# Patient Record
Sex: Female | Born: 1960 | Race: White | Hispanic: No | Marital: Married
Health system: Southern US, Community
[De-identification: ages and names within clinical notes are randomized; demographics above are authoritative.]

## PROBLEM LIST (undated history)

## (undated) ENCOUNTER — Emergency Department (HOSPITAL_COMMUNITY): Payer: Medicaid - Out of State

## (undated) DIAGNOSIS — F410 Panic disorder [episodic paroxysmal anxiety] without agoraphobia: Secondary | ICD-10-CM

## (undated) DIAGNOSIS — G8929 Other chronic pain: Secondary | ICD-10-CM

## (undated) DIAGNOSIS — I10 Essential (primary) hypertension: Secondary | ICD-10-CM

## (undated) DIAGNOSIS — E785 Hyperlipidemia, unspecified: Secondary | ICD-10-CM

## (undated) DIAGNOSIS — J45909 Unspecified asthma, uncomplicated: Secondary | ICD-10-CM

## (undated) DIAGNOSIS — E119 Type 2 diabetes mellitus without complications: Secondary | ICD-10-CM

## (undated) DIAGNOSIS — M549 Dorsalgia, unspecified: Secondary | ICD-10-CM

## (undated) DIAGNOSIS — F419 Anxiety disorder, unspecified: Secondary | ICD-10-CM

## (undated) DIAGNOSIS — R079 Chest pain, unspecified: Secondary | ICD-10-CM

## (undated) DIAGNOSIS — K589 Irritable bowel syndrome without diarrhea: Secondary | ICD-10-CM

## (undated) DIAGNOSIS — F319 Bipolar disorder, unspecified: Secondary | ICD-10-CM

## (undated) DIAGNOSIS — IMO0001 Reserved for inherently not codable concepts without codable children: Secondary | ICD-10-CM

## (undated) DIAGNOSIS — K219 Gastro-esophageal reflux disease without esophagitis: Secondary | ICD-10-CM

## (undated) DIAGNOSIS — K3184 Gastroparesis: Secondary | ICD-10-CM

## (undated) DIAGNOSIS — I4891 Unspecified atrial fibrillation: Secondary | ICD-10-CM

## (undated) HISTORY — PX: MOUTH SURGERY: SHX715

---

## 1997-09-16 HISTORY — PX: RIGHT OOPHORECTOMY: SHX2359

## 1999-09-17 HISTORY — PX: ABDOMINAL HYSTERECTOMY: SHX81

## 2004-09-07 ENCOUNTER — Encounter (INDEPENDENT_AMBULATORY_CARE_PROVIDER_SITE_OTHER): Payer: Self-pay | Admitting: *Deleted

## 2005-05-01 ENCOUNTER — Ambulatory Visit: Payer: Self-pay | Admitting: *Deleted

## 2005-05-01 ENCOUNTER — Encounter (INDEPENDENT_AMBULATORY_CARE_PROVIDER_SITE_OTHER): Payer: Self-pay | Admitting: *Deleted

## 2005-05-09 ENCOUNTER — Ambulatory Visit: Payer: Self-pay | Admitting: *Deleted

## 2005-05-09 ENCOUNTER — Encounter (HOSPITAL_COMMUNITY): Admission: RE | Admit: 2005-05-09 | Discharge: 2005-05-10 | Payer: Self-pay | Admitting: *Deleted

## 2005-05-16 ENCOUNTER — Ambulatory Visit: Payer: Self-pay | Admitting: *Deleted

## 2005-09-16 HISTORY — PX: COLONOSCOPY: SHX174

## 2005-10-17 HISTORY — PX: APPENDECTOMY: SHX54

## 2005-10-17 HISTORY — PX: CHOLECYSTECTOMY: SHX55

## 2005-10-24 ENCOUNTER — Emergency Department (HOSPITAL_COMMUNITY): Admission: EM | Admit: 2005-10-24 | Discharge: 2005-10-24 | Payer: Self-pay | Admitting: Emergency Medicine

## 2005-10-26 ENCOUNTER — Inpatient Hospital Stay (HOSPITAL_COMMUNITY): Admission: EM | Admit: 2005-10-26 | Discharge: 2005-10-30 | Payer: Self-pay | Admitting: Emergency Medicine

## 2005-10-26 ENCOUNTER — Ambulatory Visit: Payer: Self-pay | Admitting: *Deleted

## 2005-10-29 ENCOUNTER — Encounter (INDEPENDENT_AMBULATORY_CARE_PROVIDER_SITE_OTHER): Payer: Self-pay | Admitting: General Surgery

## 2006-01-05 ENCOUNTER — Observation Stay (HOSPITAL_COMMUNITY): Admission: EM | Admit: 2006-01-05 | Discharge: 2006-01-07 | Payer: Self-pay | Admitting: Emergency Medicine

## 2006-02-04 ENCOUNTER — Emergency Department (HOSPITAL_COMMUNITY): Admission: EM | Admit: 2006-02-04 | Discharge: 2006-02-04 | Payer: Self-pay | Admitting: Psychology

## 2006-02-27 ENCOUNTER — Observation Stay (HOSPITAL_COMMUNITY): Admission: EM | Admit: 2006-02-27 | Discharge: 2006-03-01 | Payer: Self-pay | Admitting: Emergency Medicine

## 2006-02-28 ENCOUNTER — Ambulatory Visit: Payer: Self-pay | Admitting: Internal Medicine

## 2006-02-28 HISTORY — PX: ESOPHAGOGASTRODUODENOSCOPY: SHX1529

## 2006-03-20 ENCOUNTER — Ambulatory Visit: Payer: Self-pay | Admitting: Family Medicine

## 2006-03-21 ENCOUNTER — Emergency Department (HOSPITAL_COMMUNITY): Admission: EM | Admit: 2006-03-21 | Discharge: 2006-03-21 | Payer: Self-pay | Admitting: Emergency Medicine

## 2006-03-31 ENCOUNTER — Ambulatory Visit: Payer: Self-pay | Admitting: Family Medicine

## 2006-04-21 ENCOUNTER — Emergency Department (HOSPITAL_COMMUNITY): Admission: EM | Admit: 2006-04-21 | Discharge: 2006-04-21 | Payer: Self-pay | Admitting: Emergency Medicine

## 2006-05-29 ENCOUNTER — Emergency Department (HOSPITAL_COMMUNITY): Admission: EM | Admit: 2006-05-29 | Discharge: 2006-05-29 | Payer: Self-pay | Admitting: Emergency Medicine

## 2006-08-02 ENCOUNTER — Emergency Department (HOSPITAL_COMMUNITY): Admission: EM | Admit: 2006-08-02 | Discharge: 2006-08-02 | Payer: Self-pay | Admitting: Emergency Medicine

## 2006-09-12 ENCOUNTER — Emergency Department (HOSPITAL_COMMUNITY): Admission: EM | Admit: 2006-09-12 | Discharge: 2006-09-12 | Payer: Self-pay | Admitting: Emergency Medicine

## 2006-09-14 ENCOUNTER — Emergency Department (HOSPITAL_COMMUNITY): Admission: EM | Admit: 2006-09-14 | Discharge: 2006-09-14 | Payer: Self-pay | Admitting: Emergency Medicine

## 2006-10-21 ENCOUNTER — Encounter: Payer: Self-pay | Admitting: Family Medicine

## 2006-10-21 DIAGNOSIS — E782 Mixed hyperlipidemia: Secondary | ICD-10-CM | POA: Insufficient documentation

## 2006-10-21 DIAGNOSIS — F329 Major depressive disorder, single episode, unspecified: Secondary | ICD-10-CM

## 2006-10-21 DIAGNOSIS — M545 Low back pain, unspecified: Secondary | ICD-10-CM | POA: Insufficient documentation

## 2006-10-21 DIAGNOSIS — G43909 Migraine, unspecified, not intractable, without status migrainosus: Secondary | ICD-10-CM | POA: Insufficient documentation

## 2006-10-21 DIAGNOSIS — F319 Bipolar disorder, unspecified: Secondary | ICD-10-CM | POA: Insufficient documentation

## 2006-10-21 DIAGNOSIS — F3289 Other specified depressive episodes: Secondary | ICD-10-CM | POA: Insufficient documentation

## 2006-10-21 DIAGNOSIS — I10 Essential (primary) hypertension: Secondary | ICD-10-CM | POA: Insufficient documentation

## 2006-10-21 DIAGNOSIS — I2510 Atherosclerotic heart disease of native coronary artery without angina pectoris: Secondary | ICD-10-CM | POA: Insufficient documentation

## 2006-10-21 DIAGNOSIS — K219 Gastro-esophageal reflux disease without esophagitis: Secondary | ICD-10-CM | POA: Insufficient documentation

## 2006-10-21 DIAGNOSIS — E785 Hyperlipidemia, unspecified: Secondary | ICD-10-CM | POA: Insufficient documentation

## 2006-10-21 DIAGNOSIS — D131 Benign neoplasm of stomach: Secondary | ICD-10-CM | POA: Insufficient documentation

## 2006-12-11 ENCOUNTER — Emergency Department (HOSPITAL_COMMUNITY): Admission: EM | Admit: 2006-12-11 | Discharge: 2006-12-11 | Payer: Self-pay | Admitting: Emergency Medicine

## 2007-01-09 ENCOUNTER — Emergency Department (HOSPITAL_COMMUNITY): Admission: EM | Admit: 2007-01-09 | Discharge: 2007-01-10 | Payer: Self-pay | Admitting: Emergency Medicine

## 2007-01-19 ENCOUNTER — Emergency Department (HOSPITAL_COMMUNITY): Admission: EM | Admit: 2007-01-19 | Discharge: 2007-01-20 | Payer: Self-pay | Admitting: Emergency Medicine

## 2007-03-17 ENCOUNTER — Emergency Department (HOSPITAL_COMMUNITY): Admission: EM | Admit: 2007-03-17 | Discharge: 2007-03-17 | Payer: Self-pay | Admitting: Emergency Medicine

## 2007-05-04 ENCOUNTER — Emergency Department (HOSPITAL_COMMUNITY): Admission: EM | Admit: 2007-05-04 | Discharge: 2007-05-04 | Payer: Self-pay | Admitting: Emergency Medicine

## 2007-12-04 ENCOUNTER — Emergency Department (HOSPITAL_COMMUNITY): Admission: EM | Admit: 2007-12-04 | Discharge: 2007-12-04 | Payer: Self-pay | Admitting: Emergency Medicine

## 2008-01-11 ENCOUNTER — Emergency Department (HOSPITAL_COMMUNITY): Admission: EM | Admit: 2008-01-11 | Discharge: 2008-01-11 | Payer: Self-pay | Admitting: Emergency Medicine

## 2008-01-11 IMAGING — CR DG ELBOW COMPLETE 3+V*L*
4 series · 4 of 4 positions shown · non-contrast
Comparison: none

CLINICAL DATA: Fall with elbow pain.
 LEFT ELBOW ? 4 VIEW:

[view not recorded (1 of 4)]
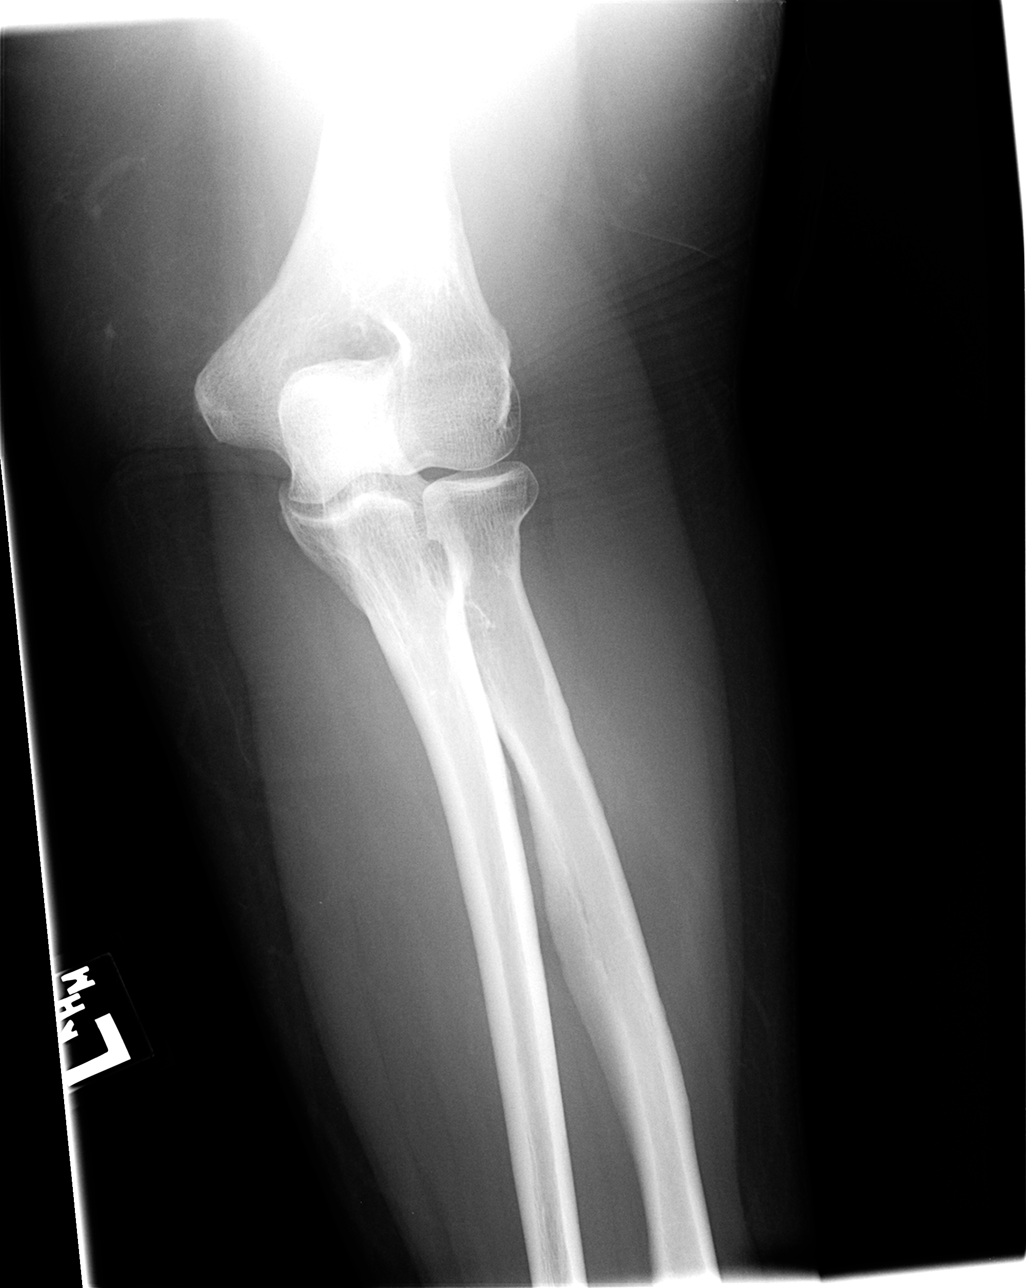

[view not recorded (2 of 4)]
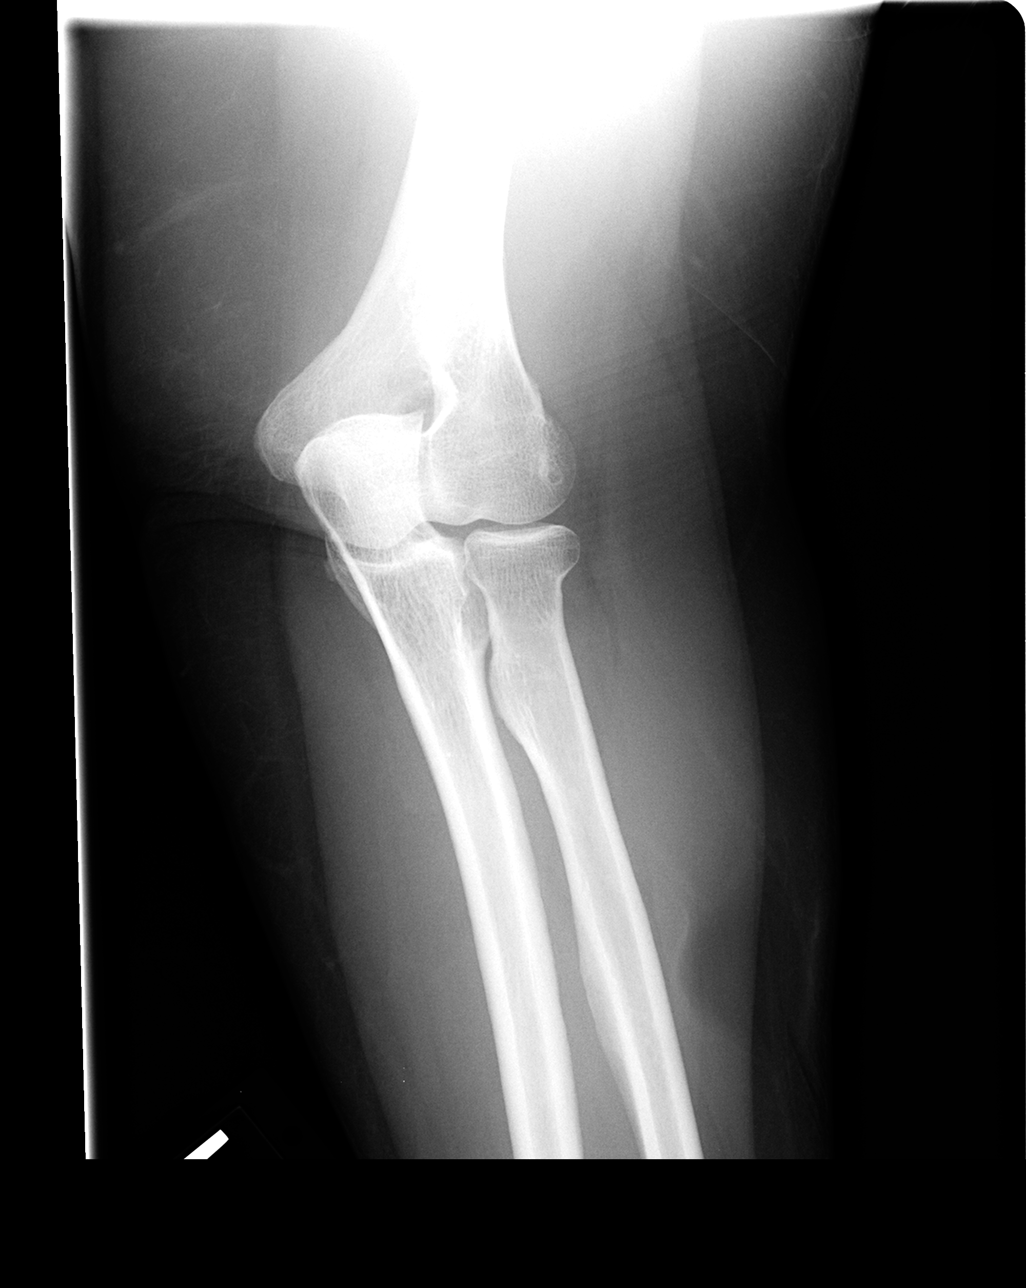

[view not recorded (3 of 4)]
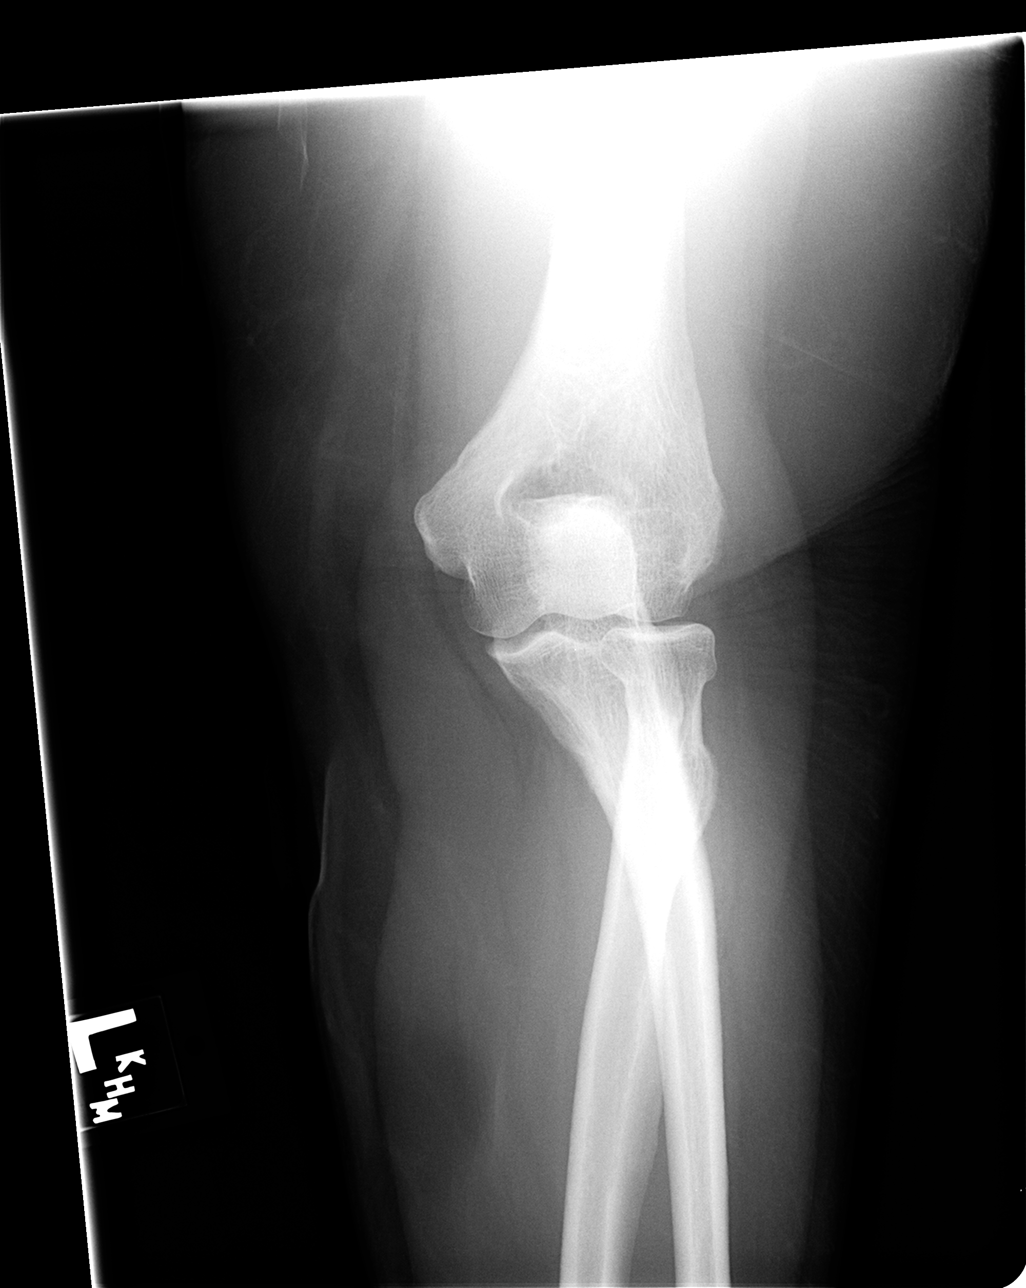

[view not recorded (4 of 4)]
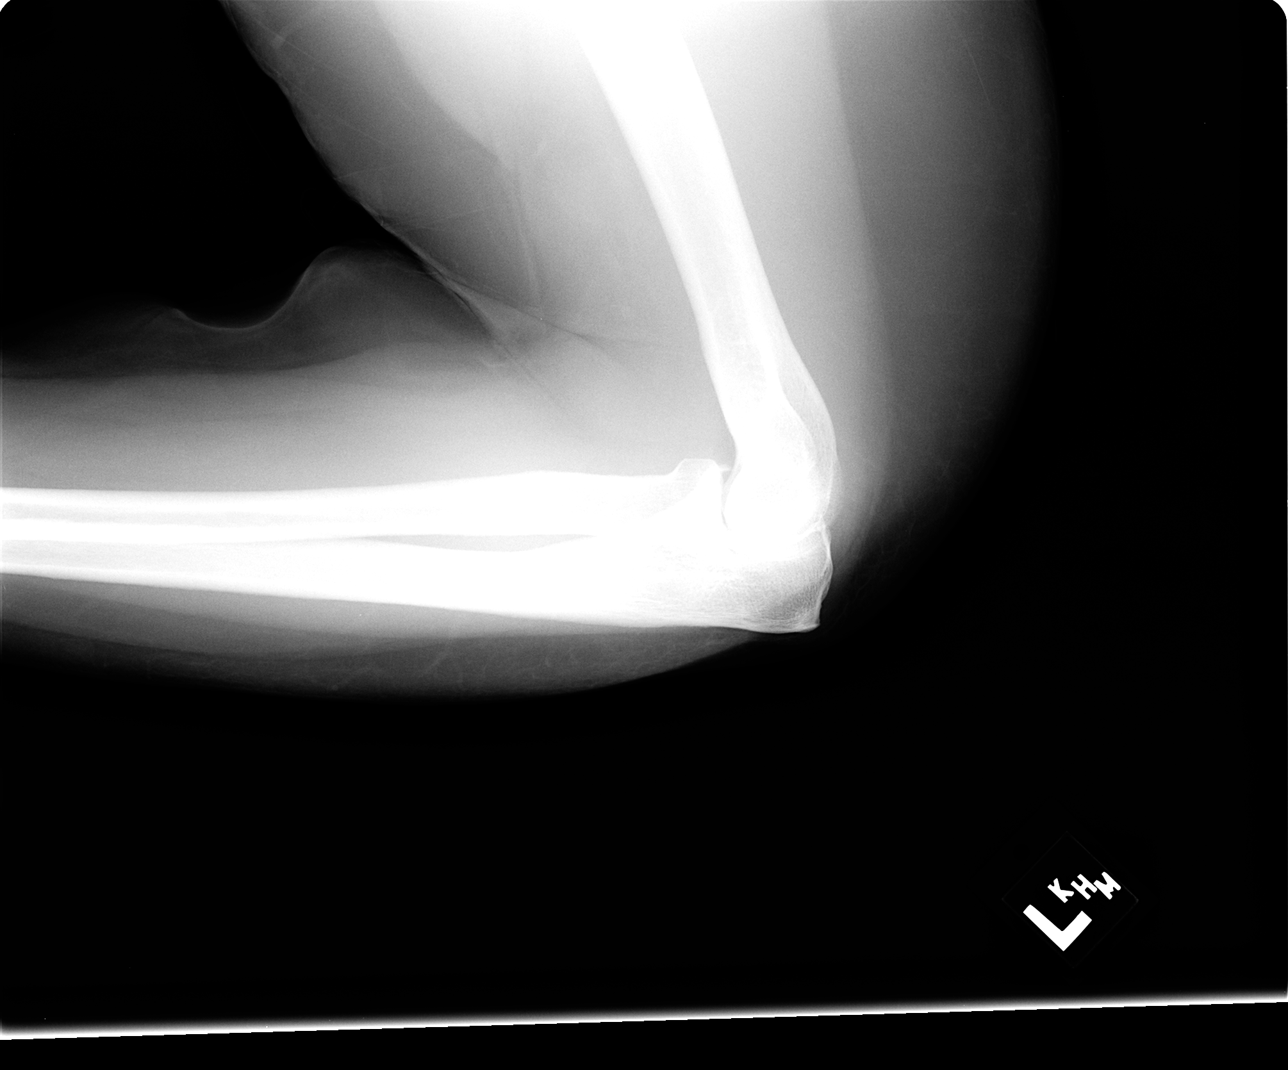

[4 of 4 positions shown; findings below may reference images not displayed]

FINDINGS: There is no evidence of fracture, dislocation, or joint effusion.  There is no evidence of arthropathy or other focal bone abnormality.  Soft tissues are unremarkable.
IMPRESSION: Negative.

## 2008-02-27 ENCOUNTER — Emergency Department (HOSPITAL_COMMUNITY): Admission: EM | Admit: 2008-02-27 | Discharge: 2008-02-27 | Payer: Self-pay | Admitting: Emergency Medicine

## 2010-06-10 ENCOUNTER — Emergency Department (HOSPITAL_COMMUNITY): Admission: EM | Admit: 2010-06-10 | Discharge: 2010-06-10 | Payer: Self-pay | Admitting: Emergency Medicine

## 2010-10-07 ENCOUNTER — Encounter: Payer: Self-pay | Admitting: Family Medicine

## 2010-11-29 LAB — COMPREHENSIVE METABOLIC PANEL
AST: 11 U/L (ref 0–37)
Albumin: 3.4 g/dL — ABNORMAL LOW (ref 3.5–5.2)
Calcium: 8.6 mg/dL (ref 8.4–10.5)
Chloride: 99 mEq/L (ref 96–112)
Creatinine, Ser: 0.92 mg/dL (ref 0.4–1.2)
GFR calc Af Amer: >60 mL/min (ref >60)

## 2010-11-29 LAB — CK TOTAL AND CKMB (NOT AT ARMC)
CK, MB: 0.5 ng/mL (ref 0.3–4.0)
Relative Index: INVALID (ref 0.0–2.5)

## 2010-11-29 LAB — CBC
Hemoglobin: 10.6 g/dL — ABNORMAL LOW (ref 12.0–15.0)
MCV: 82.8 fL (ref 78.0–100.0)
Platelets: 228 10*3/uL (ref 150–400)
RBC: 3.86 MIL/uL — ABNORMAL LOW (ref 3.87–5.11)
WBC: 5.2 10*3/uL (ref 4.0–10.5)

## 2011-02-01 NOTE — Discharge Summary (Signed)
NAMESHAMBRIA, [name]                   ACCOUNT NO.:  [id number]   MEDICAL RECORD NO.:  [id number]          PATIENT TYPE:  OBV   LOCATION:  A226                          FACILITY:  [facility]   PHYSICIAN:  Osvaldo Shipper, MD     DATE OF BIRTH:  1961-06-19   DATE OF ADMISSION:  01/05/2006  DATE OF DISCHARGE:  04/24/2007LH                                 DISCHARGE SUMMARY   The patient goes to [facility].   DISCHARGE DIAGNOSES:  1.  Chest pain likely secondary to acid reflux disease versus      musculoskeletal.  2.  Syncopal episode likely related to medication use.  3.  Bipolar disorder, stable.  4.  Headaches, stable.   HISTORY OF PRESENT ILLNESS:  Please see the H&P dictated at the time of  admission for details regarding the patient's presenting illness.   BRIEF HOSPITAL COURSE:  1.  Briefly, this is a 50 year old Caucasian female who has history of      bipolar disorder and a strong family history of premature coronary      artery disease who has been having chest pain on and off for at least      the past 9 months.  She had a stress test back in August 2006 which was      unremarkable.  The patient subsequently presented to [facility] about 2 weeks ago with chest pain and because of her      concerning presentation and her family history she actually underwent      cardiac catheterization which did not reveal any coronary artery      disease.  The patient's echocardiogram was also unremarkable.  The      patient had continued to have chest pain since then.  About 1 day prior      to admission here she had two episodes of syncopal episodes.  Her EKG      actually showed Q waves in the inferior leads.  A D-dimer was checked      which was negative.  However, because of her recent negative cardiac      catheterization it was thought that the patient would benefit from      getting a CT of chest with contrast to rule out any other  abnormalities      such as a PE or any major vascular abnormalities.  CT chest was done      which was again unremarkable.  In a sense, the patient has had an      extensive work up for her chest pain which has not revealed any      conclusive etiology.  She does have history of GERD.  She also says that      she may have fallen down on her chest when she had the syncopal      episodes.  Hence, these etiologies need to be pursued further.  I am      going to refer the patient to Dr. Karilyn Cota to consider endoscopy on  this      patient.  She may also benefit from esophageal manometrics.  2.  Syncopal episodes.  The patient was admitted to telemetry.  For the past      2 days there have been no arrhythmic evidence noted on the telemetry.      She has ruled out for acute coronary syndrome.  Her CT head was also      quite unremarkable for any acute event.  Carotid Dopplers did not show      any significant stenosis.  It was thought this could be related to      medication use versus seizures.  Arrhythmic event is still a      possibility.  The patient was seen by Dr. Gerilyn Pilgrim who recommends      outpatient EEG.  We will also discontinue the patient's metoprolol at      this time.  I am going to refer her to [facility] to get an      event monitor.  3.  Headache, possibly related to her fall during which she sustained      laceration to her left forehead.  This was sutured in the [facility] ED.      CT has not revealed any acute intracranial or bony abnormalities.  Pain      is being controlled with Vicodin.  4.  Bipolar disorder has been stable.  She has an appointment with her      psychiatrist later today.  I have asked her to discuss with the      psychiatrist if the dosage of any of her medications can be reduced in      view of her syncopal episodes.   On the day of discharge the patient still has a mild headache but no chest  pain.  Her vital signs are all stable.  As mentioned  above telemetry did not  show any arrhythmic events.  Her blood work shows stability in her anemia  and no other abnormalities.  Based on all the above the patient is  considered stable for discharge.   DISCHARGE MEDICATIONS:  I will discontinue metoprolol.  I am starting her on  Nu-Iron 150 b.i.d.  Otherwise she may resume her other medications as  before.   FOLLOW UP:  1.  Followup with Dr. Gerilyn Pilgrim in 2 weeks for an EEG.  2.  With Dr. Karilyn Cota to consider endoscopy.  3.  [facility] for event monitor.   DISCHARGE DIET:  Heart healthy diet.   DISCHARGE ACTIVITIES:  Physical activity, no restrictions.   The patient is asked to return to [facility] ED per their instructions for  suture removal.   Apart from the imaging studies mentioned above the patient also had a lower  extremity venous Dopplers which did not reveal a DVT.  She also had x-rays  of her elbow which was negative for any fracture or dislocation.   CONSULTANTS:  Obtained from Dr. Gerilyn Pilgrim.      Osvaldo Shipper, MD  Electronically Signed     GK/MEDQ  D:  01/07/2006  T:  01/07/2006  Job:  [id number]   cc:   Darleen Crocker A. Gerilyn Pilgrim, M.D.  Fax: [phone]   Lionel December, M.D.  [address]  [place]  [place] [address]   Country Walk Bing, M.D. [facility]  [address]  [address]  [place]  [place] [address]

## 2011-02-01 NOTE — Op Note (Signed)
[name], [name]                   ACCOUNT NO.:  [id number]   MEDICAL RECORD NO.:  [id number]          PATIENT TYPE:  INP   LOCATION:  A217                          FACILITY:  [facility]   PHYSICIAN:  Dirk Dress. Katrinka Blazing, M.D.   DATE OF BIRTH:  08/12/1961   DATE OF PROCEDURE:  10/29/2005  DATE OF DISCHARGE:  10/30/2005                                 OPERATIVE REPORT   PREOPERATIVE DIAGNOSIS:  Acalculous cholecystitis, chronic right lower  quadrant pain.   POSTOPERATIVE DIAGNOSIS:  Acalculous cholecystitis, chronic right lower  quadrant pain, pelvic adhesions.   PROCEDURE:  Laparoscopic cholecystectomy, pelvic adhesiolysis, laparoscopic  appendectomy.   SURGEON:  Dr. Katrinka Blazing.   DESCRIPTION:  Under general anesthesia the patient's abdomen was prepped and  draped in sterile field.  Supraumbilical incision was made and Veress needle  was inserted uneventfully.  Abdomen was insufflated with 2.5 liters of CO2.  Using an Visiport guide, a 10 mm port was placed.  Laparoscope was placed  and the gallbladder was visualized.  Under videoscopic guidance a 10-mm port  and two 5-mm ports were placed in the right subcostal region.  The patient  was placed in deep Trendelenburg position.  Evaluation of the pelvis  revealed extensive pelvic adhesions and revealed the appendix that was  slightly enlarged with increased vascularity.  It was elected to do a  cholecystectomy and appendectomy.  The patient was placed back in reverse  Trendelenburg position.  Gallbladder was grasped and positioned.  Cystic  duct was dissected, clipped with five clips and divided.  Cystic artery was  dissected, clipped with three clips on each branch and divided.  Gallbladder  was separated from the infrahepatic space without difficulty.  It was placed  in an EndoCatch device and retrieved.  Irrigation was carried out.  There  was no bleeding or bile leak.  The patient was then placed in deep  Trendelenburg position.  Suprapubic  incision was made and 5 mm port was  placed.  Using the camera in the right upper paramedian incision, dissection  was carried out through the umbilicus and the suprapubic port.  Adhesiolysis  was carried out using Endoshears.  Once this was done the pelvis was freed  and no other abnormality was noted.  The appendix was isolated.  It was  grasped with a clamp.  12 mm port was placed in the left lower quadrant.  The base of the appendix was dissected and transected using an Endo-GIA.  The mesoappendix was dissected and clipped with multiple clips.  The  appendix was placed in an EndoCatch device and retrieved.  Irrigation was  carried out.  The fluid returned clear.  The subhepatic incision was  inspected.  All areas appeared to be free of any drainage.  CO2 was allowed  to escape from the abdomen and ports were removed.  The port at the  umbilicus and the port in left lower quadrant had the fascia closed with 0  Vicryl.  All skin incisions were closed with staples.  The patient tolerated  the procedure well.  She was  awakened from anesthesia.  Dressings were  placed.  She was awakened from anesthesia uneventfully, transferred to a bed  and taken to the postanesthetic care unit for monitoring.      Dirk Dress. Katrinka Blazing, M.D.  Electronically Signed     LCS/MEDQ  D:  02/01/2006  T:  02/02/2006  Job:  [id number]

## 2011-02-01 NOTE — Procedures (Signed)
[name], [name]                   ACCOUNT NO.:  [id number]   MEDICAL RECORD NO.:  [id number]          PATIENT TYPE:  INP   LOCATION:  A217                          FACILITY:  [facility]   PHYSICIAN:  Vida Roller, M.D.   DATE OF BIRTH:  02-24-1961   DATE OF PROCEDURE:  10/28/2005  DATE OF DISCHARGE:                                  ECHOCARDIOGRAM   TAPE NUMBER:  [id number]   TAPE COUNT:  [id number]   HISTORY OF PRESENT ILLNESS:  This is a 50 year old woman with chest  discomfort.  Technical quality of this study is adequate.   M-MODE TRACINGS:  The aorta is 26 mm.   Left atrium is 44 mm.   Septum is 10 mm.   Posterior wall is 10 mm.   Left ventricular diastolic dimension 46 mm.   Left ventricular systolic dimension 33 mm.   A 2D AND DOPPLER IMAGING:  Left ventricle is normal size.  There is  preserved LV systolic function.  Estimated ejection fraction is 60-65%.  No  wall motion abnormality is seen.   Right ventricle is top normal size with preserved RV systolic function.   Both atria are dilated.   Aortic valve is morphologically unremarkable with no stenosis or  regurgitation.   The mitral valve has trace regurgitation.   The tricuspid valve has mild regurgitation.   There is no pericardial effusion.      Vida Roller, M.D.  Electronically Signed     JH/MEDQ  D:  10/28/2005  T:  10/29/2005  Job:  [id number]

## 2011-02-01 NOTE — Procedures (Signed)
[name], [name]                   ACCOUNT NO.:  [id number]   MEDICAL RECORD NO.:  [id number]          PATIENT TYPE:  REC   LOCATION:                                FACILITY:  [facility]   PHYSICIAN:  Vida Roller, M.D.   DATE OF BIRTH:  September 20, 1960   DATE OF PROCEDURE:  DATE OF DISCHARGE:                                    STRESS TEST   This is a 50 year old female with no known coronary disease with atypical  chest discomfort. Cardiac risk factors include strong family history,  hypertension, and hyperlipidemia.   BASELINE DATA:  Electrocardiogram reveals a sinus rhythm at 94 beats per  minute with nonspecific ST abnormalities. Some nondiagnostic Q waves in the  inferolateral leads, and blood pressure is 110/62.   The patient exercised for a total of 3 minutes and 32 seconds and Bruce  protocol stage 1 to 4.6 METS. Maximum heart rate was 173 beats per minute  which is 97% of predicted maximum. Maximum blood pressure is 188/60. The  patient has complained of some shortness of breath. She had some mild  dyspnea at the end of exercise after the treadmill was stopped. This  resolved in recovery. Her EKG revealed few PACs. No ischemic changes were  noted. Exercise was stopped secondary to fatigue.   Final images and results are pending M.D. review.      Jae Dire, P.A. [facility]      Vida Roller, M.D.  Electronically Signed    AB/MEDQ  D:  05/09/2005  T:  05/09/2005  Job:  [id number]

## 2011-02-01 NOTE — Discharge Summary (Signed)
NAMECHARLA, [name]                   ACCOUNT NO.:  [id number]   MEDICAL RECORD NO.:  [id number]          PATIENT TYPE:  INP   LOCATION:  A217                          FACILITY:  [facility]   PHYSICIAN:  Dirk Dress. Katrinka Blazing, M.D.   DATE OF BIRTH:  14-Oct-1960   DATE OF ADMISSION:  10/26/2005  DATE OF DISCHARGE:  02/14/2007LH                                 DISCHARGE SUMMARY   HISTORY OF PRESENT ILLNESS:  A 50 year old female admitted for evaluation of  recurrent abdominal pain, nausea, and vomiting.  She gives a history of  acute onset of nausea with diarrhea on February 3.  The pain was in the  right upper quadrant.  She was seen in the emergency room at [facility] on February 5, treated with IV fluids and analgesics.  She  never improved.  She was seen by her family physician at [facility] who treated her symptomatically.  She had a CT scan and ultrasound at  [facility] but was told the results of these were negative.  She continued to be symptomatic so she came to our emergency room.  When  seen in the emergency room she was noted to be in severe pain radiating  through to her back.  She was treated symptomatically and released.  She  continued to have pain with nausea, vomiting and was seen on the day of  admission where a CT was done and this was negative.  Repeat ultrasound was  negative.  She was admitted because she appeared to be in severe distress  and she appeared to have nausea with vomiting.  She gives a history that her  pain is always worse with meals.  History is positive for hypertension,  hyperlipidemia, bipolar disorder and history of atypical chest pain with  negative stress test.  On examination she had a very flat affect.  Vital  signs were stable.  Temperature was 97.7.  She was not jaundiced.  Chest was  clear.  Heart was regular.  Abdomen was mildly distended with exquisite  tenderness in the right subcostal region with tenderness  in the right lower  quadrant at the level of the iliac crest.  It was felt that the patient had  symptoms suggestive of severe biliary kinesia or acalculous cholecystitis  even though her studies were negative.  It was felt that a Hiatus scan  should be done.  The hiatus scan was positive with ejection fraction of  24.2%.  The patient was scheduled for laparoscopic evaluation.  She  underwent diagnostic laparoscopy with pelvic adhesiolysis, appendectomy and  cholecystectomy.  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  The patient  did exceptionally well in the postoperative period.  All of her symptoms  resolved.  She had no abdominal pain and no nausea or vomiting.  And she  tolerated a regular diet.  She was discharged on the morning of the first  postoperative day in satisfactory condition.      Dirk Dress. Katrinka Blazing, M.D.  Electronically  Signed     LCS/MEDQ  D:  12/15/2005  T:  12/16/2005  Job:  [id number]   cc:   Bethena Midget, M.D., [facility]

## 2011-02-01 NOTE — Op Note (Signed)
[name], [name]                   ACCOUNT NO.:  [id number]   MEDICAL RECORD NO.:  [id number]          PATIENT TYPE:  INP   LOCATION:  A214                          FACILITY:  [facility]   PHYSICIAN:  Lionel December, M.D.    DATE OF BIRTH:  1961/02/07   DATE OF PROCEDURE:  02/28/2006  DATE OF DISCHARGE:                                 OPERATIVE REPORT   PROCEDURE:  Esophagogastroduodenoscopy with placement of Bravo device for pH  study.   INDICATIONS:  [name] is a 50 year old Caucasian female with recurrent chest  pain. Noninvasive cardiac studies have been negative.  She has GERD and is  on Nexium which has not help with this pain.  She is undergoing diagnostic  EGD.  If her esophageal mucosa is normal, a Bravo device will be placed for  pH study.  The procedure risks were reviewed with the patient and informed  consent was obtained.   MEDS FOR CONSCIOUS SEDATION:  Benzocaine spray pharyngeal topical  anesthesia, Demerol 50 mg IV, Versed 7 mg IV.   FINDINGS:  The procedure was performed in the endoscopy suite.  The  patient's vital signs and O2 sat were monitored during the procedure and  remained stable.  The patient was placed in the left lateral position.  The  Olympus videoscope was passed via oropharynx without any difficulty into  esophagus.  Mucosa of the pharynx was very dry.   Esophagus:  The mucosa of the esophagus was normal.  The GE junction was at  39 cm from the incisors and was unremarkable.   Stomach:  The stomach had a scant amount of liquid in it.  It distended very  well with insufflation.  There was a 10-15 mL polyp at the gastric body  along the posterior wall with erosion.  There was a cluster of polyps at the  antrum.  Endoscopically, these polyps appeared to be hyperplastic and were  not removed and/or biopsied.  The pyloric channel was patent.  Angularis,  fundus and cardia were examined by retroflexing the scope and were normal.   Duodenum:  The bulbar mucosa  was normal.  The scope was passed second part  of duodenum where mucosa and folds were normal.   The endoscope was withdrawn.  The Bravo device was already loaded onto a  delivery catheter.  It was calibrated and passed blindly via oropharynx and  esophagus.  It was connected to a suction device for 30 seconds.  The  plunger was pushed to secure the device in the esophageal mucosa.  The  delivery catheter was removed.  We determined that the device was placed  more distal than it should have been.  The endoscope was passed again and  the device was at the distal esophagus and the proximal margin was just  proximal to the GE junction.  Therefore, it was decided to place another  device.  I tried to shape the device for the scope tip and the a snare but  it would not fall off, therefore, it was left alone.  The endoscope was  withdrawn.  A second Bravo device placed at 33 cm from the incisors.  This  was deployed in a similar fashion.  As the delivery catheter was withdrawn,  the endoscope was passed again and this device was in good position above  the other device.  The endoscope was withdrawn.  The patient tolerated the  procedure well.   FINAL DIAGNOSIS:  1.  No endoscopic evidence of esophagitis.  2.  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  3.  Normal examination of the bulb and postbulbar duodenum.  4.  The first Bravo device was inadvertently placed in the distal esophagus.      Therefore, a second device was placed 6 cm proximal to the GE junction.   RECOMMENDATIONS:  1.  She will resume her usual meds including PPI.  2.  We will advance the diet to a 4 grams sodium diet.  3.  The patient will keep symptom diary and will bring the devices back to      this facility on March 03, 2006.      Lionel December, M.D.  Electronically Signed     NR/MEDQ  D:  02/28/2006  T:  02/28/2006  Job:   [id number]   cc:   Hollice Espy, M.D.

## 2011-02-01 NOTE — H&P (Signed)
NAMEVAEDA, [name]                   ACCOUNT NO.:  [id number]   MEDICAL RECORD NO.:  [id number]          PATIENT TYPE:  INP   LOCATION:  A214                          FACILITY:  [facility]   PHYSICIAN:  Lonia Blood, M.D.      DATE OF BIRTH:  April 06, 1961   DATE OF ADMISSION:  02/27/2006  DATE OF DISCHARGE:  LH                                HISTORY & PHYSICAL   PRIMARY CARE PHYSICIAN:  Theone Stanley, M.D. at [facility].   CHIEF COMPLAINT:  Retrosternal chest pain.   HISTORY OF PRESENT ILLNESS:  The patient is a 50 year old white female with  recurrent admissions and hospital visits almost five times this year alone,  complaining mainly of chest pain all the time.  She has had significant  family history for heart disease and also has risk factors for heart  disease, however, she has had workup before multiple times including a  cardiac catheterization that was essentially negative for coronary artery  disease.  She was last in the hospital on January 05, 2006, to January 07, 2006.  At that time she had a syncopal episode.  She had MI ruled out and also PE  ruled out.  The patient was asked to follow up with Lionel December, M.D. for  possible EGD since she has acid reflux disease.  The patient, however, has  not followed up with him.  Since then she has been to the ED twice with  chest pain.  This episode started today.  There was sharp, but more  pressure, she said in the anterior part of the chest, radiating to her back.  She has had some nausea associated with it, but no diaphoresis.  She has not  vomited.  The patient has been less active, although, she takes care of her  ailing husband.   PAST MEDICAL HISTORY:  Significant history of chest pain which is recurrent  of unknown etiology.  History of hypertension, dyslipidemia, obesity,  bipolar disorder, gastroesophageal reflux disease, history of syncopal  episode in April of this year.   ALLERGIES:  CODEINE, IBUPROFEN  causes GI intolerance and itching.   PAST SURGICAL HISTORY:  Hysterectomy, right oophorectomy for tubal ligation.  Recent exploratory laparotomy in February of 2007 at which point she had  laparoscopic cholecystectomy by Dirk Dress. Katrinka Blazing, M.D.   SOCIAL HISTORY:  The patient lives in [place].  She is married.  She is  disabled and is primary caregiver for her husband.  No history of tobacco or  alcohol use.  No IV drug use.   FAMILY HISTORY:  Significant for her father dying from coronary artery  disease.  He started having problems at the age of 46 and died at the age of  66 from a massive heart attack.  One of her brothers has coronary artery  disease and is status post CABG.  Also history of diabetes and CVA in the  family.  Mother has significant reflux disease with multiple EGD's done.  One of her sisters had to have esophageal dilatation performed.  Other  family members have extensive coronary artery disease history.   REVIEW OF SYSTEMS:  A 12-point review of systems is performed and is mainly  per HPI.   PHYSICAL EXAMINATION:  VITAL SIGNS:  The patient was afebrile.  Temperature  98, blood pressure initially 152/80, pulse 71, respiratory rate 18,  saturations 100% on room air.  GENERAL:  The patient is obese, awake, alert, oriented, in no acute  distress.  HEENT:  PERRL, EOMI.  NECK:  Supple, no JVD and no lymphadenopathy.  LUNGS:  She has good air entry bilaterally.  No wheezes or rales.  HEART:  Regular rate and rhythm.  ABDOMEN:  Obese, soft, and nontender with positive bowel sounds.  EXTREMITIES:  No cyanosis, clubbing, or edema.   LABORATORY DATA:  White count 5.1, hemoglobin 11.3, MCV 84.8.  Platelet  count 254 with normal differential.  Initial cardiac enzymes were all  negative.  Sodium 139, potassium 3.5, chloride 103, CO2 27, glucose 132, BUN  9, creatinine 1.0, calcium 8.9, total protein 6.7, albumin 3.4, AST 34, ALT  30, alkaline phosphatase 55, total bilirubin  0.4.  Her BMP is less than 30.  EKG is essentially unchanged from previous EKG.   Chest x-ray is also negative for any acute disease.   ASSESSMENT:  This is a 50 year old female well-known to our service  presenting with recurrent chest pain.  Previous workup for her chest pain  has ruled out any cardiac disease.  The patient most likely has GI related  chest pain, but she has extensive risk factors for cardiac disease.   PLAN:  1.  Chest pain.  We will try to rule out myocardial infarction again even      though she has had recent catheterization.  We will do serial cardiac      enzymes.  We will give her some baby aspirin and some nitroglycerin,      although, it has not helped much so far.  We will also keep her on beta      blocker.  More importantly we will try to get the patient some GI workup      this time around, especially with her symptoms radiating to the back and      the fact that she has been unable to follow up with Dr. Karilyn Cota as an      outpatient.  I will consult him to see if she can get an EGD and further      workup for this retrosternal chest pain.  2.  Gastroesophageal reflux disease.  I will keep her on PPI twice a day      until she is seen by gastroenterology.  3.  Dyslipidemia.  I will continue with her Vytorin.  She is also supposedly      on TriCor.  4.  Hypertension.  Her blood pressure has responded well on her home      medications, so we will make no changes at this point.  5.  Normocytic anemia.  This is mild in a relatively young individual.  It      may be related to her menstruation.  We will follow her hemoglobin      closely in the hospital.  If it drops further, we will do a workup for      anemia.  6.  Bipolar disorder.  I will continue with her home medications also      without much change.      Lonia Blood, M.D.  Electronically Signed    LG/MEDQ  D:  02/27/2006  T:  02/27/2006  Job:  [id number]

## 2011-02-01 NOTE — Consult Note (Signed)
NAMEMYLO, DRISKILL                   ACCOUNT NO.:  [id number]   MEDICAL RECORD NO.:  [id number]          PATIENT TYPE:  OBV   LOCATION:  A214                          FACILITY:  [facility]   PHYSICIAN:  Lionel December, M.D.    DATE OF BIRTH:  1961-07-23   DATE OF CONSULTATION:  02/28/2006  DATE OF DISCHARGE:                                   CONSULTATION   REASON FOR CONSULTATION:  Recurrent chest pain.   HISTORY OF PRESENT ILLNESS:  [name] is a 50 year old Caucasian female who was  admitted to Dr. Chancy Milroy service last evening with prolonged episode of  chest pain.  Apparently she had chest pain for several months.  Her workup  in the past had been negative.  She had exercise tolerance test last year.  She also had negative CT.  She also has had an echocardiography and was  recently hospitalized in April with chest pain and ruled out for an MI and  PE.  Appointment was made for her to be followed in our office, but she did  not show up.  She has chest pain a couple of times a week.  It is  retrosternal radiating posteriorly.  She describes it as pressure and a dull  sensation associated with nausea.  She has not had vomiting or postural  symptoms.  She states she passed out prior to her last visit.  She states  Nexium controls her heart burn and regurgitation, but does not seem to help  with the chest pain.  She also complains of intermittent dysphagia, and  points to her throat as the site of bolus obstruction.  She has tried  nitroglycerin on a few occasions which seemed to ease this pain.  This pain  does not appear to be triggered with meals or walking, etcetera.  She does  not have a good appetite, although she has maintained her weight over the  last 1 year.  She denies abdominal pain, melena or rectal bleeding.  This  admission she had 4 troponin levels all of which been well within normal  range.   MEDICATIONS:  1.  She is presently on atenolol 25 mg daily.  2.  Atenolol 50 mg  daily.  3.  ASA 325 mg daily.  4.  Depakote 500 mg t.i.d.  5.  Cymbalta 120 mg daily.  6.  Zetia 10 mg daily.  7.  Protonix 40 mg b.i.d.  8.  Seroquel 400 mg daily.  9.  Zocor 40 mg daily.  10. Desyrel 100 mg q.h.s.  11. Dilaudid 0.5 mg IV q.4 h. p.r.n. pain.   PAST MEDICAL HISTORY:  Medical problems include:  1.  Obesity.  2.  Hypertension of several years duration.  3.  Hyperlipidemia.  4.  Chronic GERD.  5.  Bipolar disorder was diagnosed 10 years ago, and she feels this is well-      controlled.  6.  She had tubal ligation 1986.  7.  Hysterectomy in 2001.  8.  She had right oophorectomy in 1999.  9.  Hysterectomy in 2001.  10.  She had cholecystectomy and appendectomy in the February 2007, at that      time she was having right-sided pain.   ALLERGIES:  To CODEINE which causes nausea, vomiting and IBUPROFEN causes  stomach burning.   FAMILY HISTORY:  Father had CAD in his 57s was also hypertensive, and had  cirrhosis, as well as hypertension and died at 23.  Mother was diabetic had  CAD and cirrhosis and died at 54.  She believes he was cirrhotic as well.  She has 2 brothers and 2 sisters and they are not doing well.  One sister  had surgery, yesterday, she has a left carotid stenosis an intracranial  aneurysm; she is 18.  The other sister, age 60, has DM, cirrhosis and has  had one of her legs amputated.  One brother, age 82, has cirrhosis; another  brother, age 82, has had CABG, CVA, and he is diabetic as well as bipolar.   SOCIAL HISTORY:  She has been married for 26 years.  She has 4 children in  good health.  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  She does not smoke cigarettes or drink alcohol.   PHYSICAL EXAM:  GENERAL:  A pleasant, morbidly obese, Caucasian female who  was in no acute distress.  VITAL SIGNS: She weighs 292.2 pounds.  She is 63 inches tall.  Pulse 71 per  minute, blood pressure 133/70, temperature 97.7 and respiratory  rate is 20.  HEENT:  Conjunctivae are pink.  Sclerae are nonicteric.  Oral pharyngeal  mucosa is normal.  Dentition is very poor condition, some of the teeth were  carious.  NECK:  No masses or thyromegaly noted.  CARDIAC EXAM:  With regular rhythm.  Normal S1-S2, no murmur or gallop  noted.  LUNGS:  Clear to auscultation.  No chest wall tenderness noted.  Abdomen:  Obese.  Bowel sounds are normal. On palpation it is soft with mild  tenderness in the mid epigastrium.  Liver edge is 3-4 cm below RCM.  RECTAL EXAMINATION:  Deferred.  EXTREMITIES:  She has trace edema around the ankles.   LABS FROM ADMISSION:  WBC 5.1, H&H is 11.3 and 33.5, platelet count 254,000.  MCV is 84.8 sodium 139, potassium 3.5, chloride 103, CO2 27, glucose 132,  BUN 9, creatinine 1, bilirubin 0.4, AP 55, AST 34, ALT 30, total protein 6.3  with albumin of 3.4, calcium 8.9.  Troponin levels x4 have been normal.   ASSESSMENT:  [name] is a 50 year old Caucasian female with chronic  gastroesophageal reflux disease, who presents with recurrent retrosternal  pain.  She is on A PPI therapy which was helped her heartburn and  regurgitation.  Noninvasive cardiac evaluation is negative; and she has,  once again, been ruled out for myocardial infarct.  She also has bipolar  disorder and fairly well controlled with therapy.   [name] has atypical chest pain.  It remains to be seen whether this is due to  gastroesophageal reflux disease or esophageal spasm.  The fact that she had  some relief with nitroglycerin may point to later diagnosis.   RECOMMENDATIONS:  Diagnostic esophagogastroduodenoscopy.  If EGD is normal.  Will proceed with Bravo device for a 48-hour pH study.  I have reviewed the  procedure and risks with the patient; and she is agreeable.   We would like to thank Dr. Rito Ehrlich for the opportunity to participate in  the care of this nice lady.  Lionel December, M.D.  Electronically Signed     NR/MEDQ  D:   02/28/2006  T:  02/28/2006  Job:  [id number]

## 2011-02-01 NOTE — H&P (Signed)
[name], [name]                   ACCOUNT NO.:  [id number]   MEDICAL RECORD NO.:  [id number]          PATIENT TYPE:  EMS   LOCATION:  ED                            FACILITY:  [facility]   PHYSICIAN:  Osvaldo Shipper, MD     DATE OF BIRTH:  10/20/1960   DATE OF ADMISSION:  10/26/2005  DATE OF DISCHARGE:  LH                                HISTORY & PHYSICAL   PRIMARY DOCTOR:  Dr. Bethena Midget at [facility].   ADMITTING DIAGNOSES:  1.  Right upper quadrant and right lower quadrant abdominal pain, etiology      unclear.  2.  History of chest pain.  3.  Hypertension.  4.  Dyslipidemia.  5.  Morbid obesity.  6.  Acid reflux disease.  7.  Bipolar disorder.   CHIEF COMPLAINT:  Abdominal pain for one week.   HISTORY OF PRESENT ILLNESS:  The patient is a 50 year old Caucasian female  with medical problems as outlined above who presented to the ED initially on  February 8th with complaints of abdominal pain.  The pain started about one  week ago according to the patient.  She was fine prior to the onset of pain.  The pain is located in the right upper quadrant as well as in the right  lower quadrant.  It seems to radiate down to the right lower quadrant.  The  pain was 10/10 in intensity initially.  Currently, after analgesic agents,  the pain is better controlled.  This pain especially increased after food  intake.  The patient also gives a history of significant nausea and vomiting  ever since her complaint started.  She has not been able to keep anything  down in the past seven days as well.  She also gives a history of on-and-off  diarrhea, which is described as brown and very watery, also in the last few  days.  No history of any suggestion of hematemesis, melena or hematochezia.  The patient also gives history suggestive of bloating and fullness of the  stomach.  She says she might have lost about 2 pounds in the last seven  days.  Otherwise, her weight has been stable.  Prior to  one week ago, she  had never had such problems in the past.  The patient also has acid reflux  disease; however, she says this pain does not feel like that.  She does not  give history of any fever or chills at home.   The patient then mentioned that she has been having some chest pain as well.  This is located in the left side of the chest wall.  She has had this pain  on and off for the past four days.  The last onset was this morning when she  was sitting down in her kitchen.  It lasted about 20 minutes and resolved by  itself without any medication.  The pain is described as a tightness and,  again, present on and off.  She also gives a history of some shortness of  breath along with those  chest symptoms.   MEDICATIONS AT HOME:  1.  Depakote 500 mg t.i.d.  2.  Seroquel 100 mg q.h.s.  3.  Clonazepam 0.4 mg q.i.d.  4.  Cymbalta 60 mg q.a.m.  5.  Lisinopril/hydrochlorothiazide 20/25 mg once daily.  6.  Vytorin 10/40 mg once daily.  7.  Nexium 40 mg once daily.   ALLERGIES:  Allergic to Tylenol w/Codeine #3, which causes itching.  She has  intolerance to aspirin and Motrin as they upset her stomach.   PAST MEDICAL HISTORY:  1.  Hypertension.  2.  Dyslipidemia.  3.  She apparently has a cyst on her left ovary.  4.  She has bipolar disorder.  5.  She has morbid obesity.  6.  She also has some anxiety disorder.   PAST SURGICAL HISTORY:  1.  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  2.  History of hysterectomy for bleeding complications back in 2001.  3.  Tubal ligation in 1986.   She has her gallbladder and her appendix.   SOCIAL HISTORY:  The patient lives in [place] with her husband.  She is  disabled because of her medical problems.  No history of smoking, alcohol or  illicit drug use.  She is independent with her activities of daily living.  She has never had a colonoscopy or an EGD.   FAMILY HISTORY:  Father died of heart  attack at age of 46.  His first heart  attack was at the age of 31.  He also had high blood pressure and local skin  cancer.   Mother had cirrhosis of the liver, diabetes, strokes and heart condition.  She died at the age of 49.  She also had colon polyps, unclear if they  cancerous.  She has brothers and sisters who also have diabetes, cirrhosis  of the liver and varicose veins.  One of her brothers had open-heart surgery  at the age of 39 for an MI.   REVIEW OF SYSTEMS:  A 10-point review of systems was unremarkable except as  mentioned in the HPI.   PHYSICAL EXAMINATION:  VITAL SIGNS:  Temperature 97.1, blood pressure  135/66, heart rate 77, respiratory rate 16.  Sats are 98% on room air.  GENERAL:  Exam showed a morbidly obese white female, very anxious in  appearance but in no distress.  LUNGS:  Clear to auscultation bilaterally.  CARDIOVASCULAR:  S1, S2 normal.  Regular.  No murmurs appreciated.  No S3,  S4.  No rubs.  ABDOMEN:  Reveals tenderness in the right upper quadrant.  Murphy sign might  be positive.  There is also tenderness in the right lower quadrant.  Actually, the whole entire right side of the abdomen has tenderness.  There  is no rebound, rigidity or guarding.  Bowel sounds are present and normal.  No mass or organomegaly is appreciated.  EXTREMITIES:  Reveal good peripheral pulses.  No edema.  NEUROLOGIC:  The patient is alert and oriented x3.  No focal neurological  deficits appreciated.   LABORATORY DATA:  Her CBC reveals a white count of 5.7, hemoglobin 11.3, MCV  85, platelet count 286.  Sodium 138, potassium 3.1, chloride 99, bicarb 33,  glucose 137, BUN 5, creatinine 0.9.  LFTs normal.  Albumin is 2.9, slightly  low.  Lipase 22.  UA showed trace ketones, otherwise unremarkable.   IMAGING STUDIES:  CT of the abdomen and pelvis was  done today as well as on the 8th, both with contrast, and showed mild fatty changes of the liver,  splenomegaly, some small  bowel mesenteric lymphadenopathy, which is mild.  CT of the pelvis did not show any acute problems.  There is no description  of a cyst in the left ovary.   The patient also had an ultrasound of the abdomen on February 8th which  showed no gallstones.  It showed fatty infiltration of the liver,  splenomegaly and slightly elongated kidneys without any lesions.   Her EKG shows sinus rhythm with a normal axis.  There is some T inversion in  lead III.  It is difficult to say if this is a Q wave in lead III or not.  Otherwise, I do not appreciate any other significant ST or T wave changes at  this time.   IMPRESSION:  This is a 50 year old white female with medical problems as  outlined before who presents with right upper quadrant and right lower  quadrant abdominal pain and also gives an incidental history of chest pain.  Differential diagnoses for abdominal pain include acalculus cholecystitis.  This could also be gastritis, although the pain is more in the right upper  quadrant than the epigastrium.  CT has failed to reveal any acute findings  otherwise.   Chest pain.  Again, the patient does have significant family history of  premature heart disease.  This pain could be angina.  It could be related to  her anxiety.  It could be related to gastric acid reflux disease.  This  could also be musculoskeletal pain.  Currently, she is chest-pain-free.   PLAN:  1.  Abdominal pain.  I am going to order an HIDA scan on this patient.  I      will have her evaluated by general surgery, Dr. Katrinka Blazing.  Control pain      initially with Toradol to avoid misinterpretation of the HIDA scan.   1.  Chest pain.  Again, atypical for coronary artery disease.  Actually,      review of her chart suggests she had a stress test back in August 2006      which was a very low-risk scan.  This included Myoview as well.  Her EF      was about 65% on this stress test.  Considering this recent stress test,      any  acute coronary syndrome is also less likely in this patient.      However, we will admit her to telemetry and will do serial cardiac      enzymes.  We will do an EKG.  We will also get an echocardiogram.  Once      again, considering a low-risk scan just about six months ago, it is less      likely that the patient has significant coronary artery disease, though      she is definitely at risk. If she rules out and if her ECHO is      unremarkable she may just require outpatient follow-up with Dr. Dorethea Clan.   1.  Hypertension.  I will continue her antihypertensives.  2.  Dyslipidemia.  I will continue her anticholesterol agents.  We will also      check a lipid profile in the morning.  3.  Bipolar disorder, with anxiety.  Continue all of her psychotropic      agents.  She appears to be stable behaviorally.  4.  DVT and GI prophylaxis  will be provided.  Further management decisions will be made based on the results of initial  testing and the patient's response to treatment.      Osvaldo Shipper, MD  Electronically Signed     GK/MEDQ  D:  10/26/2005  T:  10/26/2005  Job:  [id number]   cc:   Billee Cashing, MD  [phone] [address]  [place], [place] [address]   Dirk Dress. Katrinka Blazing, M.D.  Fax: [phone]   Vida Roller, M.D.  Fax: [phone]

## 2011-02-01 NOTE — H&P (Signed)
[name], [name]                   ACCOUNT NO.:  [id number]   MEDICAL RECORD NO.:  [id number]          PATIENT TYPE:  INP   LOCATION:  A217                          FACILITY:  [facility]   PHYSICIAN:  Dirk Dress. Katrinka Blazing, M.D.   DATE OF BIRTH:  1960/09/20   DATE OF ADMISSION:  DATE OF DISCHARGE:  LH                                HISTORY & PHYSICAL   HISTORY OF PRESENT ILLNESS:  This is a 50 year old female admitted for  evaluation of recurrent abdominal pain, nausea and vomiting. The patient  gives a history of acute onset of nausea with diarrhea on the evening of  October 19, 2005. The pain was in her right upper quadrant and right lateral  abdomen. It was followed by recurrent emesis. She states that the pain was  much more severe in the right subcostal region from initial onset. It has  always radiated through to her back. She became worse over the weekend and  was finally seen in the emergency room at [facility] on  Monday, October 21, 2005. She was treated with IV fluids and apparent  analgesics and antiemetics. She states that her pain never improved but she  was sent home. She was seen by her family physician at [facility] who treated her symptomatically. She finally had a CT and an  ultrasound at [facility] but the results of these tests were  negative. The patient continued to be symptomatic so her physician advised  her to come to the emergency room at [facility]. When seen in the emergency  room she noted that her pain was quite severe and was radiating through to  her back. She was again treated symptomatically and released. She continued  to have pain with nausea and vomiting and was seen again on the day of  admission where she had continued symptoms. Work up was still negative. CT  of the abdomen was negative. Repeat ultrasound was negative. She has been  admitted because she appears to be in severe distress and she continues to  have  nausea and vomiting. She states that the pain is always worse with  meals. She has not had fever, chills. She has not had dark urine and there  has not been any evidence of jaundice. She continues to have a very tender  abdomen though she does not have fever or leukocytosis.   PAST MEDICAL HISTORY:  1.  She has a history of hypertension.  2.  History of hyperlipidemia.  3.  Bipolar disorder.  4.  History of chest pain but had a negative stress-test in August of 2006.      There were no perfusion defects at that time. She has not had chest pain      recently. She states she was told that her chest pain was due to reflux      disease and she was placed on Nexium and her symptoms improved.   PAST SURGICAL HISTORY:  Hysterectomy, ovarian cystectomy, bilateral tubal  ligation.   MEDICATIONS:  1.  Lisinopril & hydrochlorothiazide 20/25  daily.  2.  Cymbalta 60 mg daily.  3.  Vytorin 10/40 daily.  4.  Depakote 500 mg t.i.d.  5.  Seroquel 100 mg at h.s.  6.  Nexium 40 mg daily.   ALLERGIES:  No known drug allergies.   SOCIAL HISTORY:  She is married. She is disabled. She has a history of heavy  alcohol and drug use in the past but not recently.   PHYSICAL EXAMINATION:  GENERAL:  She is an obese female in no acute  distress. She has a very flat affect.  VITAL SIGNS:  Blood pressure 122/66, pulse 75, respirations 20, temperature  97.7.  HEENT:  Unremarkable. There is no jaundice.  NECK:  Supple, no JVD, bruit, adenopathy or thyromegaly.  CHEST:  Clear to auscultation, no rales, rubs, rhonchi or wheezes.  HEART:  Regular rate and rhythm without murmur, gallop or rub.  ABDOMEN:  Obese, mildly distended. There is exquisite tenderness in the  right subcostal region laterally. There is mild tenderness in the right  lower quadrant at about the level of the iliac crest. She has good active  bowel sounds.  EXTREMITIES:  No clubbing, cyanosis or edema.  NEUROLOGIC EXAM:  Nonfocal, no motor,  sensory or cerebellar deficit.   IMPRESSION:  PROBLEM #1. Recurrent severe abdominal pain unresponsive to  symptomatic treatment, primarily in the right side with radiation into her  back with associated nausea, vomiting and diarrhea. In spite of two negative  CAT scans and two negative ultrasounds, history is strongly suggestive of  severe biliary colic and the patient probably has biliary dyskinesia or  acalculous cholecystitis. Amylase and lipase have been negative x2 at this  hospital.  PROBLEM #2. Hypertension.  PROBLEM #3. Bipolar disorder.  PROBLEM #4. Gastroesophageal reflux disease.  PROBLEM #5. Hyperlipidemia.   PLAN:  I agree with the need to proceed with HIDA scan. The Dilaudid and  Morphine should be withheld until after the HIDA scan so I have taken the  liberty of ordering her some Nubain which will be less likely to cause  papillary spasm or stenosis. If the patient has a positive HIDA scan we will  do laparoscopy and cholecystectomy. If the HIDA scan is negative I still  think that she has enough clinical findings that she would warrant having  Diagnostic laparoscopy. This has been discussed with the patient and she  agrees so we will go ahead and put her on the schedule for Tuesday and the  approach laparoscopically will be based on the findings of her HIDA scan.      Dirk Dress. Katrinka Blazing, M.D.  Electronically Signed     LCS/MEDQ  D:  10/27/2005  T:  10/27/2005  Job:  [id number]

## 2011-02-01 NOTE — H&P (Signed)
NAMEBRAIDEN, [name]                   ACCOUNT NO.:  [id number]   MEDICAL RECORD NO.:  [id number]          PATIENT TYPE:  OBV   LOCATION:  A226                          FACILITY:  [facility]   PHYSICIAN:  Osvaldo Shipper, MD     DATE OF BIRTH:  March 09, 1961   DATE OF ADMISSION:  01/05/2006  DATE OF DISCHARGE:  LH                                HISTORY & PHYSICAL   PRIMARY CARE PHYSICIAN:  Dr. Laural Benes at [facility].   ADMISSION DIAGNOSES:  1.  Syncope, unclear etiology.  2.  Chest pain, rule out pulmonary embolus.  3.  History of hypertension.  4.  History of dyslipidemia.  5.  Obesity.  6.  Bipolar disorder.  7.  Acid reflux disease.   CHIEF COMPLAINT:  Chest pain and syncopal episode since yesterday.   HISTORY OF PRESENT ILLNESS:  The patient is a 50 year old Caucasian female  who has medical problems as outlined above, who presented to our ED today  after experiencing chest pain, which started at about 12:00 this afternoon.  The pain is located in the retrosternal region, more so on the left side.  It is describe as a 7/10 in intensity.  Described as a tightness, which  seemed to radiate to the left shoulder.  The pain onset was mostly at rest.  Does not seem to be exacerbated by movement.  The patient does have  shortness of breath associated with the pain.  No history of any fevers or  chills.  No history of any cough at home.  No history of palpitations at  this time.  The patient has also continued on headache in the frontal area  as well as in the back area.   The patient was at [facility] yesterday after she had  two episodes of syncope.  The first one was at about 2:30 in the afternoon  when she was walking back from checking her mail, and she suddenly lost  consciousness.  No prodromal symptoms are present.  No lightheaded or  dizziness.  The patient actually does not quite remember what exactly  happened.  Apparently, she also did not  have any seizure activity.  No  urinary or bowel incontinence was noted.  Subsequently, later that night, at  about 9:00 p.m., the patient was again walking back into her home when she  again suddenly lost consciousness.  This episode lasted about 2-3 minutes.  Again, no seizure activity was noted.  The patient did not have any chest  pain afterwards.  She had some generalized weakness after she had a syncopal  episode with no focal weakness.   The second time she fell, there was blood noted from her forehead, and she  was taken to [facility], where she was sutured.  She  apparently had a CT of her head, but I do not see any report that is  available from records which have been sent over from there.   Review of records from [place] as well as from our own records suggest the  patient has been having chest pain for at least the past few months.  Actually, this could have even been longer because I see a stress test done  in August, 2006, which was not quite remarkable.  The patient was actually  recently admitted back on April 8th for left-sided chest pain.  At that  time, she underwent quite an extensive cardiac workup, which included  cardiology consultation and subsequently an echocardiogram was done, which  was not quite remarkable.  It showed normal left ventricular systolic  function, EF 60% with concentric left ventricular hypertrophy and mild  aortic incompetence was noted.  She also actually had a left heart  catheterization which essentially showed normal epicardial coronary arteries  with normal left ventricular systolic function.  No comment on the aorta was  noted.   HOME MEDICATIONS:  1.  Vytorin 10/40 1 tablet daily.  2.  Cymbalta 60 mg once daily.  3.  Depakote 500 mg in the a.m. and 1000 mg in the p.m.  4.  Seroquel 400 mg q.h.s.  5.  Klonopin 0.5 mg 4 times a day.  6.  Metoprolol 25 mg once daily.  7.  Nexium 40 mg once daily.  8.  Lortab  10/500 t.i.d., as needed.   ALLERGIES:  Include TYLENOL and CODEINE #3, which causes itching, and  IBUPROFEN causes GI intolerance.   PAST MEDICAL HISTORY:  1.  Bipolar disorder.  2.  Dyslipidemia.  3.  Hypertension.  4.  Acid reflux disease.  5.  Bulging disk.  6.  Chronic chest pain with cardiac evaluation, as mentioned above.   PAST SURGICAL HISTORY:  1.  Hysterectomy in the past with bleeding.  2.  Right oophorectomy for a tumor.  3.  Tubal ligation.  4.  Recent surgery back in February, 2007, which was done for abdominal      pain, for which she went exploratory laparotomy which showed chronic      appendicitis and chronic active cholecystitis.  This was done by Dr.      Katrinka Blazing here at [facility].   SOCIAL HISTORY:  Patient lives in [place] with her husband.  She is disabled.  No smoking, alcohol, or illicit drug use.   FAMILY HISTORY:  Significant for coronary artery disease, premature in her  father with the first heart event at the age of 44.  He died at age 5 of a  massive heart attack.  Her brother also has coronary artery disease and has  had CABG surgery.  There is also a history of CVA and diabetes in the  family.  Other family members have had coronary stents placed as well.   REVIEW OF SYSTEMS:  A 10-point review of systems was done, which was  unremarkable except as mentioned in the HPI.   PHYSICAL EXAMINATION:  VITAL SIGNS:  Temperature 97.7, blood pressure 99/47  on presentation, currently 113/59, heart rate 80s-90s, regular, respiratory  rate 18, saturation 98% on room air.  Currently, she is on 2 liters by nasal  cannula.  GENERAL:  An obese white female, anxious, but in no distress.  HEENT:  There is a sutured laceration seen on the left forehead.  No other  lesions are noted at this time.  No pallor or icterus appreciated.  NECK:  Soft and supple.  No thyromegaly is appreciated.  LUNGS:  Clear to auscultation bilaterally. CARDIOVASCULAR:  S1 and S2 is  normal.  Regular.  No murmurs appreciated.  No  S3 or S4.  No rubs.  No bruits are heard.  ABDOMEN:  Obese, nontender, nondistended.  Bowel sounds present.  No mass or  organomegaly present.  EXTREMITIES:  Pulses which are positive.  No edema is present.  There is  some weak calf tenderness in the right side.  NEUROLOGIC:  Patient's pupils are equal and reactive to light.  No other  focal deficits are appreciated.   LAB DATA:  CBC shows a white count of 4.9 with a normal differential.  Hemoglobin is 11.1.  MCV is 86, RDW 16.3.  The hemoglobin is close to her  baseline.  Platelet count is 267.  BNP shows a potassium of 3.1, glucose  158.  Renal functions are normal.  No LFTs are available.  Two sets of  cardiac markers are negative.  D-dimers are 0.22.   EKG shows sinus rhythm with a normal axis, Q waves appreciated which are  significant in lead III and significant in II and aVF.  There is some T wave  inversion noted in lead III as well.  Otherwise, no other Q waves are  appreciated.  There is no other ST or T wave changes of concern that are  noted.   Chest x-ray was unremarkable.   Elbow x-ray was also unremarkable on the left side.   IMPRESSION:  This is a 50 year old Caucasian female who has multiple medical  problems, as outlined earlier, who presents after having two episodes of  syncope at [facility] at home, for which she was evaluated at [facility], who now presents with left-sided chest pain.  Differential diagnosis for her chest pain at this time includes PE/aortic  dissection.  This could also be fibromyalgia secondary to acid reflux  disease, musculoskeletal in etiology.  Syncope is worrisome in this patient.  These attacks, from what they have mentioned, have been sudden in onset.  These could be secondary to cardiac arrhythmias.  Seizure problems also need  to be considered.  The patient continues to have a headache at this time.   PLAN:  1.   Chest pain:  Coronary artery disease is very unlikely in this patient,      who had a cardiac cath test two weeks ago.  Although her D-dimer is      within normal range, I am going to go ahead and get a CAT scan of the      chest done to rule out any problems with her major vessels in the chest.      If the CT is found to be negative other differentials, such as acid      reflux disease, fibromyalgia, may need to be considered.  She also has a      right calf tenderness, which will be evaluated with her lower extremity      Doppler to rule out DVT.  2.  Syncope:  Admit to telemetry.  Check carotid Dopplers.  We will get a      CAT scan of the head done, as I had not seen any report in the records      sent from [place].  She has continued with headache.  We want to rule      out any subdural hematoma, considering lucid intervals that are noted      with these.  As mentioned above, we will also get a CT of the chest to      rule out any vascular problems. 3.  We will continue other medications as  before.  4.  Her blood sugars were mildly elevated at this time.  We will check a      fasting level tomorrow, and if that is elevated, we will probably need      to check an HBA1C, as the patient may have diabetes.  Of note, her A1C      was 5.4 back in February, 2007.  5.  Appears to be at her baseline at this time.  She probably has evidence      for mild iron deficiency, based on her iron profile study done in      February, 2007.  She might benefit from iron sulfate on discharge.  6.  DVT and GI prophylaxis will be provided when CT head results are      available.      Osvaldo Shipper, MD  Electronically Signed     GK/MEDQ  D:  01/05/2006  T:  01/05/2006  Job:  [id number]

## 2011-02-01 NOTE — Consult Note (Signed)
NAME:  [name], [name]                   ACCOUNT NO.:  [id number]   MEDICAL RECORD NO.:  [id number]          PATIENT TYPE:  OBV   LOCATION:  A226                          FACILITY:  [facility]   PHYSICIAN:  Kofi A. Gerilyn Pilgrim, M.D. DATE OF BIRTH:  06/09/1961   DATE OF CONSULTATION:  01/07/2006  DATE OF DISCHARGE:                                   CONSULTATION   REFERRING PHYSICIAN:  Osvaldo Shipper, M.D.   REASON FOR CONSULTATION:  The patient is a 50 year old, white female who has  multiple medical problems at baseline.  She is seen by Dr. Laural Benes at  [facility] where she is being treated for chronic  problems including hypertension and dyslipidemia.  She reports that on  Thursday of last week, she was placed on a new type of antihypertensive,  although she could not state the name of the medication.  Two days later,  she had a passing out spell where she was walking around and simply blacked  out.  The blacking out spell was brief and really was not associated with  any other symptoms.  She denies any warning symptoms, chest pain, shortness  of breath, lightheadedness or dizziness.  She simply recalls being on the  floor with blood on her head after sustaining a laceration.  She was seen  and treated at the [facility] where she apparently had  CT scan and other tests which were unrevealing.  She was stapled.  The  patient was brought to the emergency room and later had another event when  again she was ambulating around and simply found herself on the ground  seconds later, again with no warning symptoms.  Both events are essentially  similar.  She does not report any urinary bladder incontinence, no tongue or  lip biting.   PAST MEDICAL HISTORY:  1.  Hypertension.  2.  Dyslipidemia.  3.  Obesity.  4.  Bipolar disorder.  5.  Gastroesophageal reflux disease.  6.  Bulging disc.  7.  Chronic chest pain treated in the past with cardiac evaluation  including      an echocardiogram which showed ejection fraction of 6% and concentric      left ventricular hypertrophy.   MEDICATIONS:  1.  Vytorin 10/40 daily.  2.  Cymbalta 6 mg daily.  3.  Depakote 500 mg in the morning and 1 g at nighttime.  4.  Seroquel 400 mg nightly.  5.  Klonopin 0.5 mg four times a day.  6.  Metoprolol 25 mg daily.  7.  Nexium 40 mg daily.  8.  Lortab 10/500 t.i.d. p.r.n.   ALLERGIES:  TYLENOL NO. 3 WITH CODEINE, IBUPROFEN causes GI intolerance.   SOCIAL HISTORY:  She lives with her husband.  She is disabled.  No alcohol,  tobacco or illicit drug use.   REVIEW OF SYSTEMS:  Negative, as stated in history of present illness.   FAMILY HISTORY:  Half sister with seizures.  Significant history of coronary  artery disease and stroke.   PHYSICAL EXAMINATION:  GENERAL:  Morbidly obese  lady in no acute distress.  VITAL SIGNS:  Temperature 97.8, pulse 75, blood pressure 103/73,  respirations 18.  HEENT:  Neck is supple.  Head is normocephalic.  There is a 2-inch  laceration involving the forehead on the left side that has been stapled.  NEUROLOGIC:  The patient is awake and alert.  She converses well.  Speech,  language and cognition are intact.  Pupils are 4 mm and reactive to light.  Extraocular movements intact.  Visual fields are full.  Facial muscle  strength is symmetric.  Tongue is midline.  Uvula is midline.  Motor  examination shows normal tone, bulk and strength.  There is no pronator  drift.  Does not show any significant dysmetria.  There are no tremors  noted.  Reflexes are symmetric and normal.  Plantar reflexes are both  downgoing.   IMPRESSION:  Syncope, unclear etiology.  Given the chronology with the  initiation of new blood pressure medication, I suspect this is most likely  etiology.  The associated amnesia is somewhat concerning and probably she  should have an electroencephalogram for the issue of possible seizures.   RECOMMENDATIONS:   EEG.      Kofi A. Gerilyn Pilgrim, M.D.  Electronically Signed     KAD/MEDQ  D:  01/07/2006  T:  01/07/2006  Job:  [id number]

## 2011-02-01 NOTE — Op Note (Signed)
[name], [name]                   ACCOUNT NO.:  [id number]   MEDICAL RECORD NO.:  [id number]          PATIENT TYPE:  OBV   LOCATION:  A214                          FACILITY:  [facility]   PHYSICIAN:  Lionel December, M.D.    DATE OF BIRTH:  1961/06/27   DATE OF PROCEDURE:  03/02/2006  DATE OF DISCHARGE:  03/01/2006                                 OPERATIVE REPORT   PROCEDURE:  Esophageal pH monitoring with Bravo device.   INDICATIONS:  [name] is a 50 year old Caucasian female with recurrent chest  pain felt to be noncardiac who has chronic GERD and is on PPI with control  of her heartburn but not chest pain.  She had a Bravo device placed on February 28, 2006.  This study is done on single dose PPI.   FINDINGS:  DAY ONE ANALYSIS:  1.  Duration 23 hours and 36 minutes.  2.  Number of reflux episodes 29 all of which occurred in upright position.  3.  Number of reflux episodes greater than 5 minutes was one.  4.  Duration of longest reflux episode 7 minutes.  5.  Time pH below four was 23 minute, fraction time pH below four is 1.7%.   DAY TWO ANALYSIS:  1.  Duration of study 23 hours and 18 minutes.  2.  Number of reflux episodes 37.  3.  Number of reflux episodes longer than 5 minutes is two.  4.  Duration of longest reflux episode 5 minutes.  5.  Time pH below 4 is 21 minutes, fraction time pH below 4 is 1.5%.   COMBINED 2-DAY ANALYSIS:  1.  Duration of study is 46 hours and 54 minutes.  2.  Number of reflux episodes 66, all of which occurred in upright position.  3.  Number of reflux episodes greater than 5 minutes is 3.  4.  Duration of longest reflux episode is 7 minutes.  5.  Time PH below for his 44 minutes, fraction time pH below 4 is 1.6%.   SYMPTOM DIARY:  The patient reported 9 episodes of chest pain and only 3 of  those were associated with pH of below 4.   The patient reported 43 episodes of regurgitation during the study and acid  was documented in the esophagus with 10 of  these.   IMPRESSION:  This is a normal study on single dose proton pump inhibitor.  The patient is having acid reflux within a physiologic range.  All of her  episodes occur in upright position or when she is awake.   3/10 episodes of chest pain were associated with acid exposure to esophagus  indicating that some of her chest pain may be related to gastroesophageal  reflux disease.   RECOMMENDATIONS:  The patient advised to increase her Nexium to 40 mg p.o.  b.i.d. She will pick up 1 month supply of samples to supplement the  prescription.  She will return in a month to evaluate response to therapy.      Lionel December, M.D.  Electronically Signed     NR/MEDQ  D:  03/06/2006  T:  03/06/2006  Job:  [id number]   cc:   Osvaldo Shipper, MD

## 2011-02-04 ENCOUNTER — Emergency Department (HOSPITAL_COMMUNITY)
Admission: EM | Admit: 2011-02-04 | Discharge: 2011-02-04 | Disposition: A | Discharge status: Home / Self Care | Payer: Medicaid - Out of State | Attending: Emergency Medicine | Admitting: Emergency Medicine

## 2011-02-04 ENCOUNTER — Emergency Department (HOSPITAL_COMMUNITY): Payer: Medicaid - Out of State

## 2011-02-04 DIAGNOSIS — R51 Headache: Secondary | ICD-10-CM | POA: Insufficient documentation

## 2011-02-04 DIAGNOSIS — Z79899 Other long term (current) drug therapy: Secondary | ICD-10-CM | POA: Insufficient documentation

## 2011-02-04 DIAGNOSIS — E7800 Pure hypercholesterolemia, unspecified: Secondary | ICD-10-CM | POA: Insufficient documentation

## 2011-02-04 DIAGNOSIS — R079 Chest pain, unspecified: Secondary | ICD-10-CM | POA: Insufficient documentation

## 2011-02-04 DIAGNOSIS — R110 Nausea: Secondary | ICD-10-CM | POA: Insufficient documentation

## 2011-02-04 DIAGNOSIS — F319 Bipolar disorder, unspecified: Secondary | ICD-10-CM | POA: Insufficient documentation

## 2011-02-04 DIAGNOSIS — F411 Generalized anxiety disorder: Secondary | ICD-10-CM | POA: Insufficient documentation

## 2011-02-04 DIAGNOSIS — I10 Essential (primary) hypertension: Secondary | ICD-10-CM | POA: Insufficient documentation

## 2011-02-04 LAB — DIFFERENTIAL
Basophils Absolute: 0 10*3/uL (ref 0.0–0.1)
Basophils Relative: 0 % (ref 0–1)
Neutro Abs: 3.5 10*3/uL (ref 1.7–7.7)
Neutrophils Relative %: 59 % (ref 43–77)

## 2011-02-04 LAB — CBC
Hemoglobin: 10.3 g/dL — ABNORMAL LOW (ref 12.0–15.0)
RBC: 3.82 MIL/uL — ABNORMAL LOW (ref 3.87–5.11)

## 2011-02-04 LAB — BASIC METABOLIC PANEL
CO2: 29 mEq/L (ref 19–32)
Calcium: 9.1 mg/dL (ref 8.4–10.5)
Chloride: 98 mEq/L (ref 96–112)
GFR calc Af Amer: >60 mL/min (ref >60)
Sodium: 136 mEq/L (ref 135–145)

## 2011-02-04 LAB — POCT CARDIAC MARKERS
CKMB, poc: <1 ng/mL — ABNORMAL LOW (ref 1.0–8.0)
Myoglobin, poc: 41.1 ng/mL (ref 12–200)

## 2011-02-17 ENCOUNTER — Emergency Department (HOSPITAL_COMMUNITY): Payer: Medicaid - Out of State

## 2011-02-17 ENCOUNTER — Emergency Department (HOSPITAL_COMMUNITY)
Admission: EM | Admit: 2011-02-17 | Discharge: 2011-02-17 | Disposition: A | Discharge status: Home / Self Care | Payer: Medicaid - Out of State | Attending: Emergency Medicine | Admitting: Emergency Medicine

## 2011-02-17 DIAGNOSIS — R079 Chest pain, unspecified: Secondary | ICD-10-CM | POA: Insufficient documentation

## 2011-02-17 DIAGNOSIS — R0602 Shortness of breath: Secondary | ICD-10-CM | POA: Insufficient documentation

## 2011-02-17 LAB — DIFFERENTIAL
Basophils Absolute: 0 10*3/uL (ref 0.0–0.1)
Lymphocytes Relative: 36 % (ref 12–46)
Monocytes Absolute: 0.3 10*3/uL (ref 0.1–1.0)
Neutro Abs: 3.1 10*3/uL (ref 1.7–7.7)

## 2011-02-17 LAB — CK TOTAL AND CKMB (NOT AT ARMC): Relative Index: INVALID (ref 0.0–2.5)

## 2011-02-17 LAB — COMPREHENSIVE METABOLIC PANEL
ALT: 6 U/L (ref 0–35)
Alkaline Phosphatase: 63 U/L (ref 39–117)
CO2: 31 mEq/L (ref 19–32)
Calcium: 9.5 mg/dL (ref 8.4–10.5)
Chloride: 98 mEq/L (ref 96–112)
GFR calc non Af Amer: >60 mL/min (ref >60)
Glucose, Bld: 106 mg/dL — ABNORMAL HIGH (ref 70–99)
Potassium: 3.6 mEq/L (ref 3.5–5.1)
Sodium: 137 mEq/L (ref 135–145)
Total Bilirubin: 0.1 mg/dL — ABNORMAL LOW (ref 0.3–1.2)

## 2011-02-17 LAB — CBC
HCT: 34.8 % — ABNORMAL LOW (ref 36.0–46.0)
Hemoglobin: 11 g/dL — ABNORMAL LOW (ref 12.0–15.0)
MCHC: 31.6 g/dL (ref 30.0–36.0)
RBC: 4.12 MIL/uL (ref 3.87–5.11)

## 2011-06-10 LAB — BASIC METABOLIC PANEL
Calcium: 8.9
Creatinine, Ser: 0.84
GFR calc Af Amer: >60
GFR calc non Af Amer: >60

## 2011-06-10 LAB — POCT I-STAT, CHEM 8
Chloride: 101
Creatinine, Ser: 0.8
Glucose, Bld: 138 — ABNORMAL HIGH
HCT: 39
Potassium: 3.2 — ABNORMAL LOW

## 2011-06-10 LAB — CBC
RBC: 4.59
WBC: 8.1

## 2011-06-10 LAB — DIFFERENTIAL
Lymphs Abs: 2.9
Monocytes Relative: 5
Neutro Abs: 4.7
Neutrophils Relative %: 58

## 2011-06-10 LAB — POCT CARDIAC MARKERS
Myoglobin, poc: 36.6
Operator id: 237661
Troponin i, poc: <0.05

## 2011-06-13 LAB — CBC
HCT: 35.5 — ABNORMAL LOW
Hemoglobin: 11.9 — ABNORMAL LOW
MCV: 76.5 — ABNORMAL LOW
Platelets: 234
WBC: 5.4

## 2011-06-13 LAB — BASIC METABOLIC PANEL
BUN: 5 — ABNORMAL LOW
Chloride: 104
Glucose, Bld: 94
Potassium: 4
Sodium: 137

## 2011-06-13 LAB — POCT CARDIAC MARKERS: Troponin i, poc: <0.05

## 2011-07-02 LAB — BASIC METABOLIC PANEL
BUN: 3 — ABNORMAL LOW
Chloride: 103
GFR calc Af Amer: >60
GFR calc non Af Amer: >60
Potassium: 3.7
Sodium: 140

## 2011-07-02 LAB — DIFFERENTIAL
Eosinophils Absolute: 0
Eosinophils Relative: 1
Lymphocytes Relative: 31
Lymphs Abs: 1.6
Monocytes Absolute: 0.4
Monocytes Relative: 7

## 2011-07-02 LAB — CBC
HCT: 34 — ABNORMAL LOW
Hemoglobin: 11.3 — ABNORMAL LOW
MCV: 79.9
RBC: 4.25
WBC: 5.2

## 2011-07-02 LAB — POCT CARDIAC MARKERS
CKMB, poc: <1 — ABNORMAL LOW
Troponin i, poc: <0.05

## 2011-11-26 ENCOUNTER — Other Ambulatory Visit: Payer: Self-pay

## 2011-11-26 ENCOUNTER — Encounter (HOSPITAL_COMMUNITY): Payer: Self-pay | Admitting: Emergency Medicine

## 2011-11-26 ENCOUNTER — Emergency Department (HOSPITAL_COMMUNITY)
Admission: EM | Admit: 2011-11-26 | Discharge: 2011-11-27 | Disposition: A | Discharge status: Home / Self Care | Payer: Medicaid - Out of State | Attending: Emergency Medicine | Admitting: Emergency Medicine

## 2011-11-26 ENCOUNTER — Emergency Department (HOSPITAL_COMMUNITY): Payer: Medicaid - Out of State

## 2011-11-26 DIAGNOSIS — R5381 Other malaise: Secondary | ICD-10-CM | POA: Insufficient documentation

## 2011-11-26 DIAGNOSIS — I10 Essential (primary) hypertension: Secondary | ICD-10-CM | POA: Insufficient documentation

## 2011-11-26 DIAGNOSIS — R3589 Other polyuria: Secondary | ICD-10-CM | POA: Insufficient documentation

## 2011-11-26 DIAGNOSIS — R531 Weakness: Secondary | ICD-10-CM

## 2011-11-26 DIAGNOSIS — K219 Gastro-esophageal reflux disease without esophagitis: Secondary | ICD-10-CM | POA: Insufficient documentation

## 2011-11-26 DIAGNOSIS — R0602 Shortness of breath: Secondary | ICD-10-CM | POA: Insufficient documentation

## 2011-11-26 DIAGNOSIS — R51 Headache: Secondary | ICD-10-CM | POA: Insufficient documentation

## 2011-11-26 DIAGNOSIS — E785 Hyperlipidemia, unspecified: Secondary | ICD-10-CM | POA: Insufficient documentation

## 2011-11-26 DIAGNOSIS — R630 Anorexia: Secondary | ICD-10-CM | POA: Insufficient documentation

## 2011-11-26 DIAGNOSIS — R631 Polydipsia: Secondary | ICD-10-CM | POA: Insufficient documentation

## 2011-11-26 DIAGNOSIS — R109 Unspecified abdominal pain: Secondary | ICD-10-CM | POA: Insufficient documentation

## 2011-11-26 DIAGNOSIS — F319 Bipolar disorder, unspecified: Secondary | ICD-10-CM | POA: Insufficient documentation

## 2011-11-26 DIAGNOSIS — R110 Nausea: Secondary | ICD-10-CM | POA: Insufficient documentation

## 2011-11-26 DIAGNOSIS — R358 Other polyuria: Secondary | ICD-10-CM | POA: Insufficient documentation

## 2011-11-26 HISTORY — DX: Essential (primary) hypertension: I10

## 2011-11-26 HISTORY — DX: Bipolar disorder, unspecified: F31.9

## 2011-11-26 HISTORY — DX: Hyperlipidemia, unspecified: E78.5

## 2011-11-26 HISTORY — DX: Gastro-esophageal reflux disease without esophagitis: K21.9

## 2011-11-26 LAB — GLUCOSE, CAPILLARY: Glucose-Capillary: 93 mg/dL (ref 70–99)

## 2011-11-26 MED ORDER — SODIUM CHLORIDE 0.9 % IV SOLN: Freq: Once | INTRAVENOUS | Status: DC

## 2011-11-26 MED ORDER — SODIUM CHLORIDE 0.9 % IV BOLUS (SEPSIS)
1000.0000 mL | Freq: Once | INTRAVENOUS | Status: AC
  Administered 2011-11-26: 1000 mL via INTRAVENOUS

## 2011-11-26 NOTE — ED Provider Notes (Signed)
History  Scribed for Holly Baker, MD, the patient was seen in room APA04/APA04. This chart was scribed by [name]. The patient's care started at 11:20 PM    CSN: [id number]  Arrival date & time 11/26/11  2150   First MD Initiated Contact with Patient 11/26/11 2317      Chief Complaint  Patient presents with  . Headache  . Weakness  . Nausea     The history is provided by the patient.   [name] is a 51 y.o. female who presents to the Emergency Department complaining of constant nausea for the last two days.  She is also experiencing decreased appetite, increased urination, increased thirst, SOB, and abdominal pain.  She denies diarrhea, weight changes, or cough.  Nothing seems to make the sx better or worse.  She reports that she has experienced similar sx in the past and had a heart catheterization with no blockages found.  There have been no changes to her medications recently.  She denies chest pain or exertional angina   Past Medical History  Diagnosis Date  . Hypertension   . Hyperlipemia   . GERD (gastroesophageal reflux disease)   . Bipolar 1 disorder     Past Surgical History  Procedure Date  . Cholecystectomy   . Appendectomy   . Abdominal hysterectomy     No family history on file.  History  Substance Use Topics  . Smoking status: Never Smoker   . Smokeless tobacco: Not on file  . Alcohol Use: No    OB History    Grav Para Term Preterm Abortions TAB SAB Ect Mult Living                  Review of Systems  Constitutional: Positive for appetite change. Negative for unexpected weight change.  Respiratory: Positive for shortness of breath. Negative for cough.   Gastrointestinal: Positive for nausea and abdominal pain. Negative for diarrhea.  Genitourinary: Positive for frequency.  All other systems reviewed and are negative.    Allergies  Ibuprofen and Macrobid  Home Medications  No current outpatient prescriptions on file.  BP  182/101  Pulse 90  Temp(Src) 97.8 F (36.6 C) (Oral)  Resp 18  Ht 5\' 3"  (1.6 m)  Wt 234 lb (106.142 kg)  BMI 41.45 kg/m2  SpO2 99%  Physical Exam  Nursing note and vitals reviewed. Constitutional: She is oriented to person, place, and time. She appears well-developed and well-nourished. No distress.  HENT:  Head: Normocephalic and atraumatic.  Eyes: EOM are normal. Right eye exhibits no discharge. Left eye exhibits no discharge.  Neck: Normal range of motion. Neck supple.  Cardiovascular: Normal rate and regular rhythm.   Pulmonary/Chest: Effort normal. She has no wheezes. She has no rales.  Abdominal: Soft.  Musculoskeletal: Normal range of motion. She exhibits no tenderness.  Neurological: She is alert and oriented to person, place, and time.  Skin: Skin is warm and dry. She is not diaphoretic.  Psychiatric: She has a normal mood and affect. Her behavior is normal.    ED Course  Procedures   DIAGNOSTIC STUDIES: Oxygen Saturation is 99% on room air, normal by my interpretation.    COORDINATION OF CARE: 11:24PM Ordered: CBC ; Differential ; Comprehensive metabolic panel ; i-Stat troponin I ; Urinalysis, Routine w reflex microscopic ; Urine culture ; DG Chest 2 View ; sodium chloride 0.9 % bolus 1,000 mL ; 0.9 % sodium chloride infusion ; EKG 12-Lead ; Orthostatic  vital signs      Labs Reviewed  GLUCOSE, CAPILLARY  CBC  DIFFERENTIAL  COMPREHENSIVE METABOLIC PANEL  URINALYSIS, ROUTINE W REFLEX MICROSCOPIC  URINE CULTURE   No results found.   No diagnosis found.    MDM   Date: 11/26/2011  Rate: 71  Rhythm: normal sinus rhythm  QRS Axis: normal  Intervals: normal  ST/T Wave abnormalities: normal  Conduction Disutrbances:none  Narrative Interpretation:   Old EKG Reviewed: unchanged   I personally performed the services described in this documentation, which was scribed in my presence. The recorded information has been reviewed and considered.  Labs and  xrays reviewed, pt given meds and feels better        Holly Baker, MD 11/29/11 [phone]

## 2011-11-26 NOTE — ED Notes (Signed)
Pt with HA nausea and weakness x 2 days

## 2011-11-27 LAB — COMPREHENSIVE METABOLIC PANEL
AST: 7 U/L (ref 0–37)
Albumin: 3.5 g/dL (ref 3.5–5.2)
Alkaline Phosphatase: 56 U/L (ref 39–117)
BUN: 10 mg/dL (ref 6–23)
CO2: 30 mEq/L (ref 19–32)
Chloride: 98 mEq/L (ref 96–112)
Creatinine, Ser: 0.81 mg/dL (ref 0.50–1.10)
GFR calc non Af Amer: 84 mL/min — ABNORMAL LOW (ref >90)
Potassium: 3.5 mEq/L (ref 3.5–5.1)
Total Bilirubin: 0.2 mg/dL — ABNORMAL LOW (ref 0.3–1.2)

## 2011-11-27 LAB — DIFFERENTIAL
Basophils Absolute: 0 10*3/uL (ref 0.0–0.1)
Basophils Relative: 0 % (ref 0–1)
Lymphocytes Relative: 31 % (ref 12–46)
Monocytes Absolute: 0.4 10*3/uL (ref 0.1–1.0)
Monocytes Relative: 6 % (ref 3–12)
Neutro Abs: 4.3 10*3/uL (ref 1.7–7.7)
Neutrophils Relative %: 61 % (ref 43–77)

## 2011-11-27 LAB — URINALYSIS, ROUTINE W REFLEX MICROSCOPIC
Glucose, UA: NEGATIVE mg/dL
Hgb urine dipstick: NEGATIVE
Ketones, ur: NEGATIVE mg/dL
Protein, ur: NEGATIVE mg/dL
Urobilinogen, UA: 0.2 mg/dL (ref 0.0–1.0)

## 2011-11-27 LAB — CBC
HCT: 34.9 % — ABNORMAL LOW (ref 36.0–46.0)
Hemoglobin: 11.2 g/dL — ABNORMAL LOW (ref 12.0–15.0)
MCHC: 32.1 g/dL (ref 30.0–36.0)
WBC: 7 10*3/uL (ref 4.0–10.5)

## 2011-11-27 LAB — URINE CULTURE: Colony Count: 60000

## 2011-11-27 LAB — POCT I-STAT TROPONIN I: Troponin i, poc: 0 ng/mL (ref 0.00–0.08)

## 2011-11-27 LAB — URINE MICROSCOPIC-ADD ON

## 2011-12-17 ENCOUNTER — Encounter (HOSPITAL_COMMUNITY): Payer: Self-pay | Admitting: Emergency Medicine

## 2011-12-17 ENCOUNTER — Emergency Department (HOSPITAL_COMMUNITY)
Admission: EM | Admit: 2011-12-17 | Discharge: 2011-12-18 | Disposition: A | Discharge status: Home / Self Care | Payer: Medicaid - Out of State | Attending: Emergency Medicine | Admitting: Emergency Medicine

## 2011-12-17 ENCOUNTER — Emergency Department (HOSPITAL_COMMUNITY): Payer: Medicaid - Out of State

## 2011-12-17 DIAGNOSIS — F319 Bipolar disorder, unspecified: Secondary | ICD-10-CM | POA: Insufficient documentation

## 2011-12-17 DIAGNOSIS — E785 Hyperlipidemia, unspecified: Secondary | ICD-10-CM | POA: Insufficient documentation

## 2011-12-17 DIAGNOSIS — R1032 Left lower quadrant pain: Secondary | ICD-10-CM | POA: Insufficient documentation

## 2011-12-17 DIAGNOSIS — K219 Gastro-esophageal reflux disease without esophagitis: Secondary | ICD-10-CM | POA: Insufficient documentation

## 2011-12-17 DIAGNOSIS — K5900 Constipation, unspecified: Secondary | ICD-10-CM | POA: Insufficient documentation

## 2011-12-17 DIAGNOSIS — K921 Melena: Secondary | ICD-10-CM | POA: Insufficient documentation

## 2011-12-17 DIAGNOSIS — R5381 Other malaise: Secondary | ICD-10-CM | POA: Insufficient documentation

## 2011-12-17 DIAGNOSIS — R110 Nausea: Secondary | ICD-10-CM | POA: Insufficient documentation

## 2011-12-17 DIAGNOSIS — I10 Essential (primary) hypertension: Secondary | ICD-10-CM | POA: Insufficient documentation

## 2011-12-17 LAB — URINALYSIS, ROUTINE W REFLEX MICROSCOPIC
Bilirubin Urine: NEGATIVE
Ketones, ur: NEGATIVE mg/dL
Leukocytes, UA: NEGATIVE
Nitrite: NEGATIVE
Protein, ur: NEGATIVE mg/dL
Urobilinogen, UA: 0.2 mg/dL (ref 0.0–1.0)

## 2011-12-17 LAB — DIFFERENTIAL
Basophils Relative: 0 % (ref 0–1)
Eosinophils Absolute: 0.1 10*3/uL (ref 0.0–0.7)
Lymphs Abs: 1.9 10*3/uL (ref 0.7–4.0)
Monocytes Absolute: 0.4 10*3/uL (ref 0.1–1.0)
Monocytes Relative: 8 % (ref 3–12)
Neutro Abs: 3.3 10*3/uL (ref 1.7–7.7)
Neutrophils Relative %: 57 % (ref 43–77)

## 2011-12-17 LAB — COMPREHENSIVE METABOLIC PANEL
Alkaline Phosphatase: 54 U/L (ref 39–117)
BUN: 9 mg/dL (ref 6–23)
Chloride: 101 mEq/L (ref 96–112)
Creatinine, Ser: 0.75 mg/dL (ref 0.50–1.10)
GFR calc Af Amer: >90 mL/min (ref >90)
GFR calc non Af Amer: >90 mL/min (ref >90)
Glucose, Bld: 92 mg/dL (ref 70–99)
Potassium: 4 mEq/L (ref 3.5–5.1)
Total Bilirubin: 0.1 mg/dL — ABNORMAL LOW (ref 0.3–1.2)

## 2011-12-17 LAB — URINE MICROSCOPIC-ADD ON

## 2011-12-17 LAB — CBC
HCT: 32.5 % — ABNORMAL LOW (ref 36.0–46.0)
Hemoglobin: 10.2 g/dL — ABNORMAL LOW (ref 12.0–15.0)
MCH: 26.4 pg (ref 26.0–34.0)
MCHC: 31.4 g/dL (ref 30.0–36.0)
RBC: 3.87 MIL/uL (ref 3.87–5.11)

## 2011-12-17 LAB — LIPASE, BLOOD: Lipase: 21 U/L (ref 11–59)

## 2011-12-17 MED ORDER — ONDANSETRON HCL 4 MG/2ML IJ SOLN
4.0000 mg | Freq: Once | INTRAMUSCULAR | Status: AC
  Administered 2011-12-17: 4 mg via INTRAVENOUS
  Filled 2011-12-17: qty 2

## 2011-12-17 MED ORDER — SODIUM CHLORIDE 0.9 % IV SOLN: INTRAVENOUS | Status: DC

## 2011-12-17 MED ORDER — HYDROMORPHONE HCL PF 1 MG/ML IJ SOLN
1.0000 mg | Freq: Once | INTRAMUSCULAR | Status: AC
  Administered 2011-12-17: 1 mg via INTRAVENOUS
  Filled 2011-12-17: qty 1

## 2011-12-17 MED ORDER — FAMOTIDINE IN NACL 20-0.9 MG/50ML-% IV SOLN
20.0000 mg | Freq: Once | INTRAVENOUS | Status: AC
  Administered 2011-12-17: 20 mg via INTRAVENOUS
  Filled 2011-12-17: qty 50

## 2011-12-17 MED ORDER — SODIUM CHLORIDE 0.9 % IV BOLUS (SEPSIS)
1000.0000 mL | Freq: Once | INTRAVENOUS | Status: AC
  Administered 2011-12-17: 1000 mL via INTRAVENOUS

## 2011-12-17 NOTE — ED Notes (Signed)
Resting in bed sitting up. Pain 6\10 at this time. Denies nausea. In no distress. Watching tv. Call bell within reach. No episodes of vomiting. Family with patient.

## 2011-12-17 NOTE — ED Notes (Signed)
Medicated as ordered for 7\10 abdominal pain. Tolerated well. Denies any needs. No distress. Given warm blanket. Family at bedside. Call bell within reach. Bed in low position and locked with side rails up.

## 2011-12-17 NOTE — ED Provider Notes (Signed)
History  This chart was scribed for Felisa Bonier, MD by Bennett Scrape. This patient was seen in room APA03/APA03 and the patient's care was started at 10:10PM.  CSN: [id number]  Arrival date & time 12/17/11  2105   First MD Initiated Contact with Patient 12/17/11 2151      Chief Complaint  Patient presents with  . Abdominal Pain  . Weakness    The history is provided by the patient. No language interpreter was used.    [name] is a 51 y.o. female who presents to the Emergency Department complaining of less than 24 hours of gradual onset, gradually worsening, constant LLQ abdominal pain. The pain is aching and sharp in quality and non-radiating. Pt lists constipation, passing a hard stool this afternoon with blood mixed in with stool, generalized weakness and nausea as associated symptoms. She denies any modifying factors and has not taken any medication at home to improve symptoms. She reports that her last BM was today and she did not get relief from the symptoms with it. She states that it was harder than normal and that there was some blood present in the toilet. Pt reports that it is normal for her to go 2 weeks without a BM but she has never experienced abdominal pain with it before. She denies fevers, diarrhea, dysuria, chest pain, sore throat, congestion and HA as associated symptoms. She denies having a h/o diverticulitis. She does have a h/o HTN and bipolar disorder. She denies smoking and alcohol use.   Past Medical History  Diagnosis Date  . Hypertension   . Hyperlipemia   . GERD (gastroesophageal reflux disease)   . Bipolar 1 disorder     Past Surgical History  Procedure Date  . Cholecystectomy   . Appendectomy   . Abdominal hysterectomy     No family history on file.  History  Substance Use Topics  . Smoking status: Never Smoker   . Smokeless tobacco: Not on file  . Alcohol Use: No     Review of Systems  Constitutional: Negative for fever and  chills.  HENT: Negative for congestion, sore throat and neck pain.   Eyes: Negative for pain.  Respiratory: Negative for cough and shortness of breath.   Cardiovascular: Negative for chest pain.  Gastrointestinal: Positive for nausea, abdominal pain, constipation and blood in stool. Negative for vomiting and diarrhea.  Genitourinary: Negative for dysuria, urgency and hematuria.  Musculoskeletal: Negative for back pain.  Skin: Negative for rash.  Neurological: Positive for weakness. Negative for seizures and headaches.  Psychiatric/Behavioral: Negative for confusion.    Allergies  Ibuprofen and Macrobid  Home Medications  No current outpatient prescriptions on file.  Triage Vitals: BP 150/101  Pulse 93  Temp(Src) 97.5 F (36.4 C) (Oral)  Resp 20  Ht 5\' 3"  (1.6 m)  Wt 235 lb (106.595 kg)  BMI 41.63 kg/m2  SpO2 100%  Physical Exam  Nursing note and vitals reviewed. Constitutional: She is oriented to person, place, and time. She appears well-developed and well-nourished.  HENT:  Head: Normocephalic and atraumatic.  Mouth/Throat: Oropharynx is clear and moist.       Slightly dry mucous membranes  Eyes: Conjunctivae and EOM are normal. Pupils are equal, round, and reactive to light. No scleral icterus.  Neck: Normal range of motion. Neck supple.  Cardiovascular: Normal rate, regular rhythm and normal heart sounds.  Exam reveals no gallop and no friction rub.   No murmur heard. Pulmonary/Chest: Effort normal and breath  sounds normal. No respiratory distress. She has no wheezes. She has no rales.       No rhonchi noted  Abdominal: Soft. Bowel sounds are normal. There is tenderness (tenderness to palpation in LLQ). There is no rebound and no guarding.  Musculoskeletal: Normal range of motion. She exhibits no edema.  Neurological: She is alert and oriented to person, place, and time.  Skin: Skin is warm and dry.  Psychiatric: She has a normal mood and affect. Her behavior is  normal.    ED Course  Procedures (including critical care time)  DIAGNOSTIC STUDIES: Oxygen Saturation is 100% on room air, normal by my interpretation.    COORDINATION OF CARE: 10:13PM-Discussed pain medications, CT scan of abdomen and blood work with pt and pt agreed to plan.   Labs Reviewed - No data to display No results found.   No diagnosis found.    MDM  Possible diverticulitis vs. Constipation vs. Urinary tract infection and renal colic.  I will obtain CT abdomen/pelvis to evaluate abd. Pain/blood in stool further.      I personally performed the services described in this documentation, which was scribed in my presence. The recorded information has been reviewed and considered.   Felisa Bonier, MD 12/17/11 2325

## 2011-12-17 NOTE — ED Notes (Signed)
Patient ambulatory with steady gait to bathroom to obtain urine specimen via clean catch.

## 2011-12-17 NOTE — ED Notes (Signed)
Pt states she feels weak and has Abd pain

## 2011-12-17 NOTE — ED Notes (Signed)
Into room to attempt iv access and draw blood. Resting comfortably in bed on back. Husband at bedside. Call bell within reach. Denies any needs. Awaiting MD eval.

## 2011-12-17 NOTE — ED Notes (Signed)
Resting sitting up in bed. No distress. Equal chest rise and fall. Pain 8\10. Call bell within reach. Will continue to monitor.

## 2011-12-17 NOTE — ED Notes (Signed)
Patient transported to CT via stretcher. IV saline locked for CT.

## 2011-12-17 NOTE — ED Notes (Signed)
Patient states she has been feeling weak since this morning. States she has had lower middle and lower left abdominal pain. Described as a constant cramp. Nothing makes it better or worse. Last food\ fluid intake was 1930 this evening and tolerated well. Last BM was today and hard. States she has trouble having BMs anyway. Denies vomiting or diarrhea but is nauseated. Active bowel sounds in all fields. Tenderness in lower middle and lower left quadrant.

## 2011-12-18 MED ORDER — HYDROMORPHONE HCL PF 1 MG/ML IJ SOLN
1.0000 mg | Freq: Once | INTRAMUSCULAR | Status: AC
Start: 2011-12-18 — End: 2011-12-18
  Administered 2011-12-18: 1 mg via INTRAVENOUS
  Filled 2011-12-18: qty 1

## 2011-12-18 MED ORDER — IOHEXOL 300 MG/ML  SOLN
100.0000 mL | Freq: Once | INTRAMUSCULAR | Status: AC | PRN
  Administered 2011-12-18: 100 mL via INTRAVENOUS

## 2011-12-18 MED ORDER — HYDROCODONE-ACETAMINOPHEN 5-325 MG PO TABS: 1.0000 | ORAL_TABLET | Freq: Four times a day (QID) | ORAL | Status: AC | PRN

## 2011-12-18 NOTE — ED Notes (Signed)
Patient remains in CT 

## 2011-12-18 NOTE — ED Notes (Signed)
Patient back to room from radiology. Normal saline bolus restarted. No signs of infiltration. Denies needs. Pain 6\10. Denies nausea. Call bell within reach. Family with patient.

## 2011-12-18 NOTE — Discharge Instructions (Signed)
Abdominal Pain Abdominal pain can be caused by many things. Your caregiver decides the seriousness of your pain by an examination and possibly blood tests and X-rays. Many cases can be observed and treated at home. Most abdominal pain is not caused by a disease and will probably improve without treatment. However, in many cases, more time must pass before a clear cause of the pain can be found. Before that point, it may not be known if you need more testing, or if hospitalization or surgery is needed. HOME CARE INSTRUCTIONS   Do not take laxatives unless directed by your caregiver.   Take pain medicine only as directed by your caregiver.   Only take over-the-counter or prescription medicines for pain, discomfort, or fever as directed by your caregiver.   Try a clear liquid diet (broth, tea, or water) for as long as directed by your caregiver. Slowly move to a bland diet as tolerated.  SEEK IMMEDIATE MEDICAL CARE IF:   The pain does not go away.   You have a fever.   You keep throwing up (vomiting).   The pain is felt only in portions of the abdomen. Pain in the right side could possibly be appendicitis. In an adult, pain in the left lower portion of the abdomen could be colitis or diverticulitis.   You pass bloody or black tarry stools.  MAKE SURE YOU:   Understand these instructions.   Will watch your condition.   Will get help right away if you are not doing well or get worse.  Document Released: 06/12/2005 Document Revised: 08/22/2011 Document Reviewed: 04/20/2008 Exit[employer] Patient Information 2012 [employer]  RESOURCE GUIDE  Dental Problems  Patients with Medicaid: [facility]                     [facility] [address]                                           [address] Phone:  [phone]                                                  Phone:  [phone]  If unable to pay or uninsured, contact:  [facility] or [facility] to become qualified for the adult dental clinic.  Chronic Pain Problems Contact  [facility]  [phone] Patients need to be referred by their primary care doctor.  Insufficient Money for Medicine Contact [employer]:  call "211" or [facility] 271-5999.  No Primary Care Doctor Call [facility]  [phone] Other agencies that provide inexpensive medical care    [facility]  [phone]    [facility]  [phone]    [facility]  [phone]    Women[facility] Clinic  [phone]    [facility]  [phone]    [facility]  [phone]  Psychological Services [facility]  [phone] [facility]  [phone] [facility]   [phone] (emergency services [phone])  Substance Abuse Resources [facility]  336-882-2125 [facility] 336-784-9470 [facility] 336-285-9073 [facility] 336-845-3988 Residential & Outpatient Substance Abuse Program  800-659-3381    Abuse/Neglect [facility] Child Abuse Hotline 701-599-3552 [facility] Child Abuse Hotline 250-085-1191 (After Hours)  Emergency Shelter [facility] 432-645-1089  Maternity Homes [facility] [place] [facility] ([phone] [facility] 850-275-8551  MRSA Hotline #:   858 276 1287    [facility] Resources  [facility]     [employer]                          [facility] [address] [place]                       [address]      [address]  Blondell Reveal Phone:  [phone]                                   Phone:  [phone]                 Phone:  [phone]  [facility] Phone:  ([phone]  [facility] Child Abuse Hotline (979) 365-1690 985-853-1265 (After  Hours)  Return for new or worse symptoms CT scan without significant abnormalities today followup with your doctor or use resource guide to follow up to find another doctor. Take pain medicine as directed.

## 2011-12-18 NOTE — ED Provider Notes (Addendum)
Results for orders placed during the hospital encounter of 12/17/11  CBC      Component Value Range   WBC 5.7  4.0 - 10.5 (K/uL)   RBC 3.87  3.87 - 5.11 (MIL/uL)   Hemoglobin 10.2 (*) 12.0 - 15.0 (g/dL)   HCT 16.1 (*) 09.6 - 46.0 (%)   MCV 84.0  78.0 - 100.0 (fL)   MCH 26.4  26.0 - 34.0 (pg)   MCHC 31.4  30.0 - 36.0 (g/dL)   RDW 04.5  40.9 - 81.1 (%)   Platelets 262  150 - 400 (K/uL)  DIFFERENTIAL      Component Value Range   Neutrophils Relative 57  43 - 77 (%)   Neutro Abs 3.3  1.7 - 7.7 (K/uL)   Lymphocytes Relative 33  12 - 46 (%)   Lymphs Abs 1.9  0.7 - 4.0 (K/uL)   Monocytes Relative 8  3 - 12 (%)   Monocytes Absolute 0.4  0.1 - 1.0 (K/uL)   Eosinophils Relative 2  0 - 5 (%)   Eosinophils Absolute 0.1  0.0 - 0.7 (K/uL)   Basophils Relative 0  0 - 1 (%)   Basophils Absolute 0.0  0.0 - 0.1 (K/uL)  COMPREHENSIVE METABOLIC PANEL      Component Value Range   Sodium 139  135 - 145 (mEq/L)   Potassium 4.0  3.5 - 5.1 (mEq/L)   Chloride 101  96 - 112 (mEq/L)   CO2 30  19 - 32 (mEq/L)   Glucose, Bld 92  70 - 99 (mg/dL)   BUN 9  6 - 23 (mg/dL)   Creatinine, Ser 9.14  0.50 - 1.10 (mg/dL)   Calcium 9.4  8.4 - 78.2 (mg/dL)   Total Protein 6.6  6.0 - 8.3 (g/dL)   Albumin 2.9 (*) 3.5 - 5.2 (g/dL)   AST 7  0 - 37 (U/L)   ALT 6  0 - 35 (U/L)   Alkaline Phosphatase 54  39 - 117 (U/L)   Total Bilirubin 0.1 (*) 0.3 - 1.2 (mg/dL)   GFR calc non Af Amer >90  >90 (mL/min)   GFR calc Af Amer >90  >90 (mL/min)  LIPASE, BLOOD      Component Value Range   Lipase 21  11 - 59 (U/L)  URINALYSIS, ROUTINE W REFLEX MICROSCOPIC      Component Value Range   Color, Urine YELLOW  YELLOW    APPearance CLEAR  CLEAR    Specific Gravity, Urine 1.015  1.005 - 1.030    pH 7.0  5.0 - 8.0    Glucose, UA NEGATIVE  NEGATIVE (mg/dL)   Hgb urine dipstick TRACE (*) NEGATIVE    Bilirubin Urine NEGATIVE  NEGATIVE    Ketones, ur NEGATIVE  NEGATIVE (mg/dL)   Protein, ur NEGATIVE  NEGATIVE (mg/dL)   Urobilinogen, UA 0.2  0.0 - 1.0 (mg/dL)   Nitrite NEGATIVE  NEGATIVE    Leukocytes, UA NEGATIVE  NEGATIVE   URINE MICROSCOPIC-ADD ON      Component Value Range   Squamous Epithelial / LPF FEW (*) RARE    WBC, UA 3-6  <3 (WBC/hpf)   RBC / HPF 0-2  <3 (RBC/hpf)   Bacteria, UA FEW (*) RARE    Results for orders placed during the hospital encounter of 12/17/11  CBC      Component Value Range   WBC 5.7  4.0 - 10.5 (K/uL)   RBC 3.87  3.87 - 5.11 (MIL/uL)   Hemoglobin  10.2 (*) 12.0 - 15.0 (g/dL)   HCT 16.1 (*) 09.6 - 46.0 (%)   MCV 84.0  78.0 - 100.0 (fL)   MCH 26.4  26.0 - 34.0 (pg)   MCHC 31.4  30.0 - 36.0 (g/dL)   RDW 04.5  40.9 - 81.1 (%)   Platelets 262  150 - 400 (K/uL)  DIFFERENTIAL      Component Value Range   Neutrophils Relative 57  43 - 77 (%)   Neutro Abs 3.3  1.7 - 7.7 (K/uL)   Lymphocytes Relative 33  12 - 46 (%)   Lymphs Abs 1.9  0.7 - 4.0 (K/uL)   Monocytes Relative 8  3 - 12 (%)   Monocytes Absolute 0.4  0.1 - 1.0 (K/uL)   Eosinophils Relative 2  0 - 5 (%)   Eosinophils Absolute 0.1  0.0 - 0.7 (K/uL)   Basophils Relative 0  0 - 1 (%)   Basophils Absolute 0.0  0.0 - 0.1 (K/uL)  COMPREHENSIVE METABOLIC PANEL      Component Value Range   Sodium 139  135 - 145 (mEq/L)   Potassium 4.0  3.5 - 5.1 (mEq/L)   Chloride 101  96 - 112 (mEq/L)   CO2 30  19 - 32 (mEq/L)   Glucose, Bld 92  70 - 99 (mg/dL)   BUN 9  6 - 23 (mg/dL)   Creatinine, Ser 9.14  0.50 - 1.10 (mg/dL)   Calcium 9.4  8.4 - 78.2 (mg/dL)   Total Protein 6.6  6.0 - 8.3 (g/dL)   Albumin 2.9 (*) 3.5 - 5.2 (g/dL)   AST 7  0 - 37 (U/L)   ALT 6  0 - 35 (U/L)   Alkaline Phosphatase 54  39 - 117 (U/L)   Total Bilirubin 0.1 (*) 0.3 - 1.2 (mg/dL)   GFR calc non Af Amer >90  >90 (mL/min)   GFR calc Af Amer >90  >90 (mL/min)  LIPASE, BLOOD      Component Value Range   Lipase 21  11 - 59 (U/L)  URINALYSIS, ROUTINE W REFLEX MICROSCOPIC      Component Value Range   Color, Urine YELLOW  YELLOW    APPearance  CLEAR  CLEAR    Specific Gravity, Urine 1.015  1.005 - 1.030    pH 7.0  5.0 - 8.0    Glucose, UA NEGATIVE  NEGATIVE (mg/dL)   Hgb urine dipstick TRACE (*) NEGATIVE    Bilirubin Urine NEGATIVE  NEGATIVE    Ketones, ur NEGATIVE  NEGATIVE (mg/dL)   Protein, ur NEGATIVE  NEGATIVE (mg/dL)   Urobilinogen, UA 0.2  0.0 - 1.0 (mg/dL)   Nitrite NEGATIVE  NEGATIVE    Leukocytes, UA NEGATIVE  NEGATIVE   URINE MICROSCOPIC-ADD ON      Component Value Range   Squamous Epithelial / LPF FEW (*) RARE    WBC, UA 3-6  <3 (WBC/hpf)   RBC / HPF 0-2  <3 (RBC/hpf)   Bacteria, UA FEW (*) RARE    Dg Chest 2 View  11/27/2011  *RADIOLOGY REPORT*  Clinical Data: Nausea  CHEST - 2 VIEW  Comparison: 02/17/2011  Findings: Normal heart size.  Clear lungs.  No pneumothorax or pleural effusion.  IMPRESSION: No active cardiopulmonary disease.  Original Report Authenticated By: Donavan Burnet, M.D.   Ct Abdomen Pelvis W Contrast  12/18/2011  *RADIOLOGY REPORT*  Clinical Data: Abdominal pain, weakness and blood in stool.  CT ABDOMEN AND PELVIS WITH CONTRAST  Technique:  Multidetector CT imaging of the abdomen and pelvis was performed following the standard protocol during bolus administration of intravenous contrast.  Contrast:  100 ml Omnipaque-300  Comparison: CT scan 10/26/2005.  Findings: The lung bases are clear.  No pleural effusion.  The liver demonstrates minimal biliary dilatation likely due to previous cholecystectomy.  Common bile duct is within normal limits in caliber.  There is a stable low attenuation lesion in the right hepatic lobe.  The spleen is normal in size.  No focal lesions. The adrenal glands and kidneys are unremarkable.  The stomach, duodenum, small bowel and colon are unremarkable.  No inflammatory changes or mass lesions.  Minimal scattered sigmoid diverticulosis without findings for acute diverticulitis.  No mesenteric or retroperitoneal masses or adenopathy.  The aorta is normal in caliber.  Mild  atherosclerotic changes.  The major branch vessels are patent.  The uterus is surgically absent.  The left ovary is still present and appears normal.  No pelvic mass, adenopathy or free pelvic fluid collections.  The bladder is normal.  No inguinal mass or hernia.  The bony pelvis is intact.  The pubic symphysis and SI joints are intact.  IMPRESSION: Unremarkable CT abdomen/pelvis.  No acute abdominal/pelvic findings. Mild post cholecystectomy intrahepatic biliary dilatation. Stable low attenuation right hepatic lobe lesion.  Original Report Authenticated By: P. Loralie Champagne, M.D.    CT scan results without any significant findings etiology of patient's abdominal pain is not clear. Will discharge home with pain medication also no evidence of constipation on the CT scan.     Shelda Jakes, MD 12/18/11 0028  This is an follow up note, for Dr Fredricka Bonine, not seen primarily by me, no HPI or PE required.  Shelda Jakes, MD 05/30/12 [phone]

## 2011-12-18 NOTE — ED Notes (Signed)
MD at bedside to evaluate.

## 2011-12-24 ENCOUNTER — Emergency Department (HOSPITAL_COMMUNITY): Payer: Medicaid - Out of State

## 2011-12-24 ENCOUNTER — Encounter (HOSPITAL_COMMUNITY): Payer: Self-pay | Admitting: *Deleted

## 2011-12-24 ENCOUNTER — Emergency Department (HOSPITAL_COMMUNITY)
Admission: EM | Admit: 2011-12-24 | Discharge: 2011-12-24 | Disposition: A | Discharge status: Home / Self Care | Payer: Medicaid - Out of State | Attending: Emergency Medicine | Admitting: Emergency Medicine

## 2011-12-24 DIAGNOSIS — I10 Essential (primary) hypertension: Secondary | ICD-10-CM | POA: Insufficient documentation

## 2011-12-24 DIAGNOSIS — R9431 Abnormal electrocardiogram [ECG] [EKG]: Secondary | ICD-10-CM | POA: Insufficient documentation

## 2011-12-24 DIAGNOSIS — R079 Chest pain, unspecified: Secondary | ICD-10-CM | POA: Insufficient documentation

## 2011-12-24 DIAGNOSIS — E785 Hyperlipidemia, unspecified: Secondary | ICD-10-CM | POA: Insufficient documentation

## 2011-12-24 DIAGNOSIS — K219 Gastro-esophageal reflux disease without esophagitis: Secondary | ICD-10-CM | POA: Insufficient documentation

## 2011-12-24 DIAGNOSIS — F319 Bipolar disorder, unspecified: Secondary | ICD-10-CM | POA: Insufficient documentation

## 2011-12-24 DIAGNOSIS — R110 Nausea: Secondary | ICD-10-CM

## 2011-12-24 DIAGNOSIS — Z9889 Other specified postprocedural states: Secondary | ICD-10-CM | POA: Insufficient documentation

## 2011-12-24 LAB — DIFFERENTIAL
Eosinophils Absolute: 0.2 10*3/uL (ref 0.0–0.7)
Lymphs Abs: 2 10*3/uL (ref 0.7–4.0)
Monocytes Absolute: 0.4 10*3/uL (ref 0.1–1.0)
Monocytes Relative: 7 % (ref 3–12)
Neutrophils Relative %: 59 % (ref 43–77)

## 2011-12-24 LAB — BASIC METABOLIC PANEL
BUN: 15 mg/dL (ref 6–23)
Creatinine, Ser: 0.9 mg/dL (ref 0.50–1.10)
GFR calc non Af Amer: 74 mL/min — ABNORMAL LOW (ref >90)
Glucose, Bld: 93 mg/dL (ref 70–99)
Potassium: 3.8 mEq/L (ref 3.5–5.1)

## 2011-12-24 LAB — CK TOTAL AND CKMB (NOT AT ARMC)
CK, MB: 1.6 ng/mL (ref 0.3–4.0)
Relative Index: 1.4 (ref 0.0–2.5)

## 2011-12-24 LAB — CBC
HCT: 34 % — ABNORMAL LOW (ref 36.0–46.0)
Hemoglobin: 10.8 g/dL — ABNORMAL LOW (ref 12.0–15.0)
MCH: 26.2 pg (ref 26.0–34.0)
MCHC: 31.8 g/dL (ref 30.0–36.0)
MCV: 82.3 fL (ref 78.0–100.0)
RBC: 4.13 MIL/uL (ref 3.87–5.11)

## 2011-12-24 MED ORDER — HYDROCODONE-ACETAMINOPHEN 5-325 MG PO TABS: 1.0000 | ORAL_TABLET | ORAL | Status: AC | PRN

## 2011-12-24 NOTE — ED Notes (Signed)
Pt reports intermittent episodes of substernal cp radiating to left arm starting last night

## 2011-12-24 NOTE — ED Provider Notes (Signed)
History     CSN: [id number]  Arrival date & time 12/24/11  0039   First MD Initiated Contact with Patient 12/24/11 [phone]      Chief Complaint  Patient presents with  . Chest Pain    (Consider location/radiation/quality/duration/timing/severity/associated sxs/prior treatment) HPI Comments: [name] is a 51 y.o. Female who presents with 36 hours of persistent chest pain that waxes and wanes. She also has nausea without vomiting. She denies shortness of breath, weakness, dizziness, back pain, or presyncope. The chest pain, occasionally radiates to the left shoulder. She has chronic reflux symptoms and has been taking her usual medicines without relief. She has not tried any antacids The history is provided by the patient.    Past Medical History  Diagnosis Date  . Hypertension   . Hyperlipemia   . GERD (gastroesophageal reflux disease)   . Bipolar 1 disorder     Past Surgical History  Procedure Date  . Cholecystectomy   . Appendectomy   . Abdominal hysterectomy     No family history on file.  History  Substance Use Topics  . Smoking status: Never Smoker   . Smokeless tobacco: Not on file  . Alcohol Use: No    OB History    Grav Para Term Preterm Abortions TAB SAB Ect Mult Living                  Review of Systems  All other systems reviewed and are negative.    Allergies  Ibuprofen and Macrobid  Home Medications   Current Outpatient Rx  Name Route Sig Dispense Refill  . AMLODIPINE BESYLATE 10 MG PO TABS Oral Take 10 mg by mouth daily.    Marland Kitchen CLONAZEPAM 0.5 MG PO TABS Oral Take 0.5 mg by mouth 2 (two) times daily as needed.    . CYCLOBENZAPRINE HCL 10 MG PO TABS Oral Take 10 mg by mouth 3 (three) times daily as needed. For muscle spasms in back    . DIVALPROEX SODIUM ER 500 MG PO TB24 Oral Take 1,000 mg by mouth at bedtime.    Marland Kitchen HYDROCODONE-ACETAMINOPHEN 5-325 MG PO TABS Oral Take 1-2 tablets by mouth every 6 (six) hours as needed for pain. 10 tablet 0  .  HYDROCODONE-ACETAMINOPHEN 5-325 MG PO TABS Oral Take 1 tablet by mouth every 4 (four) hours as needed for pain. 20 tablet 0  . LISINOPRIL 20 MG PO TABS Oral Take 20 mg by mouth daily.    Marland Kitchen PANTOPRAZOLE SODIUM 40 MG PO TBEC Oral Take 40 mg by mouth daily.    Marland Kitchen PRAVASTATIN SODIUM 40 MG PO TABS Oral Take 40 mg by mouth daily.    Marland Kitchen PROPRANOLOL HCL 80 MG PO TABS Oral Take 80 mg by mouth daily.    . TRAMADOL HCL 50 MG PO TABS Oral Take 50 mg by mouth every 6 (six) hours as needed. For back spasms    . TRAZODONE HCL 150 MG PO TABS Oral Take 300 mg by mouth at bedtime.      BP 133/82  Pulse 77  Resp 20  Ht 5\' 3"  (1.6 m)  Wt 235 lb (106.595 kg)  BMI 41.63 kg/m2  SpO2 99%  Physical Exam  Nursing note and vitals reviewed. Constitutional: She is oriented to person, place, and time. She appears well-developed and well-nourished.  HENT:  Head: Normocephalic and atraumatic.  Eyes: Conjunctivae and EOM are normal. Pupils are equal, round, and reactive to light.  Neck: Normal range of motion  and phonation normal. Neck supple.  Cardiovascular: Normal rate, regular rhythm and intact distal pulses.   Pulmonary/Chest: Effort normal and breath sounds normal. She exhibits no tenderness.       Mild anterior chest wall tenderness  Abdominal: Soft. She exhibits no distension. There is no tenderness. There is no guarding.  Musculoskeletal: Normal range of motion. She exhibits no edema and no tenderness.  Neurological: She is alert and oriented to person, place, and time. She has normal strength. She exhibits normal muscle tone.  Skin: Skin is warm and dry.  Psychiatric: She has a normal mood and affect. Her behavior is normal. Judgment and thought content normal.    ED Course  Procedures (including critical care time)  Labs Reviewed  CBC - Abnormal; Notable for the following:    Hemoglobin 10.8 (*)    HCT 34.0 (*)    All other components within normal limits  BASIC METABOLIC PANEL - Abnormal; Notable  for the following:    GFR calc non Af Amer 74 (*)    GFR calc Af Amer 86 (*)    All other components within normal limits  DIFFERENTIAL  TROPONIN I  CK TOTAL AND CKMB   Dg Chest 2 View  12/24/2011  *RADIOLOGY REPORT*  Clinical Data: Chest pain.  CHEST - 2 VIEW  Comparison: Chest radiograph performed 11/26/2011  Findings: The lungs are well-aerated and clear.  There is no evidence of focal opacification, pleural effusion or pneumothorax.  The heart is normal in size; the mediastinal contour is within normal limits.  No acute osseous abnormalities are seen.  Clips are noted within the right upper quadrant, reflecting prior cholecystectomy.  IMPRESSION: No acute cardiopulmonary process seen.  Original Report Authenticated By: Tonia Ghent, M.D.    Date: 12/24/2011  Rate: 87  Rhythm: normal sinus rhythm  QRS Axis: normal  Intervals: prolonged QT  ST/T Wave abnormalities: normal  Conduction Disutrbances:none  Narrative Interpretation:   Old EKG Reviewed: unchanged    1. Chest pain   2. Nausea       MDM  Nonspecific chest pain, and nausea with negative EP evaluation. She is PERC negative. Doubt ACS, PE, pneumonia, metabolic instability or occult infection.symptoms are consistent with exacerbation of her gastroesophageal reflux disease.    Plan: Home Medications- Norco, Maalox; Home Treatments- rest; Recommended follow up- find PCP to see asap    Flint Melter, MD 12/24/11 [phone]

## 2011-12-24 NOTE — Discharge Instructions (Signed)
Take Maalox ( 2 Tablespoons) before meals and at bedtime. Use a resource guide to find a Dr. to see for further treatment.   Chest Pain (Nonspecific) Chest pain has many causes. Your pain could be caused by something serious, such as a heart attack or a blood clot in the lungs. It could also be caused by something less serious, such as a chest bruise or a virus. Follow up with your doctor. More lab tests or other studies may be needed to find the cause of your pain. Most of the time, nonspecific chest pain will improve within 2 to 3 days of rest and mild pain medicine. HOME CARE  For chest bruises, you may put ice on the sore area for 15 to 20 minutes, 3 to 4 times a day. Do this only if it makes you or your child feel better.   Put ice in a plastic bag.   Place a towel between the skin and the bag.   Rest for the next 2 to 3 days.   Go back to work if the pain improves.   See your doctor if the pain lasts longer than 1 to 2 weeks.   Only take medicine as told by your doctor.   Quit smoking if you smoke.  GET HELP RIGHT AWAY IF:   There is more pain or pain that spreads to the arm, neck, jaw, back, or belly (abdomen).   You or your child has shortness of breath.   You or your child coughs more than usual or coughs up blood.   You or your child has very bad back or belly pain, feels sick to his or her stomach (nauseous), or throws up (vomits).   You or your child has very bad weakness.   You or your child passes out (faints).   You or your child has a temperature by mouth above 102 F (38.9 C), not controlled by medicine.  Any of these problems may be serious and may be an emergency. Do not wait to see if the problems will go away. Get medical help right away. Call your local emergency services 911 in [place] Do not drive yourself to the hospital. MAKE SURE YOU:   Understand these instructions.   Will watch this condition.   Will get help right away if you or your child is  not doing well or gets worse.  Document Released: 02/19/2008 Document Revised: 08/22/2011 Document Reviewed: 02/19/2008 [facility] Patient Information 2012 [place], [place].Nausea and Vomiting Nausea is a sick feeling that often comes before throwing up (vomiting). Vomiting is a reflex where stomach contents come out of your mouth. Vomiting can cause severe loss of body fluids (dehydration). Children and elderly adults can become dehydrated quickly, especially if they also have diarrhea. Nausea and vomiting are symptoms of a condition or disease. It is important to find the cause of your symptoms. CAUSES   Direct irritation of the stomach lining. This irritation can result from increased acid production (gastroesophageal reflux disease), infection, food poisoning, taking certain medicines (such as nonsteroidal anti-inflammatory drugs), alcohol use, or tobacco use.   Signals from the brain.These signals could be caused by a headache, heat exposure, an inner ear disturbance, increased pressure in the brain from injury, infection, a tumor, or a concussion, pain, emotional stimulus, or metabolic problems.   An obstruction in the gastrointestinal tract (bowel obstruction).   Illnesses such as diabetes, hepatitis, gallbladder problems, appendicitis, kidney problems, cancer, sepsis, atypical symptoms of a heart attack, or  eating disorders.   Medical treatments such as chemotherapy and radiation.   Receiving medicine that makes you sleep (general anesthetic) during surgery.  DIAGNOSIS Your caregiver may ask for tests to be done if the problems do not improve after a few days. Tests may also be done if symptoms are severe or if the reason for the nausea and vomiting is not clear. Tests may include:  Urine tests.   Blood tests.   Stool tests.   Cultures (to look for evidence of infection).   X-rays or other imaging studies.  Test results can help your caregiver make decisions about treatment or the  need for additional tests. TREATMENT You need to stay well hydrated. Drink frequently but in small amounts.You may wish to drink water, sports drinks, clear broth, or eat frozen ice pops or gelatin dessert to help stay hydrated.When you eat, eating slowly may help prevent nausea.There are also some antinausea medicines that may help prevent nausea. HOME CARE INSTRUCTIONS   Take all medicine as directed by your caregiver.   If you do not have an appetite, do not force yourself to eat. However, you must continue to drink fluids.   If you have an appetite, eat a normal diet unless your caregiver tells you differently.   Eat a variety of complex carbohydrates (rice, wheat, potatoes, bread), lean meats, yogurt, fruits, and vegetables.   Avoid high-fat foods because they are more difficult to digest.   Drink enough water and fluids to keep your urine clear or pale yellow.   If you are dehydrated, ask your caregiver for specific rehydration instructions. Signs of dehydration may include:   Severe thirst.   Dry lips and mouth.   Dizziness.   Dark urine.   Decreasing urine frequency and amount.   Confusion.   Rapid breathing or pulse.  SEEK IMMEDIATE MEDICAL CARE IF:   You have blood or brown flecks (like coffee grounds) in your vomit.   You have black or bloody stools.   You have a severe headache or stiff neck.   You are confused.   You have severe abdominal pain.   You have chest pain or trouble breathing.   You do not urinate at least once every 8 hours.   You develop cold or clammy skin.   You continue to vomit for longer than 24 to 48 hours.   You have a fever.  MAKE SURE YOU:   Understand these instructions.   Will watch your condition.   Will get help right away if you are not doing well or get worse.  Document Released: 09/02/2005 Document Revised: 08/22/2011 Document Reviewed: 01/30/2011 [facility] Patient Information 2012 [place], [place].  RESOURCE  GUIDE  Dental Problems  Patients with Medicaid: [facility] [phone] [address]                                           [phone] [address] Phone:  [phone]                                                  Phone:  [phone]  If unable to pay or uninsured, contact:  [facility] or [facility]. to become qualified for the adult dental clinic.  Chronic Pain Problems Contact [facility]  [phone] Patients need to be referred by their primary care doctor.  Insufficient Money for Medicine Contact [employer]:  call "211" or [facility] 774-551-8640.  No Primary Care Doctor Call [facility]  2190275216 Other agencies that provide inexpensive medical care    [facility]  [phone]    [facility]  [phone]    [facility]  [phone]    [facility]  [phone]    [facility]  [phone]    [facility]  [phone]  Psychological Services [facility] Behavioral Health  [phone] [facility] Services  [phone] [facility] Mental Health   [phone] (emergency services [phone])  Substance Abuse Resources [facility]  5804628925 [facility] [phone] [facility] [place] [phone] Floydene Flock [phone] Residential & Outpatient Substance Abuse Program  [phone]  Abuse/Neglect [facility] Child Abuse Hotline 620-645-3658 [facility] Child Abuse Hotline 4312242482 (After Hours)  Emergency Shelter [facility] ([phone]  Maternity Homes Room at the [place] [facility] [place] 530-284-4372 [facility] [phone]  MRSA Hotline #:   843-363-8667    [facility] Resources  [facility]     [employer]                          [facility]. [address] [place]                       [address]       [address]  Blondell Reveal Phone:  [phone]                                   Phone:  [phone]                 Phone:  [phone]  [facility] Mental Health Phone:  [phone]  [facility] Child Abuse Hotline 332-344-2394 [phone] (After Hours)

## 2012-01-30 ENCOUNTER — Emergency Department (HOSPITAL_COMMUNITY)
Admission: EM | Admit: 2012-01-30 | Discharge: 2012-01-31 | Disposition: A | Discharge status: Home / Self Care | Payer: Medicaid - Out of State | Attending: Emergency Medicine | Admitting: Emergency Medicine

## 2012-01-30 ENCOUNTER — Encounter (HOSPITAL_COMMUNITY): Payer: Self-pay | Admitting: *Deleted

## 2012-01-30 DIAGNOSIS — Z79899 Other long term (current) drug therapy: Secondary | ICD-10-CM | POA: Insufficient documentation

## 2012-01-30 DIAGNOSIS — R109 Unspecified abdominal pain: Secondary | ICD-10-CM | POA: Insufficient documentation

## 2012-01-30 DIAGNOSIS — R51 Headache: Secondary | ICD-10-CM

## 2012-01-30 DIAGNOSIS — F319 Bipolar disorder, unspecified: Secondary | ICD-10-CM | POA: Insufficient documentation

## 2012-01-30 DIAGNOSIS — I10 Essential (primary) hypertension: Secondary | ICD-10-CM | POA: Insufficient documentation

## 2012-01-30 DIAGNOSIS — K219 Gastro-esophageal reflux disease without esophagitis: Secondary | ICD-10-CM | POA: Insufficient documentation

## 2012-01-30 DIAGNOSIS — E785 Hyperlipidemia, unspecified: Secondary | ICD-10-CM | POA: Insufficient documentation

## 2012-01-30 NOTE — ED Notes (Signed)
Pt reports headache starting this am and also severe stomach cramping

## 2012-01-30 NOTE — ED Provider Notes (Signed)
History   This chart was scribed for [employer]. Colon Branch, MD by Shari Heritage. The patient was seen in room APA14/APA14. Patient's care was started at 2209.     CSN: [id number]  Arrival date & time 01/30/12  2209   First MD Initiated Contact with Patient 01/30/12 2350      Chief Complaint  Patient presents with  . Headache  . GI Problem    (Consider location/radiation/quality/duration/timing/severity/associated sxs/prior treatment) The history is provided by the patient. No language interpreter was used.   [name] is a 51 y.o. female who presents to the Emergency Department complaining of severe, constant, throbbing headache onset this morning and mild abdominal cramping onset several hours ago with associated nausea, dizziness and visual disturbance. Patient says she is seeing spots and dots. Patient has not taken any medications for the headache or abdominal pain. Patient denies vomiting or diarrhea. Patient says she has had one migraine before, but it was not as severe. Family has a history of migraines. Patient with h/o of HTN, hyperlipidemia, GERD, bipolar disorder, cholecystectomy, appendectomy and abdominal hysterectomy.  PCP - [facility])  Past Medical History  Diagnosis Date  . Hypertension   . Hyperlipemia   . GERD (gastroesophageal reflux disease)   . Bipolar 1 disorder     Past Surgical History  Procedure Date  . Cholecystectomy   . Appendectomy   . Abdominal hysterectomy     No family history on file.  History  Substance Use Topics  . Smoking status: Never Smoker   . Smokeless tobacco: Not on file  . Alcohol Use: No    OB History    Grav Para Term Preterm Abortions TAB SAB Ect Mult Living                  Review of Systems  Constitutional: Negative for fever.       10 Systems reviewed and are negative for acute change except as noted in the HPI.  HENT: Negative for congestion.   Eyes: Negative for discharge and redness.    Respiratory: Negative for cough and shortness of breath.   Cardiovascular: Negative for chest pain.  Gastrointestinal: Positive for abdominal pain. Negative for vomiting.  Musculoskeletal: Negative for back pain.  Skin: Negative for rash.  Neurological: Positive for headaches. Negative for syncope and numbness.  Psychiatric/Behavioral:       No behavior change.     Allergies  Ibuprofen and Nitrofurantoin monohyd macro  Home Medications   Current Outpatient Rx  Name Route Sig Dispense Refill  . AMLODIPINE BESYLATE 10 MG PO TABS Oral Take 10 mg by mouth at bedtime.     Marland Kitchen DIVALPROEX SODIUM ER 500 MG PO TB24 Oral Take 1,000 mg by mouth at bedtime.    Marland Kitchen LISINOPRIL 20 MG PO TABS Oral Take 20 mg by mouth daily.    Marland Kitchen PANTOPRAZOLE SODIUM 40 MG PO TBEC Oral Take 40 mg by mouth daily.    Marland Kitchen PRAVASTATIN SODIUM 40 MG PO TABS Oral Take 40 mg by mouth daily.    Marland Kitchen PROPRANOLOL HCL 80 MG PO TABS Oral Take 80 mg by mouth daily.    . TRAZODONE HCL 150 MG PO TABS Oral Take 300 mg by mouth at bedtime.      BP 185/82  Pulse 90  Temp(Src) 98 F (36.7 C) (Oral)  Resp 18  Ht 5\' 3"  (1.6 m)  Wt 241 lb (109.317 kg)  BMI 42.69 kg/m2  SpO2 100%  Physical Exam  Nursing note and vitals reviewed. Constitutional: She is oriented to person, place, and time. She appears well-developed and well-nourished.  HENT:  Head: Normocephalic and atraumatic.  Eyes: Conjunctivae and EOM are normal. Pupils are equal, round, and reactive to light.  Neck: Normal range of motion. Neck supple.  Cardiovascular: Normal rate and regular rhythm.   Pulmonary/Chest: Effort normal and breath sounds normal.  Abdominal: Soft. Bowel sounds are normal. There is no tenderness.  Musculoskeletal: Normal range of motion.  Neurological: She is alert and oriented to person, place, and time.       Talking normally. Face is symmetrical. Tongue is midline.   Skin: Skin is warm and dry.  Psychiatric: She has a normal mood and affect.     ED Course  Procedures (including critical care time) DIAGNOSTIC STUDIES: Oxygen Saturation is 100% on room air, normal by my interpretation.    COORDINATION OF CARE: 12:02AM- Patient informed of current plan for treatment and evaluation and agrees with plan at this time.   0150 Patient states headache , nausea, and abdominal pain have resolved. She is ready for discharge.       MDM  Patient presents with headache and abdominal cramping that began earlier today. She taken no medicines. She was given IV fluids, analgesic, Benadryl, Zofran, and Reglan relief of her symptoms. She has taken by mouth fluids.  Pt feels improved after observation and/or treatment in ED.Pt stable in ED with no significant deterioration in condition.The patient appears reasonably screened and/or stabilized for discharge and I doubt any other medical condition or other [facility] requiring further screening, evaluation, or treatment in the ED at this time prior to discharge.  I personally performed the services described in this documentation, which was scribed in my presence. The recorded information has been reviewed and considered.   MDM Reviewed: nursing note and vitals           Nicoletta Dress. Colon Branch, MD 01/31/12 6213

## 2012-01-31 MED ORDER — HYDROMORPHONE HCL PF 1 MG/ML IJ SOLN
1.0000 mg | Freq: Once | INTRAMUSCULAR | Status: AC
  Administered 2012-01-31: 1 mg via INTRAVENOUS
  Filled 2012-01-31: qty 1

## 2012-01-31 MED ORDER — PROMETHAZINE HCL 25 MG PO TABS: 12.5000 mg | ORAL_TABLET | Freq: Four times a day (QID) | ORAL | Status: DC | PRN

## 2012-01-31 MED ORDER — METOCLOPRAMIDE HCL 5 MG/ML IJ SOLN
10.0000 mg | Freq: Once | INTRAMUSCULAR | Status: AC
  Administered 2012-01-31: 10 mg via INTRAVENOUS
  Filled 2012-01-31: qty 2

## 2012-01-31 MED ORDER — SODIUM CHLORIDE 0.9 % IV BOLUS (SEPSIS)
1000.0000 mL | Freq: Once | INTRAVENOUS | Status: AC
  Administered 2012-01-31: 1000 mL via INTRAVENOUS

## 2012-01-31 MED ORDER — DIPHENHYDRAMINE HCL 50 MG/ML IJ SOLN
25.0000 mg | Freq: Once | INTRAMUSCULAR | Status: AC
  Administered 2012-01-31: 25 mg via INTRAVENOUS
  Filled 2012-01-31: qty 1

## 2012-01-31 MED ORDER — ONDANSETRON HCL 4 MG/2ML IJ SOLN
4.0000 mg | Freq: Once | INTRAMUSCULAR | Status: AC
  Administered 2012-01-31: 4 mg via INTRAVENOUS
  Filled 2012-01-31: qty 2

## 2012-01-31 NOTE — Discharge Instructions (Signed)
You may use Tylenol for headaches. Use the nausea medicine as needed. Continue your home medications. Followup with your doctor.   Abdominal Pain (Nonspecific) Your exam might not show the exact reason you have abdominal pain. Since there are many different causes of abdominal pain, another checkup and more tests may be needed. It is very important to follow up for lasting (persistent) or worsening symptoms. A possible cause of abdominal pain in any person who still has his or her appendix is acute appendicitis. Appendicitis is often hard to diagnose. Normal blood tests, urine tests, ultrasound, and CT scans do not completely rule out early appendicitis or other causes of abdominal pain. Sometimes, only the changes that happen over time will allow appendicitis and other causes of abdominal pain to be determined. Other potential problems that may require surgery may also take time to become more apparent. Because of this, it is important that you follow all of the instructions below. HOME CARE INSTRUCTIONS   Rest as much as possible.   Do not eat solid food until your pain is gone.   While adults or children have pain: A diet of water, weak decaffeinated tea, broth or bouillon, gelatin, oral rehydration solutions (ORS), frozen ice pops, or ice chips may be helpful.   When pain is gone in adults or children: Start a light diet (dry toast, crackers, applesauce, or white rice). Increase the diet slowly as long as it does not bother you. Eat no dairy products (including cheese and eggs) and no spicy, fatty, fried, or high-fiber foods.   Use no alcohol, caffeine, or cigarettes.   Take your regular medicines unless your caregiver told you not to.   Take any prescribed medicine as directed.   Only take over-the-counter or prescription medicines for pain, discomfort, or fever as directed by your caregiver. Do not give aspirin to children.  If your caregiver has given you a follow-up appointment, it is  very important to keep that appointment. Not keeping the appointment could result in a permanent injury and/or lasting (chronic) pain and/or disability. If there is any problem keeping the appointment, you must call to reschedule.  SEEK IMMEDIATE MEDICAL CARE IF:   Your pain is not gone in 24 hours.   Your pain becomes worse, changes location, or feels different.   You or your child has an oral temperature above 102 F (38.9 C), not controlled by medicine.   Your baby is older than 3 months with a rectal temperature of 102 F (38.9 C) or higher.   Your baby is 105 months old or younger with a rectal temperature of 100.4 F (38 C) or higher.   You have shaking chills.   You keep throwing up (vomiting) or cannot drink liquids.   There is blood in your vomit or you see blood in your bowel movements.   Your bowel movements become dark or black.   You have frequent bowel movements.   Your bowel movements stop (become blocked) or you cannot pass gas.   You have bloody, frequent, or painful urination.   You have yellow discoloration in the skin or whites of the eyes.   Your stomach becomes bloated or bigger.   You have dizziness or fainting.   You have chest or back pain.  MAKE SURE YOU:   Understand these instructions.   Will watch your condition.   Will get help right away if you are not doing well or get worse.  Document Released: 09/02/2005 Document Revised: 08/22/2011  Document Reviewed: 07/31/2009 [facility] Patient Information 2012 [place], [place].Abdominal Pain (Nonspecific) Your exam might not show the exact reason you have abdominal pain. Since there are many different causes of abdominal pain, another checkup and more tests may be needed. It is very important to follow up for lasting (persistent) or worsening symptoms. A possible cause of abdominal pain in any person who still has his or her appendix is acute appendicitis. Appendicitis is often hard to diagnose. Normal  blood tests, urine tests, ultrasound, and CT scans do not completely rule out early appendicitis or other causes of abdominal pain. Sometimes, only the changes that happen over time will allow appendicitis and other causes of abdominal pain to be determined. Other potential problems that may require surgery may also take time to become more apparent. Because of this, it is important that you follow all of the instructions below. HOME CARE INSTRUCTIONS   Rest as much as possible.   Do not eat solid food until your pain is gone.   While adults or children have pain: A diet of water, weak decaffeinated tea, broth or bouillon, gelatin, oral rehydration solutions (ORS), frozen ice pops, or ice chips may be helpful.   When pain is gone in adults or children: Start a light diet (dry toast, crackers, applesauce, or white rice). Increase the diet slowly as long as it does not bother you. Eat no dairy products (including cheese and eggs) and no spicy, fatty, fried, or high-fiber foods.   Use no alcohol, caffeine, or cigarettes.   Take your regular medicines unless your caregiver told you not to.   Take any prescribed medicine as directed.   Only take over-the-counter or prescription medicines for pain, discomfort, or fever as directed by your caregiver. Do not give aspirin to children.  If your caregiver has given you a follow-up appointment, it is very important to keep that appointment. Not keeping the appointment could result in a permanent injury and/or lasting (chronic) pain and/or disability. If there is any problem keeping the appointment, you must call to reschedule.  SEEK IMMEDIATE MEDICAL CARE IF:   Your pain is not gone in 24 hours.   Your pain becomes worse, changes location, or feels different.   You or your child has an oral temperature above 102 F (38.9 C), not controlled by medicine.   Your baby is older than 3 months with a rectal temperature of 102 F (38.9 C) or higher.    Your baby is 21 months old or younger with a rectal temperature of 100.4 F (38 C) or higher.   You have shaking chills.   You keep throwing up (vomiting) or cannot drink liquids.   There is blood in your vomit or you see blood in your bowel movements.   Your bowel movements become dark or black.   You have frequent bowel movements.   Your bowel movements stop (become blocked) or you cannot pass gas.   You have bloody, frequent, or painful urination.   You have yellow discoloration in the skin or whites of the eyes.   Your stomach becomes bloated or bigger.   You have dizziness or fainting.   You have chest or back pain.  MAKE SURE YOU:   Understand these instructions.   Will watch your condition.   Will get help right away if you are not doing well or get worse.  Document Released: 09/02/2005 Document Revised: 08/22/2011 Document Reviewed: 07/31/2009 [facility] Patient Information 2012 [place], [place].

## 2012-04-07 ENCOUNTER — Inpatient Hospital Stay (HOSPITAL_COMMUNITY)
Admission: EM | Admit: 2012-04-07 | Discharge: 2012-04-08 | DRG: 379 | Discharge status: Against Medical Advice | Payer: Medicaid - Out of State | Attending: Internal Medicine | Admitting: Internal Medicine

## 2012-04-07 ENCOUNTER — Encounter (HOSPITAL_COMMUNITY): Payer: Self-pay | Admitting: *Deleted

## 2012-04-07 ENCOUNTER — Encounter (HOSPITAL_COMMUNITY): Payer: Self-pay | Admitting: Pharmacy Technician

## 2012-04-07 DIAGNOSIS — I2510 Atherosclerotic heart disease of native coronary artery without angina pectoris: Secondary | ICD-10-CM | POA: Diagnosis present

## 2012-04-07 DIAGNOSIS — K219 Gastro-esophageal reflux disease without esophagitis: Secondary | ICD-10-CM

## 2012-04-07 DIAGNOSIS — D649 Anemia, unspecified: Secondary | ICD-10-CM | POA: Diagnosis present

## 2012-04-07 DIAGNOSIS — R109 Unspecified abdominal pain: Secondary | ICD-10-CM | POA: Diagnosis present

## 2012-04-07 DIAGNOSIS — K922 Gastrointestinal hemorrhage, unspecified: Secondary | ICD-10-CM

## 2012-04-07 DIAGNOSIS — K921 Melena: Secondary | ICD-10-CM

## 2012-04-07 DIAGNOSIS — F3289 Other specified depressive episodes: Secondary | ICD-10-CM

## 2012-04-07 DIAGNOSIS — Z8719 Personal history of other diseases of the digestive system: Secondary | ICD-10-CM

## 2012-04-07 DIAGNOSIS — Z9079 Acquired absence of other genital organ(s): Secondary | ICD-10-CM

## 2012-04-07 DIAGNOSIS — I10 Essential (primary) hypertension: Secondary | ICD-10-CM

## 2012-04-07 DIAGNOSIS — F319 Bipolar disorder, unspecified: Secondary | ICD-10-CM

## 2012-04-07 DIAGNOSIS — Z888 Allergy status to other drugs, medicaments and biological substances status: Secondary | ICD-10-CM

## 2012-04-07 DIAGNOSIS — K625 Hemorrhage of anus and rectum: Principal | ICD-10-CM

## 2012-04-07 DIAGNOSIS — Z9089 Acquired absence of other organs: Secondary | ICD-10-CM

## 2012-04-07 DIAGNOSIS — E785 Hyperlipidemia, unspecified: Secondary | ICD-10-CM

## 2012-04-07 DIAGNOSIS — Z90710 Acquired absence of both cervix and uterus: Secondary | ICD-10-CM

## 2012-04-07 DIAGNOSIS — F329 Major depressive disorder, single episode, unspecified: Secondary | ICD-10-CM

## 2012-04-07 LAB — POCT I-STAT, CHEM 8
Calcium, Ion: 1.23 mmol/L (ref 1.12–1.23)
Chloride: 101 mEq/L (ref 96–112)
Glucose, Bld: 112 mg/dL — ABNORMAL HIGH (ref 70–99)
HCT: 33 % — ABNORMAL LOW (ref 36.0–46.0)
TCO2: 27 mmol/L (ref 0–100)

## 2012-04-07 LAB — COMPREHENSIVE METABOLIC PANEL
ALT: 9 U/L (ref 0–35)
Alkaline Phosphatase: 55 U/L (ref 39–117)
BUN: 7 mg/dL (ref 6–23)
CO2: 31 mEq/L (ref 19–32)
Chloride: 98 mEq/L (ref 96–112)
GFR calc Af Amer: >90 mL/min (ref >90)
GFR calc non Af Amer: 78 mL/min — ABNORMAL LOW (ref >90)
Glucose, Bld: 112 mg/dL — ABNORMAL HIGH (ref 70–99)
Potassium: 3.6 mEq/L (ref 3.5–5.1)
Sodium: 138 mEq/L (ref 135–145)
Total Bilirubin: 0.2 mg/dL — ABNORMAL LOW (ref 0.3–1.2)
Total Protein: 6.9 g/dL (ref 6.0–8.3)

## 2012-04-07 LAB — HEMOGLOBIN AND HEMATOCRIT, BLOOD
HCT: 32.1 % — ABNORMAL LOW (ref 36.0–46.0)
Hemoglobin: 10.5 g/dL — ABNORMAL LOW (ref 12.0–15.0)

## 2012-04-07 LAB — CBC
HCT: 33.3 % — ABNORMAL LOW (ref 36.0–46.0)
Hemoglobin: 10.8 g/dL — ABNORMAL LOW (ref 12.0–15.0)
RBC: 4.06 MIL/uL (ref 3.87–5.11)
WBC: 5.3 10*3/uL (ref 4.0–10.5)

## 2012-04-07 MED ORDER — PROPRANOLOL HCL ER 80 MG PO CP24
80.0000 mg | ORAL_CAPSULE | Freq: Every day | ORAL | Status: DC
  Administered 2012-04-07: 80 mg via ORAL
  Filled 2012-04-07 (×5): qty 1

## 2012-04-07 MED ORDER — MORPHINE SULFATE 2 MG/ML IJ SOLN
2.0000 mg | INTRAMUSCULAR | Status: DC | PRN
  Administered 2012-04-07 – 2012-04-08 (×3): 2 mg via INTRAVENOUS
  Filled 2012-04-07 (×3): qty 1

## 2012-04-07 MED ORDER — SODIUM CHLORIDE 0.9 % IV SOLN
INTRAVENOUS | Status: DC
  Administered 2012-04-07: 04:00:00 via INTRAVENOUS

## 2012-04-07 MED ORDER — SODIUM CHLORIDE 0.9 % IV SOLN
INTRAVENOUS | Status: AC
  Administered 2012-04-07: 06:00:00 via INTRAVENOUS

## 2012-04-07 MED ORDER — PANTOPRAZOLE SODIUM 40 MG PO TBEC
40.0000 mg | DELAYED_RELEASE_TABLET | Freq: Two times a day (BID) | ORAL | Status: DC
  Administered 2012-04-07 – 2012-04-08 (×2): 40 mg via ORAL
  Filled 2012-04-07 (×2): qty 1

## 2012-04-07 MED ORDER — PEG 3350-KCL-NABCB-NACL-NASULF 236 G PO SOLR
4000.0000 mL | Freq: Once | ORAL | Status: AC
  Administered 2012-04-07: 4000 mL via ORAL
  Filled 2012-04-07: qty 4000

## 2012-04-07 MED ORDER — MORPHINE SULFATE 4 MG/ML IJ SOLN
INTRAMUSCULAR | Status: AC
  Administered 2012-04-07: 2 mg
  Filled 2012-04-07: qty 1

## 2012-04-07 MED ORDER — TRAZODONE HCL 50 MG PO TABS
300.0000 mg | ORAL_TABLET | Freq: Every day | ORAL | Status: DC
  Administered 2012-04-07: 300 mg via ORAL
  Filled 2012-04-07: qty 6
  Filled 2012-04-07: qty 2

## 2012-04-07 MED ORDER — LISINOPRIL 10 MG PO TABS
20.0000 mg | ORAL_TABLET | Freq: Every day | ORAL | Status: DC
Start: 2012-04-07 — End: 2012-04-08
  Administered 2012-04-07: 20 mg via ORAL
  Filled 2012-04-07: qty 2

## 2012-04-07 MED ORDER — SIMVASTATIN 10 MG PO TABS
5.0000 mg | ORAL_TABLET | Freq: Every day | ORAL | Status: DC
  Administered 2012-04-07: 5 mg via ORAL
  Filled 2012-04-07: qty 1

## 2012-04-07 MED ORDER — MORPHINE SULFATE 4 MG/ML IJ SOLN
4.0000 mg | Freq: Once | INTRAMUSCULAR | Status: AC
  Administered 2012-04-07: 4 mg via INTRAVENOUS
  Filled 2012-04-07: qty 1

## 2012-04-07 MED ORDER — PANTOPRAZOLE SODIUM 40 MG PO TBEC: 40.0000 mg | DELAYED_RELEASE_TABLET | Freq: Every day | ORAL | Status: DC

## 2012-04-07 MED ORDER — ONDANSETRON HCL 4 MG/2ML IJ SOLN
4.0000 mg | Freq: Once | INTRAMUSCULAR | Status: AC
  Administered 2012-04-07: 4 mg via INTRAVENOUS
  Filled 2012-04-07: qty 2

## 2012-04-07 MED ORDER — DIVALPROEX SODIUM ER 500 MG PO TB24
1000.0000 mg | ORAL_TABLET | Freq: Every day | ORAL | Status: DC
  Administered 2012-04-07: 1000 mg via ORAL
  Filled 2012-04-07: qty 2

## 2012-04-07 MED ORDER — AMLODIPINE BESYLATE 5 MG PO TABS
10.0000 mg | ORAL_TABLET | Freq: Every day | ORAL | Status: DC
  Administered 2012-04-07: 10 mg via ORAL
  Filled 2012-04-07 (×2): qty 2

## 2012-04-07 NOTE — Progress Notes (Signed)
Report given to L. Basilia Jumbo, Charity fundraiser. Patient alert, and oriented and in stable condition at the time of transport.

## 2012-04-07 NOTE — Plan of Care (Signed)
Problem: Consults Goal: GI Bleeding Patient Education See Patient Education Module for education specifics. Outcome: Progressing Pt admitted with lower GI bleed. HGB=11.2 HCT=33.    Problem: Phase I Progression Outcomes Goal: Pain controlled with appropriate interventions Outcome: Progressing Pt given morphine 4 mg @ 0400 in ED for c/o left sided abdominal pain which reduced her pain from 8 to 6 on pain scale.

## 2012-04-07 NOTE — Progress Notes (Signed)
Patient admitted earlier today by Dr. Onalee Hua for GI bleeding  Patient seen and examined, database reviewed  Patient has not had any further bleeding since she has been in the hospital She is having mostly left sided abdominal pain GI has been consulted and is planning on EGD/colonoscopy in the AM Will continue to follow hemoglobin and provide supportive care Other medical issues are stable

## 2012-04-07 NOTE — H&P (Signed)
Chief Complaint:  brbpr  HPI: 51 yo female with several episodes of brbpr earlier tonight with some general abd crampiness in the lower abd, no diarrhea.  Usually constipated.  Has h/o internal hemorrhoids and this happeneing years ago had colonoscopy there and found hemorrhoids.  No fevers.  No n/v.    Review of Systems:  O/w neg  Past Medical History: Past Medical History  Diagnosis Date  . Hypertension   . Hyperlipemia   . GERD (gastroesophageal reflux disease)   . Bipolar 1 disorder    Past Surgical History  Procedure Date  . Cholecystectomy   . Appendectomy   . Abdominal hysterectomy     Medications: Prior to Admission medications   Medication Sig Start Date End Date Taking? Authorizing Provider  amLODipine (NORVASC) 10 MG tablet Take 10 mg by mouth at bedtime.     Historical Provider, MD  divalproex (DEPAKOTE ER) 500 MG 24 hr tablet Take 1,000 mg by mouth at bedtime.    Historical Provider, MD  lisinopril (PRINIVIL,ZESTRIL) 20 MG tablet Take 20 mg by mouth daily.    Historical Provider, MD  pantoprazole (PROTONIX) 40 MG tablet Take 40 mg by mouth daily.    Historical Provider, MD  pravastatin (PRAVACHOL) 40 MG tablet Take 40 mg by mouth daily.    Historical Provider, MD  promethazine (PHENERGAN) 25 MG tablet Take 0.5 tablets (12.5 mg total) by mouth every 6 (six) hours as needed for nausea. 01/31/12 02/07/12  Nicoletta Dress. Colon Branch, MD  propranolol (INDERAL) 80 MG tablet Take 80 mg by mouth daily.    Historical Provider, MD  traZODone (DESYREL) 150 MG tablet Take 300 mg by mouth at bedtime.    Historical Provider, MD    Allergies:   Allergies  Allergen Reactions  . Ibuprofen Other (See Comments)    GI upset  . Nitrofurantoin Monohyd Macro Itching    Social History:  reports that she has never smoked. She does not have any smokeless tobacco history on file. She reports that she does not drink alcohol or use illicit drugs.   Physical Exam: Filed Vitals:   04/07/12  0058 04/07/12 0059 04/07/12 0434  BP:  149/96 131/68  Pulse:  103 84  Temp:  97.7 F (36.5 C) 97.6 F (36.4 C)  TempSrc:  Oral Oral  Resp:  14 12  Height: 5\' 3"  (1.6 m)    Weight: 112.492 kg (248 lb)    SpO2:  100% 97%   General appearance: alert, cooperative and no distress Lungs: clear to auscultation bilaterally Heart: regular rate and rhythm, S1, S2 normal, no murmur, click, rub or gallop Abdomen: soft, non-tender; bowel sounds normal; no masses,  no organomegaly Extremities: extremities normal, atraumatic, no cyanosis or edema Pulses: 2+ and symmetric Skin: Skin color, texture, turgor normal. No rashes or lesions Neurologic: Grossly normal    Labs on Admission:   [facility] 04/07/12 0335 04/07/12 0250  NA 138 140  K 3.6 3.7  CL 98 101  CO2 31 --  GLUCOSE 112* 112*  BUN 7 6  CREATININE 0.86 1.00  CALCIUM 10.0 --  MG -- --  PHOS -- --    Basename 04/07/12 0335  AST 9  ALT 9  ALKPHOS 55  BILITOT 0.2*  PROT 6.9  ALBUMIN 3.4*    Basename 04/07/12 0335  LIPASE 23  AMYLASE --    Basename 04/07/12 0250 04/07/12 0156  WBC -- 5.3  NEUTROABS -- --  HGB 11.2* 10.8*  HCT 33.0* 33.3*  MCV -- 82.0  PLT -- 201   Radiological Exams on Admission: No results found.  Assessment/Plan Present on Admission:  51 yo female with hematechezia .BRBPR (bright red blood per rectum) .DISORDER, BIPOLAR NOS .HYPERTENSION .CORONARY ARTERY DISEASE  Qh/h 8 hrs.  Ivf.  hgb over 10.  Gi consult.   Dahl Higinbotham A [phone] 04/07/2012, 4:40 AM

## 2012-04-07 NOTE — Progress Notes (Signed)
UR Chart Review Completed  

## 2012-04-07 NOTE — ED Provider Notes (Signed)
History     CSN: [id number]  Arrival date & time 04/07/12  0043   First MD Initiated Contact with Patient 04/07/12 [phone]      Chief Complaint  Patient presents with  . Rectal Bleeding    (Consider location/radiation/quality/duration/timing/severity/associated sxs/prior treatment) HPI History provided by patient. Went to use the restroom tonight, urinated and noticed bright red blood in the toilet and rectal bleeding with wiping. No rectal pain. Some mild abdominal discomfort. some nausea no vomiting or diarrhea. No bowel movement. Has remote history of GI bleed and evaluated in [place] - cold she has internal hemorrhoids. Not had any history of GI bleeding since that time. No anticoagulants. No history of peptic ulcer disease. No NSAIDs. No syncope. Has felt dizzy and lightheaded since rectal bleeding. No shortness of breath.moderate in severity.no vaginal bleeding Past Medical History  Diagnosis Date  . Hypertension   . Hyperlipemia   . GERD (gastroesophageal reflux disease)   . Bipolar 1 disorder     Past Surgical History  Procedure Date  . Cholecystectomy   . Appendectomy   . Abdominal hysterectomy     No family history on file.  History  Substance Use Topics  . Smoking status: Never Smoker   . Smokeless tobacco: Not on file  . Alcohol Use: No    OB History    Grav Para Term Preterm Abortions TAB SAB Ect Mult Living                  Review of Systems  Constitutional: Negative for fever and chills.  HENT: Negative for neck pain and neck stiffness.   Eyes: Negative for pain.  Respiratory: Negative for shortness of breath.   Cardiovascular: Negative for chest pain.  Gastrointestinal: Positive for anal bleeding. Negative for abdominal pain.  Genitourinary: Negative for dysuria.  Musculoskeletal: Negative for back pain.  Skin: Negative for rash.  Neurological: Negative for headaches.  All other systems reviewed and are negative.    Allergies  Ibuprofen and  Nitrofurantoin monohyd macro  Home Medications  No current outpatient prescriptions on file.  BP 131/68  Pulse 84  Temp 97.6 F (36.4 C) (Oral)  Resp 12  Ht 5\' 3"  (1.6 m)  Wt 248 lb (112.492 kg)  BMI 43.93 kg/m2  SpO2 97%  Physical Exam  Constitutional: She is oriented to person, place, and time. She appears well-developed and well-nourished.  HENT:  Head: Normocephalic and atraumatic.  Eyes: Conjunctivae and EOM are normal. Pupils are equal, round, and reactive to light.  Neck: Trachea normal. Neck supple. No thyromegaly present.  Cardiovascular: Normal rate, regular rhythm, S1 normal, S2 normal and normal pulses.     No systolic murmur is present   No diastolic murmur is present  Pulses:      Radial pulses are 2+ on the right side, and 2+ on the left side.  Pulmonary/Chest: Effort normal and breath sounds normal. She has no wheezes. She has no rhonchi. She has no rales. She exhibits no tenderness.  Abdominal: Soft. Normal appearance and bowel sounds are normal. There is no tenderness. There is no rebound, no guarding, no CVA tenderness and negative Murphy's sign.  Genitourinary:       Rectal exam:Fingertip bright red blood. nontender without any external hemorrhoids or visualized source of bleeding  Musculoskeletal:       BLE:s Calves nontender, no cords or erythema, negative Homans sign  Neurological: She is alert and oriented to person, place, and time. She has normal strength.  No cranial nerve deficit or sensory deficit. GCS eye subscore is 4. GCS verbal subscore is 5. GCS motor subscore is 6.  Skin: Skin is warm and dry. No rash noted. She is not diaphoretic.  Psychiatric: Her speech is normal.       Cooperative and appropriate    ED Course  Procedures (including critical care time)  Results for orders placed during the hospital encounter of 04/07/12  CBC      Component Value Range   WBC 5.3  4.0 - 10.5 K/uL   RBC 4.06  3.87 - 5.11 MIL/uL   Hemoglobin 10.8 (*)  12.0 - 15.0 g/dL   HCT 45.4 (*) 09.8 - 11.9 %   MCV 82.0  78.0 - 100.0 fL   MCH 26.6  26.0 - 34.0 pg   MCHC 32.4  30.0 - 36.0 g/dL   RDW 14.7  82.9 - 56.2 %   Platelets 201  150 - 400 K/uL  COMPREHENSIVE METABOLIC PANEL      Component Value Range   Sodium 138  135 - 145 mEq/L   Potassium 3.6  3.5 - 5.1 mEq/L   Chloride 98  96 - 112 mEq/L   CO2 31  19 - 32 mEq/L   Glucose, Bld 112 (*) 70 - 99 mg/dL   BUN 7  6 - 23 mg/dL   Creatinine, Ser 1.30  0.50 - 1.10 mg/dL   Calcium 86.5  8.4 - 78.4 mg/dL   Total Protein 6.9  6.0 - 8.3 g/dL   Albumin 3.4 (*) 3.5 - 5.2 g/dL   AST 9  0 - 37 U/L   ALT 9  0 - 35 U/L   Alkaline Phosphatase 55  39 - 117 U/L   Total Bilirubin 0.2 (*) 0.3 - 1.2 mg/dL   GFR calc non Af Amer 78 (*) >90 mL/min   GFR calc Af Amer >90  >90 mL/min  LIPASE, BLOOD      Component Value Range   Lipase 23  11 - 59 U/L  POCT I-STAT, CHEM 8      Component Value Range   Sodium 140  135 - 145 mEq/L   Potassium 3.7  3.5 - 5.1 mEq/L   Chloride 101  96 - 112 mEq/L   BUN 6  6 - 23 mg/dL   Creatinine, Ser 6.96  0.50 - 1.10 mg/dL   Glucose, Bld 295 (*) 70 - 99 mg/dL   Calcium, Ion 2.84  1.32 - 1.23 mmol/L   TCO2 27  0 - 100 mmol/L   Hemoglobin 11.2 (*) 12.0 - 15.0 g/dL   HCT 44.0 (*) 10.2 - 72.5 %    IV fluids. Rectal bleeding by exam as above. Labs reviewed. Medicine consult for admission - tachycardia and dizziness in the setting of rectal bleeding.   1. GI bleed   2. Bipolar disorder, unspecified   3. BRBPR (bright red blood per rectum)   4. Other and unspecified hyperlipidemia   5. Unspecified essential hypertension    Case discussed as above with Dr. Onalee Hua who agrees to admission.  MDM   Nursing notes reviewed. Vital signs reviewed. Labs reviewed. Plan medical admission        Sunnie Nielsen, MD 04/07/12 [phone]

## 2012-04-07 NOTE — Consult Note (Signed)
PT REPORTS FAILING CONSCIOUS SEDATION WITH HER LAST TCS. ALSO C/O DYSPHAGIA.  NEEDS TCS/EGD W/ ? DILATION IN OR ON 7/24.

## 2012-04-07 NOTE — Consult Note (Signed)
Referring Provider: Dr Kerry Hough ([facility]) Primary Care Physician:  N/A Primary Gastroenterologist:  Dr. Jonette Eva   Chief Complaint  Patient presents with  . Rectal Bleeding    HPI:  [name] is a 51 y.o. female admitted with rectal bleeding.  C/o twisting abdominal pain & weakness yesterday.  11:30pm last night she went to [facility] to urinate & with wiping toilet paper & commode was full of blood bright red blood w/ clots in large amounts.  No previous recent bleeding.  BM once weekly.  Tried exlax prn & seems to help some.  No NSAIDS.  On stool softeners for constipation.  C/o heartburn for many years & takes protonix 40mg  daily.  She was having chest pains 2 mo ago.  She was advised to increase to BID, but she didn't have Rx for BID so she hasn't started.  Continues to have daily heartburn.  Denies dysphagia or odynophagia.  Appetite ok.  Denies palpitations or SOB.  C/o fatigue.  Denies dizziness.  Hx anemia with transfusion 2002 for menorrhagia s/p hysterectomy.  Hx chronic anemia & was on iron in past but stopped due to constipation.  Past Medical History  Diagnosis Date  . Hypertension   . Hyperlipemia   . GERD (gastroesophageal reflux disease)   . Bipolar 1 disorder     Past Surgical History  Procedure Date  . Cholecystectomy 10/2005  . Appendectomy 10/2005  . Abdominal hysterectomy 2001  . Right oophorectomy 1999  . Esophagogastroduodenoscopy 02/28/2006    [name], normal on daily PPI, myultiple hyperplastic polyps  . Colonoscopy 2007    Patel ([name])-pt reports hemorrhoids    Prior to Admission medications   Medication Sig Start Date End Date Taking? Authorizing Provider  amLODipine (NORVASC) 10 MG tablet Take 10 mg by mouth at bedtime.     Historical Provider, MD  divalproex (DEPAKOTE ER) 500 MG 24 hr tablet Take 1,000 mg by mouth at bedtime.    Historical Provider, MD  lisinopril (PRINIVIL,ZESTRIL) 20 MG tablet Take 20 mg by mouth daily.    Historical  Provider, MD  pantoprazole (PROTONIX) 40 MG tablet Take 40 mg by mouth daily.    Historical Provider, MD  pravastatin (PRAVACHOL) 40 MG tablet Take 40 mg by mouth daily.    Historical Provider, MD  promethazine (PHENERGAN) 25 MG tablet Take 0.5 tablets (12.5 mg total) by mouth every 6 (six) hours as needed for nausea. 01/31/12 02/07/12  Nicoletta Dress. Colon Branch, MD  propranolol (INDERAL) 80 MG tablet Take 80 mg by mouth daily.    Historical Provider, MD  traZODone (DESYREL) 150 MG tablet Take 300 mg by mouth at bedtime.    Historical Provider, MD    Current Facility-Administered Medications  Medication Dose Route Frequency Provider Last Rate Last Dose  . 0.9 %  sodium chloride infusion   Intravenous Continuous [place], MD 75 mL/hr at 04/07/12 0600    . amLODipine (NORVASC) tablet 10 mg  10 mg Oral QHS Tarry Kos, MD      . divalproex (DEPAKOTE ER) 24 hr tablet 1,000 mg  1,000 mg Oral QHS Tarry Kos, MD      . lisinopril (PRINIVIL,ZESTRIL) tablet 20 mg  20 mg Oral Daily Tarry Kos, MD      . morphine 4 MG/ML injection 4 mg  4 mg Intravenous Once Sunnie Nielsen, MD   4 mg at 04/07/12 0419  . ondansetron (ZOFRAN) injection 4 mg  4 mg Intravenous Once Sunnie Nielsen, MD  4 mg at 04/07/12 0419  . pantoprazole (PROTONIX) EC tablet 40 mg  40 mg Oral Daily Tarry Kos, MD      . propranolol ER (INDERAL LA) 24 hr capsule 80 mg  80 mg Oral Daily Tarry Kos, MD      . simvastatin (ZOCOR) tablet 5 mg  5 mg Oral q1800 Tarry Kos, MD      . traZODone (DESYREL) tablet 300 mg  300 mg Oral QHS Tarry Kos, MD      . DISCONTD: 0.9 %  sodium chloride infusion   Intravenous Continuous Sunnie Nielsen, MD 125 mL/hr at 04/07/12 0419      Allergies as of 04/07/2012 - Review Complete 04/07/2012  Allergen Reaction Noted  . Ibuprofen Other (See Comments) 10/21/2006  . Nitrofurantoin monohyd macro Itching 11/26/2011    Family History  Problem Relation Age of Onset  . Cirrhosis Mother 56    ?etiology  .  Cirrhosis Sister 30    ?etiology  . Diverticulitis Sister   . Cirrhosis Father     ?etiology    History   Social History  . Marital Status: Married    Spouse Name: N/A    Number of Children: 4  . Years of Education: N/A   Occupational History  . disabled    Social History Main Topics  . Smoking status: Never Smoker   . Smokeless tobacco: Not on file  . Alcohol Use: No  . Drug Use: No  . Sexually Active: Not on file   Other Topics Concern  . Not on file   Social History Narrative   Lives w/ husband, daughter, son-in-law & grandkids  Review of Systems: Gen: See HPI CV: Denies chest pain, angina, palpitations, syncope, orthopnea, PND, peripheral edema, and claudication. Resp: Denies dyspnea at rest, dyspnea with exercise, cough, sputum, wheezing, coughing up blood, and pleurisy. GI: Denies vomiting blood, jaundice, and fecal incontinence.   Denies dysphagia or odynophagia. GU : Denies urinary burning, blood in urine, urinary frequency, urinary hesitancy, nocturnal urination, and urinary incontinence. MS: Denies joint pain, limitation of movement, and swelling, stiffness, low back pain, extremity pain. Denies muscle weakness, cramps, atrophy.  Derm: Denies rash, itching, dry skin, hives, moles, warts, or unhealing ulcers.  Psych: Denies depression, anxiety, memory loss, suicidal ideation, hallucinations, paranoia, and confusion. Heme: Denies bruising, bleeding, and enlarged lymph nodes. Neuro:  Denies any headaches, dizziness, paresthesias. Endo:  Denies any problems with DM, thyroid, adrenal function.  Physical Exam: Vital signs in last 24 hours: Temp:  [97.6 F (36.4 C)-97.8 F (36.6 C)] 97.8 F (36.6 C) (07/23 0523) Pulse Rate:  [84-103] 84  (07/23 0523) Resp:  [12-18] 18  (07/23 0523) BP: (129-149)/(68-96) 129/87 mmHg (07/23 0523) SpO2:  [97 %-100 %] 97 % (07/23 0523) Weight:  [248 lb (112.492 kg)-253 lb 12 oz (115.1 kg)] 253 lb 12 oz (115.1 kg) (07/23 0523) Last  BM Date: 04/05/12 General:   Alert,  Well-developed, well-nourished, pleasant and cooperative in NAD.  Husband at bedside. Head:  Normocephalic and atraumatic. Eyes:  Sclera clear, no icterus.   Conjunctiva pink. Ears:  Normal auditory acuity. Nose:  No deformity, discharge, or lesions. Mouth:  Poor dentition.  Oropharynx pink & moist. Neck:  Supple; no masses or thyromegaly. Lungs:  Clear throughout to auscultation.   No wheezes, crackles, or rhonchi. No acute distress. Heart:  Regular rate and rhythm; no murmurs, clicks, rubs,  or gallops. Abdomen:  Normal bowel sounds.  No bruits.  Soft,moderate tenderness to entire  abdomen, non-distended without masses, hepatosplenomegaly or hernias noted.  No guarding or rebound tenderness.  Exam limited given body habitus.   Rectal:  Deferred. Msk:  Symmetrical without gross deformities Pulses:  Normal pulses noted. Extremities:  No clubbing or edema. Neurologic:  Alert and oriented x4;  grossly normal neurologically. Skin:  Intact without significant lesions or rashes. Lymph Nodes:  No significant cervical adenopathy. Psych:  Alert and cooperative. Normal mood and affect.  Intake/Output from previous day: 07/22 0701 - 07/23 0700 In: 15 [I.V.:15] Out: -  Intake/Output this shift:    Lab Results:  Basename 04/07/12 0551 04/07/12 0250 04/07/12 0156  WBC -- -- 5.3  HGB 10.5* 11.2* 10.8*  HCT 32.1* 33.0* 33.3*  PLT -- -- 201   BMET  Basename 04/07/12 0335 04/07/12 0250  NA 138 140  K 3.6 3.7  CL 98 101  CO2 31 --  GLUCOSE 112* 112*  BUN 7 6  CREATININE 0.86 1.00  CALCIUM 10.0 --   LFT  Basename 04/07/12 0335  PROT 6.9  ALBUMIN 3.4*  AST 9  ALT 9  ALKPHOS 55  BILITOT 0.2*  BILIDIR --  IBILI --  LIPASE 23  AMYLASE --   Impression: [name] is a pleasant 51 y.o. female with acute abdominal pain & hematochezia.  Differentials include ischemic colitis, inflammatory bowel disease, benign anorectal source, diverticular  bleeding or colorectal carcinoma.  I have discussed risks & benefits which include, but are not limited to, bleeding, infection, perforation & drug reaction.  The patient agrees with this plan & written consent will be obtained.    Hx GERD refractory to once daily PPI.  Pt also gives hx of chronic anemia.  Hgb is stable.  EGD at the same time as colonoscopy to look for PUD, gastritis, or less likely upper GI source of GI bleeding.  Plan: 1. Colonoscopy & EGD tomorrow w/ Dr Jena Gauss 2. Clear Liquids, NPO p MN 3. PPI Protonix 4. Follow H/H    LOS: 0 days   Lorenza Burton  04/07/2012, 8:58 AM [facility]

## 2012-04-07 NOTE — ED Notes (Signed)
Reports bleeding from rectum-states there was bright red blood in commode and on toilet paper after wiping; also c/o left sided abd pain; c/o nausea

## 2012-04-07 NOTE — ED Notes (Signed)
POCT occult blood stool positive.

## 2012-04-08 ENCOUNTER — Encounter (HOSPITAL_COMMUNITY)
Admission: EM | Discharge status: Against Medical Advice | Payer: Self-pay | Source: Home / Self Care | Attending: Internal Medicine

## 2012-04-08 ENCOUNTER — Encounter: Payer: Self-pay | Admitting: Internal Medicine

## 2012-04-08 DIAGNOSIS — F3289 Other specified depressive episodes: Secondary | ICD-10-CM

## 2012-04-08 DIAGNOSIS — K922 Gastrointestinal hemorrhage, unspecified: Secondary | ICD-10-CM

## 2012-04-08 DIAGNOSIS — F329 Major depressive disorder, single episode, unspecified: Secondary | ICD-10-CM

## 2012-04-08 DIAGNOSIS — I2510 Atherosclerotic heart disease of native coronary artery without angina pectoris: Secondary | ICD-10-CM

## 2012-04-08 LAB — HEMOGLOBIN AND HEMATOCRIT, BLOOD
HCT: 30.5 % — ABNORMAL LOW (ref 36.0–46.0)
HCT: 30.6 % — ABNORMAL LOW (ref 36.0–46.0)
Hemoglobin: 9.8 g/dL — ABNORMAL LOW (ref 12.0–15.0)
Hemoglobin: 9.8 g/dL — ABNORMAL LOW (ref 12.0–15.0)

## 2012-04-08 SURGERY — COLONOSCOPY WITH ESOPHAGOGASTRODUODENOSCOPY (EGD)
Anesthesia: Moderate Sedation

## 2012-04-08 SURGERY — COLONOSCOPY, ESOPHAGOGASTRODUODENOSCOPY (EGD) AND ESOPHAGEAL DILATION (ED)
Anesthesia: Moderate Sedation

## 2012-04-08 MED ORDER — PROMETHAZINE HCL 25 MG/ML IJ SOLN
25.0000 mg | INTRAMUSCULAR | Status: AC
  Administered 2012-04-08: 25 mg via INTRAVENOUS
  Filled 2012-04-08: qty 1

## 2012-04-08 MED ORDER — SODIUM CHLORIDE 0.9 % IJ SOLN
INTRAMUSCULAR | Status: AC
  Administered 2012-04-08: 10 mL
  Filled 2012-04-08: qty 6

## 2012-04-08 NOTE — Care Management Note (Unsigned)
    Page 1 of 1   04/08/2012     1:13:01 PM   CARE MANAGEMENT NOTE 04/08/2012  Patient:  [name], [name]   Account Number:  [id number]  Date Initiated:  04/08/2012  Documentation initiated by:  Rosemary Holms  Subjective/Objective Assessment:   Pt admitted from home where she lives with her spouse. Admitted with GI bleed. To have EGD and scope today.     Action/Plan:   Anticipate DC today or tomorrow. Spoke to pt and spouse, no HH needs identified at this time.   Anticipated DC Date:  04/09/2012   Anticipated DC Plan:  HOME/SELF CARE      DC Planning Services  CM consult      Choice offered to / List presented to:             Status of service:  In process, will continue to follow Medicare Important Message given?   (If response is "NO", the following Medicare IM given date fields will be blank) Date Medicare IM given:   Date Additional Medicare IM given:    Discharge Disposition:    Per UR Regulation:    If discussed at Long Length of Stay Meetings, dates discussed:    Comments:  04/08/12 1145 [employer] Cahoon RN BSN CM

## 2012-04-08 NOTE — Progress Notes (Signed)
Patient ID: [name], female   DOB: [birth year], 51 y.o.   MRN: [id number] Patient left the hospital today AGAINST MEDICAL ADVICE without getting her endoscopic evaluation.

## 2012-04-08 NOTE — Progress Notes (Signed)
[facility] PROGRESS NOTE  [name] ZOX:[id number] DOB: Feb 12, 1961 DOA: 04/07/2012 PCP: No primary provider on file.  Assessment/Plan: Principal Problem:  *BRBPR (bright red blood per rectum) Active Problems:  DISORDER, BIPOLAR NOS  HYPERTENSION  CORONARY ARTERY DISEASE  1. GI bleeding.  Patient to undergo EGD/colonoscopy today.  Will await further recommendations after procedure.  She is on protonix.  Her hemoglobin has remained stable.  2. HTN. Stable 3. Chronic anemia, can be followed in the outpatient setting. 4. Bipolar disorder, stable  Code Status: full code Family Communication: discussed with patient at bedside Disposition Plan: Possible discharge home later today or tomorrow pending results of GI studies today.   Brief narrative: This lady was admitted to the hospital with bright red blood per rectum.  She has associated lower abdominal pain.  Has history of internal hemorrhoids.  Consultants:  Gastroenterology   Procedures:  EGD/colonoscopy planned for today  Antibiotics:  none  HPI/Subjective: Abdominal pain is improving with pain medications.  She reports noticing some blood in her stool last night while moving her bowels.  No nausea or vomiting  Objective: Filed Vitals:   04/07/12 1213 04/07/12 1500 04/07/12 2125 04/08/12 0552  BP: 119/81 120/81 111/77 95/60  Pulse: 81 65 70 65  Temp:  97.9 F (36.6 C) 97.5 F (36.4 C) 98.4 F (36.9 C)  TempSrc:   Oral Oral  Resp: 20 20 20 18   Height:      Weight:      SpO2: 97% 97% 97% 90%    Intake/Output Summary (Last 24 hours) at 04/08/12 1015 Last data filed at 04/08/12 0800  Gross per 24 hour  Intake    360 ml  Output      1 ml  Net    359 ml    Exam:   General:  NAD  Cardiovascular: s1, s2, rrr  Respiratory: cta b  Abdomen: soft, nt, bs+  Data Reviewed: Basic Metabolic Panel:  Lab 04/07/12 8119 04/07/12 0250  NA 138 140  K 3.6 3.7  CL 98 101  CO2 31 --  GLUCOSE 112* 112*    BUN 7 6  CREATININE 0.86 1.00  CALCIUM 10.0 --  MG -- --  PHOS -- --   Liver Function Tests:  Lab 04/07/12 0335  AST 9  ALT 9  ALKPHOS 55  BILITOT 0.2*  PROT 6.9  ALBUMIN 3.4*    Lab 04/07/12 0335  LIPASE 23  AMYLASE --   No results found for this basename: AMMONIA:5 in the last 168 hours CBC:  Lab 04/08/12 0610 04/07/12 2209 04/07/12 1329 04/07/12 0551 04/07/12 0250 04/07/12 0156  WBC -- -- -- -- -- 5.3  NEUTROABS -- -- -- -- -- --  HGB 9.8* 10.3* 9.9* 10.5* 11.2* --  HCT 30.5* 32.1* 30.6* 32.1* 33.0* --  MCV -- -- -- -- -- 82.0  PLT -- -- -- -- -- 201   Cardiac Enzymes: No results found for this basename: CKTOTAL:5,CKMB:5,CKMBINDEX:5,TROPONINI:5 in the last 168 hours BNP (last 3 results) No results found for this basename: PROBNP:3 in the last 8760 hours CBG: No results found for this basename: GLUCAP:5 in the last 168 hours  No results found for this or any previous visit (from the past 240 hour(s)).   Studies: No results found.  Scheduled Meds:   . amLODipine  10 mg Oral QHS  . divalproex  1,000 mg Oral QHS  . lisinopril  20 mg Oral Daily  . morphine      .  pantoprazole  40 mg Oral BID AC  . polyethylene glycol  4,000 mL Oral Once  . promethazine  25 mg Intravenous On Call  . propranolol ER  80 mg Oral Daily  . simvastatin  5 mg Oral q1800  . sodium chloride      . traZODone  300 mg Oral QHS   Continuous Infusions:   . sodium chloride 50 mL/hr at 04/07/12 4696    Principal Problem:  *BRBPR (bright red blood per rectum) Active Problems:  DISORDER, BIPOLAR NOS  HYPERTENSION  CORONARY ARTERY DISEASE    Time spent:    MEMON,JEHANZEB  [facility] Pager [phone]. If 7PM-7AM, please contact night-coverage at [email or web address], password [facility] 04/08/2012, 10:15 AM  LOS: 1 day

## 2012-04-08 NOTE — Discharge Summary (Signed)
Physician Discharge Summary  [name] ZOX:[id number] DOB: Jan 26, 1961 DOA: 04/07/2012  PCP: No primary provider on file.  Admit date: 04/07/2012 Discharge date: 04/08/2012  Patient left the hospital AGAINST MEDICAL ADVICE, without having an endoscopic evaluation   Discharge Diagnoses:  Principal Problem:  *BRBPR (bright red blood per rectum) Active Problems:  DISORDER, BIPOLAR NOS  HYPERTENSION  CORONARY ARTERY DISEASE   History of present illness:  51 yo female with several episodes of brbpr earlier tonight with some general abd crampiness in the lower abd, no diarrhea. Usually constipated. Has h/o internal hemorrhoids and this happeneing years ago had colonoscopy there and found hemorrhoids. No fevers. No n/v.   Hospital Course:  This lady was admitted to the hospital with abdominal cramping and blood per rectum. She was monitored in the hospital have serial hemoglobin drawn. Her hemoglobin remained relatively stable and she did not require blood transfusions. She was evaluated by the gastroenterology service and felt that she would require EGD/colonoscopy. Patient underwent preparation for the colonoscopy. On the day of the scheduled procedure, patient did not want to wait any longer to have her procedure done. She left AGAINST MEDICAL ADVICE. The undersigned physician was made aware of this after she had already left. Her medications were not reconciled.  Procedures:  None  Consultations:  Gastroenterology  Discharge Exam: Filed Vitals:   04/08/12 1343  BP: 112/74  Pulse: 62  Temp: 98.3 F (36.8 C)  Resp: 20   Filed Vitals:   04/07/12 1500 04/07/12 2125 04/08/12 0552 04/08/12 1343  BP: 120/81 111/77 95/60 112/74  Pulse: 65 70 65 62  Temp: 97.9 F (36.6 C) 97.5 F (36.4 C) 98.4 F (36.9 C) 98.3 F (36.8 C)  TempSrc:  Oral Oral   Resp: 20 20 18 20   Height:      Weight:      SpO2: 97% 97% 90%      The results of significant diagnostics from this  hospitalization (including imaging, microbiology, ancillary and laboratory) are listed below for reference.    Significant Diagnostic Studies: No results found.  Microbiology: No results found for this or any previous visit (from the past 240 hour(s)).   Labs: Basic Metabolic Panel:  Lab 04/07/12 8119 04/07/12 0250  NA 138 140  K 3.6 3.7  CL 98 101  CO2 31 --  GLUCOSE 112* 112*  BUN 7 6  CREATININE 0.86 1.00  CALCIUM 10.0 --  MG -- --  PHOS -- --   Liver Function Tests:  Lab 04/07/12 0335  AST 9  ALT 9  ALKPHOS 55  BILITOT 0.2*  PROT 6.9  ALBUMIN 3.4*    Lab 04/07/12 0335  LIPASE 23  AMYLASE --   No results found for this basename: AMMONIA:5 in the last 168 hours CBC:  Lab 04/08/12 1357 04/08/12 0610 04/07/12 2209 04/07/12 1329 04/07/12 0551 04/07/12 0156  WBC -- -- -- -- -- 5.3  NEUTROABS -- -- -- -- -- --  HGB 9.8* 9.8* 10.3* 9.9* 10.5* --  HCT 30.6* 30.5* 32.1* 30.6* 32.1* --  MCV -- -- -- -- -- 82.0  PLT -- -- -- -- -- 201   Cardiac Enzymes: No results found for this basename: CKTOTAL:5,CKMB:5,CKMBINDEX:5,TROPONINI:5 in the last 168 hours BNP: BNP (last 3 results) No results found for this basename: PROBNP:3 in the last 8760 hours CBG: No results found for this basename: GLUCAP:5 in the last 168 hours  Time coordinating discharge:  Signed:  Jeany Seville  [facility] 04/08/2012,  8:36 PM

## 2012-04-08 NOTE — Progress Notes (Signed)
Patient stated she was not willing to wait for her scheduled procedures and wanted to just "sign out" and leave.  Patient was informed that she would have to sign out AMA and would have to release the hospital and staff from all responsibility if leaving AMA.  Patient also informed that she would have to repeat all prep work to have her procedure done on an outpatient basis.  She acknowledged that she understood all information given.  IV removed intact and bandaged with no active bleeding.  AMA paper signed, and patient ambulated off the floor.

## 2012-05-06 NOTE — Progress Notes (Signed)
Pt left AMA. SEND LETTER TO DISCHARGE FROM THE PRACTICE.

## 2012-05-07 ENCOUNTER — Encounter: Payer: Self-pay | Admitting: General Practice

## 2012-05-07 NOTE — Progress Notes (Signed)
D/c letter mailed.

## 2012-06-02 NOTE — Progress Notes (Signed)
pts discharge letter was returned to the post office- unclaimed. Made copies of envelope and sent to be scanned, original letter was re-mailed to pt via regular mail.

## 2012-06-30 NOTE — Progress Notes (Signed)
2nd attempt to mail discharge letter was returned- unable to forward- envelope sent to be scanned into epic.

## 2013-12-01 IMAGING — CR DG CHEST 2V
2 series · 2 of 2 positions shown · non-contrast
Comparison: 02/17/2011

CLINICAL DATA: Nausea

CHEST - 2 VIEW

[view not recorded (1 of 2)]
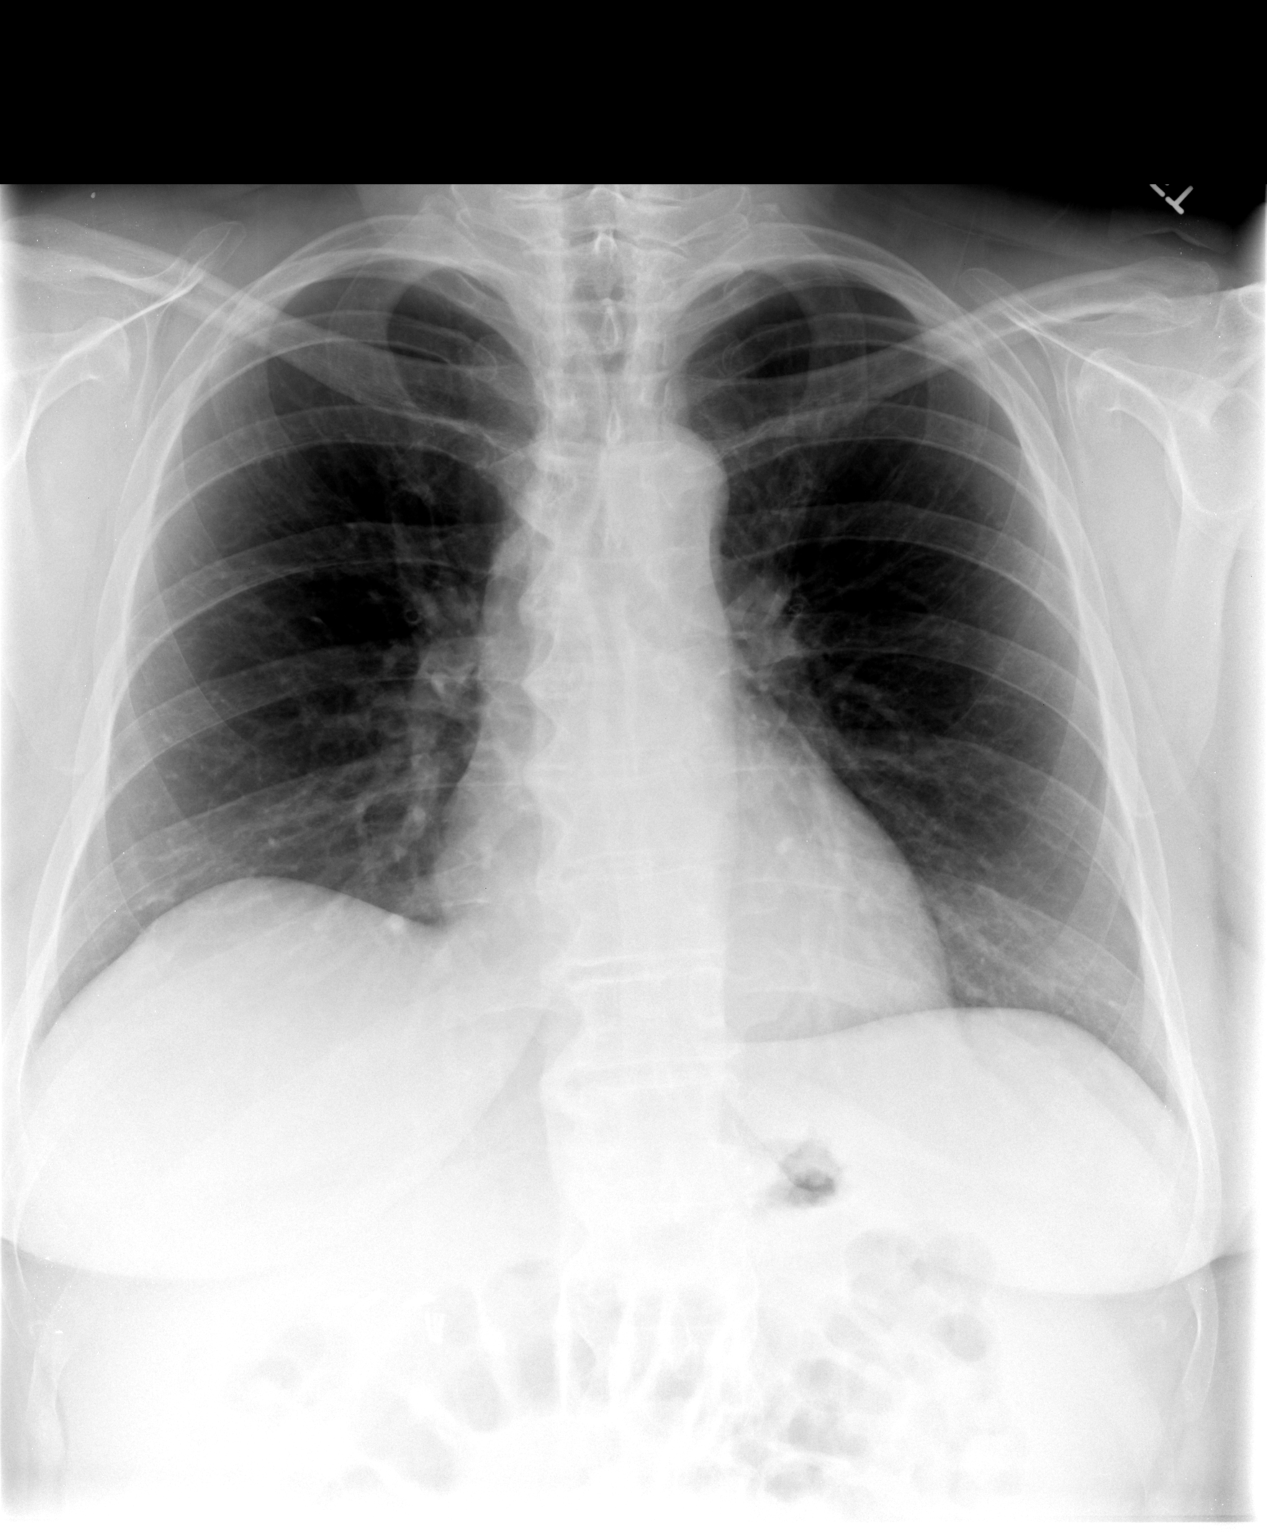

[view not recorded (2 of 2)]
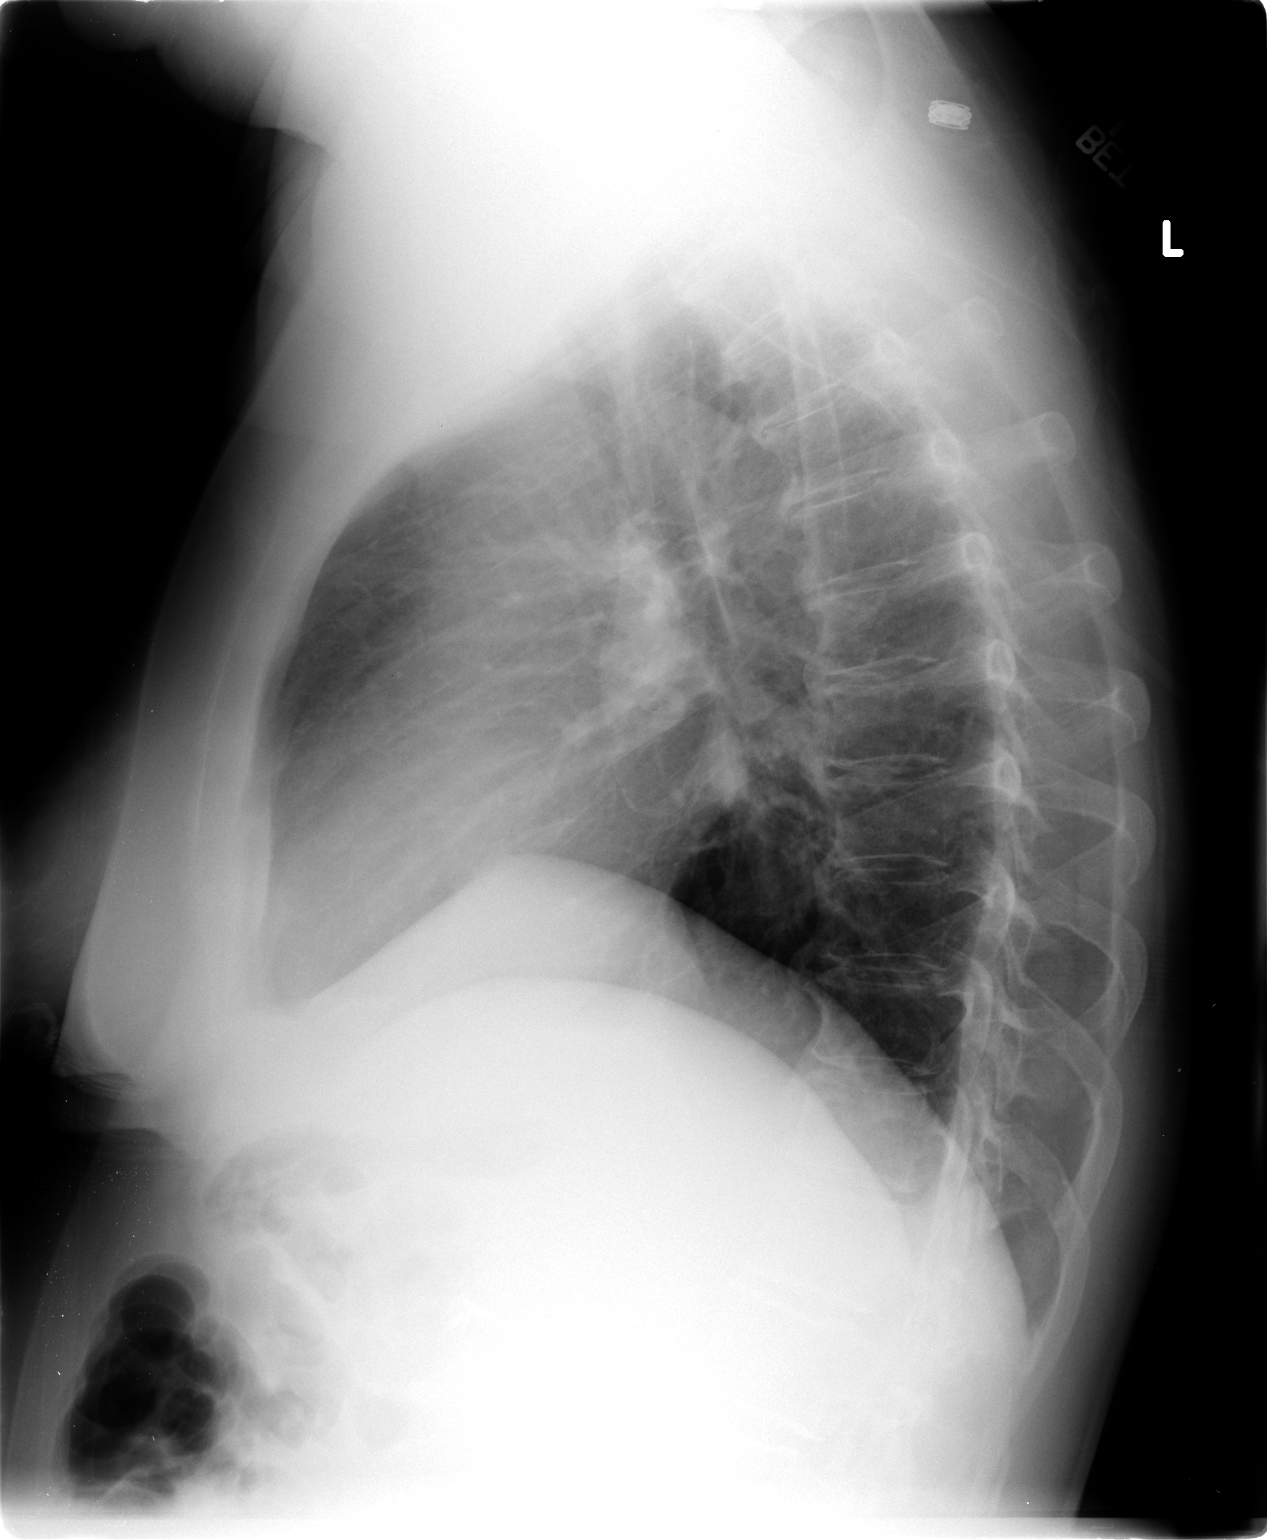

[2 of 2 positions shown; findings below may reference images not displayed]

FINDINGS: Normal heart size.  Clear lungs.  No pneumothorax or
pleural effusion.
IMPRESSION: No active cardiopulmonary disease.

## 2014-02-14 DIAGNOSIS — IMO0001 Reserved for inherently not codable concepts without codable children: Secondary | ICD-10-CM

## 2014-02-14 HISTORY — DX: Reserved for inherently not codable concepts without codable children: IMO0001

## 2015-05-20 ENCOUNTER — Emergency Department (HOSPITAL_COMMUNITY): Payer: Medicaid - Out of State

## 2015-05-20 ENCOUNTER — Encounter (HOSPITAL_COMMUNITY): Payer: Self-pay

## 2015-05-20 ENCOUNTER — Emergency Department (HOSPITAL_COMMUNITY)
Admission: EM | Admit: 2015-05-20 | Discharge: 2015-05-21 | Disposition: A | Discharge status: Home / Self Care | Payer: Medicaid - Out of State | Attending: Emergency Medicine | Admitting: Emergency Medicine

## 2015-05-20 DIAGNOSIS — G8929 Other chronic pain: Secondary | ICD-10-CM | POA: Insufficient documentation

## 2015-05-20 DIAGNOSIS — E119 Type 2 diabetes mellitus without complications: Secondary | ICD-10-CM | POA: Diagnosis not present

## 2015-05-20 DIAGNOSIS — E785 Hyperlipidemia, unspecified: Secondary | ICD-10-CM | POA: Diagnosis not present

## 2015-05-20 DIAGNOSIS — I10 Essential (primary) hypertension: Secondary | ICD-10-CM | POA: Insufficient documentation

## 2015-05-20 DIAGNOSIS — K219 Gastro-esophageal reflux disease without esophagitis: Secondary | ICD-10-CM | POA: Diagnosis not present

## 2015-05-20 DIAGNOSIS — R0602 Shortness of breath: Secondary | ICD-10-CM | POA: Diagnosis not present

## 2015-05-20 DIAGNOSIS — F319 Bipolar disorder, unspecified: Secondary | ICD-10-CM | POA: Insufficient documentation

## 2015-05-20 DIAGNOSIS — I4891 Unspecified atrial fibrillation: Secondary | ICD-10-CM | POA: Insufficient documentation

## 2015-05-20 DIAGNOSIS — Z7901 Long term (current) use of anticoagulants: Secondary | ICD-10-CM | POA: Diagnosis not present

## 2015-05-20 DIAGNOSIS — R079 Chest pain, unspecified: Secondary | ICD-10-CM | POA: Insufficient documentation

## 2015-05-20 DIAGNOSIS — Z794 Long term (current) use of insulin: Secondary | ICD-10-CM | POA: Insufficient documentation

## 2015-05-20 DIAGNOSIS — Z79899 Other long term (current) drug therapy: Secondary | ICD-10-CM | POA: Diagnosis not present

## 2015-05-20 DIAGNOSIS — Z7951 Long term (current) use of inhaled steroids: Secondary | ICD-10-CM | POA: Diagnosis not present

## 2015-05-20 HISTORY — DX: Chest pain, unspecified: R07.9

## 2015-05-20 HISTORY — DX: Unspecified asthma, uncomplicated: J45.909

## 2015-05-20 HISTORY — DX: Irritable bowel syndrome, unspecified: K58.9

## 2015-05-20 HISTORY — DX: Type 2 diabetes mellitus without complications: E11.9

## 2015-05-20 HISTORY — DX: Unspecified atrial fibrillation: I48.91

## 2015-05-20 HISTORY — DX: Reserved for inherently not codable concepts without codable children: IMO0001

## 2015-05-20 HISTORY — DX: Gastroparesis: K31.84

## 2015-05-20 HISTORY — DX: Other chronic pain: G89.29

## 2015-05-20 HISTORY — DX: Dorsalgia, unspecified: M54.9

## 2015-05-20 LAB — BASIC METABOLIC PANEL
ANION GAP: 9 (ref 5–15)
BUN: 20 mg/dL (ref 6–20)
CHLORIDE: 100 mmol/L — AB (ref 101–111)
CO2: 26 mmol/L (ref 22–32)
Calcium: 8.9 mg/dL (ref 8.9–10.3)
Creatinine, Ser: 1.44 mg/dL — ABNORMAL HIGH (ref 0.44–1.00)
GFR calc Af Amer: 47 mL/min — ABNORMAL LOW (ref >60)
GFR calc non Af Amer: 41 mL/min — ABNORMAL LOW (ref >60)
GLUCOSE: 189 mg/dL — AB (ref 65–99)
POTASSIUM: 3.5 mmol/L (ref 3.5–5.1)
Sodium: 135 mmol/L (ref 135–145)

## 2015-05-20 LAB — CBC WITH DIFFERENTIAL/PLATELET
Basophils Absolute: 0 10*3/uL (ref 0.0–0.1)
Basophils Relative: 0 % (ref 0–1)
Eosinophils Absolute: 0.2 10*3/uL (ref 0.0–0.7)
Eosinophils Relative: 2 % (ref 0–5)
HCT: 37.1 % (ref 36.0–46.0)
Hemoglobin: 12.3 g/dL (ref 12.0–15.0)
Lymphocytes Relative: 31 % (ref 12–46)
Lymphs Abs: 2.6 10*3/uL (ref 0.7–4.0)
MCH: 28.3 pg (ref 26.0–34.0)
MCHC: 33.2 g/dL (ref 30.0–36.0)
MCV: 85.3 fL (ref 78.0–100.0)
Monocytes Absolute: 0.5 10*3/uL (ref 0.1–1.0)
Monocytes Relative: 6 % (ref 3–12)
Neutro Abs: 5 10*3/uL (ref 1.7–7.7)
Neutrophils Relative %: 61 % (ref 43–77)
Platelets: 226 10*3/uL (ref 150–400)
RBC: 4.35 MIL/uL (ref 3.87–5.11)
RDW: 15.1 % (ref 11.5–15.5)
WBC: 8.3 10*3/uL (ref 4.0–10.5)

## 2015-05-20 LAB — TROPONIN I: Troponin I: <0.03 ng/mL (ref <0.031)

## 2015-05-20 LAB — D-DIMER, QUANTITATIVE: D-Dimer, Quant: 1.96 ug/mL-FEU — ABNORMAL HIGH (ref 0.00–0.48)

## 2015-05-20 MED ORDER — TECHNETIUM TO 99M ALBUMIN AGGREGATED
6.0000 | Freq: Once | INTRAVENOUS | Status: AC | PRN
  Administered 2015-05-20: 6.5 via INTRAVENOUS

## 2015-05-20 MED ORDER — TECHNETIUM TC 99M DIETHYLENETRIAME-PENTAACETIC ACID
40.0000 | Freq: Once | INTRAVENOUS | Status: DC | PRN
  Administered 2015-05-20: 42 via INTRAVENOUS
  Filled 2015-05-20: qty 40

## 2015-05-20 MED ORDER — ONDANSETRON 4 MG PO TBDP
4.0000 mg | ORAL_TABLET | Freq: Once | ORAL | Status: AC
  Administered 2015-05-20: 4 mg via ORAL
  Filled 2015-05-20: qty 1

## 2015-05-20 NOTE — ED Notes (Signed)
Chest pain started 1hour PTA. Patient states she has been driving from [place] for the past two days.

## 2015-05-20 NOTE — ED Notes (Signed)
Pt is talking on the cell phone with daughter in room.

## 2015-05-20 NOTE — ED Provider Notes (Signed)
CSN: [id number]     Arrival date & time 05/20/15  1905 History   First MD Initiated Contact with Patient 05/20/15 1924     Chief Complaint  Patient presents with  . Chest Pain     (Consider location/radiation/quality/duration/timing/severity/associated sxs/prior Treatment) Patient is a 54 y.o. female presenting with chest pain. The history is provided by the patient.  Chest Pain Associated symptoms: shortness of breath   Associated symptoms: no abdominal pain, no back pain, no diaphoresis, no fever, no headache, no nausea, no numbness, not vomiting and no weakness    patient presents with dull pain in her mid chest. States that began while she was driving back from [facility]. States she does have some chronic chest pain. States she has had a blood clot in her left arm in the past. States she's had a negative heart catheter last couple years. States she does feel little shortness of breath. No swelling or legs. No fevers. No cough. Reported history of atrial fibrillation. Patient states she was taken off her blood thinners when she had to have all of her teeth taken out because of mouth infections spread.  Past Medical History  Diagnosis Date  . Hypertension   . Hyperlipemia   . GERD (gastroesophageal reflux disease)   . Bipolar 1 disorder   . Diabetes mellitus without complication   . Atrial fibrillation   . Chronic back pain   . Gastroparesis   . IBS (irritable bowel syndrome)   . Chronic chest pain    Past Surgical History  Procedure Laterality Date  . Cholecystectomy  10/2005  . Appendectomy  10/2005  . Abdominal hysterectomy  2001  . Right oophorectomy  1999  . Esophagogastroduodenoscopy  02/28/2006    [name], normal on daily PPI, myultiple hyperplastic polyps  . Colonoscopy  2007    Patel ([name])-pt reports hemorrhoids  . Mouth surgery     Family History  Problem Relation Age of Onset  . Cirrhosis Mother 70    ?etiology  . Cirrhosis Sister 73    ?etiology  .  Diverticulitis Sister   . Cirrhosis Father     ?etiology   Social History  Substance Use Topics  . Smoking status: Passive Smoke Exposure - Never Smoker  . Smokeless tobacco: None  . Alcohol Use: No   OB History    No data available     Review of Systems  Constitutional: Negative for fever, diaphoresis, activity change and appetite change.  Eyes: Negative for pain.  Respiratory: Positive for shortness of breath. Negative for chest tightness.   Cardiovascular: Positive for chest pain. Negative for leg swelling.  Gastrointestinal: Negative for nausea, vomiting, abdominal pain and diarrhea.  Genitourinary: Negative for flank pain.  Musculoskeletal: Negative for back pain and neck stiffness.  Skin: Negative for rash.  Neurological: Negative for weakness, numbness and headaches.  Psychiatric/Behavioral: Negative for behavioral problems.      Allergies  Contrast media; Ibuprofen; Metformin and related; Nitroglycerin; Sulfa antibiotics; Bentyl; and Nitrofurantoin monohyd macro  Home Medications   Prior to Admission medications   Medication Sig Start Date End Date Taking? Authorizing Provider  albuterol (PROAIR HFA) 108 (90 BASE) MCG/ACT inhaler Inhale 1-2 puffs into the lungs every 6 (six) hours as needed for wheezing or shortness of breath.   Yes Historical Provider, MD  albuterol (PROVENTIL) (2.5 MG/3ML) 0.083% nebulizer solution USE ONE VIAL IN NEBULIZER EVERY 6 HOURS AS NEEDED FOR SHORTNESS OF BREATH AND/OR WHEEZING   Yes Historical Provider, MD  amLODipine (NORVASC) 10 MG tablet Take 10 mg by mouth at bedtime.    Yes Historical Provider, MD  carvedilol (COREG) 25 MG tablet Take 25 mg by mouth 2 (two) times daily.   Yes Historical Provider, MD  clonazePAM (KLONOPIN) 2 MG tablet Take 2 mg by mouth 3 (three) times daily as needed for anxiety.   Yes Historical Provider, MD  cyclobenzaprine (FLEXERIL) 10 MG tablet Take 10 mg by mouth 3 (three) times daily as needed for muscle  spasms.   Yes Historical Provider, MD  divalproex (DEPAKOTE ER) 500 MG 24 hr tablet Take 1,000 mg by mouth at bedtime.   Yes Historical Provider, MD  docusate sodium (COLACE) 100 MG capsule Take 100 mg by mouth 2 (two) times daily.   Yes Historical Provider, MD  ezetimibe (ZETIA) 10 MG tablet Take 10 mg by mouth daily.   Yes Historical Provider, MD  hydrochlorothiazide (HYDRODIURIL) 25 MG tablet Take 25 mg by mouth daily.   Yes Historical Provider, MD  HYDROcodone-acetaminophen (NORCO) 10-325 MG per tablet Take 1 tablet by mouth 3 (three) times daily.  05/08/15  Yes Historical Provider, MD  insulin detemir (LEVEMIR) 100 UNIT/ML injection Inject 34 Units into the skin at bedtime.    Yes Historical Provider, MD  lisinopril (PRINIVIL,ZESTRIL) 20 MG tablet Take 40 mg by mouth daily.    Yes Historical Provider, MD  NEXIUM 40 MG capsule Take 40 mg by mouth daily. 05/08/15  Yes Historical Provider, MD  NOVOLOG FLEXPEN 100 UNIT/ML FlexPen INJECT 5 UNITS THREE TIMES DAILY BASED ON SLIDING SCALE INSTRUCTIONS 05/13/15  Yes Historical Provider, MD  pravastatin (PRAVACHOL) 40 MG tablet Take 40 mg by mouth daily.   Yes Historical Provider, MD  QVAR 40 MCG/ACT inhaler Inhale 1 puff into the lungs 2 (two) times daily. 05/13/15  Yes Historical Provider, MD  traZODone (DESYREL) 150 MG tablet Take 300 mg by mouth at bedtime.   Yes Historical Provider, MD  apixaban (ELIQUIS) 5 MG TABS tablet Take 5 mg by mouth 2 (two) times daily.    Historical Provider, MD   BP 146/103 mmHg  Pulse 82  Temp(Src) 97.9 F (36.6 C) (Oral)  Resp 20  Ht 5\' 3"  (1.6 m)  Wt 260 lb (117.935 kg)  BMI 46.07 kg/m2  SpO2 100% Physical Exam  Constitutional: She appears well-developed.  HENT:  Head: Atraumatic.  Cardiovascular: Normal rate and regular rhythm.   Pulmonary/Chest: Effort normal.  Abdominal: Soft. There is no tenderness.  Musculoskeletal: Normal range of motion. She exhibits no edema.  Neurological: She is alert.  Skin: Skin  is warm.    ED Course  Procedures (including critical care time) Labs Review Labs Reviewed  D-DIMER, QUANTITATIVE (NOT AT [facility]) - Abnormal; Notable for the following:    D-Dimer, Quant 1.96 (*)    All other components within normal limits  BASIC METABOLIC PANEL - Abnormal; Notable for the following:    Chloride 100 (*)    Glucose, Bld 189 (*)    Creatinine, Ser 1.44 (*)    GFR calc non Af Amer 41 (*)    GFR calc Af Amer 47 (*)    All other components within normal limits  TROPONIN I  CBC WITH DIFFERENTIAL/PLATELET    Imaging Review Dg Chest 2 View  05/20/2015   CLINICAL DATA:  Chest pain.  Nausea.  Recent long car ride  EXAM: CHEST  2 VIEW  COMPARISON:  12/24/2011  FINDINGS: The heart size and mediastinal contours are within normal limits.  Both lungs are clear. The visualized skeletal structures are unremarkable.  IMPRESSION: No active cardiopulmonary disease.   Electronically Signed   By: Andreas Newport M.D.   On: 05/20/2015 21:08   I have personally reviewed and evaluated these images and lab results as part of my medical decision-making.   EKG Interpretation   Date/Time:  Saturday May 20 2015 19:14:38 EDT Ventricular Rate:  86 PR Interval:  139 QRS Duration: 100 QT Interval:  389 QTC Calculation: 465 R Axis:   53 Text Interpretation:  Sinus rhythm Borderline repolarization abnormality  Confirmed by Alvino Chapel  MD, Ovid Curd [phone]) on 05/20/2015 8:25:59 PM      MDM   Final diagnoses:  SOB (shortness of breath)  Chronic chest pain    Patient presents with chest pain and shortness of breath. Patient rode from [place] in the car over the last 2 days. Records review this and has frequent visits for chest pain to the ER there. Sounds like she has had a recent negative heart cath. Found to have an elevated d-dimer here. Will get VQ scan since she has a reported contrast allergy. She's had 4 previous CAT scans here with out incident, but reportedly the last 2 CAT scans  she had had one with infiltration and then one had amount of throat tightening. Patient came also with 2 other family members to the ER and with outside records reviewed had been to various other ERs with them in [place] 3 times in the last month.      Davonna Belling, MD 05/20/15 2123

## 2015-05-21 NOTE — ED Notes (Signed)
Pt up to restroom with no assistance. 

## 2015-05-21 NOTE — ED Provider Notes (Signed)
Pt well appearing She is walking around in in the ED and no distress Imaging negative Stable for d/c home   Ripley Fraise, MD 05/21/15 [phone]

## 2015-05-21 NOTE — Discharge Instructions (Signed)

## 2015-08-01 ENCOUNTER — Emergency Department (HOSPITAL_COMMUNITY): Payer: Medicaid - Out of State

## 2015-08-01 ENCOUNTER — Emergency Department (HOSPITAL_COMMUNITY)
Admission: EM | Admit: 2015-08-01 | Discharge: 2015-08-01 | Discharge status: Against Medical Advice | Payer: Medicaid - Out of State | Attending: Emergency Medicine | Admitting: Emergency Medicine

## 2015-08-01 ENCOUNTER — Encounter (HOSPITAL_COMMUNITY): Payer: Self-pay | Admitting: Emergency Medicine

## 2015-08-01 DIAGNOSIS — Z79899 Other long term (current) drug therapy: Secondary | ICD-10-CM | POA: Diagnosis not present

## 2015-08-01 DIAGNOSIS — I10 Essential (primary) hypertension: Secondary | ICD-10-CM | POA: Insufficient documentation

## 2015-08-01 DIAGNOSIS — F319 Bipolar disorder, unspecified: Secondary | ICD-10-CM | POA: Insufficient documentation

## 2015-08-01 DIAGNOSIS — Z9049 Acquired absence of other specified parts of digestive tract: Secondary | ICD-10-CM | POA: Insufficient documentation

## 2015-08-01 DIAGNOSIS — R079 Chest pain, unspecified: Secondary | ICD-10-CM | POA: Insufficient documentation

## 2015-08-01 DIAGNOSIS — I4891 Unspecified atrial fibrillation: Secondary | ICD-10-CM | POA: Diagnosis not present

## 2015-08-01 DIAGNOSIS — G8929 Other chronic pain: Secondary | ICD-10-CM | POA: Insufficient documentation

## 2015-08-01 DIAGNOSIS — E785 Hyperlipidemia, unspecified: Secondary | ICD-10-CM | POA: Diagnosis not present

## 2015-08-01 DIAGNOSIS — R300 Dysuria: Secondary | ICD-10-CM | POA: Insufficient documentation

## 2015-08-01 DIAGNOSIS — Z90710 Acquired absence of both cervix and uterus: Secondary | ICD-10-CM | POA: Diagnosis not present

## 2015-08-01 DIAGNOSIS — Z8719 Personal history of other diseases of the digestive system: Secondary | ICD-10-CM | POA: Insufficient documentation

## 2015-08-01 DIAGNOSIS — M545 Low back pain: Secondary | ICD-10-CM | POA: Diagnosis not present

## 2015-08-01 DIAGNOSIS — Z794 Long term (current) use of insulin: Secondary | ICD-10-CM | POA: Insufficient documentation

## 2015-08-01 DIAGNOSIS — R109 Unspecified abdominal pain: Secondary | ICD-10-CM | POA: Insufficient documentation

## 2015-08-01 DIAGNOSIS — E119 Type 2 diabetes mellitus without complications: Secondary | ICD-10-CM | POA: Diagnosis not present

## 2015-08-01 DIAGNOSIS — J45909 Unspecified asthma, uncomplicated: Secondary | ICD-10-CM | POA: Diagnosis not present

## 2015-08-01 DIAGNOSIS — Z7901 Long term (current) use of anticoagulants: Secondary | ICD-10-CM | POA: Diagnosis not present

## 2015-08-01 LAB — URINALYSIS, ROUTINE W REFLEX MICROSCOPIC
Bilirubin Urine: NEGATIVE
Glucose, UA: 250 mg/dL — AB
Hgb urine dipstick: NEGATIVE
KETONES UR: NEGATIVE mg/dL
LEUKOCYTES UA: NEGATIVE
NITRITE: NEGATIVE
PH: 5 (ref 5.0–8.0)
PROTEIN: NEGATIVE mg/dL
Specific Gravity, Urine: 1.025 (ref 1.005–1.030)

## 2015-08-01 LAB — BASIC METABOLIC PANEL
ANION GAP: 8 (ref 5–15)
BUN: 11 mg/dL (ref 6–20)
CO2: 27 mmol/L (ref 22–32)
Calcium: 8.8 mg/dL — ABNORMAL LOW (ref 8.9–10.3)
Chloride: 103 mmol/L (ref 101–111)
Creatinine, Ser: 0.95 mg/dL (ref 0.44–1.00)
GFR calc Af Amer: >60 mL/min (ref >60)
GFR calc non Af Amer: >60 mL/min (ref >60)
GLUCOSE: 275 mg/dL — AB (ref 65–99)
POTASSIUM: 3.5 mmol/L (ref 3.5–5.1)
Sodium: 138 mmol/L (ref 135–145)

## 2015-08-01 LAB — CBC
HEMATOCRIT: 34.6 % — AB (ref 36.0–46.0)
Hemoglobin: 11.2 g/dL — ABNORMAL LOW (ref 12.0–15.0)
MCH: 28 pg (ref 26.0–34.0)
MCHC: 32.4 g/dL (ref 30.0–36.0)
MCV: 86.5 fL (ref 78.0–100.0)
Platelets: 184 10*3/uL (ref 150–400)
RBC: 4 MIL/uL (ref 3.87–5.11)
RDW: 15 % (ref 11.5–15.5)
WBC: 5.4 10*3/uL (ref 4.0–10.5)

## 2015-08-01 LAB — HEPATIC FUNCTION PANEL
ALBUMIN: 3.6 g/dL (ref 3.5–5.0)
ALT: 40 U/L (ref 14–54)
AST: 44 U/L — ABNORMAL HIGH (ref 15–41)
Alkaline Phosphatase: 75 U/L (ref 38–126)
Bilirubin, Direct: 0.1 mg/dL (ref 0.1–0.5)
Indirect Bilirubin: 0.2 mg/dL — ABNORMAL LOW (ref 0.3–0.9)
TOTAL PROTEIN: 7.4 g/dL (ref 6.5–8.1)
Total Bilirubin: 0.3 mg/dL (ref 0.3–1.2)

## 2015-08-01 LAB — TROPONIN I: Troponin I: <0.03 ng/mL (ref <0.031)

## 2015-08-01 LAB — LIPASE, BLOOD: Lipase: 35 U/L (ref 11–51)

## 2015-08-01 MED ORDER — ONDANSETRON 4 MG PO TBDP
4.0000 mg | ORAL_TABLET | Freq: Once | ORAL | Status: AC
  Administered 2015-08-01: 4 mg via ORAL
  Filled 2015-08-01: qty 1

## 2015-08-01 MED ORDER — ONDANSETRON 8 MG PO TBDP: 8.0000 mg | ORAL_TABLET | Freq: Once | ORAL | Status: DC

## 2015-08-01 NOTE — ED Notes (Signed)
Pt upset because no pain medication ordered, pt says is leaving.  ENcourage pt to stay to wait for results but pt says she is leaving.  IV removed, cath intact.

## 2015-08-01 NOTE — ED Notes (Signed)
Informed pt of risk of leaving [facility].  Dr. Rogene Houston aware pt leaving [facility].

## 2015-08-01 NOTE — ED Notes (Signed)
Pt states that she started having left sided chest pain with nausea 2 days ago becoming worse today.  States that pain is radiating down left shoulder and arm.  Pt is from [place] and has been off of her Eliquis for 6 months due to no pcp.  Pt has not been to see a doctor since September.

## 2015-08-01 NOTE — ED Notes (Signed)
Bilateral pedal pulses present.  Left calf tender.

## 2015-08-01 NOTE — ED Notes (Signed)
Pt reports started having pain in left shoulder, left flank, and left abd since yesterday.  Today started having pain in chest as well and nausea.  Reports "Little" sob.  Pt also says started having pain in left calf while in waiting room.

## 2015-08-01 NOTE — ED Notes (Signed)
Assisted pt to restroom  

## 2015-08-01 NOTE — ED Notes (Signed)
Pt requesting nausea and pain medication.  NOtified EDP.

## 2015-08-01 NOTE — ED Provider Notes (Signed)
CSN: [id number]     Arrival date & time 08/01/15  1429 History   First MD Initiated Contact with Patient 08/01/15 1636     Chief Complaint  Patient presents with  . Chest Pain     (Consider location/radiation/quality/duration/timing/severity/associated sxs/prior Treatment) Patient is a 54 y.o. female presenting with chest pain. The history is provided by the patient and the spouse.  Chest Pain Associated symptoms: abdominal pain, back pain and nausea   Associated symptoms: no fever, no headache, no shortness of breath and not vomiting    patient presents with 2 complaints. One is the onset of chest pain at 12 noon today radiating to the left shoulder and left arm. That is been constant since it started has not gone away. The other complaint started yesterday that is left flank back and left lower quadrant abdominal pain. Patient has an allergy to dye and cannot have a CT scan with IV contrast. Patient recently moved back from [place]. Patient was evaluated here for similar complaints in September with negative workup. Patient also states she started develop pain in her left calf today. Patient is supposed to be on blood thinners for atrial fibrillation however EKG here today does not show a history of atrial fibrillation. Records show that while in [place] patient had multiple visits for chest pain and other chronic pain complaints. Also dating back to 2013 when she used to live here before there were frequent visits for pain related problems.  Past Medical History  Diagnosis Date  . Hypertension   . Hyperlipemia   . GERD (gastroesophageal reflux disease)   . Bipolar 1 disorder ([place])   . Diabetes mellitus without complication ([place])   . Atrial fibrillation ([place])   . Chronic back pain   . Gastroparesis   . IBS (irritable bowel syndrome)   . Chronic chest pain   . Asthma   . Normal cardiac stress test 02/2014    [facility]   Past Surgical History  Procedure Laterality Date  .  Cholecystectomy  10/2005  . Appendectomy  10/2005  . Abdominal hysterectomy  2001  . Right oophorectomy  1999  . Esophagogastroduodenoscopy  02/28/2006    [name], normal on daily PPI, myultiple hyperplastic polyps  . Colonoscopy  2007    Patel ([name])-pt reports hemorrhoids  . Mouth surgery     Family History  Problem Relation Age of Onset  . Cirrhosis Mother 39    ?etiology  . Cirrhosis Sister 12    ?etiology  . Diverticulitis Sister   . Cirrhosis Father     ?etiology   Social History  Substance Use Topics  . Smoking status: Passive Smoke Exposure - Never Smoker  . Smokeless tobacco: None  . Alcohol Use: No   OB History    No data available     Review of Systems  Constitutional: Negative for fever.  HENT: Negative for congestion.   Eyes: Negative for redness.  Respiratory: Negative for shortness of breath.   Cardiovascular: Positive for chest pain.  Gastrointestinal: Positive for nausea and abdominal pain. Negative for vomiting.  Genitourinary: Positive for dysuria.  Musculoskeletal: Positive for back pain.  Skin: Negative for rash.  Neurological: Negative for headaches.  Hematological: Does not bruise/bleed easily.  Psychiatric/Behavioral: Negative for confusion.      Allergies  Contrast media; Ibuprofen; Metformin and related; Nitroglycerin; Sulfa antibiotics; Bentyl; and Nitrofurantoin monohyd macro  Home Medications   Prior to Admission medications   Medication Sig Start Date End Date Taking? Authorizing  Provider  albuterol (PROAIR HFA) 108 (90 BASE) MCG/ACT inhaler Inhale 1-2 puffs into the lungs every 6 (six) hours as needed for wheezing or shortness of breath.   Yes Historical Provider, MD  albuterol (PROVENTIL) (2.5 MG/3ML) 0.083% nebulizer solution USE ONE VIAL IN NEBULIZER EVERY 6 HOURS AS NEEDED FOR SHORTNESS OF BREATH AND/OR WHEEZING   Yes Historical Provider, MD  amLODipine (NORVASC) 10 MG tablet Take 10 mg by mouth at bedtime.    Yes  Historical Provider, MD  carvedilol (COREG) 25 MG tablet Take 25 mg by mouth 2 (two) times daily.   Yes Historical Provider, MD  clonazePAM (KLONOPIN) 2 MG tablet Take 2 mg by mouth 3 (three) times daily as needed for anxiety.   Yes Historical Provider, MD  cyclobenzaprine (FLEXERIL) 10 MG tablet Take 10 mg by mouth 3 (three) times daily as needed for muscle spasms.   Yes Historical Provider, MD  divalproex (DEPAKOTE ER) 500 MG 24 hr tablet Take 1,000 mg by mouth at bedtime.   Yes Historical Provider, MD  docusate sodium (COLACE) 100 MG capsule Take 100 mg by mouth 2 (two) times daily.   Yes Historical Provider, MD  hydrochlorothiazide (HYDRODIURIL) 25 MG tablet Take 25 mg by mouth daily.   Yes Historical Provider, MD  HYDROcodone-acetaminophen (NORCO) 10-325 MG per tablet Take 1 tablet by mouth 3 (three) times daily.  05/08/15  Yes Historical Provider, MD  insulin detemir (LEVEMIR) 100 UNIT/ML injection Inject 36 Units into the skin at bedtime.    Yes Historical Provider, MD  lisinopril (PRINIVIL,ZESTRIL) 20 MG tablet Take 40 mg by mouth daily.    Yes Historical Provider, MD  NOVOLOG FLEXPEN 100 UNIT/ML FlexPen INJECT 5 UNITS THREE TIMES DAILY BASED ON SLIDING SCALE INSTRUCTIONS 05/13/15  Yes Historical Provider, MD  pravastatin (PRAVACHOL) 40 MG tablet Take 40 mg by mouth daily.   Yes Historical Provider, MD  QVAR 40 MCG/ACT inhaler Inhale 1 puff into the lungs 2 (two) times daily. 05/13/15  Yes Historical Provider, MD  traZODone (DESYREL) 150 MG tablet Take 300 mg by mouth at bedtime.   Yes Historical Provider, MD  apixaban (ELIQUIS) 5 MG TABS tablet Take 5 mg by mouth 2 (two) times daily.    Historical Provider, MD   BP 158/99 mmHg  Pulse 98  Temp(Src) 98.4 F (36.9 C) (Oral)  Resp 18  Ht 5\' 3"  (1.6 m)  Wt 257 lb (116.574 kg)  BMI 45.54 kg/m2  SpO2 99% Physical Exam  Constitutional: She is oriented to person, place, and time. She appears well-developed and well-nourished. No distress.   HENT:  Head: Normocephalic and atraumatic.  Mouth/Throat: Oropharynx is clear and moist.  Eyes: Conjunctivae and EOM are normal. Pupils are equal, round, and reactive to light.  Neck: Normal range of motion. Neck supple.  Cardiovascular: Normal rate, regular rhythm and normal heart sounds.   No murmur heard. Pulmonary/Chest: Effort normal and breath sounds normal. No respiratory distress.  Abdominal: Soft. Bowel sounds are normal. There is no tenderness. There is no guarding.  Musculoskeletal: Normal range of motion.  Neurological: She is alert and oriented to person, place, and time. No cranial nerve deficit. She exhibits normal muscle tone. Coordination normal.  Skin: Skin is warm. No rash noted.  Nursing note and vitals reviewed.   ED Course  Procedures (including critical care time) Labs Review Labs Reviewed  CBC - Abnormal; Notable for the following:    Hemoglobin 11.2 (*)    HCT 34.6 (*)  All other components within normal limits  BASIC METABOLIC PANEL - Abnormal; Notable for the following:    Glucose, Bld 275 (*)    Calcium 8.8 (*)    All other components within normal limits  HEPATIC FUNCTION PANEL - Abnormal; Notable for the following:    AST 44 (*)    Indirect Bilirubin 0.2 (*)    All other components within normal limits  TROPONIN I  LIPASE, BLOOD  URINALYSIS, ROUTINE W REFLEX MICROSCOPIC (NOT AT [facility])    Imaging Review Dg Chest 2 View  08/01/2015  CLINICAL DATA:  LEFT anterior chest pain into LEFT shoulder and down LEFT arm today, posterior LEFT chest pain into anterior chest since yesterday, personal history of asthma, bronchitis, atrial fibrillation, hypertension, diabetes mellitus, hyperlipidemia, GERD EXAM: CHEST  2 VIEW COMPARISON:  05/20/2015 FINDINGS: Upper normal heart size. Mediastinal contours and pulmonary vascularity normal. Lungs clear. No pleural effusion or pneumothorax. Osseous demineralization. IMPRESSION: No acute abnormalities. Electronically  Signed   By: Lavonia Dana M.D.   On: 08/01/2015 15:48   I have personally reviewed and evaluated these images and lab results as part of my medical decision-making.   EKG Interpretation   Date/Time:  Tuesday August 01 2015 17:12:13 EST Ventricular Rate:  79 PR Interval:  142 QRS Duration: 93 QT Interval:  411 QTC Calculation: 471 R Axis:   69 Text Interpretation:  Sinus rhythm Minimal ST depression, inferior leads  Baseline wander in lead(s) V4 No significant change since last tracing  Confirmed by Tranesha Lessner  MD, Julyssa Kyer [phone]) on 08/01/2015 5:15:11 PM      MDM   Final diagnoses:  Chest pain, unspecified chest pain type  Abdominal pain, unspecified abdominal location    The patient left AMA prior to the second troponin which was due to be ordered at 6:20 PM. In addition patient's urinalysis was pending. First troponin was negative EKG without acute changes however the second EKG did show some minimal ST segment depression. Patient stated the chest pain started at 1:00. Yesterday she had left flank pain and left abdominal pain on. Chest x-ray was negative basic labs without significant abnormalities. Patient recently moved back here from [place]. Reports from [place] show frequent evaluations for chest pain and other chronic pain complaints. Patient recently seen in September with negative workup here. Recommended the patient stay to we repeated the labs and make sure her urine was fine patient stated if she wasn't going to get pain medicine she was leaving. Patient appears to have a history of a chronic pain problem. Patient's of nausea was treated.    Fredia Sorrow, MD 08/01/15 [phone]

## 2015-09-17 DIAGNOSIS — I82409 Acute embolism and thrombosis of unspecified deep veins of unspecified lower extremity: Secondary | ICD-10-CM

## 2015-09-17 HISTORY — DX: Acute embolism and thrombosis of unspecified deep veins of unspecified lower extremity: I82.409

## 2017-07-14 ENCOUNTER — Emergency Department (HOSPITAL_COMMUNITY)
Admission: EM | Admit: 2017-07-14 | Discharge: 2017-07-15 | Disposition: A | Discharge status: Home / Self Care | Payer: Medicaid - Out of State | Attending: Emergency Medicine | Admitting: Emergency Medicine

## 2017-07-14 ENCOUNTER — Emergency Department (HOSPITAL_COMMUNITY): Payer: Medicaid - Out of State

## 2017-07-14 ENCOUNTER — Encounter (HOSPITAL_COMMUNITY): Payer: Self-pay | Admitting: *Deleted

## 2017-07-14 DIAGNOSIS — Z7722 Contact with and (suspected) exposure to environmental tobacco smoke (acute) (chronic): Secondary | ICD-10-CM | POA: Insufficient documentation

## 2017-07-14 DIAGNOSIS — E1140 Type 2 diabetes mellitus with diabetic neuropathy, unspecified: Secondary | ICD-10-CM | POA: Insufficient documentation

## 2017-07-14 DIAGNOSIS — Z91013 Allergy to seafood: Secondary | ICD-10-CM | POA: Insufficient documentation

## 2017-07-14 DIAGNOSIS — Z7901 Long term (current) use of anticoagulants: Secondary | ICD-10-CM | POA: Diagnosis not present

## 2017-07-14 DIAGNOSIS — I10 Essential (primary) hypertension: Secondary | ICD-10-CM | POA: Insufficient documentation

## 2017-07-14 DIAGNOSIS — J45909 Unspecified asthma, uncomplicated: Secondary | ICD-10-CM | POA: Insufficient documentation

## 2017-07-14 DIAGNOSIS — M79604 Pain in right leg: Secondary | ICD-10-CM | POA: Insufficient documentation

## 2017-07-14 DIAGNOSIS — Z79899 Other long term (current) drug therapy: Secondary | ICD-10-CM | POA: Insufficient documentation

## 2017-07-14 DIAGNOSIS — Z794 Long term (current) use of insulin: Secondary | ICD-10-CM | POA: Diagnosis not present

## 2017-07-14 DIAGNOSIS — R0789 Other chest pain: Secondary | ICD-10-CM | POA: Diagnosis not present

## 2017-07-14 DIAGNOSIS — I2510 Atherosclerotic heart disease of native coronary artery without angina pectoris: Secondary | ICD-10-CM | POA: Insufficient documentation

## 2017-07-14 LAB — BASIC METABOLIC PANEL
Anion gap: 12 (ref 5–15)
BUN: 14 mg/dL (ref 6–20)
CO2: 27 mmol/L (ref 22–32)
Calcium: 8.6 mg/dL — ABNORMAL LOW (ref 8.9–10.3)
Chloride: 94 mmol/L — ABNORMAL LOW (ref 101–111)
Creatinine, Ser: 0.98 mg/dL (ref 0.44–1.00)
GFR calc Af Amer: >60 mL/min (ref >60)
GFR calc non Af Amer: >60 mL/min (ref >60)
Glucose, Bld: 294 mg/dL — ABNORMAL HIGH (ref 65–99)
Potassium: 3.1 mmol/L — ABNORMAL LOW (ref 3.5–5.1)
Sodium: 133 mmol/L — ABNORMAL LOW (ref 135–145)

## 2017-07-14 LAB — CBC
HCT: 34.6 % — ABNORMAL LOW (ref 36.0–46.0)
Hemoglobin: 10.7 g/dL — ABNORMAL LOW (ref 12.0–15.0)
MCH: 25 pg — ABNORMAL LOW (ref 26.0–34.0)
MCHC: 30.9 g/dL (ref 30.0–36.0)
MCV: 80.8 fL (ref 78.0–100.0)
Platelets: 227 10*3/uL (ref 150–400)
RBC: 4.28 MIL/uL (ref 3.87–5.11)
RDW: 16.1 % — ABNORMAL HIGH (ref 11.5–15.5)
WBC: 8.3 10*3/uL (ref 4.0–10.5)

## 2017-07-14 LAB — I-STAT TROPONIN, ED
Troponin i, poc: 0 ng/mL (ref 0.00–0.08)
Troponin i, poc: 0.01 ng/mL (ref 0.00–0.08)

## 2017-07-14 MED ORDER — ONDANSETRON HCL 4 MG/2ML IJ SOLN
4.0000 mg | Freq: Once | INTRAMUSCULAR | Status: AC
  Administered 2017-07-14: 4 mg via INTRAVENOUS
  Filled 2017-07-14: qty 2

## 2017-07-14 MED ORDER — IPRATROPIUM-ALBUTEROL 0.5-2.5 (3) MG/3ML IN SOLN
3.0000 mL | Freq: Once | RESPIRATORY_TRACT | Status: AC
  Administered 2017-07-14: 3 mL via RESPIRATORY_TRACT
  Filled 2017-07-14: qty 3

## 2017-07-14 MED ORDER — MORPHINE SULFATE (PF) 4 MG/ML IV SOLN
4.0000 mg | Freq: Once | INTRAVENOUS | Status: AC
  Administered 2017-07-14: 4 mg via INTRAVENOUS
  Filled 2017-07-14: qty 1

## 2017-07-14 MED ORDER — MORPHINE SULFATE (PF) 2 MG/ML IV SOLN
2.0000 mg | Freq: Once | INTRAVENOUS | Status: AC
  Administered 2017-07-14: 2 mg via INTRAVENOUS
  Filled 2017-07-14: qty 1

## 2017-07-14 NOTE — ED Notes (Signed)
Patient transported to X-ray 

## 2017-07-14 NOTE — ED Triage Notes (Signed)
Chest pain with nausea for the past 2 hours,

## 2017-07-14 NOTE — ED Provider Notes (Signed)
[facility] EMERGENCY DEPARTMENT Provider Note   CSN: [id number] Arrival date & time: 07/14/17  1939     History   Chief Complaint Chief Complaint  Patient presents with  . Chest Pain    HPI [name] is a 56 y.o. female  with history of A. Fib, HTN, HLD, COPD, GERD, IBS, chronic chest pain, chronic back pain, DM with diabetic neuropathy and gastroparesis who presents today with chief complaint acute onset, constant left sided chest pain for 2 hours as well as intermittent right calf and right foot cramping and swelling. Patient states that she recently drove from her state of residence [place] to [place] 2 weeks ago over the span of 2 days. She endorses crampy right calf pain with radiation to the right foot for the past 2 days or so. Also endorses swelling of the right lower extremity. Pain worsens with ambulation especially in the foot. She denies recent trauma or falls. She states this feels somewhat similar to the last time she had a DVT. She is on Eliquis due to prior DVT and A. fib and states she has been compliant with this medication.  She also endorses the development of sharp left-sided chest pain which does not radiate but is constant 2 hours ago. Pain worsens with exertion. She endorses shortness of breath, DOE, improves with rest. She states she has had a cough productive of green sputum for the past 2 days. Denies fevers or chills, no palpitations. No hemoptysis. Endorses nausea with her chest pain but denies diaphoresis or lightheadedness. No vomiting or abdominal pain. Has not tried anything for her symptoms thus far. Her primary care physician and cardiologist are in [place]. Last stress test was a few months ago and was normal.  The history is provided by the patient.    Past Medical History:  Diagnosis Date  . Asthma   . Atrial fibrillation ([place])   . Bipolar 1 disorder ([place])   . Chronic back pain   . Chronic chest pain   . Diabetes mellitus without  complication ([place])   . Gastroparesis   . GERD (gastroesophageal reflux disease)   . Hyperlipemia   . Hypertension   . IBS (irritable bowel syndrome)   . Normal cardiac stress test 02/2014   [facility]    Patient Active Problem List   Diagnosis Date Noted  . BRBPR (bright red blood per rectum) 04/07/2012  . GASTRIC POLYP 10/21/2006  . HYPERLIPIDEMIA 10/21/2006  . DISORDER, BIPOLAR NOS 10/21/2006  . DEPRESSION 10/21/2006  . MIGRAINE HEADACHE 10/21/2006  . HYPERTENSION 10/21/2006  . CORONARY ARTERY DISEASE 10/21/2006  . GERD 10/21/2006  . LOW BACK PAIN 10/21/2006    Past Surgical History:  Procedure Laterality Date  . ABDOMINAL HYSTERECTOMY  2001  . APPENDECTOMY  10/2005  . CHOLECYSTECTOMY  10/2005  . COLONOSCOPY  2007   Patel ([name])-pt reports hemorrhoids  . ESOPHAGOGASTRODUODENOSCOPY  02/28/2006   [name], normal on daily PPI, myultiple hyperplastic polyps  . MOUTH SURGERY    . RIGHT OOPHORECTOMY  1999    OB History    No data available       Home Medications    Prior to Admission medications   Medication Sig Start Date End Date Taking? Authorizing Provider  albuterol (PROAIR HFA) 108 (90 BASE) MCG/ACT inhaler Inhale 1-2 puffs into the lungs every 6 (six) hours as needed for wheezing or shortness of breath.   Yes [provider]  albuterol (PROVENTIL) (2.5 MG/3ML) 0.083% nebulizer solution  USE ONE VIAL IN NEBULIZER EVERY 6 HOURS AS NEEDED FOR SHORTNESS OF BREATH AND/OR WHEEZING   Yes [provider]  amLODipine (NORVASC) 10 MG tablet Take 10 mg by mouth at bedtime.    Yes [provider]  apixaban (ELIQUIS) 5 MG TABS tablet Take 5 mg by mouth 2 (two) times daily.   Yes [provider]  atorvastatin (LIPITOR) 40 MG tablet Take 40 mg by mouth every morning.   Yes [provider]  carvedilol (COREG) 25 MG tablet Take 25 mg by mouth daily.    Yes [provider]  clonazePAM (KLONOPIN) 2 MG tablet Take 2 mg  by mouth 3 (three) times daily as needed for anxiety.   Yes [provider]  divalproex (DEPAKOTE ER) 500 MG 24 hr tablet Take 1,000 mg by mouth at bedtime.   Yes [provider]  esomeprazole (NEXIUM) 40 MG capsule Take 40 mg by mouth daily at 12 noon.   Yes [provider]  hydrochlorothiazide (HYDRODIURIL) 25 MG tablet Take 25 mg by mouth daily.   Yes [provider]  insulin detemir (LEVEMIR) 100 UNIT/ML injection Inject 56-60 Units into the skin 2 (two) times daily.    Yes [provider]  NOVOLOG FLEXPEN 100 UNIT/ML FlexPen INJECT 16-20 UNITS THREE TIMES DAILY BASED ON SLIDING SCALE INSTRUCTIONS 05/13/15  Yes [provider]  pregabalin (LYRICA) 150 MG capsule Take 150 mg by mouth 2 (two) times daily.   Yes [provider]  QUEtiapine (SEROQUEL) 300 MG tablet Take 300 mg by mouth at bedtime.   Yes [provider]  traMADol (ULTRAM) 50 MG tablet Take 50 mg by mouth 3 (three) times daily.   Yes [provider]  traZODone (DESYREL) 150 MG tablet Take 300 mg by mouth at bedtime.   Yes [provider]    Family History Family History  Problem Relation Age of Onset  . Cirrhosis Mother 27       ?etiology  . Cirrhosis Father        ?etiology  . Cirrhosis Sister 44       ?etiology  . Diverticulitis Sister     Social History Social History  Substance Use Topics  . Smoking status: Passive Smoke Exposure - Never Smoker  . Smokeless tobacco: Never Used  . Alcohol use No     Allergies   Solu-medrol [methylprednisolone acetate]; Contrast media [iodinated diagnostic agents]; Doxycycline; Ibuprofen; Keflex [cephalexin]; Metformin and related; Nitroglycerin; Nsaids; Shellfish allergy; Sulfa antibiotics; Toradol [ketorolac tromethamine]; Bentyl [dicyclomine hcl]; Blackberry flavor; Clinical cytogeneticist; Nitrofurantoin monohyd macro; and Strawberry flavor   Review of Systems Review of Systems    Constitutional: Negative for chills and fever.  Respiratory: Positive for cough and shortness of breath.   Cardiovascular: Positive for chest pain and leg swelling. Negative for palpitations.  Gastrointestinal: Positive for nausea. Negative for abdominal pain and vomiting.  Musculoskeletal: Positive for arthralgias and back pain (chronic, unchanged).  Neurological: Negative for syncope, weakness and numbness.  All other systems reviewed and are negative.    Physical Exam Updated Vital Signs BP 130/74   Pulse 74 Comment: Simultaneous filing. User may not have seen previous data.  Temp 98.5 F (36.9 C) (Oral)   Resp 18 Comment: Simultaneous filing. User may not have seen previous data.  Ht 5\' 3"  (1.6 m)   Wt 113.4 kg (250 lb)   SpO2 97% Comment: Simultaneous filing. User may not have seen previous data.  BMI 44.29 kg/m  Physical Exam  Constitutional: She appears well-developed and well-nourished. No distress.  HENT:  Head: Normocephalic and atraumatic.  Eyes: Conjunctivae are normal. Right eye exhibits no discharge. Left eye exhibits no discharge.  Neck: Normal range of motion. Neck supple. No JVD present. No tracheal deviation present.  Cardiovascular: Normal rate, regular rhythm, normal heart sounds and intact distal pulses.  Exam reveals no gallop and no friction rub.   No murmur heard. 2+ radial and DP/PT pulses bl, Homan's present on the right. Right calf measures 47 cm at its widest part as compared to 44 cm on the left. No erythema, warmth, or palpable cords   Pulmonary/Chest: Effort normal. She has wheezes. She exhibits no tenderness.  Mild wheezing in the apices. Equal rise and fall of chest, no increased work of breathing  Abdominal: Soft. Bowel sounds are normal. She exhibits no distension. There is no tenderness.  Musculoskeletal: Normal range of motion. She exhibits tenderness. She exhibits no edema.  Tenderness to palpation overlying the dorsum of the right foot  with no swelling, erythema, deformity, or crepitus. Normal range of motion of the bilateral lower extremities with normal strength. Bilateral straight leg raise present.  Neurological: She is alert. No sensory deficit.  Fluent speech, no facial droop, sensation intact to soft touch of bilateral lower extremities  Skin: Skin is warm and dry. No erythema.  Psychiatric: She has a normal mood and affect. Her behavior is normal.  Nursing note and vitals reviewed.    ED Treatments / Results  Labs (all labs ordered are listed, but only abnormal results are displayed) Labs Reviewed  BASIC METABOLIC PANEL - Abnormal; Notable for the following:       Result Value   Sodium 133 (*)    Potassium 3.1 (*)    Chloride 94 (*)    Glucose, Bld 294 (*)    Calcium 8.6 (*)    All other components within normal limits  CBC - Abnormal; Notable for the following:    Hemoglobin 10.7 (*)    HCT 34.6 (*)    MCH 25.0 (*)    RDW 16.1 (*)    All other components within normal limits  I-STAT TROPONIN, ED  I-STAT TROPONIN, ED    EKG  EKG Interpretation  Date/Time:  Monday July 14 2017 20:08:00 EDT Ventricular Rate:  81 PR Interval:    QRS Duration: 98 QT Interval:  405 QTC Calculation: 471 R Axis:   58 Text Interpretation:  Sinus rhythm Confirmed by Julianne Rice [id number]) on 07/14/2017 8:34:54 PM       Radiology Dg Chest 2 View  Result Date: 07/14/2017 CLINICAL DATA:  56 year old female with chest pain and shortness of breath. EXAM: CHEST  2 VIEW COMPARISON:  Chest radiograph dated 11/15 6 FINDINGS: The lungs are clear. There is no pleural effusion or pneumothorax. The cardiac silhouette is within normal limits. No acute osseous pathology. IMPRESSION: No active cardiopulmonary disease. Electronically Signed   By: Anner Crete M.D.   On: 07/14/2017 20:50    Procedures Procedures (including critical care time)  Medications Ordered in ED Medications  ipratropium-albuterol (DUONEB)  0.5-2.5 (3) MG/3ML nebulizer solution 3 mL (3 mLs Nebulization Given 07/14/17 2032)  morphine 2 MG/ML injection 2 mg (2 mg Intravenous Given 07/14/17 2048)  ondansetron (ZOFRAN) injection 4 mg (4 mg Intravenous Given 07/14/17 2125)  morphine 4 MG/ML injection 4 mg (4 mg Intravenous Given 07/14/17 2327)     Initial Impression / Assessment and Plan / ED Course  I have reviewed the triage vital signs and the nursing notes.  Pertinent labs & imaging results that were available during my care of the patient were reviewed by me and considered in my medical decision making (see chart for details).    Patient presents with right calf and foot pain for 3 days as well as acute onset of left-sided chest pain. Afebrile, slightly hypertensive initially with improvement while in the ED. I believe this to likely be secondary to a pain response as her symptoms improved after administration of morphine. Examination slightly suggestive of DVT, especially in the context of her travel to and from [place] recently; however patient is compliant with her eliquis and no further workup or management is required. Low suspicion of occult fracture. There may be some component of her chronic sciatica involved additionally. No red flags signs concerning for cauda equina or spinal cord compression.  EKG shows normal sinus rhythm, no evidence of arrhythmia or ST segment abnormality. Serial troponins are negative. Low suspicion of ACS or MI.Chest x-ray shows no active cardiopulmonary disease, no evidence of pneumonia, pleural effusion, or pulmonary edema. No evidence of myositis, pericarditis or bacterial bronchitis. She is PERC negative, doubt PE. She is hyperglycemic but states she ate food prior to her arrival. No evidence of DKA. No further emergent workup required at this time. Pain has been managed and on reevaluation, patient states she feels much better. She has follow-up scheduled with her primary care physician and  cardiologist in the near future in [place]. Discussed indications for return to the ED. Also advised patient to be mindful of frequent movement and ambulation during her drive back to [place]. Patient and patient's husband verbalized understanding of and agreement with plan and patient is stable for discharge home at this time.  Final Clinical Impressions(s) / ED Diagnoses   Final diagnoses:  Atypical chest pain  Right leg pain    New Prescriptions Discharge Medication List as of 07/15/2017 12:07 AM       Renita Papa, PA-C 07/15/17 1650    Julianne Rice, MD 07/18/17 1335

## 2017-07-15 NOTE — Discharge Instructions (Signed)
Continue taking your home medications as prescribed. Drink plenty of fluids and get plenty of rest. Follow-up with your cardiologist or primary care physician in 2 weeks as scheduled. Remember to take plenty of brakes on your drive back to [place]. Return to the ED if any concerning signs or symptoms develop.

## 2017-11-17 ENCOUNTER — Emergency Department (HOSPITAL_COMMUNITY): Payer: Medicaid - Out of State

## 2017-11-17 ENCOUNTER — Encounter (HOSPITAL_COMMUNITY): Payer: Self-pay | Admitting: Emergency Medicine

## 2017-11-17 ENCOUNTER — Other Ambulatory Visit: Payer: Self-pay

## 2017-11-17 ENCOUNTER — Emergency Department (HOSPITAL_COMMUNITY)
Admission: EM | Admit: 2017-11-17 | Discharge: 2017-11-17 | Disposition: A | Discharge status: Home / Self Care | Payer: Medicaid - Out of State | Attending: Emergency Medicine | Admitting: Emergency Medicine

## 2017-11-17 DIAGNOSIS — R05 Cough: Secondary | ICD-10-CM | POA: Diagnosis not present

## 2017-11-17 DIAGNOSIS — R0981 Nasal congestion: Secondary | ICD-10-CM | POA: Insufficient documentation

## 2017-11-17 DIAGNOSIS — Z7722 Contact with and (suspected) exposure to environmental tobacco smoke (acute) (chronic): Secondary | ICD-10-CM | POA: Insufficient documentation

## 2017-11-17 DIAGNOSIS — J40 Bronchitis, not specified as acute or chronic: Secondary | ICD-10-CM | POA: Diagnosis not present

## 2017-11-17 DIAGNOSIS — J0100 Acute maxillary sinusitis, unspecified: Secondary | ICD-10-CM | POA: Insufficient documentation

## 2017-11-17 DIAGNOSIS — R079 Chest pain, unspecified: Secondary | ICD-10-CM | POA: Diagnosis present

## 2017-11-17 LAB — CBC
HEMATOCRIT: 37.5 % (ref 36.0–46.0)
HEMOGLOBIN: 11.3 g/dL — AB (ref 12.0–15.0)
MCH: 24.2 pg — ABNORMAL LOW (ref 26.0–34.0)
MCHC: 30.1 g/dL (ref 30.0–36.0)
MCV: 80.5 fL (ref 78.0–100.0)
Platelets: 225 10*3/uL (ref 150–400)
RBC: 4.66 MIL/uL (ref 3.87–5.11)
RDW: 16.6 % — ABNORMAL HIGH (ref 11.5–15.5)
WBC: 6.6 10*3/uL (ref 4.0–10.5)

## 2017-11-17 LAB — BASIC METABOLIC PANEL
ANION GAP: 13 (ref 5–15)
BUN: 12 mg/dL (ref 6–20)
CO2: 24 mmol/L (ref 22–32)
Calcium: 8.9 mg/dL (ref 8.9–10.3)
Chloride: 98 mmol/L — ABNORMAL LOW (ref 101–111)
Creatinine, Ser: 0.94 mg/dL (ref 0.44–1.00)
GFR calc Af Amer: >60 mL/min (ref >60)
Glucose, Bld: 298 mg/dL — ABNORMAL HIGH (ref 65–99)
POTASSIUM: 3.7 mmol/L (ref 3.5–5.1)
SODIUM: 135 mmol/L (ref 135–145)

## 2017-11-17 LAB — D-DIMER, QUANTITATIVE: D-Dimer, Quant: 1.36 ug/mL-FEU — ABNORMAL HIGH (ref 0.00–0.50)

## 2017-11-17 MED ORDER — ACETAMINOPHEN 325 MG PO TABS
650.0000 mg | ORAL_TABLET | Freq: Once | ORAL | Status: AC
Start: 2017-11-17 — End: 2017-11-17
  Administered 2017-11-17: 650 mg via ORAL
  Filled 2017-11-17: qty 2

## 2017-11-17 MED ORDER — AZITHROMYCIN 250 MG PO TABS
500.0000 mg | ORAL_TABLET | Freq: Once | ORAL | Status: AC
  Administered 2017-11-17: 500 mg via ORAL
  Filled 2017-11-17: qty 2

## 2017-11-17 MED ORDER — APIXABAN 5 MG PO TABS: 5.0000 mg | ORAL_TABLET | Freq: Two times a day (BID) | ORAL | 0 refills | Status: DC

## 2017-11-17 MED ORDER — AZITHROMYCIN 250 MG PO TABS: 250.0000 mg | ORAL_TABLET | Freq: Every day | ORAL | 0 refills | Status: DC

## 2017-11-17 MED ORDER — LEVOFLOXACIN 500 MG PO TABS: 500.0000 mg | ORAL_TABLET | Freq: Once | ORAL | Status: DC

## 2017-11-17 MED ORDER — SODIUM CHLORIDE 0.9 % IV BOLUS (SEPSIS)
1000.0000 mL | Freq: Once | INTRAVENOUS | Status: AC
  Administered 2017-11-17: 1000 mL via INTRAVENOUS

## 2017-11-17 MED ORDER — APIXABAN 5 MG PO TABS
5.0000 mg | ORAL_TABLET | Freq: Once | ORAL | Status: AC
  Administered 2017-11-17: 5 mg via ORAL
  Filled 2017-11-17 (×2): qty 1

## 2017-11-17 NOTE — ED Triage Notes (Signed)
PT c/o stabbing left sided chest pain and bilateral lower leg pain x2 days. PT states she ran out of her blood thinning medication for the past week and drove over 1700 miles over the past week. PT c/o SOB on exertion as well. PT states hx of DVT.

## 2017-11-17 NOTE — ED Provider Notes (Signed)
[facility] Provider Note   CSN: [id number] Arrival date & time: 11/17/17  1527     History   Chief Complaint Chief Complaint  Patient presents with  . Chest Pain    HPI [name] is a 57 y.o. female.  Patient complains of cough congestion sinus congestion.  Also she is been out of her Eliquis recently.  Patient also complains of aches all over   The history is provided by the patient. No language interpreter was used.  Cough  This is a new problem. The current episode started more than 2 days ago. The problem occurs constantly. The problem has not changed since onset.Cough characteristics: Green sputum. There has been no fever. Associated symptoms include chest pain. Pertinent negatives include no headaches. She has tried nothing for the symptoms. The treatment provided no relief.    Past Medical History:  Diagnosis Date  . Asthma   . Atrial fibrillation ([place])   . Bipolar 1 disorder ([name])   . Chronic back pain   . Chronic chest pain   . Diabetes mellitus without complication ([place])   . Gastroparesis   . GERD (gastroesophageal reflux disease)   . Hyperlipemia   . Hypertension   . IBS (irritable bowel syndrome)   . Normal cardiac stress test 02/2014   [facility]    Patient Active Problem List   Diagnosis Date Noted  . BRBPR (bright red blood per rectum) 04/07/2012  . GASTRIC POLYP 10/21/2006  . HYPERLIPIDEMIA 10/21/2006  . DISORDER, BIPOLAR NOS 10/21/2006  . DEPRESSION 10/21/2006  . MIGRAINE HEADACHE 10/21/2006  . HYPERTENSION 10/21/2006  . CORONARY ARTERY DISEASE 10/21/2006  . GERD 10/21/2006  . LOW BACK PAIN 10/21/2006    Past Surgical History:  Procedure Laterality Date  . ABDOMINAL HYSTERECTOMY  2001  . APPENDECTOMY  10/2005  . CHOLECYSTECTOMY  10/2005  . COLONOSCOPY  2007   Patel ([name])-pt reports hemorrhoids  . ESOPHAGOGASTRODUODENOSCOPY  02/28/2006   [name], normal on daily PPI, myultiple hyperplastic polyps  .  MOUTH SURGERY    . RIGHT OOPHORECTOMY  1999    OB History    Gravida Para Term Preterm AB Living             4   SAB TAB Ectopic Multiple Live Births                   Home Medications    Prior to Admission medications   Medication Sig Start Date End Date Taking? Authorizing Provider  albuterol (PROAIR HFA) 108 (90 BASE) MCG/ACT inhaler Inhale 1-2 puffs into the lungs every 6 (six) hours as needed for wheezing or shortness of breath.   Yes [provider]  amLODipine (NORVASC) 10 MG tablet Take 10 mg by mouth at bedtime.    Yes [provider]  atorvastatin (LIPITOR) 40 MG tablet Take 40 mg by mouth every morning.   Yes [provider]  carvedilol (COREG) 25 MG tablet Take 25 mg by mouth daily.    Yes [provider]  clonazePAM (KLONOPIN) 1 MG tablet Take 1 mg by mouth 3 (three) times daily.   Yes [provider]  cyclobenzaprine (FLEXERIL) 10 MG tablet Take 10 mg by mouth 3 (three) times daily.   Yes [provider]  divalproex (DEPAKOTE ER) 500 MG 24 hr tablet Take 1,000 mg by mouth at bedtime.   Yes [provider]  esomeprazole (NEXIUM) 40 MG capsule Take 40 mg by mouth daily at  12 noon.   Yes [provider]  hydrochlorothiazide (HYDRODIURIL) 25 MG tablet Take 25 mg by mouth daily.   Yes [provider]  insulin detemir (LEVEMIR) 100 UNIT/ML injection Inject 70 Units into the skin 2 (two) times daily.    Yes [provider]  ipratropium-albuterol (DUONEB) 0.5-2.5 (3) MG/3ML SOLN Inhale 3 mLs into the lungs 4 (four) times daily as needed for wheezing.   Yes [provider]  NOVOLOG FLEXPEN 100 UNIT/ML FlexPen INJECT 16-20 UNITS THREE TIMES DAILY BASED ON SLIDING SCALE INSTRUCTIONS 05/13/15  Yes [provider]  pregabalin (LYRICA) 150 MG capsule Take 150 mg by mouth 2 (two) times daily.   Yes [provider]  QUEtiapine (SEROQUEL) 300 MG tablet Take 300 mg by mouth at  bedtime.   Yes [provider]  SYMBICORT 80-4.5 MCG/ACT inhaler Inhale 2 puffs into the lungs 2 (two) times daily. 09/21/17  Yes [provider]  traMADol (ULTRAM) 50 MG tablet Take 50 mg by mouth 3 (three) times daily.   Yes [provider]  traZODone (DESYREL) 150 MG tablet Take 300 mg by mouth at bedtime.   Yes [provider]  apixaban (ELIQUIS) 5 MG TABS tablet Take 1 tablet (5 mg total) by mouth 2 (two) times daily. 11/17/17   [name], MD  azithromycin (ZITHROMAX) 250 MG tablet Take 1 tablet (250 mg total) by mouth daily. 11/17/17   [name], MD    Family History Family History  Problem Relation Age of Onset  . Cirrhosis Mother 52       ?etiology  . Cirrhosis Father        ?etiology  . Cirrhosis Sister 98       ?etiology  . Diverticulitis Sister     Social History Social History   Tobacco Use  . Smoking status: Passive Smoke Exposure - Never Smoker  . Smokeless tobacco: Never Used  Substance Use Topics  . Alcohol use: No  . Drug use: No     Allergies   Solu-medrol [methylprednisolone acetate]; Contrast media [iodinated diagnostic agents]; Doxycycline; Ibuprofen; Keflex [cephalexin]; Metformin and related; Nitroglycerin; Nsaids; Shellfish allergy; Sulfa antibiotics; Toradol [ketorolac tromethamine]; Bentyl [dicyclomine hcl]; Blueberry flavor; Nitrofurantoin monohyd macro; and Strawberry flavor   Review of Systems Review of Systems  Constitutional: Negative for appetite change and fatigue.  HENT: Negative for congestion, ear discharge and sinus pressure.   Eyes: Negative for discharge.  Respiratory: Positive for cough.   Cardiovascular: Positive for chest pain.  Gastrointestinal: Negative for abdominal pain and diarrhea.  Genitourinary: Negative for frequency and hematuria.  Musculoskeletal: Negative for back pain.  Skin: Negative for rash.  Neurological: Negative for seizures and headaches.  Psychiatric/Behavioral:  Negative for hallucinations.     Physical Exam Updated Vital Signs BP (!) 146/87   Pulse 88   Temp 97.8 F (36.6 C) (Oral)   Resp 16   Ht 5\' 3"  (1.6 m)   Wt 113.4 kg (250 lb)   SpO2 100%   BMI 44.29 kg/m   Physical Exam  Constitutional: She is oriented to person, place, and time. She appears well-developed.  HENT:  Head: Normocephalic.  Eyes: Conjunctivae and EOM are normal. No scleral icterus.  Neck: Neck supple. No thyromegaly present.  Cardiovascular: Normal rate and regular rhythm. Exam reveals no gallop and no friction rub.  No murmur heard. Pulmonary/Chest: No stridor. She has no wheezes. She has no rales. She exhibits no tenderness.  Abdominal: She exhibits no distension. There is  no tenderness. There is no rebound.  Musculoskeletal: Normal range of motion. She exhibits no edema.  Lymphadenopathy:    She has no cervical adenopathy.  Neurological: She is oriented to person, place, and time. She exhibits normal muscle tone. Coordination normal.  Skin: No rash noted. No erythema.  Psychiatric: She has a normal mood and affect. Her behavior is normal.     ED Treatments / Results  Labs (all labs ordered are listed, but only abnormal results are displayed) Labs Reviewed  BASIC METABOLIC PANEL - Abnormal; Notable for the following components:      Result Value   Chloride 98 (*)    Glucose, Bld 298 (*)    All other components within normal limits  CBC - Abnormal; Notable for the following components:   Hemoglobin 11.3 (*)    MCH 24.2 (*)    RDW 16.6 (*)    All other components within normal limits  D-DIMER, QUANTITATIVE (NOT AT [facility]) - Abnormal; Notable for the following components:   D-Dimer, Quant 1.36 (*)    All other components within normal limits    EKG  EKG Interpretation  Date/Time:  Monday November 17 2017 15:35:28 EST Ventricular Rate:  102 PR Interval:  138 QRS Duration: 88 QT Interval:  358 QTC Calculation: 466 R Axis:   21 Text  Interpretation:  Sinus tachycardia Otherwise normal ECG Confirmed by [name] [phone]) on 11/17/2017 6:05:54 PM       Radiology Dg Chest 2 View  Result Date: 11/17/2017 CLINICAL DATA:  Left-sided chest pain. EXAM: CHEST  2 VIEW COMPARISON:  07/14/2017 FINDINGS: Mild right hemidiaphragm elevation. Midline trachea. Normal heart size and mediastinal contours. No pleural effusion or pneumothorax. Clear lungs. Cholecystectomy. IMPRESSION: No acute cardiopulmonary disease. Electronically Signed   By: Abigail Miyamoto M.D.   On: 11/17/2017 16:07    Procedures Procedures (including critical care time)  Medications Ordered in ED Medications  apixaban (ELIQUIS) tablet 5 mg (not administered)  azithromycin (ZITHROMAX) tablet 500 mg (not administered)  acetaminophen (TYLENOL) tablet 650 mg (650 mg Oral Given 11/17/17 1831)  sodium chloride 0.9 % bolus 1,000 mL (1,000 mLs Intravenous New Bag/Given 11/17/17 1831)     Initial Impression / Assessment and Plan / ED Course  I have reviewed the triage vital signs and the nursing notes.  Pertinent labs & imaging results that were available during my care of the patient were reviewed by me and considered in my medical decision making (see chart for details).     Labs reviewed and patient's glucose is a little elevated.  Patient is sinusitis and bronchitis and will be placed on Zithromax along with starting her back on her Eliquis and she will follow-up with primary care doctor  Final Clinical Impressions(s) / ED Diagnoses   Final diagnoses:  Acute maxillary sinusitis, recurrence not specified  Bronchitis    ED Discharge Orders        Ordered    apixaban (ELIQUIS) 5 MG TABS tablet  2 times daily     11/17/17 2008    azithromycin (ZITHROMAX) 250 MG tablet  Daily     11/17/17 2008       [name], MD 11/17/17 2012

## 2017-11-17 NOTE — Discharge Instructions (Signed)
Follow up with a family md or the health department in 1-2 weeks

## 2018-05-28 ENCOUNTER — Other Ambulatory Visit: Payer: Self-pay

## 2018-05-28 ENCOUNTER — Emergency Department (HOSPITAL_COMMUNITY): Payer: Medicaid - Out of State

## 2018-05-28 ENCOUNTER — Emergency Department (HOSPITAL_COMMUNITY)
Admission: EM | Admit: 2018-05-28 | Discharge: 2018-05-29 | Disposition: A | Discharge status: Home / Self Care | Payer: Medicaid - Out of State | Attending: Emergency Medicine | Admitting: Emergency Medicine

## 2018-05-28 ENCOUNTER — Encounter (HOSPITAL_COMMUNITY): Payer: Self-pay | Admitting: Emergency Medicine

## 2018-05-28 DIAGNOSIS — R079 Chest pain, unspecified: Secondary | ICD-10-CM | POA: Diagnosis not present

## 2018-05-28 DIAGNOSIS — Z794 Long term (current) use of insulin: Secondary | ICD-10-CM | POA: Insufficient documentation

## 2018-05-28 DIAGNOSIS — Z7901 Long term (current) use of anticoagulants: Secondary | ICD-10-CM | POA: Insufficient documentation

## 2018-05-28 DIAGNOSIS — I2510 Atherosclerotic heart disease of native coronary artery without angina pectoris: Secondary | ICD-10-CM | POA: Diagnosis not present

## 2018-05-28 DIAGNOSIS — Z7722 Contact with and (suspected) exposure to environmental tobacco smoke (acute) (chronic): Secondary | ICD-10-CM | POA: Insufficient documentation

## 2018-05-28 DIAGNOSIS — R739 Hyperglycemia, unspecified: Secondary | ICD-10-CM | POA: Diagnosis not present

## 2018-05-28 DIAGNOSIS — Z79899 Other long term (current) drug therapy: Secondary | ICD-10-CM | POA: Diagnosis not present

## 2018-05-28 DIAGNOSIS — E119 Type 2 diabetes mellitus without complications: Secondary | ICD-10-CM | POA: Insufficient documentation

## 2018-05-28 DIAGNOSIS — I10 Essential (primary) hypertension: Secondary | ICD-10-CM | POA: Insufficient documentation

## 2018-05-28 DIAGNOSIS — J45909 Unspecified asthma, uncomplicated: Secondary | ICD-10-CM | POA: Insufficient documentation

## 2018-05-28 LAB — URINALYSIS, ROUTINE W REFLEX MICROSCOPIC
BILIRUBIN URINE: NEGATIVE
Glucose, UA: 150 mg/dL — AB
Hgb urine dipstick: NEGATIVE
KETONES UR: NEGATIVE mg/dL
Leukocytes, UA: NEGATIVE
NITRITE: NEGATIVE
PROTEIN: NEGATIVE mg/dL
Specific Gravity, Urine: 1.018 (ref 1.005–1.030)
pH: 5 (ref 5.0–8.0)

## 2018-05-28 LAB — CBC
HEMATOCRIT: 35.7 % — AB (ref 36.0–46.0)
Hemoglobin: 11.3 g/dL — ABNORMAL LOW (ref 12.0–15.0)
MCH: 25.8 pg — ABNORMAL LOW (ref 26.0–34.0)
MCHC: 31.7 g/dL (ref 30.0–36.0)
MCV: 81.5 fL (ref 78.0–100.0)
PLATELETS: 219 10*3/uL (ref 150–400)
RBC: 4.38 MIL/uL (ref 3.87–5.11)
RDW: 15.9 % — AB (ref 11.5–15.5)
WBC: 7.7 10*3/uL (ref 4.0–10.5)

## 2018-05-28 LAB — BASIC METABOLIC PANEL
Anion gap: 11 (ref 5–15)
BUN: 16 mg/dL (ref 6–20)
CHLORIDE: 99 mmol/L (ref 98–111)
CO2: 27 mmol/L (ref 22–32)
CREATININE: 0.96 mg/dL (ref 0.44–1.00)
Calcium: 8.7 mg/dL — ABNORMAL LOW (ref 8.9–10.3)
GFR calc Af Amer: >60 mL/min (ref >60)
GLUCOSE: 327 mg/dL — AB (ref 70–99)
Potassium: 3.5 mmol/L (ref 3.5–5.1)
SODIUM: 137 mmol/L (ref 135–145)

## 2018-05-28 LAB — TROPONIN I: Troponin I: <0.03 ng/mL (ref <0.03)

## 2018-05-28 MED ORDER — ONDANSETRON HCL 4 MG/2ML IJ SOLN
4.0000 mg | Freq: Once | INTRAMUSCULAR | Status: AC
  Administered 2018-05-28: 4 mg via INTRAVENOUS
  Filled 2018-05-28: qty 2

## 2018-05-28 MED ORDER — FENTANYL CITRATE (PF) 100 MCG/2ML IJ SOLN
50.0000 ug | Freq: Once | INTRAMUSCULAR | Status: AC
  Administered 2018-05-28: 50 ug via INTRAVENOUS
  Filled 2018-05-28: qty 2

## 2018-05-28 NOTE — ED Provider Notes (Signed)
[facility] EMERGENCY DEPARTMENT Provider Note   CSN: [id number] Arrival date & time: 05/28/18  1948     History   Chief Complaint Chief Complaint  Patient presents with  . Chest Pain    HPI [name] is a 57 y.o. female.  Stabbing chest pain since 8 PM tonight without dyspnea, diaphoresis, nausea.  Patient has had a recent bout of bronchitis and has been coughing excessively.  She was prescribed antibiotic, inhaler, nebulizer.  Past medical history includes diabetes, hypertension, hypercholesterolemia, obesity, A. fib.  No cigarette smoking.  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  Review of systems positive for burning with urination.  Severity of symptoms is minimum.  Coughing makes symptoms worse     Past Medical History:  Diagnosis Date  . Asthma   . Atrial fibrillation ([place])   . Bipolar 1 disorder ([place])   . Chronic back pain   . Chronic chest pain   . Diabetes mellitus without complication ([place])   . Gastroparesis   . GERD (gastroesophageal reflux disease)   . Hyperlipemia   . Hypertension   . IBS (irritable bowel syndrome)   . Normal cardiac stress test 02/2014   [facility]    Patient Active Problem List   Diagnosis Date Noted  . BRBPR (bright red blood per rectum) 04/07/2012  . GASTRIC POLYP 10/21/2006  . HYPERLIPIDEMIA 10/21/2006  . DISORDER, BIPOLAR NOS 10/21/2006  . DEPRESSION 10/21/2006  . MIGRAINE HEADACHE 10/21/2006  . HYPERTENSION 10/21/2006  . CORONARY ARTERY DISEASE 10/21/2006  . GERD 10/21/2006  . LOW BACK PAIN 10/21/2006    Past Surgical History:  Procedure Laterality Date  . ABDOMINAL HYSTERECTOMY  2001  . APPENDECTOMY  10/2005  . CHOLECYSTECTOMY  10/2005  . COLONOSCOPY  2007   Patel ([name])-pt reports hemorrhoids  . ESOPHAGOGASTRODUODENOSCOPY  02/28/2006   [name], normal on daily PPI, myultiple hyperplastic polyps  . MOUTH SURGERY    . RIGHT OOPHORECTOMY  1999     OB History    Gravida      Para      Term     Preterm      AB      Living  4     SAB      TAB      Ectopic      Multiple      Live Births               Home Medications    Prior to Admission medications   Medication Sig Start Date End Date Taking? Authorizing Provider  albuterol (PROAIR HFA) 108 (90 BASE) MCG/ACT inhaler Inhale 1-2 puffs into the lungs every 6 (six) hours as needed for wheezing or shortness of breath.   Yes [provider]  amLODipine (NORVASC) 10 MG tablet Take 10 mg by mouth at bedtime.    Yes [provider]  apixaban (ELIQUIS) 5 MG TABS tablet Take 1 tablet (5 mg total) by mouth 2 (two) times daily. 11/17/17  Yes [name], MD  atorvastatin (LIPITOR) 40 MG tablet Take 40 mg by mouth every morning.   Yes [provider]  carvedilol (COREG) 25 MG tablet Take 25 mg by mouth daily.    Yes [provider]  clonazePAM (KLONOPIN) 2 MG tablet Take 2 mg by mouth 3 (three) times daily.    Yes [provider]  divalproex (DEPAKOTE ER) 500 MG 24 hr tablet Take 1,000 mg by mouth at bedtime.  Yes [provider]  hydrochlorothiazide (HYDRODIURIL) 50 MG tablet Take 50 mg by mouth daily.    Yes [provider]  insulin detemir (LEVEMIR) 100 UNIT/ML injection Inject 72 Units into the skin 2 (two) times daily.    Yes [provider]  ipratropium-albuterol (DUONEB) 0.5-2.5 (3) MG/3ML SOLN Inhale 3 mLs into the lungs 4 (four) times daily as needed for wheezing.   Yes [provider]  NOVOLOG FLEXPEN 100 UNIT/ML FlexPen Inject 20 Units into the skin 4 (four) times daily.  05/13/15  Yes [provider]  ondansetron (ZOFRAN) 8 MG tablet Take 8 mg by mouth every 8 (eight) hours as needed for nausea or vomiting.  05/22/18  Yes [provider]  pregabalin (LYRICA) 150 MG capsule Take 150 mg by mouth 3 (three) times daily.    Yes [provider]  QUEtiapine (SEROQUEL) 300 MG tablet Take 300 mg by mouth at bedtime.    Yes [provider]  SYMBICORT 80-4.5 MCG/ACT inhaler Inhale 2 puffs into the lungs daily as needed (for shortness of breath).  09/21/17  Yes [provider]  traZODone (DESYREL) 150 MG tablet Take 300 mg by mouth at bedtime.   Yes [provider]    Family History Family History  Problem Relation Age of Onset  . Cirrhosis Mother 13       ?etiology  . Cirrhosis Father        ?etiology  . Cirrhosis Sister 13       ?etiology  . Diverticulitis Sister     Social History Social History   Tobacco Use  . Smoking status: Passive Smoke Exposure - Never Smoker  . Smokeless tobacco: Never Used  Substance Use Topics  . Alcohol use: No  . Drug use: No     Allergies   Solu-medrol [methylprednisolone acetate]; Contrast media [iodinated diagnostic agents]; Doxycycline; Ibuprofen; Keflex [cephalexin]; Metformin and related; Nitroglycerin; Nsaids; Shellfish allergy; Sulfa antibiotics; Toradol [ketorolac tromethamine]; Bentyl [dicyclomine hcl]; Blueberry flavor; Nitrofurantoin monohyd macro; and Strawberry flavor   Review of Systems Review of Systems  All other systems reviewed and are negative.    Physical Exam Updated Vital Signs BP (!) 153/73   Pulse 80   Temp 98.2 F (36.8 C) (Oral)   Resp 15   Ht 5\' 3"  (1.6 m)   Wt 111.6 kg   SpO2 97%   BMI 43.58 kg/m   Physical Exam  Constitutional: She is oriented to person, place, and time. She appears well-developed and well-nourished.  Overweight, no acute distress.  HENT:  Head: Normocephalic and atraumatic.  Eyes: Conjunctivae are normal.  Neck: Neck supple.  Cardiovascular: Normal rate and regular rhythm.  Pulmonary/Chest: Effort normal and breath sounds normal.  Abdominal: Soft. Bowel sounds are normal.  Musculoskeletal: Normal range of motion.  Neurological: She is alert and oriented to person, place, and time.  Skin: Skin is warm and dry.  Psychiatric: She has a normal mood and affect. Her  behavior is normal.  Nursing note and vitals reviewed.    ED Treatments / Results  Labs (all labs ordered are listed, but only abnormal results are displayed) Labs Reviewed  BASIC METABOLIC PANEL - Abnormal; Notable for the following components:      Result Value   Glucose, Bld 327 (*)    Calcium 8.7 (*)    All other components within normal limits  CBC - Abnormal; Notable for the following components:   Hemoglobin 11.3 (*)    HCT 35.7 (*)  MCH 25.8 (*)    RDW 15.9 (*)    All other components within normal limits  URINALYSIS, ROUTINE W REFLEX MICROSCOPIC - Abnormal; Notable for the following components:   APPearance HAZY (*)    Glucose, UA 150 (*)    All other components within normal limits  TROPONIN I  TROPONIN I    EKG EKG Interpretation  Date/Time:  Thursday May 28 2018 20:13:36 EDT Ventricular Rate:  81 PR Interval:  142 QRS Duration: 94 QT Interval:  398 QTC Calculation: 462 R Axis:   25 Text Interpretation:  Normal sinus rhythm Cannot rule out Anterior infarct , age undetermined Abnormal ECG Confirmed by Nat Christen [phone]) on 05/28/2018 10:44:39 PM   Radiology Dg Chest 2 View  Result Date: 05/28/2018 CLINICAL DATA:  Chest pain EXAM: CHEST - 2 VIEW COMPARISON:  11/17/2017 FINDINGS: The heart size and mediastinal contours are within normal limits. Both lungs are clear. Degenerative osteophytes of the spine. IMPRESSION: No active cardiopulmonary disease. Electronically Signed   By: Donavan Foil M.D.   On: 05/28/2018 21:00    Procedures Procedures (including critical care time)  Medications Ordered in ED Medications  ondansetron (ZOFRAN) injection 4 mg (4 mg Intravenous Given 05/28/18 2321)  fentaNYL (SUBLIMAZE) injection 50 mcg (50 mcg Intravenous Given 05/28/18 2321)     Initial Impression / Assessment and Plan / ED Course  I have reviewed the triage vital signs and the nursing notes.  Pertinent labs & imaging results that were available during  my care of the patient were reviewed by me and considered in my medical decision making (see chart for details).     History and physical more consistent with chest wall pain secondary to excessive coughing.  However, patient has multiple cardiac risk factors.  EKG, chest x-ray, and first troponin negative.  Second troponin pending.  Discussed with Dr. Stark Jock  Final Clinical Impressions(s) / ED Diagnoses   Final diagnoses:  Chest pain, unspecified type  Hyperglycemia    ED Discharge Orders    None       Nat Christen, MD 05/28/18 2354
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
## 2018-05-28 NOTE — Discharge Instructions (Addendum)
Your glucose was elevated.  Tests related to your heart were acceptable.  Follow-up with your primary care doctor.  You need to see an endocrinologist for your diabetes.  Phone number given.

## 2018-05-29 LAB — TROPONIN I

## 2022-10-06 ENCOUNTER — Encounter (HOSPITAL_COMMUNITY): Payer: Self-pay

## 2022-10-06 ENCOUNTER — Other Ambulatory Visit: Payer: Self-pay

## 2022-10-06 ENCOUNTER — Emergency Department (HOSPITAL_COMMUNITY): Payer: Medicaid - Out of State

## 2022-10-06 ENCOUNTER — Emergency Department (HOSPITAL_COMMUNITY)
Admission: EM | Admit: 2022-10-06 | Discharge: 2022-10-07 | Disposition: A | Discharge status: Home / Self Care | Payer: Medicaid - Out of State | Attending: Emergency Medicine | Admitting: Emergency Medicine

## 2022-10-06 DIAGNOSIS — Z7951 Long term (current) use of inhaled steroids: Secondary | ICD-10-CM | POA: Diagnosis not present

## 2022-10-06 DIAGNOSIS — R7989 Other specified abnormal findings of blood chemistry: Secondary | ICD-10-CM | POA: Diagnosis not present

## 2022-10-06 DIAGNOSIS — I160 Hypertensive urgency: Secondary | ICD-10-CM | POA: Diagnosis not present

## 2022-10-06 DIAGNOSIS — Z20822 Contact with and (suspected) exposure to covid-19: Secondary | ICD-10-CM | POA: Diagnosis not present

## 2022-10-06 DIAGNOSIS — Z7901 Long term (current) use of anticoagulants: Secondary | ICD-10-CM | POA: Diagnosis not present

## 2022-10-06 DIAGNOSIS — R5383 Other fatigue: Secondary | ICD-10-CM | POA: Insufficient documentation

## 2022-10-06 DIAGNOSIS — Z794 Long term (current) use of insulin: Secondary | ICD-10-CM | POA: Diagnosis not present

## 2022-10-06 DIAGNOSIS — E1165 Type 2 diabetes mellitus with hyperglycemia: Secondary | ICD-10-CM | POA: Insufficient documentation

## 2022-10-06 DIAGNOSIS — R079 Chest pain, unspecified: Secondary | ICD-10-CM

## 2022-10-06 DIAGNOSIS — I10 Essential (primary) hypertension: Secondary | ICD-10-CM | POA: Diagnosis not present

## 2022-10-06 DIAGNOSIS — J45909 Unspecified asthma, uncomplicated: Secondary | ICD-10-CM | POA: Diagnosis not present

## 2022-10-06 DIAGNOSIS — Z79899 Other long term (current) drug therapy: Secondary | ICD-10-CM | POA: Diagnosis not present

## 2022-10-06 LAB — HEPATIC FUNCTION PANEL
ALT: 19 U/L (ref 0–44)
AST: 19 U/L (ref 15–41)
Albumin: 3.2 g/dL — ABNORMAL LOW (ref 3.5–5.0)
Alkaline Phosphatase: 61 U/L (ref 38–126)
Bilirubin, Direct: <0.1 mg/dL (ref 0.0–0.2)
Total Bilirubin: 0.5 mg/dL (ref 0.3–1.2)
Total Protein: 8.1 g/dL (ref 6.5–8.1)

## 2022-10-06 LAB — BASIC METABOLIC PANEL
Anion gap: 9 (ref 5–15)
BUN: 14 mg/dL (ref 6–20)
CO2: 21 mmol/L — ABNORMAL LOW (ref 22–32)
Calcium: 8.7 mg/dL — ABNORMAL LOW (ref 8.9–10.3)
Chloride: 104 mmol/L (ref 98–111)
Creatinine, Ser: 1.16 mg/dL — ABNORMAL HIGH (ref 0.44–1.00)
GFR, Estimated: 54 mL/min — ABNORMAL LOW (ref >60)
Glucose, Bld: 149 mg/dL — ABNORMAL HIGH (ref 70–99)
Potassium: 3.9 mmol/L (ref 3.5–5.1)
Sodium: 134 mmol/L — ABNORMAL LOW (ref 135–145)

## 2022-10-06 LAB — CBC
HCT: 35.6 % — ABNORMAL LOW (ref 36.0–46.0)
Hemoglobin: 11.4 g/dL — ABNORMAL LOW (ref 12.0–15.0)
MCH: 26.5 pg (ref 26.0–34.0)
MCHC: 32 g/dL (ref 30.0–36.0)
MCV: 82.8 fL (ref 80.0–100.0)
Platelets: 212 10*3/uL (ref 150–400)
RBC: 4.3 MIL/uL (ref 3.87–5.11)
RDW: 15.9 % — ABNORMAL HIGH (ref 11.5–15.5)
WBC: 5.3 10*3/uL (ref 4.0–10.5)
nRBC: 0 % (ref 0.0–0.2)

## 2022-10-06 LAB — LIPASE, BLOOD: Lipase: 36 U/L (ref 11–51)

## 2022-10-06 LAB — TROPONIN I (HIGH SENSITIVITY)
Troponin I (High Sensitivity): 3 ng/L (ref <18)
Troponin I (High Sensitivity): 3 ng/L (ref <18)

## 2022-10-06 LAB — MAGNESIUM: Magnesium: 1.8 mg/dL (ref 1.7–2.4)

## 2022-10-06 LAB — CBG MONITORING, ED: Glucose-Capillary: 147 mg/dL — ABNORMAL HIGH (ref 70–99)

## 2022-10-06 MED ORDER — HYDROMORPHONE HCL 1 MG/ML IJ SOLN
1.0000 mg | Freq: Once | INTRAMUSCULAR | Status: AC
  Administered 2022-10-06: 1 mg via INTRAVENOUS
  Filled 2022-10-06: qty 1

## 2022-10-06 MED ORDER — FENTANYL CITRATE PF 50 MCG/ML IJ SOSY: 50.0000 ug | PREFILLED_SYRINGE | Freq: Once | INTRAMUSCULAR | Status: DC

## 2022-10-06 MED ORDER — METHYLPREDNISOLONE SODIUM SUCC 40 MG IJ SOLR
40.0000 mg | Freq: Once | INTRAMUSCULAR | Status: AC
  Administered 2022-10-06: 40 mg via INTRAVENOUS
  Filled 2022-10-06: qty 1

## 2022-10-06 MED ORDER — LABETALOL HCL 5 MG/ML IV SOLN: 20.0000 mg | Freq: Once | INTRAVENOUS | Status: DC

## 2022-10-06 MED ORDER — HYDROMORPHONE HCL 1 MG/ML IJ SOLN
0.5000 mg | Freq: Once | INTRAMUSCULAR | Status: AC
  Administered 2022-10-06: 0.5 mg via INTRAVENOUS
  Filled 2022-10-06: qty 0.5

## 2022-10-06 MED ORDER — DIPHENHYDRAMINE HCL 25 MG PO CAPS: 50.0000 mg | ORAL_CAPSULE | Freq: Once | ORAL | Status: AC

## 2022-10-06 MED ORDER — ONDANSETRON HCL 4 MG/2ML IJ SOLN
4.0000 mg | Freq: Once | INTRAMUSCULAR | Status: AC
  Administered 2022-10-06: 4 mg via INTRAVENOUS
  Filled 2022-10-06: qty 2

## 2022-10-06 MED ORDER — DIPHENHYDRAMINE HCL 50 MG/ML IJ SOLN
50.0000 mg | Freq: Once | INTRAMUSCULAR | Status: AC
  Administered 2022-10-06: 50 mg via INTRAVENOUS
  Filled 2022-10-06: qty 1

## 2022-10-06 MED ORDER — LACTATED RINGERS IV BOLUS
1000.0000 mL | Freq: Once | INTRAVENOUS | Status: AC
  Administered 2022-10-06: 1000 mL via INTRAVENOUS

## 2022-10-06 NOTE — ED Provider Notes (Signed)
Accepted handoff at shift change from [employer], PA-C. Please see prior provider note for more detail.   Briefly: Patient is 62 y.o. who presented to the emergency department complaining of chest pain, fatigue, nausea and diaphoresis for the past 2 days.  Chest pain is right-sided and radiates to her back.  Pain is worse with exertion and there is also associated shortness of breath.  Patient was found to be very hypertensive with initial blood pressure of 224/115.  DDX: concern for ACS, aortic dissection, PE  Plan: Continue to monitor patient's blood pressure after IV fluids and medication.  Troponin has been negative and EKG without ischemic findings.  There is still concern for aortic dissection given patient's symptoms and hypertension.  Awaiting dissection study, patient requires premedication due to allergy to contrast.  Results for orders placed or performed during the hospital encounter of 09/81/19  Basic metabolic panel  Result Value Ref Range   Sodium 134 (L) 135 - 145 mmol/L   Potassium 3.9 3.5 - 5.1 mmol/L   Chloride 104 98 - 111 mmol/L   CO2 21 (L) 22 - 32 mmol/L   Glucose, Bld 149 (H) 70 - 99 mg/dL   BUN 14 6 - 20 mg/dL   Creatinine, Ser 1.16 (H) 0.44 - 1.00 mg/dL   Calcium 8.7 (L) 8.9 - 10.3 mg/dL   GFR, Estimated 54 (L) >60 mL/min   Anion gap 9 5 - 15  CBC  Result Value Ref Range   WBC 5.3 4.0 - 10.5 K/uL   RBC 4.30 3.87 - 5.11 MIL/uL   Hemoglobin 11.4 (L) 12.0 - 15.0 g/dL   HCT 35.6 (L) 36.0 - 46.0 %   MCV 82.8 80.0 - 100.0 fL   MCH 26.5 26.0 - 34.0 pg   MCHC 32.0 30.0 - 36.0 g/dL   RDW 15.9 (H) 11.5 - 15.5 %   Platelets 212 150 - 400 K/uL   nRBC 0.0 0.0 - 0.2 %  Lipase, blood  Result Value Ref Range   Lipase 36 11 - 51 U/L  Hepatic function panel  Result Value Ref Range   Total Protein 8.1 6.5 - 8.1 g/dL   Albumin 3.2 (L) 3.5 - 5.0 g/dL   AST 19 15 - 41 U/L   ALT 19 0 - 44 U/L   Alkaline Phosphatase 61 38 - 126 U/L   Total Bilirubin 0.5 0.3 - 1.2  mg/dL   Bilirubin, Direct <0.1 0.0 - 0.2 mg/dL   Indirect Bilirubin NOT CALCULATED 0.3 - 0.9 mg/dL  Magnesium  Result Value Ref Range   Magnesium 1.8 1.7 - 2.4 mg/dL  POC CBG, ED  Result Value Ref Range   Glucose-Capillary 147 (H) 70 - 99 mg/dL  Troponin I (High Sensitivity)  Result Value Ref Range   Troponin I (High Sensitivity) 3 <18 ng/L  Troponin I (High Sensitivity)  Result Value Ref Range   Troponin I (High Sensitivity) 3 <18 ng/L   DG Chest Port 1 View  Result Date: 10/06/2022 CLINICAL DATA:  Nauseous. EXAM: PORTABLE CHEST 1 VIEW COMPARISON:  Chest radiograph 05/28/2018 FINDINGS: The heart size and mediastinal contours are within normal limits. Both lungs are clear. The visualized skeletal structures are unremarkable. IMPRESSION: No active disease. Electronically Signed   By: Lovey Newcomer M.D.   On: 10/06/2022 17:43      Physical Exam  BP (!) 153/77   Pulse 70   Temp 97.8 F (36.6 C)   Resp (!) 21   Ht '5\' 3"'$  (  1.6 m)   Wt 108.4 kg   SpO2 98%   BMI 42.34 kg/m   Physical Exam Vitals and nursing note reviewed.  Constitutional:      General: She is not in acute distress.    Appearance: Normal appearance. She is not ill-appearing or diaphoretic.  Pulmonary:     Effort: Pulmonary effort is normal.  Neurological:     Mental Status: She is alert. Mental status is at baseline.  Psychiatric:        Mood and Affect: Mood normal.        Behavior: Behavior normal.     Procedures  Procedures  ED Course / MDM    Medical Decision Making Amount and/or Complexity of Data Reviewed Labs: ordered. Radiology: ordered.  Risk Prescription drug management.   Patient presented to ED complaining of chest pain and was found to be hypertensive with an initial BP of 224/115.  Patient BP has been improving, but is still hypertensive in ED.  She was treated with dilaudid for pain and given a fluid bolus.  CT dissection study was ordered and allergy prophylaxis was initiated due to  contrast allergy.  Patient received Solu-Medrol without incident and is now resting comfortably.    Laboratory work up is reassuring.  Troponins have been negative.  Lipase is within normal range, no concern for pancreatitis.  LFTs are also within normal range.  Patient was able to ambulate to the bathroom, but reports that she has an increase in her pain and nausea.  She describes her pain on the right side of her chest extending into her back.  She has also been taking her diabetic medications but has not eaten today.  We will check a blood glucose.  Will also add on a respiratory panel.  Results are still pending.   Patient received benadryl dose 1 hour too early and will need to be re-dosed before patient's scan.  Order was put in for another 50 mg dose to be given.   11:00 PM Care transferred to Dr. Sedonia Small at the end of my shift as the patient will require reassessment once labs/imaging have resulted. Patient presentation, ED course, and plan of care discussed with review of all pertinent labs and imaging. Please see his/her note for further details regarding further ED course and disposition. Plan at time of handoff is await results of CT dissection study and monitor patient's BP while treating her pain and nausea. This may be altered or completely changed at the discretion of the oncoming team pending results of further workup.         Pat Kocher, [place] 10/06/22 2312    Godfrey Pick, MD 10/09/22 [phone]
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
## 2022-10-06 NOTE — ED Provider Notes (Signed)
[facility] Provider Note   CSN: [id number] Arrival date & time: 10/06/22  1659     History  Chief Complaint  Patient presents with   Chest Pain    [name] is a 62 y.o. female with history of asthma, A-fib, bipolar disorder, diabetes, hypertension, hyperlipidemia, DVT, NSTEMI who presents the emergency department complaining of chest pain.  Patient states that she has been feeling very fatigued, nauseous, and sweaty for the past 2 days.  She is having some right-sided chest discomfort that radiates to her back.  This is worse when standing up or exerting herself.  Some associated shortness of breath.  No vomiting.  States that she has been taking all her medications as prescribed.  No leg pain or swelling.  Overall she states that her symptoms feel similar to when she had a heart attack several years ago.   Chest Pain Associated symptoms: diaphoresis, fatigue and nausea        Home Medications Prior to Admission medications   Medication Sig Start Date End Date Taking? Authorizing Provider  albuterol (PROAIR HFA) 108 (90 BASE) MCG/ACT inhaler Inhale 1-2 puffs into the lungs every 6 (six) hours as needed for wheezing or shortness of breath.    [provider]  amLODipine (NORVASC) 10 MG tablet Take 10 mg by mouth at bedtime.     [provider]  apixaban (ELIQUIS) 5 MG TABS tablet Take 1 tablet (5 mg total) by mouth 2 (two) times daily. 11/17/17   [name], MD  atorvastatin (LIPITOR) 40 MG tablet Take 40 mg by mouth every morning.    [provider]  carvedilol (COREG) 25 MG tablet Take 25 mg by mouth daily.     [provider]  clonazePAM (KLONOPIN) 2 MG tablet Take 2 mg by mouth 3 (three) times daily.     [provider]  divalproex (DEPAKOTE ER) 500 MG 24 hr tablet Take 1,000 mg by mouth at bedtime.    [provider]  hydrochlorothiazide (HYDRODIURIL) 50 MG tablet Take 50 mg  by mouth daily.     [provider]  insulin detemir (LEVEMIR) 100 UNIT/ML injection Inject 72 Units into the skin 2 (two) times daily.     [provider]  ipratropium-albuterol (DUONEB) 0.5-2.5 (3) MG/3ML SOLN Inhale 3 mLs into the lungs 4 (four) times daily as needed for wheezing.    [provider]  NOVOLOG FLEXPEN 100 UNIT/ML FlexPen Inject 20 Units into the skin 4 (four) times daily.  05/13/15   [provider]  ondansetron (ZOFRAN) 8 MG tablet Take 8 mg by mouth every 8 (eight) hours as needed for nausea or vomiting.  05/22/18   [provider]  pregabalin (LYRICA) 150 MG capsule Take 150 mg by mouth 3 (three) times daily.     [provider]  QUEtiapine (SEROQUEL) 300 MG tablet Take 300 mg by mouth at bedtime.    [provider]  SYMBICORT 80-4.5 MCG/ACT inhaler Inhale 2 puffs into the lungs daily as needed (for shortness of breath).  09/21/17   [provider]  traZODone (DESYREL) 150 MG tablet Take 300 mg by mouth at bedtime.    [provider]      Allergies    Solu-medrol [methylprednisolone sodium succ], Contrast media [iodinated contrast media], Doxycycline, Ibuprofen, Keflex [cephalexin], Metformin and related, Nitroglycerin, Nsaids, Shellfish allergy, Sulfa antibiotics, Toradol [ketorolac tromethamine], Bentyl [dicyclomine hcl], Blueberry flavor, Nitrofurantoin monohyd macro, and  Strawberry flavor    Review of Systems   Review of Systems  Constitutional:  Positive for diaphoresis and fatigue.  Cardiovascular:  Positive for chest pain.  Gastrointestinal:  Positive for nausea.  All other systems reviewed and are negative.   Physical Exam Updated Vital Signs BP (!) 224/115 (BP Location: Right Arm)   Pulse 85   Temp 97.8 F (36.6 C)   Resp 17   Ht '5\' 3"'$  (1.6 m)   Wt 108.4 kg   SpO2 100%   BMI 42.34 kg/m  Physical Exam Vitals and nursing note reviewed.  Constitutional:      Appearance: Normal  appearance.  HENT:     Head: Normocephalic and atraumatic.  Eyes:     Conjunctiva/sclera: Conjunctivae normal.  Cardiovascular:     Rate and Rhythm: Normal rate and regular rhythm.  Pulmonary:     Effort: Pulmonary effort is normal. No respiratory distress.     Breath sounds: Normal breath sounds.  Abdominal:     General: There is no distension.     Palpations: Abdomen is soft.     Tenderness: There is no abdominal tenderness.  Skin:    General: Skin is warm and dry.     Coloration: Skin is pale.  Neurological:     General: No focal deficit present.     Mental Status: She is alert.     Comments: Moving all extremities without difficulty. Sensation intact.     ED Results / Procedures / Treatments   Labs (all labs ordered are listed, but only abnormal results are displayed) Labs Reviewed  BASIC METABOLIC PANEL - Abnormal; Notable for the following components:      Result Value   Sodium 134 (*)    CO2 21 (*)    Glucose, Bld 149 (*)    Creatinine, Ser 1.16 (*)    Calcium 8.7 (*)    GFR, Estimated 54 (*)    All other components within normal limits  CBC - Abnormal; Notable for the following components:   Hemoglobin 11.4 (*)    HCT 35.6 (*)    RDW 15.9 (*)    All other components within normal limits  TROPONIN I (HIGH SENSITIVITY)  TROPONIN I (HIGH SENSITIVITY)    EKG EKG Interpretation  Date/Time:  Sunday October 06 2022 17:11:14 EST Ventricular Rate:  80 PR Interval:  140 QRS Duration: 88 QT Interval:  392 QTC Calculation: 452 R Axis:   47 Text Interpretation: Normal sinus rhythm Confirmed by Godfrey Pick [phone]) on 10/06/2022 6:40:34 PM  Radiology DG Chest Port 1 View  Result Date: 10/06/2022 CLINICAL DATA:  Nauseous. EXAM: PORTABLE CHEST 1 VIEW COMPARISON:  Chest radiograph 05/28/2018 FINDINGS: The heart size and mediastinal contours are within normal limits. Both lungs are clear. The visualized skeletal structures are unremarkable. IMPRESSION: No active disease.  Electronically Signed   By: Lovey Newcomer M.D.   On: 10/06/2022 17:43    Procedures Procedures    Medications Ordered in ED Medications  labetalol (NORMODYNE) injection 20 mg (has no administration in time range)    ED Course/ Medical Decision Making/ A&P                             Medical Decision Making Amount and/or Complexity of Data Reviewed Labs: ordered. Radiology: ordered.   This patient is a 62 y.o. female  who presents to the ED for concern of chest pain x 2 days.  Differential diagnoses prior to evaluation: The emergent differential diagnosis includes, but is not limited to,  ACS, pericarditis, myocarditis, aortic dissection, PE, pneumothorax, esophageal spasm or rupture, chronic angina, pneumonia, bronchitis, GERD, reflux/PUD, biliary disease, pancreatitis, costochondritis, anxiety. This is not an exhaustive differential.   Past Medical History / Co-morbidities: asthma, A-fib, bipolar disorder, diabetes, hypertension, hyperlipidemia, DVT, NSTEMI  Additional history: Chart reviewed. Pertinent results include: most of previous medical records from [place]. Cannot access records from the time around patient's prior NSTEMI.   Physical Exam: Physical exam performed. The pertinent findings include: Severely hypertensive to 224/115. Heart regular rhythm.  Normal respiratory effort.  No leg swelling.  Lab Tests/Imaging studies: I personally interpreted labs/imaging and the pertinent results include: CBC with stable hemoglobin.  BMP with mildly elevated creatinine, otherwise unremarkable.  Initial troponin of 3, delta troponin pending.  Chest x-ray without acute abnormalities. I agree with the radiologist interpretation.  CT dissection study ordered and pending.   Cardiac monitoring: EKG obtained and interpreted by my attending physician which shows: normal sinus rhythm   Medications: I ordered medication including labetalol.  I have reviewed the patients home medicines  and have made adjustments as needed.   Disposition: Patient discussed and care transferred to [facility] at shift change. Please see his/her note for further details regarding further ED course and disposition. Plan at time of handoff is follow up on patient's blood pressure after given IV medications. Suspect as initial troponin is negative and EKG without ischemic findings that patient's chest pain and fatigue is likely due to elevated blood pressure. No findings of acute end organ function. Awaiting dissection study.  Final Clinical Impression(s) / ED Diagnoses Final diagnoses:  Chest pain, unspecified type  Hypertensive urgency    Rx / DC Orders ED Discharge Orders     None      Portions of this report may have been transcribed using voice recognition software. Every effort was made to ensure accuracy; however, inadvertent computerized transcription errors may be present.    Estill Cotta 10/06/22 1903    Godfrey Pick, MD 10/09/22 [phone]

## 2022-10-06 NOTE — ED Triage Notes (Signed)
Pt reports feeling very tired, nauseous and intermittent episodes of sweating x 2 days.  Reports this feels similar to when she had a heart attack.

## 2022-10-07 ENCOUNTER — Emergency Department (HOSPITAL_COMMUNITY): Payer: Medicaid - Out of State

## 2022-10-07 LAB — RESP PANEL BY RT-PCR (RSV, FLU A&B, COVID)  RVPGX2
Influenza A by PCR: NEGATIVE
Influenza B by PCR: NEGATIVE
Resp Syncytial Virus by PCR: NEGATIVE
SARS Coronavirus 2 by RT PCR: NEGATIVE

## 2022-10-07 MED ORDER — HYDROXYZINE HCL 25 MG PO TABS: 25.0000 mg | ORAL_TABLET | Freq: Four times a day (QID) | ORAL | 0 refills | Status: DC | PRN

## 2022-10-07 MED ORDER — IOHEXOL 350 MG/ML SOLN
100.0000 mL | Freq: Once | INTRAVENOUS | Status: AC | PRN
  Administered 2022-10-07: 100 mL via INTRAVENOUS

## 2022-10-07 NOTE — Discharge Instructions (Signed)
You were evaluated in the Emergency Department and after careful evaluation, we did not find any emergent condition requiring admission or further testing in the hospital.  Your exam/testing today is overall reassuring.  Recommend removing stresses out of your life as we discussed and following up with your primary care doctor and/or cardiologist.  Please return to the Emergency Department if you experience any worsening of your condition.   Thank you for allowing [place] to be a part of your care.

## 2022-10-30 ENCOUNTER — Encounter (HOSPITAL_COMMUNITY): Payer: Self-pay

## 2022-10-30 ENCOUNTER — Other Ambulatory Visit: Payer: Self-pay

## 2022-10-30 ENCOUNTER — Emergency Department (HOSPITAL_COMMUNITY)
Admission: EM | Admit: 2022-10-30 | Discharge: 2022-10-30 | Disposition: A | Discharge status: Home / Self Care | Payer: Medicaid - Out of State | Attending: Emergency Medicine | Admitting: Emergency Medicine

## 2022-10-30 ENCOUNTER — Emergency Department (HOSPITAL_COMMUNITY): Payer: Medicaid - Out of State

## 2022-10-30 DIAGNOSIS — K3184 Gastroparesis: Secondary | ICD-10-CM | POA: Insufficient documentation

## 2022-10-30 DIAGNOSIS — M542 Cervicalgia: Secondary | ICD-10-CM | POA: Diagnosis not present

## 2022-10-30 DIAGNOSIS — Z7901 Long term (current) use of anticoagulants: Secondary | ICD-10-CM | POA: Diagnosis not present

## 2022-10-30 DIAGNOSIS — Z79899 Other long term (current) drug therapy: Secondary | ICD-10-CM | POA: Diagnosis not present

## 2022-10-30 DIAGNOSIS — R42 Dizziness and giddiness: Secondary | ICD-10-CM | POA: Diagnosis present

## 2022-10-30 DIAGNOSIS — M25511 Pain in right shoulder: Secondary | ICD-10-CM | POA: Diagnosis not present

## 2022-10-30 DIAGNOSIS — R55 Syncope and collapse: Secondary | ICD-10-CM | POA: Diagnosis not present

## 2022-10-30 DIAGNOSIS — I10 Essential (primary) hypertension: Secondary | ICD-10-CM | POA: Diagnosis not present

## 2022-10-30 DIAGNOSIS — Z794 Long term (current) use of insulin: Secondary | ICD-10-CM | POA: Insufficient documentation

## 2022-10-30 LAB — COMPREHENSIVE METABOLIC PANEL
ALT: 18 U/L (ref 0–44)
AST: 18 U/L (ref 15–41)
Albumin: 3.2 g/dL — ABNORMAL LOW (ref 3.5–5.0)
Alkaline Phosphatase: 65 U/L (ref 38–126)
Anion gap: 9 (ref 5–15)
BUN: 18 mg/dL (ref 6–20)
CO2: 24 mmol/L (ref 22–32)
Calcium: 8.7 mg/dL — ABNORMAL LOW (ref 8.9–10.3)
Chloride: 102 mmol/L (ref 98–111)
Creatinine, Ser: 1.13 mg/dL — ABNORMAL HIGH (ref 0.44–1.00)
GFR, Estimated: 56 mL/min — ABNORMAL LOW (ref >60)
Glucose, Bld: 134 mg/dL — ABNORMAL HIGH (ref 70–99)
Potassium: 3.4 mmol/L — ABNORMAL LOW (ref 3.5–5.1)
Sodium: 135 mmol/L (ref 135–145)
Total Bilirubin: 0.3 mg/dL (ref 0.3–1.2)
Total Protein: 8 g/dL (ref 6.5–8.1)

## 2022-10-30 LAB — CBC WITH DIFFERENTIAL/PLATELET
Abs Immature Granulocytes: 0.02 10*3/uL (ref 0.00–0.07)
Basophils Absolute: 0 10*3/uL (ref 0.0–0.1)
Basophils Relative: 1 %
Eosinophils Absolute: 0.1 10*3/uL (ref 0.0–0.5)
Eosinophils Relative: 2 %
HCT: 32.8 % — ABNORMAL LOW (ref 36.0–46.0)
Hemoglobin: 10.5 g/dL — ABNORMAL LOW (ref 12.0–15.0)
Immature Granulocytes: 0 %
Lymphocytes Relative: 27 %
Lymphs Abs: 1.6 10*3/uL (ref 0.7–4.0)
MCH: 26.9 pg (ref 26.0–34.0)
MCHC: 32 g/dL (ref 30.0–36.0)
MCV: 83.9 fL (ref 80.0–100.0)
Monocytes Absolute: 0.4 10*3/uL (ref 0.1–1.0)
Monocytes Relative: 6 %
Neutro Abs: 3.8 10*3/uL (ref 1.7–7.7)
Neutrophils Relative %: 64 %
Platelets: 183 10*3/uL (ref 150–400)
RBC: 3.91 MIL/uL (ref 3.87–5.11)
RDW: 16.4 % — ABNORMAL HIGH (ref 11.5–15.5)
WBC: 6 10*3/uL (ref 4.0–10.5)
nRBC: 0 % (ref 0.0–0.2)

## 2022-10-30 LAB — TROPONIN I (HIGH SENSITIVITY): Troponin I (High Sensitivity): 5 ng/L (ref <18)

## 2022-10-30 MED ORDER — METOCLOPRAMIDE HCL 10 MG PO TABS: 10.0000 mg | ORAL_TABLET | Freq: Four times a day (QID) | ORAL | 0 refills | Status: DC

## 2022-10-30 MED ORDER — ONDANSETRON HCL 4 MG/2ML IJ SOLN
4.0000 mg | Freq: Once | INTRAMUSCULAR | Status: AC
  Administered 2022-10-30: 4 mg via INTRAVENOUS
  Filled 2022-10-30: qty 2

## 2022-10-30 MED ORDER — LACTATED RINGERS IV BOLUS
1000.0000 mL | Freq: Once | INTRAVENOUS | Status: AC
  Administered 2022-10-30: 1000 mL via INTRAVENOUS

## 2022-10-30 NOTE — ED Provider Notes (Signed)
[facility]  Provider Note  CSN: [id number] Arrival date & time: 10/30/22 [id number]  History Chief Complaint  Patient presents with   Dizziness    [name] is a 62 y.o. female with history of multiple medical problems recently relocated from [place] to [place], reports several days of poor appetite, persistent nausea as well as postural dizziness without LOC. She has also had several days of MSK R neck/shoulder pain, similar to previous. Denies fever, vomiting, CP, SOB. She got up during the night to use the rest room and thought she was going to pass out so she had a family friend bring her to the ED. No bowel or bladder symptoms.    Home Medications Prior to Admission medications   Medication Sig Start Date End Date Taking? Authorizing Provider  metoCLOPramide (REGLAN) 10 MG tablet Take 1 tablet (10 mg total) by mouth every 6 (six) hours. 10/30/22  Yes Truddie Hidden, MD  albuterol (PROAIR HFA) 108 (90 BASE) MCG/ACT inhaler Inhale 1-2 puffs into the lungs every 6 (six) hours as needed for wheezing or shortness of breath.    [provider]  amLODipine (NORVASC) 10 MG tablet Take 10 mg by mouth at bedtime.     [provider]  apixaban (ELIQUIS) 5 MG TABS tablet Take 1 tablet (5 mg total) by mouth 2 (two) times daily. 11/17/17   [name], MD  atorvastatin (LIPITOR) 40 MG tablet Take 40 mg by mouth every morning.    [provider]  carvedilol (COREG) 25 MG tablet Take 25 mg by mouth daily.     [provider]  clonazePAM (KLONOPIN) 2 MG tablet Take 2 mg by mouth 3 (three) times daily.     [provider]  divalproex (DEPAKOTE ER) 500 MG 24 hr tablet Take 1,000 mg by mouth at bedtime.    [provider]  hydrochlorothiazide (HYDRODIURIL) 50 MG tablet Take 50 mg by mouth daily.     [provider]  hydrOXYzine (ATARAX) 25 MG tablet Take 1 tablet (25 mg total) by mouth every 6  (six) hours as needed for anxiety. 10/07/22   Maudie Flakes, MD  insulin detemir (LEVEMIR) 100 UNIT/ML injection Inject 72 Units into the skin 2 (two) times daily.     [provider]  ipratropium-albuterol (DUONEB) 0.5-2.5 (3) MG/3ML SOLN Inhale 3 mLs into the lungs 4 (four) times daily as needed for wheezing.    [provider]  NOVOLOG FLEXPEN 100 UNIT/ML FlexPen Inject 20 Units into the skin 4 (four) times daily.  05/13/15   [provider]  ondansetron (ZOFRAN) 8 MG tablet Take 8 mg by mouth every 8 (eight) hours as needed for nausea or vomiting.  05/22/18   [provider]  pregabalin (LYRICA) 150 MG capsule Take 150 mg by mouth 3 (three) times daily.     [provider]  QUEtiapine (SEROQUEL) 300 MG tablet Take 300 mg by mouth at bedtime.    [provider]  SYMBICORT 80-4.5 MCG/ACT inhaler Inhale 2 puffs into the lungs daily as needed (for shortness of breath).  09/21/17   [provider]  traZODone (DESYREL) 150 MG tablet Take 300 mg by mouth at bedtime.    [provider]     Allergies    Solu-medrol [methylprednisolone sodium succ], Contrast media [iodinated contrast media], Doxycycline, Ibuprofen, Keflex [cephalexin], Metformin and related, Nitroglycerin, Nsaids, Shellfish allergy, Sulfa antibiotics, Toradol [ketorolac tromethamine], Bentyl [dicyclomine  hcl], Blueberry flavor, Nitrofurantoin monohyd macro, and Strawberry flavor   Review of Systems   Review of Systems Please see HPI for pertinent positives and negatives  Physical Exam BP (!) 134/92   Pulse 77   Temp 98.6 F (37 C)   Resp 19   Wt 108 kg   SpO2 100%   BMI 42.16 kg/m   Physical Exam Vitals and nursing note reviewed.  Constitutional:      Appearance: Normal appearance.  HENT:     Head: Normocephalic and atraumatic.     Nose: Nose normal.     Mouth/Throat:     Mouth: Mucous membranes are moist.  Eyes:     Extraocular Movements:  Extraocular movements intact.     Conjunctiva/sclera: Conjunctivae normal.  Cardiovascular:     Rate and Rhythm: Normal rate.  Pulmonary:     Effort: Pulmonary effort is normal.     Breath sounds: Normal breath sounds.  Abdominal:     General: Abdomen is flat.     Palpations: Abdomen is soft.     Tenderness: There is no abdominal tenderness. There is no guarding.  Musculoskeletal:        General: Tenderness (R trapezius area) present. No swelling. Normal range of motion.     Cervical back: Neck supple.  Skin:    General: Skin is warm and dry.  Neurological:     General: No focal deficit present.     Mental Status: She is alert.  Psychiatric:        Mood and Affect: Mood normal.     ED Results / Procedures / Treatments   EKG EKG Interpretation  Date/Time:  Wednesday October 30 2022 03:05:01 EST Ventricular Rate:  69 PR Interval:  148 QRS Duration: 103 QT Interval:  423 QTC Calculation: [id number] Axis:   50 Text Interpretation: Sinus rhythm Probable left ventricular hypertrophy No significant change since last tracing Confirmed by Calvert Cantor [phone]) on 10/30/2022 3:27:44 AM  Procedures Procedures  Medications Ordered in the ED Medications  ondansetron [facility]) injection 4 mg (4 mg Intravenous Given 10/30/22 0347)  lactated ringers bolus 1,000 mL (1,000 mLs Intravenous New Bag/Given 10/30/22 0346)    Initial Impression and Plan  Patient well appearing here for nausea, poor PO intake and postural dizziness as well as MSK R shoulder pain. She has zofran at home but reports it hasn't helped much. She has history of poorly controlled HTN and is hypertensive at rest here, BP does drop significantly with standing. Will check labs, EKG, CXR and give IVF for possible dehydration.   ED Course   Clinical Course as of 10/30/22 0441  Wed Oct 30, 2022  0408 CBC with anemia about at baseline.  [CS]  0422 I personally viewed the images from radiology studies and agree with  radiologist interpretation: CXR is clear [CS]  [id number] CMP and Trop are unremarkable. Given duration of her symptoms, single trop is sufficient to rule out ACS. She reports she is feeling better after IVF and Zofran. She has had Reglan in the past which she thinks helped some too. She is comfortable going home. No indication for admission at this time. PCP follow up, RTED for any other concerns.   [CS]    Clinical Course User Index [CS] Truddie Hidden, MD     MDM Rules/Calculators/A&P Medical Decision Making Given presenting complaint, I considered that admission might be necessary. After review of results from ED lab and/or imaging studies, admission to the hospital is  not indicated at this time.    Problems Addressed: Gastroparesis: chronic illness or injury with exacerbation, progression, or side effects of treatment Neck pain on right side: acute illness or injury Postural dizziness with near syncope: acute illness or injury  Amount and/or Complexity of Data Reviewed Labs: ordered. Decision-making details documented in ED Course. Radiology: ordered and independent interpretation performed. Decision-making details documented in ED Course. ECG/medicine tests: ordered and independent interpretation performed. Decision-making details documented in ED Course.  Risk Prescription drug management. Decision regarding hospitalization.     Final Clinical Impression(s) / ED Diagnoses Final diagnoses:  Postural dizziness with near syncope  Neck pain on right side  Gastroparesis    Rx / DC Orders ED Discharge Orders          Ordered    metoCLOPramide (REGLAN) 10 MG tablet  Every 6 hours        10/30/22 0441             Truddie Hidden, MD 10/30/22 [phone]

## 2022-10-30 NOTE — ED Triage Notes (Addendum)
Pt pov c/o dizziness and weakness. Feels like she is going to "pass out". Complaining of neck and head pain on right side. Endorses nausea.

## 2022-11-09 DIAGNOSIS — I10 Essential (primary) hypertension: Secondary | ICD-10-CM | POA: Diagnosis not present

## 2022-11-09 DIAGNOSIS — M542 Cervicalgia: Secondary | ICD-10-CM | POA: Diagnosis present

## 2022-11-09 DIAGNOSIS — Z7901 Long term (current) use of anticoagulants: Secondary | ICD-10-CM | POA: Diagnosis not present

## 2022-11-09 DIAGNOSIS — I4891 Unspecified atrial fibrillation: Secondary | ICD-10-CM | POA: Insufficient documentation

## 2022-11-09 DIAGNOSIS — Z7722 Contact with and (suspected) exposure to environmental tobacco smoke (acute) (chronic): Secondary | ICD-10-CM | POA: Insufficient documentation

## 2022-11-09 DIAGNOSIS — Z794 Long term (current) use of insulin: Secondary | ICD-10-CM | POA: Diagnosis not present

## 2022-11-09 DIAGNOSIS — R079 Chest pain, unspecified: Secondary | ICD-10-CM | POA: Insufficient documentation

## 2022-11-09 DIAGNOSIS — I2510 Atherosclerotic heart disease of native coronary artery without angina pectoris: Secondary | ICD-10-CM | POA: Diagnosis not present

## 2022-11-09 DIAGNOSIS — J45909 Unspecified asthma, uncomplicated: Secondary | ICD-10-CM | POA: Insufficient documentation

## 2022-11-09 DIAGNOSIS — E119 Type 2 diabetes mellitus without complications: Secondary | ICD-10-CM | POA: Insufficient documentation

## 2022-11-09 DIAGNOSIS — E871 Hypo-osmolality and hyponatremia: Secondary | ICD-10-CM | POA: Diagnosis not present

## 2022-11-09 DIAGNOSIS — Z79899 Other long term (current) drug therapy: Secondary | ICD-10-CM | POA: Insufficient documentation

## 2022-11-10 ENCOUNTER — Emergency Department (HOSPITAL_COMMUNITY)
Admission: EM | Admit: 2022-11-10 | Discharge: 2022-11-10 | Disposition: A | Discharge status: Home / Self Care | Payer: Medicaid - Out of State | Attending: Student | Admitting: Student

## 2022-11-10 ENCOUNTER — Emergency Department (HOSPITAL_COMMUNITY): Payer: Medicaid - Out of State

## 2022-11-10 ENCOUNTER — Other Ambulatory Visit: Payer: Self-pay

## 2022-11-10 DIAGNOSIS — M542 Cervicalgia: Secondary | ICD-10-CM

## 2022-11-10 LAB — COMPREHENSIVE METABOLIC PANEL
ALT: 21 U/L (ref 0–44)
AST: 25 U/L (ref 15–41)
Albumin: 3.2 g/dL — ABNORMAL LOW (ref 3.5–5.0)
Alkaline Phosphatase: 67 U/L (ref 38–126)
Anion gap: 10 (ref 5–15)
BUN: 21 mg/dL — ABNORMAL HIGH (ref 6–20)
CO2: 23 mmol/L (ref 22–32)
Calcium: 8.4 mg/dL — ABNORMAL LOW (ref 8.9–10.3)
Chloride: 100 mmol/L (ref 98–111)
Creatinine, Ser: 1.15 mg/dL — ABNORMAL HIGH (ref 0.44–1.00)
GFR, Estimated: 55 mL/min — ABNORMAL LOW (ref >60)
Glucose, Bld: 225 mg/dL — ABNORMAL HIGH (ref 70–99)
Potassium: 3.6 mmol/L (ref 3.5–5.1)
Sodium: 133 mmol/L — ABNORMAL LOW (ref 135–145)
Total Bilirubin: 0.5 mg/dL (ref 0.3–1.2)
Total Protein: 8 g/dL (ref 6.5–8.1)

## 2022-11-10 LAB — CBC WITH DIFFERENTIAL/PLATELET
Abs Immature Granulocytes: 0.03 10*3/uL (ref 0.00–0.07)
Basophils Absolute: 0 10*3/uL (ref 0.0–0.1)
Basophils Relative: 1 %
Eosinophils Absolute: 0.1 10*3/uL (ref 0.0–0.5)
Eosinophils Relative: 2 %
HCT: 31.7 % — ABNORMAL LOW (ref 36.0–46.0)
Hemoglobin: 10.2 g/dL — ABNORMAL LOW (ref 12.0–15.0)
Immature Granulocytes: 1 %
Lymphocytes Relative: 25 %
Lymphs Abs: 1.6 10*3/uL (ref 0.7–4.0)
MCH: 27.1 pg (ref 26.0–34.0)
MCHC: 32.2 g/dL (ref 30.0–36.0)
MCV: 84.3 fL (ref 80.0–100.0)
Monocytes Absolute: 0.3 10*3/uL (ref 0.1–1.0)
Monocytes Relative: 5 %
Neutro Abs: 4.4 10*3/uL (ref 1.7–7.7)
Neutrophils Relative %: 66 %
Platelets: 189 10*3/uL (ref 150–400)
RBC: 3.76 MIL/uL — ABNORMAL LOW (ref 3.87–5.11)
RDW: 16.4 % — ABNORMAL HIGH (ref 11.5–15.5)
WBC: 6.5 10*3/uL (ref 4.0–10.5)
nRBC: 0 % (ref 0.0–0.2)

## 2022-11-10 LAB — TROPONIN I (HIGH SENSITIVITY): Troponin I (High Sensitivity): 4 ng/L (ref <18)

## 2022-11-10 MED ORDER — LIDOCAINE 5 % EX PTCH: 1.0000 | MEDICATED_PATCH | CUTANEOUS | 0 refills | Status: DC

## 2022-11-10 MED ORDER — HYDROCODONE-ACETAMINOPHEN 5-325 MG PO TABS
1.0000 | ORAL_TABLET | Freq: Once | ORAL | Status: AC
  Administered 2022-11-10: 1 via ORAL
  Filled 2022-11-10: qty 1

## 2022-11-10 MED ORDER — NAPROXEN 250 MG PO TABS: 500.0000 mg | ORAL_TABLET | Freq: Once | ORAL | Status: DC

## 2022-11-10 MED ORDER — LIDOCAINE 5 % EX PTCH
1.0000 | MEDICATED_PATCH | CUTANEOUS | Status: DC
  Administered 2022-11-10: 1 via TRANSDERMAL
  Filled 2022-11-10: qty 1

## 2022-11-10 NOTE — ED Provider Notes (Signed)
[facility] Provider Note  CSN: [id number] Arrival date & time: 11/09/22 2358  Chief Complaint(s) Chest Pain and Neck Pain  HPI [name] is a 62 y.o. female with PMH A-fib on Eliquis, bipolar 1, chronic back pain, chronic neck pain, diabetic gastroparesis, diabetes, HTN, HLD who presents emergency department for evaluation of neck and chest pain.  Patient states that she has had pain in the neck for a while but over the last 3 days it has significantly worsened.  Pain primarily along the distribution of the SCM on the right that extends to the sternal notch on the right.  No significant trauma to the neck.  Patient taking Tylenol at home without improvement.  Denies numbness, tingling, weakness or other neurologic complaints.  Denies shortness of breath, abdominal pain, nausea, diaphoresis or other systemic complaints.   Past Medical History Past Medical History:  Diagnosis Date   Asthma    Atrial fibrillation ([place])    Bipolar 1 disorder ([place])    Chronic back pain    Chronic chest pain    Diabetes mellitus without complication (HCC)    Gastroparesis    GERD (gastroesophageal reflux disease)    Hyperlipemia    Hypertension    IBS (irritable bowel syndrome)    Normal cardiac stress test 02/2014   [facility]   Patient Active Problem List   Diagnosis Date Noted   BRBPR (bright red blood per rectum) 04/07/2012   GASTRIC POLYP 10/21/2006   HYPERLIPIDEMIA 10/21/2006   DISORDER, BIPOLAR NOS 10/21/2006   DEPRESSION 10/21/2006   MIGRAINE HEADACHE 10/21/2006   HYPERTENSION 10/21/2006   CORONARY ARTERY DISEASE 10/21/2006   GERD 10/21/2006   LOW BACK PAIN 10/21/2006   Home Medication(s) Prior to Admission medications   Medication Sig Start Date End Date Taking? Authorizing Provider  albuterol (PROAIR HFA) 108 (90 BASE) MCG/ACT inhaler Inhale 1-2 puffs into the lungs every 6 (six) hours as needed for wheezing or shortness of breath.     [provider]  amLODipine (NORVASC) 10 MG tablet Take 10 mg by mouth at bedtime.     [provider]  apixaban (ELIQUIS) 5 MG TABS tablet Take 1 tablet (5 mg total) by mouth 2 (two) times daily. 11/17/17   [name], MD  atorvastatin (LIPITOR) 40 MG tablet Take 40 mg by mouth every morning.    [provider]  carvedilol (COREG) 25 MG tablet Take 25 mg by mouth daily.     [provider]  clonazePAM (KLONOPIN) 2 MG tablet Take 2 mg by mouth 3 (three) times daily.     [provider]  divalproex (DEPAKOTE ER) 500 MG 24 hr tablet Take 1,000 mg by mouth at bedtime.    [provider]  hydrochlorothiazide (HYDRODIURIL) 50 MG tablet Take 50 mg by mouth daily.     [provider]  hydrOXYzine (ATARAX) 25 MG tablet Take 1 tablet (25 mg total) by mouth every 6 (six) hours as needed for anxiety. 10/07/22   Maudie Flakes, MD  insulin detemir (LEVEMIR) 100 UNIT/ML injection Inject 72 Units into the skin 2 (two) times daily.     [provider]  ipratropium-albuterol (DUONEB) 0.5-2.5 (3) MG/3ML SOLN Inhale 3 mLs into the lungs 4 (four) times daily as needed for wheezing.    [provider]  metoCLOPramide (REGLAN) 10 MG tablet Take 1 tablet (10 mg total) by mouth every 6 (six) hours. 10/30/22   Truddie Hidden,  MD  NOVOLOG FLEXPEN 100 UNIT/ML FlexPen Inject 20 Units into the skin 4 (four) times daily.  05/13/15   [provider]  ondansetron (ZOFRAN) 8 MG tablet Take 8 mg by mouth every 8 (eight) hours as needed for nausea or vomiting.  05/22/18   [provider]  pregabalin (LYRICA) 150 MG capsule Take 150 mg by mouth 3 (three) times daily.     [provider]  QUEtiapine (SEROQUEL) 300 MG tablet Take 300 mg by mouth at bedtime.    [provider]  SYMBICORT 80-4.5 MCG/ACT inhaler Inhale 2 puffs into the lungs daily as needed (for shortness of breath).  09/21/17   [provider]   traZODone (DESYREL) 150 MG tablet Take 300 mg by mouth at bedtime.    [provider]                                                                                                                                    Past Surgical History Past Surgical History:  Procedure Laterality Date   ABDOMINAL HYSTERECTOMY  2001   APPENDECTOMY  10/2005   CHOLECYSTECTOMY  10/2005   COLONOSCOPY  2007   [facility] ([name])-pt reports hemorrhoids   ESOPHAGOGASTRODUODENOSCOPY  02/28/2006   [name], normal on daily PPI, myultiple hyperplastic polyps   MOUTH SURGERY     RIGHT OOPHORECTOMY  1999   Family History Family History  Problem Relation Age of Onset   Cirrhosis Mother 66       ?etiology   Cirrhosis Father        ?etiology   Cirrhosis Sister 9       ?etiology   Diverticulitis Sister     Social History Social History   Tobacco Use   Smoking status: Passive Smoke Exposure - Never Smoker   Smokeless tobacco: Never  Vaping Use   Vaping Use: Never used  Substance Use Topics   Alcohol use: No   Drug use: No   Allergies Solu-medrol [methylprednisolone sodium succ], Contrast media [iodinated contrast media], Doxycycline, Ibuprofen, Keflex [cephalexin], Metformin and related, Nitroglycerin, Nsaids, Shellfish allergy, Sulfa antibiotics, Toradol [ketorolac tromethamine], Bentyl [dicyclomine hcl], Blueberry flavor, Nitrofurantoin monohyd macro, and Strawberry flavor  Review of Systems Review of Systems  Cardiovascular:  Positive for chest pain.  Musculoskeletal:  Positive for neck pain.    Physical Exam Vital Signs  I have reviewed the triage vital signs BP (!) 160/86   Pulse 83   Temp 98 F (36.7 C)   Resp 18   Ht '5\' 3"'$  (1.6 m)   Wt 111.1 kg   SpO2 97%   BMI 43.40 kg/m   Physical Exam Vitals and nursing note reviewed.  Constitutional:      General: She is not in acute distress.    Appearance: She is well-developed.  HENT:     Head: Normocephalic and atraumatic.   Eyes:     Conjunctiva/sclera: Conjunctivae normal.  Cardiovascular:     Rate and Rhythm: Normal rate and regular rhythm.     Heart sounds: No murmur heard. Pulmonary:     Effort: Pulmonary effort is normal. No respiratory distress.     Breath sounds: Normal breath sounds.  Abdominal:     Palpations: Abdomen is soft.     Tenderness: There is no abdominal tenderness.  Musculoskeletal:        General: No swelling.     Cervical back: Neck supple. Tenderness present.  Skin:    General: Skin is warm and dry.     Capillary Refill: Capillary refill takes less than 2 seconds.  Neurological:     Mental Status: She is alert.  Psychiatric:        Mood and Affect: Mood normal.     ED Results and Treatments Labs (all labs ordered are listed, but only abnormal results are displayed) Labs Reviewed  COMPREHENSIVE METABOLIC PANEL  CBC WITH DIFFERENTIAL/PLATELET  TROPONIN I (HIGH SENSITIVITY)                                                                                                                          Radiology No results found.  Pertinent labs & imaging results that were available during my care of the patient were reviewed by me and considered in my medical decision making (see MDM for details).  Medications Ordered in ED Medications  lidocaine (LIDODERM) 5 % 1 patch (has no administration in time range)  naproxen (NAPROSYN) tablet 500 mg (has no administration in time range)                                                                                                                                     Procedures Procedures  (including critical care time)  Medical Decision Making / ED Course   This patient presents to the ED for concern of neck pain, chest pain, this involves an extensive number of treatment options, and is a complaint that carries with it a high risk of complications and morbidity.  The differential diagnosis includes cervical strain, fracture,  pneumonia, ACS  MDM: Patient seen emergency room for evaluation of neck pain and chest pain.  Physical exam with tenderness along the distribution of the SCM on the right.  Cardiopulmonary exam unremarkable.  Laboratory evaluation with a mild hyponatremia 133, BUN 21, creatinine 1.15, albumin 3.2, hemoglobin 10.2 but  is otherwise unremarkable.  High-sensitivity troponin is normal at 4.  ECG nonischemic with no evidence of ST depressions or elevations.  Chest x-ray unremarkable.  Patient given single dose Norco and a Lidoderm patch and on reevaluation symptoms have significant improved.  She has improved range of motion of her neck.  I have very low suspicion for ACS at the time and patient is low risk by Wells criteria, thus low suspicion for pulmonary embolism.  Patient presentation consistent with cervical strain and patient discharged with Lidoderm patches and outpatient follow-up.  She was given strict return precautions which she voiced understanding and she was discharged.   Additional history obtained: -Additional history obtained from husband -External records from outside source obtained and reviewed including: Chart review including previous notes, labs, imaging, consultation notes   Lab Tests: -I ordered, reviewed, and interpreted labs.   The pertinent results include:   Labs Reviewed  COMPREHENSIVE METABOLIC PANEL  CBC WITH DIFFERENTIAL/PLATELET  TROPONIN I (HIGH SENSITIVITY)      EKG   EKG Interpretation  Date/Time:  Sunday November 10 2022 00:12:28 EST Ventricular Rate:  77 PR Interval:  145 QRS Duration: 102 QT Interval:  410 QTC Calculation: 464 R Axis:   50 Text Interpretation: Sinus rhythm Confirmed by [place] ([id number]) on 11/10/2022 12:18:29 AM         Imaging Studies ordered: I ordered imaging studies including chest x-ray I independently visualized and interpreted imaging. I agree with the radiologist interpretation   Medicines ordered and  prescription drug management: Meds ordered this encounter  Medications   lidocaine (LIDODERM) 5 % 1 patch   naproxen (NAPROSYN) tablet 500 mg    -I have reviewed the patients home medicines and have made adjustments as needed  Critical interventions none    Cardiac Monitoring: The patient was maintained on a cardiac monitor.  I personally viewed and interpreted the cardiac monitored which showed an underlying rhythm of: NSR  Social Determinants of Health:  Factors impacting patients care include: none   Reevaluation: After the interventions noted above, I reevaluated the patient and found that they have :improved  Co morbidities that complicate the patient evaluation  Past Medical History:  Diagnosis Date   Asthma    Atrial fibrillation ([place])    Bipolar 1 disorder ([facility])    Chronic back pain    Chronic chest pain    Diabetes mellitus without complication ([place])    Gastroparesis    GERD (gastroesophageal reflux disease)    Hyperlipemia    Hypertension    IBS (irritable bowel syndrome)    Normal cardiac stress test 02/2014   [facility]      Dispostion: I considered admission for this patient, but at this time she does not meet inpatient criteria for admission she is safe for discharge with outpatient follow-up     Final Clinical Impression(s) / ED Diagnoses Final diagnoses:  None     '@PCDICTATION'$ @    Teressa Lower, MD 11/10/22 [phone]

## 2022-11-10 NOTE — ED Triage Notes (Signed)
Pt states she started having pain to right side of next about a week ago. Pain started radiating to chest approx. 3 days ago.

## 2023-01-18 ENCOUNTER — Other Ambulatory Visit: Payer: Self-pay

## 2023-01-18 ENCOUNTER — Emergency Department (HOSPITAL_COMMUNITY): Payer: Medicaid - Out of State

## 2023-01-18 ENCOUNTER — Encounter (HOSPITAL_COMMUNITY): Payer: Self-pay

## 2023-01-18 ENCOUNTER — Emergency Department (HOSPITAL_COMMUNITY)
Admission: EM | Admit: 2023-01-18 | Discharge: 2023-01-18 | Discharge status: Against Medical Advice | Payer: Medicaid - Out of State | Attending: Emergency Medicine | Admitting: Emergency Medicine

## 2023-01-18 DIAGNOSIS — E119 Type 2 diabetes mellitus without complications: Secondary | ICD-10-CM | POA: Insufficient documentation

## 2023-01-18 DIAGNOSIS — Z79899 Other long term (current) drug therapy: Secondary | ICD-10-CM | POA: Insufficient documentation

## 2023-01-18 DIAGNOSIS — I2510 Atherosclerotic heart disease of native coronary artery without angina pectoris: Secondary | ICD-10-CM | POA: Diagnosis not present

## 2023-01-18 DIAGNOSIS — R079 Chest pain, unspecified: Secondary | ICD-10-CM | POA: Diagnosis present

## 2023-01-18 DIAGNOSIS — Z955 Presence of coronary angioplasty implant and graft: Secondary | ICD-10-CM | POA: Insufficient documentation

## 2023-01-18 DIAGNOSIS — Z7902 Long term (current) use of antithrombotics/antiplatelets: Secondary | ICD-10-CM | POA: Insufficient documentation

## 2023-01-18 DIAGNOSIS — Z7722 Contact with and (suspected) exposure to environmental tobacco smoke (acute) (chronic): Secondary | ICD-10-CM | POA: Insufficient documentation

## 2023-01-18 DIAGNOSIS — Z5329 Procedure and treatment not carried out because of patient's decision for other reasons: Secondary | ICD-10-CM | POA: Diagnosis not present

## 2023-01-18 DIAGNOSIS — Z7901 Long term (current) use of anticoagulants: Secondary | ICD-10-CM | POA: Diagnosis not present

## 2023-01-18 DIAGNOSIS — J45909 Unspecified asthma, uncomplicated: Secondary | ICD-10-CM | POA: Diagnosis not present

## 2023-01-18 DIAGNOSIS — Z794 Long term (current) use of insulin: Secondary | ICD-10-CM | POA: Insufficient documentation

## 2023-01-18 DIAGNOSIS — I10 Essential (primary) hypertension: Secondary | ICD-10-CM | POA: Insufficient documentation

## 2023-01-18 LAB — BASIC METABOLIC PANEL
Anion gap: 8 (ref 5–15)
BUN: 20 mg/dL (ref 6–20)
CO2: 26 mmol/L (ref 22–32)
Calcium: 8.8 mg/dL — ABNORMAL LOW (ref 8.9–10.3)
Chloride: 98 mmol/L (ref 98–111)
Creatinine, Ser: 1.24 mg/dL — ABNORMAL HIGH (ref 0.44–1.00)
GFR, Estimated: 50 mL/min — ABNORMAL LOW (ref >60)
Glucose, Bld: 229 mg/dL — ABNORMAL HIGH (ref 70–99)
Potassium: 3.9 mmol/L (ref 3.5–5.1)
Sodium: 132 mmol/L — ABNORMAL LOW (ref 135–145)

## 2023-01-18 LAB — CBC
HCT: 33.2 % — ABNORMAL LOW (ref 36.0–46.0)
Hemoglobin: 10.5 g/dL — ABNORMAL LOW (ref 12.0–15.0)
MCH: 26.9 pg (ref 26.0–34.0)
MCHC: 31.6 g/dL (ref 30.0–36.0)
MCV: 85.1 fL (ref 80.0–100.0)
Platelets: 194 10*3/uL (ref 150–400)
RBC: 3.9 MIL/uL (ref 3.87–5.11)
RDW: 16.1 % — ABNORMAL HIGH (ref 11.5–15.5)
WBC: 6.5 10*3/uL (ref 4.0–10.5)
nRBC: 0 % (ref 0.0–0.2)

## 2023-01-18 LAB — TROPONIN I (HIGH SENSITIVITY)
Troponin I (High Sensitivity): 4 ng/L (ref <18)
Troponin I (High Sensitivity): 3 ng/L (ref <18)

## 2023-01-18 LAB — D-DIMER, QUANTITATIVE: D-Dimer, Quant: 0.28 ug/mL-FEU (ref 0.00–0.50)

## 2023-01-18 MED ORDER — ONDANSETRON HCL 4 MG/2ML IJ SOLN
4.0000 mg | Freq: Once | INTRAMUSCULAR | Status: AC
  Administered 2023-01-18: 4 mg via INTRAVENOUS
  Filled 2023-01-18: qty 2

## 2023-01-18 MED ORDER — OXYCODONE-ACETAMINOPHEN 5-325 MG PO TABS
1.0000 | ORAL_TABLET | Freq: Once | ORAL | Status: AC
  Administered 2023-01-18: 1 via ORAL
  Filled 2023-01-18: qty 1

## 2023-01-18 MED ORDER — MORPHINE SULFATE (PF) 4 MG/ML IV SOLN
4.0000 mg | Freq: Once | INTRAVENOUS | Status: AC
  Administered 2023-01-18: 4 mg via INTRAVENOUS
  Filled 2023-01-18: qty 1

## 2023-01-18 MED ORDER — ONDANSETRON 4 MG PO TBDP
4.0000 mg | ORAL_TABLET | Freq: Once | ORAL | Status: AC
  Administered 2023-01-18: 4 mg via ORAL
  Filled 2023-01-18: qty 1

## 2023-01-18 NOTE — Discharge Instructions (Addendum)
We evaluated you for your chest pain.  Your cardiac enzymes were negative and we did not see signs of a blood clot.  We discussed your case with the cardiologist Dr. Orson Aloe who recommends that you are admitted for observation.  You preferred to go home against medical advice.  We have placed a referral for cardiology so you can see them soon as possible.  Please keep a close eye on your symptoms at home.  If any of your symptoms worsen or you develop new symptoms such as fainting or lightheadedness, severe pain, difficulty breathing, vomiting, or any other concerning symptoms, please return to the emergency department immediately.  You can return to the emergency department at any time and we will reassess your symptoms.

## 2023-01-18 NOTE — ED Triage Notes (Signed)
Pt reports sudden onset of L sided CP that is described as a pressure with radiation into the jaw and L arm. Pt reports associated ShOB, nausea, and diaphoresis. Pt with hx of AMI and CAD.

## 2023-01-18 NOTE — ED Notes (Signed)
Pt reports reporting her pain 8/10 prior to medication, reports the pain decreased to 4/10 however has increased to 7/10 again.  Pt also reports feeling nauseous again.  EDP made aware.

## 2023-01-18 NOTE — ED Provider Notes (Signed)
EMERGENCY DEPARTMENT AT [facility] Provider Note  CSN: [id number] Arrival date & time: 01/18/23 1559  Chief Complaint(s) No chief complaint on file.  HPI [name] is a 62 y.o. female history of bipolar disorder, A-fib on chronic anticoagulation, diabetes, hyperlipidemia, hypertension, prior stent with coronary artery disease presenting to the emergency department with left-sided chest pain.  She reports the chest pain began around 3 PM.  Reports it radiates to her jaw and her left arm.  Does not radiate to the back.  She reports some nausea, no vomiting.  No fevers or chills.  No cough.  She did have some diaphoresis with the pain.  Reports the pain is improved.  No abdominal pain.  No lightheadedness or dizziness or fainting.  She reports she has had a prior heart attack but she did not have chest pain with that so she reports this does not feel the same.  She does report some left leg cramping yesterday and reports history of DVT.  She reports compliance with her anticoagulation.   Past Medical History Past Medical History:  Diagnosis Date   Asthma    Atrial fibrillation (HCC)    Bipolar 1 disorder (HCC)    Chronic back pain    Chronic chest pain    Diabetes mellitus without complication (HCC)    Gastroparesis    GERD (gastroesophageal reflux disease)    Hyperlipemia    Hypertension    IBS (irritable bowel syndrome)    Normal cardiac stress test 02/2014   [facility]   Patient Active Problem List   Diagnosis Date Noted   BRBPR (bright red blood per rectum) 04/07/2012   GASTRIC POLYP 10/21/2006   HYPERLIPIDEMIA 10/21/2006   DISORDER, BIPOLAR NOS 10/21/2006   DEPRESSION 10/21/2006   MIGRAINE HEADACHE 10/21/2006   HYPERTENSION 10/21/2006   CORONARY ARTERY DISEASE 10/21/2006   GERD 10/21/2006   LOW BACK PAIN 10/21/2006   Home Medication(s) Prior to Admission medications   Medication Sig Start Date End Date Taking? Authorizing Provider   acetaminophen (TYLENOL) 325 MG tablet Take by mouth. 05/01/21  Yes [provider]  albuterol (PROAIR HFA) 108 (90 BASE) MCG/ACT inhaler Inhale 1-2 puffs into the lungs every 6 (six) hours as needed for wheezing or shortness of breath.   Yes [provider]  apixaban (ELIQUIS) 5 MG TABS tablet Take 1 tablet (5 mg total) by mouth 2 (two) times daily. 11/17/17  Yes Bethann Berkshire, MD  atorvastatin (LIPITOR) 40 MG tablet Take 40 mg by mouth every morning.   Yes [provider]  carvedilol (COREG) 25 MG tablet Take 25 mg by mouth daily.    Yes [provider]  clopidogrel (PLAVIX) 75 MG tablet Take 75 mg by mouth daily.   Yes [provider]  diphenhydrAMINE (BANOPHEN) 25 mg capsule Take 25 mg by mouth at bedtime as needed for sleep. 04/25/21  Yes [provider]  gabapentin (NEURONTIN) 300 MG capsule Take 300 mg by mouth 3 (three) times daily.   Yes [provider]  hydrOXYzine (ATARAX) 25 MG tablet Take 1 tablet (25 mg total) by mouth every 6 (six) hours as needed for anxiety. 10/07/22  Yes Sabas Sous, MD  insulin detemir (LEVEMIR) 100 UNIT/ML injection Inject 50 Units into the skin 2 (two) times daily.   Yes [provider]  lamoTRIgine (LAMICTAL) 100 MG tablet Take 100 mg by mouth 2 (two) times daily. 12/19/22  Yes [provider]  NOVOLOG FLEXPEN 100  UNIT/ML FlexPen Inject 20 Units into the skin 4 (four) times daily.  05/13/15  Yes [provider]  ondansetron (ZOFRAN) 8 MG tablet Take 8 mg by mouth every 8 (eight) hours as needed for nausea or vomiting.  05/22/18  Yes [provider]  ondansetron (ZOFRAN-ODT) 4 MG disintegrating tablet Take 8 mg by mouth 2 (two) times daily as needed for vomiting or nausea. 11/09/21  Yes [provider]  sertraline (ZOLOFT) 100 MG tablet Take 100 mg by mouth daily.   Yes [provider]                                                                                                                                     Past Surgical History Past Surgical History:  Procedure Laterality Date   ABDOMINAL HYSTERECTOMY  2001   APPENDECTOMY  10/2005   CHOLECYSTECTOMY  10/2005   COLONOSCOPY  2007   Patel ([name])-pt reports hemorrhoids   ESOPHAGOGASTRODUODENOSCOPY  02/28/2006   [name], normal on daily PPI, myultiple hyperplastic polyps   MOUTH SURGERY     RIGHT OOPHORECTOMY  1999   Family History Family History  Problem Relation Age of Onset   Cirrhosis Mother 98       ?etiology   Cirrhosis Father        ?etiology   Cirrhosis Sister 30       ?etiology   Diverticulitis Sister     Social History Social History   Tobacco Use   Smoking status: Passive Smoke Exposure - Never Smoker   Smokeless tobacco: Never  Vaping Use   Vaping Use: Never used  Substance Use Topics   Alcohol use: No   Drug use: No   Allergies Solu-medrol [methylprednisolone sodium succ], Contrast media [iodinated contrast media], Doxycycline, Ibuprofen, Keflex [cephalexin], Metformin and related, Nitroglycerin, Nsaids, Shellfish allergy, Sulfa antibiotics, Toradol [ketorolac tromethamine], Bentyl [dicyclomine hcl], Blueberry flavor, Nitrofurantoin monohyd macro, and Strawberry flavor  Review of Systems Review of Systems  All other systems reviewed and are negative.   Physical Exam Vital Signs  I have reviewed the triage vital signs BP (!) 166/59   Pulse 70   Temp 98.2 F (36.8 C) (Oral)   Resp 16   Ht 5\' 3"  (1.6 m)   Wt 111.1 kg   SpO2 98%   BMI 43.40 kg/m  Physical Exam Vitals and nursing note reviewed.  Constitutional:      General: She is not in acute distress.    Appearance: She is well-developed.  HENT:     Head: Normocephalic and atraumatic.     Mouth/Throat:     Mouth: Mucous membranes are moist.  Eyes:     Pupils: Pupils are equal, round, and reactive to light.  Cardiovascular:     Rate and Rhythm: Normal rate and regular rhythm.      Heart sounds: No murmur heard. Pulmonary:     Effort: Pulmonary effort is  normal. No respiratory distress.     Breath sounds: Normal breath sounds.  Abdominal:     General: Abdomen is flat.     Palpations: Abdomen is soft.     Tenderness: There is no abdominal tenderness.  Musculoskeletal:        General: No tenderness.     Right lower leg: No edema.     Left lower leg: No edema.  Skin:    General: Skin is warm and dry.  Neurological:     General: No focal deficit present.     Mental Status: She is alert. Mental status is at baseline.  Psychiatric:        Mood and Affect: Mood normal.        Behavior: Behavior normal.     ED Results and Treatments Labs (all labs ordered are listed, but only abnormal results are displayed) Labs Reviewed  BASIC METABOLIC PANEL - Abnormal; Notable for the following components:      Result Value   Sodium 132 (*)    Glucose, Bld 229 (*)    Creatinine, Ser 1.24 (*)    Calcium 8.8 (*)    GFR, Estimated 50 (*)    All other components within normal limits  CBC - Abnormal; Notable for the following components:   Hemoglobin 10.5 (*)    HCT 33.2 (*)    RDW 16.1 (*)    All other components within normal limits  D-DIMER, QUANTITATIVE  TROPONIN I (HIGH SENSITIVITY)  TROPONIN I (HIGH SENSITIVITY)                                                                                                                          Radiology DG Chest Port 1 View  Result Date: 01/18/2023 CLINICAL DATA:  [id number] Chest pain [id number] EXAM: PORTABLE CHEST 1 VIEW COMPARISON:  November 10, 2022 FINDINGS: The cardiomediastinal silhouette is unchanged in contour. No pleural effusion. No pneumothorax. No acute pleuroparenchymal abnormality. IMPRESSION: No acute cardiopulmonary abnormality. Electronically Signed   By: Meda Klinefelter M.D.   On: 01/18/2023 16:40    Pertinent labs & imaging results that were available during my care of the patient were reviewed by me and  considered in my medical decision making (see MDM for details).  Medications Ordered in ED Medications  ondansetron (ZOFRAN) injection 4 mg (4 mg Intravenous Given 01/18/23 1710)  morphine (PF) 4 MG/ML injection 4 mg (4 mg Intravenous Given 01/18/23 1710)  oxyCODONE-acetaminophen (PERCOCET/ROXICET) 5-325 MG per tablet 1 tablet (1 tablet Oral Given 01/18/23 1947)  ondansetron (ZOFRAN-ODT) disintegrating tablet 4 mg (4 mg Oral Given 01/18/23 1947)  Procedures Procedures  (including critical care time)  Medical Decision Making / ED Course   MDM:  62 year old female presenting to the emergency department with chest pain.   Patient is overall well-appearing, vitals notable for some hypertension.  No hypotension, tachycardia.  No hypoxia.  EKG appears similar to previous.  Differential includes ACS, less likely pulmonary embolism given her compliance with her medical therapy, will check D-dimer.  Doubt dissection, pain does not radiate to the back, will evaluate cardiomediastinal silhouette on x-ray.  Doubt pneumothorax, pneumonia but will check chest x-ray.  Doubt esophageal pathology without vomiting.  Will reassess.    Clinical Course as of 01/18/23 2057  Sat Jan 18, 2023  2054 Workup reassuring including negative troponin x 2, negative D-dimer.  Patient was still complaining of some pain.  Given her medical history I discussed this with the on-call cardiologist Dr. Orson Aloe.  He actually recommends that she stay in the hospital for observation although does not think she needs any transfer for catheterization does not think she needs heparin.  I discussed with the patient and she prefers to leave AGAINST MEDICAL ADVICE.  She has capacity to make this decision and understands that she may have complications such as worsening of condition, need for return to the  emergency department, permanent disability or even death.  I have placed a cardiology referral and discussed strict return precautions should any of her symptoms worsen.  Patient reports that she will come back if necessary.  She understands that she can return at any time. [WS]    Clinical Course User Index [WS] Lonell Grandchild, MD     Additional history obtained: -Additional history obtained from spouse -External records from outside source obtained and reviewed including: Chart review including previous notes, labs, imaging, consultation notes including outside records from [place]   Lab Tests: -I ordered, reviewed, and interpreted labs.   The pertinent results include:   Labs Reviewed  BASIC METABOLIC PANEL - Abnormal; Notable for the following components:      Result Value   Sodium 132 (*)    Glucose, Bld 229 (*)    Creatinine, Ser 1.24 (*)    Calcium 8.8 (*)    GFR, Estimated 50 (*)    All other components within normal limits  CBC - Abnormal; Notable for the following components:   Hemoglobin 10.5 (*)    HCT 33.2 (*)    RDW 16.1 (*)    All other components within normal limits  D-DIMER, QUANTITATIVE  TROPONIN I (HIGH SENSITIVITY)  TROPONIN I (HIGH SENSITIVITY)    Notable for negative troponin x2, normal d-dimer  EKG   EKG Interpretation  Date/Time:  Saturday Jan 18 2023 16:10:26 EDT Ventricular Rate:  81 PR Interval:  142 QRS Duration: 98 QT Interval:  412 QTC Calculation: 478 R Axis:   47 Text Interpretation: Normal sinus rhythm Nonspecific ST abnormality Abnormal ECG When compared with ECG of 10-Nov-2022 00:12, No significant change since last tracing Confirmed by Alvino Blood ([id number]) on 01/18/2023 4:12:10 PM         Imaging Studies ordered: I ordered imaging studies including CXR On my interpretation imaging demonstrates stable cardiomediastinal silhouette  I independently visualized and interpreted imaging. I agree with the radiologist  interpretation   Medicines ordered and prescription drug management: Meds ordered this encounter  Medications   ondansetron (ZOFRAN) injection 4 mg   morphine (PF) 4 MG/ML injection 4 mg   oxyCODONE-acetaminophen (PERCOCET/ROXICET) 5-325 MG per tablet 1 tablet  ondansetron (ZOFRAN-ODT) disintegrating tablet 4 mg    -I have reviewed the patients home medicines and have made adjustments as needed   Consultations Obtained: I requested consultation with the cardiologist,  and discussed lab and imaging findings as well as pertinent plan - they recommend: admit for obs   Cardiac Monitoring: The patient was maintained on a cardiac monitor.  I personally viewed and interpreted the cardiac monitored which showed an underlying rhythm of: NSR  Social Determinants of Health:  Diagnosis or treatment significantly limited by social determinants of health: obesity   Reevaluation: After the interventions noted above, I reevaluated the patient and found that their symptoms have improved  Co morbidities that complicate the patient evaluation  Past Medical History:  Diagnosis Date   Asthma    Atrial fibrillation (HCC)    Bipolar 1 disorder (HCC)    Chronic back pain    Chronic chest pain    Diabetes mellitus without complication (HCC)    Gastroparesis    GERD (gastroesophageal reflux disease)    Hyperlipemia    Hypertension    IBS (irritable bowel syndrome)    Normal cardiac stress test 02/2014   [facility]      Dispostion: Disposition decision including need for hospitalization was considered, and patient discharged from emergency department.    Final Clinical Impression(s) / ED Diagnoses Final diagnoses:  Chest pain, unspecified type     This chart was dictated using voice recognition software.  Despite best efforts to proofread,  errors can occur which can change the documentation meaning.    Lonell Grandchild, MD 01/18/23 [phone]

## 2023-01-21 ENCOUNTER — Encounter (HOSPITAL_COMMUNITY): Payer: Self-pay | Admitting: Emergency Medicine

## 2023-01-21 ENCOUNTER — Emergency Department (HOSPITAL_COMMUNITY)
Admission: EM | Admit: 2023-01-21 | Discharge: 2023-01-21 | Disposition: A | Discharge status: Home / Self Care | Payer: Medicaid - Out of State | Attending: Emergency Medicine | Admitting: Emergency Medicine

## 2023-01-21 ENCOUNTER — Other Ambulatory Visit: Payer: Self-pay

## 2023-01-21 ENCOUNTER — Emergency Department (HOSPITAL_COMMUNITY): Payer: Medicaid - Out of State

## 2023-01-21 DIAGNOSIS — Z79899 Other long term (current) drug therapy: Secondary | ICD-10-CM | POA: Insufficient documentation

## 2023-01-21 DIAGNOSIS — I2510 Atherosclerotic heart disease of native coronary artery without angina pectoris: Secondary | ICD-10-CM | POA: Insufficient documentation

## 2023-01-21 DIAGNOSIS — Z7902 Long term (current) use of antithrombotics/antiplatelets: Secondary | ICD-10-CM | POA: Insufficient documentation

## 2023-01-21 DIAGNOSIS — Z7901 Long term (current) use of anticoagulants: Secondary | ICD-10-CM | POA: Diagnosis not present

## 2023-01-21 DIAGNOSIS — I10 Essential (primary) hypertension: Secondary | ICD-10-CM | POA: Insufficient documentation

## 2023-01-21 DIAGNOSIS — I160 Hypertensive urgency: Secondary | ICD-10-CM | POA: Diagnosis not present

## 2023-01-21 DIAGNOSIS — R0789 Other chest pain: Secondary | ICD-10-CM

## 2023-01-21 DIAGNOSIS — R079 Chest pain, unspecified: Secondary | ICD-10-CM | POA: Diagnosis present

## 2023-01-21 LAB — CBC
HCT: 31.3 % — ABNORMAL LOW (ref 36.0–46.0)
Hemoglobin: 9.9 g/dL — ABNORMAL LOW (ref 12.0–15.0)
MCH: 27 pg (ref 26.0–34.0)
MCHC: 31.6 g/dL (ref 30.0–36.0)
MCV: 85.3 fL (ref 80.0–100.0)
Platelets: 179 10*3/uL (ref 150–400)
RBC: 3.67 MIL/uL — ABNORMAL LOW (ref 3.87–5.11)
RDW: 16 % — ABNORMAL HIGH (ref 11.5–15.5)
WBC: 5.4 10*3/uL (ref 4.0–10.5)
nRBC: 0 % (ref 0.0–0.2)

## 2023-01-21 LAB — TROPONIN I (HIGH SENSITIVITY)
Troponin I (High Sensitivity): 3 ng/L (ref <18)
Troponin I (High Sensitivity): 4 ng/L (ref <18)

## 2023-01-21 LAB — BASIC METABOLIC PANEL
Anion gap: 10 (ref 5–15)
BUN: 15 mg/dL (ref 6–20)
CO2: 23 mmol/L (ref 22–32)
Calcium: 8.6 mg/dL — ABNORMAL LOW (ref 8.9–10.3)
Chloride: 97 mmol/L — ABNORMAL LOW (ref 98–111)
Creatinine, Ser: 1.27 mg/dL — ABNORMAL HIGH (ref 0.44–1.00)
GFR, Estimated: 48 mL/min — ABNORMAL LOW (ref >60)
Glucose, Bld: 379 mg/dL — ABNORMAL HIGH (ref 70–99)
Potassium: 4.5 mmol/L (ref 3.5–5.1)
Sodium: 130 mmol/L — ABNORMAL LOW (ref 135–145)

## 2023-01-21 LAB — PROTIME-INR
INR: 1.1 (ref 0.8–1.2)
Prothrombin Time: 14.9 seconds (ref 11.4–15.2)

## 2023-01-21 LAB — BRAIN NATRIURETIC PEPTIDE: B Natriuretic Peptide: 54 pg/mL (ref 0.0–100.0)

## 2023-01-21 MED ORDER — CARVEDILOL 12.5 MG PO TABS
25.0000 mg | ORAL_TABLET | Freq: Once | ORAL | Status: DC
  Filled 2023-01-21: qty 2

## 2023-01-21 MED ORDER — ONDANSETRON HCL 4 MG/2ML IJ SOLN
4.0000 mg | Freq: Once | INTRAMUSCULAR | Status: AC
  Administered 2023-01-21: 4 mg via INTRAVENOUS
  Filled 2023-01-21: qty 2

## 2023-01-21 MED ORDER — METOPROLOL TARTRATE 5 MG/5ML IV SOLN
10.0000 mg | Freq: Once | INTRAVENOUS | Status: AC
  Administered 2023-01-21: 10 mg via INTRAVENOUS
  Filled 2023-01-21: qty 10

## 2023-01-21 MED ORDER — CARVEDILOL 25 MG PO TABS: 50.0000 mg | ORAL_TABLET | Freq: Two times a day (BID) | ORAL | 0 refills | Status: DC

## 2023-01-21 MED ORDER — FENTANYL CITRATE PF 50 MCG/ML IJ SOSY
50.0000 ug | PREFILLED_SYRINGE | Freq: Once | INTRAMUSCULAR | Status: AC
  Administered 2023-01-21: 50 ug via INTRAVENOUS
  Filled 2023-01-21: qty 1

## 2023-01-21 MED ORDER — CARVEDILOL 25 MG PO TABS: 25.0000 mg | ORAL_TABLET | Freq: Two times a day (BID) | ORAL | 0 refills | Status: DC

## 2023-01-21 NOTE — ED Notes (Signed)
Patient transported to x-ray. ?

## 2023-01-21 NOTE — Discharge Instructions (Addendum)
Today's evaluation has been generally reassuring.  With your ongoing episodic chest pain, leg pain, and blood pressure issues it is importantly follow-up with our cardiology colleagues.  A referral has been placed on your behalf and the office should contact you in the coming days.    Your blood pressure medication regimen has been adjusted, please take the Coreg, 25 mg, twice daily, with meal.  Discuss this with your cardiologist.  Return here for concerning changes in your condition.

## 2023-01-21 NOTE — ED Triage Notes (Signed)
Pt via POV c/o chest pain radiating to neck and shoulders since Saturday ; she was evaluated at that time but left AMA. She is now having difficulty walking and feels lightheaded. HTN in triage 183/108

## 2023-01-21 NOTE — ED Provider Notes (Signed)
[place] EMERGENCY DEPARTMENT AT [facility] Provider Note   CSN: [id number] Arrival date & time: 01/21/23  1157     History  Chief Complaint  Patient presents with   Chest Pain    [name] is a 62 y.o. female.  HPI Presents 2 days after being seen and evaluated here now with concern for ongoing episodic chest pain, lower extremity pain.  Patient notes a history of CAD, hypertension, moved here from [place] a few months ago.  She was seen, evaluated here 2 days ago.  Recommendation was for admission though her initial labs were reassuring. She left AGAINST MEDICAL ADVICE, returns today with pain that is now right-sided rather than left-sided in the upper chest, no new weakness in any extremity, no loss of sensation in any extremity.  She continues to have pain in her lower extremities.     Home Medications Prior to Admission medications   Medication Sig Start Date End Date Taking? Authorizing Provider  acetaminophen (TYLENOL) 325 MG tablet Take by mouth. 05/01/21   [provider]  albuterol (PROAIR HFA) 108 (90 BASE) MCG/ACT inhaler Inhale 1-2 puffs into the lungs every 6 (six) hours as needed for wheezing or shortness of breath.    [provider]  apixaban (ELIQUIS) 5 MG TABS tablet Take 1 tablet (5 mg total) by mouth 2 (two) times daily. 11/17/17   Bethann Berkshire, MD  atorvastatin (LIPITOR) 40 MG tablet Take 40 mg by mouth every morning.    [provider]  carvedilol (COREG) 25 MG tablet Take 1 tablet (25 mg total) by mouth 2 (two) times daily with a meal. 01/21/23   Gerhard Munch, MD  clopidogrel (PLAVIX) 75 MG tablet Take 75 mg by mouth daily.    [provider]  diphenhydrAMINE (BANOPHEN) 25 mg capsule Take 25 mg by mouth at bedtime as needed for sleep. 04/25/21   [provider]  gabapentin (NEURONTIN) 300 MG capsule Take 300 mg by mouth 3 (three) times daily.    [provider]  hydrOXYzine (ATARAX) 25 MG  tablet Take 1 tablet (25 mg total) by mouth every 6 (six) hours as needed for anxiety. 10/07/22   Sabas Sous, MD  insulin detemir (LEVEMIR) 100 UNIT/ML injection Inject 50 Units into the skin 2 (two) times daily.    [provider]  lamoTRIgine (LAMICTAL) 100 MG tablet Take 100 mg by mouth 2 (two) times daily. 12/19/22   [provider]  NOVOLOG FLEXPEN 100 UNIT/ML FlexPen Inject 20 Units into the skin 4 (four) times daily.  05/13/15   [provider]  ondansetron (ZOFRAN) 8 MG tablet Take 8 mg by mouth every 8 (eight) hours as needed for nausea or vomiting.  05/22/18   [provider]  ondansetron (ZOFRAN-ODT) 4 MG disintegrating tablet Take 8 mg by mouth 2 (two) times daily as needed for vomiting or nausea. 11/09/21   [provider]  sertraline (ZOLOFT) 100 MG tablet Take 100 mg by mouth daily.    [provider]      Allergies    Solu-medrol [methylprednisolone sodium succ], Contrast media [iodinated contrast media], Doxycycline, Ibuprofen, Keflex [cephalexin], Metformin and related, Nitroglycerin, Nsaids, Shellfish allergy, Sulfa antibiotics, Toradol [ketorolac tromethamine], Bentyl [dicyclomine hcl], Blueberry flavor, Nitrofurantoin monohyd macro, and Strawberry flavor    Review of Systems   Review of Systems  All other systems reviewed and are negative.   Physical Exam Updated Vital Signs BP (!) 162/60 (BP Location: Right Arm)  Pulse 65   Temp 98.4 F (36.9 C) (Oral)   Resp 17   Ht 5\' 3"  (1.6 m)   Wt 111.1 kg   SpO2 99%   BMI 43.40 kg/m  Physical Exam Vitals and nursing note reviewed.  Constitutional:      General: She is not in acute distress.    Appearance: She is well-developed. She is obese. She is not ill-appearing, toxic-appearing or diaphoretic.  HENT:     Head: Normocephalic and atraumatic.  Eyes:     Conjunctiva/sclera: Conjunctivae normal.  Cardiovascular:     Rate and Rhythm: Normal rate and regular rhythm.   Pulmonary:     Effort: Pulmonary effort is normal. No respiratory distress.     Breath sounds: Normal breath sounds. No stridor.  Abdominal:     General: There is no distension.  Skin:    General: Skin is warm and dry.  Neurological:     Mental Status: She is alert and oriented to person, place, and time.     Cranial Nerves: No cranial nerve deficit.  Psychiatric:        Mood and Affect: Mood normal.     ED Results / Procedures / Treatments   Labs (all labs ordered are listed, but only abnormal results are displayed) Labs Reviewed  BASIC METABOLIC PANEL - Abnormal; Notable for the following components:      Result Value   Sodium 130 (*)    Chloride 97 (*)    Glucose, Bld 379 (*)    Creatinine, Ser 1.27 (*)    Calcium 8.6 (*)    GFR, Estimated 48 (*)    All other components within normal limits  CBC - Abnormal; Notable for the following components:   RBC 3.67 (*)    Hemoglobin 9.9 (*)    HCT 31.3 (*)    RDW 16.0 (*)    All other components within normal limits  PROTIME-INR  BRAIN NATRIURETIC PEPTIDE  TROPONIN I (HIGH SENSITIVITY)  TROPONIN I (HIGH SENSITIVITY)    EKG EKG Interpretation  Date/Time:  Tuesday Jan 21 2023 12:20:27 EDT Ventricular Rate:  80 PR Interval:  142 QRS Duration: 94 QT Interval:  396 QTC Calculation: 456 R Axis:   59 Text Interpretation: Normal sinus rhythm Nonspecific ST abnormality Abnormal ECG Confirmed by Gerhard Munch [phone]) on 01/21/2023 1:02:54 PM  Radiology DG Chest 2 View  Result Date: 01/21/2023 CLINICAL DATA:  Chest pain EXAM: CHEST - 2 VIEW COMPARISON:  X-ray 01/18/2023 FINDINGS: No consolidation, pneumothorax or effusion. Normal cardiopericardial silhouette without edema. Degenerative changes of the spine. IMPRESSION: No acute cardiopulmonary disease Electronically Signed   By: Karen Kays M.D.   On: 01/21/2023 13:04    Procedures Procedures    Medications Ordered in ED Medications  metoprolol tartrate (LOPRESSOR)  injection 10 mg (10 mg Intravenous Given 01/21/23 1359)  fentaNYL (SUBLIMAZE) injection 50 mcg (50 mcg Intravenous Given 01/21/23 1437)  ondansetron (ZOFRAN) injection 4 mg (4 mg Intravenous Given 01/21/23 1437)    ED Course/ Medical Decision Making/ A&P                             Medical Decision Making Adult female with history of multiple medical issues including MI, hypertension, obesity, bipolar disorder presents with ongoing thoracic pain, lower extremity pain.  Given her substantial risk profile, differential includes ACS, pneumonia, bacteremia, sepsis, heart failure, COPD.  Reviewed the patient's chart, including documentation from 2 days ago,  and imaging studies from earlier this year negative for dissection. After initial evaluation the patient received continuous cardiac monitoring, pulse oximetry.  Cardiac 75 sinus normal Pulse ox 100% room air normal Patient did have elevated blood pressure, received Lopressor as well as her home medication.  Amount and/or Complexity of Data Reviewed Independent Historian:     Details: Daughter at bedside External Data Reviewed: notes.    Details: Notes from 2 days ago, and 4 prior ED visits over the past 6 months reviewed Labs: ordered. Decision-making details documented in ED Course. Radiology: ordered and independent interpretation performed. Decision-making details documented in ED Course. ECG/medicine tests: ordered and independent interpretation performed. Decision-making details documented in ED Course.  Risk Prescription drug management. Decision regarding hospitalization.   3:08 PM Patient ambulatory.  She is accompanied by her daughter, who assists with history now.  This adult female with multiple medical issues, relatively recent transplant from [place] presents with chest pain second time in 3 days.  Those results as well as today's reassuring, little evidence for ACS with for normal troponin, nonischemic EKG though she did have T  wave inversions 2 days ago. Patient's presentation was concerning due to hypertension as well, but absent posterior pain, neurocomplaints, asymmetric pulses and with reassuring dissection study earlier this year there is low suspicion for that currently. Patient has no hypoxia, increased work of breathing, little evidence for PE and she is on Eliquis given her history of A-fib. Given today's reassuring findings, blood pressure medications were increased, patient had referral to cardiology and she was discharged in stable condition.         Final Clinical Impression(s) / ED Diagnoses Final diagnoses:  Atypical chest pain  Hypertensive urgency    Rx / DC Orders ED Discharge Orders          Ordered    Ambulatory referral to Cardiology       Comments: If you have not heard from the Cardiology office within the next 72 hours please call [phone].   01/21/23 1507           carvedilol (COREG) 25 MG tablet  2 times daily with meals        01/21/23 1507              Gerhard Munch, MD 01/21/23 [phone]

## 2023-02-08 ENCOUNTER — Emergency Department (HOSPITAL_COMMUNITY): Payer: Medicaid - Out of State

## 2023-02-08 ENCOUNTER — Observation Stay (HOSPITAL_COMMUNITY)
Admission: EM | Admit: 2023-02-08 | Discharge: 2023-02-08 | Disposition: A | Discharge status: Home / Self Care | Payer: Medicaid - Out of State | Attending: Internal Medicine | Admitting: Internal Medicine

## 2023-02-08 ENCOUNTER — Encounter (HOSPITAL_COMMUNITY): Payer: Self-pay | Admitting: *Deleted

## 2023-02-08 ENCOUNTER — Other Ambulatory Visit: Payer: Self-pay

## 2023-02-08 DIAGNOSIS — J45909 Unspecified asthma, uncomplicated: Secondary | ICD-10-CM | POA: Insufficient documentation

## 2023-02-08 DIAGNOSIS — I4891 Unspecified atrial fibrillation: Secondary | ICD-10-CM | POA: Insufficient documentation

## 2023-02-08 DIAGNOSIS — Z79899 Other long term (current) drug therapy: Secondary | ICD-10-CM | POA: Diagnosis not present

## 2023-02-08 DIAGNOSIS — E119 Type 2 diabetes mellitus without complications: Secondary | ICD-10-CM | POA: Insufficient documentation

## 2023-02-08 DIAGNOSIS — R079 Chest pain, unspecified: Secondary | ICD-10-CM | POA: Diagnosis not present

## 2023-02-08 DIAGNOSIS — Z794 Long term (current) use of insulin: Secondary | ICD-10-CM | POA: Insufficient documentation

## 2023-02-08 DIAGNOSIS — Z955 Presence of coronary angioplasty implant and graft: Secondary | ICD-10-CM | POA: Diagnosis not present

## 2023-02-08 DIAGNOSIS — I10 Essential (primary) hypertension: Secondary | ICD-10-CM | POA: Insufficient documentation

## 2023-02-08 DIAGNOSIS — R531 Weakness: Secondary | ICD-10-CM | POA: Diagnosis not present

## 2023-02-08 DIAGNOSIS — Z7902 Long term (current) use of antithrombotics/antiplatelets: Secondary | ICD-10-CM | POA: Diagnosis not present

## 2023-02-08 DIAGNOSIS — Z7901 Long term (current) use of anticoagulants: Secondary | ICD-10-CM | POA: Insufficient documentation

## 2023-02-08 DIAGNOSIS — I2510 Atherosclerotic heart disease of native coronary artery without angina pectoris: Secondary | ICD-10-CM | POA: Insufficient documentation

## 2023-02-08 LAB — TROPONIN I (HIGH SENSITIVITY)
Troponin I (High Sensitivity): 5 ng/L (ref <18)
Troponin I (High Sensitivity): 6 ng/L (ref <18)

## 2023-02-08 LAB — CBC
HCT: 32.3 % — ABNORMAL LOW (ref 36.0–46.0)
Hemoglobin: 10.2 g/dL — ABNORMAL LOW (ref 12.0–15.0)
MCH: 26.8 pg (ref 26.0–34.0)
MCHC: 31.6 g/dL (ref 30.0–36.0)
MCV: 85 fL (ref 80.0–100.0)
Platelets: 185 10*3/uL (ref 150–400)
RBC: 3.8 MIL/uL — ABNORMAL LOW (ref 3.87–5.11)
RDW: 16.4 % — ABNORMAL HIGH (ref 11.5–15.5)
WBC: 5.6 10*3/uL (ref 4.0–10.5)
nRBC: 0 % (ref 0.0–0.2)

## 2023-02-08 LAB — BASIC METABOLIC PANEL
Anion gap: 12 (ref 5–15)
BUN: 22 mg/dL — ABNORMAL HIGH (ref 6–20)
CO2: 21 mmol/L — ABNORMAL LOW (ref 22–32)
Calcium: 8.8 mg/dL — ABNORMAL LOW (ref 8.9–10.3)
Chloride: 101 mmol/L (ref 98–111)
Creatinine, Ser: 1.41 mg/dL — ABNORMAL HIGH (ref 0.44–1.00)
GFR, Estimated: 43 mL/min — ABNORMAL LOW (ref >60)
Glucose, Bld: 233 mg/dL — ABNORMAL HIGH (ref 70–99)
Potassium: 4.2 mmol/L (ref 3.5–5.1)
Sodium: 134 mmol/L — ABNORMAL LOW (ref 135–145)

## 2023-02-08 MED ORDER — PROCHLORPERAZINE EDISYLATE 10 MG/2ML IJ SOLN
5.0000 mg | Freq: Once | INTRAMUSCULAR | Status: AC
  Administered 2023-02-08: 5 mg via INTRAVENOUS
  Filled 2023-02-08: qty 2

## 2023-02-08 NOTE — Discharge Instructions (Signed)
Follow-up with your doctors in [place].  Your workup was reassuring overall but you do have the potential weakness and continued chest pain.

## 2023-02-08 NOTE — ED Notes (Signed)
Pt family to nurses station- asking for ice and pain meds for pt- pt given ice- Dr Rubin Payor made aware of request for pain meds

## 2023-02-08 NOTE — Consult Note (Signed)
Initial Consultation Note   Patient: [name] WUJ:[id number] DOB: 01/11/1961 PCP: [facility] DOA: 02/08/2023 DOS: the patient was seen and examined on 02/08/2023 Primary service: Onnie Boer, MD  Referring physician: Dr. Rubin Payor  Reason for consult: Left sided weakness  Assessment/Plan:  Chest pains- chronic, intermittent, atypical.  History of coronary artery disease, with stent placement 2022.  She reports compliance with Eliquis, Plavix, statins.  She is also on carvedilol.  Has not been following up with her cardiologist.  Chest pain today resolved in the ED after comparison.  EKG unchanged, troponin 5 > 6.  Chest pain may be GI related, versus musculoskeletal, but she needs to follow-up with cardiology. -I have emphasized to patient that she needs to follow-up with a cardiologist, she tells me she will call her previous cardiologist Dr. Earna Coder at [facility] and schedule appointment. -Continue home medications  Left-sided weakness- subjective.  No focal neurologic deficits appreciated on exam.  5/5 strength in bilateral upper and lower extremities, with intact sensation globally.  Hx of atrial fibrillation, EKG currently shows sinus rhythm.  She is on optimal medical therapy anyway with Eliquis, Plavix, Lipitor, and she reports compliance.  Head CT negative for acute abnormality.  At baseline she sometimes ambulates with a cane. -MRI not available at [facility], patient declined admission for MRI or admission to [facility]. She request to go home.  -She tells me she will call her outpatient provider to schedule an outpatient MRI. -Continue home medications   HPI: [name] is a 62 y.o. female with past medical history of coronary artery disease, diabetes mellitus, atrial fibrillation, asthma, depression, hypertension. Patient presented to the ED with complaints of right sided chest pain, and 1 episode of vomiting that occurred this morning.  Patient  has a history of chronic chest pains, she has been to the ED multiple times, both here and at [facility]- per care everywhere over the past years.  She comes in today with the same chest pains, sometimes right-sided and sometimes left-sided.  Chest pain has since resolved Compazine given in the ED.  Patient does not follow-up with a cardiologist as outpatient.  Patient reports 2 years ago when she had cardiac stent placed, she had weakness and numbness to her left lower extremity that resolved after the stent was placed.   She reports onset of weakness to her left upper and lower extremity that started this morning with chest pain.  No facial asymmetry, no voice changes.  Review of Systems: As mentioned in the history of present illness. All other systems reviewed and are negative.  Past Medical History:  Diagnosis Date   Asthma    Atrial fibrillation (HCC)    Bipolar 1 disorder (HCC)    Chronic back pain    Chronic chest pain    Diabetes mellitus without complication (HCC)    Gastroparesis    GERD (gastroesophageal reflux disease)    Hyperlipemia    Hypertension    IBS (irritable bowel syndrome)    Normal cardiac stress test 02/2014   [facility]   Past Surgical History:  Procedure Laterality Date   ABDOMINAL HYSTERECTOMY  2001   APPENDECTOMY  10/2005   CHOLECYSTECTOMY  10/2005   COLONOSCOPY  2007   Patel ([name])-pt reports hemorrhoids   ESOPHAGOGASTRODUODENOSCOPY  02/28/2006   [name], normal on daily PPI, myultiple hyperplastic polyps   MOUTH SURGERY     RIGHT OOPHORECTOMY  1999   Social History:  reports that she  is a non-smoker but has been exposed to tobacco smoke. She has never used smokeless tobacco. She reports that she does not drink alcohol and does not use drugs.  Allergies  Allergen Reactions   Solu-Medrol [Methylprednisolone Sodium Succ] Shortness Of Breath   Contrast Media [Iodinated Contrast Media] Itching and Swelling   Doxycycline Hives   Ibuprofen  Other (See Comments)    GI upset   Keflex [Cephalexin] Nausea And Vomiting   Metformin And Related Nausea Only   Nitroglycerin Nausea And Vomiting   Nsaids Other (See Comments)    Stomach pain/bleeding   Shellfish Allergy Itching and Swelling   Sulfa Antibiotics Nausea And Vomiting   Toradol [Ketorolac Tromethamine] Hives   Bentyl [Dicyclomine Hcl] Anxiety   Blueberry Flavor Nausea And Vomiting and Rash   Nitrofurantoin Monohyd Macro Itching   Strawberry Flavor Nausea And Vomiting and Rash    Family History  Problem Relation Age of Onset   Cirrhosis Mother 61       ?etiology   Cirrhosis Father        ?etiology   Cirrhosis Sister 30       ?etiology   Diverticulitis Sister     Prior to Admission medications   Medication Sig Start Date End Date Taking? Authorizing Provider  acetaminophen (TYLENOL) 325 MG tablet Take by mouth. 05/01/21   [provider]  albuterol (PROAIR HFA) 108 (90 BASE) MCG/ACT inhaler Inhale 1-2 puffs into the lungs every 6 (six) hours as needed for wheezing or shortness of breath.    [provider]  apixaban (ELIQUIS) 5 MG TABS tablet Take 1 tablet (5 mg total) by mouth 2 (two) times daily. 11/17/17   Bethann Berkshire, MD  atorvastatin (LIPITOR) 40 MG tablet Take 40 mg by mouth every morning.    [provider]  carvedilol (COREG) 25 MG tablet Take 1 tablet (25 mg total) by mouth 2 (two) times daily with a meal. 01/21/23   Gerhard Munch, MD  clopidogrel (PLAVIX) 75 MG tablet Take 75 mg by mouth daily.    [provider]  diphenhydrAMINE (BANOPHEN) 25 mg capsule Take 25 mg by mouth at bedtime as needed for sleep. 04/25/21   [provider]  gabapentin (NEURONTIN) 300 MG capsule Take 300 mg by mouth 3 (three) times daily.    [provider]  hydrOXYzine (ATARAX) 25 MG tablet Take 1 tablet (25 mg total) by mouth every 6 (six) hours as needed for anxiety. 10/07/22   Sabas Sous, MD  insulin detemir (LEVEMIR) 100  UNIT/ML injection Inject 50 Units into the skin 2 (two) times daily.    [provider]  lamoTRIgine (LAMICTAL) 100 MG tablet Take 100 mg by mouth 2 (two) times daily. 12/19/22   [provider]  NOVOLOG FLEXPEN 100 UNIT/ML FlexPen Inject 20 Units into the skin 4 (four) times daily.  05/13/15   [provider]  ondansetron (ZOFRAN) 8 MG tablet Take 8 mg by mouth every 8 (eight) hours as needed for nausea or vomiting.  05/22/18   [provider]  ondansetron (ZOFRAN-ODT) 4 MG disintegrating tablet Take 8 mg by mouth 2 (two) times daily as needed for vomiting or nausea. 11/09/21   [provider]  sertraline (ZOLOFT) 100 MG tablet Take 100 mg by mouth daily.    [provider]    Physical Exam: Vitals:   02/08/23 1517 02/08/23 1530 02/08/23 1600 02/08/23 1630  BP: (!) 178/81 (!) 171/75 (!) 149/87 (!) 168/71  Pulse:  88 87 85 76  Resp: 20 14 18 18   Temp: 98.2 F (36.8 C)     TempSrc: Oral     SpO2: 100% 99% 100% 97%  Weight:      Height:        Constitutional: NAD, calm, comfortable Eyes: PERRL, lids and conjunctivae normal ENMT: Mucous membranes are moist.  Neck: normal, supple, no masses, no thyromegaly Respiratory: clear to auscultation bilaterally, no wheezing, no crackles. Normal respiratory effort. No accessory muscle use.  Cardiovascular: Regular rate and rhythm, no murmurs / rubs / gallops. No extremity edema.  Extremities warm Abdomen: obese, no tenderness, no masses palpated. No hepatosplenomegaly. Bowel sounds positive.  Musculoskeletal: no clubbing / cyanosis. No joint deformity upper and lower extremities.  Skin: no rashes, lesions, ulcers. No induration Neurologic:  Neurological:     Mental Status: She is alert.     GCS: GCS eye subscore is 4. GCS verbal subscore is 5. GCS motor subscore is 6.     Comments: Mental Status:  Alert, oriented, thought content appropriate, able to give a coherent history. Speech fluent without  evidence of aphasia. Able to follow 2 step commands without difficulty.  Cranial Nerves:  II:  Peripheral visual fields grossly normal, pupils equal, round, reactive to light III,IV, VI: ptosis not present, extra-ocular motions intact bilaterally  V,VII: smile symmetric, eyebrows raise symmetric, facial light touch sensation equal VIII: hearing grossly normal to voice  XII: midline tongue extension without fassiculations Motor:  Motor - Normal tone.  5/5 strength in upper and lower extremities bilaterally Sensory: Sensation intact to light touch in all extremities.  Cerebellar: No pronator drift.  CV: distal pulses palpable throughout    Psychiatric: Normal judgment and insight. Alert and oriented x 3. Normal mood.   Data Reviewed:  Troponin 5 > 6.  Creatinine 1.4, close to baseline 1.1-1.2.  Family Communication: Spouse at bedside  Primary team communication: EDP  Thank you very much for involving [place] in the care of your patient.  Author: Onnie Boer, MD 02/08/2023 6:32 PM  For on call review [email or web address].

## 2023-02-08 NOTE — ED Triage Notes (Signed)
Pt with CP started an hour ago.  Pt with left sided weakness this morning at 1000 and with emesis x 1.  + nausea.  Pt is currently on Plavix and Eliquis.  Recent stent to heart and hx of Afib.

## 2023-02-08 NOTE — ED Notes (Signed)
Pt to follow up with cardiology. Pt declined wheelchair and ambulated with steady gait out of ED.

## 2023-02-08 NOTE — ED Provider Notes (Signed)
[place] EMERGENCY DEPARTMENT AT [facility] Provider Note   CSN: [id number] Arrival date & time: 02/08/23  1507     History  Chief Complaint  Patient presents with   Chest Pain   Weakness    [name] is a 62 y.o. female.   Chest Pain Associated symptoms: weakness   Weakness Associated symptoms: chest pain   Patient presents with chest pain and left side numbness and weakness.  Does have a history of chronic chest pain.  Reviewing notes.  2 years ago had a stent in [place].  Since then has continued to get chest pain.  Was also having frequent chest pain before the stent.  States pain that happened today was in the right chest went to the right shoulder.  Still having the pain.  No fevers no cough.  States she has been trying to take it easy.  However also states she has left-sided numbness and weakness.  States developed that this morning and noticed it when she went to get a coffee.  Does have a posterior headache also.  States that she had symptoms like this on the left side when she had her stent 2 years ago.  Told me initially that she has had numbness on the left side since.  States she had vision changes.  States she never told the doctors that she was weak on the left side.  She also later tells me that now she only had the numbness and weakness at that time and has been fine with no symptoms up until today.  States will feel both tingly and weak.  States she previously had to walk with a cane.    Past Medical History:  Diagnosis Date   Asthma    Atrial fibrillation (HCC)    Bipolar 1 disorder (HCC)    Chronic back pain    Chronic chest pain    Diabetes mellitus without complication (HCC)    Gastroparesis    GERD (gastroesophageal reflux disease)    Hyperlipemia    Hypertension    IBS (irritable bowel syndrome)    Normal cardiac stress test 02/2014   [facility] Medications Prior to Admission medications   Medication Sig Start Date End  Date Taking? Authorizing Provider  acetaminophen (TYLENOL) 325 MG tablet Take by mouth. 05/01/21   [provider]  albuterol (PROAIR HFA) 108 (90 BASE) MCG/ACT inhaler Inhale 1-2 puffs into the lungs every 6 (six) hours as needed for wheezing or shortness of breath.    [provider]  apixaban (ELIQUIS) 5 MG TABS tablet Take 1 tablet (5 mg total) by mouth 2 (two) times daily. 11/17/17   Bethann Berkshire, MD  atorvastatin (LIPITOR) 40 MG tablet Take 40 mg by mouth every morning.    [provider]  carvedilol (COREG) 25 MG tablet Take 1 tablet (25 mg total) by mouth 2 (two) times daily with a meal. 01/21/23   Gerhard Munch, MD  clopidogrel (PLAVIX) 75 MG tablet Take 75 mg by mouth daily.    [provider]  diphenhydrAMINE (BANOPHEN) 25 mg capsule Take 25 mg by mouth at bedtime as needed for sleep. 04/25/21   [provider]  gabapentin (NEURONTIN) 300 MG capsule Take 300 mg by mouth 3 (three) times daily.    [provider]  hydrOXYzine (ATARAX) 25 MG tablet Take 1 tablet (25 mg total) by mouth every 6 (six) hours as needed for anxiety. 10/07/22   Kennis Carina  M, MD  insulin detemir (LEVEMIR) 100 UNIT/ML injection Inject 50 Units into the skin 2 (two) times daily.    [provider]  lamoTRIgine (LAMICTAL) 100 MG tablet Take 100 mg by mouth 2 (two) times daily. 12/19/22   [provider]  NOVOLOG FLEXPEN 100 UNIT/ML FlexPen Inject 20 Units into the skin 4 (four) times daily.  05/13/15   [provider]  ondansetron (ZOFRAN) 8 MG tablet Take 8 mg by mouth every 8 (eight) hours as needed for nausea or vomiting.  05/22/18   [provider]  ondansetron (ZOFRAN-ODT) 4 MG disintegrating tablet Take 8 mg by mouth 2 (two) times daily as needed for vomiting or nausea. 11/09/21   [provider]  sertraline (ZOLOFT) 100 MG tablet Take 100 mg by mouth daily.    [provider]      Allergies    Solu-medrol  [methylprednisolone sodium succ], Contrast media [iodinated contrast media], Doxycycline, Ibuprofen, Keflex [cephalexin], Metformin and related, Nitroglycerin, Nsaids, Shellfish allergy, Sulfa antibiotics, Toradol [ketorolac tromethamine], Bentyl [dicyclomine hcl], Blueberry flavor, Nitrofurantoin monohyd macro, and Strawberry flavor    Review of Systems   Review of Systems  Cardiovascular:  Positive for chest pain.  Neurological:  Positive for weakness.    Physical Exam Updated Vital Signs BP (!) 168/71   Pulse 76   Temp 98.2 F (36.8 C) (Oral)   Resp 18   Ht 5\' 3"  (1.6 m)   Wt 111.1 kg   SpO2 97%   BMI 43.40 kg/m  Physical Exam Vitals and nursing note reviewed.  Eyes:     Extraocular Movements: Extraocular movements intact.  Cardiovascular:     Rate and Rhythm: Normal rate and regular rhythm.  Chest:     Chest wall: Tenderness present.  Abdominal:     Tenderness: There is no abdominal tenderness.  Musculoskeletal:     Right lower leg: No tenderness.     Left lower leg: No tenderness.  Neurological:     Mental Status: She is alert.     Comments: Face symmetric.  Eye movements intact.  Patient states paresthesias on upper and lower extremities.  When asked to raise her arms she will hold the left one a little lower, however appears to have full strength when pushing against resistance.  Also states difficulty raising the left leg but does appear to have some strength.     ED Results / Procedures / Treatments   Labs (all labs ordered are listed, but only abnormal results are displayed) Labs Reviewed  BASIC METABOLIC PANEL - Abnormal; Notable for the following components:      Result Value   Sodium 134 (*)    CO2 21 (*)    Glucose, Bld 233 (*)    BUN 22 (*)    Creatinine, Ser 1.41 (*)    Calcium 8.8 (*)    GFR, Estimated 43 (*)    All other components within normal limits  CBC - Abnormal; Notable for the following components:   RBC 3.80 (*)    Hemoglobin 10.2 (*)     HCT 32.3 (*)    RDW 16.4 (*)    All other components within normal limits  TROPONIN I (HIGH SENSITIVITY)  TROPONIN I (HIGH SENSITIVITY)    EKG None  Radiology CT Head Wo Contrast  Result Date: 02/08/2023 CLINICAL DATA:  Acute stroke suspected.  Neural deficit. EXAM: CT HEAD WITHOUT CONTRAST TECHNIQUE: Contiguous axial images were obtained from the base of the skull through the  vertex without intravenous contrast. RADIATION DOSE REDUCTION: This exam was performed according to the departmental dose-optimization program which includes automated exposure control, adjustment of the mA and/or kV according to patient size and/or use of iterative reconstruction technique. COMPARISON:  December 05, 2005 FINDINGS: Brain: No evidence of acute infarction, hemorrhage, hydrocephalus, extra-axial collection or mass lesion/mass effect. Vascular: No hyperdense vessel or unexpected calcification. Skull: Normal. Negative for fracture or focal lesion. Sinuses/Orbits: Post port mucosal thickening of the left maxillary sinus. Other: None. IMPRESSION: 1. No acute intracranial abnormality. 2. Left maxillary sinus disease. Electronically Signed   By: Ted Mcalpine M.D.   On: 02/08/2023 16:33   DG Chest Portable 1 View  Result Date: 02/08/2023 CLINICAL DATA:  Chest pain starting this morning. EXAM: PORTABLE CHEST 1 VIEW COMPARISON:  Chest radiographs 01/21/2023 FINDINGS: Cardiac silhouette and mediastinal contours are within normal limits. The lungs are clear. No pleural effusion or pneumothorax. Moderate multilevel disc space narrowing and endplate osteophytes of the upper thoracic spine. IMPRESSION: No acute cardiopulmonary disease process. Electronically Signed   By: Neita Garnet M.D.   On: 02/08/2023 16:06    Procedures Procedures    Medications Ordered in ED Medications  prochlorperazine (COMPAZINE) injection 5 mg (5 mg Intravenous Given 02/08/23 1730)    ED Course/ Medical Decision Making/ A&P                              Medical Decision Making Amount and/or Complexity of Data Reviewed Labs: ordered. Radiology: ordered.  Risk Prescription drug management.   Patient with chest pain.  Acute on chronic.  EKG reassuring.  Reviewed previous discharge note when she got her cath.  Has had multiple visits with chest pain since.  Recent visits to the ER including 2 earlier this month. However also now stating left-sided weakness.  Somewhat variable exam but does definitely have risk factors.  Will get head CT.  Will get chest x-ray and basic blood work to evaluate for cardiac cause.  Head CT reassuring.  Chest x-ray and blood work overall reassuring.  Creatinine mildly increased.  However with potential weakness on the left side and risk I feel she would benefit from mission to the hospital for further workup.  Will discuss with hospitalist.  Potentially will need transfer to [facility] for availability of MRI.  Discussed with hospitalist for admission.  Hospitalist saw patient.  Patient no longer wants to be admitted.  Wants to follow-up as an outpatient.  Troponin is negative which is overall reassuring.  Has had chronic pain.  Also with the weakness on the left side already is on a statin Plavix and Eliquis.  Not much to do in terms of treatment for that at this point.  Will follow-up as an outpatient.        Final Clinical Impression(s) / ED Diagnoses Final diagnoses:  Weakness  Chest pain, unspecified type    Rx / DC Orders ED Discharge Orders     None         Benjiman Core, MD 02/08/23 1815

## 2023-02-14 ENCOUNTER — Ambulatory Visit: Payer: Medicaid - Out of State | Admitting: Internal Medicine

## 2023-02-14 NOTE — Progress Notes (Signed)
Erroneous encounter - please disregard.

## 2023-03-30 ENCOUNTER — Emergency Department (HOSPITAL_COMMUNITY): Payer: Medicaid - Out of State

## 2023-03-30 ENCOUNTER — Encounter (HOSPITAL_COMMUNITY): Payer: Self-pay | Admitting: Emergency Medicine

## 2023-03-30 ENCOUNTER — Emergency Department (HOSPITAL_COMMUNITY)
Admission: EM | Admit: 2023-03-30 | Discharge: 2023-03-30 | Disposition: A | Discharge status: Home / Self Care | Payer: Medicaid - Out of State | Attending: Emergency Medicine | Admitting: Emergency Medicine

## 2023-03-30 ENCOUNTER — Other Ambulatory Visit: Payer: Self-pay

## 2023-03-30 DIAGNOSIS — R079 Chest pain, unspecified: Secondary | ICD-10-CM

## 2023-03-30 DIAGNOSIS — Z7902 Long term (current) use of antithrombotics/antiplatelets: Secondary | ICD-10-CM | POA: Diagnosis not present

## 2023-03-30 DIAGNOSIS — Z7984 Long term (current) use of oral hypoglycemic drugs: Secondary | ICD-10-CM | POA: Insufficient documentation

## 2023-03-30 DIAGNOSIS — I10 Essential (primary) hypertension: Secondary | ICD-10-CM | POA: Diagnosis not present

## 2023-03-30 DIAGNOSIS — Z794 Long term (current) use of insulin: Secondary | ICD-10-CM | POA: Insufficient documentation

## 2023-03-30 DIAGNOSIS — R0789 Other chest pain: Secondary | ICD-10-CM | POA: Insufficient documentation

## 2023-03-30 DIAGNOSIS — I2510 Atherosclerotic heart disease of native coronary artery without angina pectoris: Secondary | ICD-10-CM | POA: Insufficient documentation

## 2023-03-30 DIAGNOSIS — E119 Type 2 diabetes mellitus without complications: Secondary | ICD-10-CM | POA: Insufficient documentation

## 2023-03-30 DIAGNOSIS — R519 Headache, unspecified: Secondary | ICD-10-CM | POA: Diagnosis not present

## 2023-03-30 DIAGNOSIS — Z7901 Long term (current) use of anticoagulants: Secondary | ICD-10-CM | POA: Diagnosis not present

## 2023-03-30 DIAGNOSIS — R0602 Shortness of breath: Secondary | ICD-10-CM | POA: Insufficient documentation

## 2023-03-30 LAB — PROTIME-INR
INR: 1.1 (ref 0.8–1.2)
Prothrombin Time: 14.3 seconds (ref 11.4–15.2)

## 2023-03-30 LAB — BASIC METABOLIC PANEL
Anion gap: 10 (ref 5–15)
BUN: 15 mg/dL (ref 6–20)
CO2: 22 mmol/L (ref 22–32)
Calcium: 8.1 mg/dL — ABNORMAL LOW (ref 8.9–10.3)
Chloride: 99 mmol/L (ref 98–111)
Creatinine, Ser: 1.49 mg/dL — ABNORMAL HIGH (ref 0.44–1.00)
GFR, Estimated: 40 mL/min — ABNORMAL LOW (ref >60)
Glucose, Bld: 282 mg/dL — ABNORMAL HIGH (ref 70–99)
Potassium: 3.9 mmol/L (ref 3.5–5.1)
Sodium: 131 mmol/L — ABNORMAL LOW (ref 135–145)

## 2023-03-30 LAB — CBC
HCT: 28.1 % — ABNORMAL LOW (ref 36.0–46.0)
Hemoglobin: 9 g/dL — ABNORMAL LOW (ref 12.0–15.0)
MCH: 27.1 pg (ref 26.0–34.0)
MCHC: 32 g/dL (ref 30.0–36.0)
MCV: 84.6 fL (ref 80.0–100.0)
Platelets: 179 10*3/uL (ref 150–400)
RBC: 3.32 MIL/uL — ABNORMAL LOW (ref 3.87–5.11)
RDW: 15.9 % — ABNORMAL HIGH (ref 11.5–15.5)
WBC: 5.4 10*3/uL (ref 4.0–10.5)
nRBC: 0 % (ref 0.0–0.2)

## 2023-03-30 LAB — TROPONIN I (HIGH SENSITIVITY)
Troponin I (High Sensitivity): 4 ng/L (ref <18)
Troponin I (High Sensitivity): 5 ng/L (ref <18)

## 2023-03-30 MED ORDER — ALUM & MAG HYDROXIDE-SIMETH 200-200-20 MG/5ML PO SUSP
30.0000 mL | Freq: Once | ORAL | Status: AC
  Administered 2023-03-30: 30 mL via ORAL
  Filled 2023-03-30: qty 30

## 2023-03-30 MED ORDER — ONDANSETRON HCL 4 MG/2ML IJ SOLN
4.0000 mg | Freq: Once | INTRAMUSCULAR | Status: AC
  Administered 2023-03-30: 4 mg via INTRAVENOUS
  Filled 2023-03-30: qty 2

## 2023-03-30 MED ORDER — MORPHINE SULFATE (PF) 4 MG/ML IV SOLN
4.0000 mg | Freq: Once | INTRAVENOUS | Status: AC
  Administered 2023-03-30: 4 mg via INTRAVENOUS
  Filled 2023-03-30: qty 1

## 2023-03-30 NOTE — ED Triage Notes (Signed)
Pt via POV c/o squeezing pressure in central chest with  radiation to back, headache, and pain behind left eye. Pt has felt nauseated and diaphoretic but denies SOB. PMH includes DM2, MI, cardiac stent, HTN, HLD, bipolar, DVT, renal disease.

## 2023-03-30 NOTE — ED Provider Notes (Signed)
[place] [facility]ERGENCY DEPARTMENT AT [facility] Provider Note   CSN: [id number] Arrival date & time: 03/30/23  1712     History  Chief Complaint  Patient presents with   Chest Pain    [name] is a 62 y.o. female.  She is here with complaint of worsening of her chronic chest pain since yesterday.  She said it is more severe than her typical chest pain that she has had since her cardiac event back in [place] over a year ago.  She said since that episode she is also had some diminished vision in her left eye and that is slightly worse along with some pain around her eye.  She feels a little short of breath.  She does not have a cardiologist up here yet.  She tells me she has had a heart attack and a cardiac stent.  She has been in the ED multiple times for chest pain since January.  She is on Eliquis  The history is provided by the patient.  Chest Pain Pain location:  Substernal area Pain quality: aching   Pain radiates to:  Mid back Pain severity:  Severe Onset quality:  Gradual Duration:  2 days Timing:  Constant Progression:  Unchanged Chronicity:  Recurrent Context: at rest   Relieved by:  Nothing Worsened by:  Nothing Ineffective treatments:  None tried Associated symptoms: back pain, headache and shortness of breath   Associated symptoms: no abdominal pain, no cough, no diaphoresis, no fever, no nausea and no vomiting   Risk factors: coronary artery disease, diabetes mellitus, high cholesterol and hypertension        Home Medications Prior to Admission medications   Medication Sig Start Date End Date Taking? Authorizing Provider  acetaminophen (TYLENOL) 325 MG tablet Take by mouth. 05/01/21   [provider]  albuterol (PROAIR HFA) 108 (90 BASE) MCG/ACT inhaler Inhale 1-2 puffs into the lungs every 6 (six) hours as needed for wheezing or shortness of breath.    [provider]  apixaban (ELIQUIS) 5 MG TABS tablet Take 1 tablet (5 mg total)  by mouth 2 (two) times daily. 11/17/17   Bethann Berkshire, MD  atorvastatin (LIPITOR) 40 MG tablet Take 40 mg by mouth every morning.    [provider]  carvedilol (COREG) 25 MG tablet Take 1 tablet (25 mg total) by mouth 2 (two) times daily with a meal. 01/21/23   Gerhard Munch, MD  clopidogrel (PLAVIX) 75 MG tablet Take 75 mg by mouth daily.    [provider]  diphenhydrAMINE (BANOPHEN) 25 mg capsule Take 25 mg by mouth at bedtime as needed for sleep. 04/25/21   [provider]  gabapentin (NEURONTIN) 300 MG capsule Take 300 mg by mouth 3 (three) times daily.    [provider]  hydrOXYzine (ATARAX) 25 MG tablet Take 1 tablet (25 mg total) by mouth every 6 (six) hours as needed for anxiety. 10/07/22   Sabas Sous, MD  insulin detemir (LEVEMIR) 100 UNIT/ML injection Inject 50 Units into the skin 2 (two) times daily.    [provider]  lamoTRIgine (LAMICTAL) 100 MG tablet Take 100 mg by mouth 2 (two) times daily. 12/19/22   [provider]  NOVOLOG FLEXPEN 100 UNIT/ML FlexPen Inject 20 Units into the skin 4 (four) times daily.  05/13/15   [provider]  ondansetron (ZOFRAN) 8 MG tablet Take 8 mg by mouth every 8 (eight) hours as needed for nausea or vomiting.  05/22/18   [provider]  ondansetron (ZOFRAN-ODT) 4 MG disintegrating tablet Take 8 mg by mouth 2 (two) times daily as needed for vomiting or nausea. 11/09/21   [provider]  sertraline (ZOLOFT) 100 MG tablet Take 100 mg by mouth daily.    [provider]      Allergies    Solu-medrol [methylprednisolone sodium succ], Contrast media [iodinated contrast media], Doxycycline, Ibuprofen, Keflex [cephalexin], Metformin and related, Nitroglycerin, Nsaids, Shellfish allergy, Sulfa antibiotics, Toradol [ketorolac tromethamine], Bentyl [dicyclomine hcl], Blueberry flavor, Nitrofurantoin monohyd macro, and Strawberry flavor    Review of Systems   Review of  Systems  Constitutional:  Negative for diaphoresis and fever.  Eyes:  Positive for pain and visual disturbance.  Respiratory:  Positive for shortness of breath. Negative for cough.   Cardiovascular:  Positive for chest pain.  Gastrointestinal:  Negative for abdominal pain, nausea and vomiting.  Musculoskeletal:  Positive for back pain.  Neurological:  Positive for headaches.    Physical Exam Updated Vital Signs BP (!) 165/97 (BP Location: Right Arm)   Pulse 88   Temp 98.7 F (37.1 C) (Oral)   Resp 16   Ht 5\' 3"  (1.6 m)   Wt 108.9 kg   SpO2 100%   BMI 42.51 kg/m  Physical Exam Vitals and nursing note reviewed.  Constitutional:      General: She is not in acute distress.    Appearance: She is well-developed.  HENT:     Head: Normocephalic and atraumatic.  Eyes:     Conjunctiva/sclera: Conjunctivae normal.  Cardiovascular:     Rate and Rhythm: Normal rate and regular rhythm.     Heart sounds: Normal heart sounds. No murmur heard. Pulmonary:     Effort: Pulmonary effort is normal. No respiratory distress.     Breath sounds: Normal breath sounds.  Abdominal:     Palpations: Abdomen is soft.     Tenderness: There is no abdominal tenderness.  Musculoskeletal:        General: No swelling. Normal range of motion.     Cervical back: Neck supple.  Skin:    General: Skin is warm and dry.     Capillary Refill: Capillary refill takes less than 2 seconds.  Neurological:     General: No focal deficit present.     Mental Status: She is alert and oriented to person, place, and time.     Motor: No weakness.     ED Results / Procedures / Treatments   Labs (all labs ordered are listed, but only abnormal results are displayed) Labs Reviewed  BASIC METABOLIC PANEL - Abnormal; Notable for the following components:      Result Value   Sodium 131 (*)    Glucose, Bld 282 (*)    Creatinine, Ser 1.49 (*)    Calcium 8.1 (*)    GFR, Estimated 40 (*)    All other components within  normal limits  CBC - Abnormal; Notable for the following components:   RBC 3.32 (*)    Hemoglobin 9.0 (*)    HCT 28.1 (*)    RDW 15.9 (*)    All other components within normal limits  PROTIME-INR  TROPONIN I (HIGH SENSITIVITY)  TROPONIN I (HIGH SENSITIVITY)    EKG EKG Interpretation Date/Time:  Sunday March 30 2023 17:28:49 EDT Ventricular Rate:  84 PR Interval:  139 QRS Duration:  97 QT Interval:  391 QTC Calculation: 463 R Axis:   58  Text Interpretation: Sinus rhythm  Minimal ST depression, diffuse leads No significant change since prior 5/24 Confirmed by Meridee Score [id number]) on 03/30/2023 5:45:15 PM  Radiology DG Chest 2 View  Result Date: 03/30/2023 CLINICAL DATA:  Chest pain EXAM: CHEST - 2 VIEW COMPARISON:  02/08/2023 FINDINGS: The heart size and mediastinal contours are within normal limits. Both lungs are clear. The visualized skeletal structures are unremarkable. IMPRESSION: No active cardiopulmonary disease. Electronically Signed   By: Ernie Avena M.D.   On: 03/30/2023 19:01    Procedures Procedures    Medications Ordered in ED Medications  morphine (PF) 4 MG/ML injection 4 mg (4 mg Intravenous Given 03/30/23 1808)  ondansetron (ZOFRAN) injection 4 mg (4 mg Intravenous Given 03/30/23 1808)  alum & mag hydroxide-simeth (MAALOX/MYLANTA) 200-200-20 MG/5ML suspension 30 mL (30 mLs Oral Given 03/30/23 2035)    ED Course/ Medical Decision Making/ A&P Clinical Course as of 03/31/23 3557  Wynelle Link Mar 30, 2023  1904 Chest x-ray interpreted by me as no acute findings.  Awaiting radiology reading. [MB]  2026 Patient's workup for acute coronary syndrome has been unremarkable.  This looks similar to her other presentations of chest pain.  She is on blood thinners so I think PE is less likely and she is satting 100% on room air.  Hemoglobin is slightly lower than baseline. [MB]  2026 She has been referred on to cardiology already but has not gone to see them yet due to  transportation issues.  Will be important that she follows up with cardiology for further evaluation of this atypical chest pain. [MB]    Clinical Course User Index [MB] Terrilee Files, MD                             Medical Decision Making Amount and/or Complexity of Data Reviewed Labs: ordered. Radiology: ordered.  Risk OTC drugs. Prescription drug management.   This patient complains of central chest pressure, decreased vision left eye, pain around left eye; this involves an extensive number of treatment Options and is a complaint that carries with it a high risk of complications and morbidity. The differential includes ACS, musculoskeletal, PE chronic pain  I ordered, reviewed and interpreted labs, which included CBC with normal white count, hemoglobin slightly lower than prior, chemistries with low sodium elevated glucose elevated creatinine, troponins flat I ordered medication IV pain medication, nausea medication, antacid and reviewed PMP when indicated. I ordered imaging studies which included chest x-ray and I independently    visualized and interpreted imaging which showed no acute findings Previous records obtained and reviewed in epic including multiple prior ED visits for similar presentation Cardiac monitoring reviewed, normal sinus rhythm Social determinants considered, no significant barriers Critical Interventions: None  After the interventions stated above, I reevaluated the patient and found patient to be stable vitals and nontoxic-appearing Admission and further testing considered, no evidence of acute cardiac ischemia.  Patient has been compliant with her anticoagulation so PE less likely and satting 98% on room air.  Patient has multiple prior ED visits for similar presentations.  No indications for admission at this time.  Recommended outpatient follow-up with her primary care doctor and establishing care with cardiology.  Return instructions  discussed         Final Clinical Impression(s) / ED Diagnoses Final diagnoses:  Nonspecific chest pain  Left-sided headache    Rx / DC Orders ED Discharge Orders     None  Terrilee Files, MD 03/31/23 [id number]

## 2023-03-30 NOTE — Discharge Instructions (Signed)
You were seen in the emergency department for chest pain.  You had blood work EKG chest x-ray that did not show any evidence of heart attack.  Please continue regular medications and follow-up with cardiology as scheduled.  Return to the emergency department if any worsening or concerning symptoms.

## 2023-04-14 ENCOUNTER — Encounter (HOSPITAL_COMMUNITY): Payer: Self-pay

## 2023-04-14 ENCOUNTER — Emergency Department (HOSPITAL_COMMUNITY)
Admission: EM | Admit: 2023-04-14 | Discharge: 2023-04-14 | Discharge status: Against Medical Advice | Payer: Medicaid - Out of State | Attending: Emergency Medicine | Admitting: Emergency Medicine

## 2023-04-14 ENCOUNTER — Other Ambulatory Visit: Payer: Self-pay

## 2023-04-14 DIAGNOSIS — R0781 Pleurodynia: Secondary | ICD-10-CM | POA: Diagnosis not present

## 2023-04-14 DIAGNOSIS — Z5321 Procedure and treatment not carried out due to patient leaving prior to being seen by health care provider: Secondary | ICD-10-CM | POA: Diagnosis not present

## 2023-04-14 DIAGNOSIS — R109 Unspecified abdominal pain: Secondary | ICD-10-CM | POA: Insufficient documentation

## 2023-04-14 NOTE — ED Triage Notes (Signed)
Pt reports right side flank pain and left side rib tenderness since yesterday.

## 2023-04-15 ENCOUNTER — Inpatient Hospital Stay (HOSPITAL_COMMUNITY)
Admission: EM | Admit: 2023-04-15 | Discharge: 2023-05-03 | DRG: 286 | Disposition: A | Discharge status: Home / Self Care | Payer: Medicaid - Out of State | Attending: Internal Medicine | Admitting: Internal Medicine

## 2023-04-15 ENCOUNTER — Encounter (HOSPITAL_COMMUNITY): Payer: Self-pay

## 2023-04-15 ENCOUNTER — Emergency Department (HOSPITAL_COMMUNITY): Payer: Medicaid - Out of State

## 2023-04-15 ENCOUNTER — Other Ambulatory Visit: Payer: Self-pay

## 2023-04-15 DIAGNOSIS — D631 Anemia in chronic kidney disease: Secondary | ICD-10-CM | POA: Diagnosis present

## 2023-04-15 DIAGNOSIS — D509 Iron deficiency anemia, unspecified: Secondary | ICD-10-CM

## 2023-04-15 DIAGNOSIS — E1165 Type 2 diabetes mellitus with hyperglycemia: Secondary | ICD-10-CM | POA: Insufficient documentation

## 2023-04-15 DIAGNOSIS — I5033 Acute on chronic diastolic (congestive) heart failure: Secondary | ICD-10-CM

## 2023-04-15 DIAGNOSIS — D649 Anemia, unspecified: Secondary | ICD-10-CM

## 2023-04-15 DIAGNOSIS — R110 Nausea: Secondary | ICD-10-CM | POA: Insufficient documentation

## 2023-04-15 DIAGNOSIS — N1832 Chronic kidney disease, stage 3b: Secondary | ICD-10-CM

## 2023-04-15 DIAGNOSIS — I2510 Atherosclerotic heart disease of native coronary artery without angina pectoris: Secondary | ICD-10-CM

## 2023-04-15 DIAGNOSIS — E118 Type 2 diabetes mellitus with unspecified complications: Secondary | ICD-10-CM

## 2023-04-15 DIAGNOSIS — Z886 Allergy status to analgesic agent status: Secondary | ICD-10-CM

## 2023-04-15 DIAGNOSIS — R1031 Right lower quadrant pain: Secondary | ICD-10-CM

## 2023-04-15 DIAGNOSIS — I951 Orthostatic hypotension: Secondary | ICD-10-CM

## 2023-04-15 DIAGNOSIS — K760 Fatty (change of) liver, not elsewhere classified: Secondary | ICD-10-CM | POA: Diagnosis present

## 2023-04-15 DIAGNOSIS — R079 Chest pain, unspecified: Principal | ICD-10-CM | POA: Diagnosis present

## 2023-04-15 DIAGNOSIS — K3184 Gastroparesis: Secondary | ICD-10-CM | POA: Diagnosis present

## 2023-04-15 DIAGNOSIS — E782 Mixed hyperlipidemia: Secondary | ICD-10-CM | POA: Diagnosis present

## 2023-04-15 DIAGNOSIS — Z882 Allergy status to sulfonamides status: Secondary | ICD-10-CM

## 2023-04-15 DIAGNOSIS — Z1152 Encounter for screening for COVID-19: Secondary | ICD-10-CM

## 2023-04-15 DIAGNOSIS — I10 Essential (primary) hypertension: Secondary | ICD-10-CM | POA: Diagnosis present

## 2023-04-15 DIAGNOSIS — I200 Unstable angina: Secondary | ICD-10-CM

## 2023-04-15 DIAGNOSIS — Z91013 Allergy to seafood: Secondary | ICD-10-CM

## 2023-04-15 DIAGNOSIS — F319 Bipolar disorder, unspecified: Secondary | ICD-10-CM | POA: Diagnosis present

## 2023-04-15 DIAGNOSIS — Z91041 Radiographic dye allergy status: Secondary | ICD-10-CM

## 2023-04-15 DIAGNOSIS — Z7901 Long term (current) use of anticoagulants: Secondary | ICD-10-CM

## 2023-04-15 DIAGNOSIS — N179 Acute kidney failure, unspecified: Secondary | ICD-10-CM | POA: Diagnosis present

## 2023-04-15 DIAGNOSIS — E871 Hypo-osmolality and hyponatremia: Secondary | ICD-10-CM

## 2023-04-15 DIAGNOSIS — Z888 Allergy status to other drugs, medicaments and biological substances status: Secondary | ICD-10-CM

## 2023-04-15 DIAGNOSIS — Z5982 Transportation insecurity: Secondary | ICD-10-CM

## 2023-04-15 DIAGNOSIS — Z9049 Acquired absence of other specified parts of digestive tract: Secondary | ICD-10-CM

## 2023-04-15 DIAGNOSIS — I272 Pulmonary hypertension, unspecified: Secondary | ICD-10-CM | POA: Diagnosis present

## 2023-04-15 DIAGNOSIS — Z6841 Body Mass Index (BMI) 40.0 and over, adult: Secondary | ICD-10-CM

## 2023-04-15 DIAGNOSIS — Z90721 Acquired absence of ovaries, unilateral: Secondary | ICD-10-CM

## 2023-04-15 DIAGNOSIS — N189 Chronic kidney disease, unspecified: Secondary | ICD-10-CM | POA: Insufficient documentation

## 2023-04-15 DIAGNOSIS — R0789 Other chest pain: Secondary | ICD-10-CM | POA: Diagnosis present

## 2023-04-15 DIAGNOSIS — J45909 Unspecified asthma, uncomplicated: Secondary | ICD-10-CM | POA: Diagnosis present

## 2023-04-15 DIAGNOSIS — Z885 Allergy status to narcotic agent status: Secondary | ICD-10-CM

## 2023-04-15 DIAGNOSIS — N1831 Chronic kidney disease, stage 3a: Secondary | ICD-10-CM | POA: Diagnosis present

## 2023-04-15 DIAGNOSIS — Z7902 Long term (current) use of antithrombotics/antiplatelets: Secondary | ICD-10-CM

## 2023-04-15 DIAGNOSIS — Z90710 Acquired absence of both cervix and uterus: Secondary | ICD-10-CM

## 2023-04-15 DIAGNOSIS — K219 Gastro-esophageal reflux disease without esophagitis: Secondary | ICD-10-CM | POA: Diagnosis present

## 2023-04-15 DIAGNOSIS — I252 Old myocardial infarction: Secondary | ICD-10-CM

## 2023-04-15 DIAGNOSIS — K5909 Other constipation: Secondary | ICD-10-CM | POA: Diagnosis present

## 2023-04-15 DIAGNOSIS — R112 Nausea with vomiting, unspecified: Secondary | ICD-10-CM | POA: Insufficient documentation

## 2023-04-15 DIAGNOSIS — E1122 Type 2 diabetes mellitus with diabetic chronic kidney disease: Secondary | ICD-10-CM | POA: Diagnosis present

## 2023-04-15 DIAGNOSIS — E1143 Type 2 diabetes mellitus with diabetic autonomic (poly)neuropathy: Secondary | ICD-10-CM | POA: Diagnosis present

## 2023-04-15 DIAGNOSIS — Z86718 Personal history of other venous thrombosis and embolism: Secondary | ICD-10-CM

## 2023-04-15 DIAGNOSIS — I25110 Atherosclerotic heart disease of native coronary artery with unstable angina pectoris: Principal | ICD-10-CM | POA: Diagnosis present

## 2023-04-15 DIAGNOSIS — T501X5A Adverse effect of loop [high-ceiling] diuretics, initial encounter: Secondary | ICD-10-CM | POA: Diagnosis not present

## 2023-04-15 DIAGNOSIS — Z881 Allergy status to other antibiotic agents status: Secondary | ICD-10-CM

## 2023-04-15 DIAGNOSIS — H5462 Unqualified visual loss, left eye, normal vision right eye: Secondary | ICD-10-CM | POA: Diagnosis present

## 2023-04-15 DIAGNOSIS — I4819 Other persistent atrial fibrillation: Secondary | ICD-10-CM | POA: Insufficient documentation

## 2023-04-15 DIAGNOSIS — Z79899 Other long term (current) drug therapy: Secondary | ICD-10-CM

## 2023-04-15 DIAGNOSIS — I5032 Chronic diastolic (congestive) heart failure: Secondary | ICD-10-CM

## 2023-04-15 DIAGNOSIS — Z794 Long term (current) use of insulin: Secondary | ICD-10-CM

## 2023-04-15 DIAGNOSIS — E66813 Obesity, class 3: Secondary | ICD-10-CM | POA: Insufficient documentation

## 2023-04-15 DIAGNOSIS — Z955 Presence of coronary angioplasty implant and graft: Secondary | ICD-10-CM

## 2023-04-15 DIAGNOSIS — Z9102 Food additives allergy status: Secondary | ICD-10-CM

## 2023-04-15 DIAGNOSIS — I130 Hypertensive heart and chronic kidney disease with heart failure and stage 1 through stage 4 chronic kidney disease, or unspecified chronic kidney disease: Secondary | ICD-10-CM | POA: Diagnosis present

## 2023-04-15 LAB — CBC
HCT: 30.7 % — ABNORMAL LOW (ref 36.0–46.0)
Hemoglobin: 10.2 g/dL — ABNORMAL LOW (ref 12.0–15.0)
MCH: 27.6 pg (ref 26.0–34.0)
MCHC: 33.2 g/dL (ref 30.0–36.0)
MCV: 83 fL (ref 80.0–100.0)
Platelets: 191 10*3/uL (ref 150–400)
RBC: 3.7 MIL/uL — ABNORMAL LOW (ref 3.87–5.11)
RDW: 16.2 % — ABNORMAL HIGH (ref 11.5–15.5)
WBC: 5.7 10*3/uL (ref 4.0–10.5)
nRBC: 0 % (ref 0.0–0.2)

## 2023-04-15 LAB — HEPATIC FUNCTION PANEL
ALT: 23 U/L (ref 0–44)
AST: 20 U/L (ref 15–41)
Albumin: 3.5 g/dL (ref 3.5–5.0)
Alkaline Phosphatase: 63 U/L (ref 38–126)
Bilirubin, Direct: <0.1 mg/dL (ref 0.0–0.2)
Total Bilirubin: 0.5 mg/dL (ref 0.3–1.2)
Total Protein: 8.9 g/dL — ABNORMAL HIGH (ref 6.5–8.1)

## 2023-04-15 LAB — BASIC METABOLIC PANEL
Anion gap: 10 (ref 5–15)
BUN: 28 mg/dL — ABNORMAL HIGH (ref 6–20)
CO2: 20 mmol/L — ABNORMAL LOW (ref 22–32)
Calcium: 8.8 mg/dL — ABNORMAL LOW (ref 8.9–10.3)
Chloride: 100 mmol/L (ref 98–111)
Creatinine, Ser: 1.94 mg/dL — ABNORMAL HIGH (ref 0.44–1.00)
GFR, Estimated: 29 mL/min — ABNORMAL LOW (ref >60)
Glucose, Bld: 302 mg/dL — ABNORMAL HIGH (ref 70–99)
Potassium: 4 mmol/L (ref 3.5–5.1)
Sodium: 130 mmol/L — ABNORMAL LOW (ref 135–145)

## 2023-04-15 LAB — TROPONIN I (HIGH SENSITIVITY): Troponin I (High Sensitivity): 5 ng/L (ref <18)

## 2023-04-15 LAB — LIPASE, BLOOD: Lipase: 47 U/L (ref 11–51)

## 2023-04-15 MED ORDER — ONDANSETRON HCL 4 MG/2ML IJ SOLN
4.0000 mg | Freq: Once | INTRAMUSCULAR | Status: AC
  Administered 2023-04-15: 4 mg via INTRAVENOUS
  Filled 2023-04-15: qty 2

## 2023-04-15 MED ORDER — MORPHINE SULFATE (PF) 4 MG/ML IV SOLN
2.0000 mg | Freq: Once | INTRAVENOUS | Status: AC
  Administered 2023-04-15: 2 mg via INTRAVENOUS
  Filled 2023-04-15: qty 1

## 2023-04-15 NOTE — ED Provider Notes (Signed)
[name] Provider Note   CSN: [id number] Arrival date & time: 04/15/23  2135     History {Add pertinent medical, surgical, social history, OB history to HPI:1} Chief Complaint  Patient presents with   Chest Pain    [name] is a 62 y.o. female.  62 year old female with a history of CAD status post PCI, DVT on Eliquis, diabetes, hypertension, and hyperlipidemia who presents to the emergency department with chest pain.  Patient reports that yesterday she started transient right-sided flank pain.  Says that it started limiting her from sleeping.  Today at 10:30 PM she started experiencing sharp chest pain that was severe and radiating to her back.  Was nauseous but no diaphoresis or vomiting.  No significant shortness of breath.  Does not appear to be exertional.  No significant alcohol use.  Denies any fevers, dysuria, frequency.       Home Medications Prior to Admission medications   Medication Sig Start Date End Date Taking? Authorizing Provider  acetaminophen (TYLENOL) 325 MG tablet Take by mouth. 05/01/21   [provider]  albuterol (PROAIR HFA) 108 (90 BASE) MCG/ACT inhaler Inhale 1-2 puffs into the lungs every 6 (six) hours as needed for wheezing or shortness of breath.    [provider]  apixaban (ELIQUIS) 5 MG TABS tablet Take 1 tablet (5 mg total) by mouth 2 (two) times daily. 11/17/17   Bethann Berkshire, MD  atorvastatin (LIPITOR) 40 MG tablet Take 40 mg by mouth every morning.    [provider]  carvedilol (COREG) 25 MG tablet Take 1 tablet (25 mg total) by mouth 2 (two) times daily with a meal. 01/21/23   Gerhard Munch, MD  clopidogrel (PLAVIX) 75 MG tablet Take 75 mg by mouth daily.    [provider]  diphenhydrAMINE (BANOPHEN) 25 mg capsule Take 25 mg by mouth at bedtime as needed for sleep. 04/25/21   [provider]  gabapentin (NEURONTIN) 300 MG capsule Take 300 mg by  mouth 3 (three) times daily.    [provider]  hydrOXYzine (ATARAX) 25 MG tablet Take 1 tablet (25 mg total) by mouth every 6 (six) hours as needed for anxiety. 10/07/22   Sabas Sous, MD  insulin detemir (LEVEMIR) 100 UNIT/ML injection Inject 50 Units into the skin 2 (two) times daily.    [provider]  lamoTRIgine (LAMICTAL) 100 MG tablet Take 100 mg by mouth 2 (two) times daily. 12/19/22   [provider]  NOVOLOG FLEXPEN 100 UNIT/ML FlexPen Inject 20 Units into the skin 4 (four) times daily.  05/13/15   [provider]  ondansetron (ZOFRAN) 8 MG tablet Take 8 mg by mouth every 8 (eight) hours as needed for nausea or vomiting.  05/22/18   [provider]  ondansetron (ZOFRAN-ODT) 4 MG disintegrating tablet Take 8 mg by mouth 2 (two) times daily as needed for vomiting or nausea. 11/09/21   [provider]  sertraline (ZOLOFT) 100 MG tablet Take 100 mg by mouth daily.    [provider]      Allergies    Solu-medrol [methylprednisolone sodium succ], Contrast media [iodinated contrast media], Doxycycline, Ibuprofen, Keflex [cephalexin], Metformin and related, Nitroglycerin, Nsaids, Shellfish allergy, Sulfa antibiotics, Toradol [ketorolac tromethamine], Bentyl [dicyclomine hcl], Blueberry flavor, Nitrofurantoin monohyd macro, and Strawberry flavor    Review of Systems   Review of Systems  Physical Exam Updated Vital Signs BP (!) 155/81 (BP Location: Right Arm)  Pulse 100   Temp 98 F (36.7 C) (Oral)   Resp 18   Ht 5\' 3"  (1.6 m)   Wt 111.1 kg   SpO2 99%   BMI 43.40 kg/m  Physical Exam Vitals and nursing note reviewed.  Constitutional:      General: She is not in acute distress.    Appearance: She is well-developed.  HENT:     Head: Normocephalic and atraumatic.     Right Ear: External ear normal.     Left Ear: External ear normal.     Nose: Nose normal.  Eyes:     Extraocular Movements: Extraocular movements intact.      Conjunctiva/sclera: Conjunctivae normal.     Pupils: Pupils are equal, round, and reactive to light.  Cardiovascular:     Rate and Rhythm: Normal rate and regular rhythm.     Heart sounds: No murmur heard.    Comments: Radial pulses 2+ bilaterally Pulmonary:     Effort: Pulmonary effort is normal. No respiratory distress.     Breath sounds: Normal breath sounds.  Abdominal:     General: Abdomen is flat. There is no distension.     Palpations: Abdomen is soft. There is no mass.     Tenderness: There is no abdominal tenderness. There is no guarding.  Musculoskeletal:     Cervical back: Normal range of motion and neck supple.     Right lower leg: No edema.     Left lower leg: No edema.  Skin:    General: Skin is warm and dry.  Neurological:     Mental Status: She is alert and oriented to person, place, and time. Mental status is at baseline.  Psychiatric:        Mood and Affect: Mood normal.     ED Results / Procedures / Treatments   Labs (all labs ordered are listed, but only abnormal results are displayed) Labs Reviewed  BASIC METABOLIC PANEL - Abnormal; Notable for the following components:      Result Value   Sodium 130 (*)    CO2 20 (*)    Glucose, Bld 302 (*)    BUN 28 (*)    Creatinine, Ser 1.94 (*)    Calcium 8.8 (*)    GFR, Estimated 29 (*)    All other components within normal limits  CBC - Abnormal; Notable for the following components:   RBC 3.70 (*)    Hemoglobin 10.2 (*)    HCT 30.7 (*)    RDW 16.2 (*)    All other components within normal limits  TROPONIN I (HIGH SENSITIVITY)    EKG None  Radiology DG Chest 2 View  Result Date: 04/15/2023 CLINICAL DATA:  Left-sided chest pain for 2 days EXAM: CHEST - 2 VIEW COMPARISON:  03/30/2023 FINDINGS: Frontal and lateral views of the chest demonstrate an unremarkable cardiac silhouette. No airspace disease, effusion, or pneumothorax. No acute bony abnormality. IMPRESSION: 1. No acute intrathoracic process.  Electronically Signed   By: Sharlet Salina M.D.   On: 04/15/2023 22:33    Procedures Procedures  {Document cardiac monitor, telemetry assessment procedure when appropriate:1}  Medications Ordered in ED Medications - No data to display  ED Course/ Medical Decision Making/ A&P Clinical Course as of 04/15/23 2254  Tue Apr 15, 2023  2237 Creatinine(!): 1.94 Worsened from baseline [RP]    Clinical Course User Index [RP] Rondel Baton, MD   {   Click here for ABCD2, HEART and other calculatorsREFRESH  Note before signing :1}                              Medical Decision Making Amount and/or Complexity of Data Reviewed Labs: ordered. Decision-making details documented in ED Course. Radiology: ordered.  Risk Prescription drug management.   ***  {Document critical care time when appropriate:1} {Document review of labs and clinical decision tools ie heart score, Chads2Vasc2 etc:1}  {Document your independent review of radiology images, and any outside records:1} {Document your discussion with family members, caretakers, and with consultants:1} {Document social determinants of health affecting pt's care:1} {Document your decision making why or why not admission, treatments were needed:1} Final Clinical Impression(s) / ED Diagnoses Final diagnoses:  None    Rx / DC Orders ED Discharge Orders     None

## 2023-04-15 NOTE — ED Notes (Signed)
Pt transported to Xray. 

## 2023-04-15 NOTE — ED Triage Notes (Signed)
Pt reports she started having left side chest pain that radiated into her back this evening.  Pt came to ER yesterday for left side rib pain and right side abd pain but left after triage due to a long wait.  Pt reports she is still having those pains as well.

## 2023-04-16 DIAGNOSIS — R112 Nausea with vomiting, unspecified: Secondary | ICD-10-CM | POA: Insufficient documentation

## 2023-04-16 DIAGNOSIS — N179 Acute kidney failure, unspecified: Secondary | ICD-10-CM | POA: Insufficient documentation

## 2023-04-16 DIAGNOSIS — I2510 Atherosclerotic heart disease of native coronary artery without angina pectoris: Secondary | ICD-10-CM | POA: Diagnosis not present

## 2023-04-16 DIAGNOSIS — I4891 Unspecified atrial fibrillation: Secondary | ICD-10-CM | POA: Diagnosis not present

## 2023-04-16 DIAGNOSIS — E782 Mixed hyperlipidemia: Secondary | ICD-10-CM

## 2023-04-16 DIAGNOSIS — I482 Chronic atrial fibrillation, unspecified: Secondary | ICD-10-CM

## 2023-04-16 DIAGNOSIS — Z9861 Coronary angioplasty status: Secondary | ICD-10-CM

## 2023-04-16 DIAGNOSIS — R0789 Other chest pain: Secondary | ICD-10-CM | POA: Diagnosis present

## 2023-04-16 DIAGNOSIS — E66813 Obesity, class 3: Secondary | ICD-10-CM | POA: Insufficient documentation

## 2023-04-16 DIAGNOSIS — I10 Essential (primary) hypertension: Secondary | ICD-10-CM

## 2023-04-16 DIAGNOSIS — E1165 Type 2 diabetes mellitus with hyperglycemia: Secondary | ICD-10-CM | POA: Diagnosis not present

## 2023-04-16 DIAGNOSIS — I4819 Other persistent atrial fibrillation: Secondary | ICD-10-CM | POA: Insufficient documentation

## 2023-04-16 DIAGNOSIS — R110 Nausea: Secondary | ICD-10-CM | POA: Diagnosis not present

## 2023-04-16 DIAGNOSIS — N189 Chronic kidney disease, unspecified: Secondary | ICD-10-CM | POA: Insufficient documentation

## 2023-04-16 DIAGNOSIS — Z794 Long term (current) use of insulin: Secondary | ICD-10-CM

## 2023-04-16 LAB — COMPREHENSIVE METABOLIC PANEL WITH GFR
ALT: 24 U/L (ref 0–44)
AST: 21 U/L (ref 15–41)
Albumin: 3.3 g/dL — ABNORMAL LOW (ref 3.5–5.0)
Alkaline Phosphatase: 53 U/L (ref 38–126)
Anion gap: 10 (ref 5–15)
BUN: 27 mg/dL — ABNORMAL HIGH (ref 6–20)
CO2: 23 mmol/L (ref 22–32)
Calcium: 8.8 mg/dL — ABNORMAL LOW (ref 8.9–10.3)
Chloride: 102 mmol/L (ref 98–111)
Creatinine, Ser: 1.89 mg/dL — ABNORMAL HIGH (ref 0.44–1.00)
GFR, Estimated: 30 mL/min — ABNORMAL LOW
Glucose, Bld: 210 mg/dL — ABNORMAL HIGH (ref 70–99)
Potassium: 4 mmol/L (ref 3.5–5.1)
Sodium: 135 mmol/L (ref 135–145)
Total Bilirubin: 0.4 mg/dL (ref 0.3–1.2)
Total Protein: 8.4 g/dL — ABNORMAL HIGH (ref 6.5–8.1)

## 2023-04-16 LAB — CBC
HCT: 30.4 % — ABNORMAL LOW (ref 36.0–46.0)
Hemoglobin: 9.8 g/dL — ABNORMAL LOW (ref 12.0–15.0)
MCH: 27.1 pg (ref 26.0–34.0)
MCHC: 32.2 g/dL (ref 30.0–36.0)
MCV: 84 fL (ref 80.0–100.0)
Platelets: 178 K/uL (ref 150–400)
RBC: 3.62 MIL/uL — ABNORMAL LOW (ref 3.87–5.11)
RDW: 16.1 % — ABNORMAL HIGH (ref 11.5–15.5)
WBC: 4.8 K/uL (ref 4.0–10.5)
nRBC: 0 % (ref 0.0–0.2)

## 2023-04-16 LAB — GLUCOSE, CAPILLARY
Glucose-Capillary: 215 mg/dL — ABNORMAL HIGH (ref 70–99)
Glucose-Capillary: 219 mg/dL — ABNORMAL HIGH (ref 70–99)

## 2023-04-16 LAB — MAGNESIUM: Magnesium: 1.7 mg/dL (ref 1.7–2.4)

## 2023-04-16 LAB — PHOSPHORUS: Phosphorus: 4.8 mg/dL — ABNORMAL HIGH (ref 2.5–4.6)

## 2023-04-16 LAB — APTT
aPTT: 28 seconds (ref 24–36)
aPTT: 42 seconds — ABNORMAL HIGH (ref 24–36)

## 2023-04-16 LAB — CBG MONITORING, ED
Glucose-Capillary: 214 mg/dL — ABNORMAL HIGH (ref 70–99)
Glucose-Capillary: 232 mg/dL — ABNORMAL HIGH (ref 70–99)

## 2023-04-16 LAB — HEPARIN LEVEL (UNFRACTIONATED)
Heparin Unfractionated: <0.1 IU/mL — ABNORMAL LOW (ref 0.30–0.70)
Heparin Unfractionated: 0.16 IU/mL — ABNORMAL LOW (ref 0.30–0.70)

## 2023-04-16 LAB — TSH: TSH: 5.975 u[IU]/mL — ABNORMAL HIGH (ref 0.350–4.500)

## 2023-04-16 LAB — T4, FREE: Free T4: 0.93 ng/dL (ref 0.61–1.12)

## 2023-04-16 LAB — HIV ANTIBODY (ROUTINE TESTING W REFLEX): HIV Screen 4th Generation wRfx: NONREACTIVE

## 2023-04-16 MED ORDER — ACETAMINOPHEN 325 MG PO TABS
650.0000 mg | ORAL_TABLET | Freq: Four times a day (QID) | ORAL | Status: DC | PRN
  Administered 2023-04-16 – 2023-04-23 (×3): 650 mg via ORAL
  Filled 2023-04-16 (×4): qty 2

## 2023-04-16 MED ORDER — SODIUM CHLORIDE 0.9 % IV SOLN: INTRAVENOUS | Status: DC

## 2023-04-16 MED ORDER — FENTANYL CITRATE PF 50 MCG/ML IJ SOSY
25.0000 ug | PREFILLED_SYRINGE | INTRAMUSCULAR | Status: DC | PRN
  Administered 2023-04-16 – 2023-05-03 (×49): 25 ug via INTRAVENOUS
  Filled 2023-04-16 (×49): qty 1

## 2023-04-16 MED ORDER — SERTRALINE HCL 100 MG PO TABS
100.0000 mg | ORAL_TABLET | Freq: Every day | ORAL | Status: DC
  Administered 2023-04-16 – 2023-05-03 (×18): 100 mg via ORAL
  Filled 2023-04-16: qty 1
  Filled 2023-04-16 (×2): qty 2
  Filled 2023-04-16 (×2): qty 1
  Filled 2023-04-16: qty 2
  Filled 2023-04-16 (×2): qty 1
  Filled 2023-04-16 (×3): qty 2
  Filled 2023-04-16: qty 1
  Filled 2023-04-16: qty 2
  Filled 2023-04-16 (×2): qty 1
  Filled 2023-04-16: qty 2
  Filled 2023-04-16: qty 1
  Filled 2023-04-16: qty 2

## 2023-04-16 MED ORDER — ACETAMINOPHEN 650 MG RE SUPP: 650.0000 mg | Freq: Four times a day (QID) | RECTAL | Status: DC | PRN

## 2023-04-16 MED ORDER — INSULIN DETEMIR 100 UNIT/ML ~~LOC~~ SOLN
50.0000 [IU] | Freq: Two times a day (BID) | SUBCUTANEOUS | Status: DC
  Administered 2023-04-16 – 2023-05-03 (×34): 50 [IU] via SUBCUTANEOUS
  Filled 2023-04-16 (×38): qty 0.5

## 2023-04-16 MED ORDER — GABAPENTIN 300 MG PO CAPS
300.0000 mg | ORAL_CAPSULE | Freq: Every day | ORAL | Status: DC
  Administered 2023-04-16 – 2023-05-02 (×17): 300 mg via ORAL
  Filled 2023-04-16 (×17): qty 1

## 2023-04-16 MED ORDER — INSULIN ASPART 100 UNIT/ML IJ SOLN
0.0000 [IU] | INTRAMUSCULAR | Status: DC
  Administered 2023-04-16 – 2023-04-17 (×6): 5 [IU] via SUBCUTANEOUS
  Administered 2023-04-17: 2 [IU] via SUBCUTANEOUS
  Administered 2023-04-17 (×2): 3 [IU] via SUBCUTANEOUS
  Filled 2023-04-16 (×2): qty 1

## 2023-04-16 MED ORDER — OXYCODONE HCL 5 MG PO TABS
5.0000 mg | ORAL_TABLET | Freq: Four times a day (QID) | ORAL | Status: DC | PRN
  Administered 2023-04-16 – 2023-05-02 (×31): 5 mg via ORAL
  Filled 2023-04-16 (×32): qty 1

## 2023-04-16 MED ORDER — ONDANSETRON HCL 4 MG/2ML IJ SOLN
4.0000 mg | Freq: Once | INTRAMUSCULAR | Status: AC
  Administered 2023-04-16: 4 mg via INTRAVENOUS
  Filled 2023-04-16: qty 2

## 2023-04-16 MED ORDER — ATORVASTATIN CALCIUM 40 MG PO TABS
40.0000 mg | ORAL_TABLET | Freq: Every morning | ORAL | Status: DC
  Administered 2023-04-16: 40 mg via ORAL
  Filled 2023-04-16: qty 1

## 2023-04-16 MED ORDER — INSULIN ASPART 100 UNIT/ML IJ SOLN
10.0000 [IU] | Freq: Three times a day (TID) | INTRAMUSCULAR | Status: DC
  Administered 2023-04-17 (×3): 10 [IU] via SUBCUTANEOUS

## 2023-04-16 MED ORDER — CLOPIDOGREL BISULFATE 75 MG PO TABS
75.0000 mg | ORAL_TABLET | Freq: Every day | ORAL | Status: DC
  Administered 2023-04-16 – 2023-05-03 (×18): 75 mg via ORAL
  Filled 2023-04-16 (×18): qty 1

## 2023-04-16 MED ORDER — CARVEDILOL 12.5 MG PO TABS
12.5000 mg | ORAL_TABLET | Freq: Two times a day (BID) | ORAL | Status: DC
  Administered 2023-04-16 – 2023-05-03 (×35): 12.5 mg via ORAL
  Filled 2023-04-16 (×36): qty 1

## 2023-04-16 MED ORDER — ISOSORBIDE MONONITRATE ER 60 MG PO TB24
30.0000 mg | ORAL_TABLET | Freq: Every morning | ORAL | Status: DC
  Administered 2023-04-17 – 2023-04-24 (×8): 30 mg via ORAL
  Filled 2023-04-16 (×8): qty 1

## 2023-04-16 MED ORDER — ORAL CARE MOUTH RINSE: 15.0000 mL | OROMUCOSAL | Status: DC | PRN

## 2023-04-16 MED ORDER — HEPARIN (PORCINE) 25000 UT/250ML-% IV SOLN
1950.0000 [IU]/h | INTRAVENOUS | Status: DC
  Administered 2023-04-16: 1000 [IU]/h via INTRAVENOUS
  Administered 2023-04-17 – 2023-04-21 (×6): 1500 [IU]/h via INTRAVENOUS
  Administered 2023-04-21: 1650 [IU]/h via INTRAVENOUS
  Administered 2023-04-22: 1950 [IU]/h via INTRAVENOUS
  Administered 2023-04-22: 1850 [IU]/h via INTRAVENOUS
  Administered 2023-04-23 – 2023-04-24 (×2): 1950 [IU]/h via INTRAVENOUS
  Filled 2023-04-16 (×12): qty 250

## 2023-04-16 MED ORDER — MORPHINE SULFATE (PF) 4 MG/ML IV SOLN
4.0000 mg | Freq: Once | INTRAVENOUS | Status: AC
  Administered 2023-04-16: 4 mg via INTRAVENOUS
  Filled 2023-04-16: qty 1

## 2023-04-16 MED ORDER — APIXABAN 5 MG PO TABS: 5.0000 mg | ORAL_TABLET | Freq: Two times a day (BID) | ORAL | Status: DC

## 2023-04-16 MED ORDER — DIPHENHYDRAMINE HCL 25 MG PO CAPS
25.0000 mg | ORAL_CAPSULE | Freq: Every evening | ORAL | Status: DC | PRN
  Administered 2023-04-28 – 2023-04-29 (×3): 25 mg via ORAL
  Filled 2023-04-16 (×3): qty 1

## 2023-04-16 MED ORDER — ONDANSETRON HCL 4 MG PO TABS
4.0000 mg | ORAL_TABLET | Freq: Four times a day (QID) | ORAL | Status: DC | PRN
  Administered 2023-04-17 – 2023-05-02 (×6): 4 mg via ORAL
  Filled 2023-04-16 (×6): qty 1

## 2023-04-16 MED ORDER — HEPARIN BOLUS VIA INFUSION
4000.0000 [IU] | Freq: Once | INTRAVENOUS | Status: AC
  Administered 2023-04-16: 4000 [IU] via INTRAVENOUS
  Filled 2023-04-16: qty 4000

## 2023-04-16 MED ORDER — ASPIRIN 81 MG PO TBEC
81.0000 mg | DELAYED_RELEASE_TABLET | Freq: Every day | ORAL | Status: DC
  Administered 2023-04-16 – 2023-04-29 (×14): 81 mg via ORAL
  Filled 2023-04-16 (×14): qty 1

## 2023-04-16 MED ORDER — ONDANSETRON HCL 4 MG/2ML IJ SOLN
4.0000 mg | Freq: Four times a day (QID) | INTRAMUSCULAR | Status: DC | PRN
  Administered 2023-04-16 – 2023-05-02 (×12): 4 mg via INTRAVENOUS
  Filled 2023-04-16 (×12): qty 2

## 2023-04-16 MED ORDER — LAMOTRIGINE 100 MG PO TABS
100.0000 mg | ORAL_TABLET | Freq: Two times a day (BID) | ORAL | Status: DC
  Administered 2023-04-16 – 2023-05-03 (×34): 100 mg via ORAL
  Filled 2023-04-16 (×34): qty 1

## 2023-04-16 NOTE — Consult Note (Addendum)
Cardiology Consultation   Patient ID: [name] MRN: [id number]; DOB: [birth year]  Admit date: 04/15/2023 Date of Consult: 04/16/2023  PCP:  Patient, No Pcp Per   [facility] Providers Cardiologist:  New to [facility]   Patient Profile:   [name] is a 62 y.o. female with a hx of CAD (s/p NSTEMI with DES to mid-RCA in 04/2021 in [place] by review of Care Everywhere), HTN, HLD, Type 2 DM, history of DVT's, Stage 3 CKD, atrial fibrillation, Bipolar disorder, depression, anxiety and gastroparesis who is being seen 04/16/2023 for the evaluation of chest pain at the request of Dr. Thomes Dinning.  History of Present Illness:   [name] has been evaluated at [facility] ED for chest pain multiple times over the past few months with most recent being on 03/30/2023. Hs troponin values were negative and her symptoms were felt to be atypical for a cardiac etiology.  She presented to [facility] ED yesterday evening for evaluation of left-sided chest discomfort which radiated into her back. In talking with the patient today, she reports a variety of symptoms over the past few months. Reports she "does not feel like herself and just feels sick". 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 Says that she has noticed episodes of dyspnea, diaphoresis and presyncope which occur with walking and has been notable when walking at [place] or the grocery store. Denies any actual syncopal events. Reports her shortness of breath has been persistent since undergoing stent placement in 2022. She did not establish with a cardiologist upon moving to [place] but says she has remained on Plavix along with Eliquis as this has been filled by her PCP. She reports episodes of chest discomfort which she describes as a shooting pain and these last for a few seconds and occur at rest or with activity. Also reports having right flank pain which  has been present intermittently for the past few days. She denies any specific orthopnea, PND or pitting edema. Says that she does have a history of panic attacks and has been without Klonopin for several months.  Initial labs showed WBC 5.7, Hgb 10.2 (close to baseline), platelets 191, Na+ 130, K+ 4.0 and creatinine 1.94 (baseline 1.2 - 1.4). Glucose elevated to 302. Lipase normal at 47. Initial and repeat Hs Troponin values negative at 5 and 6. CXR with no acute abnormalities. CT chest/abdomen showed no acute abnormalities. Was noted to have a nonobstructive right renal calculus and mild hepatosplenomegaly. Also noted in the report to have coronary artery calcifications and aortic atherosclerosis. EKG shows sinus tachycardia, HR 106 with LVH with ST elevation along AVR which is similar to prior tracings.   Past Medical History:  Diagnosis Date   Asthma    Atrial fibrillation (HCC)    Bipolar 1 disorder (HCC)    Chronic back pain    Chronic chest pain    Diabetes mellitus without complication (HCC)    Gastroparesis    GERD (gastroesophageal reflux disease)    Hyperlipemia    Hypertension    IBS (irritable bowel syndrome)    Normal cardiac stress test 02/2014   [facility]    Past Surgical History:  Procedure Laterality Date   ABDOMINAL HYSTERECTOMY  2001   APPENDECTOMY  10/2005   CHOLECYSTECTOMY  10/2005   COLONOSCOPY  2007   [place] ([name])-pt reports hemorrhoids   ESOPHAGOGASTRODUODENOSCOPY  02/28/2006   [name], normal  on daily PPI, myultiple hyperplastic polyps   MOUTH SURGERY     RIGHT OOPHORECTOMY  1999     Home Medications:  Prior to Admission medications   Medication Sig Start Date End Date Taking? Authorizing Provider  acetaminophen (TYLENOL) 325 MG tablet Take by mouth. 05/01/21   [provider]  albuterol (PROAIR HFA) 108 (90 BASE) MCG/ACT inhaler Inhale 1-2 puffs into the lungs every 6 (six) hours as needed for wheezing or shortness of breath.     [provider]  apixaban (ELIQUIS) 5 MG TABS tablet Take 1 tablet (5 mg total) by mouth 2 (two) times daily. 11/17/17   Bethann Berkshire, MD  atorvastatin (LIPITOR) 40 MG tablet Take 40 mg by mouth every morning.    [provider]  carvedilol (COREG) 25 MG tablet Take 1 tablet (25 mg total) by mouth 2 (two) times daily with a meal. 01/21/23   Gerhard Munch, MD  clopidogrel (PLAVIX) 75 MG tablet Take 75 mg by mouth daily.    [provider]  diphenhydrAMINE (BANOPHEN) 25 mg capsule Take 25 mg by mouth at bedtime as needed for sleep. 04/25/21   [provider]  gabapentin (NEURONTIN) 300 MG capsule Take 300 mg by mouth 3 (three) times daily.    [provider]  hydrOXYzine (ATARAX) 25 MG tablet Take 1 tablet (25 mg total) by mouth every 6 (six) hours as needed for anxiety. 10/07/22   Sabas Sous, MD  insulin detemir (LEVEMIR) 100 UNIT/ML injection Inject 50 Units into the skin 2 (two) times daily.    [provider]  lamoTRIgine (LAMICTAL) 100 MG tablet Take 100 mg by mouth 2 (two) times daily. 12/19/22   [provider]  NOVOLOG FLEXPEN 100 UNIT/ML FlexPen Inject 20 Units into the skin 4 (four) times daily.  05/13/15   [provider]  ondansetron (ZOFRAN) 8 MG tablet Take 8 mg by mouth every 8 (eight) hours as needed for nausea or vomiting.  05/22/18   [provider]  ondansetron (ZOFRAN-ODT) 4 MG disintegrating tablet Take 8 mg by mouth 2 (two) times daily as needed for vomiting or nausea. 11/09/21   [provider]  sertraline (ZOLOFT) 100 MG tablet Take 100 mg by mouth daily.    [provider]    Inpatient Medications: Scheduled Meds:  apixaban  5 mg Oral BID   atorvastatin  40 mg Oral q morning   clopidogrel  75 mg Oral Daily   insulin aspart  0-15 Units Subcutaneous Q4H   Continuous Infusions:  sodium chloride     PRN Meds: acetaminophen **OR** acetaminophen, ondansetron **OR** ondansetron  (ZOFRAN) IV  Allergies:    Allergies  Allergen Reactions   Solu-Medrol [Methylprednisolone Sodium Succ] Shortness Of Breath   Contrast Media [Iodinated Contrast Media] Itching and Swelling   Doxycycline Hives   Ibuprofen Other (See Comments)    GI upset   Keflex [Cephalexin] Nausea And Vomiting   Metformin And Related Nausea Only   Nitroglycerin Nausea And Vomiting   Nsaids Other (See Comments)    Stomach pain/bleeding   Shellfish Allergy Itching and Swelling   Sulfa Antibiotics Nausea And Vomiting   Toradol [Ketorolac Tromethamine] Hives   Bentyl [Dicyclomine Hcl] Anxiety   Blueberry Flavor Nausea And Vomiting and Rash   Nitrofurantoin Monohyd Macro Itching   Strawberry Flavor Nausea And Vomiting and Rash    Social History:   Social History   Socioeconomic History   Marital status: Married    Spouse  name: Not on file   Number of children: 4   Years of education: Not on file   Highest education level: Not on file  Occupational History   Occupation: disabled  Tobacco Use   Smoking status: Passive Smoke Exposure - Never Smoker   Smokeless tobacco: Never  Vaping Use   Vaping status: Never Used  Substance and Sexual Activity   Alcohol use: No   Drug use: No   Sexual activity: Not on file  Other Topics Concern   Not on file  Social History Narrative   Lives w/ husband, daughter, son-in-law & grandkids   Social Determinants of Health   Financial Resource Strain: Not on file  Food Insecurity: Not on file  Transportation Needs: Not on file  Physical Activity: Not on file  Stress: Not on file  Social Connections: Not on file  Intimate Partner Violence: Low Risk  (03/05/2022)   Received from [facility]   Interpersonal Safety    Feels UN-safe at Home or Work/School: no    Family History:    Family History  Problem Relation Age of Onset   Cirrhosis Mother 39       ?etiology   Cirrhosis Father        ?etiology    Cirrhosis Sister 30       ?etiology   Diverticulitis Sister      ROS:  Please see the history of present illness.   All other ROS reviewed and negative.     Physical Exam/Data:   Vitals:   04/15/23 2151 04/16/23 0050 04/16/23 0530 04/16/23 0600  BP: (!) 155/81 (!) 142/79 132/85   Pulse: 100 89 80 81  Resp: 18 18 18    Temp: 98 F (36.7 C)     TempSrc: Oral     SpO2: 99% 97% 96% 97%  Weight:      Height:       No intake or output data in the 24 hours ending 04/16/23 0737    04/15/2023    9:44 PM 04/14/2023    8:01 PM 03/30/2023    5:22 PM  Last 3 Weights  Weight (lbs) 245 lb 245 lb 240 lb  Weight (kg) 111.131 kg 111.131 kg 108.863 kg     Body mass index is 43.4 kg/m.  General:  Well nourished, well developed female appearing in no acute distress. HEENT: normal Neck: no JVD Vascular: No carotid bruits; Distal pulses 2+ bilaterally Cardiac:  normal S1, S2; RRR; no murmur  Lungs:  clear to auscultation bilaterally, no wheezing, rhonchi or rales  Abd: soft, nontender, no hepatomegaly  Ext: no pitting edema Musculoskeletal:  No deformities, BUE and BLE strength normal and equal Skin: warm and dry  Neuro:  CNs 2-12 intact, no focal abnormalities noted Psych:  Normal affect   EKG:  The EKG was personally reviewed and demonstrates: Sinus tachycardia, HR 106 with LVH with ST elevation along AVR which is similar to prior tracings.  Telemetry:  Telemetry was personally reviewed and demonstrates: NSR, HR in the 70's to 80's.   Relevant CV Studies:   NST: 10/2020 ECG  At baseline, normal sinus rhythm, indication is normal ECG.  There is no ST segment changes diagnostic of ischemia noted during stress.  There were no arrhythmias during stress.  There were no arrhythmias during recovery.   Nuclear Study Quality  The image quality of the stress study is good.   Relative Perfusion Comments  LV  perfusion is normal.   Stress Function Comments  Left ventricular function  post-stress was normal. There were no regional wall motion abnormalities during stress. Post-stress ejection fraction was 75%.    Cardiac Catheterization: 04/2021 Findings:  Coronary Arteries:   LM: Large, with 20% lesion in the distal LM prior to trifurcation.   LAD: Large, wraps around the apex. Gives off a small to medium sized  diagonal Dashanae Longfield. There are two tandem tubular 40% stenoses in the proximal  and mid LAD.   Ramus: Large, bifurcates distally. 30-40% stenosis in the proximal to mid  vessel.   LCx: Large, non-dominant. Large OM1. No significant atherosclerosis.   RCA: Large, dominant. Gives off medium sized RPDA and RPLV branches.  Proximal stenosis with some haziness with 30-40% visual stenosis. 99%  stenosis in the mid RCA right before an acute marginal Deddrick Saindon, with TIMI  2b flow distally. No change in stenosis or flow with intracoronary  nitroglycerin.   LVEDP: 9 mmHg   Interventional Summary:   Lesion Vessel  Culprit Length Graft High/C  FINAL PCI LESION: Mid RCA Yes 15mm No Yes  Severe Calcification Bifurication Pre-stenosis Pre-TIMI Post-stenosis  Post-TIMI  Yes No RCA 2 RCA 3    Percutaneous coronary intervention of the mid right coronary artery was  performed. The RCA was engaged with a 37F AL 0.75 guide catheter. The  distal vessel was wired with a 0.014" Sion Black guidewire. The lesion was  predilated using a 2.5 x 12mm balloon. IVUS of the mid RCA was performed  with a Volcano IVUS catheter. This showed showing significant  fibrocalcific plaque with distal and proximal reference vessel size of  2.7-2.8 mm. The hazy portion in the proximal RCA on angiography  corresponded to a lesion with near concentric calcification and an MLA of  4.2 mm2. Accordingly, we decided to leave this lesion alone.   The mid RCA lesion was further modified with a 2.75 x 12 mm Piffard Trek  balloon. The lesion was stented with a 2.75 x 18 mm Osiro drug eluting  stent. The stent  was post-dilated with a 2.75 x 12 mm  balloon. Final  angiographic result was excellent. The arteriotomy site was successfully  compressed with a TR band.   Complications: none   Echocardiogram: 10/2021 This result has an attachment that is not available.   Normal chamber sizes.   Concentric remodeling.   Normal left and right ventricular systolic function. LVEF 63% by biplane  method of disks. Normal left ventricular wall motion.   Normal left ventricular diastolic function.   No significant valvular regurgitation.   Unable to estimate right ventricular systolic pressure due to  insufficient tricuspid regurgitation Doppler signal.   Inferior vena cava is not well visualized.   Compared to echo dated 05/01/2021, regional wall motion abnormalities  seen on prior no longer noted. Overall LV systolic function appears  similar.   Laboratory Data:  High Sensitivity Troponin:   Recent Labs  Lab 03/30/23 1757 03/30/23 1934 04/15/23 2158 04/15/23 2342  TROPONINIHS 4 5 5 6      Chemistry Recent Labs  Lab 04/15/23 2158 04/16/23 0652  NA 130* 135  K 4.0 4.0  CL 100 102  CO2 20* 23  GLUCOSE 302* 210*  BUN 28* 27*  CREATININE 1.94* 1.89*  CALCIUM 8.8* 8.8*  MG  --  1.7  GFRNONAA 29* 30*  ANIONGAP 10 10    Recent Labs  Lab 04/15/23 2158 04/16/23 0652  PROT 8.9* 8.4*  ALBUMIN  3.5 3.3*  AST 20 21  ALT 23 24  ALKPHOS 63 53  BILITOT 0.5 0.4   Lipids No results for input(s): "CHOL", "TRIG", "HDL", "LABVLDL", "LDLCALC", "CHOLHDL" in the last 168 hours.  Hematology Recent Labs  Lab 04/15/23 2158 04/16/23 0652  WBC 5.7 4.8  RBC 3.70* 3.62*  HGB 10.2* 9.8*  HCT 30.7* 30.4*  MCV 83.0 84.0  MCH 27.6 27.1  MCHC 33.2 32.2  RDW 16.2* 16.1*  PLT 191 178   Thyroid No results for input(s): "TSH", "FREET4" in the last 168 hours.  BNPNo results for input(s): "BNP", "PROBNP" in the last 168 hours.  DDimer No results for input(s): "DDIMER" in the last 168  hours.   Radiology/Studies:  CT CHEST ABDOMEN PELVIS WO CONTRAST  Result Date: 04/16/2023 CLINICAL DATA:  Sharp chest pain radiating to back. Left-sided rib pain and right-sided abdominal pain. EXAM: CT CHEST, ABDOMEN AND PELVIS WITHOUT CONTRAST TECHNIQUE: Multidetector CT imaging of the chest, abdomen and pelvis was performed following the standard protocol without IV contrast. RADIATION DOSE REDUCTION: This exam was performed according to the departmental dose-optimization program which includes automated exposure control, adjustment of the mA and/or kV according to patient size and/or use of iterative reconstruction technique. COMPARISON:  10/07/2022. FINDINGS: CT CHEST FINDINGS Cardiovascular: The heart is normal in size and there is no pericardial effusion. Three-vessel coronary artery calcifications are noted. There is atherosclerotic calcification of the aorta without evidence of aneurysm. The pulmonary trunk is normal in caliber. Mediastinum/Nodes: No mediastinal or hilar lymphadenopathy. Evaluation of the hila is limited due to lack of IV contrast. Coarse calcification is noted in the left lobe of the thyroid gland. The trachea and esophagus are within normal limits. Lungs/Pleura: Mild atelectasis is present bilaterally. No effusion or pneumothorax. Small calcified granuloma are present bilaterally. Musculoskeletal: Degenerative changes are present in the thoracic spine. No acute fracture is seen. CT ABDOMEN PELVIS FINDINGS Hepatobiliary: No focal liver abnormality is seen. The liver is mildly enlarged. No gallstones, gallbladder wall thickening, or biliary dilatation. Pancreas: Unremarkable. No pancreatic ductal dilatation or surrounding inflammatory changes. Spleen: The spleen is enlarged at 14.1 cm in length. No focal abnormality. Adrenals/Urinary Tract: The adrenal glands are within normal limits. A nonobstructive renal calculus is noted on the right. No hydroureteronephrosis bilaterally. The  bladder is unremarkable. Stomach/Bowel: Stomach is within normal limits. Appendix is surgically absent. No evidence of bowel wall thickening, distention, or inflammatory changes. No free air or pneumatosis. Vascular/Lymphatic: Aortic atherosclerosis. No enlarged abdominal or pelvic lymph nodes. Reproductive: Status post hysterectomy. No adnexal masses. Other: No abdominopelvic ascites. Subcutaneous fat stranding is noted in the anterior abdominal wall in the left lower quadrant, unchanged from the prior exam. Musculoskeletal: Degenerative changes are present in the lumbar spine. No acute fracture. IMPRESSION: 1. No acute process in the chest, abdomen, or pelvis. 2. Nonobstructive right renal calculus. 3. Mild hepatosplenomegaly. 4. Subcutaneous fat stranding in the anterior abdominal wall in the left lower quadrant, unchanged from the prior exam, possible cellulitis. 5. Coronary artery calcifications. 6. Aortic atherosclerosis. Electronically Signed   By: Thornell Sartorius M.D.   On: 04/16/2023 00:19   DG Chest 2 View  Result Date: 04/15/2023 CLINICAL DATA:  Left-sided chest pain for 2 days EXAM: CHEST - 2 VIEW COMPARISON:  03/30/2023 FINDINGS: Frontal and lateral views of the chest demonstrate an unremarkable cardiac silhouette. No airspace disease, effusion, or pneumothorax. No acute bony abnormality. IMPRESSION: 1. No acute intrathoracic process. Electronically Signed   By: Sharlet Salina  M.D.   On: 04/15/2023 22:33     Assessment and Plan:   1. Chest Pain with Atypical Features/Dyspnea on Exertion/Diaphoresis - Her episodes of chest pain have atypical qualities as they typically only last for a few seconds and spontaneously resolve. The most concerning part of her story is worsening dyspnea on exertion, diaphoresis and presyncope with activity over the past few months. Reports this does resemble her prior angina leading up to her MI in 2022. - She has ruled out for ACS and EKG shows ST elevation along aVR  which is similar to prior tracings and no acute diagnostic changes. Will obtain a repeat this morning. Will also obtain an echocardiogram to assess for any structural abnormalities. Will check a TSH given her episodes of significant diaphoresis.  - Will review with Dr. Wyline Mood but given her history of a prior false negative stress test prior to her MI in 04/2021 and given her concerning symptoms, anticipate she will likely require a cardiac catheterization for definitive evaluation. However, she does have an AKI and also has a contrast allergy (itching and swelling). Also has an allergy to Solu-Medrol so would likely use Benadryl. She is scheduled to be started on IV fluids today. Pending reassessment of kidney function, could consider arranging for a catheterization later this week. While she is listed as being on Eliquis PTA, she had been without this for 3 days prior to admission.   2. CAD - She is s/p NSTEMI with DES to mid-RCA in 04/2021 in [place] by review of Care Everywhere. May require repeat ischemic testing as discussed above.  - Continue Plavix 75mg  daily, Coreg 12.5mg  BID and Atorvastatin 40mg  daily. Will start ASA 81mg  daily for now.  3. Paroxysmal Atrial Fibrillation - She is in normal sinus rhythm currently. Continue Coreg 12.5 mg twice daily. - No reports of active bleeding. She is on Eliquis 5 mg twice daily for anticoagulation but has been without the medication for 3 days due to no refills. Currently held for now in case she requires invasive ischemic workup.  4. History of DVT - She was on Eliquis 5 mg twice daily for anticoagulation but has been without this for several days. Pending the timing of her cath if indicated this admission, may require bridging with Lovenox.  5. HLD - Will recheck an FLP. She has been continued on Atorvastatin 40mg  daily.   6. Type 2 DM - Glucose was elevated to 302 on admission. Hemoglobin A1c pending. Management per the admitting team.  7. Acute on  Chronic Stage 3 CKD - Baseline creatinine 1.2 - 1.4. Elevated to 1.94 on admission and at 1.89 today. She is scheduled to start IV fluids and will extend this for a longer duration as this was initially ordered for 4 hours.     For questions or updates, please contact [facility] Please consult [email or web address] for contact info under    Signed, Ellsworth Lennox, PA-C  04/16/2023 7:37 AM  Attending note Patient seen and discussed with PA Iran Ouch, I agree with her documentation. 62 yo female history of CAD with prior NSTEMI and DES to RCA 04/2021 in [place], HTN, HLD, DM2, DVT on eliquis, PAF presents with chest pain.  Reports several week history of exertional chest pain. Squeezing pain left sided with associated SOB,diaphoresis, nausea. Some times can feel lightheaded. Lasts a few minutes. Over time increasing in frequent and severity.   Last night slighltly different pain at rest while riding in car. Left sided sharp pain  8/10 in severity with +SOB, some chest pressure. Lasted about 5-10 minutes, radiated into left upper back    Na 130 Cr 1.94 (1.3 w months ago) BUN 28 K 4 WBC 5.7 Hgb 10.2 Plt 191 Lipase 47  Trop 5-->6 EKG SR, no acute ischemic changes CXR no acute process CT C/A/P: no acute process Echo pending   10/2021 echo [facility] Normal chamber sizes.   Concentric remodeling.   Normal left and right ventricular systolic function. LVEF 63% by biplane  method of disks. Normal left ventricular wall motion.   Normal left ventricular diastolic function.   No significant valvular regurgitation.   Unable to estimate right ventricular systolic pressure due to  insufficient tricuspid regurgitation Doppler signal.   Inferior vena cava is not well visualized.   Compared to echo dated 05/01/2021, regional wall motion abnormalities  seen on prior no longer noted. Overall LV systolic function appears  similar.   04/2021 cath [facility] Findings:  Coronary Arteries:  LM: Large,  with 20% lesion in the distal LM prior to trifurcation.  LAD: Large, wraps around the apex. Gives off a small to medium sized  diagonal Mariluz Crespo. There are two tandem tubular 40% stenoses in the proximal  and mid LAD.  Ramus: Large, bifurcates distally. 30-40% stenosis in the proximal to mid  vessel.  LCx: Large, non-dominant. Large OM1. No significant atherosclerosis.  RCA: Large, dominant. Gives off medium sized RPDA and RPLV branches.  Proximal stenosis with some haziness with 30-40% visual stenosis. 99%  stenosis in the mid RCA right before an acute marginal Saajan Willmon, with TIMI  2b flow distally. No change in stenosis or flow with intracoronary  nitroglycerin.   Conclusions:  1. Single vessel coronary artery disease  2 s/p successful PCI of the mid RCA with a 2.75 X 18 mm biodegradable  polymer drug-eluting (sirolimus) stent     Assessment/plan  1.CAD/chest pain history of CAD with prior NSTEMI and DES to RCA 04/2021 at [facility] in [place] - presents with chest pain. Several weeks of progressing exertional chest pain with DOE. Pain at rest yesterday was slightly different from her exertional symptoms. The combination of symptoms she reports is similar to when she had her MI in 2022 - no objective evidence of ischemia by EKG or enzymes, echo is pending - 5'3 245 lbs with bmi 43, stress imaging would be limited. She also reports prior history of false negative stress tests. With prior disease/stent poor candidate for coronary CTA - she will need a definitive cath once her renal function has improved.  - listed solumedrol allergy,  from Oct 06, 2022 ER note prior to CTA received solumedrol and benadryl without incident. Would need dye prep if cath pursued.   - medical therapy with ASA 81, coreg 12.5mg  bid, plavix 75 (has been on since last stent apparently), atorva 40. Start hep gtt due to recurrent chest pains today - if negative cath can d/c plavix, continue just eliquis.    2.Afib - in  SR on admit - eliquis on hold for possible cath   3. AKI on CKD - follow with IVFs  Dina Rich MD

## 2023-04-16 NOTE — Progress Notes (Addendum)
ANTICOAGULATION CONSULT NOTE - Initial Consult  Pharmacy Consult for Heparin Indication: chest pain/ACS  Allergies  Allergen Reactions   Solu-Medrol [Methylprednisolone Sodium Succ] Shortness Of Breath   Contrast Media [Iodinated Contrast Media] Itching and Swelling   Doxycycline Hives   Ibuprofen Other (See Comments)    GI upset   Keflex [Cephalexin] Nausea And Vomiting   Metformin And Related Nausea Only   Nitroglycerin Nausea And Vomiting   Nsaids Other (See Comments)    Stomach pain/bleeding   Shellfish Allergy Itching and Swelling   Sulfa Antibiotics Nausea And Vomiting   Toradol [Ketorolac Tromethamine] Hives   Bentyl [Dicyclomine Hcl] Anxiety   Blueberry Flavor Nausea And Vomiting and Rash   Nitrofurantoin Monohyd Macro Itching   Strawberry Flavor Nausea And Vomiting and Rash    Patient Measurements: Height: 5\' 3"  (160 cm) Weight: 111.1 kg (245 lb) IBW/kg (Calculated) : 52.4 HEPARIN DW (KG): 79.2   Vital Signs: Temp: 98.4 F (36.9 C) (07/31 1343) Temp Source: Oral (07/31 1125) BP: 139/58 (07/31 1343) Pulse Rate: 68 (07/31 1343)  Labs: Recent Labs    04/15/23 2158 04/15/23 2342 04/16/23 0652  HGB 10.2*  --  9.8*  HCT 30.7*  --  30.4*  PLT 191  --  178  CREATININE 1.94*  --  1.89*  TROPONINIHS 5 6  --     Estimated Creatinine Clearance: 37.9 mL/min (A) (by C-G formula based on SCr of 1.89 mg/dL (H)).   Medical History: Past Medical History:  Diagnosis Date   Asthma    Atrial fibrillation (HCC)    Bipolar 1 disorder (HCC)    Chronic back pain    Chronic chest pain    Diabetes mellitus without complication (HCC)    Gastroparesis    GERD (gastroesophageal reflux disease)    Hyperlipemia    Hypertension    IBS (irritable bowel syndrome)    Normal cardiac stress test 02/2014   [facility]    Medications:  Medications Prior to Admission  Medication Sig Dispense Refill Last Dose   acetaminophen (TYLENOL) 325 MG tablet Take by mouth.    unknown   albuterol (PROAIR HFA) 108 (90 BASE) MCG/ACT inhaler Inhale 1-2 puffs into the lungs every 6 (six) hours as needed for wheezing or shortness of breath.   unknown   apixaban (ELIQUIS) 5 MG TABS tablet Take 1 tablet (5 mg total) by mouth 2 (two) times daily. 60 tablet 0 04/12/2023 at AM   atorvastatin (LIPITOR) 40 MG tablet Take 40 mg by mouth every morning.   04/15/2023   carvedilol (COREG) 25 MG tablet Take 1 tablet (25 mg total) by mouth 2 (two) times daily with a meal. 30 tablet 0 04/15/2023   clopidogrel (PLAVIX) 75 MG tablet Take 75 mg by mouth daily.   04/15/2023   diphenhydrAMINE (BANOPHEN) 25 mg capsule Take 25 mg by mouth at bedtime as needed for sleep.   Past Week at AM   gabapentin (NEURONTIN) 300 MG capsule Take 300 mg by mouth 3 (three) times daily.   Past Month   insulin detemir (LEVEMIR) 100 UNIT/ML injection Inject 50 Units into the skin 2 (two) times daily.   04/15/2023   isosorbide mononitrate (IMDUR) 30 MG 24 hr tablet Take 30 mg by mouth every morning.   04/15/2023   lamoTRIgine (LAMICTAL) 100 MG tablet Take 100 mg by mouth 2 (two) times daily.   04/15/2023   NOVOLOG FLEXPEN 100 UNIT/ML FlexPen Inject 20 Units into the skin 4 (four) times daily.  2 04/15/2023   sertraline (ZOLOFT) 100 MG tablet Take 100 mg by mouth daily.   04/15/2023   hydrOXYzine (ATARAX) 25 MG tablet Take 1 tablet (25 mg total) by mouth every 6 (six) hours as needed for anxiety. (Patient not taking: Reported on 04/16/2023) 30 tablet 0 Not Taking    Assessment: Patient present with chest pain with atypical features, Dyspnea on exertion and diaphoresis. 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 Will check baseline APTT and HL. Cardiology plans to do cath and bridging with heparin.   Goal of Therapy:  Heparin level 0.3-0.7 units/ml aPTT 66-102 seconds Monitor platelets by anticoagulation protocol: Yes   Plan:  Give 4000 units bolus x 1 Start  heparin infusion at 1000 units/hr Check APTT and  anti-Xa level in ~6-8 hours and daily while on heparin Continue to monitor H&H and platelets  Elder Cyphers, BS Pharm D, BCPS Clinical Pharmacist 04/16/2023,3:30 PM

## 2023-04-16 NOTE — Progress Notes (Signed)
ASSUMPTION OF CARE NOTE   04/16/2023 5:29 PM  [name] was seen and examined.  The H&P by the admitting provider, orders, imaging was reviewed.  Please see new orders.  Will continue to follow.   Vitals:   04/16/23 1300 04/16/23 1343  BP: (!) 142/60 (!) 139/58  Pulse: 68 68  Resp: 18 17  Temp:  98.4 F (36.9 C)  SpO2: 100% 100%    Results for orders placed or performed during the hospital encounter of 04/15/23  Basic metabolic panel  Result Value Ref Range   Sodium 130 (L) 135 - 145 mmol/L   Potassium 4.0 3.5 - 5.1 mmol/L   Chloride 100 98 - 111 mmol/L   CO2 20 (L) 22 - 32 mmol/L   Glucose, Bld 302 (H) 70 - 99 mg/dL   BUN 28 (H) 6 - 20 mg/dL   Creatinine, Ser 1.66 (H) 0.44 - 1.00 mg/dL   Calcium 8.8 (L) 8.9 - 10.3 mg/dL   GFR, Estimated 29 (L) >60 mL/min   Anion gap 10 5 - 15  CBC  Result Value Ref Range   WBC 5.7 4.0 - 10.5 K/uL   RBC 3.70 (L) 3.87 - 5.11 MIL/uL   Hemoglobin 10.2 (L) 12.0 - 15.0 g/dL   HCT 06.3 (L) 01.6 - 01.0 %   MCV 83.0 80.0 - 100.0 fL   MCH 27.6 26.0 - 34.0 pg   MCHC 33.2 30.0 - 36.0 g/dL   RDW 93.2 (H) 35.5 - 73.2 %   Platelets 191 150 - 400 K/uL   nRBC 0.0 0.0 - 0.2 %  Lipase, blood  Result Value Ref Range   Lipase 47 11 - 51 U/L  Hepatic function panel  Result Value Ref Range   Total Protein 8.9 (H) 6.5 - 8.1 g/dL   Albumin 3.5 3.5 - 5.0 g/dL   AST 20 15 - 41 U/L   ALT 23 0 - 44 U/L   Alkaline Phosphatase 63 38 - 126 U/L   Total Bilirubin 0.5 0.3 - 1.2 mg/dL   Bilirubin, Direct <2.0 0.0 - 0.2 mg/dL   Indirect Bilirubin NOT CALCULATED 0.3 - 0.9 mg/dL  HIV Antibody (routine testing w rflx)  Result Value Ref Range   HIV Screen 4th Generation wRfx Non Reactive Non Reactive  Comprehensive metabolic panel  Result Value Ref Range   Sodium 135 135 - 145 mmol/L   Potassium 4.0 3.5 - 5.1 mmol/L   Chloride 102 98 - 111 mmol/L   CO2 23 22 - 32 mmol/L   Glucose, Bld 210 (H) 70 - 99 mg/dL   BUN 27 (H) 6 - 20 mg/dL   Creatinine, Ser  2.54 (H) 0.44 - 1.00 mg/dL   Calcium 8.8 (L) 8.9 - 10.3 mg/dL   Total Protein 8.4 (H) 6.5 - 8.1 g/dL   Albumin 3.3 (L) 3.5 - 5.0 g/dL   AST 21 15 - 41 U/L   ALT 24 0 - 44 U/L   Alkaline Phosphatase 53 38 - 126 U/L   Total Bilirubin 0.4 0.3 - 1.2 mg/dL   GFR, Estimated 30 (L) >60 mL/min   Anion gap 10 5 - 15  CBC  Result Value Ref Range   WBC 4.8 4.0 - 10.5 K/uL   RBC 3.62 (L) 3.87 - 5.11 MIL/uL   Hemoglobin 9.8 (L) 12.0 - 15.0 g/dL   HCT 27.0 (L) 62.3 - 76.2 %   MCV 84.0 80.0 - 100.0 fL   MCH 27.1 26.0 - 34.0  pg   MCHC 32.2 30.0 - 36.0 g/dL   RDW 78.4 (H) 69.6 - 29.5 %   Platelets 178 150 - 400 K/uL   nRBC 0.0 0.0 - 0.2 %  Magnesium  Result Value Ref Range   Magnesium 1.7 1.7 - 2.4 mg/dL  Phosphorus  Result Value Ref Range   Phosphorus 4.8 (H) 2.5 - 4.6 mg/dL  TSH  Result Value Ref Range   TSH 5.975 (H) 0.350 - 4.500 uIU/mL  Heparin level (unfractionated)  Result Value Ref Range   Heparin Unfractionated <0.10 (L) 0.30 - 0.70 IU/mL  APTT  Result Value Ref Range   aPTT 28 24 - 36 seconds  Glucose, capillary  Result Value Ref Range   Glucose-Capillary 215 (H) 70 - 99 mg/dL  CBG monitoring, ED  Result Value Ref Range   Glucose-Capillary 214 (H) 70 - 99 mg/dL  CBG monitoring, ED  Result Value Ref Range   Glucose-Capillary 232 (H) 70 - 99 mg/dL  Troponin I (High Sensitivity)  Result Value Ref Range   Troponin I (High Sensitivity) 5 <18 ng/L  Troponin I (High Sensitivity)  Result Value Ref Range   Troponin I (High Sensitivity) 6 <18 ng/L     C. Laural Benes, MD [facility]   04/15/2023  9:42 PM How to contact the [facility] Attending or Consulting provider 7A - 7P or covering provider during after hours 7P -7A, for this patient?  Check the care team in [facility] and look for a) attending/consulting TR[facility] provider listed and b) the [facility] team listed Log into [email or web address] and use [place]'s universal password to access. If you do not have the password, please contact the  hospital operator. Locate the [facility] provider you are looking for under [facility] and page to a number that you can be directly reached. If you still have difficulty reaching the provider, please page the [facility] (Director on Call) for the Hospitalists listed on amion for assistance.

## 2023-04-16 NOTE — ED Notes (Signed)
ED TO INPATIENT HANDOFF REPORT  ED Nurse Name and Phone #: Trisha Morandi [phone]  S Name/Age/Gender [name] 62 y.o. female Room/Bed: APA12/APA12  Code Status   Code Status: Full Code  Home/SNF/Other Home Patient oriented to: self, place, time, and situation Is this baseline? Yes   Triage Complete: Triage complete  Chief Complaint Atypical chest pain [R07.89]  Triage Note Pt reports she started having left side chest pain that radiated into her back this evening.  Pt came to ER yesterday for left side rib pain and right side abd pain but left after triage due to a long wait.  Pt reports she is still having those pains as well.   Allergies Allergies  Allergen Reactions   Solu-Medrol [Methylprednisolone Sodium Succ] Shortness Of Breath   Contrast Media [Iodinated Contrast Media] Itching and Swelling   Doxycycline Hives   Ibuprofen Other (See Comments)    GI upset   Keflex [Cephalexin] Nausea And Vomiting   Metformin And Related Nausea Only   Nitroglycerin Nausea And Vomiting   Nsaids Other (See Comments)    Stomach pain/bleeding   Shellfish Allergy Itching and Swelling   Sulfa Antibiotics Nausea And Vomiting   Toradol [Ketorolac Tromethamine] Hives   Bentyl [Dicyclomine Hcl] Anxiety   Blueberry Flavor Nausea And Vomiting and Rash   Nitrofurantoin Monohyd Macro Itching   Strawberry Flavor Nausea And Vomiting and Rash    Level of Care/Admitting Diagnosis ED Disposition     ED Disposition  Admit   Condition  --   Comment  [facility] [[id number]]  Level of Care: Telemetry [5]  Covid Evaluation: Asymptomatic - no recent exposure (last 10 days) testing not required  Diagnosis: Atypical chest pain [297556]  Admitting Physician: Frankey Shown [[id number]]  Attending Physician: Frankey Shown [[id number]]          B Medical/Surgery History Past Medical History:  Diagnosis Date   Asthma    Atrial fibrillation (HCC)    Bipolar 1 disorder  (HCC)    Chronic back pain    Chronic chest pain    Diabetes mellitus without complication (HCC)    Gastroparesis    GERD (gastroesophageal reflux disease)    Hyperlipemia    Hypertension    IBS (irritable bowel syndrome)    Normal cardiac stress test 02/2014   [facility]   Past Surgical History:  Procedure Laterality Date   ABDOMINAL HYSTERECTOMY  2001   APPENDECTOMY  10/2005   CHOLECYSTECTOMY  10/2005   COLONOSCOPY  2007   Patel ([name])-pt reports hemorrhoids   ESOPHAGOGASTRODUODENOSCOPY  02/28/2006   [name], normal on daily PPI, myultiple hyperplastic polyps   MOUTH SURGERY     RIGHT OOPHORECTOMY  1999     A IV Location/Drains/Wounds Patient Lines/Drains/Airways Status     Active Line/Drains/Airways     Name Placement date Placement time Site Days   Peripheral IV 04/15/23 20 G Anterior;Left Forearm 04/15/23  2154  Forearm  1            Intake/Output Last 24 hours No intake or output data in the 24 hours ending 04/16/23 1245  Labs/Imaging Results for orders placed or performed during the hospital encounter of 04/15/23 (from the past 48 hour(s))  Basic metabolic panel     Status: Abnormal   Collection Time: 04/15/23  9:58 PM  Result Value Ref Range   Sodium 130 (L) 135 - 145 mmol/L   Potassium 4.0 3.5 - 5.1 mmol/L   Chloride 100 98 -  111 mmol/L   CO2 20 (L) 22 - 32 mmol/L   Glucose, Bld 302 (H) 70 - 99 mg/dL    Comment: Glucose reference range applies only to samples taken after fasting for at least 8 hours.   BUN 28 (H) 6 - 20 mg/dL   Creatinine, Ser 1.61 (H) 0.44 - 1.00 mg/dL   Calcium 8.8 (L) 8.9 - 10.3 mg/dL   GFR, Estimated 29 (L) >60 mL/min    Comment: (NOTE) Calculated using the CKD-EPI Creatinine Equation (2021)    Anion gap 10 5 - 15    Comment: Performed at [facility], [address] [place], [place] [address]  CBC     Status: Abnormal   Collection Time: 04/15/23  9:58 PM  Result Value Ref Range   WBC 5.7 4.0 - 10.5 K/uL   RBC  3.70 (L) 3.87 - 5.11 MIL/uL   Hemoglobin 10.2 (L) 12.0 - 15.0 g/dL   HCT 54.0 (L) 98.1 - 19.1 %   MCV 83.0 80.0 - 100.0 fL   MCH 27.6 26.0 - 34.0 pg   MCHC 33.2 30.0 - 36.0 g/dL   RDW 47.8 (H) 29.5 - 62.1 %   Platelets 191 150 - 400 K/uL   nRBC 0.0 0.0 - 0.2 %    Comment: Performed at [facility], [address] [place], [place] [address]  Troponin I (High Sensitivity)     Status: None   Collection Time: 04/15/23  9:58 PM  Result Value Ref Range   Troponin I (High Sensitivity) 5 <18 ng/L    Comment: (NOTE) Elevated high sensitivity troponin I (hsTnI) values and significant  changes across serial measurements may suggest ACS but many other  chronic and acute conditions are known to elevate hsTnI results.  Refer to the "Links" section for chest pain algorithms and additional  guidance. Performed at [facility], [address] [place], [place] [address]   Lipase, blood     Status: None   Collection Time: 04/15/23  9:58 PM  Result Value Ref Range   Lipase 47 11 - 51 U/L    Comment: Performed at [facility], [address], [place], [place] [address]  Hepatic function panel     Status: Abnormal   Collection Time: 04/15/23  9:58 PM  Result Value Ref Range   Total Protein 8.9 (H) 6.5 - 8.1 g/dL   Albumin 3.5 3.5 - 5.0 g/dL   AST 20 15 - 41 U/L   ALT 23 0 - 44 U/L   Alkaline Phosphatase 63 38 - 126 U/L   Total Bilirubin 0.5 0.3 - 1.2 mg/dL   Bilirubin, Direct <8.4 0.0 - 0.2 mg/dL   Indirect Bilirubin NOT CALCULATED 0.3 - 0.9 mg/dL    Comment: Performed at [facility], [address], [place], [place] [address]  Troponin I (High Sensitivity)     Status: None   Collection Time: 04/15/23 11:42 PM  Result Value Ref Range   Troponin I (High Sensitivity) 6 <18 ng/L    Comment: (NOTE) Elevated high sensitivity troponin I (hsTnI) values and significant  changes across serial measurements may suggest ACS but many other  chronic and acute conditions are known to elevate hsTnI results.   Refer to the "Links" section for chest pain algorithms and additional  guidance. Performed at [facility], [address], [place], [place] [address]   Comprehensive metabolic panel     Status: Abnormal   Collection Time: 04/16/23  6:52 AM  Result Value Ref Range  Sodium 135 135 - 145 mmol/L   Potassium 4.0 3.5 - 5.1 mmol/L   Chloride 102 98 - 111 mmol/L   CO2 23 22 - 32 mmol/L   Glucose, Bld 210 (H) 70 - 99 mg/dL    Comment: Glucose reference range applies only to samples taken after fasting for at least 8 hours.   BUN 27 (H) 6 - 20 mg/dL   Creatinine, Ser 0.10 (H) 0.44 - 1.00 mg/dL   Calcium 8.8 (L) 8.9 - 10.3 mg/dL   Total Protein 8.4 (H) 6.5 - 8.1 g/dL   Albumin 3.3 (L) 3.5 - 5.0 g/dL   AST 21 15 - 41 U/L   ALT 24 0 - 44 U/L   Alkaline Phosphatase 53 38 - 126 U/L   Total Bilirubin 0.4 0.3 - 1.2 mg/dL   GFR, Estimated 30 (L) >60 mL/min    Comment: (NOTE) Calculated using the CKD-EPI Creatinine Equation (2021)    Anion gap 10 5 - 15    Comment: Performed at [facility], [address], [place], [place] [address]  CBC     Status: Abnormal   Collection Time: 04/16/23  6:52 AM  Result Value Ref Range   WBC 4.8 4.0 - 10.5 K/uL   RBC 3.62 (L) 3.87 - 5.11 MIL/uL   Hemoglobin 9.8 (L) 12.0 - 15.0 g/dL   HCT 57.3 (L) 22.0 - 25.4 %   MCV 84.0 80.0 - 100.0 fL   MCH 27.1 26.0 - 34.0 pg   MCHC 32.2 30.0 - 36.0 g/dL   RDW 27.0 (H) 62.3 - 76.2 %   Platelets 178 150 - 400 K/uL   nRBC 0.0 0.0 - 0.2 %    Comment: Performed at [facility], [address] [place], [place] [address]  Magnesium     Status: None   Collection Time: 04/16/23  6:52 AM  Result Value Ref Range   Magnesium 1.7 1.7 - 2.4 mg/dL    Comment: Performed at [facility], [address] [place], [place] [address]  Phosphorus     Status: Abnormal   Collection Time: 04/16/23  6:52 AM  Result Value Ref Range   Phosphorus 4.8 (H) 2.5 - 4.6 mg/dL    Comment: Performed at [facility], [address] [place],  [place] [address]  TSH     Status: Abnormal   Collection Time: 04/16/23  6:52 AM  Result Value Ref Range   TSH 5.975 (H) 0.350 - 4.500 uIU/mL    Comment: Performed by a 3rd Generation assay with a functional sensitivity of <=0.01 uIU/mL. Performed at [facility], [address], [place], [place] [address]   CBG monitoring, ED     Status: Abnormal   Collection Time: 04/16/23  8:00 AM  Result Value Ref Range   Glucose-Capillary 214 (H) 70 - 99 mg/dL    Comment: Glucose reference range applies only to samples taken after fasting for at least 8 hours.  CBG monitoring, ED     Status: Abnormal   Collection Time: 04/16/23 12:19 PM  Result Value Ref Range   Glucose-Capillary 232 (H) 70 - 99 mg/dL    Comment: Glucose reference range applies only to samples taken after fasting for at least 8 hours.   CT CHEST ABDOMEN PELVIS WO CONTRAST  Result Date: 04/16/2023 CLINICAL DATA:  Sharp chest pain radiating to back. Left-sided rib pain and right-sided abdominal pain. EXAM: CT CHEST, ABDOMEN AND PELVIS WITHOUT CONTRAST TECHNIQUE: Multidetector CT imaging of the chest, abdomen and pelvis  was performed following the standard protocol without IV contrast. RADIATION DOSE REDUCTION: This exam was performed according to the departmental dose-optimization program which includes automated exposure control, adjustment of the mA and/or kV according to patient size and/or use of iterative reconstruction technique. COMPARISON:  10/07/2022. FINDINGS: CT CHEST FINDINGS Cardiovascular: The heart is normal in size and there is no pericardial effusion. Three-vessel coronary artery calcifications are noted. There is atherosclerotic calcification of the aorta without evidence of aneurysm. The pulmonary trunk is normal in caliber. Mediastinum/Nodes: No mediastinal or hilar lymphadenopathy. Evaluation of the hila is limited due to lack of IV contrast. Coarse calcification is noted in the left lobe of the thyroid gland. The trachea and  esophagus are within normal limits. Lungs/Pleura: Mild atelectasis is present bilaterally. No effusion or pneumothorax. Small calcified granuloma are present bilaterally. Musculoskeletal: Degenerative changes are present in the thoracic spine. No acute fracture is seen. CT ABDOMEN PELVIS FINDINGS Hepatobiliary: No focal liver abnormality is seen. The liver is mildly enlarged. No gallstones, gallbladder wall thickening, or biliary dilatation. Pancreas: Unremarkable. No pancreatic ductal dilatation or surrounding inflammatory changes. Spleen: The spleen is enlarged at 14.1 cm in length. No focal abnormality. Adrenals/Urinary Tract: The adrenal glands are within normal limits. A nonobstructive renal calculus is noted on the right. No hydroureteronephrosis bilaterally. The bladder is unremarkable. Stomach/Bowel: Stomach is within normal limits. Appendix is surgically absent. No evidence of bowel wall thickening, distention, or inflammatory changes. No free air or pneumatosis. Vascular/Lymphatic: Aortic atherosclerosis. No enlarged abdominal or pelvic lymph nodes. Reproductive: Status post hysterectomy. No adnexal masses. Other: No abdominopelvic ascites. Subcutaneous fat stranding is noted in the anterior abdominal wall in the left lower quadrant, unchanged from the prior exam. Musculoskeletal: Degenerative changes are present in the lumbar spine. No acute fracture. IMPRESSION: 1. No acute process in the chest, abdomen, or pelvis. 2. Nonobstructive right renal calculus. 3. Mild hepatosplenomegaly. 4. Subcutaneous fat stranding in the anterior abdominal wall in the left lower quadrant, unchanged from the prior exam, possible cellulitis. 5. Coronary artery calcifications. 6. Aortic atherosclerosis. Electronically Signed   By: Thornell Sartorius M.D.   On: 04/16/2023 00:19   DG Chest 2 View  Result Date: 04/15/2023 CLINICAL DATA:  Left-sided chest pain for 2 days EXAM: CHEST - 2 VIEW COMPARISON:  03/30/2023 FINDINGS:  Frontal and lateral views of the chest demonstrate an unremarkable cardiac silhouette. No airspace disease, effusion, or pneumothorax. No acute bony abnormality. IMPRESSION: 1. No acute intrathoracic process. Electronically Signed   By: Sharlet Salina M.D.   On: 04/15/2023 22:33    Pending Labs Unresulted Labs (From admission, onward)     Start     Ordered   04/17/23 0500  Lipid panel  Tomorrow morning,   R        04/16/23 0838   04/17/23 0500  Basic metabolic panel  Daily,   R      04/16/23 0951   04/17/23 0500  CBC  Daily,   R      04/16/23 0951   04/17/23 0500  Magnesium  Tomorrow morning,   R        04/16/23 0951   04/16/23 0953  T4, free  Add-on,   AD        04/16/23 0953   04/16/23 0625  HIV Antibody (routine testing w rflx)  (HIV Antibody (Routine testing w reflex) panel)  Once,   R        04/16/23 0628   04/16/23 0622  Hemoglobin A1c  Once,   R       Comments: To assess prior glycemic control    04/16/23 0621            Vitals/Pain Today's Vitals   04/16/23 1125 04/16/23 1146 04/16/23 1223 04/16/23 1225  BP:    (!) 113/44  Pulse:    69  Resp:    20  Temp: 97.6 F (36.4 C)     TempSrc: Oral     SpO2:    97%  Weight:      Height:      PainSc:  8  0-No pain     Isolation Precautions No active isolations  Medications Medications  insulin aspart (novoLOG) injection 0-15 Units (5 Units Subcutaneous Given 04/16/23 0836)  acetaminophen (TYLENOL) tablet 650 mg (650 mg Oral Given 04/16/23 0757)    Or  acetaminophen (TYLENOL) suppository 650 mg ( Rectal See Alternative 04/16/23 0757)  ondansetron (ZOFRAN) tablet 4 mg ( Oral See Alternative 04/16/23 0911)    Or  ondansetron (ZOFRAN) injection 4 mg (4 mg Intravenous Given 04/16/23 0911)  atorvastatin (LIPITOR) tablet 40 mg (40 mg Oral Given 04/16/23 0905)  clopidogrel (PLAVIX) tablet 75 mg (75 mg Oral Given 04/16/23 0905)  carvedilol (COREG) tablet 12.5 mg (12.5 mg Oral Given 04/16/23 0905)  0.9 %  sodium chloride  infusion ( Intravenous New Bag/Given 04/16/23 0905)  aspirin EC tablet 81 mg (81 mg Oral Given 04/16/23 0905)  fentaNYL (SUBLIMAZE) injection 25 mcg (25 mcg Intravenous Given 04/16/23 1145)  oxyCODONE (Oxy IR/ROXICODONE) immediate release tablet 5 mg (has no administration in time range)  ondansetron (ZOFRAN) injection 4 mg (4 mg Intravenous Given 04/15/23 2311)  morphine (PF) 4 MG/ML injection 2 mg (2 mg Intravenous Given 04/15/23 2311)  morphine (PF) 4 MG/ML injection 4 mg (4 mg Intravenous Given 04/16/23 0218)  ondansetron (ZOFRAN) injection 4 mg (4 mg Intravenous Given 04/16/23 0218)    Mobility walks     Focused Assessments Cardiac Assessment Handoff:  Cardiac Rhythm: Normal sinus rhythm Lab Results  Component Value Date   CKTOTAL 114 12/24/2011   CKMB 1.6 12/24/2011   TROPONINI <0.03 05/29/2018   Lab Results  Component Value Date   DDIMER 0.28 01/18/2023   Does the Patient currently have chest pain? Yes    R Recommendations: See Admitting Provider Note  Report given to:   Additional Notes:

## 2023-04-16 NOTE — H&P (Signed)
History and Physical    Patient: [name]:[id number] DOB: 01/01/1961 DOA: 04/15/2023 DOS: the patient was seen and examined on 04/16/2023 PCP: Patient, No Pcp Per  Patient coming from: Home  Chief Complaint:  Chief Complaint  Patient presents with   Chest Pain   HPI: [name] is a 62 y.o. female with medical history significant of CAD s/p PCI, hypertension, hyperlipidemia, T2DM, DVT on Eliquis who presents to the emergency department with complaints of chest pain.  Patient complained of left-sided chest pain which occurred yesterday while sitting in a car chest pain was described as sharp and nonreproducible and was rated as 8/10 on pain scale with radiation to the back, this was associated with dizziness and nausea without vomiting.  She states that she called her PCP who advised her to go to the ED for further evaluation and management.  Patient denies shortness of breath, diaphoresis, fever.  ED Course:  In the emergency department, BP was 155/81, other vital signs were within normal range.  Workup in the ED shows normocytic anemia.  BMP showed sodium 130, potassium 4.0, chloride 100, bicarb 20, blood glucose 302, BUN 28, creatinine 1.94 (baseline creatinine at 1.1-1.4), EGFR 29.  Troponin x 2 was negative, lipase was 47. CT chest, abdomen and pelvis without contrast showed no acute process in the chest, abdomen or pelvis.  She was treated with IV morphine, Zofran was given. Hospitalist was asked to admit patient for further evaluation and management.  Review of Systems: Review of systems as noted in the HPI. All other systems reviewed and are negative.   Past Medical History:  Diagnosis Date   Asthma    Atrial fibrillation (HCC)    Bipolar 1 disorder (HCC)    Chronic back pain    Chronic chest pain    Diabetes mellitus without complication (HCC)    Gastroparesis    GERD (gastroesophageal reflux disease)    Hyperlipemia    Hypertension    IBS (irritable bowel  syndrome)    Normal cardiac stress test 02/2014   [facility]   Past Surgical History:  Procedure Laterality Date   ABDOMINAL HYSTERECTOMY  2001   APPENDECTOMY  10/2005   CHOLECYSTECTOMY  10/2005   COLONOSCOPY  2007   Patel ([name])-pt reports hemorrhoids   ESOPHAGOGASTRODUODENOSCOPY  02/28/2006   [name], normal on daily PPI, myultiple hyperplastic polyps   MOUTH SURGERY     RIGHT OOPHORECTOMY  1999    Social History:  reports that she is a non-smoker but has been exposed to tobacco smoke. She has never used smokeless tobacco. She reports that she does not drink alcohol and does not use drugs.   Allergies  Allergen Reactions   Solu-Medrol [Methylprednisolone Sodium Succ] Shortness Of Breath   Contrast Media [Iodinated Contrast Media] Itching and Swelling   Doxycycline Hives   Ibuprofen Other (See Comments)    GI upset   Keflex [Cephalexin] Nausea And Vomiting   Metformin And Related Nausea Only   Nitroglycerin Nausea And Vomiting   Nsaids Other (See Comments)    Stomach pain/bleeding   Shellfish Allergy Itching and Swelling   Sulfa Antibiotics Nausea And Vomiting   Toradol [Ketorolac Tromethamine] Hives   Bentyl [Dicyclomine Hcl] Anxiety   Blueberry Flavor Nausea And Vomiting and Rash   Nitrofurantoin Monohyd Macro Itching   Strawberry Flavor Nausea And Vomiting and Rash    Family History  Problem Relation Age of Onset   Cirrhosis Mother 57       ?  etiology   Cirrhosis Father        ?etiology   Cirrhosis Sister 55       ?etiology   Diverticulitis Sister      Prior to Admission medications   Medication Sig Start Date End Date Taking? Authorizing Provider  acetaminophen (TYLENOL) 325 MG tablet Take by mouth. 05/01/21   [provider]  albuterol (PROAIR HFA) 108 (90 BASE) MCG/ACT inhaler Inhale 1-2 puffs into the lungs every 6 (six) hours as needed for wheezing or shortness of breath.    [provider]  apixaban (ELIQUIS) 5 MG TABS tablet  Take 1 tablet (5 mg total) by mouth 2 (two) times daily. 11/17/17   Bethann Berkshire, MD  atorvastatin (LIPITOR) 40 MG tablet Take 40 mg by mouth every morning.    [provider]  carvedilol (COREG) 25 MG tablet Take 1 tablet (25 mg total) by mouth 2 (two) times daily with a meal. 01/21/23   Gerhard Munch, MD  clopidogrel (PLAVIX) 75 MG tablet Take 75 mg by mouth daily.    [provider]  diphenhydrAMINE (BANOPHEN) 25 mg capsule Take 25 mg by mouth at bedtime as needed for sleep. 04/25/21   [provider]  gabapentin (NEURONTIN) 300 MG capsule Take 300 mg by mouth 3 (three) times daily.    [provider]  hydrOXYzine (ATARAX) 25 MG tablet Take 1 tablet (25 mg total) by mouth every 6 (six) hours as needed for anxiety. 10/07/22   Sabas Sous, MD  insulin detemir (LEVEMIR) 100 UNIT/ML injection Inject 50 Units into the skin 2 (two) times daily.    [provider]  lamoTRIgine (LAMICTAL) 100 MG tablet Take 100 mg by mouth 2 (two) times daily. 12/19/22   [provider]  NOVOLOG FLEXPEN 100 UNIT/ML FlexPen Inject 20 Units into the skin 4 (four) times daily.  05/13/15   [provider]  ondansetron (ZOFRAN) 8 MG tablet Take 8 mg by mouth every 8 (eight) hours as needed for nausea or vomiting.  05/22/18   [provider]  ondansetron (ZOFRAN-ODT) 4 MG disintegrating tablet Take 8 mg by mouth 2 (two) times daily as needed for vomiting or nausea. 11/09/21   [provider]  sertraline (ZOLOFT) 100 MG tablet Take 100 mg by mouth daily.    [provider]    Physical Exam: BP 132/85   Pulse 81   Temp 98 F (36.7 C) (Oral)   Resp 18   Ht 5\' 3"  (1.6 m)   Wt 111.1 kg   SpO2 97%   BMI 43.40 kg/m   General: 62 y.o. year-old female well developed well nourished in no acute distress.  Alert and oriented x3. HEENT: NCAT, EOMI Neck: Supple, trachea medial Cardiovascular: Regular rate and rhythm with no rubs or gallops.   No thyromegaly or JVD noted.  No lower extremity edema. 2/4 pulses in all 4 extremities. Respiratory: Clear to auscultation with no wheezes or rales. Good inspiratory effort. Abdomen: Soft, nontender nondistended with normal bowel sounds x4 quadrants. Muskuloskeletal: No cyanosis, clubbing or edema noted bilaterally Neuro: CN II-XII intact, strength 5/5 x 4, sensation, reflexes intact Skin: No ulcerative lesions noted or rashes Psychiatry: Judgement and insight appear normal. Mood is appropriate for condition and setting          Labs on Admission:  Basic Metabolic Panel: Recent Labs  Lab 04/15/23 2158  NA 130*  K 4.0  CL 100  CO2 20*  GLUCOSE 302*  BUN  28*  CREATININE 1.94*  CALCIUM 8.8*   Liver Function Tests: Recent Labs  Lab 04/15/23 2158  AST 20  ALT 23  ALKPHOS 63  BILITOT 0.5  PROT 8.9*  ALBUMIN 3.5   Recent Labs  Lab 04/15/23 2158  LIPASE 47   No results for input(s): "AMMONIA" in the last 168 hours. CBC: Recent Labs  Lab 04/15/23 2158  WBC 5.7  HGB 10.2*  HCT 30.7*  MCV 83.0  PLT 191   Cardiac Enzymes: No results for input(s): "CKTOTAL", "CKMB", "CKMBINDEX", "TROPONINI" in the last 168 hours.  BNP (last 3 results) Recent Labs    01/21/23 1225  BNP 54.0    ProBNP (last 3 results) No results for input(s): "PROBNP" in the last 8760 hours.  CBG: No results for input(s): "GLUCAP" in the last 168 hours.  Radiological Exams on Admission: CT CHEST ABDOMEN PELVIS WO CONTRAST  Result Date: 04/16/2023 CLINICAL DATA:  Sharp chest pain radiating to back. Left-sided rib pain and right-sided abdominal pain. EXAM: CT CHEST, ABDOMEN AND PELVIS WITHOUT CONTRAST TECHNIQUE: Multidetector CT imaging of the chest, abdomen and pelvis was performed following the standard protocol without IV contrast. RADIATION DOSE REDUCTION: This exam was performed according to the departmental dose-optimization program which includes automated exposure control, adjustment of  the mA and/or kV according to patient size and/or use of iterative reconstruction technique. COMPARISON:  10/07/2022. FINDINGS: CT CHEST FINDINGS Cardiovascular: The heart is normal in size and there is no pericardial effusion. Three-vessel coronary artery calcifications are noted. There is atherosclerotic calcification of the aorta without evidence of aneurysm. The pulmonary trunk is normal in caliber. Mediastinum/Nodes: No mediastinal or hilar lymphadenopathy. Evaluation of the hila is limited due to lack of IV contrast. Coarse calcification is noted in the left lobe of the thyroid gland. The trachea and esophagus are within normal limits. Lungs/Pleura: Mild atelectasis is present bilaterally. No effusion or pneumothorax. Small calcified granuloma are present bilaterally. Musculoskeletal: Degenerative changes are present in the thoracic spine. No acute fracture is seen. CT ABDOMEN PELVIS FINDINGS Hepatobiliary: No focal liver abnormality is seen. The liver is mildly enlarged. No gallstones, gallbladder wall thickening, or biliary dilatation. Pancreas: Unremarkable. No pancreatic ductal dilatation or surrounding inflammatory changes. Spleen: The spleen is enlarged at 14.1 cm in length. No focal abnormality. Adrenals/Urinary Tract: The adrenal glands are within normal limits. A nonobstructive renal calculus is noted on the right. No hydroureteronephrosis bilaterally. The bladder is unremarkable. Stomach/Bowel: Stomach is within normal limits. Appendix is surgically absent. No evidence of bowel wall thickening, distention, or inflammatory changes. No free air or pneumatosis. Vascular/Lymphatic: Aortic atherosclerosis. No enlarged abdominal or pelvic lymph nodes. Reproductive: Status post hysterectomy. No adnexal masses. Other: No abdominopelvic ascites. Subcutaneous fat stranding is noted in the anterior abdominal wall in the left lower quadrant, unchanged from the prior exam. Musculoskeletal: Degenerative changes  are present in the lumbar spine. No acute fracture. IMPRESSION: 1. No acute process in the chest, abdomen, or pelvis. 2. Nonobstructive right renal calculus. 3. Mild hepatosplenomegaly. 4. Subcutaneous fat stranding in the anterior abdominal wall in the left lower quadrant, unchanged from the prior exam, possible cellulitis. 5. Coronary artery calcifications. 6. Aortic atherosclerosis. Electronically Signed   By: Thornell Sartorius M.D.   On: 04/16/2023 00:19   DG Chest 2 View  Result Date: 04/15/2023 CLINICAL DATA:  Left-sided chest pain for 2 days EXAM: CHEST - 2 VIEW COMPARISON:  03/30/2023 FINDINGS: Frontal and lateral views of the chest demonstrate an unremarkable cardiac silhouette. No  airspace disease, effusion, or pneumothorax. No acute bony abnormality. IMPRESSION: 1. No acute intrathoracic process. Electronically Signed   By: Sharlet Salina M.D.   On: 04/15/2023 22:33    EKG: I independently viewed the EKG done and my findings are as followed: Sinus tachycardia at a rate of 106 bpm and LVH with repolarization abnormality  Assessment/Plan Present on Admission:  Atypical chest pain  Essential hypertension  Mixed hyperlipidemia  Principal Problem:   Atypical chest pain Active Problems:   Mixed hyperlipidemia   Essential hypertension   CAD S/P percutaneous coronary angioplasty   Nausea   Acute kidney injury superimposed on chronic kidney disease (HCC)   Type 2 diabetes mellitus with hyperglycemia (HCC)   Persistent atrial fibrillation (HCC)   Obesity, Class III, BMI 40-49.9 (morbid obesity) (HCC)  Atypical Chest Pain Cardiovascular risk factors include hypertension, hyperlipidemia, T2DM, obesity Continue telemetry  Troponins x2 -  5 > 6 EKG showed sinus tachycardia at a rate of 106 bpm and LVH with repolarization abnormality Chest pain resolved with IV morphine given in the ED Cardiology will be consulted to help decide if Stress test is needed in am Versus other  diagnostic  modalities.     Nausea Zofran was given in the ED, continue Zofran as needed  Acute kidney injury superimposed on CKD creatinine 1.94 (baseline creatinine at 1.1-1.4) Continue gentle hydration Renally adjust medications, avoid nephrotoxic agents/dehydration/hypotension  Type 2 DM with hyperglycemia Continue ISS and hypoglycemia protocol  Chronic atrial fibrillation Coreg will be temporarily held pending patient being evaluated by cardiologist Continue Eliquis  Morbid obesity (BMI 43.40) Diet and lifestyle modification Patient may need to follow-up with PCP for weight loss program  CAD s/p PCI Continue Plavix, statin Coreg temporarily held pending cardiology evaluation  Essential HTN Coreg temporarily held  Mixed HLD Continue statin  DVT prophylaxis: Eliquis   Advance Care Planning: Full code  Consults: Cardiology  Family Communication: None at bedside  Severity of Illness: The appropriate patient status for this patient is OBSERVATION. Observation status is judged to be reasonable and necessary in order to provide the required intensity of service to ensure the patient's safety. The patient's presenting symptoms, physical exam findings, and initial radiographic and laboratory data in the context of their medical condition is felt to place them at decreased risk for further clinical deterioration. Furthermore, it is anticipated that the patient will be medically stable for discharge from the hospital within 2 midnights of admission.   Author: Frankey Shown, DO 04/16/2023 6:41 AM  For on call review [email or web address].

## 2023-04-16 NOTE — Progress Notes (Signed)
   04/16/23 1419  TOC Brief Assessment  Insurance and Status Reviewed  Patient has primary care physician Yes ([facility])  Home environment has been reviewed from home with spouse  Prior level of function: independent  Prior/Current Home Services No current home services  Social Determinants of Health Reivew SDOH reviewed no interventions necessary  Readmission risk has been reviewed Yes  Transition of care needs no transition of care needs at this time   [facility] Screen Transition of Care Department [facility]) has reviewed patient and no TOC needs have been identified at this time. We will continue to monitor patient advancement through interdisciplinary progression rounds. If new patient transition needs arise, please place a TOC consult.

## 2023-04-16 NOTE — Progress Notes (Signed)
ANTICOAGULATION CONSULT NOTE - Follow Up  Pharmacy Consult for Heparin Indication: chest pain/ACS  Allergies  Allergen Reactions   Solu-Medrol [Methylprednisolone Sodium Succ] Shortness Of Breath   Contrast Media [Iodinated Contrast Media] Itching and Swelling   Doxycycline Hives   Ibuprofen Other (See Comments)    GI upset   Keflex [Cephalexin] Nausea And Vomiting   Metformin And Related Nausea Only   Nitroglycerin Nausea And Vomiting   Nsaids Other (See Comments)    Stomach pain/bleeding   Shellfish Allergy Itching and Swelling   Sulfa Antibiotics Nausea And Vomiting   Toradol [Ketorolac Tromethamine] Hives   Bentyl [Dicyclomine Hcl] Anxiety   Blueberry Flavor Nausea And Vomiting and Rash   Nitrofurantoin Monohyd Macro Itching   Strawberry Flavor Nausea And Vomiting and Rash    Patient Measurements: Height: 5\' 3"  (160 cm) Weight: 111.1 kg (245 lb) IBW/kg (Calculated) : 52.4 HEPARIN DW (KG): 79.2   Vital Signs: Temp: 98.2 F (36.8 C) (07/31 2221) Temp Source: Oral (07/31 2221) BP: 127/76 (07/31 2221) Pulse Rate: 67 (07/31 2221)  Labs: Recent Labs    04/15/23 2158 04/15/23 2342 04/16/23 0652 04/16/23 1536 04/16/23 2139  HGB 10.2*  --  9.8*  --   --   HCT 30.7*  --  30.4*  --   --   PLT 191  --  178  --   --   APTT  --   --   --  28 42*  HEPARINUNFRC  --   --   --  <0.10* 0.16*  CREATININE 1.94*  --  1.89*  --   --   TROPONINIHS 5 6  --   --   --     Estimated Creatinine Clearance: 37.9 mL/min (A) (by C-G formula based on SCr of 1.89 mg/dL (H)).   Medical History: Past Medical History:  Diagnosis Date   Asthma    Atrial fibrillation (HCC)    Bipolar 1 disorder (HCC)    Chronic back pain    Chronic chest pain    Diabetes mellitus without complication (HCC)    Gastroparesis    GERD (gastroesophageal reflux disease)    Hyperlipemia    Hypertension    IBS (irritable bowel syndrome)    Normal cardiac stress test 02/2014   [facility]     Medications:  Medications Prior to Admission  Medication Sig Dispense Refill Last Dose   acetaminophen (TYLENOL) 325 MG tablet Take by mouth.   unknown   albuterol (PROAIR HFA) 108 (90 BASE) MCG/ACT inhaler Inhale 1-2 puffs into the lungs every 6 (six) hours as needed for wheezing or shortness of breath.   unknown   apixaban (ELIQUIS) 5 MG TABS tablet Take 1 tablet (5 mg total) by mouth 2 (two) times daily. 60 tablet 0 04/12/2023 at AM   atorvastatin (LIPITOR) 40 MG tablet Take 40 mg by mouth every morning.   04/15/2023   carvedilol (COREG) 25 MG tablet Take 1 tablet (25 mg total) by mouth 2 (two) times daily with a meal. 30 tablet 0 04/15/2023   clopidogrel (PLAVIX) 75 MG tablet Take 75 mg by mouth daily.   04/15/2023   diphenhydrAMINE (BANOPHEN) 25 mg capsule Take 25 mg by mouth at bedtime as needed for sleep.   Past Week at AM   gabapentin (NEURONTIN) 300 MG capsule Take 300 mg by mouth 3 (three) times daily.   Past Month   insulin detemir (LEVEMIR) 100 UNIT/ML injection Inject 50 Units into the skin 2 (two) times  daily.   04/15/2023   isosorbide mononitrate (IMDUR) 30 MG 24 hr tablet Take 30 mg by mouth every morning.   04/15/2023   lamoTRIgine (LAMICTAL) 100 MG tablet Take 100 mg by mouth 2 (two) times daily.   04/15/2023   NOVOLOG FLEXPEN 100 UNIT/ML FlexPen Inject 20 Units into the skin 4 (four) times daily.   2 04/15/2023   sertraline (ZOLOFT) 100 MG tablet Take 100 mg by mouth daily.   04/15/2023   hydrOXYzine (ATARAX) 25 MG tablet Take 1 tablet (25 mg total) by mouth every 6 (six) hours as needed for anxiety. (Patient not taking: Reported on 04/16/2023) 30 tablet 0 Not Taking    Assessment: Patient present with chest pain with atypical features, Dyspnea on exertion and diaphoresis. 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 Will check baseline APTT and HL. Cardiology plans to do cath and bridging with heparin.  7/31 PM: Heparin  level 0.16 and aPTT 42 seconds on 1000 units/hr. Per RN no pauses of issues with heparin infusion running continuously. aPTT and heparin level correlating will switch to heparin levels only.  Goal of Therapy:  Heparin level 0.3-0.7 units/ml Monitor platelets by anticoagulation protocol: Yes   Plan:  Increase heparin infusion to 1300 units/hr Check anti-Xa level in 8 hours and daily while on heparin Continue to monitor H&H and platelets  Arabella Merles, PharmD. Clinical Pharmacist 04/16/2023 11:05 PM

## 2023-04-16 NOTE — Plan of Care (Signed)

## 2023-04-17 ENCOUNTER — Observation Stay (HOSPITAL_COMMUNITY): Payer: Medicaid - Out of State

## 2023-04-17 DIAGNOSIS — R079 Chest pain, unspecified: Secondary | ICD-10-CM

## 2023-04-17 DIAGNOSIS — E871 Hypo-osmolality and hyponatremia: Secondary | ICD-10-CM | POA: Diagnosis present

## 2023-04-17 DIAGNOSIS — Z794 Long term (current) use of insulin: Secondary | ICD-10-CM | POA: Diagnosis not present

## 2023-04-17 DIAGNOSIS — E1165 Type 2 diabetes mellitus with hyperglycemia: Secondary | ICD-10-CM | POA: Diagnosis not present

## 2023-04-17 DIAGNOSIS — I130 Hypertensive heart and chronic kidney disease with heart failure and stage 1 through stage 4 chronic kidney disease, or unspecified chronic kidney disease: Secondary | ICD-10-CM | POA: Diagnosis present

## 2023-04-17 DIAGNOSIS — I272 Pulmonary hypertension, unspecified: Secondary | ICD-10-CM | POA: Diagnosis present

## 2023-04-17 DIAGNOSIS — R0789 Other chest pain: Secondary | ICD-10-CM

## 2023-04-17 DIAGNOSIS — N189 Chronic kidney disease, unspecified: Secondary | ICD-10-CM

## 2023-04-17 DIAGNOSIS — I4819 Other persistent atrial fibrillation: Secondary | ICD-10-CM | POA: Diagnosis not present

## 2023-04-17 DIAGNOSIS — E1122 Type 2 diabetes mellitus with diabetic chronic kidney disease: Secondary | ICD-10-CM | POA: Diagnosis present

## 2023-04-17 DIAGNOSIS — I5033 Acute on chronic diastolic (congestive) heart failure: Secondary | ICD-10-CM | POA: Diagnosis not present

## 2023-04-17 DIAGNOSIS — D631 Anemia in chronic kidney disease: Secondary | ICD-10-CM | POA: Diagnosis present

## 2023-04-17 DIAGNOSIS — I2510 Atherosclerotic heart disease of native coronary artery without angina pectoris: Secondary | ICD-10-CM | POA: Diagnosis not present

## 2023-04-17 DIAGNOSIS — I200 Unstable angina: Secondary | ICD-10-CM | POA: Diagnosis not present

## 2023-04-17 DIAGNOSIS — F319 Bipolar disorder, unspecified: Secondary | ICD-10-CM | POA: Diagnosis present

## 2023-04-17 DIAGNOSIS — R1031 Right lower quadrant pain: Secondary | ICD-10-CM | POA: Diagnosis not present

## 2023-04-17 DIAGNOSIS — N1832 Chronic kidney disease, stage 3b: Secondary | ICD-10-CM | POA: Diagnosis not present

## 2023-04-17 DIAGNOSIS — I252 Old myocardial infarction: Secondary | ICD-10-CM | POA: Diagnosis not present

## 2023-04-17 DIAGNOSIS — K760 Fatty (change of) liver, not elsewhere classified: Secondary | ICD-10-CM | POA: Diagnosis present

## 2023-04-17 DIAGNOSIS — N1831 Chronic kidney disease, stage 3a: Secondary | ICD-10-CM | POA: Diagnosis present

## 2023-04-17 DIAGNOSIS — Z6841 Body Mass Index (BMI) 40.0 and over, adult: Secondary | ICD-10-CM | POA: Diagnosis not present

## 2023-04-17 DIAGNOSIS — J45909 Unspecified asthma, uncomplicated: Secondary | ICD-10-CM | POA: Diagnosis present

## 2023-04-17 DIAGNOSIS — I5032 Chronic diastolic (congestive) heart failure: Secondary | ICD-10-CM | POA: Diagnosis not present

## 2023-04-17 DIAGNOSIS — I25110 Atherosclerotic heart disease of native coronary artery with unstable angina pectoris: Secondary | ICD-10-CM | POA: Diagnosis not present

## 2023-04-17 DIAGNOSIS — Z1152 Encounter for screening for COVID-19: Secondary | ICD-10-CM | POA: Diagnosis not present

## 2023-04-17 DIAGNOSIS — Z79899 Other long term (current) drug therapy: Secondary | ICD-10-CM | POA: Diagnosis not present

## 2023-04-17 DIAGNOSIS — N179 Acute kidney failure, unspecified: Secondary | ICD-10-CM

## 2023-04-17 DIAGNOSIS — E118 Type 2 diabetes mellitus with unspecified complications: Secondary | ICD-10-CM | POA: Diagnosis not present

## 2023-04-17 DIAGNOSIS — R0602 Shortness of breath: Secondary | ICD-10-CM | POA: Diagnosis not present

## 2023-04-17 DIAGNOSIS — E1143 Type 2 diabetes mellitus with diabetic autonomic (poly)neuropathy: Secondary | ICD-10-CM | POA: Diagnosis present

## 2023-04-17 DIAGNOSIS — Z86718 Personal history of other venous thrombosis and embolism: Secondary | ICD-10-CM | POA: Diagnosis not present

## 2023-04-17 DIAGNOSIS — I10 Essential (primary) hypertension: Secondary | ICD-10-CM | POA: Diagnosis not present

## 2023-04-17 DIAGNOSIS — D649 Anemia, unspecified: Secondary | ICD-10-CM | POA: Diagnosis not present

## 2023-04-17 DIAGNOSIS — E782 Mixed hyperlipidemia: Secondary | ICD-10-CM | POA: Diagnosis not present

## 2023-04-17 DIAGNOSIS — Z7901 Long term (current) use of anticoagulants: Secondary | ICD-10-CM | POA: Diagnosis not present

## 2023-04-17 LAB — GLUCOSE, CAPILLARY
Glucose-Capillary: 149 mg/dL — ABNORMAL HIGH (ref 70–99)
Glucose-Capillary: 171 mg/dL — ABNORMAL HIGH (ref 70–99)
Glucose-Capillary: 182 mg/dL — ABNORMAL HIGH (ref 70–99)
Glucose-Capillary: 229 mg/dL — ABNORMAL HIGH (ref 70–99)
Glucose-Capillary: 235 mg/dL — ABNORMAL HIGH (ref 70–99)
Glucose-Capillary: 284 mg/dL — ABNORMAL HIGH (ref 70–99)

## 2023-04-17 LAB — LDL CHOLESTEROL, DIRECT: Direct LDL: 120 mg/dL — ABNORMAL HIGH (ref 0–99)

## 2023-04-17 LAB — APTT: aPTT: 93 seconds — ABNORMAL HIGH (ref 24–36)

## 2023-04-17 LAB — HEPARIN LEVEL (UNFRACTIONATED): Heparin Unfractionated: 0.44 IU/mL (ref 0.30–0.70)

## 2023-04-17 MED ORDER — HEPARIN BOLUS VIA INFUSION
2000.0000 [IU] | Freq: Once | INTRAVENOUS | Status: AC
  Administered 2023-04-17: 2000 [IU] via INTRAVENOUS
  Filled 2023-04-17: qty 2000

## 2023-04-17 MED ORDER — INSULIN ASPART 100 UNIT/ML IJ SOLN: 12.0000 [IU] | Freq: Three times a day (TID) | INTRAMUSCULAR | Status: DC

## 2023-04-17 MED ORDER — MAGNESIUM SULFATE 4 GM/100ML IV SOLN
4.0000 g | Freq: Once | INTRAVENOUS | Status: AC
  Administered 2023-04-17: 4 g via INTRAVENOUS
  Filled 2023-04-17: qty 100

## 2023-04-17 MED ORDER — METOCLOPRAMIDE HCL 5 MG/ML IJ SOLN
10.0000 mg | Freq: Once | INTRAMUSCULAR | Status: AC
  Administered 2023-04-17: 10 mg via INTRAVENOUS
  Filled 2023-04-17: qty 2

## 2023-04-17 MED ORDER — ATORVASTATIN CALCIUM 80 MG PO TABS
80.0000 mg | ORAL_TABLET | Freq: Every morning | ORAL | Status: DC
  Administered 2023-04-17 – 2023-05-03 (×17): 80 mg via ORAL
  Filled 2023-04-17 (×2): qty 2
  Filled 2023-04-17 (×4): qty 1
  Filled 2023-04-17 (×2): qty 2
  Filled 2023-04-17 (×2): qty 1
  Filled 2023-04-17 (×2): qty 2
  Filled 2023-04-17: qty 1
  Filled 2023-04-17: qty 2
  Filled 2023-04-17: qty 1
  Filled 2023-04-17: qty 2
  Filled 2023-04-17: qty 1

## 2023-04-17 MED ORDER — INSULIN ASPART 100 UNIT/ML IJ SOLN
0.0000 [IU] | Freq: Three times a day (TID) | INTRAMUSCULAR | Status: DC
  Administered 2023-04-18 (×2): 3 [IU] via SUBCUTANEOUS
  Administered 2023-04-18: 5 [IU] via SUBCUTANEOUS
  Administered 2023-04-19 – 2023-04-20 (×3): 3 [IU] via SUBCUTANEOUS
  Administered 2023-04-20: 5 [IU] via SUBCUTANEOUS
  Administered 2023-04-21 – 2023-04-23 (×5): 3 [IU] via SUBCUTANEOUS
  Administered 2023-04-23: 5 [IU] via SUBCUTANEOUS
  Administered 2023-04-23: 2 [IU] via SUBCUTANEOUS
  Administered 2023-04-24 – 2023-04-26 (×6): 3 [IU] via SUBCUTANEOUS
  Administered 2023-04-26: 5 [IU] via SUBCUTANEOUS
  Administered 2023-04-27: 4 [IU] via SUBCUTANEOUS
  Administered 2023-04-27: 2 [IU] via SUBCUTANEOUS
  Administered 2023-04-27: 3 [IU] via SUBCUTANEOUS
  Administered 2023-04-28: 5 [IU] via SUBCUTANEOUS
  Administered 2023-04-28 – 2023-04-29 (×2): 3 [IU] via SUBCUTANEOUS
  Administered 2023-04-29 – 2023-04-30 (×3): 5 [IU] via SUBCUTANEOUS
  Administered 2023-05-01: 3 [IU] via SUBCUTANEOUS

## 2023-04-17 MED ORDER — CLONAZEPAM 0.5 MG PO TABS
0.5000 mg | ORAL_TABLET | Freq: Two times a day (BID) | ORAL | Status: DC | PRN
  Administered 2023-04-17 – 2023-04-19 (×2): 0.5 mg via ORAL
  Filled 2023-04-17 (×2): qty 1

## 2023-04-17 NOTE — Progress Notes (Signed)
ANTICOAGULATION CONSULT NOTE - Follow Up  Pharmacy Consult for Heparin Indication: chest pain/ACS  Allergies  Allergen Reactions   Solu-Medrol [Methylprednisolone Sodium Succ] Shortness Of Breath   Contrast Media [Iodinated Contrast Media] Itching and Swelling   Doxycycline Hives   Ibuprofen Other (See Comments)    GI upset   Keflex [Cephalexin] Nausea And Vomiting   Metformin And Related Nausea Only   Nitroglycerin Nausea And Vomiting   Nsaids Other (See Comments)    Stomach pain/bleeding   Shellfish Allergy Itching and Swelling   Sulfa Antibiotics Nausea And Vomiting   Toradol [Ketorolac Tromethamine] Hives   Bentyl [Dicyclomine Hcl] Anxiety   Blueberry Flavor Nausea And Vomiting and Rash   Nitrofurantoin Monohyd Macro Itching   Strawberry Flavor Nausea And Vomiting and Rash    Patient Measurements: Height: 5\' 3"  (160 cm) Weight: 111.1 kg (245 lb) IBW/kg (Calculated) : 52.4 HEPARIN DW (KG): 79.2   Vital Signs: Temp: 97.7 F (36.5 C) (08/01 0408) Temp Source: Oral (08/01 0408) BP: 136/73 (08/01 0408) Pulse Rate: 73 (08/01 0408)  Labs: Recent Labs    04/15/23 2158 04/15/23 2342 04/16/23 0652 04/16/23 1536 04/16/23 2139 04/17/23 0636  HGB 10.2*  --  9.8*  --   --  9.0*  HCT 30.7*  --  30.4*  --   --  28.2*  PLT 191  --  178  --   --  158  APTT  --   --   --  28 42*  --   HEPARINUNFRC  --   --   --  <0.10* 0.16* 0.18*  CREATININE 1.94*  --  1.89*  --   --  1.85*  TROPONINIHS 5 6  --   --   --   --     Estimated Creatinine Clearance: 38.7 mL/min (A) (by C-G formula based on SCr of 1.85 mg/dL (H)).   Medical History: Past Medical History:  Diagnosis Date   Asthma    Atrial fibrillation (HCC)    Bipolar 1 disorder (HCC)    Chronic back pain    Chronic chest pain    Diabetes mellitus without complication (HCC)    Gastroparesis    GERD (gastroesophageal reflux disease)    Hyperlipemia    Hypertension    IBS (irritable bowel syndrome)    Normal  cardiac stress test 02/2014   [facility]    Medications:  Medications Prior to Admission  Medication Sig Dispense Refill Last Dose   acetaminophen (TYLENOL) 325 MG tablet Take by mouth.   unknown   albuterol (PROAIR HFA) 108 (90 BASE) MCG/ACT inhaler Inhale 1-2 puffs into the lungs every 6 (six) hours as needed for wheezing or shortness of breath.   unknown   apixaban (ELIQUIS) 5 MG TABS tablet Take 1 tablet (5 mg total) by mouth 2 (two) times daily. 60 tablet 0 04/12/2023 at AM   atorvastatin (LIPITOR) 40 MG tablet Take 40 mg by mouth every morning.   04/15/2023   carvedilol (COREG) 25 MG tablet Take 1 tablet (25 mg total) by mouth 2 (two) times daily with a meal. 30 tablet 0 04/15/2023   clopidogrel (PLAVIX) 75 MG tablet Take 75 mg by mouth daily.   04/15/2023   diphenhydrAMINE (BANOPHEN) 25 mg capsule Take 25 mg by mouth at bedtime as needed for sleep.   Past Week at AM   gabapentin (NEURONTIN) 300 MG capsule Take 300 mg by mouth 3 (three) times daily.   Past Month   insulin detemir (  LEVEMIR) 100 UNIT/ML injection Inject 50 Units into the skin 2 (two) times daily.   04/15/2023   isosorbide mononitrate (IMDUR) 30 MG 24 hr tablet Take 30 mg by mouth every morning.   04/15/2023   lamoTRIgine (LAMICTAL) 100 MG tablet Take 100 mg by mouth 2 (two) times daily.   04/15/2023   NOVOLOG FLEXPEN 100 UNIT/ML FlexPen Inject 20 Units into the skin 4 (four) times daily.   2 04/15/2023   sertraline (ZOLOFT) 100 MG tablet Take 100 mg by mouth daily.   04/15/2023   hydrOXYzine (ATARAX) 25 MG tablet Take 1 tablet (25 mg total) by mouth every 6 (six) hours as needed for anxiety. (Patient not taking: Reported on 04/16/2023) 30 tablet 0 Not Taking    Assessment: Patient present with chest pain with atypical features, Dyspnea on exertion and diaphoresis. 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 Will check baseline APTT and HL. Cardiology plans to do cath  and bridging with heparin.  aPTT and heparin level correlating will switch to heparin levels only. Creatinine remains above baseline,  hold ing off on scheduling this for today.   HL 0.18, remains subtherapeutic. No issues with infusion  Goal of Therapy:  Heparin level 0.3-0.7 units/ml Monitor platelets by anticoagulation protocol: Yes   Plan:  Heparin bolus 2000 units Increase heparin infusion to 1500 units/hr Check anti-Xa level in 8 hours and daily while on heparin Continue to monitor H&H and platelets  Elder Cyphers, BS Pharm D, BCPS Clinical Pharmacist 04/17/2023 8:58 AM

## 2023-04-17 NOTE — Progress Notes (Signed)
ANTICOAGULATION CONSULT NOTE - Follow Up  Pharmacy Consult for Heparin Indication: chest pain/ACS  Allergies  Allergen Reactions   Solu-Medrol [Methylprednisolone Sodium Succ] Shortness Of Breath   Contrast Media [Iodinated Contrast Media] Itching and Swelling   Doxycycline Hives   Ibuprofen Other (See Comments)    GI upset   Keflex [Cephalexin] Nausea And Vomiting   Metformin And Related Nausea Only   Nitroglycerin Nausea And Vomiting   Nsaids Other (See Comments)    Stomach pain/bleeding   Shellfish Allergy Itching and Swelling   Sulfa Antibiotics Nausea And Vomiting   Toradol [Ketorolac Tromethamine] Hives   Bentyl [Dicyclomine Hcl] Anxiety   Blueberry Flavor Nausea And Vomiting and Rash   Nitrofurantoin Monohyd Macro Itching   Strawberry Flavor Nausea And Vomiting and Rash    Patient Measurements: Height: 5\' 3"  (160 cm) Weight: 111.1 kg (245 lb) IBW/kg (Calculated) : 52.4 HEPARIN DW (KG): 79.2   Vital Signs: Temp: 98 F (36.7 C) (08/01 1445) Temp Source: Oral (08/01 1445) BP: 139/58 (08/01 1445) Pulse Rate: 65 (08/01 1445)  Labs: Recent Labs    04/15/23 2158 04/15/23 2342 04/16/23 0652 04/16/23 1536 04/16/23 1536 04/16/23 2139 04/17/23 0636 04/17/23 1704  HGB 10.2*  --  9.8*  --   --   --  9.0*  --   HCT 30.7*  --  30.4*  --   --   --  28.2*  --   PLT 191  --  178  --   --   --  158  --   APTT  --   --   --  28  --  42*  --  93*  HEPARINUNFRC  --   --   --  <0.10*   < > 0.16* 0.18* 0.44  CREATININE 1.94*  --  1.89*  --   --   --  1.85*  --   TROPONINIHS 5 6  --   --   --   --   --   --    < > = values in this interval not displayed.    Estimated Creatinine Clearance: 38.7 mL/min (A) (by C-G formula based on SCr of 1.85 mg/dL (H)).   Medical History: Past Medical History:  Diagnosis Date   Asthma    Atrial fibrillation (HCC)    Bipolar 1 disorder (HCC)    Chronic back pain    Chronic chest pain    Diabetes mellitus without complication (HCC)     Gastroparesis    GERD (gastroesophageal reflux disease)    Hyperlipemia    Hypertension    IBS (irritable bowel syndrome)    Normal cardiac stress test 02/2014   [facility]    Medications:  Medications Prior to Admission  Medication Sig Dispense Refill Last Dose   acetaminophen (TYLENOL) 325 MG tablet Take by mouth.   unknown   albuterol (PROAIR HFA) 108 (90 BASE) MCG/ACT inhaler Inhale 1-2 puffs into the lungs every 6 (six) hours as needed for wheezing or shortness of breath.   unknown   apixaban (ELIQUIS) 5 MG TABS tablet Take 1 tablet (5 mg total) by mouth 2 (two) times daily. 60 tablet 0 04/12/2023 at AM   atorvastatin (LIPITOR) 40 MG tablet Take 40 mg by mouth every morning.   04/15/2023   carvedilol (COREG) 25 MG tablet Take 1 tablet (25 mg total) by mouth 2 (two) times daily with a meal. 30 tablet 0 04/15/2023   clopidogrel (PLAVIX) 75 MG tablet Take 75 mg  by mouth daily.   04/15/2023   diphenhydrAMINE (BANOPHEN) 25 mg capsule Take 25 mg by mouth at bedtime as needed for sleep.   Past Week at AM   gabapentin (NEURONTIN) 300 MG capsule Take 300 mg by mouth 3 (three) times daily.   Past Month   insulin detemir (LEVEMIR) 100 UNIT/ML injection Inject 50 Units into the skin 2 (two) times daily.   04/15/2023   isosorbide mononitrate (IMDUR) 30 MG 24 hr tablet Take 30 mg by mouth every morning.   04/15/2023   lamoTRIgine (LAMICTAL) 100 MG tablet Take 100 mg by mouth 2 (two) times daily.   04/15/2023   NOVOLOG FLEXPEN 100 UNIT/ML FlexPen Inject 20 Units into the skin 4 (four) times daily.   2 04/15/2023   sertraline (ZOLOFT) 100 MG tablet Take 100 mg by mouth daily.   04/15/2023   hydrOXYzine (ATARAX) 25 MG tablet Take 1 tablet (25 mg total) by mouth every 6 (six) hours as needed for anxiety. (Patient not taking: Reported on 04/16/2023) 30 tablet 0 Not Taking    Assessment: Patient present with chest pain with atypical features, Dyspnea on exertion and diaphoresis. She has h/o afib and DVT=>  chronically on eliquis but hasn't taken since last Saturday morning(04/12/23). Will check baseline APTT and HL. Cardiology plans to do cath and bridging with heparin.  aPTT and heparin level correlating will switch to heparin levels only. Creatinine remains above baseline,  hold ing off on scheduling this for today.   PM update--HL 0.44, and aPTT 93 (goal 66-102 sec)  Triglycerides are elevated at 448, will continue to follow aPTT as high TG may affect HL assay.   Goal of Therapy:  Heparin level 0.3-0.7 units/ml Monitor platelets by anticoagulation protocol: Yes   Plan:  Continue heparin infusion at 1500 units/hr Check anti-Xa level and aPTT in 8 hours and daily while on heparin Continue to monitor H&H and platelets  Cornellius Kropp A. Jeanella Craze, PharmD, BCPS, FNKF Clinical Pharmacist [place] Please utilize Amion for appropriate phone number to reach the unit pharmacist [facility])  04/17/2023 6:26 PM

## 2023-04-17 NOTE — Progress Notes (Signed)
  Echocardiogram 2D Echocardiogram has been performed.  [name] 04/17/2023, 10:38 AM

## 2023-04-17 NOTE — Hospital Course (Addendum)
62 y.o. female with medical history significant of CAD s/p PCI, hypertension, hyperlipidemia, T2DM, DVT on Eliquis who presents to the emergency department with complaints of chest pain.  Patient complained of left-sided chest pain which occurred yesterday while sitting in a car chest pain was described as sharp and nonreproducible and was rated as 8/10 on pain scale with radiation to the back, this was associated with dizziness and nausea without vomiting.  She states that she called her PCP who advised her to go to the ED for further evaluation and management.  Patient denies shortness of breath, diaphoresis, fever.  The patient's troponins have been unremarkable.  Echocardiogram on 04/22/2023 showed EF 60 to 65%, trivial MR, normal RV EF.  However, patient continued to have intermittent chest pain throughout her hospitalization.  Her hospitalization was prolonged due to development of acute on chronic renal failure with serum creatinine up to 2.56.  Nephrology was consulted.  The patient was started on IV heparin for her persistent chest pain.  She received IV heparin for about a week and it was ultimately discontinued on 04/24/2023.  After discussion with nephrology, it was felt that the patient likely may have new renal baseline.  After discussion with the patient of the risk, benefits, and alternatives, all parties involved agreed to pursue left heart catheterization.

## 2023-04-17 NOTE — Progress Notes (Addendum)
Rounding Note    Patient Name: [name] Date of Encounter: 04/17/2023  [facility] Cardiologist: New to [facility] - Dr. Wyline Mood  Subjective   Reports having intermittent pain overnight which improved with Fentanyl. We discussed trying SL NTG or Imdur but she reports feeling awful with NTG in the past. Breathing at baseline. No orthopnea, PND or pitting edema.   Inpatient Medications    Scheduled Meds:  aspirin EC  81 mg Oral Daily   atorvastatin  40 mg Oral q morning   carvedilol  12.5 mg Oral BID WC   clopidogrel  75 mg Oral Daily   gabapentin  300 mg Oral QHS   insulin aspart  0-15 Units Subcutaneous Q4H   insulin aspart  10 Units Subcutaneous TID WC   insulin detemir  50 Units Subcutaneous BID   isosorbide mononitrate  30 mg Oral q morning   lamoTRIgine  100 mg Oral BID   sertraline  100 mg Oral Daily   Continuous Infusions:  sodium chloride 75 mL/hr at 04/16/23 1748   heparin 1,300 Units/hr (04/16/23 2312)   PRN Meds: acetaminophen **OR** acetaminophen, diphenhydrAMINE, fentaNYL (SUBLIMAZE) injection, ondansetron **OR** ondansetron (ZOFRAN) IV, mouth rinse, oxyCODONE   Vital Signs    Vitals:   04/16/23 1731 04/16/23 1957 04/16/23 2221 04/17/23 0408  BP: (!) 161/77 (!) 157/81 127/76 136/73  Pulse: 71 67 67 73  Resp: 18 20 20 18   Temp: 97.8 F (36.6 C) 98 F (36.7 C) 98.2 F (36.8 C) 97.7 F (36.5 C)  TempSrc: Oral Oral Oral Oral  SpO2: 99% 97% 97% 98%  Weight:      Height:        Intake/Output Summary (Last 24 hours) at 04/17/2023 0741 Last data filed at 04/16/2023 1749 Gross per 24 hour  Intake 831.48 ml  Output --  Net 831.48 ml      04/16/2023    1:44 PM 04/15/2023    9:44 PM 04/14/2023    8:01 PM  Last 3 Weights  Weight (lbs) 245 lb 245 lb 245 lb  Weight (kg) 111.131 kg 111.131 kg 111.131 kg      Telemetry    NSR, HR in 60's to 70's.  - Personally Reviewed  ECG    No new tracings.   Physical Exam   GEN: Appears in no  acute distress.   Neck: No JVD Cardiac: RRR, no murmurs, rubs, or gallops.  Respiratory: Clear to auscultation bilaterally. GI: Soft, nontender, non-distended  MS: No pitting edema; No deformity. Neuro:  Nonfocal  Psych: Normal affect   Labs    High Sensitivity Troponin:   Recent Labs  Lab 03/30/23 1757 03/30/23 1934 04/15/23 2158 04/15/23 2342  TROPONINIHS 4 5 5 6      Chemistry Recent Labs  Lab 04/15/23 2158 04/16/23 0652 04/17/23 0636  NA 130* 135 134*  K 4.0 4.0 3.9  CL 100 102 104  CO2 20* 23 22  GLUCOSE 302* 210* 177*  BUN 28* 27* 22*  CREATININE 1.94* 1.89* 1.85*  CALCIUM 8.8* 8.8* 7.6*  MG  --  1.7 1.7  PROT 8.9* 8.4*  --   ALBUMIN 3.5 3.3*  --   AST 20 21  --   ALT 23 24  --   ALKPHOS 63 53  --   BILITOT 0.5 0.4  --   GFRNONAA 29* 30* 31*  ANIONGAP 10 10 8     Lipids  Recent Labs  Lab 04/17/23 0636  CHOL 204*  TRIG  448*  HDL 26*  LDLCALC UNABLE TO CALCULATE IF TRIGLYCERIDE OVER 400 mg/dL  CHOLHDL 7.8    Hematology Recent Labs  Lab 04/15/23 2158 04/16/23 0652 04/17/23 0636  WBC 5.7 4.8 3.8*  RBC 3.70* 3.62* 3.27*  HGB 10.2* 9.8* 9.0*  HCT 30.7* 30.4* 28.2*  MCV 83.0 84.0 86.2  MCH 27.6 27.1 27.5  MCHC 33.2 32.2 31.9  RDW 16.2* 16.1* 16.3*  PLT 191 178 158   Thyroid  Recent Labs  Lab 04/16/23 0652  TSH 5.975*  FREET4 0.93    BNPNo results for input(s): "BNP", "PROBNP" in the last 168 hours.  DDimer No results for input(s): "DDIMER" in the last 168 hours.   Radiology    CT CHEST ABDOMEN PELVIS WO CONTRAST  Result Date: 04/16/2023 CLINICAL DATA:  Sharp chest pain radiating to back. Left-sided rib pain and right-sided abdominal pain. EXAM: CT CHEST, ABDOMEN AND PELVIS WITHOUT CONTRAST TECHNIQUE: Multidetector CT imaging of the chest, abdomen and pelvis was performed following the standard protocol without IV contrast. RADIATION DOSE REDUCTION: This exam was performed according to the departmental dose-optimization program which  includes automated exposure control, adjustment of the mA and/or kV according to patient size and/or use of iterative reconstruction technique. COMPARISON:  10/07/2022. FINDINGS: CT CHEST FINDINGS Cardiovascular: The heart is normal in size and there is no pericardial effusion. Three-vessel coronary artery calcifications are noted. There is atherosclerotic calcification of the aorta without evidence of aneurysm. The pulmonary trunk is normal in caliber. Mediastinum/Nodes: No mediastinal or hilar lymphadenopathy. Evaluation of the hila is limited due to lack of IV contrast. Coarse calcification is noted in the left lobe of the thyroid gland. The trachea and esophagus are within normal limits. Lungs/Pleura: Mild atelectasis is present bilaterally. No effusion or pneumothorax. Small calcified granuloma are present bilaterally. Musculoskeletal: Degenerative changes are present in the thoracic spine. No acute fracture is seen. CT ABDOMEN PELVIS FINDINGS Hepatobiliary: No focal liver abnormality is seen. The liver is mildly enlarged. No gallstones, gallbladder wall thickening, or biliary dilatation. Pancreas: Unremarkable. No pancreatic ductal dilatation or surrounding inflammatory changes. Spleen: The spleen is enlarged at 14.1 cm in length. No focal abnormality. Adrenals/Urinary Tract: The adrenal glands are within normal limits. A nonobstructive renal calculus is noted on the right. No hydroureteronephrosis bilaterally. The bladder is unremarkable. Stomach/Bowel: Stomach is within normal limits. Appendix is surgically absent. No evidence of bowel wall thickening, distention, or inflammatory changes. No free air or pneumatosis. Vascular/Lymphatic: Aortic atherosclerosis. No enlarged abdominal or pelvic lymph nodes. Reproductive: Status post hysterectomy. No adnexal masses. Other: No abdominopelvic ascites. Subcutaneous fat stranding is noted in the anterior abdominal wall in the left lower quadrant, unchanged from the  prior exam. Musculoskeletal: Degenerative changes are present in the lumbar spine. No acute fracture. IMPRESSION: 1. No acute process in the chest, abdomen, or pelvis. 2. Nonobstructive right renal calculus. 3. Mild hepatosplenomegaly. 4. Subcutaneous fat stranding in the anterior abdominal wall in the left lower quadrant, unchanged from the prior exam, possible cellulitis. 5. Coronary artery calcifications. 6. Aortic atherosclerosis. Electronically Signed   By: Thornell Sartorius M.D.   On: 04/16/2023 00:19   DG Chest 2 View  Result Date: 04/15/2023 CLINICAL DATA:  Left-sided chest pain for 2 days EXAM: CHEST - 2 VIEW COMPARISON:  03/30/2023 FINDINGS: Frontal and lateral views of the chest demonstrate an unremarkable cardiac silhouette. No airspace disease, effusion, or pneumothorax. No acute bony abnormality. IMPRESSION: 1. No acute intrathoracic process. Electronically Signed   By: Sharlet Salina  M.D.   On: 04/15/2023 22:33    Cardiac Studies   Cardiac Catheterization: 04/2021 Findings:  Coronary Arteries:   LM: Large, with 20% lesion in the distal LM prior to trifurcation.   LAD: Large, wraps around the apex. Gives off a small to medium sized  diagonal Francia Verry. There are two tandem tubular 40% stenoses in the proximal  and mid LAD.   Ramus: Large, bifurcates distally. 30-40% stenosis in the proximal to mid  vessel.   LCx: Large, non-dominant. Large OM1. No significant atherosclerosis.   RCA: Large, dominant. Gives off medium sized RPDA and RPLV branches.  Proximal stenosis with some haziness with 30-40% visual stenosis. 99%  stenosis in the mid RCA right before an acute marginal Infinity Jeffords, with TIMI  2b flow distally. No change in stenosis or flow with intracoronary  nitroglycerin.   LVEDP: 9 mmHg   Interventional Summary:   Lesion Vessel  Culprit Length Graft High/C  FINAL PCI LESION: Mid RCA Yes 15mm No Yes  Severe Calcification Bifurication Pre-stenosis Pre-TIMI Post-stenosis   Post-TIMI  Yes No RCA 2 RCA 3    Percutaneous coronary intervention of the mid right coronary artery was  performed. The RCA was engaged with a 57F AL 0.75 guide catheter. The  distal vessel was wired with a 0.014" Sion Black guidewire. The lesion was  predilated using a 2.5 x 12mm balloon. IVUS of the mid RCA was performed  with a [place] IVUS catheter. This showed showing significant  fibrocalcific plaque with distal and proximal reference vessel size of  2.7-2.8 mm. The hazy portion in the proximal RCA on angiography  corresponded to a lesion with near concentric calcification and an MLA of  4.2 mm2. Accordingly, we decided to leave this lesion alone.   The mid RCA lesion was further modified with a 2.75 x 12 mm [place]  balloon. The lesion was stented with a 2.75 x 18 mm [place] drug eluting  stent. The stent was post-dilated with a 2.75 x 12 mm [place] balloon. Final  angiographic result was excellent. The arteriotomy site was successfully  compressed with a TR band.   Complications: none   Patient Profile     63 y.o. female w/ PMH of CAD (s/p NSTEMI with DES to mid-RCA in 04/2021 in [place] by review of Care Everywhere), HTN, HLD, Type 2 DM, history of DVT's, Stage 3 CKD, atrial fibrillation, Bipolar disorder, depression, anxiety and gastroparesis who is currently admitted for evaluation of chest pain.   Assessment & Plan    1. Chest Pain with Atypical Features/Dyspnea on Exertion/Diaphoresis - Her episodes of chest discomfort did have atypical qualities but resembled her prior symptoms at the time of her MI in 2022. Also reported worsening dyspnea on exertion and episodes of diaphoresis with minimal activity. Troponin values have been negative and EKG is without acute ST changes.   - Given her history of a false negative stress test and also given her BMI of 43, the utility of an NST would be limited and a cardiac catheterization has been recommended for definitive evaluation. The  patient understands that risks include but are not limited to stroke (1 in 1000), death (1 in 1000), kidney failure [usually temporary] (1 in 500), bleeding (1 in 200), allergic reaction [possibly serious] (1 in 200). She is in agreement to proceed.  - Given that her creatinine remains above baseline, would hold off on scheduling this for today. Possibly tomorrow pending repeat BMET. Continue with IV fluids today. Dr. Wyline Mood  previously reviewed her chart and she did receive Solu-Medrol prior to her CT in 09/2022 with no issues. Was recommended to plan for Solu-Medrol and Benadryl pre-procedure given her contrast allergy.   - Continue ASA, Plavix, Coreg, Imdur and Heparin.    2. CAD - She is s/p NSTEMI with DES to mid-RCA in 04/2021 in [place] by review of Care Everywhere. Will plan for a repeat cardiac catheterization this admission as discussed above. - Continue current medical therapy with ASA, Plavix, Coreg and Lipitor (will adjust dosing to 80mg  daily).     3. Paroxysmal Atrial Fibrillation - She is in normal sinus rhythm currently. Remains on Coreg 12.5 mg twice daily for rate-control.   - Eliquis currently held in anticipation of cardiac catheterization. On Heparin currently.   4. History of DVT - She was on Eliquis 5 mg twice daily for anticoagulation but has been without this for several days. Held for now in anticipation of cardiac catheterization and she is being bridged with Heparin.  5. HLD - FLP this admission shows total cholesterol 204, triglycerides 448 and HDL 26. Direct LDL pending. She was not taking medications regularly prior to admission as she had been without refills. She has been restarted on Atorvastatin and will titrate dosing to 80 mg daily. Recheck FLP and LFT's in 6 to 8 weeks.   6. Type 2 DM - Hgb A1c at 9.1. Management per the admitting team.   7. Acute on Chronic Stage 3 CKD - Baseline creatinine 1.2 - 1.4. Elevated to 1.94 on admission and still above baseline at  1.85 today. Continue with IV fluids.   8. Right Flank Pain - Has been intermittent since admission. CT Abdomen showed a nonobstructive right renal calculus. Management per the admitting team.     For questions or updates, please contact [facility] Please consult [email or web address] for contact info under        Signed, Ellsworth Lennox, PA-C  04/17/2023, 7:41 AM   Attending note  Patient seen and discussed with PA Iran Ouch, I agree with her documentation.  1.CAD /chest pain history of CAD with prior NSTEMI and DES to RCA 04/2021 at [facility] in [place] - presents with chest pain. Several weeks of progressing exertional chest pain with DOE. Pain at rest yesterday was slightly different from her exertional symptoms. The combination of symptoms she reports is similar to when she had her MI in 2022 - no objective evidence of ischemia by EKG or enzymes, echo is pending - 5'3 245 lbs with bmi 43, stress imaging would be limited. She also reports prior history of false negative stress tests. With prior disease/stent poor candidate for coronary CTA - she will need a definitive cath once her renal function has improved.  - listed solumedrol allergy,  from Oct 06, 2022 ER note prior to CTA received solumedrol and benadryl without incident. Would need dye prep if cath pursued.    - medical therapy with ASA 81, coreg 12.5mg  bid, plavix 75 (has been on since last stent apparently), atorva 40. Started hep gtt due to recurrent chest pains today - if negative cath can d/c plavix, continue just eliquis.   - renal function remains decreased from baseline, hold on cath today.      2.Afib - in SR on admit - eliquis on hold for possible cath     3. AKI on CKD - follow with IVFs  Dina Rich MD

## 2023-04-17 NOTE — Plan of Care (Signed)

## 2023-04-17 NOTE — Progress Notes (Signed)
PROGRESS NOTE   [name]  MVH:[id number] DOB: [birth year] DOA: 04/15/2023 PCP: Patient, No Pcp Per   Chief Complaint  Patient presents with   Chest Pain   Level of care: Telemetry  Brief Admission History:  62 y.o. female with medical history significant of CAD s/p PCI, hypertension, hyperlipidemia, T2DM, DVT on Eliquis who presents to the emergency department with complaints of chest pain.  Patient complained of left-sided chest pain which occurred yesterday while sitting in a car chest pain was described as sharp and nonreproducible and was rated as 8/10 on pain scale with radiation to the back, this was associated with dizziness and nausea without vomiting.  She states that she called her PCP who advised her to go to the ED for further evaluation and management.  Patient denies shortness of breath, diaphoresis, fever.    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 - increase prandial novolog to 12 units TID with meals - continue basal glargine 50 units daily  - monitor CBG 5 times per day  Chronic atrial fibrillation - carvedilol for heart rate control - holding apixaban in anticipation for possible cath  Hyperlipidemia - resumed home statin therapy  Essential Hypertension  - resumed home carvedilol with good BP control   DVT prophylaxis: IV heparin  Code Status: full  Family Communication: bedside update Disposition: anticipate transfer to [facility] for cath in 1-2 days     Consultants:  cardiology Procedures:   Antimicrobials:    Subjective: Pt still having  chest discomfort and pain intermittently Objective: Vitals:   04/16/23 2221 04/17/23 0408 04/17/23 1345 04/17/23 1445  BP: 127/76 136/73 (!) 99/47 (!) 139/58  Pulse: 67 73 65 65  Resp: 20 18 18 18   Temp: 98.2 F (36.8 C) 97.7 F (36.5 C) 97.9 F (36.6 C) 98 F (36.7 C)  TempSrc: Oral Oral Oral Oral  SpO2: 97% 98% 100% 98%  Weight:      Height:        Intake/Output Summary (Last 24 hours) at 04/17/2023 1740 Last data filed at 04/17/2023 1500 Gross per 24 hour  Intake 902.81 ml  Output --  Net 902.81 ml   Filed Weights   04/15/23 2144 04/16/23 1344  Weight: 111.1 kg 111.1 kg   Examination:  General exam: Appears calm and comfortable  Respiratory system: Clear to auscultation. Respiratory effort normal. Cardiovascular system: normal S1 & S2 heard. No JVD, murmurs, rubs, gallops or clicks. No pedal edema. Gastrointestinal system: Abdomen is nondistended, soft and nontender. No organomegaly or masses felt. Normal bowel sounds heard. Central nervous system: Alert and oriented. No focal neurological deficits. Extremities: Symmetric 5 x 5 power. Skin: No rashes, lesions or ulcers. Psychiatry: Judgement and insight appear normal. Mood & affect appropriate.   Data Reviewed: I have personally reviewed following labs and imaging studies  CBC: Recent Labs  Lab 04/15/23 2158 04/16/23 0652 04/17/23 0636  WBC 5.7 4.8 3.8*  HGB 10.2* 9.8* 9.0*  HCT 30.7* 30.4* 28.2*  MCV 83.0 84.0 86.2  PLT 191 178 158    Basic Metabolic Panel: Recent Labs  Lab 04/15/23 2158 04/16/23 0652 04/17/23 0636  NA 130* 135 134*  K 4.0 4.0 3.9  CL 100 102 104  CO2 20* 23 22  GLUCOSE 302* 210* 177*  BUN 28* 27* 22*  CREATININE 1.94* 1.89* 1.85*  CALCIUM 8.8* 8.8* 7.6*  MG  --  1.7 1.7  PHOS  --  4.8*  --     CBG: Recent Labs  Lab 04/17/23 0005 04/17/23 0408 04/17/23 0754 04/17/23 1108 04/17/23 1559  GLUCAP 229* 182* 171* 149* 235*    No results found for this or any previous visit  (from the past 240 hour(s)).   Radiology Studies: ECHOCARDIOGRAM COMPLETE  Result Date: 04/17/2023    ECHOCARDIOGRAM REPORT   Patient Name:   [name] Date of Exam: 04/17/2023 Medical Rec #:  [id number]      Height:       63.0 in Accession #:    [id number]     Weight:       245.0 lb Date of Birth:  [birth year]      BSA:          2.108 m Patient Age:    61 years       BP:           136/73 mmHg Patient Gender: F              HR:           71 bpm. [facility] Procedure: 2D Echo, Cardiac Doppler and Color Doppler Indications:    Chest Pain R07.9  History:        Patient has no prior history of Echocardiogram examinations.                 CAD, Arrythmias:Atrial Fibrillation, Signs/Symptoms:Chest Pain;                 Risk Factors:Dyslipidemia, Hypertension and Diabetes.  Sonographer:    Aron Baba Referring Phys: [id number] Ellsworth Lennox  Sonographer Comments: Patient is obese. IMPRESSIONS  1. Left ventricular ejection fraction, by estimation, is 55 to 60%. The left ventricle has normal function. The left ventricle has no regional wall motion abnormalities. Left ventricular diastolic parameters are consistent with Grade I diastolic dysfunction (impaired relaxation).  2. Right ventricular systolic function is normal. The right ventricular size is normal. Tricuspid regurgitation signal is inadequate for assessing PA pressure.  3. The mitral valve is abnormal. Mild mitral valve regurgitation. No evidence of mitral stenosis.  4. The aortic valve is tricuspid. Aortic valve regurgitation is not visualized. No aortic stenosis is present.  5. Aortic dilatation noted. 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  6. The inferior vena cava is normal in size with greater than 50% respiratory variability, suggesting right atrial pressure of 3 mmHg. FINDINGS  Left Ventricle: Left ventricular ejection fraction, by estimation, is 55 to 60%. The left ventricle has normal function. The left  ventricle has no regional wall motion abnormalities. The left ventricular internal cavity size was normal in size. There is  no left ventricular hypertrophy. Left ventricular diastolic parameters are consistent with Grade I diastolic dysfunction (impaired relaxation). Normal left ventricular filling pressure. Right Ventricle: The right ventricular size is normal. Right vetricular wall thickness was not well visualized. Right ventricular systolic function is normal. Tricuspid regurgitation signal is inadequate for assessing PA pressure. Left Atrium: Left atrial size was normal in size. Right Atrium: Right atrial size was normal in size. Pericardium: There  is no evidence of pericardial effusion. Mitral Valve: The mitral valve is abnormal. Mild mitral valve regurgitation. No evidence of mitral valve stenosis. Tricuspid Valve: The tricuspid valve is normal in structure. Tricuspid valve regurgitation is trivial. No evidence of tricuspid stenosis. Aortic Valve: The aortic valve is tricuspid. Aortic valve regurgitation is not visualized. No aortic stenosis is present. Aortic valve mean gradient measures 5.4 mmHg. Aortic valve peak gradient measures 8.7 mmHg. Aortic valve area, by VTI measures 1.85 cm. Pulmonic Valve: The pulmonic valve was not well visualized. Pulmonic valve regurgitation is not visualized. No evidence of pulmonic stenosis. Aorta: The aortic root is normal in size and structure and aortic dilatation noted. 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 Venous: The inferior vena cava is normal in size with greater than 50% respiratory variability, suggesting right atrial pressure of 3 mmHg. IAS/Shunts: No atrial level shunt detected by color flow Doppler.  LEFT VENTRICLE PLAX 2D LVIDd:         5.00 cm   Diastology LVIDs:         3.60 cm   LV e' medial:    13.20 cm/s LV PW:         0.90 cm   LV E/e' medial:  5.8 LV IVS:        0.80 cm   LV e' lateral:   6.22 cm/s LVOT diam:     2.00 cm    LV E/e' lateral: 12.3 LV SV:         67 LV SV Index:   32 LVOT Area:     3.14 cm  RIGHT VENTRICLE RV S prime:     12.60 cm/s TAPSE (M-mode): 2.2 cm LEFT ATRIUM             Index        RIGHT ATRIUM           Index LA diam:        4.00 cm 1.90 cm/m   RA Area:     10.10 cm LA Vol (A2C):   69.3 ml 32.88 ml/m  RA Volume:   20.40 ml  9.68 ml/m LA Vol (A4C):   65.9 ml 31.26 ml/m LA Biplane Vol: 70.8 ml 33.59 ml/m  AORTIC VALVE AV Area (Vmax):    1.94 cm AV Area (Vmean):   1.69 cm AV Area (VTI):     1.85 cm AV Vmax:           147.81 cm/s AV Vmean:          109.081 cm/s AV VTI:            0.360 m AV Peak Grad:      8.7 mmHg AV Mean Grad:      5.4 mmHg LVOT Vmax:         91.34 cm/s LVOT Vmean:        58.622 cm/s LVOT VTI:          0.212 m LVOT/AV VTI ratio: 0.59  AORTA Ao Root diam: 3.40 cm Ao Asc diam:  3.60 cm MITRAL VALVE MV Area (PHT): 4.71 cm     SHUNTS MV Decel Time: 161 msec     Systemic VTI:  0.21 m MR Peak grad: 82.2 mmHg     Systemic Diam: 2.00 cm MR Vmax:      453.33 cm/s MV E velocity: 76.60 cm/s MV A velocity: 108.00 cm/s MV E/A ratio:  0.71 Dina Rich MD Electronically signed by Dina Rich  MD Signature Date/Time: 04/17/2023/1:33:26 PM    Final    CT CHEST ABDOMEN PELVIS WO CONTRAST  Result Date: 04/16/2023 CLINICAL DATA:  Sharp chest pain radiating to back. Left-sided rib pain and right-sided abdominal pain. EXAM: CT CHEST, ABDOMEN AND PELVIS WITHOUT CONTRAST TECHNIQUE: Multidetector CT imaging of the chest, abdomen and pelvis was performed following the standard protocol without IV contrast. RADIATION DOSE REDUCTION: This exam was performed according to the departmental dose-optimization program which includes automated exposure control, adjustment of the mA and/or kV according to patient size and/or use of iterative reconstruction technique. COMPARISON:  10/07/2022. FINDINGS: CT CHEST FINDINGS Cardiovascular: The heart is normal in size and there is no pericardial effusion. Three-vessel  coronary artery calcifications are noted. There is atherosclerotic calcification of the aorta without evidence of aneurysm. The pulmonary trunk is normal in caliber. Mediastinum/Nodes: No mediastinal or hilar lymphadenopathy. Evaluation of the hila is limited due to lack of IV contrast. Coarse calcification is noted in the left lobe of the thyroid gland. The trachea and esophagus are within normal limits. Lungs/Pleura: Mild atelectasis is present bilaterally. No effusion or pneumothorax. Small calcified granuloma are present bilaterally. Musculoskeletal: Degenerative changes are present in the thoracic spine. No acute fracture is seen. CT ABDOMEN PELVIS FINDINGS Hepatobiliary: No focal liver abnormality is seen. The liver is mildly enlarged. No gallstones, gallbladder wall thickening, or biliary dilatation. Pancreas: Unremarkable. No pancreatic ductal dilatation or surrounding inflammatory changes. Spleen: The spleen is enlarged at 14.1 cm in length. No focal abnormality. Adrenals/Urinary Tract: The adrenal glands are within normal limits. A nonobstructive renal calculus is noted on the right. No hydroureteronephrosis bilaterally. The bladder is unremarkable. Stomach/Bowel: Stomach is within normal limits. Appendix is surgically absent. No evidence of bowel wall thickening, distention, or inflammatory changes. No free air or pneumatosis. Vascular/Lymphatic: Aortic atherosclerosis. No enlarged abdominal or pelvic lymph nodes. Reproductive: Status post hysterectomy. No adnexal masses. Other: No abdominopelvic ascites. Subcutaneous fat stranding is noted in the anterior abdominal wall in the left lower quadrant, unchanged from the prior exam. Musculoskeletal: Degenerative changes are present in the lumbar spine. No acute fracture. IMPRESSION: 1. No acute process in the chest, abdomen, or pelvis. 2. Nonobstructive right renal calculus. 3. Mild hepatosplenomegaly. 4. Subcutaneous fat stranding in the anterior abdominal  wall in the left lower quadrant, unchanged from the prior exam, possible cellulitis. 5. Coronary artery calcifications. 6. Aortic atherosclerosis. Electronically Signed   By: Thornell Sartorius M.D.   On: 04/16/2023 00:19   DG Chest 2 View  Result Date: 04/15/2023 CLINICAL DATA:  Left-sided chest pain for 2 days EXAM: CHEST - 2 VIEW COMPARISON:  03/30/2023 FINDINGS: Frontal and lateral views of the chest demonstrate an unremarkable cardiac silhouette. No airspace disease, effusion, or pneumothorax. No acute bony abnormality. IMPRESSION: 1. No acute intrathoracic process. Electronically Signed   By: Sharlet Salina M.D.   On: 04/15/2023 22:33    Scheduled Meds:  aspirin EC  81 mg Oral Daily   atorvastatin  80 mg Oral q morning   carvedilol  12.5 mg Oral BID WC   clopidogrel  75 mg Oral Daily   gabapentin  300 mg Oral QHS   [START ON 04/18/2023] insulin aspart  0-15 Units Subcutaneous TID WC   [START ON 04/18/2023] insulin aspart  12 Units Subcutaneous TID WC   insulin detemir  50 Units Subcutaneous BID   isosorbide mononitrate  30 mg Oral q morning   lamoTRIgine  100 mg Oral BID   sertraline  100 mg  Oral Daily   Continuous Infusions:  sodium chloride 50 mL/hr at 04/17/23 0837   heparin 1,500 Units/hr (04/17/23 0928)     LOS: 0 days   Time spent: 37 mins  Kiwanna Spraker Laural Benes, MD How to contact the [facility] Attending or Consulting provider 7A - 7P or covering provider during after hours 7P -7A, for this patient?  Check the care team in [facility] and look for a) attending/consulting [facility] provider listed and b) the [facility] team listed Log into [email or web address] and use [place]'s universal password to access. If you do not have the password, please contact the hospital operator. Locate the [facility] provider you are looking for under [facility] and page to a number that you can be directly reached. If you still have difficulty reaching the provider, please page the [facility] (Director on Call) for the Hospitalists listed  on amion for assistance.  04/17/2023, 5:40 PM

## 2023-04-17 NOTE — Inpatient Diabetes Management (Signed)
Inpatient Diabetes Program Recommendations  AACE/ADA: New Consensus Statement on Inpatient Glycemic Control   Target Ranges:  Prepandial:   less than 140 mg/dL      Peak postprandial:   less than 180 mg/dL (1-2 hours)      Critically ill patients:  140 - 180 mg/dL    Latest Reference Range & Units 04/17/23 00:05 04/17/23 04:08 04/17/23 07:54 04/17/23 11:08  Glucose-Capillary 70 - 99 mg/dL 782 (H) 956 (H) 213 (H) 149 (H)    Latest Reference Range & Units 04/16/23 08:00 04/16/23 12:19 04/16/23 15:56 04/16/23 20:00  Glucose-Capillary 70 - 99 mg/dL 086 (H) 578 (H) 469 (H) 219 (H)    Latest Reference Range & Units 04/15/23 21:58  Hemoglobin A1C 4.8 - 5.6 % 9.1 (H)   Review of Glycemic Control  Diabetes history: DM2 Outpatient Diabetes medications: Levemir 50 units BID, Novolog 20 units 3-4 times a day Current orders for Inpatient glycemic control: Levemir 50 units BID, Novolog 0-15 units Q4H, Novolog 10 units TID with meals  Inpatient Diabetes Program Recommendations:    Insulin Outpatient: May need to consider adjusting outpatient insulin dosages at discharge. Patient reports consistently taking the Levemir and Novolog and reports glucose is usually 200-400's mg/dl.   HbgA1C: A1C 9.1% on 04/15/23 indicating an average glucose of 214 mg/dl over the past 2-3 months.  NOTE: Spoke with patient and family at bedside about diabetes and home regimen for diabetes control. Patient reports being followed by clinic in [place], [place] for diabetes management. Patient reports that she started going to the clinic about 4-6 months ago when she moved from [place] to [place]. Patient reports that when she was in [place] she was prescribed Levemir 75 units BID and Novolog 20 units with meals and her glucose was much better controlled and her last A1C was 7%. She states that when she started going to clinic in [place] they said she was on too much medications and had her decrease the Levemir to 50 units BID and  glucose has been consistently over 200 since then. Patient reports she is consistently taking Levemir 50 units BID and Novolog 20 units 3-4 times a day and glucose stays high 200-400's mg/dl (notes it was 629 mg/dl the day before coming to the hospital).  Patient reports that last A1C was 7% when on Levemir 75 units BID and Novolog 20 units with meals.  Discussed A1C results (9.1% on 04/15/23) and explained that current A1C indicates an average glucose of 214 mg/dl over the past 2-3 months. Discussed glucose and A1C goals. Discussed importance of checking CBGs and maintaining good CBG control to prevent long-term and short-term complications. Explained how hyperglycemia leads to damage within blood vessels which lead to the common complications seen with uncontrolled diabetes. Stressed to the patient the importance of improving glycemic control to prevent further complications from uncontrolled diabetes. Discussed impact of nutrition, exercise, stress, sickness, and medications on diabetes control. Patient feels she needs to increase Levemir dose back up to get DM under better control. Patient notes that she had [place] but it was recently stopped on 04/16/23 and she is not sure why. She states she still has [place] but it will not cover any medications prescribed in [place] or [place].  Patient is very concerned about getting any new medications prescribed at discharge and about getting her normal medications filled going forward. Informed patient TOC would be consulted to see if they are able to assist with medications if needed. Encouraged patient to  try to find resources in [place] that may be able to help with medications and follow up care.  Patient verbalized understanding of information discussed and reports no further questions at this time related to diabetes.  Thanks, Orlando Penner, RN, MSN, CDE Diabetes Coordinator Inpatient Diabetes Program [phone] (Team Pager)

## 2023-04-18 ENCOUNTER — Inpatient Hospital Stay (HOSPITAL_COMMUNITY): Payer: Medicaid - Out of State

## 2023-04-18 DIAGNOSIS — N189 Chronic kidney disease, unspecified: Secondary | ICD-10-CM

## 2023-04-18 DIAGNOSIS — R079 Chest pain, unspecified: Secondary | ICD-10-CM | POA: Diagnosis not present

## 2023-04-18 DIAGNOSIS — R0789 Other chest pain: Secondary | ICD-10-CM

## 2023-04-18 DIAGNOSIS — N179 Acute kidney failure, unspecified: Secondary | ICD-10-CM

## 2023-04-18 DIAGNOSIS — I4819 Other persistent atrial fibrillation: Secondary | ICD-10-CM | POA: Diagnosis not present

## 2023-04-18 DIAGNOSIS — R1031 Right lower quadrant pain: Secondary | ICD-10-CM | POA: Diagnosis not present

## 2023-04-18 LAB — GLUCOSE, CAPILLARY
Glucose-Capillary: 176 mg/dL — ABNORMAL HIGH (ref 70–99)
Glucose-Capillary: 181 mg/dL — ABNORMAL HIGH (ref 70–99)
Glucose-Capillary: 225 mg/dL — ABNORMAL HIGH (ref 70–99)
Glucose-Capillary: 164 mg/dL — ABNORMAL HIGH (ref 70–99)
Glucose-Capillary: 154 mg/dL — ABNORMAL HIGH (ref 70–99)

## 2023-04-18 LAB — LIPASE, BLOOD: Lipase: 34 U/L (ref 11–51)

## 2023-04-18 MED ORDER — SENNOSIDES-DOCUSATE SODIUM 8.6-50 MG PO TABS
2.0000 | ORAL_TABLET | Freq: Every day | ORAL | Status: DC
  Administered 2023-04-18 – 2023-04-29 (×11): 2 via ORAL
  Filled 2023-04-18 (×14): qty 2

## 2023-04-18 MED ORDER — FAMOTIDINE 20 MG PO TABS
20.0000 mg | ORAL_TABLET | Freq: Once | ORAL | Status: AC
  Administered 2023-04-18: 20 mg via ORAL
  Filled 2023-04-18: qty 1

## 2023-04-18 MED ORDER — INSULIN ASPART 100 UNIT/ML IJ SOLN
14.0000 [IU] | Freq: Three times a day (TID) | INTRAMUSCULAR | Status: DC
  Administered 2023-04-18 – 2023-05-03 (×32): 14 [IU] via SUBCUTANEOUS

## 2023-04-18 MED ORDER — PROCHLORPERAZINE EDISYLATE 10 MG/2ML IJ SOLN
10.0000 mg | INTRAMUSCULAR | Status: DC | PRN
  Administered 2023-04-18: 10 mg via INTRAVENOUS
  Filled 2023-04-18: qty 2

## 2023-04-18 NOTE — Progress Notes (Signed)
PROGRESS NOTE   [name]  WGN:[id number] DOB: 08-03-1961 DOA: 04/15/2023 PCP: Patient, No Pcp Per   Chief Complaint  Patient presents with   Chest Pain   Level of care: Telemetry  Brief Admission History:  62 y.o. female with medical history significant of CAD s/p PCI, hypertension, hyperlipidemia, T2DM, DVT on Eliquis who presents to the emergency department with complaints of chest pain.  Patient complained of left-sided chest pain which occurred yesterday while sitting in a car chest pain was described as sharp and nonreproducible and was rated as 8/10 on pain scale with radiation to the back, this was associated with dizziness and nausea without vomiting.  She states that she called her PCP who advised her to go to the ED for further evaluation and management.  Patient denies shortness of breath, diaphoresis, fever.    Assessment and Plan:  Chest pain with high risk for cardiac etiology - appreciate cardiology team consultation - continue supportive measures, IV heparin infusion - working to improve creatinine in case cath is approved - cardio team planning to premedicate for cath given history of contrast allergy  AKI on CKD stage 3b  - creatinine slowly improving with hydration - continue IV fluid - recheck in AM   RLQ abdominal pain - persistent, lipase test came back normal - pt reports appendectomy and cholecystectomy - CT abd/pelvis without contrast ordered (due to contrast allergy and upcoming cath)  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 - increased prandial novolog to 14 units TID with meals - continue basal glargine 50 units daily  - monitor CBG 5 times per day CBG (last 3)  Recent Labs    04/18/23 0307 04/18/23 0733 04/18/23 1119  GLUCAP 176* 181* 225*    Chronic atrial fibrillation - carvedilol for heart rate control - holding apixaban in anticipation for possible cath  Hyperlipidemia - resumed home statin therapy  Essential  Hypertension  - resumed home carvedilol with good BP control   DVT prophylaxis: IV heparin  Code Status: full  Family Communication: bedside update Disposition: anticipate transfer to [facility] for cath for 04/21/23     Consultants:  cardiology Procedures:   Antimicrobials:    Subjective: Pt still having chest discomfort and pain intermittently and RLQ abdominal pain  Objective: Vitals:   04/17/23 1445 04/17/23 2105 04/18/23 0307 04/18/23 1426  BP: (!) 139/58 (!) 146/59 (!) 160/70 (!) 140/61  Pulse: 65 67 60 69  Resp: 18 18 16 19   Temp: 98 F (36.7 C) 97.8 F (36.6 C) 98 F (36.7 C) 98.2 F (36.8 C)  TempSrc: Oral Oral    SpO2: 98% 98% 98% 98%  Weight:      Height:        Intake/Output Summary (Last 24 hours) at 04/18/2023 1527 Last data filed at 04/18/2023 1230 Gross per 24 hour  Intake 3575.56 ml  Output 300 ml  Net 3275.56 ml   Filed Weights   04/15/23 2144 04/16/23 1344  Weight: 111.1 kg 111.1 kg   Examination:  General exam: Appears calm and comfortable  Respiratory system: Clear to auscultation. Respiratory effort normal. Cardiovascular system: normal S1 & S2 heard. No JVD, murmurs, rubs, gallops or clicks. No pedal edema. Gastrointestinal system: Abdomen is nondistended, soft and sharp RLQ abdominal pain. No organomegaly or masses felt. Normal bowel sounds heard. Central nervous system: Alert and oriented. No focal neurological deficits. Extremities: Symmetric 5 x 5 power. Skin: No rashes, lesions or ulcers. Psychiatry: Judgement and insight appear normal. Mood &  affect appropriate.   Data Reviewed: I have personally reviewed following labs and imaging studies  CBC: Recent Labs  Lab 04/15/23 2158 04/16/23 0652 04/17/23 0636 04/18/23 0438  WBC 5.7 4.8 3.8* 3.8*  HGB 10.2* 9.8* 9.0* 8.4*  HCT 30.7* 30.4* 28.2* 27.3*  MCV 83.0 84.0 86.2 87.2  PLT 191 178 158 155    Basic Metabolic Panel: Recent Labs  Lab 04/15/23 2158 04/16/23 0652 04/17/23 0636  04/18/23 0438  NA 130* 135 134* 135  K 4.0 4.0 3.9 4.1  CL 100 102 104 105  CO2 20* 23 22 23   GLUCOSE 302* 210* 177* 201*  BUN 28* 27* 22* 20  CREATININE 1.94* 1.89* 1.85* 1.96*  CALCIUM 8.8* 8.8* 7.6* 7.7*  MG  --  1.7 1.7  --   PHOS  --  4.8*  --   --     CBG: Recent Labs  Lab 04/17/23 1559 04/17/23 2114 04/18/23 0307 04/18/23 0733 04/18/23 1119  GLUCAP 235* 284* 176* 181* 225*    No results found for this or any previous visit (from the past 240 hour(s)).   Radiology Studies: ECHOCARDIOGRAM COMPLETE  Result Date: 04/17/2023    ECHOCARDIOGRAM REPORT   Patient Name:   [name] Date of Exam: 04/17/2023 Medical Rec #:  [id number]      Height:       63.0 in Accession #:    [id number]     Weight:       245.0 lb Date of Birth:  May 29, 1961      BSA:          2.108 m Patient Age:    61 years       BP:           136/73 mmHg Patient Gender: F              HR:           71 bpm. [facility] Procedure: 2D Echo, Cardiac Doppler and Color Doppler Indications:    Chest Pain R07.9  History:        Patient has no prior history of Echocardiogram examinations.                 CAD, Arrythmias:Atrial Fibrillation, Signs/Symptoms:Chest Pain;                 Risk Factors:Dyslipidemia, Hypertension and Diabetes.  Sonographer:    Aron Baba Referring Phys: [id number] Ellsworth Lennox  Sonographer Comments: Patient is obese. IMPRESSIONS  1. Left ventricular ejection fraction, by estimation, is 55 to 60%. The left ventricle has normal function. The left ventricle has no regional wall motion abnormalities. Left ventricular diastolic parameters are consistent with Grade I diastolic dysfunction (impaired relaxation).  2. Right ventricular systolic function is normal. The right ventricular size is normal. Tricuspid regurgitation signal is inadequate for assessing PA pressure.  3. The mitral valve is abnormal. Mild mitral valve regurgitation. No evidence of mitral stenosis.  4. The aortic valve is  tricuspid. Aortic valve regurgitation is not visualized. No aortic stenosis is present.  5. Aortic dilatation noted. 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  6. The inferior vena cava is normal in size with greater than 50% respiratory variability, suggesting right atrial pressure of 3 mmHg. FINDINGS  Left Ventricle: Left ventricular ejection fraction, by estimation, is 55 to 60%. The left ventricle has normal function. The left ventricle has no regional wall motion abnormalities. The left  ventricular internal cavity size was normal in size. There is  no left ventricular hypertrophy. Left ventricular diastolic parameters are consistent with Grade I diastolic dysfunction (impaired relaxation). Normal left ventricular filling pressure. Right Ventricle: The right ventricular size is normal. Right vetricular wall thickness was not well visualized. Right ventricular systolic function is normal. Tricuspid regurgitation signal is inadequate for assessing PA pressure. Left Atrium: Left atrial size was normal in size. Right Atrium: Right atrial size was normal in size. Pericardium: There is no evidence of pericardial effusion. Mitral Valve: The mitral valve is abnormal. Mild mitral valve regurgitation. No evidence of mitral valve stenosis. Tricuspid Valve: The tricuspid valve is normal in structure. Tricuspid valve regurgitation is trivial. No evidence of tricuspid stenosis. Aortic Valve: The aortic valve is tricuspid. Aortic valve regurgitation is not visualized. No aortic stenosis is present. Aortic valve mean gradient measures 5.4 mmHg. Aortic valve peak gradient measures 8.7 mmHg. Aortic valve area, by VTI measures 1.85 cm. Pulmonic Valve: The pulmonic valve was not well visualized. Pulmonic valve regurgitation is not visualized. No evidence of pulmonic stenosis. Aorta: The aortic root is normal in size and structure and aortic dilatation noted. There is mild dilatation of the ascending aorta,  measuring 36 mm. Venous: The inferior vena cava is normal in size with greater than 50% respiratory variability, suggesting right atrial pressure of 3 mmHg. IAS/Shunts: No atrial level shunt detected by color flow Doppler.  LEFT VENTRICLE PLAX 2D LVIDd:         5.00 cm   Diastology LVIDs:         3.60 cm   LV e' medial:    13.20 cm/s LV PW:         0.90 cm   LV E/e' medial:  5.8 LV IVS:        0.80 cm   LV e' lateral:   6.22 cm/s LVOT diam:     2.00 cm   LV E/e' lateral: 12.3 LV SV:         67 LV SV Index:   32 LVOT Area:     3.14 cm  RIGHT VENTRICLE RV S prime:     12.60 cm/s TAPSE (M-mode): 2.2 cm LEFT ATRIUM             Index        RIGHT ATRIUM           Index LA diam:        4.00 cm 1.90 cm/m   RA Area:     10.10 cm LA Vol (A2C):   69.3 ml 32.88 ml/m  RA Volume:   20.40 ml  9.68 ml/m LA Vol (A4C):   65.9 ml 31.26 ml/m LA Biplane Vol: 70.8 ml 33.59 ml/m  AORTIC VALVE AV Area (Vmax):    1.94 cm AV Area (Vmean):   1.69 cm AV Area (VTI):     1.85 cm AV Vmax:           147.81 cm/s AV Vmean:          109.081 cm/s AV VTI:            0.360 m AV Peak Grad:      8.7 mmHg AV Mean Grad:      5.4 mmHg LVOT Vmax:         91.34 cm/s LVOT Vmean:        58.622 cm/s LVOT VTI:          0.212 m LVOT/AV VTI ratio: 0.59  AORTA Ao Root diam: 3.40 cm Ao Asc diam:  3.60 cm MITRAL VALVE MV Area (PHT): 4.71 cm     SHUNTS MV Decel Time: 161 msec     Systemic VTI:  0.21 m MR Peak grad: 82.2 mmHg     Systemic Diam: 2.00 cm MR Vmax:      453.33 cm/s MV E velocity: 76.60 cm/s MV A velocity: 108.00 cm/s MV E/A ratio:  0.71 Dina Rich MD Electronically signed by Dina Rich MD Signature Date/Time: 04/17/2023/1:33:26 PM    Final     Scheduled Meds:  aspirin EC  81 mg Oral Daily   atorvastatin  80 mg Oral q morning   carvedilol  12.5 mg Oral BID WC   clopidogrel  75 mg Oral Daily   gabapentin  300 mg Oral QHS   insulin aspart  0-15 Units Subcutaneous TID WC   insulin aspart  14 Units Subcutaneous TID WC   insulin  detemir  50 Units Subcutaneous BID   isosorbide mononitrate  30 mg Oral q morning   lamoTRIgine  100 mg Oral BID   sertraline  100 mg Oral Daily   Continuous Infusions:  sodium chloride 70 mL/hr at 04/18/23 1118   heparin 1,500 Units/hr (04/18/23 1118)     LOS: 1 day   Time spent: 35 mins   Laural Benes, MD How to contact the [facility] Attending or Consulting provider 7A - 7P or covering provider during after hours 7P -7A, for this patient?  Check the care team in [facility] and look for a) attending/consulting [facility] provider listed and b) the [facility] team listed Log into [email or web address] and use [place]'s universal password to access. If you do not have the password, please contact the hospital operator. Locate the [facility] provider you are looking for under [facility] and page to a number that you can be directly reached. If you still have difficulty reaching the provider, please page the [facility] (Director on Call) for the Hospitalists listed on amion for assistance.  04/18/2023, 3:27 PM

## 2023-04-18 NOTE — Progress Notes (Signed)
Rounding Note    Patient Name: [name] Date of Encounter: 04/18/2023  [facility] Cardiologist: New to [facility] - Dr. Wyline Mood  Subjective   Pt still with some chest tightness  Also complains of R sided abdominal pain/flank pain   Inpatient Medications    Scheduled Meds:  aspirin EC  81 mg Oral Daily   atorvastatin  80 mg Oral q morning   carvedilol  12.5 mg Oral BID WC   clopidogrel  75 mg Oral Daily   gabapentin  300 mg Oral QHS   insulin aspart  0-15 Units Subcutaneous TID WC   insulin aspart  14 Units Subcutaneous TID WC   insulin detemir  50 Units Subcutaneous BID   isosorbide mononitrate  30 mg Oral q morning   lamoTRIgine  100 mg Oral BID   sertraline  100 mg Oral Daily   Continuous Infusions:  sodium chloride 70 mL/hr at 04/18/23 1118   heparin 1,500 Units/hr (04/18/23 1118)   PRN Meds: acetaminophen **OR** acetaminophen, clonazePAM, diphenhydrAMINE, fentaNYL (SUBLIMAZE) injection, ondansetron **OR** ondansetron (ZOFRAN) IV, mouth rinse, oxyCODONE   Vital Signs    Vitals:   04/17/23 1345 04/17/23 1445 04/17/23 2105 04/18/23 0307  BP: (!) 99/47 (!) 139/58 (!) 146/59 (!) 160/70  Pulse: 65 65 67 60  Resp: 18 18 18 16   Temp: 97.9 F (36.6 C) 98 F (36.7 C) 97.8 F (36.6 C) 98 F (36.7 C)  TempSrc: Oral Oral Oral   SpO2: 100% 98% 98% 98%  Weight:      Height:        Intake/Output Summary (Last 24 hours) at 04/18/2023 1355 Last data filed at 04/18/2023 1118 Gross per 24 hour  Intake 3577.12 ml  Output 300 ml  Net 3277.12 ml      04/16/2023    1:44 PM 04/15/2023    9:44 PM 04/14/2023    8:01 PM  Last 3 Weights  Weight (lbs) 245 lb 245 lb 245 lb  Weight (kg) 111.131 kg 111.131 kg 111.131 kg      Telemetry    SR   70s   - Personally Reviewed  ECG    No new tracings.   Physical Exam   GEN: Appears in no acute distress.   Neck: Normal JVP Cardiac: RRR, no murmurs Respiratory: Clear to auscultation bilaterally. GI: Soft Tender  on R side  No masses  No rebound  MS: No pitting edema; No deformity. Neuro:  Nonfocal  Psych: Normal affect   Labs    High Sensitivity Troponin:   Recent Labs  Lab 03/30/23 1757 03/30/23 1934 04/15/23 2158 04/15/23 2342  TROPONINIHS 4 5 5 6      Chemistry Recent Labs  Lab 04/15/23 2158 04/16/23 0652 04/17/23 0636 04/18/23 0438  NA 130* 135 134* 135  K 4.0 4.0 3.9 4.1  CL 100 102 104 105  CO2 20* 23 22 23   GLUCOSE 302* 210* 177* 201*  BUN 28* 27* 22* 20  CREATININE 1.94* 1.89* 1.85* 1.96*  CALCIUM 8.8* 8.8* 7.6* 7.7*  MG  --  1.7 1.7  --   PROT 8.9* 8.4*  --   --   ALBUMIN 3.5 3.3*  --   --   AST 20 21  --   --   ALT 23 24  --   --   ALKPHOS 63 53  --   --   BILITOT 0.5 0.4  --   --   GFRNONAA 29* 30* 31* 29*  ANIONGAP 10 10 8 7     Lipids  Recent Labs  Lab 04/17/23 0636  CHOL 204*  TRIG 448*  HDL 26*  LDLCALC UNABLE TO CALCULATE IF TRIGLYCERIDE OVER 400 mg/dL  CHOLHDL 7.8    Hematology Recent Labs  Lab 04/16/23 0652 04/17/23 0636 04/18/23 0438  WBC 4.8 3.8* 3.8*  RBC 3.62* 3.27* 3.13*  HGB 9.8* 9.0* 8.4*  HCT 30.4* 28.2* 27.3*  MCV 84.0 86.2 87.2  MCH 27.1 27.5 26.8  MCHC 32.2 31.9 30.8  RDW 16.1* 16.3* 16.2*  PLT 178 158 155   Thyroid  Recent Labs  Lab 04/16/23 0652  TSH 5.975*  FREET4 0.93    BNPNo results for input(s): "BNP", "PROBNP" in the last 168 hours.  DDimer No results for input(s): "DDIMER" in the last 168 hours.   Radiology    CT CHEST ABDOMEN PELVIS WO CONTRAST  Result Date: 04/16/2023 CLINICAL DATA:  Sharp chest pain radiating to back. Left-sided rib pain and right-sided abdominal pain. EXAM: CT CHEST, ABDOMEN AND PELVIS WITHOUT CONTRAST TECHNIQUE: Multidetector CT imaging of the chest, abdomen and pelvis was performed following the standard protocol without IV contrast. RADIATION DOSE REDUCTION: This exam was performed according to the departmental dose-optimization program which includes automated exposure control,  adjustment of the mA and/or kV according to patient size and/or use of iterative reconstruction technique. COMPARISON:  10/07/2022. FINDINGS: CT CHEST FINDINGS Cardiovascular: The heart is normal in size and there is no pericardial effusion. Three-vessel coronary artery calcifications are noted. There is atherosclerotic calcification of the aorta without evidence of aneurysm. The pulmonary trunk is normal in caliber. Mediastinum/Nodes: No mediastinal or hilar lymphadenopathy. Evaluation of the hila is limited due to lack of IV contrast. Coarse calcification is noted in the left lobe of the thyroid gland. The trachea and esophagus are within normal limits. Lungs/Pleura: Mild atelectasis is present bilaterally. No effusion or pneumothorax. Small calcified granuloma are present bilaterally. Musculoskeletal: Degenerative changes are present in the thoracic spine. No acute fracture is seen. CT ABDOMEN PELVIS FINDINGS Hepatobiliary: No focal liver abnormality is seen. The liver is mildly enlarged. No gallstones, gallbladder wall thickening, or biliary dilatation. Pancreas: Unremarkable. No pancreatic ductal dilatation or surrounding inflammatory changes. Spleen: The spleen is enlarged at 14.1 cm in length. No focal abnormality. Adrenals/Urinary Tract: The adrenal glands are within normal limits. A nonobstructive renal calculus is noted on the right. No hydroureteronephrosis bilaterally. The bladder is unremarkable. Stomach/Bowel: Stomach is within normal limits. Appendix is surgically absent. No evidence of bowel wall thickening, distention, or inflammatory changes. No free air or pneumatosis. Vascular/Lymphatic: Aortic atherosclerosis. No enlarged abdominal or pelvic lymph nodes. Reproductive: Status post hysterectomy. No adnexal masses. Other: No abdominopelvic ascites. Subcutaneous fat stranding is noted in the anterior abdominal wall in the left lower quadrant, unchanged from the prior exam. Musculoskeletal:  Degenerative changes are present in the lumbar spine. No acute fracture. IMPRESSION: 1. No acute process in the chest, abdomen, or pelvis. 2. Nonobstructive right renal calculus. 3. Mild hepatosplenomegaly. 4. Subcutaneous fat stranding in the anterior abdominal wall in the left lower quadrant, unchanged from the prior exam, possible cellulitis. 5. Coronary artery calcifications. 6. Aortic atherosclerosis. Electronically Signed   By: Thornell Sartorius M.D.   On: 04/16/2023 00:19   DG Chest 2 View  Result Date: 04/15/2023 CLINICAL DATA:  Left-sided chest pain for 2 days EXAM: CHEST - 2 VIEW COMPARISON:  03/30/2023 FINDINGS: Frontal and lateral views of the chest demonstrate an unremarkable cardiac silhouette. No airspace disease,  effusion, or pneumothorax. No acute bony abnormality. IMPRESSION: 1. No acute intrathoracic process. Electronically Signed   By: Sharlet Salina M.D.   On: 04/15/2023 22:33    Cardiac Studies   Cardiac Catheterization: 04/2021 Findings:  Coronary Arteries:   LM: Large, with 20% lesion in the distal LM prior to trifurcation.   LAD: Large, wraps around the apex. Gives off a small to medium sized  diagonal branch. There are two tandem tubular 40% stenoses in the proximal  and mid LAD.   Ramus: Large, bifurcates distally. 30-40% stenosis in the proximal to mid  vessel.   LCx: Large, non-dominant. Large OM1. No significant atherosclerosis.   RCA: Large, dominant. Gives off medium sized RPDA and RPLV branches.  Proximal stenosis with some haziness with 30-40% visual stenosis. 99%  stenosis in the mid RCA right before an acute marginal branch, with TIMI  2b flow distally. No change in stenosis or flow with intracoronary  nitroglycerin.   LVEDP: 9 mmHg   Interventional Summary:   Lesion Vessel  Culprit Length Graft High/C  FINAL PCI LESION: Mid RCA Yes 15mm No Yes  Severe Calcification Bifurication Pre-stenosis Pre-TIMI Post-stenosis  Post-TIMI  Yes No RCA 2 RCA 3     Percutaneous coronary intervention of the mid right coronary artery was  performed. The RCA was engaged with a 72F AL 0.75 guide catheter. The  distal vessel was wired with a 0.014" S[place] Black guidewire. The lesion was  predilated using a 2.5 x 12mm balloon. IVUS of the mid RCA was performed  with a [place] IVUS catheter. This showed showing significant  fibrocalcific plaque with distal and proximal reference vessel size of  2.7-2.8 mm. The hazy portion in the proximal RCA on angiography  corresponded to a lesion with near concentric calcification and an MLA of  4.2 mm2. Accordingly, we decided to leave this lesion alone.   The mid RCA lesion was further modified with a 2.75 x 12 mm [place]  balloon. The lesion was stented with a 2.75 x 18 mm [place] drug eluting  stent. The stent was post-dilated with a 2.75 x 12 mm [place] balloon. Final  angiographic result was excellent. The arteriotomy site was successfully  compressed with a TR band.   Complications: none   Patient Profile     62 y.o. female w/ PMH of CAD (s/p NSTEMI with DES to mid-RCA in 04/2021 in [place] by review of Care Everywhere), HTN, HLD, Type 2 DM, history of DVT's, Stage 3 CKD, atrial fibrillation, Bipolar disorder, depression, anxiety and gastroparesis who is currently admitted for evaluation of chest pain.   Assessment & Plan    1. Chest Pain with Atypical Features/Dyspnea on Exertion/Diaphoresis - Her episodes of chest discomfort did have atypical qualities but resembled her prior symptoms at the time of her MI in 2022. Also reported worsening dyspnea on exertion and episodes of diaphoresis with minimal activity. Troponin values have been negative and EKG is without acute ST changes.   - Given her history of a false negative stress test and also given her BMI of 43, the utility of an NST would be limited and a cardiac catheterization has been recommended for definitive evaluation. The patient understands that risks include  but are not limited to stroke (1 in 1000), death (1 in 1000), kidney failure [usually temporary] (1 in 500), bleeding (1 in 200), allergic reaction [possibly serious] (1 in 200). She is in agreement to proceed.  Cr is higher today than yesterday  1.96 today    WOuld give more IV fluid on Sunday with plans for LHC on Monday   No LV gram. Will need solumedrol and benedryl prior    - Given that her creatinine remains above b- Continue ASA, Plavix, Coreg, Imdur and Heparin.    2. CAD - She is s/p NSTEMI with DES to mid-RCA in 04/2021 in [place] by review of Care Everywhere. Will plan for a repeat cardiac catheterization this admission as discussed above. - Continue current medical therapy with ASA, Plavix, Coreg and Lipitor (will adjust dosing to 80mg  daily).     3. Paroxysmal Atrial Fibrillation -Remains in  normal sinus rhythm . Remains on Coreg 12.5 mg twice daily for rate-control.   - Eliquis currently held in anticipation of cardiac catheterization. On Heparin currently.   4. History of DVT - She was on Eliquis 5 mg twice daily for anticoagulation but has been without this for several days. Held for now in anticipation of cardiac catheterization and she is being bridged with Heparin.  5. HLD - FLP this admission shows total cholesterol 204, triglycerides 448 and HDL .  LDL was 120    She was not taking medications regularly prior to admission as she had been without refills. She has been restarted on Atorvastatin and will titrate dosing to 80 mg daily. Recheck FLP and LFT's in 6 to 8 weeks.   6. Type 2 DM - Hgb A1c at 9.1. Management per the admitting team.  Discussed diet  LImit carbs, sugars     7. Acute on Chronic Stage 3 CKD - Baseline creatinine 1.2 - 1.4. Today 1.96    IV fluids for Sunday    8. Right Flank Pain - Has been intermittent since admission. CT Abdomen showed a nonobstructive right renal calculus. Management per the admitting team.     For questions or updates, please  contact [facility] Please consult [email or web address] for contact info under        Signed, Dietrich Pates, MD  04/18/2023, 1:55 PM   Attending note  Patient seen and discussed with PA Iran Ouch, I agree with her documentation.  1.CAD /chest pain history of CAD with prior NSTEMI and DES to RCA 04/2021 at [facility] in [place] - presents with chest pain. Several weeks of progressing exertional chest pain with DOE. Pain at rest yesterday was slightly different from her exertional symptoms. The combination of symptoms she reports is similar to when she had her MI in 2022 - no objective evidence of ischemia by EKG or enzymes, echo is pending - 5'3 245 lbs with bmi 43, stress imaging would be limited. She also reports prior history of false negative stress tests. With prior disease/stent poor candidate for coronary CTA - she will need a definitive cath once her renal function has improved.  - listed solumedrol allergy,  from Oct 06, 2022 ER note prior to CTA received solumedrol and benadryl without incident. Would need dye prep if cath pursued.    - medical therapy with ASA 81, coreg 12.5mg  bid, plavix 75 (has been on since last stent apparently), atorva 40. Started hep gtt due to recurrent chest pains today - if negative cath can d/c plavix, continue just eliquis.   - renal function remains decreased from baseline, hold on cath today.      2.Afib - in SR on admit - eliquis on hold for possible cath     3. AKI on CKD - follow with IVFs  Dina Rich MD

## 2023-04-18 NOTE — Progress Notes (Signed)
ANTICOAGULATION CONSULT NOTE   Pharmacy Consult for Heparin Indication: chest pain/ACS  Allergies  Allergen Reactions   Solu-Medrol [Methylprednisolone Sodium Succ] Shortness Of Breath   Contrast Media [Iodinated Contrast Media] Itching and Swelling   Doxycycline Hives   Ibuprofen Other (See Comments)    GI upset   Keflex [Cephalexin] Nausea And Vomiting   Metformin And Related Nausea Only   Nitroglycerin Nausea And Vomiting   Nsaids Other (See Comments)    Stomach pain/bleeding   Shellfish Allergy Itching and Swelling   Sulfa Antibiotics Nausea And Vomiting   Toradol [Ketorolac Tromethamine] Hives   Bentyl [Dicyclomine Hcl] Anxiety   Blueberry Flavor Nausea And Vomiting and Rash   Nitrofurantoin Monohyd Macro Itching   Strawberry Flavor Nausea And Vomiting and Rash    Patient Measurements: Height: 5\' 3"  (160 cm) Weight: 111.1 kg (245 lb) IBW/kg (Calculated) : 52.4 HEPARIN DW (KG): 79.2   Vital Signs: Temp: 98 F (36.7 C) (08/02 0307) Temp Source: Oral (08/01 2105) BP: 160/70 (08/02 0307) Pulse Rate: 60 (08/02 0307)  Labs: Recent Labs    04/15/23 2158 04/15/23 2342 04/16/23 0652 04/16/23 1536 04/16/23 2139 04/17/23 0636 04/17/23 1704 04/18/23 0438  HGB 10.2*  --  9.8*  --   --  9.0*  --  8.4*  HCT 30.7*  --  30.4*  --   --  28.2*  --  27.3*  PLT 191  --  178  --   --  158  --  155  APTT  --   --   --    < > 42*  --  93* 83*  HEPARINUNFRC  --   --   --    < > 0.16* 0.18* 0.44 0.46  CREATININE 1.94*  --  1.89*  --   --  1.85*  --  1.96*  TROPONINIHS 5 6  --   --   --   --   --   --    < > = values in this interval not displayed.    Estimated Creatinine Clearance: 36.6 mL/min (A) (by C-G formula based on SCr of 1.96 mg/dL (H)).   Medical History: Past Medical History:  Diagnosis Date   Asthma    Atrial fibrillation (HCC)    Bipolar 1 disorder (HCC)    Chronic back pain    Chronic chest pain    Diabetes mellitus without complication (HCC)     Gastroparesis    GERD (gastroesophageal reflux disease)    Hyperlipemia    Hypertension    IBS (irritable bowel syndrome)    Normal cardiac stress test 02/2014   [facility]   Assessment: Patient present with chest pain with atypical features, Dyspnea on exertion and diaphoresis. 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 Will check baseline APTT and HL. Cardiology plans to do cath and bridging with heparin.  aPTT and heparin level continue to be  correlating will switch to heparin levels only. No bleeding issues noted, hemoglobin continues to trend down to 8.4, platelet count stable.   Goal of Therapy:  Heparin level 0.3-0.7 units/ml Monitor platelets by anticoagulation protocol: Yes   Plan:  Continue heparin infusion at 1500 units/hr Check anti-Xa level and aPTT in 8 hours and daily while on heparin Continue to monitor H&H and platelets  Sheppard Coil PharmD., BCPS Clinical Pharmacist 04/18/2023 8:24 AM

## 2023-04-18 NOTE — TOC Initial Note (Signed)
Transition of Care [facility]) - Initial/Assessment Note    Patient Details  Name: [name] MRN: [id number] Date of Birth: 1961-08-19  Transition of Care [facility]) CM/SW Contact:    Annice Needy, LCSW Phone Number: 04/18/2023, 2:04 PM  Clinical Narrative:                 TOC consulted by Drue Second because " Patient reports that she moved to [place] from [place]. She has [place] but it will not cover any medications in [place]. Pt is concerned that she will not be able to get medications filled if prescribed any new medications at discharge."  Upon speaking with patient, she reports that she has been in [place] for about six months. She [place] but it was cut off 8/1 by Ms. Bethann Goo because they hate them for unknown reason and that she needs to produce legal separation papers for her marriage (patient is reporting that she is separated). Patient states that she will also have to terminate her [place] Medicaid to get [place] medicaid again. Patient states that she has not terminated her [facility] because she is unsure about whether she would be staying in [place] or going back to [place]. TOC pointed out that patient had been in [place] for six months and would have to make a decision regarding terminating her [place] medicaid in order to obtain [place] medicaid. Patient stated that she planned on terminating her [facility] when she is discharged. He stated that she has 30 days worth of medications left and would have everything addressed before she ran out of medications.  Expected Discharge Plan: Home/Self Care Barriers to Discharge: No Barriers Identified   Patient Goals and CMS Choice            Expected Discharge Plan and Services                                              Prior Living Arrangements/Services                       Activities of Daily Living Home Assistive Devices/Equipment: None ADL Screening (condition at time of admission) Patient's cognitive  ability adequate to safely complete daily activities?: Yes Is the patient deaf or have difficulty hearing?: No Does the patient have difficulty seeing, even when wearing glasses/contacts?: No Does the patient have difficulty concentrating, remembering, or making decisions?: No Patient able to express need for assistance with ADLs?: Yes Does the patient have difficulty dressing or bathing?: No Independently performs ADLs?: Yes (appropriate for developmental age) Does the patient have difficulty walking or climbing stairs?: Yes Weakness of Legs: Both Weakness of Arms/Hands: None  Permission Sought/Granted                  Emotional Assessment     Affect (typically observed): Appropriate Orientation: : Oriented to Situation, Oriented to  Time, Oriented to Place, Oriented to Self Alcohol / Substance Use: Not Applicable Psych Involvement: No (comment)  Admission diagnosis:  Atypical chest pain [R07.89] Chest pain, unspecified type [R07.9] Chest pain with high risk for cardiac etiology [R07.9] Patient Active Problem List   Diagnosis Date Noted   Chest pain with high risk for cardiac etiology 04/17/2023   Atypical chest pain 04/16/2023   Nausea 04/16/2023   Acute kidney injury superimposed on chronic  kidney disease (HCC) 04/16/2023   Type 2 diabetes mellitus with hyperglycemia (HCC) 04/16/2023   Persistent atrial fibrillation (HCC) 04/16/2023   Obesity, Class III, BMI 40-49.9 (morbid obesity) (HCC) 04/16/2023   Erroneous encounter - disregard 02/14/2023   Left-sided weakness 02/08/2023   BRBPR (bright red blood per rectum) 04/07/2012   GASTRIC POLYP 10/21/2006   Mixed hyperlipidemia 10/21/2006   DISORDER, BIPOLAR NOS 10/21/2006   DEPRESSION 10/21/2006   MIGRAINE HEADACHE 10/21/2006   Essential hypertension 10/21/2006   CAD S/P percutaneous coronary angioplasty 10/21/2006   GERD 10/21/2006   LOW BACK PAIN 10/21/2006   PCP:  Patient, No Pcp Per Pharmacy:   [id number] [place], [place] - [address] AT [place] [facility] [place] [address] [place] [address] Phone: [phone] Fax: [phone]     Social Determinants of Health (SDOH) Social History: SDOH Screenings   Food Insecurity: No Food Insecurity (04/16/2023)  Housing: Low Risk  (04/16/2023)  Transportation Needs: Unmet Transportation Needs (04/16/2023)  Utilities: Not At Risk (04/16/2023)  Tobacco Use: Medium Risk (04/15/2023)   SDOH Interventions:     Readmission Risk Interventions     No data to display

## 2023-04-18 NOTE — Inpatient Diabetes Management (Signed)
Inpatient Diabetes Program Recommendations  AACE/ADA: New Consensus Statement on Inpatient Glycemic Control   Target Ranges:  Prepandial:   less than 140 mg/dL      Peak postprandial:   less than 180 mg/dL (1-2 hours)      Critically ill patients:  140 - 180 mg/dL    Latest Reference Range & Units 04/17/23 00:05 04/17/23 04:08 04/17/23 07:54 04/17/23 11:08 04/17/23 15:59 04/17/23 21:14 04/18/23 03:07 04/18/23 07:33  Glucose-Capillary 70 - 99 mg/dL 621 (H) 308 (H) 657 (H) 149 (H) 235 (H) 284 (H) 176 (H) 181 (H)   Review of Glycemic Control  Diabetes history: DM2 Outpatient Diabetes medications: Levemir 50 units BID, Novolog 20 units 3-4 times a day Current orders for Inpatient glycemic control: Levemir 50 units BID, Novolog 0-15 units TID with meals, Novolog 12 units TID with meals   Inpatient Diabetes Program Recommendations:     Insulin: Please consider increasing Levemir to 53 units BID and adding Novolog 0-5 units at bedtime.  Insulin Outpatient: May need to consider adjusting outpatient insulin dosages at discharge. Patient reports consistently taking the Levemir and Novolog and reports glucose is usually 200-400's mg/dl.    HbgA1C: A1C 9.1% on 04/15/23 indicating an average glucose of 214 mg/dl over the past 2-3 months.   Thanks, Orlando Penner, RN, MSN, CDCES Diabetes Coordinator Inpatient Diabetes Program [phone] (Team Pager from 8am to 5pm)

## 2023-04-18 NOTE — Progress Notes (Signed)
Patient has rested well.  Patient has had c/o pain and nausea.  Pain medication administered and helped relieve pain.  Patient is awaiting her procedure.

## 2023-04-19 ENCOUNTER — Inpatient Hospital Stay (HOSPITAL_COMMUNITY): Payer: Medicaid - Out of State

## 2023-04-19 DIAGNOSIS — R079 Chest pain, unspecified: Secondary | ICD-10-CM | POA: Diagnosis not present

## 2023-04-19 DIAGNOSIS — I4819 Other persistent atrial fibrillation: Secondary | ICD-10-CM | POA: Diagnosis not present

## 2023-04-19 DIAGNOSIS — R1031 Right lower quadrant pain: Secondary | ICD-10-CM | POA: Diagnosis not present

## 2023-04-19 DIAGNOSIS — R0789 Other chest pain: Secondary | ICD-10-CM | POA: Diagnosis not present

## 2023-04-19 LAB — GLUCOSE, CAPILLARY
Glucose-Capillary: 145 mg/dL — ABNORMAL HIGH (ref 70–99)
Glucose-Capillary: 173 mg/dL — ABNORMAL HIGH (ref 70–99)
Glucose-Capillary: 192 mg/dL — ABNORMAL HIGH (ref 70–99)
Glucose-Capillary: 106 mg/dL — ABNORMAL HIGH (ref 70–99)
Glucose-Capillary: 137 mg/dL — ABNORMAL HIGH (ref 70–99)

## 2023-04-19 MED ORDER — POLYETHYLENE GLYCOL 3350 17 G PO PACK
17.0000 g | PACK | Freq: Two times a day (BID) | ORAL | Status: DC
  Administered 2023-04-19 – 2023-04-29 (×14): 17 g via ORAL
  Filled 2023-04-19 (×26): qty 1

## 2023-04-19 MED ORDER — CLONAZEPAM 0.5 MG PO TABS
0.5000 mg | ORAL_TABLET | Freq: Three times a day (TID) | ORAL | Status: DC
  Administered 2023-04-19 – 2023-05-03 (×42): 0.5 mg via ORAL
  Filled 2023-04-19 (×42): qty 1

## 2023-04-19 MED ORDER — BISACODYL 10 MG RE SUPP
10.0000 mg | Freq: Every day | RECTAL | Status: DC | PRN
  Filled 2023-04-19: qty 1

## 2023-04-19 NOTE — Plan of Care (Signed)

## 2023-04-19 NOTE — Progress Notes (Signed)
PROGRESS NOTE   Simrit Gohlke  OZH:[id number] DOB: 12-14-1960 DOA: 04/15/2023 PCP: Patient, No Pcp Per   Chief Complaint  Patient presents with   Chest Pain   Level of care: Telemetry  Brief Admission History:  62 y.o. female with medical history significant of CAD s/p PCI, hypertension, hyperlipidemia, T2DM, DVT on Eliquis who presents to the emergency department with complaints of chest pain.  Patient complained of left-sided chest pain which occurred yesterday while sitting in a car chest pain was described as sharp and nonreproducible and was rated as 8/10 on pain scale with radiation to the back, this was associated with dizziness and nausea without vomiting.  She states that she called her PCP who advised her to go to the ED for further evaluation and management.  Patient denies shortness of breath, diaphoresis, fever.    Assessment and Plan:  Chest pain with high risk for cardiac etiology - appreciate cardiology team consultation - continue supportive measures, IV heparin infusion - working to improve creatinine in case cath is approved - cardio team planning to premedicate for cath given history of contrast allergy  AKI on CKD stage 3b  - creatinine slowly improving with hydration - continue IV fluid - recheck in AM  - renal US done 8/3 with normal findings reported   RLQ abdominal pain - persistent, lipase test came back normal - pt reports appendectomy and cholecystectomy - CT abd/pelvis without contrast ordered (due to contrast allergy and upcoming cath) - CT with findings of hepatomegaly and steatosis of liver   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 - increased prandial novolog to 14 units TID with meals - continue home detemir 50 units BID   - monitor CBG 5 times per day CBG (last 3)  Recent Labs    04/19/23 0242 04/19/23 0739 04/19/23 1120  GLUCAP 145* 173* 192*    Chronic atrial fibrillation - carvedilol for heart rate control - holding  apixaban in anticipation for possible cath - IV heparin for anticoagulation   Hyperlipidemia - resumed home statin therapy  Essential Hypertension  - resumed home carvedilol with good BP control   DVT prophylaxis: IV heparin  Code Status: full  Family Communication: bedside update Disposition: anticipate transfer to [facility] for cath for 04/21/23     Consultants:  cardiology Procedures:   Antimicrobials:    Subjective: Pt with multiple complaints, left leg neuropathic pain, nausea and persistent chest discomfort. No SOB. Nausea medication not working.   Objective: Vitals:   04/18/23 1426 04/18/23 2007 04/19/23 0420 04/19/23 0756  BP: (!) 140/61 134/63 (!) 110/94 (!) 130/57  Pulse: 69 62 72 72  Resp: 19 20 20 20   Temp: 98.2 F (36.8 C) (!) 97.5 F (36.4 C) 97.7 F (36.5 C) 98 F (36.7 C)  TempSrc:  Oral  Oral  SpO2: 98% 93% 98% 95%  Weight:      Height:        Intake/Output Summary (Last 24 hours) at 04/19/2023 1301 Last data filed at 04/19/2023 0900 Gross per 24 hour  Intake 540 ml  Output --  Net 540 ml   Filed Weights   04/15/23 2144 04/16/23 1344  Weight: 111.1 kg 111.1 kg   Examination:  General exam: Appears calm and comfortable  Respiratory system: Clear to auscultation. Respiratory effort normal. Cardiovascular system: normal S1 & S2 heard. No JVD, murmurs, rubs, gallops or clicks. No pedal edema. Gastrointestinal system: Abdomen is nondistended, soft and sharp RLQ abdominal pain. No organomegaly or  masses felt. Normal bowel sounds heard. Central nervous system: Alert and oriented. No focal neurological deficits. Extremities: Symmetric 5 x 5 power. Skin: No rashes, lesions or ulcers. Psychiatry: Judgement and insight appear normal. Mood & affect appropriate.   Data Reviewed: I have personally reviewed following labs and imaging studies  CBC: Recent Labs  Lab 04/15/23 2158 04/16/23 0652 04/17/23 0636 04/18/23 0438 04/19/23 0448  WBC 5.7 4.8 3.8* 3.8*  3.8*  HGB 10.2* 9.8* 9.0* 8.4* 8.2*  HCT 30.7* 30.4* 28.2* 27.3* 26.8*  MCV 83.0 84.0 86.2 87.2 88.7  PLT 191 178 158 155 154    Basic Metabolic Panel: Recent Labs  Lab 04/15/23 2158 04/16/23 0652 04/17/23 0636 04/18/23 0438 04/19/23 0448  NA 130* 135 134* 135 136  K 4.0 4.0 3.9 4.1 4.4  CL 100 102 104 105 107  CO2 20* 23 22 23  19*  GLUCOSE 302* 210* 177* 201* 200*  BUN 28* 27* 22* 20 19  CREATININE 1.94* 1.89* 1.85* 1.96* 2.07*  CALCIUM 8.8* 8.8* 7.6* 7.7* 8.0*  MG  --  1.7 1.7  --   --   PHOS  --  4.8*  --   --   --     CBG: Recent Labs  Lab 04/18/23 1629 04/18/23 2012 04/19/23 0242 04/19/23 0739 04/19/23 1120  GLUCAP 164* 154* 145* 173* 192*    No results found for this or any previous visit (from the past 240 hour(s)).   Radiology Studies: US RENAL  Result Date: 04/19/2023 CLINICAL DATA:  Acute on chronic kidney injury EXAM: RENAL / URINARY TRACT ULTRASOUND COMPLETE COMPARISON:  CT abdomen pelvis 04/18/2023 FINDINGS: Right Kidney: Renal measurements: 9.4 x 4.9 x 4.4 cm = volume: 105 mL. Echogenicity within normal limits. No mass or hydronephrosis visualized. Left Kidney: Renal measurements: 11.6 x 5.6 x 5.0 cm = volume: 169 mL. Echogenicity within normal limits. No mass or hydronephrosis visualized. Bladder: Limited evaluation due to to collapsed configuration. Other: Diffuse increased echogenicity of the visualized portions of the hepatic parenchyma are a nonspecific indicator of hepatocellular dysfunction, most commonly steatosis. IMPRESSION: No significant sonographic abnormality of the kidneys. Electronically Signed   By: Acquanetta Belling M.D.   On: 04/19/2023 10:55   CT ABDOMEN PELVIS WO CONTRAST  Result Date: 04/18/2023 CLINICAL DATA:  Right lower quadrant abdominal pain EXAM: CT ABDOMEN AND PELVIS WITHOUT CONTRAST TECHNIQUE: Multidetector CT imaging of the abdomen and pelvis was performed following the standard protocol without IV contrast. RADIATION DOSE  REDUCTION: This exam was performed according to the departmental dose-optimization program which includes automated exposure control, adjustment of the mA and/or kV according to patient size and/or use of iterative reconstruction technique. COMPARISON:  None Available. FINDINGS: Lower chest: No acute findings. Coronary artery and aortic calcifications. Hepatobiliary: Prior cholecystectomy. Diffuse fatty infiltration of the liver. No focal hepatic abnormality. Mild hepatomegaly with craniocaudal length 23 cm. Pancreas: No focal abnormality or ductal dilatation. Spleen: Splenomegaly with a craniocaudal length of 14.5 cm. This is stable since prior study. No focal abnormality. Adrenals/Urinary Tract: Punctate bilateral nonobstructing renal calculi. No ureteral stones or hydronephrosis. Adrenal glands and urinary bladder unremarkable. Stomach/Bowel: Stomach, large and small bowel grossly unremarkable. Vascular/Lymphatic: Aortic atherosclerosis. No evidence of aneurysm or adenopathy. Reproductive: Prior hysterectomy.  No adnexal masses. Other: No free fluid or free air. Musculoskeletal: No acute bony abnormality. IMPRESSION: Hepatic steatosis.  Hepatosplenomegaly. No acute findings in the abdomen or pelvis. Aortic atherosclerosis, coronary artery disease. Punctate bilateral nephrolithiasis.  No hydronephrosis. Electronically Signed  By: Charlett Nose M.D.   On: 04/18/2023 19:22    Scheduled Meds:  aspirin EC  81 mg Oral Daily   atorvastatin  80 mg Oral q morning   carvedilol  12.5 mg Oral BID WC   clonazePAM  0.5 mg Oral TID   clopidogrel  75 mg Oral Daily   gabapentin  300 mg Oral QHS   insulin aspart  0-15 Units Subcutaneous TID WC   insulin aspart  14 Units Subcutaneous TID WC   insulin detemir  50 Units Subcutaneous BID   isosorbide mononitrate  30 mg Oral q morning   lamoTRIgine  100 mg Oral BID   senna-docusate  2 tablet Oral QHS   sertraline  100 mg Oral Daily   Continuous Infusions:  sodium  chloride 50 mL/hr at 04/19/23 4742   heparin 1,500 Units/hr (04/19/23 1222)     LOS: 2 days   Time spent: 35 mins   Laural Benes, MD How to contact the [facility] Attending or Consulting provider 7A - 7P or covering provider during after hours 7P -7A, for this patient?  Check the care team in [facility] and look for a) attending/consulting TRH provider listed and b) the [facility] team listed Log into [email or web address] and use [place]'s universal password to access. If you do not have the password, please contact the hospital operator. Locate the [facility] provider you are looking for under [facility] and page to a number that you can be directly reached. If you still have difficulty reaching the provider, please page the [facility] (Director on Call) for the Hospitalists listed on amion for assistance.  04/19/2023, 1:01 PM

## 2023-04-19 NOTE — Progress Notes (Signed)
ANTICOAGULATION CONSULT NOTE   Pharmacy Consult for Heparin Indication: chest pain/ACS  Allergies  Allergen Reactions   Solu-Medrol [Methylprednisolone Sodium Succ] Shortness Of Breath   Contrast Media [Iodinated Contrast Media] Itching and Swelling   Doxycycline Hives   Ibuprofen Other (See Comments)    GI upset   Keflex [Cephalexin] Nausea And Vomiting   Metformin And Related Nausea Only   Nitroglycerin Nausea And Vomiting   Nsaids Other (See Comments)    Stomach pain/bleeding   Shellfish Allergy Itching and Swelling   Sulfa Antibiotics Nausea And Vomiting   Toradol [Ketorolac Tromethamine] Hives   Bentyl [Dicyclomine Hcl] Anxiety   Blueberry Flavor Nausea And Vomiting and Rash   Nitrofurantoin Monohyd Macro Itching   Strawberry Flavor Nausea And Vomiting and Rash    Patient Measurements: Height: 5\' 3"  (160 cm) Weight: 111.1 kg (245 lb) IBW/kg (Calculated) : 52.4 HEPARIN DW (KG): 79.2   Vital Signs: Temp: 97.7 F (36.5 C) (08/03 0420) Temp Source: Oral (08/02 2007) BP: 110/94 (08/03 0420) Pulse Rate: 72 (08/03 0420)  Labs: Recent Labs    04/16/23 2139 04/16/23 2139 04/17/23 0636 04/17/23 1704 04/18/23 0438 04/19/23 0448  HGB  --    < > 9.0*  --  8.4* 8.2*  HCT  --   --  28.2*  --  27.3* 26.8*  PLT  --   --  158  --  155 154  APTT 42*  --   --  93* 83*  --   HEPARINUNFRC 0.16*  --  0.18* 0.44 0.46 0.33  CREATININE  --   --  1.85*  --  1.96* 2.07*   < > = values in this interval not displayed.    Estimated Creatinine Clearance: 34.6 mL/min (A) (by C-G formula based on SCr of 2.07 mg/dL (H)).   Medical History: Past Medical History:  Diagnosis Date   Asthma    Atrial fibrillation (HCC)    Bipolar 1 disorder (HCC)    Chronic back pain    Chronic chest pain    Diabetes mellitus without complication (HCC)    Gastroparesis    GERD (gastroesophageal reflux disease)    Hyperlipemia    Hypertension    IBS (irritable bowel syndrome)    Normal cardiac  stress test 02/2014   [facility]   Assessment: Patient present with chest pain with atypical features, Dyspnea on exertion and diaphoresis. 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 Cardiology plans to do cath and bridging with heparin.  Heparin level continues to be at goal on 1500 units/hr. Cbc stable, no bleeding noted.   Goal of Therapy:  Heparin level 0.3-0.7 units/ml Monitor platelets by anticoagulation protocol: Yes   Plan:  Continue heparin infusion at 1500 units/hr Check anti-Xa level daily Continue to monitor H&H and platelets  Sheppard Coil PharmD., BCPS Clinical Pharmacist 04/19/2023 7:53 AM

## 2023-04-20 ENCOUNTER — Inpatient Hospital Stay (HOSPITAL_COMMUNITY): Payer: Medicaid - Out of State

## 2023-04-20 DIAGNOSIS — R079 Chest pain, unspecified: Secondary | ICD-10-CM | POA: Diagnosis not present

## 2023-04-20 DIAGNOSIS — R0789 Other chest pain: Secondary | ICD-10-CM | POA: Diagnosis not present

## 2023-04-20 DIAGNOSIS — R1031 Right lower quadrant pain: Secondary | ICD-10-CM | POA: Diagnosis not present

## 2023-04-20 DIAGNOSIS — I4819 Other persistent atrial fibrillation: Secondary | ICD-10-CM | POA: Diagnosis not present

## 2023-04-20 LAB — GLUCOSE, CAPILLARY
Glucose-Capillary: 160 mg/dL — ABNORMAL HIGH (ref 70–99)
Glucose-Capillary: 151 mg/dL — ABNORMAL HIGH (ref 70–99)
Glucose-Capillary: 205 mg/dL — ABNORMAL HIGH (ref 70–99)
Glucose-Capillary: 118 mg/dL — ABNORMAL HIGH (ref 70–99)
Glucose-Capillary: 109 mg/dL — ABNORMAL HIGH (ref 70–99)

## 2023-04-20 LAB — BASIC METABOLIC PANEL WITH GFR
Anion gap: 6 (ref 5–15)
BUN: 20 mg/dL (ref 6–20)
CO2: 23 mmol/L (ref 22–32)
Calcium: 7.8 mg/dL — ABNORMAL LOW (ref 8.9–10.3)
Chloride: 108 mmol/L (ref 98–111)
Creatinine, Ser: 2.17 mg/dL — ABNORMAL HIGH (ref 0.44–1.00)
GFR, Estimated: 25 mL/min — ABNORMAL LOW (ref >60)
Glucose, Bld: 147 mg/dL — ABNORMAL HIGH (ref 70–99)
Potassium: 4.3 mmol/L (ref 3.5–5.1)
Sodium: 137 mmol/L (ref 135–145)

## 2023-04-20 LAB — CBC
HCT: 27.1 % — ABNORMAL LOW (ref 36.0–46.0)
Hemoglobin: 8.2 g/dL — ABNORMAL LOW (ref 12.0–15.0)
MCH: 26.8 pg (ref 26.0–34.0)
MCHC: 30.3 g/dL (ref 30.0–36.0)
MCV: 88.6 fL (ref 80.0–100.0)
Platelets: 150 10*3/uL (ref 150–400)
RBC: 3.06 MIL/uL — ABNORMAL LOW (ref 3.87–5.11)
RDW: 16.9 % — ABNORMAL HIGH (ref 11.5–15.5)
WBC: 4.2 10*3/uL (ref 4.0–10.5)
nRBC: 0 % (ref 0.0–0.2)

## 2023-04-20 LAB — URINALYSIS, ROUTINE W REFLEX MICROSCOPIC
Bilirubin Urine: NEGATIVE
Glucose, UA: NEGATIVE mg/dL
Hgb urine dipstick: NEGATIVE
Ketones, ur: NEGATIVE mg/dL
Leukocytes,Ua: NEGATIVE
Nitrite: NEGATIVE
Protein, ur: NEGATIVE mg/dL
Specific Gravity, Urine: 1.006 (ref 1.005–1.030)
pH: 5 (ref 5.0–8.0)

## 2023-04-20 LAB — HEPARIN LEVEL (UNFRACTIONATED): Heparin Unfractionated: 0.3 [IU]/mL (ref 0.30–0.70)

## 2023-04-20 MED ORDER — CYCLOBENZAPRINE HCL 10 MG PO TABS
5.0000 mg | ORAL_TABLET | Freq: Once | ORAL | Status: AC
  Administered 2023-04-20: 5 mg via ORAL
  Filled 2023-04-20: qty 1

## 2023-04-20 NOTE — Progress Notes (Signed)
Pt with chest pain controlled with oral pain med and x1 admin of IV pain med. Nausea relieved with one  dose of antiemetic medication. Pt eating meals without difficulty. Pt has been ambulatory with assistance to bathroom today, no increased SOB or CP with exertion. VSS.

## 2023-04-20 NOTE — Progress Notes (Signed)
ANTICOAGULATION CONSULT NOTE   Pharmacy Consult for Heparin Indication: chest pain/ACS    Patient Measurements: Height: 5\' 3"  (160 cm) Weight: 111.1 kg (245 lb) IBW/kg (Calculated) : 52.4 HEPARIN DW (KG): 79.2   Vital Signs: Temp: 98.3 F (36.8 C) (08/04 0503) Temp Source: Oral (08/04 0503) BP: 130/74 (08/04 0503) Pulse Rate: 74 (08/04 0503)  Labs: Recent Labs    04/17/23 1704 04/17/23 1704 04/18/23 0438 04/19/23 0448 04/20/23 0455  HGB  --    < > 8.4* 8.2* 8.2*  HCT  --   --  27.3* 26.8* 27.1*  PLT  --   --  155 154 150  APTT 93*  --  83*  --   --   HEPARINUNFRC 0.44  --  0.46 0.33 0.30  CREATININE  --   --  1.96* 2.07* 2.17*   < > = values in this interval not displayed.    Estimated Creatinine Clearance: 33 mL/min (A) (by C-G formula based on SCr of 2.17 mg/dL (H)).   Medical History: Past Medical History:  Diagnosis Date   Asthma    Atrial fibrillation (HCC)    Bipolar 1 disorder (HCC)    Chronic back pain    Chronic chest pain    Diabetes mellitus without complication (HCC)    Gastroparesis    GERD (gastroesophageal reflux disease)    Hyperlipemia    Hypertension    IBS (irritable bowel syndrome)    Normal cardiac stress test 02/2014   [facility]   Assessment: Patient present with chest pain with atypical features, Dyspnea on exertion and diaphoresis. 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 Cardiology plans to do cath and is bridging with heparin.  Heparin level continues to be at goal on 1500 units/hr. Cbc stable, no bleeding noted.   Goal of Therapy:  Heparin level 0.3-0.7 units/ml Monitor platelets by anticoagulation protocol: Yes   Plan:  Continue heparin infusion at 1500 units/hr Check anti-Xa level daily Continue to monitor H&H and platelets  Sheppard Coil PharmD., BCPS Clinical Pharmacist 04/20/2023 8:46 AM

## 2023-04-20 NOTE — Plan of Care (Signed)

## 2023-04-20 NOTE — Progress Notes (Signed)
PROGRESS NOTE  [name]  ZOX:[id number] DOB: [birth year] DOA: 04/15/2023 PCP: Patient, No Pcp Per   Chief Complaint  Patient presents with   Chest Pain   Level of care: Telemetry  Brief Admission History:  62 y.o. female with medical history significant of CAD s/p PCI, hypertension, hyperlipidemia, T2DM, DVT on Eliquis who presents to the emergency department with complaints of chest pain.  Patient complained of left-sided chest pain which occurred yesterday while sitting in a car chest pain was described as sharp and nonreproducible and was rated as 8/10 on pain scale with radiation to the back, this was associated with dizziness and nausea without vomiting.  She states that she called her PCP who advised her to go to the ED for further evaluation and management.  Patient denies shortness of breath, diaphoresis, fever.    Assessment and Plan:  Chest pain with high risk for cardiac etiology - appreciate cardiology team consultation - continue supportive measures, IV heparin infusion - working to improve creatinine in case cath is approved - cardio team planning to premedicate for cath given history of contrast allergy  AKI on CKD stage 3b  - creatinine slowly improving with hydration - continue IV fluids to maximally hydrate in case she requires cath tomorrow - recheck in AM  - renal US done 8/3 with normal findings reported  - urinalysis reassuring  RLQ abdominal pain - persistent, lipase test came back normal - pt reports appendectomy and cholecystectomy - CT abd/pelvis without contrast ordered (due to contrast allergy and upcoming cath) - CT with findings of hepatomegaly and steatosis of liver   Chronic constipation  - pt reports having diabetic neuropathy and chronic problems with constipation  - pt has been on regular laxatives and has had a bowel movement in last 24 hours   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 - increased prandial novolog to 14 units TID  with meals - continue home detemir 50 units BID   - monitor CBG 5 times per day CBG (last 3)  Recent Labs    04/20/23 0304 04/20/23 0811 04/20/23 1147  GLUCAP 160* 151* 205*   Chronic atrial fibrillation - carvedilol for heart rate control - holding apixaban in anticipation for possible cath later this week  - IV heparin for anticoagulation   Hyperlipidemia - resumed home statin therapy  Essential Hypertension  - resumed home carvedilol with good BP control   DVT prophylaxis: IV heparin  Code Status: full  Family Communication: bedside update Disposition: possible transfer to [facility] for cath for 04/21/23 pending cardiology decision    Consultants:  cardiology Procedures:  Possible cardiac cath   Antimicrobials:    Subjective: Pt with multiple complaints but still having chest pressure/discomfort, abdominal pain, has had a BM in last 24 hours.   Objective: Vitals:   04/19/23 1700 04/19/23 1941 04/19/23 2050 04/20/23 0503  BP: 135/63 (!) 140/68 (!) 158/64 130/74  Pulse: 66 73 73 74  Resp: 16 20 20 20   Temp: 98.3 F (36.8 C) 98.3 F (36.8 C) 98.2 F (36.8 C) 98.3 F (36.8 C)  TempSrc: Oral Oral Oral Oral  SpO2: 96% 98% 98% 95%  Weight:      Height:        Intake/Output Summary (Last 24 hours) at 04/20/2023 1420 Last data filed at 04/20/2023 0900 Gross per 24 hour  Intake 2980.97 ml  Output 800 ml  Net 2180.97 ml   Filed Weights   04/15/23 2144 04/16/23 1344  Weight: 111.1  kg 111.1 kg   Examination:  General exam: Appears calm and comfortable  Respiratory system: Clear to auscultation. Respiratory effort normal. Cardiovascular system: normal S1 & S2 heard. No JVD, murmurs, rubs, gallops or clicks. No pedal edema. Gastrointestinal system: Abdomen is nondistended, soft and sharp RLQ abdominal pain. No organomegaly or masses felt. Normal bowel sounds heard. Central nervous system: Alert and oriented. No focal neurological deficits. Extremities: Symmetric 5 x 5  power. Skin: No rashes, lesions or ulcers. Psychiatry: Judgement and insight appear normal. Mood & affect appropriate.   Data Reviewed: I have personally reviewed following labs and imaging studies  CBC: Recent Labs  Lab 04/16/23 0652 04/17/23 0636 04/18/23 0438 04/19/23 0448 04/20/23 0455  WBC 4.8 3.8* 3.8* 3.8* 4.2  HGB 9.8* 9.0* 8.4* 8.2* 8.2*  HCT 30.4* 28.2* 27.3* 26.8* 27.1*  MCV 84.0 86.2 87.2 88.7 88.6  PLT 178 158 155 154 150    Basic Metabolic Panel: Recent Labs  Lab 04/16/23 0652 04/17/23 0636 04/18/23 0438 04/19/23 0448 04/20/23 0455  NA 135 134* 135 136 137  K 4.0 3.9 4.1 4.4 4.3  CL 102 104 105 107 108  CO2 23 22 23  19* 23  GLUCOSE 210* 177* 201* 200* 147*  BUN 27* 22* 20 19 20   CREATININE 1.89* 1.85* 1.96* 2.07* 2.17*  CALCIUM 8.8* 7.6* 7.7* 8.0* 7.8*  MG 1.7 1.7  --   --   --   PHOS 4.8*  --   --   --   --     CBG: Recent Labs  Lab 04/19/23 1614 04/19/23 2054 04/20/23 0304 04/20/23 0811 04/20/23 1147  GLUCAP 106* 137* 160* 151* 205*    No results found for this or any previous visit (from the past 240 hour(s)).   Radiology Studies: DG CHEST PORT 1 VIEW  Result Date: 04/20/2023 CLINICAL DATA:  [id number] Chest pain [id number] EXAM: PORTABLE CHEST - 1 VIEW COMPARISON:  04/15/2023 FINDINGS: Relatively low lung volumes with some crowding of bronchovascular structures centrally. No new infiltrate. No pneumothorax. Heart size and mediastinal contours are within normal limits. No effusion. Visualized bones unremarkable. IMPRESSION: Low lung volumes. No acute findings. Electronically Signed   By: Corlis Leak M.D.   On: 04/20/2023 10:03   US RENAL  Result Date: 04/19/2023 CLINICAL DATA:  Acute on chronic kidney injury EXAM: RENAL / URINARY TRACT ULTRASOUND COMPLETE COMPARISON:  CT abdomen pelvis 04/18/2023 FINDINGS: Right Kidney: Renal measurements: 9.4 x 4.9 x 4.4 cm = volume: 105 mL. Echogenicity within normal limits. No mass or hydronephrosis visualized.  Left Kidney: Renal measurements: 11.6 x 5.6 x 5.0 cm = volume: 169 mL. Echogenicity within normal limits. No mass or hydronephrosis visualized. Bladder: Limited evaluation due to to collapsed configuration. Other: Diffuse increased echogenicity of the visualized portions of the hepatic parenchyma are a nonspecific indicator of hepatocellular dysfunction, most commonly steatosis. IMPRESSION: No significant sonographic abnormality of the kidneys. Electronically Signed   By: Acquanetta Belling M.D.   On: 04/19/2023 10:55   CT ABDOMEN PELVIS WO CONTRAST  Result Date: 04/18/2023 CLINICAL DATA:  Right lower quadrant abdominal pain EXAM: CT ABDOMEN AND PELVIS WITHOUT CONTRAST TECHNIQUE: Multidetector CT imaging of the abdomen and pelvis was performed following the standard protocol without IV contrast. RADIATION DOSE REDUCTION: This exam was performed according to the departmental dose-optimization program which includes automated exposure control, adjustment of the mA and/or kV according to patient size and/or use of iterative reconstruction technique. COMPARISON:  None Available. FINDINGS: Lower chest: No acute findings.  Coronary artery and aortic calcifications. Hepatobiliary: Prior cholecystectomy. Diffuse fatty infiltration of the liver. No focal hepatic abnormality. Mild hepatomegaly with craniocaudal length 23 cm. Pancreas: No focal abnormality or ductal dilatation. Spleen: Splenomegaly with a craniocaudal length of 14.5 cm. This is stable since prior study. No focal abnormality. Adrenals/Urinary Tract: Punctate bilateral nonobstructing renal calculi. No ureteral stones or hydronephrosis. Adrenal glands and urinary bladder unremarkable. Stomach/Bowel: Stomach, large and small bowel grossly unremarkable. Vascular/Lymphatic: Aortic atherosclerosis. No evidence of aneurysm or adenopathy. Reproductive: Prior hysterectomy.  No adnexal masses. Other: No free fluid or free air. Musculoskeletal: No acute bony abnormality.  IMPRESSION: Hepatic steatosis.  Hepatosplenomegaly. No acute findings in the abdomen or pelvis. Aortic atherosclerosis, coronary artery disease. Punctate bilateral nephrolithiasis.  No hydronephrosis. Electronically Signed   By: Charlett Nose M.D.   On: 04/18/2023 19:22    Scheduled Meds:  aspirin EC  81 mg Oral Daily   atorvastatin  80 mg Oral q morning   carvedilol  12.5 mg Oral BID WC   clonazePAM  0.5 mg Oral TID   clopidogrel  75 mg Oral Daily   gabapentin  300 mg Oral QHS   insulin aspart  0-15 Units Subcutaneous TID WC   insulin aspart  14 Units Subcutaneous TID WC   insulin detemir  50 Units Subcutaneous BID   isosorbide mononitrate  30 mg Oral q morning   lamoTRIgine  100 mg Oral BID   polyethylene glycol  17 g Oral BID   senna-docusate  2 tablet Oral QHS   sertraline  100 mg Oral Daily   Continuous Infusions:  sodium chloride 70 mL/hr at 04/20/23 1349   heparin 1,500 Units/hr (04/20/23 0553)     LOS: 3 days   Time spent: 38 mins   Laural Benes, MD How to contact the [facility] Attending or Consulting provider 7A - 7P or covering provider during after hours 7P -7A, for this patient?  Check the care team in [facility] and look for a) attending/consulting TRH provider listed and b) the [facility] team listed Log into [email or web address] and use [place]'s universal password to access. If you do not have the password, please contact the hospital operator. Locate the [facility] provider you are looking for under [facility] and page to a number that you can be directly reached. If you still have difficulty reaching the provider, please page the [facility] (Director on Call) for the Hospitalists listed on amion for assistance.  04/20/2023, 2:20 PM

## 2023-04-21 ENCOUNTER — Inpatient Hospital Stay (HOSPITAL_COMMUNITY): Payer: Medicaid - Out of State

## 2023-04-21 DIAGNOSIS — D649 Anemia, unspecified: Secondary | ICD-10-CM

## 2023-04-21 DIAGNOSIS — N179 Acute kidney failure, unspecified: Secondary | ICD-10-CM | POA: Diagnosis not present

## 2023-04-21 DIAGNOSIS — R0789 Other chest pain: Secondary | ICD-10-CM | POA: Diagnosis not present

## 2023-04-21 DIAGNOSIS — E1165 Type 2 diabetes mellitus with hyperglycemia: Secondary | ICD-10-CM

## 2023-04-21 DIAGNOSIS — I200 Unstable angina: Secondary | ICD-10-CM | POA: Diagnosis not present

## 2023-04-21 DIAGNOSIS — N1832 Chronic kidney disease, stage 3b: Secondary | ICD-10-CM | POA: Diagnosis not present

## 2023-04-21 DIAGNOSIS — E118 Type 2 diabetes mellitus with unspecified complications: Secondary | ICD-10-CM | POA: Diagnosis not present

## 2023-04-21 DIAGNOSIS — E782 Mixed hyperlipidemia: Secondary | ICD-10-CM | POA: Diagnosis not present

## 2023-04-21 DIAGNOSIS — I5033 Acute on chronic diastolic (congestive) heart failure: Secondary | ICD-10-CM | POA: Diagnosis not present

## 2023-04-21 DIAGNOSIS — R079 Chest pain, unspecified: Secondary | ICD-10-CM | POA: Diagnosis not present

## 2023-04-21 DIAGNOSIS — Z794 Long term (current) use of insulin: Secondary | ICD-10-CM

## 2023-04-21 LAB — FERRITIN: Ferritin: 19 ng/mL (ref 11–307)

## 2023-04-21 LAB — TYPE AND SCREEN
ABO/RH(D): O POS
Antibody Screen: NEGATIVE
Unit division: 0

## 2023-04-21 LAB — URINALYSIS, ROUTINE W REFLEX MICROSCOPIC
Bilirubin Urine: NEGATIVE
Glucose, UA: NEGATIVE mg/dL
Hgb urine dipstick: NEGATIVE
Ketones, ur: NEGATIVE mg/dL
Leukocytes,Ua: NEGATIVE
Nitrite: NEGATIVE
Protein, ur: NEGATIVE mg/dL
Specific Gravity, Urine: 1.006 (ref 1.005–1.030)
pH: 5 (ref 5.0–8.0)

## 2023-04-21 LAB — BRAIN NATRIURETIC PEPTIDE: B Natriuretic Peptide: 183 pg/mL — ABNORMAL HIGH (ref 0.0–100.0)

## 2023-04-21 LAB — GLUCOSE, CAPILLARY
Glucose-Capillary: 127 mg/dL — ABNORMAL HIGH (ref 70–99)
Glucose-Capillary: 107 mg/dL — ABNORMAL HIGH (ref 70–99)
Glucose-Capillary: 116 mg/dL — ABNORMAL HIGH (ref 70–99)
Glucose-Capillary: 183 mg/dL — ABNORMAL HIGH (ref 70–99)
Glucose-Capillary: 196 mg/dL — ABNORMAL HIGH (ref 70–99)

## 2023-04-21 LAB — BPAM RBC
Blood Product Expiration Date: 202409032359
ISSUE DATE / TIME: 202408051522
Unit Type and Rh: 5100

## 2023-04-21 LAB — PREPARE RBC (CROSSMATCH)

## 2023-04-21 LAB — IRON AND TIBC
Iron: 32 ug/dL (ref 28–170)
Saturation Ratios: 10 % — ABNORMAL LOW (ref 10.4–31.8)
TIBC: 313 ug/dL (ref 250–450)
UIBC: 281 ug/dL

## 2023-04-21 LAB — ABO/RH: ABO/RH(D): O POS

## 2023-04-21 MED ORDER — SODIUM CHLORIDE 0.9% IV SOLUTION: Freq: Once | INTRAVENOUS | Status: AC

## 2023-04-21 MED ORDER — ADULT MULTIVITAMIN W/MINERALS CH
1.0000 | ORAL_TABLET | Freq: Every day | ORAL | Status: DC
  Administered 2023-04-21 – 2023-05-03 (×13): 1 via ORAL
  Filled 2023-04-21 (×13): qty 1

## 2023-04-21 MED ORDER — FUROSEMIDE 10 MG/ML IJ SOLN
60.0000 mg | Freq: Once | INTRAMUSCULAR | Status: AC
  Administered 2023-04-21: 60 mg via INTRAVENOUS
  Filled 2023-04-21: qty 6

## 2023-04-21 NOTE — Plan of Care (Signed)

## 2023-04-21 NOTE — Progress Notes (Signed)
Initial Nutrition Assessment  DOCUMENTATION CODES:   Morbid obesity  INTERVENTION:   -Magic cup BID with meals, each supplement provides 290 kcal and 9 grams of protein  -MVI with minerals daily -Provided pt with "Heart Healthy, Consistent Carbohydrate Nutrition Therapy" handout from [facility] Nutrition Care Manual; attached to AVS/ discharge summary -RD referred pt to [place] for further sup;port and reinforcement at discharge   NUTRITION DIAGNOSIS:   Increased nutrient needs related to acute illness as evidenced by estimated needs.  GOAL:   Patient will meet greater than or equal to 90% of their needs  MONITOR:   PO intake, Supplement acceptance  REASON FOR ASSESSMENT:   Malnutrition Screening Tool    ASSESSMENT:   Pt with medical history significant of CAD s/p PCI, hypertension, hyperlipidemia, T2DM, DVT on Eliquis who presents with complaints of chest pain.  Pt admitted with chest pain and AKI on CKD stage 3.    Reviewed I/O's: +355 ml x 24 hours and +8.6 L since admission  UOP: 800 ml x 24 hours  Pt unavailable at time of visit. Attempted to speak with pt via call to hospital room phone, however, unable to reach. RD unable to obtain further nutrition-related history or complete nutrition-focused physical exam at this time.    Per MD notes, plan to continue to improve renal status for cardiac cath.   Pt on a heart healthy, carb modified diet. Noted meal completions 50-100%.   Reviewed wt hx; wt has been stable over the past 6 months. Noted pt with moderate edema, which may be masking true weight loss as well as fat and muscle depletions.   Obesity is a complex, chronic medical condition that is optimally managed by a multidisciplinary care team. Weight loss is not an ideal goal for an acute inpatient hospitalization. However, if further work-up for obesity is warranted, consider outpatient referral to [place]  [facility].    Medications reviewed and include lasix, miralax, and senokot.   Lab Results  Component Value Date   HGBA1C 9.1 (H) 04/15/2023   PTA DM medications are 50 units insulin detemir BID and 20 units insulin aspart 4 times daily. Unsure about medications compliance. Per TOC notes, pt with [facility] and has about 30 days worth of medications left; pt is unsure if she will relocate back to [place] at this time.   Labs reviewed: CBGS: 116 (inpatient orders for glycemic control are 0-15 units insulin aspart TID with meals, 14 units insulin aspart TID with meals, and 50 units insulin detemir BID).    Diet Order:   Diet Order             Diet heart healthy/carb modified Fluid consistency: Thin  Diet effective now                   EDUCATION NEEDS:   No education needs have been identified at this time  Skin:  Skin Assessment: Reviewed RN Assessment  Last BM:  04/19/23  Height:   Ht Readings from Last 1 Encounters:  04/16/23 5\' 3"  (1.6 m)    Weight:   Wt Readings from Last 1 Encounters:  04/16/23 111.1 kg    Ideal Body Weight:  52.3 kg  BMI:  Body mass index is 43.4 kg/m.  Estimated Nutritional Needs:   Kcal:  1550-1750  Protein:  80-95 grams  Fluid:  > 1.5 L    Levada Schilling, RD, LDN, CDCES Registered Dietitian  II Certified Diabetes Care and Education Specialist Please refer to [facility] for RD and/or RD on-call/weekend/after hours pager

## 2023-04-21 NOTE — Discharge Instructions (Signed)

## 2023-04-21 NOTE — Progress Notes (Signed)
PROGRESS NOTE  [name]  WUJ:[id number] DOB: Feb 28, 1961 DOA: 04/15/2023 PCP: Patient, No Pcp Per   Chief Complaint  Patient presents with   Chest Pain   Level of care: Telemetry  Brief Admission History:  62 y.o. female with medical history significant of CAD s/p PCI, hypertension, hyperlipidemia, T2DM, DVT on Eliquis who presents to the emergency department with complaints of chest pain.  Patient complained of left-sided chest pain which occurred yesterday while sitting in a car chest pain was described as sharp and nonreproducible and was rated as 8/10 on pain scale with radiation to the back, this was associated with dizziness and nausea without vomiting.  She states that she called her PCP who advised her to go to the ED for further evaluation and management.  Patient denies shortness of breath, diaphoresis, fever.    Assessment and Plan:  Chest pain with high risk for cardiac etiology - appreciate cardiology team consultation - continue supportive measures, IV heparin infusion - working to improve creatinine in case cath is approved - cardio team planning to premedicate for cath given history of contrast allergy  AKI on CKD stage 3b  - creatinine slowly improving with hydration - continue IV fluids to maximally hydrate in case she requires cath tomorrow - recheck in AM  - renal US done 8/3 with normal findings reported  - urinalysis reassuring - appreciate nephrology consultation   Anemia in CKD  - Hg down to 7.7 with hydration - with her CAD, cardiology team want her Hg>8 - transfuse 1 unit PRBC  - recheck CBC in AM    RLQ abdominal pain - persistent, lipase test came back normal - pt reports appendectomy and cholecystectomy - CT abd/pelvis without contrast ordered (due to contrast allergy and upcoming cath) - CT with findings of hepatomegaly and steatosis of liver   Chronic constipation  - pt reports having diabetic neuropathy and chronic problems with  constipation  - pt has been on regular laxatives and has had a bowel movement in last 24 hours   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 - increased prandial novolog to 14 units TID with meals - continue home detemir 50 units BID   - monitor CBG 5 times per day CBG (last 3)  Recent Labs    04/20/23 2043 04/21/23 0315 04/21/23 0736  GLUCAP 109* 127* 107*   Chronic atrial fibrillation - carvedilol for heart rate control - holding apixaban in anticipation for possible cath later this week  - IV heparin for anticoagulation   Hyperlipidemia - resumed home statin therapy  Essential Hypertension  - resumed home carvedilol with good BP control   DVT prophylaxis: IV heparin  Code Status: full  Family Communication: bedside update Disposition: anticipate transfer to [facility] for cath when medically ready   Consultants:  cardiology Procedures:  Possible cardiac cath   Antimicrobials:    Subjective: Still with multiple complaints, but ongoing chest pain and chest pressure and occasional flank pain; nausea at times.    Objective: Vitals:   04/20/23 0503 04/20/23 1500 04/20/23 2012 04/21/23 0428  BP: 130/74 (!) 146/62 (!) 146/66 (!) 144/70  Pulse: 74 70 71 77  Resp: 20 18 18 20   Temp: 98.3 F (36.8 C)  98.4 F (36.9 C) 97.9 F (36.6 C)  TempSrc: Oral  Oral   SpO2: 95% 98% 96% 98%  Weight:      Height:        Intake/Output Summary (Last 24 hours) at 04/21/2023 1148 Last  data filed at 04/20/2023 1738 Gross per 24 hour  Intake 914.53 ml  Output --  Net 914.53 ml   Filed Weights   04/15/23 2144 04/16/23 1344  Weight: 111.1 kg 111.1 kg   Examination:  General exam: Appears calm and comfortable  Respiratory system: Clear to auscultation. Respiratory effort normal. Cardiovascular system: normal S1 & S2 heard. No JVD, murmurs, rubs, gallops or clicks. No pedal edema. Gastrointestinal system: Abdomen is nondistended, soft and sharp RLQ abdominal pain. No organomegaly  or masses felt. Normal bowel sounds heard. Central nervous system: Alert and oriented. No focal neurological deficits. Extremities: Symmetric 5 x 5 power. Skin: No rashes, lesions or ulcers. Psychiatry: Judgement and insight appear normal. Mood & affect appropriate.   Data Reviewed: I have personally reviewed following labs and imaging studies  CBC: Recent Labs  Lab 04/17/23 0636 04/18/23 0438 04/19/23 0448 04/20/23 0455 04/21/23 0449  WBC 3.8* 3.8* 3.8* 4.2 3.9*  HGB 9.0* 8.4* 8.2* 8.2* 7.6*  HCT 28.2* 27.3* 26.8* 27.1* 25.0*  MCV 86.2 87.2 88.7 88.6 89.0  PLT 158 155 154 150 145*    Basic Metabolic Panel: Recent Labs  Lab 04/16/23 0652 04/17/23 0636 04/18/23 0438 04/19/23 0448 04/20/23 0455 04/21/23 0449  NA 135 134* 135 136 137 136  K 4.0 3.9 4.1 4.4 4.3 4.3  CL 102 104 105 107 108 111  CO2 23 22 23  19* 23 21*  GLUCOSE 210* 177* 201* 200* 147* 129*  BUN 27* 22* 20 19 20 18   CREATININE 1.89* 1.85* 1.96* 2.07* 2.17* 2.25*  CALCIUM 8.8* 7.6* 7.7* 8.0* 7.8* 7.7*  MG 1.7 1.7  --   --   --  1.8  PHOS 4.8*  --   --   --   --   --     CBG: Recent Labs  Lab 04/20/23 1147 04/20/23 1634 04/20/23 2043 04/21/23 0315 04/21/23 0736  GLUCAP 205* 118* 109* 127* 107*    No results found for this or any previous visit (from the past 240 hour(s)).   Radiology Studies: DG CHEST PORT 1 VIEW  Result Date: 04/20/2023 CLINICAL DATA:  [id number] Chest pain [id number] EXAM: PORTABLE CHEST - 1 VIEW COMPARISON:  04/15/2023 FINDINGS: Relatively low lung volumes with some crowding of bronchovascular structures centrally. No new infiltrate. No pneumothorax. Heart size and mediastinal contours are within normal limits. No effusion. Visualized bones unremarkable. IMPRESSION: Low lung volumes. No acute findings. Electronically Signed   By: Corlis Leak M.D.   On: 04/20/2023 10:03    Scheduled Meds:  aspirin EC  81 mg Oral Daily   atorvastatin  80 mg Oral q morning   carvedilol  12.5 mg Oral BID  WC   clonazePAM  0.5 mg Oral TID   clopidogrel  75 mg Oral Daily   gabapentin  300 mg Oral QHS   insulin aspart  0-15 Units Subcutaneous TID WC   insulin aspart  14 Units Subcutaneous TID WC   insulin detemir  50 Units Subcutaneous BID   isosorbide mononitrate  30 mg Oral q morning   lamoTRIgine  100 mg Oral BID   polyethylene glycol  17 g Oral BID   senna-docusate  2 tablet Oral QHS   sertraline  100 mg Oral Daily   Continuous Infusions:  heparin 1,650 Units/hr (04/21/23 0838)     LOS: 4 days   Time spent: 35 mins   Laural Benes, MD How to contact the [facility] Attending or Consulting provider 7A - 7P or covering provider  during after hours 7P -7A, for this patient?  Check the care team in [facility] and look for a) attending/consulting TRH provider listed and b) the [facility] team listed Log into [email or web address] and use [place]'s universal password to access. If you do not have the password, please contact the hospital operator. Locate the [facility] provider you are looking for under [facility] and page to a number that you can be directly reached. If you still have difficulty reaching the provider, please page the [facility] (Director on Call) for the Hospitalists listed on amion for assistance.  04/21/2023, 11:48 AM

## 2023-04-21 NOTE — Consult Note (Signed)
Nephrology Consult   Assessment/Recommendations:   AKI on CKD3 -Baseline creatinine typically around 1.11.4.  Up to 2.25 today. It is possible that her normal saline is causing some worsening AKI along with worsening anemia. Will stop NS and hold off on fluids for now (has been on fluids since 7/31) -renal ultrasound without any significant obstruction. Initial UA unremarkable but without microscopy, will repeat. If any abnormalities then may need to get another CT (renal stone) to determine if she is passing a stone or now -hopefully can undergo cardiac cath once Cr improves/stabilizes. May very well need LR around/during contrast exposure -Avoid nephrotoxic medications including NSAIDs and iodinated intravenous contrast exposure unless the latter is absolutely indicated.  Preferred narcotic agents for pain control are hydromorphone, fentanyl, and methadone. Morphine should not be used. Avoid Baclofen and avoid oral sodium phosphate and magnesium citrate based laxatives / bowel preps. Continue strict Input and Output monitoring. Will monitor the patient closely with you and intervene or adjust therapy as indicated by changes in clinical status/labs   Chest pain -high risk for cardiac etiology. Cardiology following, currently on DAPT and heparin gtt. Pending cath provided Cr improves  HTN -BP reasonably controlled  Anemia -Transfuse for Hgb<7 g/dL -downtrending. Of note, has been receiving DAPT and heparin. Would recommend at least checking a FOBT.  Of note she has a self-described history of receiving IV iron infusions back in [place] in the past  Uncontrolled Diabetes Mellitus Type 2 with Hyperglycemia -per primary  Abdominal pain -repeat UA with microscopy. Depending on results, may need a CT renal stone? -per primary  Recommendations conveyed to primary service.    Anthony Sar [facility] 04/21/2023 7:39  AM   _____________________________________________________________________________________   History of Present Illness: [name] is a/an 62 y.o. female with a past medical history of CKD, hypertension, CAD status post PCI, DM 2, DVT on Eliquis, A-fib, obesity, hyperlipidemia who presents to [place] with chest pain.  Was associated with dizziness, nausea without vomiting.  Also had dyspnea on exertion.  Cardiology has been consulted, and she has high risk therefore needs a cardiac catheterization.  Her creatinine on presentation was 1.85, up to 2.25 today.  She has been hydrated with NS.  She did have a CT abdomen pelvis without contrast on 8/2 which revealed small bilateral punctate nephrolithiasis.  Renal ultrasound was unremarkable on 8/3. Patient seen and examined bedside.  She reports ongoing issues of right-sided abdominal pain, described as starting from her right upper back, wrapping to her right lower quadrant anteriorly.  She also feels like she is having some slight swelling now.  She denies any changes with urinary frequency, dysuria, hematuria, fevers, chills, shortness of breath, nausea/vomiting/diarrhea, loss of appetite, recent NSAID use. She does report intermittent dark stools, she self reports a history of what sounds like an upper GI bleed and had been instructed in the past to not take NSAIDs.   Medications:  Current Facility-Administered Medications  Medication Dose Route Frequency Provider Last Rate Last Admin   0.9 %  sodium chloride infusion   Intravenous Continuous Laural Benes, Clanford L, MD 70 mL/hr at 04/20/23 1738 Infusion Verify at 04/20/23 1738   acetaminophen (TYLENOL) tablet 650 mg  650 mg Oral Q6H PRN Adefeso, Oladapo, DO   650 mg at 04/19/23 1610   Or   acetaminophen (TYLENOL) suppository 650 mg  650 mg Rectal Q6H PRN Adefeso, Oladapo, DO       aspirin EC tablet 81 mg  81 mg Oral  Daily Randall An M, PA-C   81 mg at 04/20/23 1000   atorvastatin (LIPITOR)  tablet 80 mg  80 mg Oral q morning Randall An M, PA-C   80 mg at 04/20/23 1000   bisacodyl (DULCOLAX) suppository 10 mg  10 mg Rectal Daily PRN Laural Benes, Clanford L, MD       carvedilol (COREG) tablet 12.5 mg  12.5 mg Oral BID WC Strader, [place] M, PA-C   12.5 mg at 04/20/23 1803   clonazePAM (KLONOPIN) tablet 0.5 mg  0.5 mg Oral TID Laural Benes, Clanford L, MD   0.5 mg at 04/20/23 2131   clopidogrel (PLAVIX) tablet 75 mg  75 mg Oral Daily Adefeso, Oladapo, DO   75 mg at 04/20/23 0959   diphenhydrAMINE (BENADRYL) capsule 25 mg  25 mg Oral QHS PRN Johnson, Clanford L, MD       fentaNYL (SUBLIMAZE) injection 25 mcg  25 mcg Intravenous Q2H PRN Johnson, Clanford L, MD   25 mcg at 04/21/23 0648   gabapentin (NEURONTIN) capsule 300 mg  300 mg Oral QHS Johnson, Clanford L, MD   300 mg at 04/20/23 2131   heparin ADULT infusion 100 units/mL (25000 units/284mL)  1,500 Units/hr Intravenous Continuous Johnson, Clanford L, MD 15 mL/hr at 04/21/23 0002 1,500 Units/hr at 04/21/23 0002   insulin aspart (novoLOG) injection 0-15 Units  0-15 Units Subcutaneous TID WC Johnson, Clanford L, MD   5 Units at 04/20/23 1341   insulin aspart (novoLOG) injection 14 Units  14 Units Subcutaneous TID WC Johnson, Clanford L, MD   14 Units at 04/20/23 1804   insulin detemir (LEVEMIR) injection 50 Units  50 Units Subcutaneous BID Laural Benes, Clanford L, MD   50 Units at 04/20/23 2130   isosorbide mononitrate (IMDUR) 24 hr tablet 30 mg  30 mg Oral q morning Johnson, Clanford L, MD   30 mg at 04/20/23 0959   lamoTRIgine (LAMICTAL) tablet 100 mg  100 mg Oral BID Laural Benes, Clanford L, MD   100 mg at 04/20/23 2131   ondansetron (ZOFRAN) tablet 4 mg  4 mg Oral Q6H PRN Adefeso, Oladapo, DO   4 mg at 04/20/23 1604   Or   ondansetron (ZOFRAN) injection 4 mg  4 mg Intravenous Q6H PRN Adefeso, Oladapo, DO   4 mg at 04/21/23 8119   Oral care mouth rinse  15 mL Mouth Rinse PRN Johnson, Clanford L, MD       oxyCODONE (Oxy IR/ROXICODONE)  immediate release tablet 5 mg  5 mg Oral Q6H PRN Johnson, Clanford L, MD   5 mg at 04/21/23 0309   polyethylene glycol (MIRALAX / GLYCOLAX) packet 17 g  17 g Oral BID Johnson, Clanford L, MD   17 g at 04/20/23 2130   prochlorperazine (COMPAZINE) injection 10 mg  10 mg Intravenous Q4H PRN Laural Benes, Clanford L, MD   10 mg at 04/18/23 1649   senna-docusate (Senokot-S) tablet 2 tablet  2 tablet Oral QHS Johnson, Clanford L, MD   2 tablet at 04/20/23 2130   sertraline (ZOLOFT) tablet 100 mg  100 mg Oral Daily Johnson, Clanford L, MD   100 mg at 04/20/23 1478     ALLERGIES Solu-medrol [methylprednisolone sodium succ], Contrast media [iodinated contrast media], Doxycycline, Ibuprofen, Keflex [cephalexin], Metformin and related, Nitroglycerin, Nsaids, Shellfish allergy, Sulfa antibiotics, Toradol [ketorolac tromethamine], Bentyl [dicyclomine hcl], Blueberry flavor, Nitrofurantoin monohyd macro, and Strawberry flavor  MEDICAL HISTORY Past Medical History:  Diagnosis Date   Asthma    Atrial fibrillation (HCC)  Bipolar 1 disorder (HCC)    Chronic back pain    Chronic chest pain    Diabetes mellitus without complication (HCC)    Gastroparesis    GERD (gastroesophageal reflux disease)    Hyperlipemia    Hypertension    IBS (irritable bowel syndrome)    Normal cardiac stress test 02/2014   [facility]     SOCIAL HISTORY Social History   Socioeconomic History   Marital status: Married    Spouse name: Not on file   Number of children: 4   Years of education: Not on file   Highest education level: Not on file  Occupational History   Occupation: disabled  Tobacco Use   Smoking status: Passive Smoke Exposure - Never Smoker   Smokeless tobacco: Never  Vaping Use   Vaping status: Never Used  Substance and Sexual Activity   Alcohol use: No   Drug use: No   Sexual activity: Not on file  Other Topics Concern   Not on file  Social History Narrative   Lives w/ husband, daughter,  son-in-law & grandkids   Social Determinants of Health   Financial Resource Strain: Not on file  Food Insecurity: No Food Insecurity (04/16/2023)   Hunger Vital Sign    Worried About Running Out of Food in the Last Year: Never true    Ran Out of Food in the Last Year: Never true  Transportation Needs: Unmet Transportation Needs (04/16/2023)   PRAPARE - Administrator, Civil Service (Medical): Yes    Lack of Transportation (Non-Medical): Yes  Physical Activity: Not on file  Stress: Not on file  Social Connections: Not on file  Intimate Partner Violence: Not At Risk (04/16/2023)   Humiliation, Afraid, Rape, and Kick questionnaire    Fear of Current or Ex-Partner: No    Emotionally Abused: No    Physically Abused: No    Sexually Abused: No     FAMILY HISTORY Family History  Problem Relation Age of Onset   Cirrhosis Mother 81       ?etiology   Cirrhosis Father        ?etiology   Cirrhosis Sister 30       ?etiology   Diverticulitis Sister      Review of Systems: 12 systems reviewed Otherwise as per HPI, all other systems reviewed and negative  Physical Exam: Vitals:   04/20/23 2012 04/21/23 0428  BP: (!) 146/66 (!) 144/70  Pulse: 71 77  Resp: 18 20  Temp: 98.4 F (36.9 C) 97.9 F (36.6 C)  SpO2: 96% 98%   No intake/output data recorded.  Intake/Output Summary (Last 24 hours) at 04/21/2023 0739 Last data filed at 04/20/2023 1738 Gross per 24 hour  Intake 1154.53 ml  Output 800 ml  Net 354.53 ml   General: well-appearing, no acute distress HEENT: anicteric sclera, oropharynx clear without lesions CV: regular rate, normal rhythm, no murmurs, no gallops, no rubs Lungs: clear to auscultation bilaterally, normal work of breathing Abd: TTP rt lower quadrant, soft, non-distended Skin: no visible lesions or rashes Psych: alert, engaged, appropriate mood and affect Musculoskeletal: no obvious deformities, no significant b/l LE edema Neuro: normal speech, no  gross focal deficits   Test Results Reviewed Lab Results  Component Value Date   NA 136 04/21/2023   K 4.3 04/21/2023   CL 111 04/21/2023   CO2 21 (L) 04/21/2023   BUN 18 04/21/2023   CREATININE 2.25 (H) 04/21/2023   CALCIUM 7.7 (L) 04/21/2023  ALBUMIN 3.3 (L) 04/16/2023   PHOS 4.8 (H) 04/16/2023     I have reviewed all relevant outside healthcare records related to the patient's kidney injury.

## 2023-04-21 NOTE — Progress Notes (Signed)
   04/21/23 1854  Vitals  Vital Signs Type (Include Temp, Pulse, RR, and B/P) 15 min. post blood start  Temp 98.4 F (36.9 C)  Temp Source Oral  Pulse Rate 68  Resp 20  BP (!) 158/103  Oxygen Therapy  SpO2 98 %  O2 Device Room Air

## 2023-04-21 NOTE — Plan of Care (Signed)
  Problem: Education: Goal: Knowledge of General Education information will improve Description: Including pain rating scale, medication(s)/side effects and non-pharmacologic comfort measures 04/21/2023 0732 by Sundra Aland, RN Outcome: Progressing 04/21/2023 0731 by Sundra Aland, RN Outcome: Progressing   Problem: Coping: Goal: Level of anxiety will decrease 04/21/2023 0732 by Sundra Aland, RN Outcome: Progressing 04/21/2023 0731 by Sundra Aland, RN Outcome: Progressing   Problem: Pain Managment: Goal: General experience of comfort will improve Outcome: Progressing   Problem: Safety: Goal: Ability to remain free from injury will improve 04/21/2023 0732 by Sundra Aland, RN Outcome: Progressing 04/21/2023 0731 by Sundra Aland, RN Outcome: Progressing

## 2023-04-21 NOTE — Progress Notes (Signed)
ANTICOAGULATION CONSULT NOTE   Pharmacy Consult for Heparin Indication: chest pain/ACS   Patient Measurements: Height: 5\' 3"  (160 cm) Weight: 111.1 kg (245 lb) IBW/kg (Calculated) : 52.4 HEPARIN DW (KG): 79.2   Vital Signs: Temp: 97.9 F (36.6 C) (08/05 0428) Temp Source: Oral (08/04 2012) BP: 144/70 (08/05 0428) Pulse Rate: 77 (08/05 0428)  Labs: Recent Labs    04/19/23 0448 04/20/23 0455 04/21/23 0449  HGB 8.2* 8.2* 7.6*  HCT 26.8* 27.1* 25.0*  PLT 154 150 145*  HEPARINUNFRC 0.33 0.30 0.24*  CREATININE 2.07* 2.17* 2.25*    Estimated Creatinine Clearance: 31.9 mL/min (A) (by C-G formula based on SCr of 2.25 mg/dL (H)).   Medical History: Past Medical History:  Diagnosis Date   Asthma    Atrial fibrillation (HCC)    Bipolar 1 disorder (HCC)    Chronic back pain    Chronic chest pain    Diabetes mellitus without complication (HCC)    Gastroparesis    GERD (gastroesophageal reflux disease)    Hyperlipemia    Hypertension    IBS (irritable bowel syndrome)    Normal cardiac stress test 02/2014   [facility]   Assessment: Patient present with chest pain with atypical features, Dyspnea on exertion and diaphoresis. 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 Cardiology plans to do cath and is bridging with heparin.  Heparin level dropped slightly below goal this morning to 0.24. Hemoglobin has also saw a steady decline from high 9s to now 7.6, no bleeding noted.   Goal of Therapy:  Heparin level 0.3-0.7 units/ml Monitor platelets by anticoagulation protocol: Yes   Plan:  Increase heparin infusion to 1650/hr Check anti-Xa level in am Continue to monitor H&H and platelets  Sheppard Coil PharmD., BCPS Clinical Pharmacist 04/21/2023 7:59 AM

## 2023-04-21 NOTE — Evaluation (Signed)
Physical Therapy Evaluation Patient Details Name: [name] MRN: [id number] DOB: [birth year] Today's Date: 04/21/2023  History of Present Illness  62 y.o. female with medical history significant of CAD s/p PCI, hypertension, hyperlipidemia, T2DM, DVT on Eliquis who presents to the emergency department with complaints of chest pain.  Patient complained of left-sided chest pain which occurred yesterday while sitting in a car chest pain was described as sharp and nonreproducible and was rated as 8/10 on pain scale with radiation to the back, this was associated with dizziness and nausea without vomiting.  She states that she called her PCP who advised her to go to the ED for further evaluation and management.  Patient denies shortness of breath, diaphoresis, fever.   Clinical Impression  Pt admitted with above diagnosis. Patient sitting on toilet in bathroom upon therapist arrival. Patient agreeable to participating in evaluation today. Patient performs bed mobility modified independent. Patient requires supervision to min assist for transfers and ambulation in unfamiliar environment and for IV line management. Patient ambulated demonstrating a slow, labored cadence using IV pole and reaching for objects. Patient required 3 standing rest breaks during ambulation with complaints of dyspnea on exertion and pain in legs as ambulating. Patient more steady with RW while therapist managed IV pole reducing her risk for falls. Patient limited by fatigue Pt currently with functional limitations due to the deficits listed below (see PT Problem List). Pt will benefit from acute skilled PT to increase their independence and safety with mobility to allow discharge.           If plan is discharge home, recommend the following: A little help with walking and/or transfers;Help with stairs or ramp for entrance;Assistance with cooking/housework   Can travel by private vehicle        Equipment Recommendations  Rolling walker (2 wheels)  Recommendations for Other Services       Functional Status Assessment Patient has had a recent decline in their functional status and demonstrates the ability to make significant improvements in function in a reasonable and predictable amount of time.     Precautions / Restrictions Precautions Precautions: Fall Precaution Comments: patient reports 3 falls in the last six months Restrictions Weight Bearing Restrictions: No      Mobility  Bed Mobility Overal bed mobility: Modified Independent          Transfers Overall transfer level: Needs assistance Equipment used: Ambulation equipment used Transfers: Sit to/from Stand Sit to Stand: Supervision, Min guard           General transfer comment: assistance for safety in unfamiliar environment and for IV line management    Ambulation/Gait Ambulation/Gait assistance: Min guard Gait Distance (Feet): 100 Feet Assistive device: IV Pole, Rolling walker (2 wheels) Gait Pattern/deviations: Step-through pattern, Decreased step length - right, Decreased step length - left, Decreased stride length, Wide base of support Gait velocity: decreased     General Gait Details: slow, labored cadence using IV pole and reaching for objects; 3 standing rest breaks and complaints of dyspnea on exertion and pain in legs as ambulating; more steady with RW while therapist managed IV pole; limited by fatigue  Stairs            Wheelchair Mobility     Tilt Bed    Modified Rankin (Stroke Patients Only)       Balance Overall balance assessment: History of Falls, Needs assistance         Standing balance support: Bilateral upper extremity supported, During  functional activity, Reliant on assistive device for balance Standing balance-Leahy Scale: Fair Standing balance comment: fair with RW         Pertinent Vitals/Pain Pain Assessment Pain Assessment: 0-10 Pain Score: 7  Pain Location: chest and  bilateral lower legs Pain Descriptors / Indicators: Aching Pain Intervention(s): Limited activity within patient's tolerance, Monitored during session, Repositioned    Home Living Family/patient expects to be discharged to:: Private residence Living Arrangements: Spouse/significant other;Children Available Help at Discharge: Family;Available 24 hours/day Type of Home: House Home Access: Stairs to enter Entrance Stairs-Rails: None Entrance Stairs-Number of Steps: 2   Home Layout: One level Home Equipment: Grab bars - tub/shower      Prior Function Prior Level of Function : Needs assist;History of Falls (last six months);Driving       Physical Assist : ADLs (physical)   ADLs (physical): IADLs Mobility Comments: limited to household distances; uses scooter at stores       Hand Dominance        Extremity/Trunk Assessment   Upper Extremity Assessment Upper Extremity Assessment: Overall WFL for tasks assessed    Lower Extremity Assessment Lower Extremity Assessment: Overall WFL for tasks assessed    Cervical / Trunk Assessment Cervical / Trunk Assessment: Normal  Communication   Communication: No difficulties  Cognition Arousal/Alertness: Awake/alert Behavior During Therapy: WFL for tasks assessed/performed Overall Cognitive Status: Within Functional Limits for tasks assessed            General Comments      Exercises     Assessment/Plan    PT Assessment Patient needs continued PT services  PT Problem List Decreased knowledge of use of DME;Decreased activity tolerance;Decreased balance;Pain;Decreased mobility       PT Treatment Interventions DME instruction;Balance training;Gait training;Stair training;Functional mobility training;Patient/family education;Therapeutic activities;Therapeutic exercise    PT Goals (Current goals can be found in the Care Plan section)  Acute Rehab PT Goals Patient Stated Goal: Go home and feel better. PT Goal Formulation:  With patient Time For Goal Achievement: 05/05/23 Potential to Achieve Goals: Fair    Frequency Min 3X/week        AM-PAC PT "6 Clicks" Mobility  Outcome Measure Help needed turning from your back to your side while in a flat bed without using bedrails?: None Help needed moving from lying on your back to sitting on the side of a flat bed without using bedrails?: A Little Help needed moving to and from a bed to a chair (including a wheelchair)?: A Little Help needed standing up from a chair using your arms (e.g., wheelchair or bedside chair)?: A Little Help needed to walk in hospital room?: A Little Help needed climbing 3-5 steps with a railing? : A Little 6 Click Score: 19    End of Session Equipment Utilized During Treatment: Gait belt Activity Tolerance: Patient limited by fatigue Patient left: in chair;with call bell/phone within reach Nurse Communication: Mobility status PT Visit Diagnosis: Unsteadiness on feet (R26.81);History of falling (Z91.81);Other abnormalities of gait and mobility (R26.89)    Time: 3086-5784 PT Time Calculation (min) (ACUTE ONLY): 27 min   Charges:   PT Evaluation $PT Eval Low Complexity: 1 Low PT Treatments $Therapeutic Activity: 8-22 mins PT General Charges $$ ACUTE PT VISIT: 1 Visit        Katina Dung. Hartnett-Rands, MS, PT [facility] [phone]  Britta Mccreedy  Hartnett-Rands 04/21/2023, 12:56 PM

## 2023-04-21 NOTE — Plan of Care (Signed)
  Problem: Acute Rehab PT Goals(only PT should resolve) Goal: Patient Will Transfer Sit To/From Stand Outcome: Progressing Flowsheets (Taken 04/21/2023 1300) Patient will transfer sit to/from stand: with supervision Goal: Pt Will Transfer Bed To Chair/Chair To Bed Outcome: Progressing Flowsheets (Taken 04/21/2023 1300) Pt will Transfer Bed to Chair/Chair to Bed: with supervision Goal: Pt Will Ambulate Outcome: Progressing Flowsheets (Taken 04/21/2023 1300) Pt will Ambulate:  > 125 feet  with least restrictive assistive device Goal: Pt Will Go Up/Down Stairs Outcome: Progressing Flowsheets (Taken 04/21/2023 1300) Pt will Go Up / Down Stairs:  1-2 stairs  with min guard assist  with least restrictive assistive device Goal: Pt/caregiver will Perform Home Exercise Program Outcome: Progressing Flowsheets (Taken 04/21/2023 1300) Pt/caregiver will Perform Home Exercise Program:  For increased strengthening  For improved balance  With Supervision, verbal cues required/provided   Katina Dung. Hartnett-Rands, MS, PT [facility] [phone] 04/21/2023

## 2023-04-21 NOTE — Progress Notes (Signed)
Attempted x 2 , REDS clip keeps saying low quality . Notified Dr. Laural Benes   04/21/23 1100  ReDS Vest / Clip  Station Marker B  Ruler Value 38  Anatomical Comments low (LOW QUALITY READING)

## 2023-04-21 NOTE — Progress Notes (Addendum)
Patient Name: [name] Date of Encounter: 04/21/2023 [facility] Cardiologist: None    Interval Summary  .    Chest pain this am-chest pressure/tightness relieved with fentanyl, also had yesterday.  Vital Signs .    Vitals:   04/20/23 0503 04/20/23 1500 04/20/23 2012 04/21/23 0428  BP: 130/74 (!) 146/62 (!) 146/66 (!) 144/70  Pulse: 74 70 71 77  Resp: 20 18 18 20   Temp: 98.3 F (36.8 C)  98.4 F (36.9 C) 97.9 F (36.6 C)  TempSrc: Oral  Oral   SpO2: 95% 98% 96% 98%  Weight:      Height:        Intake/Output Summary (Last 24 hours) at 04/21/2023 0930 Last data filed at 04/20/2023 1738 Gross per 24 hour  Intake 914.53 ml  Output --  Net 914.53 ml      04/16/2023    1:44 PM 04/15/2023    9:44 PM 04/14/2023    8:01 PM  Last 3 Weights  Weight (lbs) 245 lb 245 lb 245 lb  Weight (kg) 111.131 kg 111.131 kg 111.131 kg      Telemetry/ECG    NSR - Personally Reviewed  Physical Exam .   GEN: No acute distress.   Neck: No JVD Cardiac: RRR 1/6 systolic murmur LSB, norubs, or gallops.  Respiratory: Clear to auscultation bilaterally. GI: Soft, nontender, non-distended  MS: No edema  Assessment & Plan .     Chest pain described as tightness relieved with fentanyl this am and yest. Currently on IV heparin with plans for cath once renal improves. Crt up 2.25 today and seen by renal. Also has contrast allergy so will need premedicated  CAD NSTEMI DES mRCA 04/2021 [place] on plavix, ASA, coreg and lipitor  PAF on coreg. Maintaining NSR. Eliquis on hold pending cath, on IV heparin  HLD LDL 120-lipitor increased 80 mg daily. Will need recheck as OP  DM2 A1C 9.1 per primary team  Right flank pain intermittent. CT nonobstructive right renal calculus  Anemia Hgb down 7.6 today? Related to renal. She's had history of anemia and has   For questions or updates, please contact  HeartCare Please consult [email or web address] for contact info under         Signed, Jacolyn Reedy, PA-C    Attending attestation  Patient seen and independently examined with Jacolyn Reedy, PA-C. We discussed all aspects of the encounter. I agree with the assessment and plan as stated above.   Briefly, patient is a 62 year old F known to have CAD manifested by NSTEMI in 2022 s/p RCA PCI in [place] (nonobstructive CAD in the remaining vessels) with normal LVEF, HTN, DM2, HLD, chronic anemia is currently admitted to hospitalist and further management of unstable angina on heparin drip. Hospital stays complicated by AKI versus worsening CKD on IV fluids. Nephrology consulted and discontinued IV fluids due to worsening serum creatinine.  Patient has ongoing waxing and waning of chest pain (similar to next pain when she had PCI placed in 2022). Physical exam showed HEENT normal, JVD unable to be examined due to body habitus, S1-S2 normal, no murmur, clear lungs, abdomen NTND, no edema. Currently has 8/10 chest pain but appears to be comfortable. EKG on admission showed sinus tachycardia, no ischemia. Will repeat EKG due to persistent chest pain. On heparin drip, continue DAPT and high intensity statin. Plan is to schedule for LHC once serum creatinine improves. However despite administering IV fluids, serum creatinine continued to increase and she  has new symptoms of DOE. Will obtain Reds vest. If elevated, will need to administer IV Lasix 60 mg one dose. Hemoglobin 7.7 today, will transfuse 1 unit PRBC to keep hemoglobin more than 8. No active bleeding, no melena or hematochezia. She will need outpatient GI evaluation.  Due to DM 2, CAD and morbid obesity, she will benefit from outpatient initiation of Ozempic.    Verne Spurr, MD [place]  [facility]  10:52 AM

## 2023-04-22 ENCOUNTER — Inpatient Hospital Stay (HOSPITAL_COMMUNITY): Payer: Medicaid - Out of State

## 2023-04-22 DIAGNOSIS — I5033 Acute on chronic diastolic (congestive) heart failure: Secondary | ICD-10-CM

## 2023-04-22 DIAGNOSIS — E118 Type 2 diabetes mellitus with unspecified complications: Secondary | ICD-10-CM | POA: Diagnosis not present

## 2023-04-22 DIAGNOSIS — R0789 Other chest pain: Secondary | ICD-10-CM | POA: Diagnosis not present

## 2023-04-22 DIAGNOSIS — N1832 Chronic kidney disease, stage 3b: Secondary | ICD-10-CM | POA: Diagnosis not present

## 2023-04-22 DIAGNOSIS — Z794 Long term (current) use of insulin: Secondary | ICD-10-CM

## 2023-04-22 DIAGNOSIS — I200 Unstable angina: Secondary | ICD-10-CM | POA: Diagnosis not present

## 2023-04-22 DIAGNOSIS — N179 Acute kidney failure, unspecified: Secondary | ICD-10-CM | POA: Diagnosis not present

## 2023-04-22 DIAGNOSIS — D649 Anemia, unspecified: Secondary | ICD-10-CM

## 2023-04-22 DIAGNOSIS — D509 Iron deficiency anemia, unspecified: Secondary | ICD-10-CM

## 2023-04-22 DIAGNOSIS — R079 Chest pain, unspecified: Secondary | ICD-10-CM | POA: Diagnosis not present

## 2023-04-22 LAB — ECHOCARDIOGRAM LIMITED
Height: 63 in
S' Lateral: 3 cm
Weight: 3920 oz

## 2023-04-22 LAB — GLUCOSE, CAPILLARY
Glucose-Capillary: 142 mg/dL — ABNORMAL HIGH (ref 70–99)
Glucose-Capillary: 130 mg/dL — ABNORMAL HIGH (ref 70–99)
Glucose-Capillary: 174 mg/dL — ABNORMAL HIGH (ref 70–99)
Glucose-Capillary: 169 mg/dL — ABNORMAL HIGH (ref 70–99)
Glucose-Capillary: 177 mg/dL — ABNORMAL HIGH (ref 70–99)

## 2023-04-22 LAB — TROPONIN I (HIGH SENSITIVITY): Troponin I (High Sensitivity): 4 ng/L (ref <18)

## 2023-04-22 LAB — HEPARIN LEVEL (UNFRACTIONATED): Heparin Unfractionated: 0.3 IU/mL (ref 0.30–0.70)

## 2023-04-22 MED ORDER — LACTATED RINGERS IV SOLN: INTRAVENOUS | Status: AC

## 2023-04-22 NOTE — Progress Notes (Signed)
   04/22/23 1100  ReDS Vest / Clip  Station Marker B  Ruler Value 38  ReDS Value Range 36 - 40  ReDS Actual Value 34

## 2023-04-22 NOTE — Progress Notes (Signed)
Mobility Specialist Progress Note:    04/22/23 0950  Mobility  Activity Transferred from bed to chair  Level of Assistance Moderate assist, patient does 50-74%  Assistive Device None  Distance Ambulated (ft) 4 ft  Range of Motion/Exercises Active;All extremities  Activity Response Tolerated well  Mobility Referral Yes  $Mobility charge 1 Mobility  Mobility Specialist Start Time (ACUTE ONLY) 0950  Mobility Specialist Stop Time (ACUTE ONLY) 1000  Mobility Specialist Time Calculation (min) (ACUTE ONLY) 10 min   Pt was received in bed, agreeable to mobility. Deferred ambulation d/t pt reporting discomfort. Required ModA to transfer B>C, no AD required. Tolerated well, pt had LOB d/t managing lines, required CGA. Left pt in chair, call bell in reach. All needs met.   Lawerance Bach Mobility Specialist Please contact via Special educational needs teacher or  Rehab office at [phone]

## 2023-04-22 NOTE — Progress Notes (Signed)
PROGRESS NOTE  [name]  UEA:[id number] DOB: 03-09-1961 DOA: 04/15/2023 PCP: Patient, No Pcp Per   Chief Complaint  Patient presents with   Chest Pain   Level of care: Telemetry  Brief Admission History:  62 y.o. female with medical history significant of CAD s/p PCI, hypertension, hyperlipidemia, T2DM, DVT on Eliquis who presents to the emergency department with complaints of chest pain.  Patient complained of left-sided chest pain which occurred yesterday while sitting in a car chest pain was described as sharp and nonreproducible and was rated as 8/10 on pain scale with radiation to the back, this was associated with dizziness and nausea without vomiting.  She states that she called her PCP who advised her to go to the ED for further evaluation and management.  Patient denies shortness of breath, diaphoresis, fever.    Assessment and Plan:  Chest pain with high risk for cardiac etiology/unstable angina - appreciate cardiology team consultation - continue supportive measures, IV heparin infusion and high dose statin  - working to improve creatinine in case cath is approved - cardio team planning to premedicate for cath given history of contrast allergy  AKI on CKD stage 3b  - creatinine slowly improving with hydration - continue IV fluids to maximally hydrate in case she requires cath tomorrow - recheck in AM  - renal US done 8/3 with normal findings reported  - urinalysis reassuring - appreciate nephrology consultation to assist with getting medically optimized for cath - CT stone study ordered by nephrology 8/6  Anemia in CKD  - Hg down to 7.7 with hydration - with her CAD, cardiology team want her Hg>8 - transfused 1 unit PRBC on 8/5 - Hg up to 9 after transfusion - follow   - hemoccult stools    RLQ abdominal pain - persistent, lipase test came back normal - pt reports appendectomy and cholecystectomy - CT abd/pelvis without contrast ordered (due to contrast  allergy and upcoming cath) - CT with findings of hepatomegaly and steatosis of liver   Chronic constipation  - pt reports having diabetic neuropathy and chronic problems with constipation  - pt has been on regular laxatives and has had a bowel movement in last 24 hours   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 - increased prandial novolog to 14 units TID with meals - continue home detemir 50 units BID   - monitor CBG 5 times per day CBG (last 3)  Recent Labs    04/22/23 0310 04/22/23 0732 04/22/23 1155  GLUCAP 142* 130* 174*   Chronic atrial fibrillation - carvedilol for heart rate control - holding apixaban in anticipation for possible cath later this week  - IV heparin for anticoagulation   Hyperlipidemia - atorvastatin 80 mg daily   Essential Hypertension  - resumed home carvedilol with good BP control   DVT prophylaxis: IV heparin  Code Status: full  Family Communication: bedside update Disposition: anticipate transfer to [facility] for cath when medically ready   Consultants:  cardiology Procedures:  Possible cardiac cath   Antimicrobials:    Subjective: Continues to have intermittent chest pain and pressure especially with ambulating to bathroom.     Objective: Vitals:   04/21/23 1941 04/22/23 0024 04/22/23 0353 04/22/23 0454  BP: 129/65 (!) 160/70 (!) 109/98 (!) 137/59  Pulse: 67 68  73  Resp:    17  Temp:    98.4 F (36.9 C)  TempSrc:    Oral  SpO2:    96%  Weight:  Height:        Intake/Output Summary (Last 24 hours) at 04/22/2023 1209 Last data filed at 04/22/2023 1055 Gross per 24 hour  Intake 1214.78 ml  Output 3550 ml  Net -2335.22 ml   Filed Weights   04/15/23 2144 04/16/23 1344  Weight: 111.1 kg 111.1 kg   Examination:  General exam: Appears calm and comfortable  Respiratory system: rales RLL, no increased work of breathing.  Cardiovascular system: normal S1 & S2 heard. No JVD, murmurs, rubs, gallops or clicks. No pedal  edema. Gastrointestinal system: Abdomen is nondistended, soft and persistent sharp RLQ abdominal pain. No organomegaly or masses felt. Normal bowel sounds heard. Central nervous system: Alert and oriented. No focal neurological deficits. Extremities: Symmetric 5 x 5 power. Skin: No rashes, lesions or ulcers. Psychiatry: Judgement and insight appear normal. Mood & affect appropriate.   Data Reviewed: I have personally reviewed following labs and imaging studies  CBC: Recent Labs  Lab 04/18/23 0438 04/19/23 0448 04/20/23 0455 04/21/23 0449 04/22/23 0432  WBC 3.8* 3.8* 4.2 3.9* 5.6  HGB 8.4* 8.2* 8.2* 7.6* 9.0*  HCT 27.3* 26.8* 27.1* 25.0* 28.1*  MCV 87.2 88.7 88.6 89.0 85.4  PLT 155 154 150 145* 144*    Basic Metabolic Panel: Recent Labs  Lab 04/16/23 0652 04/17/23 0636 04/18/23 0438 04/19/23 0448 04/20/23 0455 04/21/23 0449 04/22/23 0432  NA 135 134* 135 136 137 136 134*  K 4.0 3.9 4.1 4.4 4.3 4.3 4.5  CL 102 104 105 107 108 111 104  CO2 23 22 23  19* 23 21* 19*  GLUCOSE 210* 177* 201* 200* 147* 129* 156*  BUN 27* 22* 20 19 20 18 19   CREATININE 1.89* 1.85* 1.96* 2.07* 2.17* 2.25* 2.33*  CALCIUM 8.8* 7.6* 7.7* 8.0* 7.8* 7.7* 8.2*  MG 1.7 1.7  --   --   --  1.8  --   PHOS 4.8*  --   --   --   --   --  3.6    CBG: Recent Labs  Lab 04/21/23 1647 04/21/23 2116 04/22/23 0310 04/22/23 0732 04/22/23 1155  GLUCAP 183* 196* 142* 130* 174*    No results found for this or any previous visit (from the past 240 hour(s)).   Radiology Studies: DG CHEST PORT 1 VIEW  Result Date: 04/21/2023 CLINICAL DATA:  Chest pain EXAM: PORTABLE CHEST 1 VIEW COMPARISON:  04/20/2023 FINDINGS: The heart size and mediastinal contours are within normal limits. Both lungs are clear. The visualized skeletal structures are unremarkable. IMPRESSION: No active disease. Electronically Signed   By: Charlett Nose M.D.   On: 04/21/2023 16:58    Scheduled Meds:  aspirin EC  81 mg Oral Daily    atorvastatin  80 mg Oral q morning   carvedilol  12.5 mg Oral BID WC   clonazePAM  0.5 mg Oral TID   clopidogrel  75 mg Oral Daily   gabapentin  300 mg Oral QHS   insulin aspart  0-15 Units Subcutaneous TID WC   insulin aspart  14 Units Subcutaneous TID WC   insulin detemir  50 Units Subcutaneous BID   isosorbide mononitrate  30 mg Oral q morning   lamoTRIgine  100 mg Oral BID   multivitamin with minerals  1 tablet Oral Daily   polyethylene glycol  17 g Oral BID   senna-docusate  2 tablet Oral QHS   sertraline  100 mg Oral Daily   Continuous Infusions:  heparin 1,850 Units/hr (04/22/23 0840)   lactated  ringers 75 mL/hr at 04/22/23 0946     LOS: 5 days   Time spent: 35 mins   Laural Benes, MD How to contact the [facility] Attending or Consulting provider 7A - 7P or covering provider during after hours 7P -7A, for this patient?  Check the care team in [facility] and look for a) attending/consulting TRH provider listed and b) the [facility] team listed Log into [email or web address] and use [place] universal password to access. If you do not have the password, please contact the hospital operator. Locate the [facility] provider you are looking for under [facility] and page to a number that you can be directly reached. If you still have difficulty reaching the provider, please page the [facility] (Director on Call) for the Hospitalists listed on amion for assistance.  04/22/2023, 12:09 PM

## 2023-04-22 NOTE — Progress Notes (Signed)
Nutrition Follow-up  DOCUMENTATION CODES:   Morbid obesity  INTERVENTION:   -Continue Magic cup BID with meals, each supplement provides 290 kcal and 9 grams of protein  -Continue MVI with minerals daily -Educated pt on heart healthy/ carb modified diet and importance of self-management to help prevent complications; Provided pt with "Heart Healthy, Consistent Carbohydrate Nutrition Therapy" handout from [facility] Nutrition Care Manual; attached to AVS/ discharge summary -RD referred pt to [facility] for further support and reinforcement at discharge   NUTRITION DIAGNOSIS:   Increased nutrient needs related to acute illness as evidenced by estimated needs.  Ongoing  GOAL:   Patient will meet greater than or equal to 90% of their needs  Progressing   MONITOR:   PO intake, Supplement acceptance  REASON FOR ASSESSMENT:   Malnutrition Screening Tool    ASSESSMENT:   Pt with medical history significant of CAD s/p PCI, hypertension, hyperlipidemia, T2DM, DVT on Eliquis who presents with complaints of chest pain.  Reviewed I/O's: -2.5 L x 24 hours and +6.2 L since admission  UOP: 3.6 L x 24 hours   Per MD notes, plan to continue to improve renal status for cardiac cath.   Spoke with pt, who was sitting in recliner chair at time of visit. Pt complains of not feeling well today. Pt reports fair appetite, consumed 75% of breakfast. Noted meal completions 50-75%. Pt reports she has been eating most of her meals during hospitalization. Pt shares "I don't think I'm eating right at home". RD allowed pt to explore this statement; she reports she usually only consumes 1 meal per day (such as a sandwich). Pt reports she will sometimes snacks throughout the day, but admits to drinking a 2 L Coke daily. Pt also likes ot drink unsweetened tea with Splenda.   Pt shares that her DM is uncontrolled and blood sugars are high (usually in the 400-500's at  home). Pt denies any difficulty obtaining her medications currently and has an adequate supply at home, however, shares her fear of potentially not renewing her Medicaid and being unable to access her medications. Pt shares that heart disease and DM run in her family and family members have suffered complications such as amputations and pt is distressed and fear that this may also happen to her. RD had a long discussion with pt regarding pathophysiology of DM, risk for complications, and DM self-management. Pt has neuropathy and takes gabapentin for this. RD reviewed importance of daily foot care to help manage neuropathy as well as prevent amputation risk. Discussed importance of decreasing sugar beverages; pt able to identify low calorie drink alternatives which she liked and reported willingness to try in order to improve glycemic control.   Pt reports that her weight "always goes up and down", but unable to provide range. Reviewed wt hx; wt has been stable over the past 6 months. Noted pt with moderate edema, which may be masking true weight loss as well as fat and muscle depletions.   Discussed importance of good meal intake to promote healing. Pt had no further questions, but appreciative of visit.   Medications reviewed and include senokot and miralax.   Labs reviewed: CBGS: 130-174 (inpatient orders for glycemic control are 0-15 units insulin aspart TID with meals, 14 units insulin aspart TID with meal,s and 50 units insulin detemir BID).    NUTRITION - FOCUSED PHYSICAL EXAM:  Flowsheet Row Most Recent Value  Orbital Region No depletion  Upper Arm Region  Mild depletion  Thoracic and Lumbar Region No depletion  Buccal Region No depletion  Clavicle Bone Region No depletion  Clavicle and Acromion Bone Region No depletion  Scapular Bone Region No depletion  Dorsal Hand No depletion  Patellar Region No depletion  Anterior Thigh Region No depletion  Posterior Calf Region Mild depletion   Edema (RD Assessment) Mild  Hair Reviewed  Eyes Reviewed  Mouth Reviewed  Skin Reviewed  Nails Reviewed       Diet Order:   Diet Order             Diet heart healthy/carb modified Room service appropriate? Yes; Fluid consistency: Thin  Diet effective now                   EDUCATION NEEDS:   Education needs have been addressed  Skin:  Skin Assessment: Reviewed RN Assessment  Last BM:  04/19/23  Height:   Ht Readings from Last 1 Encounters:  04/16/23 5\' 3"  (1.6 m)    Weight:   Wt Readings from Last 1 Encounters:  04/16/23 111.1 kg    Ideal Body Weight:  52.3 kg  BMI:  Body mass index is 43.4 kg/m.  Estimated Nutritional Needs:   Kcal:  1550-1750  Protein:  80-95 grams  Fluid:  > 1.5 L    Levada Schilling, RD, LDN, CDCES Registered Dietitian II Certified Diabetes Care and Education Specialist Please refer to [facility] for RD and/or RD on-call/weekend/after hours pager

## 2023-04-22 NOTE — Progress Notes (Signed)
ANTICOAGULATION CONSULT NOTE   Pharmacy Consult for Heparin Indication: chest pain/ACS   Patient Measurements: Height: 5\' 3"  (160 cm) Weight: 111.1 kg (245 lb) IBW/kg (Calculated) : 52.4 HEPARIN DW (KG): 79.2   Vital Signs: Temp: 97.5 F (36.4 C) (08/06 1513) Temp Source: Oral (08/06 1513) BP: 117/62 (08/06 1513) Pulse Rate: 65 (08/06 1513)  Labs: Recent Labs    04/20/23 0455 04/21/23 0449 04/22/23 0432 04/22/23 1502  HGB 8.2* 7.6* 9.0*  --   HCT 27.1* 25.0* 28.1*  --   PLT 150 145* 144*  --   HEPARINUNFRC 0.30 0.24* 0.24* 0.30  CREATININE 2.17* 2.25* 2.33*  --   TROPONINIHS  --   --  4  --     Estimated Creatinine Clearance: 30.8 mL/min (A) (by C-G formula based on SCr of 2.33 mg/dL (H)).   Medical History: Past Medical History:  Diagnosis Date   Asthma    Atrial fibrillation (HCC)    Bipolar 1 disorder (HCC)    Chronic back pain    Chronic chest pain    Diabetes mellitus without complication (HCC)    Gastroparesis    GERD (gastroesophageal reflux disease)    Hyperlipemia    Hypertension    IBS (irritable bowel syndrome)    Normal cardiac stress test 02/2014   [facility]   Assessment: Patient present with chest pain with atypical features, Dyspnea on exertion and diaphoresis. 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 Cardiology plans to do cath and is bridging with heparin.  Heparin level therapeutic at low end of range 0.30 Hgb 9.0  Goal of Therapy:  Heparin level 0.3-0.7 units/ml Monitor platelets by anticoagulation protocol: Yes   Plan:  Increase heparin infusion to 1950/hr Check anti-Xa level in 6-8 hours and daily Continue to monitor H&H and platelets  Judeth Cornfield, PharmD Clinical Pharmacist 04/22/2023 4:48 PM

## 2023-04-22 NOTE — Progress Notes (Signed)
ANTICOAGULATION CONSULT NOTE   Pharmacy Consult for Heparin Indication: chest pain/ACS   Patient Measurements: Height: 5\' 3"  (160 cm) Weight: 111.1 kg (245 lb) IBW/kg (Calculated) : 52.4 HEPARIN DW (KG): 79.2   Vital Signs: Temp: 98.4 F (36.9 C) (08/06 0454) Temp Source: Oral (08/06 0454) BP: 137/59 (08/06 0454) Pulse Rate: 73 (08/06 0454)  Labs: Recent Labs    04/20/23 0455 04/21/23 0449 04/22/23 0432  HGB 8.2* 7.6* 9.0*  HCT 27.1* 25.0* 28.1*  PLT 150 145* 144*  HEPARINUNFRC 0.30 0.24* 0.24*  CREATININE 2.17* 2.25* 2.33*  TROPONINIHS  --   --  4    Estimated Creatinine Clearance: 30.8 mL/min (A) (by C-G formula based on SCr of 2.33 mg/dL (H)).   Medical History: Past Medical History:  Diagnosis Date   Asthma    Atrial fibrillation (HCC)    Bipolar 1 disorder (HCC)    Chronic back pain    Chronic chest pain    Diabetes mellitus without complication (HCC)    Gastroparesis    GERD (gastroesophageal reflux disease)    Hyperlipemia    Hypertension    IBS (irritable bowel syndrome)    Normal cardiac stress test 02/2014   [facility]   Assessment: Patient present with chest pain with atypical features, Dyspnea on exertion and diaphoresis. 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 Cardiology plans to do cath and is bridging with heparin.  Heparin level slightly subtherapeutic at 0.24 Hgb 9.0  Goal of Therapy:  Heparin level 0.3-0.7 units/ml Monitor platelets by anticoagulation protocol: Yes   Plan:  Increase heparin infusion to 1850/hr Check anti-Xa level in 6-8 hours and daily Continue to monitor H&H and platelets  Judeth Cornfield, PharmD Clinical Pharmacist 04/22/2023 7:43 AM

## 2023-04-22 NOTE — Plan of Care (Signed)
  Problem: Education: Goal: Ability to describe self-care measures that may prevent or decrease complications (Diabetes Survival Skills Education) will improve Outcome: Progressing Goal: Individualized Educational Video(s) Outcome: Progressing   

## 2023-04-22 NOTE — Progress Notes (Signed)
[facility] Progress Note    Assessment/ Plan:   AKI on CKD3 -Baseline creatinine typically around 1.1-1.4. It is possible that her normal saline is causing some worsening AKI along with worsening anemia initially. Stopped NS on 8/5 given concern for AKI being induced by this. Cr up to 2.3 today, likely due to lasix yesterday. Based on exam, CXR, and discussing with cardiology-she is likely not volume overloaded, will trial LR 75cc x 1 day and reassess -will obtain CT renal stone -hopefully can undergo cardiac cath once Cr improves/stabilizes. May very well need LR around/during contrast exposure to prevent CIN -Avoid nephrotoxic medications including NSAIDs and iodinated intravenous contrast exposure unless the latter is absolutely indicated.  Preferred narcotic agents for pain control are hydromorphone, fentanyl, and methadone. Morphine should not be used. Avoid Baclofen and avoid oral sodium phosphate and magnesium citrate based laxatives / bowel preps. Continue strict Input and Output monitoring. Will monitor the patient closely with you and intervene or adjust therapy as indicated by changes in clinical status/labs    Chest pain -high risk for cardiac etiology. Cardiology following, currently on DAPT and heparin gtt. Pending cath provided Cr improves   HTN -BP reasonably controlled   Anemia -Transfuse for Hgb<7 g/dL. Had been downtrending on DAPT and heparin gtt -s/p 1u prbc 8/5, hgb up to 9 today. If downtrending again, would recommend checking at least a FOBT, will defer to primary service   Uncontrolled Diabetes Mellitus Type 2 with Hyperglycemia -per primary   Abdominal pain -repeat UA remained bland, will check a CT renal stone for completion's sake  Discussed with primary service and cardiology.  Anthony Sar, MD [facility]  Subjective:   Patient seen and examined bedside. Still with intermittent chest pain and abd/flank pain on the right  (unchanged). Does report SOB (ongoing), CXR done yesterday which was unremarkable (personally reviewed). Received lasix 60mg  iv x 1 dose and 1u prbc yesterday   Objective:   BP (!) 137/59 (BP Location: Left Arm)   Pulse 73   Temp 98.4 F (36.9 C) (Oral)   Resp 17   Ht 5\' 3"  (1.6 m)   Wt 111.1 kg   SpO2 96%   BMI 43.40 kg/m   Intake/Output Summary (Last 24 hours) at 04/22/2023 8295 Last data filed at 04/22/2023 0340 Gross per 24 hour  Intake 1094.78 ml  Output 3550 ml  Net -2455.22 ml   Weight change:   Physical Exam: Gen: NAD CVS: RRR Resp: CTA B/L, normal WOB Abd: TTP RLQ, soft, ND Ext: no edema b/l Les Neuro: awake, alert  Imaging: DG CHEST PORT 1 VIEW  Result Date: 04/21/2023 CLINICAL DATA:  Chest pain EXAM: PORTABLE CHEST 1 VIEW COMPARISON:  04/20/2023 FINDINGS: The heart size and mediastinal contours are within normal limits. Both lungs are clear. The visualized skeletal structures are unremarkable. IMPRESSION: No active disease. Electronically Signed   By: Charlett Nose M.D.   On: 04/21/2023 16:58   DG CHEST PORT 1 VIEW  Result Date: 04/20/2023 CLINICAL DATA:  [id number] Chest pain [id number] EXAM: PORTABLE CHEST - 1 VIEW COMPARISON:  04/15/2023 FINDINGS: Relatively low lung volumes with some crowding of bronchovascular structures centrally. No new infiltrate. No pneumothorax. Heart size and mediastinal contours are within normal limits. No effusion. Visualized bones unremarkable. IMPRESSION: Low lung volumes. No acute findings. Electronically Signed   By: Corlis Leak M.D.   On: 04/20/2023 10:03    Labs: BMET Recent Labs  Lab 04/16/23 6578 04/17/23 0636 04/18/23  4696 04/19/23 0448 04/20/23 0455 04/21/23 0449 04/22/23 0432  NA 135 134* 135 136 137 136 134*  K 4.0 3.9 4.1 4.4 4.3 4.3 4.5  CL 102 104 105 107 108 111 104  CO2 23 22 23  19* 23 21* 19*  GLUCOSE 210* 177* 201* 200* 147* 129* 156*  BUN 27* 22* 20 19 20 18 19   CREATININE 1.89* 1.85* 1.96* 2.07* 2.17* 2.25* 2.33*   CALCIUM 8.8* 7.6* 7.7* 8.0* 7.8* 7.7* 8.2*  PHOS 4.8*  --   --   --   --   --  3.6   CBC Recent Labs  Lab 04/19/23 0448 04/20/23 0455 04/21/23 0449 04/22/23 0432  WBC 3.8* 4.2 3.9* 5.6  HGB 8.2* 8.2* 7.6* 9.0*  HCT 26.8* 27.1* 25.0* 28.1*  MCV 88.7 88.6 89.0 85.4  PLT 154 150 145* 144*    Medications:     aspirin EC  81 mg Oral Daily   atorvastatin  80 mg Oral q morning   carvedilol  12.5 mg Oral BID WC   clonazePAM  0.5 mg Oral TID   clopidogrel  75 mg Oral Daily   gabapentin  300 mg Oral QHS   insulin aspart  0-15 Units Subcutaneous TID WC   insulin aspart  14 Units Subcutaneous TID WC   insulin detemir  50 Units Subcutaneous BID   isosorbide mononitrate  30 mg Oral q morning   lamoTRIgine  100 mg Oral BID   multivitamin with minerals  1 tablet Oral Daily   polyethylene glycol  17 g Oral BID   senna-docusate  2 tablet Oral QHS   sertraline  100 mg Oral Daily      Anthony Sar, MD [facility] 04/22/2023, 8:07 AM

## 2023-04-22 NOTE — Progress Notes (Signed)
*  PRELIMINARY RESULTS* Echocardiogram A Limited 2D Echocardiogram has been performed.  [name] 04/22/2023, 4:57 PM

## 2023-04-22 NOTE — Progress Notes (Addendum)
Patient Name: [name] Date of Encounter: 04/22/2023 [facility] Cardiologist: Marjo Bicker, MD    Interval Summary  .    Got up at 2 am to BR and became SOB with chest tightness relieved with pain meds.Urinated a lot with lasix  Vital Signs .    Vitals:   04/21/23 1941 04/22/23 0024 04/22/23 0353 04/22/23 0454  BP: 129/65 (!) 160/70 (!) 109/98 (!) 137/59  Pulse: 67 68  73  Resp:    17  Temp:    98.4 F (36.9 C)  TempSrc:    Oral  SpO2:    96%  Weight:      Height:        Intake/Output Summary (Last 24 hours) at 04/22/2023 0755 Last data filed at 04/22/2023 0340 Gross per 24 hour  Intake 1094.78 ml  Output 3550 ml  Net -2455.22 ml      04/16/2023    1:44 PM 04/15/2023    9:44 PM 04/14/2023    8:01 PM  Last 3 Weights  Weight (lbs) 245 lb 245 lb 245 lb  Weight (kg) 111.131 kg 111.131 kg 111.131 kg      Telemetry/ECG    NSR - Personally Reviewed  Physical Exam .   GEN: No acute distress.   Neck: No JVD Cardiac:  RRR, no murmurs, rubs, or gallops.  Respiratory: Clear to auscultation bilaterally. GI: Soft, nontender, non-distended  MS: No edema  Assessment & Plan .    Chest pain described as tightness relieved with fentanyl Currently on IV heparin with plans for cath once renal improves. Crt up 2.33 today and seen by renal. Also has contrast allergy so will need premedicated. ReDS vest low quality reading 38. Lasix 60 mg IV x 1 given yest likely contributed to bump Crt. Renal to manage. No cath today.   CAD NSTEMI DES mRCA 04/2021 in [place] on plavix, ASA, coreg and lipitor   PAF on coreg. Maintaining NSR. Eliquis on hold pending cath, on IV heparin   HLD LDL 120-lipitor increased 80 mg daily. Will need recheck as OP   DM2 A1C 9.1 per primary team   Right flank pain intermittent. CT nonobstructive right renal calculus   Anemia Hgb down 7.6 yest and transfused 1 unit PRBC's . Hgb 9.0 today.  AKI Crt up 2.33-renal following.     For  questions or updates, please contact [facility] Please consult [email or web address] for contact info under        Signed, Jacolyn Reedy, PA-C     Attending attestation  No acute events overnight except for waxing and waning of chest pain.  Received 1 unit of PRBC yesterday, IV Lasix 60 mg x 1 with 3.5L urine output and net -2.4 L. Patient continues to have DOE, serum creatinine mildly increased from 2.2 yesterday to 2.3 this morning. BNP mildly elevated 297 this a.m but chest x-ray was clear yesterday. Physical exam showed HEENT normal, JVD unable to examine due to body habitus, S1-S2 normal, bibasilar Rales present, no wheezing, abdomen NTND, no edema in lower extremities. For unstable angina, continue ACS protocol with heparin, aspirin and high intensity statin.  She continues to have waxing and waning of chest pain, this could be atypical to, will obtain high sensitive troponins and EKG.  For chronic diastolic heart failure, patient was having symptoms of DOE yesterday, received 1 unit of PRBC for hemoglobin less than 8, with IV Lasix 60 mg x 1 and had 3.5L urine output.  She continues to have DOE symptoms today, will obtain limited echo for IVC check.  Nephrology on board, recommended Ringer's lactate for improvement of AKI. Once serum creatinine starts to downtrend, she will be transferred to [facility] for LHC.   Verne Spurr, MD [place]  [facility]  10:22 AM

## 2023-04-23 DIAGNOSIS — N1832 Chronic kidney disease, stage 3b: Secondary | ICD-10-CM | POA: Diagnosis not present

## 2023-04-23 DIAGNOSIS — I5032 Chronic diastolic (congestive) heart failure: Secondary | ICD-10-CM

## 2023-04-23 DIAGNOSIS — R0789 Other chest pain: Secondary | ICD-10-CM | POA: Diagnosis not present

## 2023-04-23 DIAGNOSIS — R079 Chest pain, unspecified: Secondary | ICD-10-CM | POA: Diagnosis not present

## 2023-04-23 DIAGNOSIS — I200 Unstable angina: Secondary | ICD-10-CM | POA: Diagnosis not present

## 2023-04-23 DIAGNOSIS — D649 Anemia, unspecified: Secondary | ICD-10-CM | POA: Diagnosis not present

## 2023-04-23 DIAGNOSIS — E118 Type 2 diabetes mellitus with unspecified complications: Secondary | ICD-10-CM | POA: Diagnosis not present

## 2023-04-23 LAB — GLUCOSE, CAPILLARY
Glucose-Capillary: 138 mg/dL — ABNORMAL HIGH (ref 70–99)
Glucose-Capillary: 125 mg/dL — ABNORMAL HIGH (ref 70–99)
Glucose-Capillary: 207 mg/dL — ABNORMAL HIGH (ref 70–99)
Glucose-Capillary: 175 mg/dL — ABNORMAL HIGH (ref 70–99)
Glucose-Capillary: 185 mg/dL — ABNORMAL HIGH (ref 70–99)

## 2023-04-23 LAB — CK: Total CK: 68 U/L (ref 38–234)

## 2023-04-23 LAB — SARS CORONAVIRUS 2 BY RT PCR: SARS Coronavirus 2 by RT PCR: NEGATIVE

## 2023-04-23 NOTE — Progress Notes (Signed)
ANTICOAGULATION CONSULT NOTE   Pharmacy Consult for Heparin Indication: chest pain/ACS   Patient Measurements: Height: 5\' 3"  (160 cm) Weight: 111.1 kg (245 lb) IBW/kg (Calculated) : 52.4 HEPARIN DW (KG): 79.2   Vital Signs: Temp: 98.7 F (37.1 C) (08/07 0443) Temp Source: Oral (08/07 0443) BP: 156/74 (08/07 0443) Pulse Rate: 69 (08/07 0443)  Labs: Recent Labs    04/21/23 0449 04/22/23 0432 04/22/23 1502 04/23/23 0439  HGB 7.6* 9.0*  --  8.4*  HCT 25.0* 28.1*  --  27.4*  PLT 145* 144*  --  139*  HEPARINUNFRC 0.24* 0.24* 0.30 0.35  CREATININE 2.25* 2.33*  --  2.53*  TROPONINIHS  --  4  --   --     Estimated Creatinine Clearance: 28.3 mL/min (A) (by C-G formula based on SCr of 2.53 mg/dL (H)).   Medical History: Past Medical History:  Diagnosis Date   Asthma    Atrial fibrillation (HCC)    Bipolar 1 disorder (HCC)    Chronic back pain    Chronic chest pain    Diabetes mellitus without complication (HCC)    Gastroparesis    GERD (gastroesophageal reflux disease)    Hyperlipemia    Hypertension    IBS (irritable bowel syndrome)    Normal cardiac stress test 02/2014   [facility]   Assessment: Patient present with chest pain with atypical features, Dyspnea on exertion and diaphoresis. 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 Cardiology plans to do cath and is bridging with heparin.  Heparin level therapeutic at 0.35 Hgb 8.4  Goal of Therapy:  Heparin level 0.3-0.7 units/ml Monitor platelets by anticoagulation protocol: Yes   Plan:  Continue heparin infusion at 1950/hr Check anti-Xa level daily Continue to monitor H&H and platelets  Judeth Cornfield, PharmD Clinical Pharmacist 04/23/2023 7:38 AM

## 2023-04-23 NOTE — Progress Notes (Addendum)
Progress Note  Patient Name: [name] Date of Encounter: 04/23/2023  Primary Cardiologist: Marjo Bicker, MD  Subjective   No acute events overnight, continues to have DOE and chest pain.  Troponins from yesterday were within normal limits and EKG showed no ischemia.  Inpatient Medications    Scheduled Meds:  aspirin EC  81 mg Oral Daily   atorvastatin  80 mg Oral q morning   carvedilol  12.5 mg Oral BID WC   clonazePAM  0.5 mg Oral TID   clopidogrel  75 mg Oral Daily   gabapentin  300 mg Oral QHS   insulin aspart  0-15 Units Subcutaneous TID WC   insulin aspart  14 Units Subcutaneous TID WC   insulin detemir  50 Units Subcutaneous BID   isosorbide mononitrate  30 mg Oral q morning   lamoTRIgine  100 mg Oral BID   multivitamin with minerals  1 tablet Oral Daily   polyethylene glycol  17 g Oral BID   senna-docusate  2 tablet Oral QHS   sertraline  100 mg Oral Daily   Continuous Infusions:  heparin 1,950 Units/hr (04/22/23 2213)   lactated ringers 75 mL/hr at 04/23/23 0017   PRN Meds: acetaminophen **OR** acetaminophen, bisacodyl, diphenhydrAMINE, fentaNYL (SUBLIMAZE) injection, ondansetron **OR** ondansetron (ZOFRAN) IV, mouth rinse, oxyCODONE, prochlorperazine   Vital Signs    Vitals:   04/22/23 2234 04/23/23 0110 04/23/23 0246 04/23/23 0443  BP: (!) 142/64 (!) 140/68 (!) 142/56 (!) 156/74  Pulse: 63 70 64 69  Resp:   20   Temp:   98.1 F (36.7 C) 98.7 F (37.1 C)  TempSrc:   Oral Oral  SpO2:   96% 96%  Weight:      Height:        Intake/Output Summary (Last 24 hours) at 04/23/2023 0925 Last data filed at 04/23/2023 0551 Gross per 24 hour  Intake 1899.14 ml  Output 2000 ml  Net -100.86 ml   Filed Weights   04/15/23 2144 04/16/23 1344  Weight: 111.1 kg 111.1 kg    Telemetry     Personally reviewed, NSR.  ECG    Not performed today  Physical Exam   GEN: No acute distress.   Neck: No JVD. Cardiac: RRR, no murmur, rub, or gallop.   Respiratory: Nonlabored. Clear to auscultation bilaterally. GI: Soft, nontender, bowel sounds present. MS: No edema; No deformity. Neuro:  Nonfocal. Psych: Alert and oriented x 3. Normal affect.  Labs    Chemistry Recent Labs  Lab 04/21/23 0449 04/22/23 0432 04/23/23 0439  NA 136 134* 136  K 4.3 4.5 4.3  CL 111 104 103  CO2 21* 19* 25  GLUCOSE 129* 156* 138*  BUN 18 19 21*  CREATININE 2.25* 2.33* 2.53*  CALCIUM 7.7* 8.2* 8.3*  ALBUMIN  --  3.0* 2.8*  GFRNONAA 24* 23* 21*  ANIONGAP 4* 11 8     Hematology Recent Labs  Lab 04/21/23 0449 04/22/23 0432 04/23/23 0439  WBC 3.9* 5.6 3.9*  RBC 2.81* 3.29* 3.12*  HGB 7.6* 9.0* 8.4*  HCT 25.0* 28.1* 27.4*  MCV 89.0 85.4 87.8  MCH 27.0 27.4 26.9  MCHC 30.4 32.0 30.7  RDW 17.1* 18.1* 18.3*  PLT 145* 144* 139*    Cardiac Enzymes Recent Labs  Lab 03/30/23 1757 03/30/23 1934 04/15/23 2158 04/15/23 2342 04/22/23 0432  TROPONINIHS 4 5 5 6 4     BNP Recent Labs  Lab 04/21/23 1256 04/22/23 0432  BNP 183.0* 297.0*  DDimerNo results for input(s): "DDIMER" in the last 168 hours.   Radiology    CT RENAL STONE STUDY  Result Date: 04/22/2023 CLINICAL DATA:  Left flank pain, nausea EXAM: CT ABDOMEN AND PELVIS WITHOUT CONTRAST TECHNIQUE: Multidetector CT imaging of the abdomen and pelvis was performed following the standard protocol without IV contrast. RADIATION DOSE REDUCTION: This exam was performed according to the departmental dose-optimization program which includes automated exposure control, adjustment of the mA and/or kV according to patient size and/or use of iterative reconstruction technique. COMPARISON:  04/18/2023 FINDINGS: Lower chest: Interval development of bibasilar peribronchovascular ground-glass pulmonary infiltrates likely related to multifocal infection or noncardiogenic pulmonary edema. Trace bilateral pleural effusions. Extensive multi-vessel coronary artery calcification. Global cardiac size  within normal limits. Hepatobiliary: No focal liver abnormality is seen. Status post cholecystectomy. No biliary dilatation. Pancreas: Unremarkable. No pancreatic ductal dilatation or surrounding inflammatory changes. Spleen: Mild splenomegaly is stable with the spleen measuring 15.6 cm in greatest dimension. This appears slightly progressive since remote prior examination 10/07/2022. No intrasplenic lesions identified on this noncontrast examination. Adrenals/Urinary Tract: The adrenal glands are unremarkable. The kidneys are normal in size and position. 1-2 mm punctate nonobstructing calculi are noted within the interpolar and lower polar regions of the kidneys bilaterally. No hydronephrosis. Minimal nonspecific bilateral perinephric stranding. No perinephric fluid collections. No hydronephrosis. No ureteral calculi. The bladder is unremarkable. Stomach/Bowel: Moderate colonic stool burden without evidence of obstruction. Status post subtotal appendectomy. The stomach, small bowel, and large bowel are otherwise unremarkable. No evidence of obstruction or focal inflammation. No free intraperitoneal gas or fluid. Vascular/Lymphatic: Aortic atherosclerosis. No enlarged abdominal or pelvic lymph nodes. Reproductive: Status post hysterectomy. No adnexal masses. Other: No abdominal wall hernia. Musculoskeletal: Osseous structures are age-appropriate. No acute bone abnormality. No lytic or blastic bone lesion. IMPRESSION: 1. Interval development of bibasilar peribronchovascular ground-glass pulmonary infiltrates likely related to multifocal infection or noncardiogenic pulmonary edema. Trace bilateral pleural effusions. 2. Extensive multi-vessel coronary artery calcification. 3. Mild splenomegaly, progressive since remote prior examination of 10/07/2022. 4. Minimal bilateral nonobstructing nephrolithiasis. No urolithiasis. No hydronephrosis. 5. Moderate colonic stool burden without evidence of obstruction. 6. Aortic  atherosclerosis. Aortic Atherosclerosis (ICD10-I70.0). Electronically Signed   By: Helyn Numbers M.D.   On: 04/22/2023 19:45   ECHOCARDIOGRAM LIMITED  Result Date: 04/22/2023    ECHOCARDIOGRAM LIMITED REPORT   Patient Name:   KHARLI TROUNG Hakimi Date of Exam: 04/22/2023 Medical Rec #:  [id number]      Height:       63.0 in Accession #:    [id number]     Weight:       245.0 lb Date of Birth:  10/01/1960      BSA:          2.108 m Patient Age:    61 years       BP:           117/62 mmHg Patient Gender: F              HR:           67 bpm. Exam Location:  [facility] Procedure: Limited Echo, Cardiac Doppler and Color Doppler Indications:    CHF                 IVC check  History:        Patient has prior history of Echocardiogram examinations, most                 recent 04/17/2023.  CHF, CAD and Angina, Arrythmias:Atrial                 Fibrillation, Signs/Symptoms:Chest Pain; Risk                 Factors:Hypertension, Diabetes and Dyslipidemia. CKD.  Sonographer:    Mikki Harbor Referring Phys: [id number]  P   Sonographer Comments: Patient is obese. IMPRESSIONS  1. Limited Echo for IVC check.  2. Left ventricular ejection fraction, by estimation, is 60 to 65%. The left ventricle has normal function. Left ventricular diastolic function could not be evaluated.  3. Right ventricular systolic function is normal. The right ventricular size is normal. There is normal pulmonary artery systolic pressure.  4. The inferior vena cava is normal in size with <50% respiratory variability, suggesting right atrial pressure of 8 mmHg. FINDINGS  Left Ventricle: Left ventricular ejection fraction, by estimation, is 60 to 65%. The left ventricle has normal function. The left ventricular internal cavity size was normal in size. There is no left ventricular hypertrophy. Left ventricular diastolic function could not be evaluated. Right Ventricle: The right ventricular size is normal. Right ventricular systolic function is normal.  There is normal pulmonary artery systolic pressure. The tricuspid regurgitant velocity is 2.52 m/s, and with an assumed right atrial pressure of 8 mmHg,  the estimated right ventricular systolic pressure is 33.4 mmHg. Left Atrium: Left atrial size was not assessed. Right Atrium: Right atrial size was not assessed. Pericardium: There is no evidence of pericardial effusion. Mitral Valve: The mitral valve is normal in structure. Tricuspid Valve: The tricuspid valve is normal in structure. Tricuspid valve regurgitation is trivial. Aortic Valve: The aortic valve is tricuspid. There is mild calcification of the aortic valve. There is mild thickening of the aortic valve. There is mild aortic valve annular calcification. Pulmonic Valve: The pulmonic valve was normal in structure. Aorta: The aortic root is normal in size and structure. Venous: The inferior vena cava is normal in size with less than 50% respiratory variability, suggesting right atrial pressure of 8 mmHg. IAS/Shunts: The interatrial septum was not assessed. LEFT VENTRICLE PLAX 2D LVIDd:         4.70 cm LVIDs:         3.00 cm LV PW:         1.20 cm LV IVS:        1.10 cm LVOT diam:     2.10 cm LVOT Area:     3.46 cm  LEFT ATRIUM         Index LA diam:    4.40 cm 2.09 cm/m   AORTA Ao Root diam: 3.10 cm TRICUSPID VALVE TR Peak grad:   25.4 mmHg TR Vmax:        252.00 cm/s  SHUNTS Systemic Diam: 2.10 cm  Priya  Electronically signed by Winfield Rast  Signature Date/Time: 04/22/2023/5:07:38 PM    Final    DG CHEST PORT 1 VIEW  Result Date: 04/21/2023 CLINICAL DATA:  Chest pain EXAM: PORTABLE CHEST 1 VIEW COMPARISON:  04/20/2023 FINDINGS: The heart size and mediastinal contours are within normal limits. Both lungs are clear. The visualized skeletal structures are unremarkable. IMPRESSION: No active disease. Electronically Signed   By: Charlett Nose M.D.   On: 04/21/2023 16:58     Assessment & Plan   # Unstable angina # CAD manifested  by NSTEMI s/p RCA PCI in 2022 in [place] # CKD stage IIIb -Currently on ACS protocol, maintenance doses of aspirin, Plavix, high intensity  statin and heparin drip. Serum creatinine uptrending since admission, nephrology on board. On IV fluids currently. Defer AKI management to nephrology. Serum creatinine starts to improve, she will be transferred to [facility] for LHC.  Echo from this admission showed normal LVEF, G1 DD, normal RV function, no valvular heart disease.  # Chronic anemia -Received 1 unit PRBC this admission.  Keep hemoglobin more than 8.    Signed, Marjo Bicker, MD  04/23/2023, 9:25 AM

## 2023-04-23 NOTE — Progress Notes (Signed)
PROGRESS NOTE  Tayjah Orbach ZOX:[id number] DOB: 02/19/1961 DOA: 04/15/2023 PCP: Patient, No Pcp Per  Brief History:  62 y.o. female with medical history significant of CAD s/p PCI, hypertension, hyperlipidemia, T2DM, DVT on Eliquis who presents to the emergency department with complaints of chest pain.  Patient complained of left-sided chest pain which occurred yesterday while sitting in a car chest pain was described as sharp and nonreproducible and was rated as 8/10 on pain scale with radiation to the back, this was associated with dizziness and nausea without vomiting.  She states that she called her PCP who advised her to go to the ED for further evaluation and management.  Patient denies shortness of breath, diaphoresis, fever.    Assessment/Plan:  Chest pain with high risk for cardiac etiology/unstable angina - appreciate cardiology team consultation - continue supportive measures, IV heparin infusion and high dose statin  - working to improve creatinine in case cath is approved - cardio team planning to premedicate for cath given history of contrast allergy -has some atypical features--requiring fentanyl IV pushes for pain   AKI on CKD stage 3b  -Baseline creatinine 1.2-1.4 -Serum creatinine peaking 2.53 - continue IV fluids per renal - recheck renal panel - renal US done 8/3 with normal findings reported  - urinalysis reassuring - appreciate nephrology consultation to assist with getting medically optimized for cath - CT stone study--negative hydronephrosis, bibasilar GGO--infx vs edema; trace bilateral pleural effusion, moderate stool burden -8/7--discussed with Dr. Thedore Mins  Pulmonary infiltrates -viral respiratory panel -check COVID -check PCT   Anemia in CKD  - Hg down to 7.7 with hydration - with her CAD, cardiology team want her Hg>8 - transfused 1 unit PRBC on 8/5 - Hg up to 9 after transfusion - follow   - hemoccult stools     RLQ abdominal pain -  persistent, lipase test came back normal - pt reports appendectomy and cholecystectomy - CT abd/pelvis without contrast ordered (due to contrast allergy and upcoming cath) - CT with findings of hepatomegaly and steatosis of liver  - 8/6 CT renal protocol--negative hydronephrosis, bibasilar GGO--infx vs edema; trace bilateral pleural effusion, moderate stool burden   Chronic constipation  - pt reports having diabetic neuropathy and chronic problems with constipation  - pt has been on regular laxatives and has had a bowel movement in last 24 hours    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 - increased prandial novolog to 14 units TID with meals - continue home detemir 50 units BID  - 04/15/23 A1C--9.1   Chronic atrial fibrillation - carvedilol for heart rate control - holding apixaban in anticipation for possible cath later this week  - IV heparin for anticoagulation    Hyperlipidemia - atorvastatin 80 mg daily    Essential Hypertension  - resumed home carvedilol with good BP control       Family Communication:   no Family at bedside  Consultants:  renal, cards  Code Status:  FULL  DVT Prophylaxis:  IV Heparin   Procedures: As Listed in Progress Note Above  Antibiotics: None      Subjective: Patient continues to have intermittent chest pain.  She has some intermittent shortness of breath.  She denies any nausea, vomiting, diarrhea, Donnell pain.  She denies any fevers or chills.  Objective: Vitals:   04/23/23 0110 04/23/23 0246 04/23/23 0443 04/23/23 1518  BP: (!) 140/68 (!) 142/56 (!) 156/74 124/77  Pulse: 70 64  69 65  Resp:  20  18  Temp:  98.1 F (36.7 C) 98.7 F (37.1 C) 98.5 F (36.9 C)  TempSrc:  Oral Oral Oral  SpO2:  96% 96% 96%  Weight:      Height:        Intake/Output Summary (Last 24 hours) at 04/23/2023 1705 Last data filed at 04/23/2023 1328 Gross per 24 hour  Intake 2019.14 ml  Output 2600 ml  Net -580.86 ml   Weight change:   Exam:  General:  Pt is alert, follows commands appropriately, not in acute distress HEENT: No icterus, No thrush, No neck mass, [place]/AT Cardiovascular: RRR, S1/S2, no rubs, no gallops Respiratory: Fine bibasilar crackles.  No wheezing.  Good air movement Abdomen: Soft/+BS, non tender, non distended, no guarding Extremities: No edema, No lymphangitis, No petechiae, No rashes, no synovitis   Data Reviewed: I have personally reviewed following labs and imaging studies Basic Metabolic Panel: Recent Labs  Lab 04/17/23 0636 04/18/23 0438 04/19/23 0448 04/20/23 0455 04/21/23 0449 04/22/23 0432 04/23/23 0439  NA 134*   < > 136 137 136 134* 136  K 3.9   < > 4.4 4.3 4.3 4.5 4.3  CL 104   < > 107 108 111 104 103  CO2 22   < > 19* 23 21* 19* 25  GLUCOSE 177*   < > 200* 147* 129* 156* 138*  BUN 22*   < > 19 20 18 19  21*  CREATININE 1.85*   < > 2.07* 2.17* 2.25* 2.33* 2.53*  CALCIUM 7.6*   < > 8.0* 7.8* 7.7* 8.2* 8.3*  MG 1.7  --   --   --  1.8  --   --   PHOS  --   --   --   --   --  3.6 4.2   < > = values in this interval not displayed.   Liver Function Tests: Recent Labs  Lab 04/22/23 0432 04/23/23 0439  ALBUMIN 3.0* 2.8*   Recent Labs  Lab 04/18/23 0948  LIPASE 34   No results for input(s): "AMMONIA" in the last 168 hours. Coagulation Profile: No results for input(s): "INR", "PROTIME" in the last 168 hours. CBC: Recent Labs  Lab 04/19/23 0448 04/20/23 0455 04/21/23 0449 04/22/23 0432 04/23/23 0439  WBC 3.8* 4.2 3.9* 5.6 3.9*  HGB 8.2* 8.2* 7.6* 9.0* 8.4*  HCT 26.8* 27.1* 25.0* 28.1* 27.4*  MCV 88.7 88.6 89.0 85.4 87.8  PLT 154 150 145* 144* 139*   Cardiac Enzymes: Recent Labs  Lab 04/23/23 0910  CKTOTAL 68   BNP: Invalid input(s): "POCBNP" CBG: Recent Labs  Lab 04/22/23 2044 04/23/23 0243 04/23/23 0802 04/23/23 1121 04/23/23 1625  GLUCAP 177* 138* 125* 207* 175*   HbA1C: No results for input(s): "HGBA1C" in the last 72 hours. Urine analysis:     Component Value Date/Time   COLORURINE YELLOW 04/21/2023 0726   APPEARANCEUR CLOUDY (A) 04/21/2023 0726   LABSPEC 1.006 04/21/2023 0726   PHURINE 5.0 04/21/2023 0726   GLUCOSEU NEGATIVE 04/21/2023 0726   HGBUR NEGATIVE 04/21/2023 0726   BILIRUBINUR NEGATIVE 04/21/2023 0726   KETONESUR NEGATIVE 04/21/2023 0726   PROTEINUR NEGATIVE 04/21/2023 0726   UROBILINOGEN 0.2 12/17/2011 2258   NITRITE NEGATIVE 04/21/2023 0726   LEUKOCYTESUR NEGATIVE 04/21/2023 0726   Sepsis Labs: @LABRCNTIP (procalcitonin:4,lacticidven:4) )No results found for this or any previous visit (from the past 240 hour(s)).   Scheduled Meds:  aspirin EC  81 mg Oral Daily   atorvastatin  80  mg Oral q morning   carvedilol  12.5 mg Oral BID WC   clonazePAM  0.5 mg Oral TID   clopidogrel  75 mg Oral Daily   gabapentin  300 mg Oral QHS   insulin aspart  0-15 Units Subcutaneous TID WC   insulin aspart  14 Units Subcutaneous TID WC   insulin detemir  50 Units Subcutaneous BID   isosorbide mononitrate  30 mg Oral q morning   lamoTRIgine  100 mg Oral BID   multivitamin with minerals  1 tablet Oral Daily   polyethylene glycol  17 g Oral BID   senna-docusate  2 tablet Oral QHS   sertraline  100 mg Oral Daily   Continuous Infusions:  heparin 1,950 Units/hr (04/23/23 1116)    Procedures/Studies: CT RENAL STONE STUDY  Result Date: 04/22/2023 CLINICAL DATA:  Left flank pain, nausea EXAM: CT ABDOMEN AND PELVIS WITHOUT CONTRAST TECHNIQUE: Multidetector CT imaging of the abdomen and pelvis was performed following the standard protocol without IV contrast. RADIATION DOSE REDUCTION: This exam was performed according to the departmental dose-optimization program which includes automated exposure control, adjustment of the mA and/or kV according to patient size and/or use of iterative reconstruction technique. COMPARISON:  04/18/2023 FINDINGS: Lower chest: Interval development of bibasilar peribronchovascular ground-glass pulmonary  infiltrates likely related to multifocal infection or noncardiogenic pulmonary edema. Trace bilateral pleural effusions. Extensive multi-vessel coronary artery calcification. Global cardiac size within normal limits. Hepatobiliary: No focal liver abnormality is seen. Status post cholecystectomy. No biliary dilatation. Pancreas: Unremarkable. No pancreatic ductal dilatation or surrounding inflammatory changes. Spleen: Mild splenomegaly is stable with the spleen measuring 15.6 cm in greatest dimension. This appears slightly progressive since remote prior examination 10/07/2022. No intrasplenic lesions identified on this noncontrast examination. Adrenals/Urinary Tract: The adrenal glands are unremarkable. The kidneys are normal in size and position. 1-2 mm punctate nonobstructing calculi are noted within the interpolar and lower polar regions of the kidneys bilaterally. No hydronephrosis. Minimal nonspecific bilateral perinephric stranding. No perinephric fluid collections. No hydronephrosis. No ureteral calculi. The bladder is unremarkable. Stomach/Bowel: Moderate colonic stool burden without evidence of obstruction. Status post subtotal appendectomy. The stomach, small bowel, and large bowel are otherwise unremarkable. No evidence of obstruction or focal inflammation. No free intraperitoneal gas or fluid. Vascular/Lymphatic: Aortic atherosclerosis. No enlarged abdominal or pelvic lymph nodes. Reproductive: Status post hysterectomy. No adnexal masses. Other: No abdominal wall hernia. Musculoskeletal: Osseous structures are age-appropriate. No acute bone abnormality. No lytic or blastic bone lesion. IMPRESSION: 1. Interval development of bibasilar peribronchovascular ground-glass pulmonary infiltrates likely related to multifocal infection or noncardiogenic pulmonary edema. Trace bilateral pleural effusions. 2. Extensive multi-vessel coronary artery calcification. 3. Mild splenomegaly, progressive since remote prior  examination of 10/07/2022. 4. Minimal bilateral nonobstructing nephrolithiasis. No urolithiasis. No hydronephrosis. 5. Moderate colonic stool burden without evidence of obstruction. 6. Aortic atherosclerosis. Aortic Atherosclerosis (ICD10-I70.0). Electronically Signed   By: Helyn Numbers M.D.   On: 04/22/2023 19:45   ECHOCARDIOGRAM LIMITED  Result Date: 04/22/2023    ECHOCARDIOGRAM LIMITED REPORT   Patient Name:   VANNESA FOGELBERG Bruntz Date of Exam: 04/22/2023 Medical Rec #:  [id number]      Height:       63.0 in Accession #:    [id number]     Weight:       245.0 lb Date of Birth:  Nov 13, 1960      BSA:          2.108 m Patient Age:    61 years  BP:           117/62 mmHg Patient Gender: F              HR:           67 bpm. [facility] Procedure: Limited Echo, Cardiac Doppler and Color Doppler Indications:    CHF                 IVC check  History:        Patient has prior history of Echocardiogram examinations, most                 recent 04/17/2023. CHF, CAD and Angina, Arrythmias:Atrial                 Fibrillation, Signs/Symptoms:Chest Pain; Risk                 Factors:Hypertension, Diabetes and Dyslipidemia. CKD.  Sonographer:    Mikki Harbor Referring Phys: [id number] VISHNU P MALLIPEDDI  Sonographer Comments: Patient is obese. IMPRESSIONS  1. Limited Echo for IVC check.  2. Left ventricular ejection fraction, by estimation, is 60 to 65%. The left ventricle has normal function. Left ventricular diastolic function could not be evaluated.  3. Right ventricular systolic function is normal. The right ventricular size is normal. There is normal pulmonary artery systolic pressure.  4. The inferior vena cava is normal in size with <50% respiratory variability, suggesting right atrial pressure of 8 mmHg. FINDINGS  Left Ventricle: Left ventricular ejection fraction, by estimation, is 60 to 65%. The left ventricle has normal function. The left ventricular internal cavity size was normal in size. There is no  left ventricular hypertrophy. Left ventricular diastolic function could not be evaluated. Right Ventricle: The right ventricular size is normal. Right ventricular systolic function is normal. There is normal pulmonary artery systolic pressure. The tricuspid regurgitant velocity is 2.52 m/s, and with an assumed right atrial pressure of 8 mmHg,  the estimated right ventricular systolic pressure is 33.4 mmHg. Left Atrium: Left atrial size was not assessed. Right Atrium: Right atrial size was not assessed. Pericardium: There is no evidence of pericardial effusion. Mitral Valve: The mitral valve is normal in structure. Tricuspid Valve: The tricuspid valve is normal in structure. Tricuspid valve regurgitation is trivial. Aortic Valve: The aortic valve is tricuspid. There is mild calcification of the aortic valve. There is mild thickening of the aortic valve. There is mild aortic valve annular calcification. Pulmonic Valve: The pulmonic valve was normal in structure. Aorta: The aortic root is normal in size and structure. Venous: The inferior vena cava is normal in size with less than 50% respiratory variability, suggesting right atrial pressure of 8 mmHg. IAS/Shunts: The interatrial septum was not assessed. LEFT VENTRICLE PLAX 2D LVIDd:         4.70 cm LVIDs:         3.00 cm LV PW:         1.20 cm LV IVS:        1.10 cm LVOT diam:     2.10 cm LVOT Area:     3.46 cm  LEFT ATRIUM         Index LA diam:    4.40 cm 2.09 cm/m   AORTA Ao Root diam: 3.10 cm TRICUSPID VALVE TR Peak grad:   25.4 mmHg TR Vmax:        252.00 cm/s  SHUNTS Systemic Diam: 2.10 cm Vishnu Priya Mallipeddi Electronically signed by Winfield Rast  Mallipeddi Signature Date/Time: 04/22/2023/5:07:38 PM    Final    DG CHEST PORT 1 VIEW  Result Date: 04/21/2023 CLINICAL DATA:  Chest pain EXAM: PORTABLE CHEST 1 VIEW COMPARISON:  04/20/2023 FINDINGS: The heart size and mediastinal contours are within normal limits. Both lungs are clear. The visualized skeletal  structures are unremarkable. IMPRESSION: No active disease. Electronically Signed   By: Charlett Nose M.D.   On: 04/21/2023 16:58   DG CHEST PORT 1 VIEW  Result Date: 04/20/2023 CLINICAL DATA:  [id number] Chest pain [id number] EXAM: PORTABLE CHEST - 1 VIEW COMPARISON:  04/15/2023 FINDINGS: Relatively low lung volumes with some crowding of bronchovascular structures centrally. No new infiltrate. No pneumothorax. Heart size and mediastinal contours are within normal limits. No effusion. Visualized bones unremarkable. IMPRESSION: Low lung volumes. No acute findings. Electronically Signed   By: Corlis Leak M.D.   On: 04/20/2023 10:03   US RENAL  Result Date: 04/19/2023 CLINICAL DATA:  Acute on chronic kidney injury EXAM: RENAL / URINARY TRACT ULTRASOUND COMPLETE COMPARISON:  CT abdomen pelvis 04/18/2023 FINDINGS: Right Kidney: Renal measurements: 9.4 x 4.9 x 4.4 cm = volume: 105 mL. Echogenicity within normal limits. No mass or hydronephrosis visualized. Left Kidney: Renal measurements: 11.6 x 5.6 x 5.0 cm = volume: 169 mL. Echogenicity within normal limits. No mass or hydronephrosis visualized. Bladder: Limited evaluation due to to collapsed configuration. Other: Diffuse increased echogenicity of the visualized portions of the hepatic parenchyma are a nonspecific indicator of hepatocellular dysfunction, most commonly steatosis. IMPRESSION: No significant sonographic abnormality of the kidneys. Electronically Signed   By: Acquanetta Belling M.D.   On: 04/19/2023 10:55   CT ABDOMEN PELVIS WO CONTRAST  Result Date: 04/18/2023 CLINICAL DATA:  Right lower quadrant abdominal pain EXAM: CT ABDOMEN AND PELVIS WITHOUT CONTRAST TECHNIQUE: Multidetector CT imaging of the abdomen and pelvis was performed following the standard protocol without IV contrast. RADIATION DOSE REDUCTION: This exam was performed according to the departmental dose-optimization program which includes automated exposure control, adjustment of the mA and/or kV  according to patient size and/or use of iterative reconstruction technique. COMPARISON:  None Available. FINDINGS: Lower chest: No acute findings. Coronary artery and aortic calcifications. Hepatobiliary: Prior cholecystectomy. Diffuse fatty infiltration of the liver. No focal hepatic abnormality. Mild hepatomegaly with craniocaudal length 23 cm. Pancreas: No focal abnormality or ductal dilatation. Spleen: Splenomegaly with a craniocaudal length of 14.5 cm. This is stable since prior study. No focal abnormality. Adrenals/Urinary Tract: Punctate bilateral nonobstructing renal calculi. No ureteral stones or hydronephrosis. Adrenal glands and urinary bladder unremarkable. Stomach/Bowel: Stomach, large and small bowel grossly unremarkable. Vascular/Lymphatic: Aortic atherosclerosis. No evidence of aneurysm or adenopathy. Reproductive: Prior hysterectomy.  No adnexal masses. Other: No free fluid or free air. Musculoskeletal: No acute bony abnormality. IMPRESSION: Hepatic steatosis.  Hepatosplenomegaly. No acute findings in the abdomen or pelvis. Aortic atherosclerosis, coronary artery disease. Punctate bilateral nephrolithiasis.  No hydronephrosis. Electronically Signed   By: Charlett Nose M.D.   On: 04/18/2023 19:22   ECHOCARDIOGRAM COMPLETE  Result Date: 04/17/2023    ECHOCARDIOGRAM REPORT   Patient Name:   MAHELET BLOMGREN Kreutzer Date of Exam: 04/17/2023 Medical Rec #:  [id number]      Height:       63.0 in Accession #:    [id number]     Weight:       245.0 lb Date of Birth:  [birth year]      BSA:          2.108 m Patient Age:  61 years       BP:           136/73 mmHg Patient Gender: F              HR:           71 bpm. [facility] Procedure: 2D Echo, Cardiac Doppler and Color Doppler Indications:    Chest Pain R07.9  History:        Patient has no prior history of Echocardiogram examinations.                 CAD, Arrythmias:Atrial Fibrillation, Signs/Symptoms:Chest Pain;                 Risk  Factors:Dyslipidemia, Hypertension and Diabetes.  Sonographer:    Aron Baba Referring Phys: [id number] Ellsworth Lennox  Sonographer Comments: Patient is obese. IMPRESSIONS  1. Left ventricular ejection fraction, by estimation, is 55 to 60%. The left ventricle has normal function. The left ventricle has no regional wall motion abnormalities. Left ventricular diastolic parameters are consistent with Grade I diastolic dysfunction (impaired relaxation).  2. Right ventricular systolic function is normal. The right ventricular size is normal. Tricuspid regurgitation signal is inadequate for assessing PA pressure.  3. The mitral valve is abnormal. Mild mitral valve regurgitation. No evidence of mitral stenosis.  4. The aortic valve is tricuspid. Aortic valve regurgitation is not visualized. No aortic stenosis is present.  5. Aortic dilatation noted. 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  6. The inferior vena cava is normal in size with greater than 50% respiratory variability, suggesting right atrial pressure of 3 mmHg. FINDINGS  Left Ventricle: Left ventricular ejection fraction, by estimation, is 55 to 60%. The left ventricle has normal function. The left ventricle has no regional wall motion abnormalities. The left ventricular internal cavity size was normal in size. There is  no left ventricular hypertrophy. Left ventricular diastolic parameters are consistent with Grade I diastolic dysfunction (impaired relaxation). Normal left ventricular filling pressure. Right Ventricle: The right ventricular size is normal. Right vetricular wall thickness was not well visualized. Right ventricular systolic function is normal. Tricuspid regurgitation signal is inadequate for assessing PA pressure. Left Atrium: Left atrial size was normal in size. Right Atrium: Right atrial size was normal in size. Pericardium: There is no evidence of pericardial effusion. Mitral Valve: The mitral valve is abnormal.  Mild mitral valve regurgitation. No evidence of mitral valve stenosis. Tricuspid Valve: The tricuspid valve is normal in structure. Tricuspid valve regurgitation is trivial. No evidence of tricuspid stenosis. Aortic Valve: The aortic valve is tricuspid. Aortic valve regurgitation is not visualized. No aortic stenosis is present. Aortic valve mean gradient measures 5.4 mmHg. Aortic valve peak gradient measures 8.7 mmHg. Aortic valve area, by VTI measures 1.85 cm. Pulmonic Valve: The pulmonic valve was not well visualized. Pulmonic valve regurgitation is not visualized. No evidence of pulmonic stenosis. Aorta: The aortic root is normal in size and structure and aortic dilatation noted. 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 Venous: The inferior vena cava is normal in size with greater than 50% respiratory variability, suggesting right atrial pressure of 3 mmHg. IAS/Shunts: No atrial level shunt detected by color flow Doppler.  LEFT VENTRICLE PLAX 2D LVIDd:         5.00 cm   Diastology LVIDs:         3.60 cm   LV e' medial:    13.20  cm/s LV PW:         0.90 cm   LV E/e' medial:  5.8 LV IVS:        0.80 cm   LV e' lateral:   6.22 cm/s LVOT diam:     2.00 cm   LV E/e' lateral: 12.3 LV SV:         67 LV SV Index:   32 LVOT Area:     3.14 cm  RIGHT VENTRICLE RV S prime:     12.60 cm/s TAPSE (M-mode): 2.2 cm LEFT ATRIUM             Index        RIGHT ATRIUM           Index LA diam:        4.00 cm 1.90 cm/m   RA Area:     10.10 cm LA Vol (A2C):   69.3 ml 32.88 ml/m  RA Volume:   20.40 ml  9.68 ml/m LA Vol (A4C):   65.9 ml 31.26 ml/m LA Biplane Vol: 70.8 ml 33.59 ml/m  AORTIC VALVE AV Area (Vmax):    1.94 cm AV Area (Vmean):   1.69 cm AV Area (VTI):     1.85 cm AV Vmax:           147.81 cm/s AV Vmean:          109.081 cm/s AV VTI:            0.360 m AV Peak Grad:      8.7 mmHg AV Mean Grad:      5.4 mmHg LVOT Vmax:         91.34 cm/s LVOT Vmean:        58.622 cm/s LVOT VTI:          0.212  m LVOT/AV VTI ratio: 0.59  AORTA Ao Root diam: 3.40 cm Ao Asc diam:  3.60 cm MITRAL VALVE MV Area (PHT): 4.71 cm     SHUNTS MV Decel Time: 161 msec     Systemic VTI:  0.21 m MR Peak grad: 82.2 mmHg     Systemic Diam: 2.00 cm MR Vmax:      453.33 cm/s MV E velocity: 76.60 cm/s MV A velocity: 108.00 cm/s MV E/A ratio:  0.71 Dina Rich MD Electronically signed by Dina Rich MD Signature Date/Time: 04/17/2023/1:33:26 PM    Final    CT CHEST ABDOMEN PELVIS WO CONTRAST  Result Date: 04/16/2023 CLINICAL DATA:  Sharp chest pain radiating to back. Left-sided rib pain and right-sided abdominal pain. EXAM: CT CHEST, ABDOMEN AND PELVIS WITHOUT CONTRAST TECHNIQUE: Multidetector CT imaging of the chest, abdomen and pelvis was performed following the standard protocol without IV contrast. RADIATION DOSE REDUCTION: This exam was performed according to the departmental dose-optimization program which includes automated exposure control, adjustment of the mA and/or kV according to patient size and/or use of iterative reconstruction technique. COMPARISON:  10/07/2022. FINDINGS: CT CHEST FINDINGS Cardiovascular: The heart is normal in size and there is no pericardial effusion. Three-vessel coronary artery calcifications are noted. There is atherosclerotic calcification of the aorta without evidence of aneurysm. The pulmonary trunk is normal in caliber. Mediastinum/Nodes: No mediastinal or hilar lymphadenopathy. Evaluation of the hila is limited due to lack of IV contrast. Coarse calcification is noted in the left lobe of the thyroid gland. The trachea and esophagus are within normal limits. Lungs/Pleura: Mild atelectasis is present bilaterally. No effusion or pneumothorax. Small calcified granuloma are present bilaterally. Musculoskeletal:  Degenerative changes are present in the thoracic spine. No acute fracture is seen. CT ABDOMEN PELVIS FINDINGS Hepatobiliary: No focal liver abnormality is seen. The liver is mildly  enlarged. No gallstones, gallbladder wall thickening, or biliary dilatation. Pancreas: Unremarkable. No pancreatic ductal dilatation or surrounding inflammatory changes. Spleen: The spleen is enlarged at 14.1 cm in length. No focal abnormality. Adrenals/Urinary Tract: The adrenal glands are within normal limits. A nonobstructive renal calculus is noted on the right. No hydroureteronephrosis bilaterally. The bladder is unremarkable. Stomach/Bowel: Stomach is within normal limits. Appendix is surgically absent. No evidence of bowel wall thickening, distention, or inflammatory changes. No free air or pneumatosis. Vascular/Lymphatic: Aortic atherosclerosis. No enlarged abdominal or pelvic lymph nodes. Reproductive: Status post hysterectomy. No adnexal masses. Other: No abdominopelvic ascites. Subcutaneous fat stranding is noted in the anterior abdominal wall in the left lower quadrant, unchanged from the prior exam. Musculoskeletal: Degenerative changes are present in the lumbar spine. No acute fracture. IMPRESSION: 1. No acute process in the chest, abdomen, or pelvis. 2. Nonobstructive right renal calculus. 3. Mild hepatosplenomegaly. 4. Subcutaneous fat stranding in the anterior abdominal wall in the left lower quadrant, unchanged from the prior exam, possible cellulitis. 5. Coronary artery calcifications. 6. Aortic atherosclerosis. Electronically Signed   By: Thornell Sartorius M.D.   On: 04/16/2023 00:19   DG Chest 2 View  Result Date: 04/15/2023 CLINICAL DATA:  Left-sided chest pain for 2 days EXAM: CHEST - 2 VIEW COMPARISON:  03/30/2023 FINDINGS: Frontal and lateral views of the chest demonstrate an unremarkable cardiac silhouette. No airspace disease, effusion, or pneumothorax. No acute bony abnormality. IMPRESSION: 1. No acute intrathoracic process. Electronically Signed   By: Sharlet Salina M.D.   On: 04/15/2023 22:33   DG Chest 2 View  Result Date: 03/30/2023 CLINICAL DATA:  Chest pain EXAM: CHEST - 2 VIEW  COMPARISON:  02/08/2023 FINDINGS: The heart size and mediastinal contours are within normal limits. Both lungs are clear. The visualized skeletal structures are unremarkable. IMPRESSION: No active cardiopulmonary disease. Electronically Signed   By: Ernie Avena M.D.   On: 03/30/2023 19:01    Catarina Hartshorn, DO  [facility]  If 7PM-7AM, please contact night-coverage [email or web address] Password [email or web address] 04/23/2023, 5:05 PM   LOS: 6 days

## 2023-04-23 NOTE — Plan of Care (Signed)
  Problem: Education: Goal: Ability to describe self-care measures that may prevent or decrease complications (Diabetes Survival Skills Education) will improve Outcome: Progressing   Problem: Education: Goal: Individualized Educational Video(s) Outcome: Progressing   Problem: Coping: Goal: Ability to adjust to condition or change in health will improve Outcome: Progressing

## 2023-04-23 NOTE — Progress Notes (Addendum)
[name] Progress Note    Assessment/ Plan:   AKI on CKD3 -Baseline creatinine typically around 1.1-1.4. Thought that her normal saline was causing some worsening AKI along with worsening anemia initially. Stopped NS on 8/5 given concern for AKI being induced by this. Cr continues to worsen which is odd. Her CT renal stone does show a definitive cause for her AKI, no obstruction, punctate stones are likely not the cause. UA is bland. Cr up to 2.5 despite fluids -she remains nonoliguric fortunately. -Will c/w fluids, echo looks reasonable. Given some proximal pain, will check CK levels. Will also pursue duplex study (will need to be done at [facility]) -hopefully can undergo cardiac cath once Cr improves/stabilizes. May very well need LR around/during contrast exposure to prevent CIN -Avoid nephrotoxic medications including NSAIDs and iodinated intravenous contrast exposure unless the latter is absolutely indicated.  Preferred narcotic agents for pain control are hydromorphone, fentanyl, and methadone. Morphine should not be used. Avoid Baclofen and avoid oral sodium phosphate and magnesium citrate based laxatives / bowel preps. Continue strict Input and Output monitoring. Will monitor the patient closely with you and intervene or adjust therapy as indicated by changes in clinical status/labs    Chest pain -high risk for cardiac etiology. Cardiology following, currently on DAPT and heparin gtt. Pending cath provided Cr improves   HTN -BP reasonably controlled   Anemia -Transfuse for Hgb<8 g/dL. On DAPT and heparin gtt -s/p 1u prbc 8/5, hgb up to 9 yesterday, down to 8.4. Would recommend checking at least a FOBT, will defer to primary service (she has a self described history of GIB in the past)   Uncontrolled Diabetes Mellitus Type 2 with Hyperglycemia -per primary   Abdominal pain -s/p ct renal 8/6:  interval development of bibasilar peribronchovascular ground glass infiltrates  (multifocal pneumonia or pulmonary edema), trace b/l pleural effusions, extensive multivessel coronary artery calcification, progressive splenomegaly, minimal bilateral nonobstructive nephrolithiasis (no obstruction), moderate colonic stool burden without obstruction, aortic atherosclerosis  SOB -she does report a sick contact at home (grandson who has a 'cold'). CT renal stone yesterday with possible pneumonia vs edema? May be worth doing an infectious work up-will defer to primary service  Discussed with cardiology.  Holly Sar, MD [facility]  Subjective:   Patient seen and examined bedside. Uop charted 2L Patient seen and examined bedside. Same issues: chest pain and abd pain She reports that she is making a lot of urine. She does report some shoulder and hip pain. Has been SOB as well, unchanged. She does report a sick contact at home prior to coming to the hospital   Objective:   BP (!) 156/74 (BP Location: Left Arm)   Pulse 69   Temp 98.7 F (37.1 C) (Oral)   Resp 20   Ht 5\' 3"  (1.6 m)   Wt 111.1 kg   SpO2 96%   BMI 43.40 kg/m   Intake/Output Summary (Last 24 hours) at 04/23/2023 0751 Last data filed at 04/23/2023 0551 Gross per 24 hour  Intake 1899.14 ml  Output 2000 ml  Net -100.86 ml   Weight change:   Physical Exam: Gen: NAD, sitting up in bed eating breakfast CVS: RRR Resp: CTA B/L, normal WOB Abd: TTP RLQ, soft, ND Ext: no edema b/l Les Neuro: awake, alert  Imaging: CT RENAL STONE STUDY  Result Date: 04/22/2023 CLINICAL DATA:  Left flank pain, nausea EXAM: CT ABDOMEN AND PELVIS WITHOUT CONTRAST TECHNIQUE: Multidetector CT imaging of the abdomen and pelvis  was performed following the standard protocol without IV contrast. RADIATION DOSE REDUCTION: This exam was performed according to the departmental dose-optimization program which includes automated exposure control, adjustment of the mA and/or kV according to patient size and/or use of  iterative reconstruction technique. COMPARISON:  04/18/2023 FINDINGS: Lower chest: Interval development of bibasilar peribronchovascular ground-glass pulmonary infiltrates likely related to multifocal infection or noncardiogenic pulmonary edema. Trace bilateral pleural effusions. Extensive multi-vessel coronary artery calcification. Global cardiac size within normal limits. Hepatobiliary: No focal liver abnormality is seen. Status post cholecystectomy. No biliary dilatation. Pancreas: Unremarkable. No pancreatic ductal dilatation or surrounding inflammatory changes. Spleen: Mild splenomegaly is stable with the spleen measuring 15.6 cm in greatest dimension. This appears slightly progressive since remote prior examination 10/07/2022. No intrasplenic lesions identified on this noncontrast examination. Adrenals/Urinary Tract: The adrenal glands are unremarkable. The kidneys are normal in size and position. 1-2 mm punctate nonobstructing calculi are noted within the interpolar and lower polar regions of the kidneys bilaterally. No hydronephrosis. Minimal nonspecific bilateral perinephric stranding. No perinephric fluid collections. No hydronephrosis. No ureteral calculi. The bladder is unremarkable. Stomach/Bowel: Moderate colonic stool burden without evidence of obstruction. Status post subtotal appendectomy. The stomach, small bowel, and large bowel are otherwise unremarkable. No evidence of obstruction or focal inflammation. No free intraperitoneal gas or fluid. Vascular/Lymphatic: Aortic atherosclerosis. No enlarged abdominal or pelvic lymph nodes. Reproductive: Status post hysterectomy. No adnexal masses. Other: No abdominal wall hernia. Musculoskeletal: Osseous structures are age-appropriate. No acute bone abnormality. No lytic or blastic bone lesion. IMPRESSION: 1. Interval development of bibasilar peribronchovascular ground-glass pulmonary infiltrates likely related to multifocal infection or noncardiogenic  pulmonary edema. Trace bilateral pleural effusions. 2. Extensive multi-vessel coronary artery calcification. 3. Mild splenomegaly, progressive since remote prior examination of 10/07/2022. 4. Minimal bilateral nonobstructing nephrolithiasis. No urolithiasis. No hydronephrosis. 5. Moderate colonic stool burden without evidence of obstruction. 6. Aortic atherosclerosis. Aortic Atherosclerosis (ICD10-I70.0). Electronically Signed   By: Helyn Numbers M.D.   On: 04/22/2023 19:45   ECHOCARDIOGRAM LIMITED  Result Date: 04/22/2023    ECHOCARDIOGRAM LIMITED REPORT   Patient Name:   [name] Date of Exam: 04/22/2023 Medical Rec #:  [id number]      Height:       63.0 in Accession #:    [id number]     Weight:       245.0 lb Date of Birth:  04-17-1961      BSA:          2.108 m Patient Age:    61 years       BP:           117/62 mmHg Patient Gender: F              HR:           67 bpm. [facility] Procedure: Limited Echo, Cardiac Doppler and Color Doppler Indications:    CHF                 IVC check  History:        Patient has prior history of Echocardiogram examinations, most                 recent 04/17/2023. CHF, CAD and Angina, Arrythmias:Atrial                 Fibrillation, Signs/Symptoms:Chest Pain; Risk                 Factors:Hypertension, Diabetes and Dyslipidemia. CKD.  Sonographer:    Mikki Harbor Referring Phys: [id number] VISHNU P MALLIPEDDI  Sonographer Comments: Patient is obese. IMPRESSIONS  1. Limited Echo for IVC check.  2. Left ventricular ejection fraction, by estimation, is 60 to 65%. The left ventricle has normal function. Left ventricular diastolic function could not be evaluated.  3. Right ventricular systolic function is normal. The right ventricular size is normal. There is normal pulmonary artery systolic pressure.  4. The inferior vena cava is normal in size with <50% respiratory variability, suggesting right atrial pressure of 8 mmHg. FINDINGS  Left Ventricle: Left ventricular  ejection fraction, by estimation, is 60 to 65%. The left ventricle has normal function. The left ventricular internal cavity size was normal in size. There is no left ventricular hypertrophy. Left ventricular diastolic function could not be evaluated. Right Ventricle: The right ventricular size is normal. Right ventricular systolic function is normal. There is normal pulmonary artery systolic pressure. The tricuspid regurgitant velocity is 2.52 m/s, and with an assumed right atrial pressure of 8 mmHg,  the estimated right ventricular systolic pressure is 33.4 mmHg. Left Atrium: Left atrial size was not assessed. Right Atrium: Right atrial size was not assessed. Pericardium: There is no evidence of pericardial effusion. Mitral Valve: The mitral valve is normal in structure. Tricuspid Valve: The tricuspid valve is normal in structure. Tricuspid valve regurgitation is trivial. Aortic Valve: The aortic valve is tricuspid. There is mild calcification of the aortic valve. There is mild thickening of the aortic valve. There is mild aortic valve annular calcification. Pulmonic Valve: The pulmonic valve was normal in structure. Aorta: The aortic root is normal in size and structure. Venous: The inferior vena cava is normal in size with less than 50% respiratory variability, suggesting right atrial pressure of 8 mmHg. IAS/Shunts: The interatrial septum was not assessed. LEFT VENTRICLE PLAX 2D LVIDd:         4.70 cm LVIDs:         3.00 cm LV PW:         1.20 cm LV IVS:        1.10 cm LVOT diam:     2.10 cm LVOT Area:     3.46 cm  LEFT ATRIUM         Index LA diam:    4.40 cm 2.09 cm/m   AORTA Ao Root diam: 3.10 cm TRICUSPID VALVE TR Peak grad:   25.4 mmHg TR Vmax:        252.00 cm/s  SHUNTS Systemic Diam: 2.10 cm Vishnu Priya Mallipeddi Electronically signed by Winfield Rast Mallipeddi Signature Date/Time: 04/22/2023/5:07:38 PM    Final    DG CHEST PORT 1 VIEW  Result Date: 04/21/2023 CLINICAL DATA:  Chest pain EXAM:  PORTABLE CHEST 1 VIEW COMPARISON:  04/20/2023 FINDINGS: The heart size and mediastinal contours are within normal limits. Both lungs are clear. The visualized skeletal structures are unremarkable. IMPRESSION: No active disease. Electronically Signed   By: Charlett Nose M.D.   On: 04/21/2023 16:58    Labs: BMET Recent Labs  Lab 04/17/23 0636 04/18/23 0438 04/19/23 0448 04/20/23 0455 04/21/23 0449 04/22/23 0432 04/23/23 0439  NA 134* 135 136 137 136 134* 136  K 3.9 4.1 4.4 4.3 4.3 4.5 4.3  CL 104 105 107 108 111 104 103  CO2 22 23 19* 23 21* 19* 25  GLUCOSE 177* 201* 200* 147* 129* 156* 138*  BUN 22* 20 19 20 18 19  21*  CREATININE 1.85* 1.96* 2.07* 2.17* 2.25* 2.33* 2.53*  CALCIUM 7.6* 7.7* 8.0* 7.8* 7.7* 8.2* 8.3*  PHOS  --   --   --   --   --  3.6 4.2   CBC Recent Labs  Lab 04/20/23 0455 04/21/23 0449 04/22/23 0432 04/23/23 0439  WBC 4.2 3.9* 5.6 3.9*  HGB 8.2* 7.6* 9.0* 8.4*  HCT 27.1* 25.0* 28.1* 27.4*  MCV 88.6 89.0 85.4 87.8  PLT 150 145* 144* 139*    Medications:     aspirin EC  81 mg Oral Daily   atorvastatin  80 mg Oral q morning   carvedilol  12.5 mg Oral BID WC   clonazePAM  0.5 mg Oral TID   clopidogrel  75 mg Oral Daily   gabapentin  300 mg Oral QHS   insulin aspart  0-15 Units Subcutaneous TID WC   insulin aspart  14 Units Subcutaneous TID WC   insulin detemir  50 Units Subcutaneous BID   isosorbide mononitrate  30 mg Oral q morning   lamoTRIgine  100 mg Oral BID   multivitamin with minerals  1 tablet Oral Daily   polyethylene glycol  17 g Oral BID   senna-docusate  2 tablet Oral QHS   sertraline  100 mg Oral Daily      Holly Sar, MD [facility] 04/23/2023, 7:51 AM

## 2023-04-23 NOTE — Progress Notes (Signed)
Physical Therapy Treatment Patient Details Name: [name] MRN: [id number] DOB: May 07, 1961 Today's Date: 04/23/2023   History of Present Illness 62 y.o. female with medical history significant of CAD s/p PCI, hypertension, hyperlipidemia, T2DM, DVT on Eliquis who presents to the emergency department with complaints of chest pain.  Patient complained of left-sided chest pain which occurred yesterday while sitting in a car chest pain was described as sharp and nonreproducible and was rated as 8/10 on pain scale with radiation to the back, this was associated with dizziness and nausea without vomiting.  She states that she called her PCP who advised her to go to the ED for further evaluation and management.  Patient denies shortness of breath, diaphoresis, fever.    PT Comments  Pt tolerated treatment well but length of ambulation was limited due to fatigue and reports of dyspnea on exertion. Pt required min contact guard during ambulation due to often reaching for objects/leaning on wall and utilized RW to ensure optimal safety. Patient will benefit from continued skilled physical therapy in hospital and recommended venue below to increase strength, balance, endurance for safe ADLs and gait.    If plan is discharge home, recommend the following: A little help with walking and/or transfers;Help with stairs or ramp for entrance;Assistance with cooking/housework   Can travel by private vehicle        Equipment Recommendations  Rolling walker (2 wheels)    Recommendations for Other Services       Precautions / Restrictions Precautions Precautions: Fall Precaution Comments: patient reports 3 falls in the last six months Restrictions Weight Bearing Restrictions: No     Mobility  Bed Mobility               General bed mobility comments: Pt presented upright in chair    Transfers Overall transfer level: Modified independent Equipment used: None Transfers: Sit to/from Stand Sit  to Stand: Supervision, Contact guard assist           General transfer comment: assistance for safety and for IV line management    Ambulation/Gait Ambulation/Gait assistance: Supervision, Contact guard assist Gait Distance (Feet): 75 Feet Assistive device: IV Pole, Rolling walker (2 wheels) Gait Pattern/deviations: Step-through pattern, Decreased step length - right, Decreased step length - left, Decreased stride length, Wide base of support Gait velocity: decreased     General Gait Details: slow, labored cadence using IV pole and reaching for objects; complaints of dyspnea on exertion; limited by fatigue   Stairs             Wheelchair Mobility     Tilt Bed    Modified Rankin (Stroke Patients Only)       Balance Overall balance assessment: History of Falls, Modified Independent Sitting-balance support: No upper extremity supported, Feet supported Sitting balance-Leahy Scale: Good Sitting balance - Comments: sitting upright in chair   Standing balance support: During functional activity, Single extremity supported (leans on objects/wall often) Standing balance-Leahy Scale: Fair Standing balance comment: fair with RW                            Cognition Arousal: Alert Behavior During Therapy: WFL for tasks assessed/performed Overall Cognitive Status: Within Functional Limits for tasks assessed  Exercises General Exercises - Lower Extremity Ankle Circles/Pumps: AROM, Seated, Strengthening, Right, Left, 10 reps Long Arc Quad: AROM, Seated, Strengthening, Right, Left, 10 reps Hip Flexion/Marching: AROM, Seated, Strengthening, Right, Left, 10 reps Toe Raises: AROM, Seated, Strengthening, Right, Left, 10 reps Heel Raises: AROM, Seated, Strengthening, Right, Left, 10 reps    General Comments        Pertinent Vitals/Pain Pain Assessment Pain Assessment: No/denies pain    Home Living                           Prior Function            PT Goals (current goals can now be found in the care plan section) Acute Rehab PT Goals Patient Stated Goal: Go home and feel better. PT Goal Formulation: With patient Time For Goal Achievement: 05/05/23 Potential to Achieve Goals: Good Progress towards PT goals: Progressing toward goals    Frequency    Min 3X/week      PT Plan      Co-evaluation              AM-PAC PT "6 Clicks" Mobility   Outcome Measure  Help needed turning from your back to your side while in a flat bed without using bedrails?: None Help needed moving from lying on your back to sitting on the side of a flat bed without using bedrails?: None Help needed moving to and from a bed to a chair (including a wheelchair)?: A Little Help needed standing up from a chair using your arms (e.g., wheelchair or bedside chair)?: A Little Help needed to walk in hospital room?: A Little Help needed climbing 3-5 steps with a railing? : A Little 6 Click Score: 20    End of Session   Activity Tolerance: Patient limited by fatigue Patient left: in chair;with call bell/phone within reach Nurse Communication: Mobility status PT Visit Diagnosis: Unsteadiness on feet (R26.81);History of falling (Z91.81);Other abnormalities of gait and mobility (R26.89)     Time: 8119-1478 PT Time Calculation (min) (ACUTE ONLY): 22 min  Charges:    $Gait Training: 8-22 mins $Therapeutic Exercise: 8-22 mins PT General Charges $$ ACUTE PT VISIT: 1 Visit                      [facility] [place], DPT Program

## 2023-04-23 NOTE — Progress Notes (Signed)
Mobility Specialist Progress Note:    04/23/23 1430  Mobility  Activity Ambulated with assistance in room  Level of Assistance Minimal assist, patient does 75% or more  Assistive Device None  Distance Ambulated (ft) 5 ft  Range of Motion/Exercises Active;All extremities  Activity Response Tolerated well  Mobility Referral Yes  $Mobility charge 1 Mobility  Mobility Specialist Start Time (ACUTE ONLY) 1430  Mobility Specialist Stop Time (ACUTE ONLY) 1440  Mobility Specialist Time Calculation (min) (ACUTE ONLY) 10 min   Pt was received in chair, agreeable to mobility session. Required MinA to walk and ambulate with no AD. Tolerated well, experienced lightheadedness during ambulation. Returned pt to chair, call bell in reach. All needs met.   Lawerance Bach Mobility Specialist Please contact via Special educational needs teacher or  Rehab office at [phone]

## 2023-04-24 DIAGNOSIS — E118 Type 2 diabetes mellitus with unspecified complications: Secondary | ICD-10-CM | POA: Diagnosis not present

## 2023-04-24 DIAGNOSIS — R0789 Other chest pain: Secondary | ICD-10-CM | POA: Diagnosis not present

## 2023-04-24 DIAGNOSIS — N1832 Chronic kidney disease, stage 3b: Secondary | ICD-10-CM | POA: Diagnosis not present

## 2023-04-24 DIAGNOSIS — E1165 Type 2 diabetes mellitus with hyperglycemia: Secondary | ICD-10-CM | POA: Diagnosis not present

## 2023-04-24 DIAGNOSIS — R079 Chest pain, unspecified: Secondary | ICD-10-CM | POA: Diagnosis not present

## 2023-04-24 DIAGNOSIS — D649 Anemia, unspecified: Secondary | ICD-10-CM | POA: Diagnosis not present

## 2023-04-24 DIAGNOSIS — I5032 Chronic diastolic (congestive) heart failure: Secondary | ICD-10-CM

## 2023-04-24 DIAGNOSIS — I200 Unstable angina: Secondary | ICD-10-CM | POA: Diagnosis not present

## 2023-04-24 LAB — GLUCOSE, CAPILLARY
Glucose-Capillary: 177 mg/dL — ABNORMAL HIGH (ref 70–99)
Glucose-Capillary: 171 mg/dL — ABNORMAL HIGH (ref 70–99)
Glucose-Capillary: 197 mg/dL — ABNORMAL HIGH (ref 70–99)
Glucose-Capillary: 208 mg/dL — ABNORMAL HIGH (ref 70–99)
Glucose-Capillary: 176 mg/dL — ABNORMAL HIGH (ref 70–99)
Glucose-Capillary: 92 mg/dL (ref 70–99)
Glucose-Capillary: 113 mg/dL — ABNORMAL HIGH (ref 70–99)

## 2023-04-24 LAB — TROPONIN I (HIGH SENSITIVITY)
Troponin I (High Sensitivity): 4 ng/L (ref <18)
Troponin I (High Sensitivity): 4 ng/L (ref <18)

## 2023-04-24 LAB — HEPARIN LEVEL (UNFRACTIONATED): Heparin Unfractionated: 0.39 IU/mL (ref 0.30–0.70)

## 2023-04-24 MED ORDER — ISOSORBIDE MONONITRATE ER 60 MG PO TB24
60.0000 mg | ORAL_TABLET | Freq: Every morning | ORAL | Status: DC
  Administered 2023-04-25 – 2023-04-30 (×6): 60 mg via ORAL
  Filled 2023-04-24 (×6): qty 1

## 2023-04-24 MED ORDER — ISOSORBIDE MONONITRATE ER 60 MG PO TB24
30.0000 mg | ORAL_TABLET | Freq: Once | ORAL | Status: AC
  Administered 2023-04-24: 30 mg via ORAL
  Filled 2023-04-24: qty 1

## 2023-04-24 NOTE — Progress Notes (Signed)
ANTICOAGULATION CONSULT NOTE   Pharmacy Consult for Heparin Indication: chest pain/ACS   Patient Measurements: Height: 5\' 3"  (160 cm) Weight: 115.9 kg (255 lb 8.2 oz) IBW/kg (Calculated) : 52.4 HEPARIN DW (KG): 79.2   Vital Signs:    Labs: Recent Labs    04/22/23 0432 04/22/23 1502 04/23/23 0439 04/23/23 0910 04/24/23 0447 04/24/23 0833  HGB 9.0*  --  8.4*  --  9.0*  --   HCT 28.1*  --  27.4*  --  29.4*  --   PLT 144*  --  139*  --  155  --   HEPARINUNFRC 0.24* 0.30 0.35  --   --  0.39  CREATININE 2.33*  --  2.53*  --  2.56*  --   CKTOTAL  --   --   --  68  --   --   TROPONINIHS 4  --   --   --   --   --     Estimated Creatinine Clearance: 28.7 mL/min (A) (by C-G formula based on SCr of 2.56 mg/dL (H)).   Medical History: Past Medical History:  Diagnosis Date   Asthma    Atrial fibrillation (HCC)    Bipolar 1 disorder (HCC)    Chronic back pain    Chronic chest pain    Diabetes mellitus without complication (HCC)    Gastroparesis    GERD (gastroesophageal reflux disease)    Hyperlipemia    Hypertension    IBS (irritable bowel syndrome)    Normal cardiac stress test 02/2014   [facility]   Assessment: Patient present with chest pain with atypical features, Dyspnea on exertion and diaphoresis. 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 Cardiology plans to do cath and is bridging with heparin.  Heparin level therapeutic at 0.39 Hgb  9.0  Goal of Therapy:  Heparin level 0.3-0.7 units/ml Monitor platelets by anticoagulation protocol: Yes   Plan:  Continue heparin infusion at 1950/hr Check anti-Xa level daily Continue to monitor H&H and platelets  Judeth Cornfield, PharmD Clinical Pharmacist 04/24/2023 9:27 AM

## 2023-04-24 NOTE — Progress Notes (Signed)
Progress Note  Patient Name: [name] Date of Encounter: 04/24/2023  Primary Cardiologist: Marjo Bicker, MD  Subjective   No acute events overnight, continues to have DOE and chest pain.  Troponins and EKG from 2 days ago were within normal limits.  Inpatient Medications    Scheduled Meds:  aspirin EC  81 mg Oral Daily   atorvastatin  80 mg Oral q morning   carvedilol  12.5 mg Oral BID WC   clonazePAM  0.5 mg Oral TID   clopidogrel  75 mg Oral Daily   gabapentin  300 mg Oral QHS   insulin aspart  0-15 Units Subcutaneous TID WC   insulin aspart  14 Units Subcutaneous TID WC   insulin detemir  50 Units Subcutaneous BID   isosorbide mononitrate  30 mg Oral Once   [START ON 04/25/2023] isosorbide mononitrate  60 mg Oral q morning   lamoTRIgine  100 mg Oral BID   multivitamin with minerals  1 tablet Oral Daily   polyethylene glycol  17 g Oral BID   senna-docusate  2 tablet Oral QHS   sertraline  100 mg Oral Daily   Continuous Infusions:  heparin 1,950 Units/hr (04/24/23 0030)   PRN Meds: acetaminophen **OR** acetaminophen, bisacodyl, diphenhydrAMINE, fentaNYL (SUBLIMAZE) injection, ondansetron **OR** ondansetron (ZOFRAN) IV, mouth rinse, oxyCODONE, prochlorperazine   Vital Signs    Vitals:   04/23/23 1518 04/23/23 2038 04/23/23 2111 04/24/23 0426  BP: 124/77 (!) 152/51 (!) 144/96   Pulse: 65 73 78   Resp: 18 18 18    Temp: 98.5 F (36.9 C) 98.5 F (36.9 C) 97.8 F (36.6 C)   TempSrc: Oral Oral Oral   SpO2: 96% 98% 97%   Weight:    115.9 kg  Height:        Intake/Output Summary (Last 24 hours) at 04/24/2023 1248 Last data filed at 04/24/2023 1100 Gross per 24 hour  Intake 480 ml  Output 1800 ml  Net -1320 ml   Filed Weights   04/15/23 2144 04/16/23 1344 04/24/23 0426  Weight: 111.1 kg 111.1 kg 115.9 kg    Telemetry     Personally reviewed, NSR.  ECG    Not performed today  Physical Exam   GEN: No acute distress.   Neck: No JVD. Cardiac:  RRR, no murmur, rub, or gallop.  Respiratory: Nonlabored. Clear to auscultation bilaterally. GI: Soft, nontender, bowel sounds present. MS: No edema; No deformity. Neuro:  Nonfocal. Psych: Alert and oriented x 3. Normal affect.  Labs    Chemistry Recent Labs  Lab 04/22/23 0432 04/23/23 0439 04/24/23 0447  NA 134* 136 138  K 4.5 4.3 4.7  CL 104 103 102  CO2 19* 25 25  GLUCOSE 156* 138* 187*  BUN 19 21* 23*  CREATININE 2.33* 2.53* 2.56*  CALCIUM 8.2* 8.3* 8.5*  ALBUMIN 3.0* 2.8* 2.9*  GFRNONAA 23* 21* 21*  ANIONGAP 11 8 11      Hematology Recent Labs  Lab 04/22/23 0432 04/23/23 0439 04/24/23 0447  WBC 5.6 3.9* 4.3  RBC 3.29* 3.12* 3.36*  HGB 9.0* 8.4* 9.0*  HCT 28.1* 27.4* 29.4*  MCV 85.4 87.8 87.5  MCH 27.4 26.9 26.8  MCHC 32.0 30.7 30.6  RDW 18.1* 18.3* 17.6*  PLT 144* 139* 155    Cardiac Enzymes Recent Labs  Lab 03/30/23 1757 03/30/23 1934 04/15/23 2158 04/15/23 2342 04/22/23 0432  TROPONINIHS 4 5 5 6 4     BNP Recent Labs  Lab 04/21/23 1256 04/22/23 0432  BNP 183.0* 297.0*     DDimerNo results for input(s): "DDIMER" in the last 168 hours.   Radiology    CT RENAL STONE STUDY  Result Date: 04/22/2023 CLINICAL DATA:  Left flank pain, nausea EXAM: CT ABDOMEN AND PELVIS WITHOUT CONTRAST TECHNIQUE: Multidetector CT imaging of the abdomen and pelvis was performed following the standard protocol without IV contrast. RADIATION DOSE REDUCTION: This exam was performed according to the departmental dose-optimization program which includes automated exposure control, adjustment of the mA and/or kV according to patient size and/or use of iterative reconstruction technique. COMPARISON:  04/18/2023 FINDINGS: Lower chest: Interval development of bibasilar peribronchovascular ground-glass pulmonary infiltrates likely related to multifocal infection or noncardiogenic pulmonary edema. Trace bilateral pleural effusions. Extensive multi-vessel coronary artery  calcification. Global cardiac size within normal limits. Hepatobiliary: No focal liver abnormality is seen. Status post cholecystectomy. No biliary dilatation. Pancreas: Unremarkable. No pancreatic ductal dilatation or surrounding inflammatory changes. Spleen: Mild splenomegaly is stable with the spleen measuring 15.6 cm in greatest dimension. This appears slightly progressive since remote prior examination 10/07/2022. No intrasplenic lesions identified on this noncontrast examination. Adrenals/Urinary Tract: The adrenal glands are unremarkable. The kidneys are normal in size and position. 1-2 mm punctate nonobstructing calculi are noted within the interpolar and lower polar regions of the kidneys bilaterally. No hydronephrosis. Minimal nonspecific bilateral perinephric stranding. No perinephric fluid collections. No hydronephrosis. No ureteral calculi. The bladder is unremarkable. Stomach/Bowel: Moderate colonic stool burden without evidence of obstruction. Status post subtotal appendectomy. The stomach, small bowel, and large bowel are otherwise unremarkable. No evidence of obstruction or focal inflammation. No free intraperitoneal gas or fluid. Vascular/Lymphatic: Aortic atherosclerosis. No enlarged abdominal or pelvic lymph nodes. Reproductive: Status post hysterectomy. No adnexal masses. Other: No abdominal wall hernia. Musculoskeletal: Osseous structures are age-appropriate. No acute bone abnormality. No lytic or blastic bone lesion. IMPRESSION: 1. Interval development of bibasilar peribronchovascular ground-glass pulmonary infiltrates likely related to multifocal infection or noncardiogenic pulmonary edema. Trace bilateral pleural effusions. 2. Extensive multi-vessel coronary artery calcification. 3. Mild splenomegaly, progressive since remote prior examination of 10/07/2022. 4. Minimal bilateral nonobstructing nephrolithiasis. No urolithiasis. No hydronephrosis. 5. Moderate colonic stool burden without  evidence of obstruction. 6. Aortic atherosclerosis. Aortic Atherosclerosis (ICD10-I70.0). Electronically Signed   By: Helyn Numbers M.D.   On: 04/22/2023 19:45   ECHOCARDIOGRAM LIMITED  Result Date: 04/22/2023    ECHOCARDIOGRAM LIMITED REPORT   Patient Name:   [name] Hans[place] Date of Exam: 04/22/2023 Medical Rec #:  [id number]      Height:       63.0 in Accession #:    [id number]     Weight:       245.0 lb Date of Birth:  07/19/1961      BSA:          2.108 m Patient Age:    61 years       BP:           117/62 mmHg Patient Gender: F              HR:           67 bpm. [facility] Procedure: Limited Echo, Cardiac Doppler and Color Doppler Indications:    CHF                 IVC check  History:        Patient has prior history of Echocardiogram examinations, most  recent 04/17/2023. CHF, CAD and Angina, Arrythmias:Atrial                 Fibrillation, Signs/Symptoms:Chest Pain; Risk                 Factors:Hypertension, Diabetes and Dyslipidemia. CKD.  Sonographer:    Mikki Harbor Referring Phys: [id number]  P   Sonographer Comments: Patient is obese. IMPRESSIONS  1. Limited Echo for IVC check.  2. Left ventricular ejection fraction, by estimation, is 60 to 65%. The left ventricle has normal function. Left ventricular diastolic function could not be evaluated.  3. Right ventricular systolic function is normal. The right ventricular size is normal. There is normal pulmonary artery systolic pressure.  4. The inferior vena cava is normal in size with <50% respiratory variability, suggesting right atrial pressure of 8 mmHg. FINDINGS  Left Ventricle: Left ventricular ejection fraction, by estimation, is 60 to 65%. The left ventricle has normal function. The left ventricular internal cavity size was normal in size. There is no left ventricular hypertrophy. Left ventricular diastolic function could not be evaluated. Right Ventricle: The right ventricular size is normal. Right  ventricular systolic function is normal. There is normal pulmonary artery systolic pressure. The tricuspid regurgitant velocity is 2.52 m/s, and with an assumed right atrial pressure of 8 mmHg,  the estimated right ventricular systolic pressure is 33.4 mmHg. Left Atrium: Left atrial size was not assessed. Right Atrium: Right atrial size was not assessed. Pericardium: There is no evidence of pericardial effusion. Mitral Valve: The mitral valve is normal in structure. Tricuspid Valve: The tricuspid valve is normal in structure. Tricuspid valve regurgitation is trivial. Aortic Valve: The aortic valve is tricuspid. There is mild calcification of the aortic valve. There is mild thickening of the aortic valve. There is mild aortic valve annular calcification. Pulmonic Valve: The pulmonic valve was normal in structure. Aorta: The aortic root is normal in size and structure. Venous: The inferior vena cava is normal in size with less than 50% respiratory variability, suggesting right atrial pressure of 8 mmHg. IAS/Shunts: The interatrial septum was not assessed. 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 Vallery Sa reached out to [place] regarding patient's ongoing chest pains at rest and exertion, and is concerned that patient is high risk for sudden cardiac death.  EKG and troponins within normal limits around 2 days ago, will repeat  today. Has been on heparin drip for more than 1 week.  I do not see any benefit in continuing the heparin drip anymore.  Will continue maintenance dose of aspirin, Plavix and high intensity statin.  Normal echo on admission.  Serum creatinine uptrending since admission, following nephrology recommendations. Nephrology thinks this could be progressive CKD. Currently on IV fluids according to nephrology recommendations. Will transfer to [facility] today to post for LHC probably tomorrow if serum creatinine starts to downtrend.  Patient verbalized understanding of the risk of progressive CKD and the risk of dialysis if she undergoes LHC.  Will also post for RHC to assess volume status. -Risks and benefits of cardiac catheterization including RHC and LHC have been discussed with the patient.  These include bleeding, infection, kidney damage, stroke, heart attack, death.  The patient understands these risks and is willing to proceed.  # Chronic anemia -Received 1 unit PRBC this admission.  Keep hemoglobin more than 8.    Signed, Marjo Bicker, MD  04/24/2023, 12:48 PM

## 2023-04-24 NOTE — Plan of Care (Signed)
  Problem: Education: Goal: Ability to describe self-care measures that may prevent or decrease complications (Diabetes Survival Skills Education) will improve Outcome: Progressing   Problem: Nutritional: Goal: Maintenance of adequate nutrition will improve Outcome: Progressing   Problem: Education: Goal: Knowledge of General Education information will improve Description: Including pain rating scale, medication(s)/side effects and non-pharmacologic comfort measures Outcome: Progressing   Problem: Clinical Measurements: Goal: Will remain free from infection Outcome: Progressing   Problem: Nutrition: Goal: Adequate nutrition will be maintained Outcome: Progressing   Problem: Pain Managment: Goal: General experience of comfort will improve Outcome: Progressing

## 2023-04-24 NOTE — Plan of Care (Signed)
  Problem: Coping: Goal: Ability to adjust to condition or change in health will improve Outcome: Progressing   Problem: Metabolic: Goal: Ability to maintain appropriate glucose levels will improve Outcome: Progressing   Problem: Skin Integrity: Goal: Risk for impaired skin integrity will decrease Outcome: Progressing   Problem: Activity: Goal: Risk for activity intolerance will decrease Outcome: Progressing   Problem: Coping: Goal: Level of anxiety will decrease Outcome: Progressing   Problem: Elimination: Goal: Will not experience complications related to bowel motility Outcome: Progressing

## 2023-04-24 NOTE — Progress Notes (Signed)
[facility] Progress Note    Assessment/ Plan:    AKI on CKD3 -Baseline creatinine typically around 1.1-1.4.  Setting of concern for ischemic insults/cardiac event and it appears that she has had progression of her CKD.  Normal saline was stopped and transitioned to LR per charting. Her CT renal stone does not show a definitive cause for her AKI, no obstruction, punctate stones are likely not the cause. UA is bland. Cr up to 2.5 despite fluids.  CK ok.  ----------- - Check post-void residual bladder scan and place foley if retention  - she remains nonoliguric fortunately - Check urine protein/cr ratio  - Defer fluids - Will also pursue duplex study (will need to be done at [facility] per charting) - Strict ins/outs are ordered  - I am concerned this is her new baseline   Chest pain -ongoing - per cardiology. Note known CAD with hx of stent - timing of heart cath per cardiology discretion - she continues to have chest pain.  I have messaged the primary team and have spoken with cardiology re: same.  She may be at her new baseline Creatinine unfortunately.  Heart cath does carry risk of CKD progression and further AKI however also risks of untreated CAD   HTN - Increase imdur to 60 mg daily   CKD stage 3 - note baseline Cr 1.1 - 1.4  Normocytic Anemia -Transfuse for Hgb<8 g/dL. Note on heparin gtt - work up per primary team.  Note that she has a self described history of GIB in the past per charting   Uncontrolled Diabetes Mellitus Type 2 with Hyperglycemia -per primary team    Abdominal pain -s/p ct renal 8/6:  interval development of bibasilar peribronchovascular ground glass infiltrates (multifocal pneumonia or pulmonary edema), trace b/l pleural effusions, extensive multivessel coronary artery calcification, progressive splenomegaly, minimal bilateral nonobstructive nephrolithiasis (no obstruction), moderate colonic stool burden without obstruction, aortic  atherosclerosis  Shortness of breath  -she does report a sick contact at home (grandson who has a 'cold'). CT renal stone with possible pneumonia vs edema? infectious work up per primary service.  Note that covid and resp viral panel were negative.   Would continue inpatient monitoring    Subjective:   She had 1.3 liters UOP over 8/7 as well as one unmeasured urine void.  She and I met today.  She doesn't feel good.  She has had diabetes for 50 years and states has essentially been uncontrolled most or all of that time.  Had a heart cath and stent last year.    Review of systems:  Chest pain which woke her from sleep this AM - 7/8 She has shortness of breath with exertion  She reports nausea this AM        Objective:   BP (!) 144/96 (BP Location: Left Arm)   Pulse 78   Temp 97.8 F (36.6 C) (Oral)   Resp 18   Ht 5\' 3"  (1.6 m)   Wt 115.9 kg   SpO2 97%   BMI 45.26 kg/m   Intake/Output Summary (Last 24 hours) at 04/24/2023 1123 Last data filed at 04/24/2023 0032 Gross per 24 hour  Intake 480 ml  Output 1300 ml  Net -820 ml   Weight change:   Physical Exam:  General adult female in bed uncomfortable appearing  HEENT normocephalic atraumatic extraocular movements intact sclera anicteric Neck supple trachea midline Lungs clear to auscultation bilaterally normal work of breathing at rest  Heart S1S2 no  rub Abdomen soft nontender nondistended Extremities no pitting edema  Psych normal mood and affect Neuro - alert and oriented x 3 provides history and follows commands  GU  no foley   Imaging: CT RENAL STONE STUDY  Result Date: 04/22/2023 CLINICAL DATA:  Left flank pain, nausea EXAM: CT ABDOMEN AND PELVIS WITHOUT CONTRAST TECHNIQUE: Multidetector CT imaging of the abdomen and pelvis was performed following the standard protocol without IV contrast. RADIATION DOSE REDUCTION: This exam was performed according to the departmental dose-optimization program which includes  automated exposure control, adjustment of the mA and/or kV according to patient size and/or use of iterative reconstruction technique. COMPARISON:  04/18/2023 FINDINGS: Lower chest: Interval development of bibasilar peribronchovascular ground-glass pulmonary infiltrates likely related to multifocal infection or noncardiogenic pulmonary edema. Trace bilateral pleural effusions. Extensive multi-vessel coronary artery calcification. Global cardiac size within normal limits. Hepatobiliary: No focal liver abnormality is seen. Status post cholecystectomy. No biliary dilatation. Pancreas: Unremarkable. No pancreatic ductal dilatation or surrounding inflammatory changes. Spleen: Mild splenomegaly is stable with the spleen measuring 15.6 cm in greatest dimension. This appears slightly progressive since remote prior examination 10/07/2022. No intrasplenic lesions identified on this noncontrast examination. Adrenals/Urinary Tract: The adrenal glands are unremarkable. The kidneys are normal in size and position. 1-2 mm punctate nonobstructing calculi are noted within the interpolar and lower polar regions of the kidneys bilaterally. No hydronephrosis. Minimal nonspecific bilateral perinephric stranding. No perinephric fluid collections. No hydronephrosis. No ureteral calculi. The bladder is unremarkable. Stomach/Bowel: Moderate colonic stool burden without evidence of obstruction. Status post subtotal appendectomy. The stomach, small bowel, and large bowel are otherwise unremarkable. No evidence of obstruction or focal inflammation. No free intraperitoneal gas or fluid. Vascular/Lymphatic: Aortic atherosclerosis. No enlarged abdominal or pelvic lymph nodes. Reproductive: Status post hysterectomy. No adnexal masses. Other: No abdominal wall hernia. Musculoskeletal: Osseous structures are age-appropriate. No acute bone abnormality. No lytic or blastic bone lesion. IMPRESSION: 1. Interval development of bibasilar  peribronchovascular ground-glass pulmonary infiltrates likely related to multifocal infection or noncardiogenic pulmonary edema. Trace bilateral pleural effusions. 2. Extensive multi-vessel coronary artery calcification. 3. Mild splenomegaly, progressive since remote prior examination of 10/07/2022. 4. Minimal bilateral nonobstructing nephrolithiasis. No urolithiasis. No hydronephrosis. 5. Moderate colonic stool burden without evidence of obstruction. 6. Aortic atherosclerosis. Aortic Atherosclerosis (ICD10-I70.0). Electronically Signed   By: Helyn Numbers M.D.   On: 04/22/2023 19:45   ECHOCARDIOGRAM LIMITED  Result Date: 04/22/2023    ECHOCARDIOGRAM LIMITED REPORT   Patient Name:   [name] Date of Exam: 04/22/2023 Medical Rec #:  [id number]      Height:       63.0 in Accession #:    [id number]     Weight:       245.0 lb Date of Birth:  1961-07-25      BSA:          2.108 m Patient Age:    61 years       BP:           117/62 mmHg Patient Gender: F              HR:           67 bpm. [facility] Procedure: Limited Echo, Cardiac Doppler and Color Doppler Indications:    CHF                 IVC check  History:        Patient has prior history of Echocardiogram examinations,  most                 recent 04/17/2023. CHF, CAD and Angina, Arrythmias:Atrial                 Fibrillation, Signs/Symptoms:Chest Pain; Risk                 Factors:Hypertension, Diabetes and Dyslipidemia. CKD.  Sonographer:    Mikki Harbor Referring Phys: [id number] VISHNU P MALLIPEDDI  Sonographer Comments: Patient is obese. IMPRESSIONS  1. Limited Echo for IVC check.  2. Left ventricular ejection fraction, by estimation, is 60 to 65%. The left ventricle has normal function. Left ventricular diastolic function could not be evaluated.  3. Right ventricular systolic function is normal. The right ventricular size is normal. There is normal pulmonary artery systolic pressure.  4. The inferior vena cava is normal in size with <50%  respiratory variability, suggesting right atrial pressure of 8 mmHg. FINDINGS  Left Ventricle: Left ventricular ejection fraction, by estimation, is 60 to 65%. The left ventricle has normal function. The left ventricular internal cavity size was normal in size. There is no left ventricular hypertrophy. Left ventricular diastolic function could not be evaluated. Right Ventricle: The right ventricular size is normal. Right ventricular systolic function is normal. There is normal pulmonary artery systolic pressure. The tricuspid regurgitant velocity is 2.52 m/s, and with an assumed right atrial pressure of 8 mmHg,  the estimated right ventricular systolic pressure is 33.4 mmHg. Left Atrium: Left atrial size was not assessed. Right Atrium: Right atrial size was not assessed. Pericardium: There is no evidence of pericardial effusion. Mitral Valve: The mitral valve is normal in structure. Tricuspid Valve: The tricuspid valve is normal in structure. Tricuspid valve regurgitation is trivial. Aortic Valve: The aortic valve is tricuspid. There is mild calcification of the aortic valve. There is mild thickening of the aortic valve. There is mild aortic valve annular calcification. Pulmonic Valve: The pulmonic valve was normal in structure. Aorta: The aortic root is normal in size and structure. Venous: The inferior vena cava is normal in size with less than 50% respiratory variability, suggesting right atrial pressure of 8 mmHg. IAS/Shunts: The interatrial septum was not assessed. LEFT VENTRICLE PLAX 2D LVIDd:         4.70 cm LVIDs:         3.00 cm LV PW:         1.20 cm LV IVS:        1.10 cm LVOT diam:     2.10 cm LVOT Area:     3.46 cm  LEFT ATRIUM         Index LA diam:    4.40 cm 2.09 cm/m   AORTA Ao Root diam: 3.10 cm TRICUSPID VALVE TR Peak grad:   25.4 mmHg TR Vmax:        252.00 cm/s  SHUNTS Systemic Diam: 2.10 cm Vishnu Priya Mallipeddi Electronically signed by Winfield Rast Mallipeddi Signature Date/Time:  04/22/2023/5:07:38 PM    Final     Labs: BMET Recent Labs  Lab 04/18/23 4540 04/19/23 0448 04/20/23 0455 04/21/23 0449 04/22/23 0432 04/23/23 0439 04/24/23 0447  NA 135 136 137 136 134* 136 138  K 4.1 4.4 4.3 4.3 4.5 4.3 4.7  CL 105 107 108 111 104 103 102  CO2 23 19* 23 21* 19* 25 25  GLUCOSE 201* 200* 147* 129* 156* 138* 187*  BUN 20 19 20 18 19  21* 23*  CREATININE 1.96* 2.07* 2.17* 2.25*  2.33* 2.53* 2.56*  CALCIUM 7.7* 8.0* 7.8* 7.7* 8.2* 8.3* 8.5*  PHOS  --   --   --   --  3.6 4.2 4.6   CBC Recent Labs  Lab 04/21/23 0449 04/22/23 0432 04/23/23 0439 04/24/23 0447  WBC 3.9* 5.6 3.9* 4.3  HGB 7.6* 9.0* 8.4* 9.0*  HCT 25.0* 28.1* 27.4* 29.4*  MCV 89.0 85.4 87.8 87.5  PLT 145* 144* 139* 155    Medications:     aspirin EC  81 mg Oral Daily   atorvastatin  80 mg Oral q morning   carvedilol  12.5 mg Oral BID WC   clonazePAM  0.5 mg Oral TID   clopidogrel  75 mg Oral Daily   gabapentin  300 mg Oral QHS   insulin aspart  0-15 Units Subcutaneous TID WC   insulin aspart  14 Units Subcutaneous TID WC   insulin detemir  50 Units Subcutaneous BID   isosorbide mononitrate  30 mg Oral q morning   lamoTRIgine  100 mg Oral BID   multivitamin with minerals  1 tablet Oral Daily   polyethylene glycol  17 g Oral BID   senna-docusate  2 tablet Oral QHS   sertraline  100 mg Oral Daily      Estanislado Emms, MD [facility] 04/24/2023, 12:14 PM

## 2023-04-24 NOTE — Progress Notes (Addendum)
PROGRESS NOTE  Daliah Aikin WUJ:[id number] DOB: 1961-07-07 DOA: 04/15/2023 PCP: Patient, No Pcp Per  Brief History:  62 y.o. female with medical history significant of CAD s/p PCI, hypertension, hyperlipidemia, T2DM, DVT on Eliquis who presents to the emergency department with complaints of chest pain.  Patient complained of left-sided chest pain which occurred yesterday while sitting in a car chest pain was described as sharp and nonreproducible and was rated as 8/10 on pain scale with radiation to the back, this was associated with dizziness and nausea without vomiting.  She states that she called her PCP who advised her to go to the ED for further evaluation and management.  Patient denies shortness of breath, diaphoresis, fever.  The patient's troponins have been unremarkable.  Echocardiogram on 04/22/2023 showed EF 60 to 65%, trivial MR, normal RV EF.  However, patient continued to have intermittent chest pain throughout her hospitalization.  Her hospitalization was prolonged due to development of acute on chronic renal failure with serum creatinine up to 2.56.  Nephrology was consulted.  The patient was started on IV heparin for her persistent chest pain.  She received IV heparin for about a week and it was ultimately discontinued on 04/24/2023.  After discussion with nephrology, it was felt that the patient likely may have new renal baseline.  After discussion with the patient of the risk, benefits, and alternatives, all parties involved agreed to pursue left heart catheterization.   Assessment/Plan: Chest pain with high risk for cardiac etiology/unstable angina - appreciate cardiology team consultation - continue supportive measures, IV heparin infusion and high dose statin  - working to improve creatinine in case cath is approved - cardio team planning to premedicate for cath given history of contrast allergy -has some atypical features--requiring fentanyl IV pushes for pain -Patient  has received 8 days of intravenous heparin.  This was discontinued on 04/24/2023. -04/22/2023 echo EF 60 to 65%, normal RV EF, trivial TR. -After discussion with nephrology and the patient, it was decided to go forward with left heart catheterization.   AKI on CKD stage 3b  -Baseline creatinine 1.2-1.4 -Serum creatinine peaking 2.53 - continue IV fluids per renal - recheck renal panel - renal US done 8/3 with normal findings reported  - urinalysis reassuring - appreciate nephrology consultation to assist with getting medically optimized for cath - CT stone study--negative hydronephrosis, bibasilar GGO--infx vs edema; trace bilateral pleural effusion, moderate stool burden -8/8--discussed with Dr. Nicolette Bang may have new renal baseline  -Patient agrees to move forward with left heart catheterization with the understanding of worsening renal function.  pulmonary infiltrates -viral respiratory panel--negative -check COVID negative -check PCT < 0.10   Anemia in CKD  - Hg down to 7.7 with hydration - with her CAD, cardiology team want her Hg>8 - transfused 1 unit PRBC on 8/5 - Hg up to 9 after transfusion - follow   - hemoccult stools     RLQ abdominal pain - intermittent, lipase test came back normal - pt reports appendectomy and cholecystectomy - CT abd/pelvis without contrast ordered (due to contrast allergy and upcoming cath) - 8/2 CT AP with findings of hepatomegaly and steatosis of liver, no acute findings to explain abd pain - 8/6 CT renal protocol--negative hydronephrosis, bibasilar GGO--infx vs edema; trace bilateral pleural effusion, moderate stool burden  CAD -continue ASA/plavix, statin -s/p RCA PCI in [place] 2022   Chronic constipation  - pt reports having diabetic neuropathy and chronic  problems with constipation  - pt has been on regular laxatives and has had a bowel movement on 8/5   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 - increased prandial novolog  to 14 units TID with meals - continue home detemir 50 units BID  - 04/15/23 A1C--9.1    Chronic atrial fibrillation - carvedilol for heart rate control - holding apixaban in anticipation for possible cath later this week  - IV heparin for anticoagulation>>stopped 8/8   Hyperlipidemia - atorvastatin 80 mg daily    Essential Hypertension  - resumed home carvedilol with good BP control    Morbid Obesity -BMI 45.26 -lifestyle modification       Family Communication:   no Family at bedside   Consultants:  renal, cards   Code Status:  FULL   DVT Prophylaxis:  IV Heparin     Procedures: As Listed in Progress Note Above   Antibiotics: None            Subjective: Patient continues to have intermittent chest discomfort.  She states that this occurs with rest and exertion.  She denies any worsening shortness of breath.  She denies any vomiting or dark abdominal pain.  She has some nausea.  She denies any fevers or chills.  There is no dysuria, hematuria, hematochezia, melena.  Objective: Vitals:   04/23/23 2038 04/23/23 2111 04/24/23 0426 04/24/23 1311  BP: (!) 152/51 (!) 144/96  (!) 142/75  Pulse: 73 78  75  Resp: 18 18  18   Temp: 98.5 F (36.9 C) 97.8 F (36.6 C)  98 F (36.7 C)  TempSrc: Oral Oral  Oral  SpO2: 98% 97%  97%  Weight:   115.9 kg   Height:        Intake/Output Summary (Last 24 hours) at 04/24/2023 1712 Last data filed at 04/24/2023 1100 Gross per 24 hour  Intake 240 ml  Output 1000 ml  Net -760 ml   Weight change:  Exam:  General:  Pt is alert, follows commands appropriately, not in acute distress HEENT: No icterus, No thrush, No neck mass, [place]/AT Cardiovascular: RRR, S1/S2, no rubs, no gallops Respiratory: CTA bilaterally, no wheezing, no crackles, no rhonchi Abdomen: Soft/+BS, non tender, non distended, no guarding Extremities: No edema, No lymphangitis, No petechiae, No rashes, no synovitis   Data Reviewed: I have personally  reviewed following labs and imaging studies Basic Metabolic Panel: Recent Labs  Lab 04/20/23 0455 04/21/23 0449 04/22/23 0432 04/23/23 0439 04/24/23 0447  NA 137 136 134* 136 138  K 4.3 4.3 4.5 4.3 4.7  CL 108 111 104 103 102  CO2 23 21* 19* 25 25  GLUCOSE 147* 129* 156* 138* 187*  BUN 20 18 19  21* 23*  CREATININE 2.17* 2.25* 2.33* 2.53* 2.56*  CALCIUM 7.8* 7.7* 8.2* 8.3* 8.5*  MG  --  1.8  --   --   --   PHOS  --   --  3.6 4.2 4.6   Liver Function Tests: Recent Labs  Lab 04/22/23 0432 04/23/23 0439 04/24/23 0447  ALBUMIN 3.0* 2.8* 2.9*   Recent Labs  Lab 04/18/23 0948  LIPASE 34   No results for input(s): "AMMONIA" in the last 168 hours. Coagulation Profile: No results for input(s): "INR", "PROTIME" in the last 168 hours. CBC: Recent Labs  Lab 04/20/23 0455 04/21/23 0449 04/22/23 0432 04/23/23 0439 04/24/23 0447  WBC 4.2 3.9* 5.6 3.9* 4.3  HGB 8.2* 7.6* 9.0* 8.4* 9.0*  HCT 27.1* 25.0* 28.1* 27.4* 29.4*  MCV 88.6 89.0 85.4 87.8 87.5  PLT 150 145* 144* 139* 155   Cardiac Enzymes: Recent Labs  Lab 04/23/23 0910  CKTOTAL 68   BNP: Invalid input(s): "POCBNP" CBG: Recent Labs  Lab 04/23/23 2033 04/24/23 0456 04/24/23 0725 04/24/23 1138 04/24/23 1601  GLUCAP 185* 177* 171* 197* 208*   HbA1C: No results for input(s): "HGBA1C" in the last 72 hours. Urine analysis:    Component Value Date/Time   COLORURINE YELLOW 04/21/2023 0726   APPEARANCEUR CLOUDY (A) 04/21/2023 0726   LABSPEC 1.006 04/21/2023 0726   PHURINE 5.0 04/21/2023 0726   GLUCOSEU NEGATIVE 04/21/2023 0726   HGBUR NEGATIVE 04/21/2023 0726   BILIRUBINUR NEGATIVE 04/21/2023 0726   KETONESUR NEGATIVE 04/21/2023 0726   PROTEINUR NEGATIVE 04/21/2023 0726   UROBILINOGEN 0.2 12/17/2011 2258   NITRITE NEGATIVE 04/21/2023 0726   LEUKOCYTESUR NEGATIVE 04/21/2023 0726   Sepsis Labs: @LABRCNTIP (procalcitonin:4,lacticidven:4) ) Recent Results (from the past 240 hour(s))  Respiratory (~20  pathogens) panel by PCR     Status: None   Collection Time: 04/23/23  3:13 AM   Specimen: Nasopharyngeal Swab; Respiratory  Result Value Ref Range Status   Adenovirus NOT DETECTED NOT DETECTED Final   Coronavirus 229E NOT DETECTED NOT DETECTED Final    Comment: (NOTE) The Coronavirus on the Respiratory Panel, DOES NOT test for the novel  Coronavirus (2019 nCoV)    Coronavirus HKU1 NOT DETECTED NOT DETECTED Final   Coronavirus NL63 NOT DETECTED NOT DETECTED Final   Coronavirus OC43 NOT DETECTED NOT DETECTED Final   Metapneumovirus NOT DETECTED NOT DETECTED Final   Rhinovirus / Enterovirus NOT DETECTED NOT DETECTED Final   Influenza A NOT DETECTED NOT DETECTED Final   Influenza B NOT DETECTED NOT DETECTED Final   Parainfluenza Virus 1 NOT DETECTED NOT DETECTED Final   Parainfluenza Virus 2 NOT DETECTED NOT DETECTED Final   Parainfluenza Virus 3 NOT DETECTED NOT DETECTED Final   Parainfluenza Virus 4 NOT DETECTED NOT DETECTED Final   Respiratory Syncytial Virus NOT DETECTED NOT DETECTED Final   Bordetella pertussis NOT DETECTED NOT DETECTED Final   Bordetella Parapertussis NOT DETECTED NOT DETECTED Final   Chlamydophila pneumoniae NOT DETECTED NOT DETECTED Final   Mycoplasma pneumoniae NOT DETECTED NOT DETECTED Final    Comment: Performed at [facility], [address], [place], [place] [address]  SARS Coronavirus 2 by RT PCR (hospital order, performed in [facility] hospital lab) *cepheid single result test* Anterior Nasal Swab     Status: None   Collection Time: 04/23/23  5:31 PM   Specimen: Anterior Nasal Swab  Result Value Ref Range Status   SARS Coronavirus 2 by RT PCR NEGATIVE NEGATIVE Final    Comment: (NOTE) SARS-CoV-2 target nucleic acids are NOT DETECTED.  The SARS-CoV-2 RNA is generally detectable in upper and lower respiratory specimens during the acute phase of infection. The lowest concentration of SARS-CoV-2 viral copies this assay can detect is 250 copies /  mL. A negative result does not preclude SARS-CoV-2 infection and should not be used as the sole basis for treatment or other patient management decisions.  A negative result may occur with improper specimen collection / handling, submission of specimen other than nasopharyngeal swab, presence of viral mutation(s) within the areas targeted by this assay, and inadequate number of viral copies (<250 copies / mL). A negative result must be combined with clinical observations, patient history, and epidemiological information.  Fact Sheet for Patients:   RoadLapTop.co.za  Fact Sheet for Healthcare Providers: http://kim-miller.com/  This test is not yet approved or  cleared by the [place] and has been authorized for detection and/or diagnosis of SARS-CoV-2 by FDA under an Emergency Use Authorization (EUA).  This EUA will remain in effect (meaning this test can be used) for the duration of the COVID-19 declaration under Section 564(b)(1) of the Act, 21 U.S.C. section 360bbb-3(b)(1), unless the authorization is terminated or revoked sooner.  Performed at [facility], [address], [place], [place] [address]      Scheduled Meds:  aspirin EC  81 mg Oral Daily   atorvastatin  80 mg Oral q morning   carvedilol  12.5 mg Oral BID WC   clonazePAM  0.5 mg Oral TID   clopidogrel  75 mg Oral Daily   gabapentin  300 mg Oral QHS   insulin aspart  0-15 Units Subcutaneous TID WC   insulin aspart  14 Units Subcutaneous TID WC   insulin detemir  50 Units Subcutaneous BID   [START ON 04/25/2023] isosorbide mononitrate  60 mg Oral q morning   lamoTRIgine  100 mg Oral BID   multivitamin with minerals  1 tablet Oral Daily   polyethylene glycol  17 g Oral BID   senna-docusate  2 tablet Oral QHS   sertraline  100 mg Oral Daily   Continuous Infusions:  Procedures/Studies: CT RENAL STONE STUDY  Result Date: 04/22/2023 CLINICAL DATA:  Left flank  pain, nausea EXAM: CT ABDOMEN AND PELVIS WITHOUT CONTRAST TECHNIQUE: Multidetector CT imaging of the abdomen and pelvis was performed following the standard protocol without IV contrast. RADIATION DOSE REDUCTION: This exam was performed according to the departmental dose-optimization program which includes automated exposure control, adjustment of the mA and/or kV according to patient size and/or use of iterative reconstruction technique. COMPARISON:  04/18/2023 FINDINGS: Lower chest: Interval development of bibasilar peribronchovascular ground-glass pulmonary infiltrates likely related to multifocal infection or noncardiogenic pulmonary edema. Trace bilateral pleural effusions. Extensive multi-vessel coronary artery calcification. Global cardiac size within normal limits. Hepatobiliary: No focal liver abnormality is seen. Status post cholecystectomy. No biliary dilatation. Pancreas: Unremarkable. No pancreatic ductal dilatation or surrounding inflammatory changes. Spleen: Mild splenomegaly is stable with the spleen measuring 15.6 cm in greatest dimension. This appears slightly progressive since remote prior examination 10/07/2022. No intrasplenic lesions identified on this noncontrast examination. Adrenals/Urinary Tract: The adrenal glands are unremarkable. The kidneys are normal in size and position. 1-2 mm punctate nonobstructing calculi are noted within the interpolar and lower polar regions of the kidneys bilaterally. No hydronephrosis. Minimal nonspecific bilateral perinephric stranding. No perinephric fluid collections. No hydronephrosis. No ureteral calculi. The bladder is unremarkable. Stomach/Bowel: Moderate colonic stool burden without evidence of obstruction. Status post subtotal appendectomy. The stomach, small bowel, and large bowel are otherwise unremarkable. No evidence of obstruction or focal inflammation. No free intraperitoneal gas or fluid. Vascular/Lymphatic: Aortic atherosclerosis. No enlarged  abdominal or pelvic lymph nodes. Reproductive: Status post hysterectomy. No adnexal masses. Other: No abdominal wall hernia. Musculoskeletal: Osseous structures are age-appropriate. No acute bone abnormality. No lytic or blastic bone lesion. IMPRESSION: 1. Interval development of bibasilar peribronchovascular ground-glass pulmonary infiltrates likely related to multifocal infection or noncardiogenic pulmonary edema. Trace bilateral pleural effusions. 2. Extensive multi-vessel coronary artery calcification. 3. Mild splenomegaly, progressive since remote prior examination of 10/07/2022. 4. Minimal bilateral nonobstructing nephrolithiasis. No urolithiasis. No hydronephrosis. 5. Moderate colonic stool burden without evidence of obstruction. 6. Aortic atherosclerosis. Aortic Atherosclerosis (ICD10-I70.0). Electronically Signed   By: Helyn Numbers M.D.   On: 04/22/2023  19:45   ECHOCARDIOGRAM LIMITED  Result Date: 04/22/2023    ECHOCARDIOGRAM LIMITED REPORT   Patient Name:   AMOS FRERICKS [place] Date of Exam: 04/22/2023 Medical Rec #:  [id number]      Height:       63.0 in Accession #:    [id number]     Weight:       245.0 lb Date of Birth:  1961/07/25      BSA:          2.108 m Patient Age:    61 years       BP:           117/62 mmHg Patient Gender: F              HR:           67 bpm. [facility] Procedure: Limited Echo, Cardiac Doppler and Color Doppler Indications:    CHF                 IVC check  History:        Patient has prior history of Echocardiogram examinations, most                 recent 04/17/2023. CHF, CAD and Angina, Arrythmias:Atrial                 Fibrillation, Signs/Symptoms:Chest Pain; Risk                 Factors:Hypertension, Diabetes and Dyslipidemia. CKD.  Sonographer:    Mikki Harbor Referring Phys: [id number] VISHNU P MALLIPEDDI  Sonographer Comments: Patient is obese. IMPRESSIONS  1. Limited Echo for IVC check.  2. Left ventricular ejection fraction, by estimation, is 60 to 65%. The  left ventricle has normal function. Left ventricular diastolic function could not be evaluated.  3. Right ventricular systolic function is normal. The right ventricular size is normal. There is normal pulmonary artery systolic pressure.  4. The inferior vena cava is normal in size with <50% respiratory variability, suggesting right atrial pressure of 8 mmHg. FINDINGS  Left Ventricle: Left ventricular ejection fraction, by estimation, is 60 to 65%. The left ventricle has normal function. The left ventricular internal cavity size was normal in size. There is no left ventricular hypertrophy. Left ventricular diastolic function could not be evaluated. Right Ventricle: The right ventricular size is normal. Right ventricular systolic function is normal. There is normal pulmonary artery systolic pressure. The tricuspid regurgitant velocity is 2.52 m/s, and with an assumed right atrial pressure of 8 mmHg,  the estimated right ventricular systolic pressure is 33.4 mmHg. Left Atrium: Left atrial size was not assessed. Right Atrium: Right atrial size was not assessed. Pericardium: There is no evidence of pericardial effusion. Mitral Valve: The mitral valve is normal in structure. Tricuspid Valve: The tricuspid valve is normal in structure. Tricuspid valve regurgitation is trivial. Aortic Valve: The aortic valve is tricuspid. There is mild calcification of the aortic valve. There is mild thickening of the aortic valve. There is mild aortic valve annular calcification. Pulmonic Valve: The pulmonic valve was normal in structure. Aorta: The aortic root is normal in size and structure. Venous: The inferior vena cava is normal in size with less than 50% respiratory variability, suggesting right atrial pressure of 8 mmHg. IAS/Shunts: The interatrial septum was not assessed. LEFT VENTRICLE PLAX 2D LVIDd:         4.70 cm LVIDs:         3.00 cm LV  PW:         1.20 cm LV IVS:        1.10 cm LVOT diam:     2.10 cm LVOT Area:     3.46 cm   LEFT ATRIUM         Index LA diam:    4.40 cm 2.09 cm/m   AORTA Ao Root diam: 3.10 cm TRICUSPID VALVE TR Peak grad:   25.4 mmHg TR Vmax:        252.00 cm/s  SHUNTS Systemic Diam: 2.10 cm Vishnu Priya Mallipeddi Electronically signed by Winfield Rast Mallipeddi Signature Date/Time: 04/22/2023/5:07:38 PM    Final    DG CHEST PORT 1 VIEW  Result Date: 04/21/2023 CLINICAL DATA:  Chest pain EXAM: PORTABLE CHEST 1 VIEW COMPARISON:  04/20/2023 FINDINGS: The heart size and mediastinal contours are within normal limits. Both lungs are clear. The visualized skeletal structures are unremarkable. IMPRESSION: No active disease. Electronically Signed   By: Charlett Nose M.D.   On: 04/21/2023 16:58   DG CHEST PORT 1 VIEW  Result Date: 04/20/2023 CLINICAL DATA:  [id number] Chest pain [id number] EXAM: PORTABLE CHEST - 1 VIEW COMPARISON:  04/15/2023 FINDINGS: Relatively low lung volumes with some crowding of bronchovascular structures centrally. No new infiltrate. No pneumothorax. Heart size and mediastinal contours are within normal limits. No effusion. Visualized bones unremarkable. IMPRESSION: Low lung volumes. No acute findings. Electronically Signed   By: Corlis Leak M.D.   On: 04/20/2023 10:03   US RENAL  Result Date: 04/19/2023 CLINICAL DATA:  Acute on chronic kidney injury EXAM: RENAL / URINARY TRACT ULTRASOUND COMPLETE COMPARISON:  CT abdomen pelvis 04/18/2023 FINDINGS: Right Kidney: Renal measurements: 9.4 x 4.9 x 4.4 cm = volume: 105 mL. Echogenicity within normal limits. No mass or hydronephrosis visualized. Left Kidney: Renal measurements: 11.6 x 5.6 x 5.0 cm = volume: 169 mL. Echogenicity within normal limits. No mass or hydronephrosis visualized. Bladder: Limited evaluation due to to collapsed configuration. Other: Diffuse increased echogenicity of the visualized portions of the hepatic parenchyma are a nonspecific indicator of hepatocellular dysfunction, most commonly steatosis. IMPRESSION: No significant sonographic  abnormality of the kidneys. Electronically Signed   By: Acquanetta Belling M.D.   On: 04/19/2023 10:55   CT ABDOMEN PELVIS WO CONTRAST  Result Date: 04/18/2023 CLINICAL DATA:  Right lower quadrant abdominal pain EXAM: CT ABDOMEN AND PELVIS WITHOUT CONTRAST TECHNIQUE: Multidetector CT imaging of the abdomen and pelvis was performed following the standard protocol without IV contrast. RADIATION DOSE REDUCTION: This exam was performed according to the departmental dose-optimization program which includes automated exposure control, adjustment of the mA and/or kV according to patient size and/or use of iterative reconstruction technique. COMPARISON:  None Available. FINDINGS: Lower chest: No acute findings. Coronary artery and aortic calcifications. Hepatobiliary: Prior cholecystectomy. Diffuse fatty infiltration of the liver. No focal hepatic abnormality. Mild hepatomegaly with craniocaudal length 23 cm. Pancreas: No focal abnormality or ductal dilatation. Spleen: Splenomegaly with a craniocaudal length of 14.5 cm. This is stable since prior study. No focal abnormality. Adrenals/Urinary Tract: Punctate bilateral nonobstructing renal calculi. No ureteral stones or hydronephrosis. Adrenal glands and urinary bladder unremarkable. Stomach/Bowel: Stomach, large and small bowel grossly unremarkable. Vascular/Lymphatic: Aortic atherosclerosis. No evidence of aneurysm or adenopathy. Reproductive: Prior hysterectomy.  No adnexal masses. Other: No free fluid or free air. Musculoskeletal: No acute bony abnormality. IMPRESSION: Hepatic steatosis.  Hepatosplenomegaly. No acute findings in the abdomen or pelvis. Aortic atherosclerosis, coronary artery disease. Punctate bilateral nephrolithiasis.  No hydronephrosis. Electronically  Signed   By: Charlett Nose M.D.   On: 04/18/2023 19:22   ECHOCARDIOGRAM COMPLETE  Result Date: 04/17/2023    ECHOCARDIOGRAM REPORT   Patient Name:   TENIAH AGNER Pacey Date of Exam: 04/17/2023 Medical Rec #:   [id number]      Height:       63.0 in Accession #:    [id number]     Weight:       245.0 lb Date of Birth:  [birth year]      BSA:          2.108 m Patient Age:    61 years       BP:           136/73 mmHg Patient Gender: F              HR:           71 bpm. [facility] Procedure: 2D Echo, Cardiac Doppler and Color Doppler Indications:    Chest Pain R07.9  History:        Patient has no prior history of Echocardiogram examinations.                 CAD, Arrythmias:Atrial Fibrillation, Signs/Symptoms:Chest Pain;                 Risk Factors:Dyslipidemia, Hypertension and Diabetes.  Sonographer:    Aron Baba Referring Phys: [id number] Ellsworth Lennox  Sonographer Comments: Patient is obese. IMPRESSIONS  1. Left ventricular ejection fraction, by estimation, is 55 to 60%. The left ventricle has normal function. The left ventricle has no regional wall motion abnormalities. Left ventricular diastolic parameters are consistent with Grade I diastolic dysfunction (impaired relaxation).  2. Right ventricular systolic function is normal. The right ventricular size is normal. Tricuspid regurgitation signal is inadequate for assessing PA pressure.  3. The mitral valve is abnormal. Mild mitral valve regurgitation. No evidence of mitral stenosis.  4. The aortic valve is tricuspid. Aortic valve regurgitation is not visualized. No aortic stenosis is present.  5. Aortic dilatation noted. 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  6. The inferior vena cava is normal in size with greater than 50% respiratory variability, suggesting right atrial pressure of 3 mmHg. FINDINGS  Left Ventricle: Left ventricular ejection fraction, by estimation, is 55 to 60%. The left ventricle has normal function. The left ventricle has no regional wall motion abnormalities. The left ventricular internal cavity size was normal in size. There is  no left ventricular hypertrophy. Left ventricular diastolic parameters are  consistent with Grade I diastolic dysfunction (impaired relaxation). Normal left ventricular filling pressure. Right Ventricle: The right ventricular size is normal. Right vetricular wall thickness was not well visualized. Right ventricular systolic function is normal. Tricuspid regurgitation signal is inadequate for assessing PA pressure. Left Atrium: Left atrial size was normal in size. Right Atrium: Right atrial size was normal in size. Pericardium: There is no evidence of pericardial effusion. Mitral Valve: The mitral valve is abnormal. Mild mitral valve regurgitation. No evidence of mitral valve stenosis. Tricuspid Valve: The tricuspid valve is normal in structure. Tricuspid valve regurgitation is trivial. No evidence of tricuspid stenosis. Aortic Valve: The aortic valve is tricuspid. Aortic valve regurgitation is not visualized. No aortic stenosis is present. Aortic valve mean gradient measures 5.4 mmHg. Aortic valve peak gradient measures 8.7 mmHg. Aortic valve area, by VTI measures 1.85 cm. Pulmonic Valve: The pulmonic valve was not well visualized. Pulmonic valve regurgitation  is not visualized. No evidence of pulmonic stenosis. Aorta: The aortic root is normal in size and structure and aortic dilatation noted. 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 Venous: The inferior vena cava is normal in size with greater than 50% respiratory variability, suggesting right atrial pressure of 3 mmHg. IAS/Shunts: No atrial level shunt detected by color flow Doppler.  LEFT VENTRICLE PLAX 2D LVIDd:         5.00 cm   Diastology LVIDs:         3.60 cm   LV e' medial:    13.20 cm/s LV PW:         0.90 cm   LV E/e' medial:  5.8 LV IVS:        0.80 cm   LV e' lateral:   6.22 cm/s LVOT diam:     2.00 cm   LV E/e' lateral: 12.3 LV SV:         67 LV SV Index:   32 LVOT Area:     3.14 cm  RIGHT VENTRICLE RV S prime:     12.60 cm/s TAPSE (M-mode): 2.2 cm LEFT ATRIUM             Index        RIGHT ATRIUM            Index LA diam:        4.00 cm 1.90 cm/m   RA Area:     10.10 cm LA Vol (A2C):   69.3 ml 32.88 ml/m  RA Volume:   20.40 ml  9.68 ml/m LA Vol (A4C):   65.9 ml 31.26 ml/m LA Biplane Vol: 70.8 ml 33.59 ml/m  AORTIC VALVE AV Area (Vmax):    1.94 cm AV Area (Vmean):   1.69 cm AV Area (VTI):     1.85 cm AV Vmax:           147.81 cm/s AV Vmean:          109.081 cm/s AV VTI:            0.360 m AV Peak Grad:      8.7 mmHg AV Mean Grad:      5.4 mmHg LVOT Vmax:         91.34 cm/s LVOT Vmean:        58.622 cm/s LVOT VTI:          0.212 m LVOT/AV VTI ratio: 0.59  AORTA Ao Root diam: 3.40 cm Ao Asc diam:  3.60 cm MITRAL VALVE MV Area (PHT): 4.71 cm     SHUNTS MV Decel Time: 161 msec     Systemic VTI:  0.21 m MR Peak grad: 82.2 mmHg     Systemic Diam: 2.00 cm MR Vmax:      453.33 cm/s MV E velocity: 76.60 cm/s MV A velocity: 108.00 cm/s MV E/A ratio:  0.71 Dina Rich MD Electronically signed by Dina Rich MD Signature Date/Time: 04/17/2023/1:33:26 PM    Final    CT CHEST ABDOMEN PELVIS WO CONTRAST  Result Date: 04/16/2023 CLINICAL DATA:  Sharp chest pain radiating to back. Left-sided rib pain and right-sided abdominal pain. EXAM: CT CHEST, ABDOMEN AND PELVIS WITHOUT CONTRAST TECHNIQUE: Multidetector CT imaging of the chest, abdomen and pelvis was performed following the standard protocol without IV contrast. RADIATION DOSE REDUCTION: This exam was performed according to the departmental dose-optimization program which includes automated exposure control, adjustment of the mA and/or kV according to patient size and/or  use of iterative reconstruction technique. COMPARISON:  10/07/2022. FINDINGS: CT CHEST FINDINGS Cardiovascular: The heart is normal in size and there is no pericardial effusion. Three-vessel coronary artery calcifications are noted. There is atherosclerotic calcification of the aorta without evidence of aneurysm. The pulmonary trunk is normal in caliber. Mediastinum/Nodes: No mediastinal  or hilar lymphadenopathy. Evaluation of the hila is limited due to lack of IV contrast. Coarse calcification is noted in the left lobe of the thyroid gland. The trachea and esophagus are within normal limits. Lungs/Pleura: Mild atelectasis is present bilaterally. No effusion or pneumothorax. Small calcified granuloma are present bilaterally. Musculoskeletal: Degenerative changes are present in the thoracic spine. No acute fracture is seen. CT ABDOMEN PELVIS FINDINGS Hepatobiliary: No focal liver abnormality is seen. The liver is mildly enlarged. No gallstones, gallbladder wall thickening, or biliary dilatation. Pancreas: Unremarkable. No pancreatic ductal dilatation or surrounding inflammatory changes. Spleen: The spleen is enlarged at 14.1 cm in length. No focal abnormality. Adrenals/Urinary Tract: The adrenal glands are within normal limits. A nonobstructive renal calculus is noted on the right. No hydroureteronephrosis bilaterally. The bladder is unremarkable. Stomach/Bowel: Stomach is within normal limits. Appendix is surgically absent. No evidence of bowel wall thickening, distention, or inflammatory changes. No free air or pneumatosis. Vascular/Lymphatic: Aortic atherosclerosis. No enlarged abdominal or pelvic lymph nodes. Reproductive: Status post hysterectomy. No adnexal masses. Other: No abdominopelvic ascites. Subcutaneous fat stranding is noted in the anterior abdominal wall in the left lower quadrant, unchanged from the prior exam. Musculoskeletal: Degenerative changes are present in the lumbar spine. No acute fracture. IMPRESSION: 1. No acute process in the chest, abdomen, or pelvis. 2. Nonobstructive right renal calculus. 3. Mild hepatosplenomegaly. 4. Subcutaneous fat stranding in the anterior abdominal wall in the left lower quadrant, unchanged from the prior exam, possible cellulitis. 5. Coronary artery calcifications. 6. Aortic atherosclerosis. Electronically Signed   By: Thornell Sartorius M.D.   On:  04/16/2023 00:19   DG Chest 2 View  Result Date: 04/15/2023 CLINICAL DATA:  Left-sided chest pain for 2 days EXAM: CHEST - 2 VIEW COMPARISON:  03/30/2023 FINDINGS: Frontal and lateral views of the chest demonstrate an unremarkable cardiac silhouette. No airspace disease, effusion, or pneumothorax. No acute bony abnormality. IMPRESSION: 1. No acute intrathoracic process. Electronically Signed   By: Sharlet Salina M.D.   On: 04/15/2023 22:33   DG Chest 2 View  Result Date: 03/30/2023 CLINICAL DATA:  Chest pain EXAM: CHEST - 2 VIEW COMPARISON:  02/08/2023 FINDINGS: The heart size and mediastinal contours are within normal limits. Both lungs are clear. The visualized skeletal structures are unremarkable. IMPRESSION: No active cardiopulmonary disease. Electronically Signed   By: Ernie Avena M.D.   On: 03/30/2023 19:01    Catarina Hartshorn, DO  [facility]  If 7PM-7AM, please contact night-coverage [email or web address] Password [email or web address] 04/24/2023, 5:12 PM   LOS: 7 days

## 2023-04-24 NOTE — Progress Notes (Deleted)
   Discussed the patient with Dr. Jenene Slicker and no additional cardiac recommendations at this time. Still not a candidate for cath given her renal function. Management of AKI has been deferred to Nephrology.   Signed, Ellsworth Lennox, PA-C 04/24/2023, 11:25 AM

## 2023-04-25 ENCOUNTER — Inpatient Hospital Stay (HOSPITAL_COMMUNITY): Payer: Medicaid - Out of State

## 2023-04-25 ENCOUNTER — Encounter (HOSPITAL_COMMUNITY)
Admission: EM | Disposition: A | Discharge status: Home / Self Care | Payer: Self-pay | Source: Home / Self Care | Attending: Internal Medicine

## 2023-04-25 DIAGNOSIS — N1832 Chronic kidney disease, stage 3b: Secondary | ICD-10-CM | POA: Diagnosis not present

## 2023-04-25 DIAGNOSIS — R0602 Shortness of breath: Secondary | ICD-10-CM | POA: Diagnosis not present

## 2023-04-25 DIAGNOSIS — R0789 Other chest pain: Secondary | ICD-10-CM | POA: Diagnosis not present

## 2023-04-25 DIAGNOSIS — N179 Acute kidney failure, unspecified: Secondary | ICD-10-CM | POA: Diagnosis not present

## 2023-04-25 HISTORY — PX: RIGHT HEART CATH: CATH118263

## 2023-04-25 LAB — CBC
HCT: 28.8 % — ABNORMAL LOW (ref 36.0–46.0)
Hemoglobin: 9 g/dL — ABNORMAL LOW (ref 12.0–15.0)
MCH: 27.1 pg (ref 26.0–34.0)
MCHC: 31.3 g/dL (ref 30.0–36.0)
MCV: 86.7 fL (ref 80.0–100.0)
Platelets: 151 10*3/uL (ref 150–400)
RBC: 3.32 MIL/uL — ABNORMAL LOW (ref 3.87–5.11)
RDW: 17.6 % — ABNORMAL HIGH (ref 11.5–15.5)
WBC: 4.4 10*3/uL (ref 4.0–10.5)
nRBC: 0 % (ref 0.0–0.2)

## 2023-04-25 LAB — POCT I-STAT EG7
Acid-Base Excess: 1 mmol/L (ref 0.0–2.0)
Acid-Base Excess: 3 mmol/L — ABNORMAL HIGH (ref 0.0–2.0)
Bicarbonate: 26.5 mmol/L (ref 20.0–28.0)
Bicarbonate: 28.7 mmol/L — ABNORMAL HIGH (ref 20.0–28.0)
Calcium, Ion: 1.04 mmol/L — ABNORMAL LOW (ref 1.15–1.40)
Calcium, Ion: 1.21 mmol/L (ref 1.15–1.40)
HCT: 23 % — ABNORMAL LOW (ref 36.0–46.0)
HCT: 26 % — ABNORMAL LOW (ref 36.0–46.0)
Hemoglobin: 7.8 g/dL — ABNORMAL LOW (ref 12.0–15.0)
Hemoglobin: 8.8 g/dL — ABNORMAL LOW (ref 12.0–15.0)
O2 Saturation: 59 %
O2 Saturation: 61 %
Potassium: 3.9 mmol/L (ref 3.5–5.1)
Potassium: 4.3 mmol/L (ref 3.5–5.1)
Sodium: 138 mmol/L (ref 135–145)
Sodium: 142 mmol/L (ref 135–145)
TCO2: 28 mmol/L (ref 22–32)
TCO2: 30 mmol/L (ref 22–32)
pCO2, Ven: 48.5 mmHg (ref 44–60)
pCO2, Ven: 51.8 mmHg (ref 44–60)
pH, Ven: 7.346 (ref 7.25–7.43)
pH, Ven: 7.351 (ref 7.25–7.43)
pO2, Ven: 33 mmHg (ref 32–45)
pO2, Ven: 34 mmHg (ref 32–45)

## 2023-04-25 LAB — GLUCOSE, CAPILLARY
Glucose-Capillary: 100 mg/dL — ABNORMAL HIGH (ref 70–99)
Glucose-Capillary: 119 mg/dL — ABNORMAL HIGH (ref 70–99)
Glucose-Capillary: 155 mg/dL — ABNORMAL HIGH (ref 70–99)
Glucose-Capillary: 173 mg/dL — ABNORMAL HIGH (ref 70–99)
Glucose-Capillary: 183 mg/dL — ABNORMAL HIGH (ref 70–99)
Glucose-Capillary: 175 mg/dL — ABNORMAL HIGH (ref 70–99)

## 2023-04-25 LAB — RENAL FUNCTION PANEL
Albumin: 2.7 g/dL — ABNORMAL LOW (ref 3.5–5.0)
Anion gap: 13 (ref 5–15)
BUN: 22 mg/dL — ABNORMAL HIGH (ref 6–20)
CO2: 25 mmol/L (ref 22–32)
Calcium: 8.9 mg/dL (ref 8.9–10.3)
Chloride: 101 mmol/L (ref 98–111)
Creatinine, Ser: 2.52 mg/dL — ABNORMAL HIGH (ref 0.44–1.00)
GFR, Estimated: 21 mL/min — ABNORMAL LOW (ref >60)
Glucose, Bld: 113 mg/dL — ABNORMAL HIGH (ref 70–99)
Phosphorus: 5.1 mg/dL — ABNORMAL HIGH (ref 2.5–4.6)
Potassium: 4.2 mmol/L (ref 3.5–5.1)
Sodium: 139 mmol/L (ref 135–145)

## 2023-04-25 SURGERY — RIGHT HEART CATH

## 2023-04-25 MED ORDER — NITROGLYCERIN 0.4 MG SL SUBL: 0.4000 mg | SUBLINGUAL_TABLET | SUBLINGUAL | Status: DC | PRN

## 2023-04-25 MED ORDER — SODIUM CHLORIDE 0.9% FLUSH: 3.0000 mL | INTRAVENOUS | Status: DC | PRN

## 2023-04-25 MED ORDER — DARBEPOETIN ALFA 60 MCG/0.3ML IJ SOSY
60.0000 ug | PREFILLED_SYRINGE | INTRAMUSCULAR | Status: DC
  Administered 2023-04-25 – 2023-05-02 (×2): 60 ug via SUBCUTANEOUS
  Filled 2023-04-25 (×2): qty 0.3

## 2023-04-25 MED ORDER — SODIUM CHLORIDE 0.9 % IV SOLN: INTRAVENOUS | Status: DC

## 2023-04-25 MED ORDER — SODIUM CHLORIDE 0.9 % IV SOLN
250.0000 mg | Freq: Every day | INTRAVENOUS | Status: AC
  Administered 2023-04-25 – 2023-04-28 (×4): 250 mg via INTRAVENOUS
  Filled 2023-04-25 (×4): qty 20

## 2023-04-25 MED ORDER — LABETALOL HCL 5 MG/ML IV SOLN: 10.0000 mg | INTRAVENOUS | Status: AC | PRN

## 2023-04-25 MED ORDER — HYDRALAZINE HCL 20 MG/ML IJ SOLN: 10.0000 mg | INTRAMUSCULAR | Status: AC | PRN

## 2023-04-25 MED ORDER — LIDOCAINE HCL (PF) 1 % IJ SOLN
INTRAMUSCULAR | Status: DC | PRN
  Administered 2023-04-25: 5 mL

## 2023-04-25 MED ORDER — SODIUM CHLORIDE 0.9 % IV SOLN: 250.0000 mL | INTRAVENOUS | Status: DC | PRN

## 2023-04-25 MED ORDER — SODIUM CHLORIDE 0.9% FLUSH
3.0000 mL | Freq: Two times a day (BID) | INTRAVENOUS | Status: DC
  Administered 2023-04-25 – 2023-05-02 (×13): 3 mL via INTRAVENOUS

## 2023-04-25 MED ORDER — FUROSEMIDE 10 MG/ML IJ SOLN
80.0000 mg | Freq: Once | INTRAMUSCULAR | Status: AC
  Administered 2023-04-25: 80 mg via INTRAVENOUS
  Filled 2023-04-25: qty 8

## 2023-04-25 MED ORDER — HEPARIN (PORCINE) IN NACL 1000-0.9 UT/500ML-% IV SOLN
INTRAVENOUS | Status: DC | PRN
  Administered 2023-04-25: 500 mL

## 2023-04-25 SURGICAL SUPPLY — 8 items
CATH BALLN WEDGE 5F 110CM (CATHETERS) IMPLANT
PACK CARDIAC CATHETERIZATION (CUSTOM PROCEDURE TRAY) IMPLANT
PROTECTION STATION PRESSURIZED (MISCELLANEOUS) ×1
SHEATH GLIDE SLENDER 4/5FR (SHEATH) IMPLANT
SHEATH PROBE COVER 6X72 (BAG) IMPLANT
STATION PROTECTION PRESSURIZED (MISCELLANEOUS) IMPLANT
TRANSDUCER W/STOPCOCK (MISCELLANEOUS) IMPLANT
TUBING ART PRESS 72 MALE/FEM (TUBING) IMPLANT

## 2023-04-25 NOTE — Interval H&P Note (Signed)
History and Physical Interval Note:  04/25/2023 1:29 PM  [name]  has presented today for surgery, with the diagnosis of heart failure - shortness of breath.  The various methods of treatment have been discussed with the patient and family. After consideration of risks, benefits and other options for treatment, the patient has consented to  Procedure(s): RIGHT HEART CATH (N/A) as a surgical intervention.  The patient's history has been reviewed, patient examined, no change in status, stable for surgery.  I have reviewed the patient's chart and labs.  Questions were answered to the patient's satisfaction.     Verne Carrow

## 2023-04-25 NOTE — Progress Notes (Signed)
[facility] Progress Note    Assessment/ Plan:    AKI on CKD3 -Baseline creatinine 1.1-1.4  ( 1.49 on 03/30/23) .  concern for ischemic insults/cardiac event and it appears that she has had progression of her CKD.  Normal saline was stopped and transitioned to LR per charting. Her CT does not show a definitive cause for her AKI, no obstruction, punctate stones are likely not the cause. UA is bland. Cr up to 2.5 despite fluids.  CK ok.  -----------  - she remains nonoliguric fortunately - Check urine protein/cr ratio -  is low - stopped fluids but then resumed in prep for cath but since not being done I will stop   - I am concerned this is her new baseline -  has stayed stable the last 3 days though-  maybe will see some trending down over weekend   Chest pain -ongoing - per cardiology. Note known CAD with hx of stent - timing of heart cath per cardiology discretion - she continues to have chest pain.  I have messaged the primary team and have spoken with cardiology re: same.  She may be at her new baseline Creatinine unfortunately.  Heart cath does carry risk of CKD progression and further AKI however also risks of untreated CAD   HTN - Increase imdur to 60 mg daily   CKD stage 3 - note baseline Cr 1.1 - 1.4  Normocytic Anemia -Transfuse for Hgb<8 g/dL. Note on heparin gtt - will check iron stored and act as needed    Uncontrolled Diabetes Mellitus Type 2 with Hyperglycemia -per primary team    Abdominal pain -s/p ct renal 8/6:  interval development of bibasilar peribronchovascular ground glass infiltrates (multifocal pneumonia or pulmonary edema), trace b/l pleural effusions, extensive multivessel coronary artery calcification, progressive splenomegaly, minimal bilateral nonobstructive nephrolithiasis (no obstruction), moderate colonic stool burden without obstruction, aortic atherosclerosis-  does not report this today      Subjective:   She has transferred  to [facility] with the objective of maybe doing a heart cath-  sounds like they are going to do a nuclear stress test.    UOP not recorded-  kidney function is stable      Objective:   BP (!) 145/77 (BP Location: Right Arm)   Pulse 68   Temp 97.9 F (36.6 C) (Oral)   Resp 20   Ht 5\' 3"  (1.6 m)   Wt 114.2 kg   SpO2 90%   BMI 44.60 kg/m   Intake/Output Summary (Last 24 hours) at 04/25/2023 1312 Last data filed at 04/24/2023 2200 Gross per 24 hour  Intake 240 ml  Output --  Net 240 ml   Weight change: -1.684 kg  Physical Exam:  General adult female in bed uncomfortable appearing  HEENT normocephalic atraumatic extraocular movements intact sclera anicteric Neck supple trachea midline Lungs clear to auscultation bilaterally normal work of breathing at rest  Heart S1S2 no rub Abdomen soft nontender nondistended Extremities no pitting edema  Psych normal mood and affect Neuro - alert and oriented x 3 provides history and follows commands  GU  no foley   Imaging: No results found.  Labs: BMET Recent Labs  Lab 04/19/23 0448 04/20/23 0455 04/21/23 0449 04/22/23 0432 04/23/23 0439 04/24/23 0447 04/25/23 0712  NA 136 137 136 134* 136 138 139  K 4.4 4.3 4.3 4.5 4.3 4.7 4.2  CL 107 108 111 104 103 102 101  CO2 19* 23 21* 19* 25 25 25  GLUCOSE 200* 147* 129* 156* 138* 187* 113*  BUN 19 20 18 19  21* 23* 22*  CREATININE 2.07* 2.17* 2.25* 2.33* 2.53* 2.56* 2.52*  CALCIUM 8.0* 7.8* 7.7* 8.2* 8.3* 8.5* 8.9  PHOS  --   --   --  3.6 4.2 4.6 5.1*   CBC Recent Labs  Lab 04/22/23 0432 04/23/23 0439 04/24/23 0447 04/25/23 0712  WBC 5.6 3.9* 4.3 4.4  HGB 9.0* 8.4* 9.0* 9.0*  HCT 28.1* 27.4* 29.4* 28.8*  MCV 85.4 87.8 87.5 86.7  PLT 144* 139* 155 151    Medications:     aspirin EC  81 mg Oral Daily   atorvastatin  80 mg Oral q morning   carvedilol  12.5 mg Oral BID WC   clonazePAM  0.5 mg Oral TID   clopidogrel  75 mg Oral Daily   gabapentin  300 mg Oral QHS   insulin  aspart  0-15 Units Subcutaneous TID WC   insulin aspart  14 Units Subcutaneous TID WC   insulin detemir  50 Units Subcutaneous BID   isosorbide mononitrate  60 mg Oral q morning   lamoTRIgine  100 mg Oral BID   multivitamin with minerals  1 tablet Oral Daily   polyethylene glycol  17 g Oral BID   senna-docusate  2 tablet Oral QHS   sertraline  100 mg Oral Daily      Cecille Aver, MD [facility] 04/25/2023, 1:12 PM

## 2023-04-25 NOTE — Code Documentation (Signed)
Stroke Response Nurse Documentation Code Documentation  [name] is a 61 y.o. female admitted to [facility]  on 04-24-2023 for cardiac cath with past medical hx of AF, DM, . On Eliquis (apixaban) daily. Code stroke was activated by attending MD .   Patient on 6Eunit where she was LKW at 1325 and now complaining of spots in her left eye vision.  She states that she has had loss of her vision in her left fields of her left eye for about a year and has had intermittent left leg weakness for a couple of months.  Stroke team at the bedside after patient activation. Patient to CT with team. NIHSS 2, see documentation for details and code stroke times. Patient with left leg weakness and left decreased sensation on exam. The following imaging was completed:  CT Head. Patient is not a candidate for IV Thrombolytic due to low suspicion of acute stroke. Patient is not a candidate for IR due to no LVO suspected..   Care/Plan: Code Stroke canceled.   Bedside handoff with RN Judeth Cornfield.    Marcellina Millin  Stroke Response RN

## 2023-04-25 NOTE — Progress Notes (Addendum)
Rounding Note    Patient Name: [name] Date of Encounter: 04/25/2023  [facility] Cardiologist: Marjo Bicker, MD   Subjective   Last chest pain was last night. She refuses nitroglycerin. She has a contrast allergy. She also reports shortness of breath. 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   Inpatient Medications    Scheduled Meds:  aspirin EC  81 mg Oral Daily   atorvastatin  80 mg Oral q morning   carvedilol  12.5 mg Oral BID WC   clonazePAM  0.5 mg Oral TID   clopidogrel  75 mg Oral Daily   gabapentin  300 mg Oral QHS   insulin aspart  0-15 Units Subcutaneous TID WC   insulin aspart  14 Units Subcutaneous TID WC   insulin detemir  50 Units Subcutaneous BID   isosorbide mononitrate  60 mg Oral q morning   lamoTRIgine  100 mg Oral BID   multivitamin with minerals  1 tablet Oral Daily   polyethylene glycol  17 g Oral BID   senna-docusate  2 tablet Oral QHS   sertraline  100 mg Oral Daily   Continuous Infusions:  sodium chloride 75 mL/hr at 04/25/23 0832   PRN Meds: acetaminophen **OR** acetaminophen, bisacodyl, diphenhydrAMINE, fentaNYL (SUBLIMAZE) injection, ondansetron **OR** ondansetron (ZOFRAN) IV, mouth rinse, oxyCODONE, prochlorperazine   Vital Signs    Vitals:   04/25/23 0319 04/25/23 0320 04/25/23 0558 04/25/23 0747  BP: (!) 126/43   (!) 145/77  Pulse: 64   68  Resp: 16   20  Temp: 98.7 F (37.1 C) 98.7 F (37.1 C)  97.9 F (36.6 C)  TempSrc: Oral   Oral  SpO2: 93%   90%  Weight:   114.2 kg   Height:        Intake/Output Summary (Last 24 hours) at 04/25/2023 0844 Last data filed at 04/24/2023 2200 Gross per 24 hour  Intake 240 ml  Output 500 ml  Net -260 ml      04/25/2023    5:58 AM 04/24/2023    4:26 AM 04/16/2023    1:44 PM  Last 3 Weights  Weight (lbs) 251 lb 12.8 oz 255 lb 8.2 oz 245 lb  Weight (kg) 114.216 kg 115.9 kg 111.131 kg       Telemetry    SR in the 60s - Personally Reviewed  ECG    No  new tracings - Personally Reviewed  Physical Exam   GEN: No acute distress.   Neck: No JVD Cardiac: RRR, no murmurs, rubs, or gallops.  Respiratory: Clear to auscultation bilaterally. GI: Soft, nontender, non-distended  MS: No edema; No deformity. Neuro:  Nonfocal  Psych: Normal affect   Labs    High Sensitivity Troponin:   Recent Labs  Lab 04/15/23 2158 04/15/23 2342 04/22/23 0432 04/24/23 1221 04/24/23 1436  TROPONINIHS 5 6 4 4 4      Chemistry Recent Labs  Lab 04/21/23 0449 04/22/23 0432 04/23/23 0439 04/24/23 0447 04/25/23 0712  NA 136   < > 136 138 139  K 4.3   < > 4.3 4.7 4.2  CL 111   < > 103 102 101  CO2 21*   < > 25 25 25   GLUCOSE 129*   < > 138* 187* 113*  BUN 18   < > 21* 23* 22*  CREATININE 2.25*   < > 2.53* 2.56* 2.52*  CALCIUM  7.7*   < > 8.3* 8.5* 8.9  MG 1.8  --   --   --   --   ALBUMIN  --    < > 2.8* 2.9* 2.7*  GFRNONAA 24*   < > 21* 21* 21*  ANIONGAP 4*   < > 8 11 13    < > = values in this interval not displayed.    Lipids No results for input(s): "CHOL", "TRIG", "HDL", "LABVLDL", "LDLCALC", "CHOLHDL" in the last 168 hours.  Hematology Recent Labs  Lab 04/23/23 0439 04/24/23 0447 04/25/23 0712  WBC 3.9* 4.3 4.4  RBC 3.12* 3.36* 3.32*  HGB 8.4* 9.0* 9.0*  HCT 27.4* 29.4* 28.8*  MCV 87.8 87.5 86.7  MCH 26.9 26.8 27.1  MCHC 30.7 30.6 31.3  RDW 18.3* 17.6* 17.6*  PLT 139* 155 151   Thyroid No results for input(s): "TSH", "FREET4" in the last 168 hours.  BNP Recent Labs  Lab 04/21/23 1256 04/22/23 0432  BNP 183.0* 297.0*    DDimer No results for input(s): "DDIMER" in the last 168 hours.   Radiology    No results found.  Cardiac Studies   Echo 04/22/23:  1. Limited Echo for IVC check.   2. Left ventricular ejection fraction, by estimation, is 60 to 65%. The  left ventricle has normal function. Left ventricular diastolic function  could not be evaluated.    3. Right ventricular systolic function is normal. The right ventricular  size is normal. There is normal pulmonary artery systolic pressure.   4. The inferior vena cava is normal in size with <50% respiratory  variability, suggesting right atrial pressure of 8 mmHg.    Echo 04/17/23:  1. Left ventricular ejection fraction, by estimation, is 55 to 60%. The  left ventricle has normal function. The left ventricle has no regional  wall motion abnormalities. Left ventricular diastolic parameters are  consistent with Grade I diastolic  dysfunction (impaired relaxation).   2. Right ventricular systolic function is normal. The right ventricular  size is normal. Tricuspid regurgitation signal is inadequate for assessing  PA pressure.   3. The mitral valve is abnormal. Mild mitral valve regurgitation. No  evidence of mitral stenosis.   4. The aortic valve is tricuspid. Aortic valve regurgitation is not  visualized. No aortic stenosis is present.   5. Aortic dilatation noted. There is mild dilatation of the ascending  aorta, measuring 36 mm.   6. The inferior vena cava is normal in size with greater than 50%  respiratory variability, suggesting right atrial pressure of 3 mmHg.    Cardiac Catheterization: 04/2021 Findings:  Coronary Arteries:   LM: Large, with 20% lesion in the distal LM prior to trifurcation.   LAD: Large, wraps around the apex. Gives off a small to medium sized  diagonal branch. There are two tandem tubular 40% stenoses in the proximal  and mid LAD.   Ramus: Large, bifurcates distally. 30-40% stenosis in the proximal to mid  vessel.   LCx: Large, non-dominant. Large OM1. No significant atherosclerosis.   RCA: Large, dominant. Gives off medium sized RPDA and RPLV branches.  Proximal stenosis with some haziness with 30-40% visual stenosis. 99%  stenosis in the mid RCA right before an acute marginal branch, with TIMI  2b flow distally. No change in stenosis or flow  with intracoronary  nitroglycerin.   LVEDP: 9 mmHg   Interventional Summary:   Lesion Vessel  Culprit Length Graft High/C  FINAL PCI LESION: Mid RCA Yes 15mm No  Yes  Severe Calcification Bifurication Pre-stenosis Pre-TIMI Post-stenosis  Post-TIMI  Yes No RCA 2 RCA 3   Patient Profile     62 y.o. female with a history of CKD, CAD with PCI 2022, PAF on eliquis, HLD, DM2 with hyperglycemia, and anemia presented to [facility] with chest pain  Assessment & Plan    Chest pain - concerning for unstable angina - hs troponin have remained negative throughout her hospitalization - EKG nonischemic - renal function precluding heart cath today without clear plan from nephrology regarding dialysis  - she is willing to proceed with heart cath even if she needs HD after - however, given her negative enzymes and EKG, could consider nuclear stress test to guide decisions regarding angiography - she has had breakfast, will make NPO  - plan for right heart cath today   CAD  Hx of NSTEMI with DES-RCA 2022 ([place]) - received 2.75 x 18 mm DES, remaining nonobstructive disease treated medically - no chest pain, was in the setting of retinal artery occlusion / stroke - PTA - on plavix in the setting of eliquis - now on ASA and plavix - prior to discharge, would resume plavix and eliquis   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 - sCr now 2.52 - she states this is new for her - sCr 1.1 in 10/2022 - appreciate nephrology input - pending renal artery ultrasound   Paroxymal atrial fibrillation - maintaining sinus rhythm with coreg   Chronic anticoagulation - maintained on eliquis 5 mg BID - not on eliquis, also not on heparin gtt anymore   OAC / antiplatelet therapy - prior to admission, was on plavis and eliquis - on admission, on heparin gtt for chest pain, but this has been stopped given negative enzymes - with history of Afib, consider restarting heparin gtt - will hold off on eliquis until we  can make decisions regarding heart cath - thankfully, appears to be maintaining sinus rhythm - given history of stroke, is high risk to be off of OAC        For questions or updates, please contact [facility] Please consult [email or web address] for contact info under        Signed, Marcelino Duster, PA  04/25/2023, 8:44 AM     Personally seen and examined. Agree with APP above with the following comments:  Briefly 62 yo F with many contrast allergies, prior CAD, chest pain and SOB.  Transferred for consideration or LHC and RHC    Patient notes intermittent chest pain.  She has not had a prior anginal equivalent.  She has noted no nausea or vomiting with IMDUR (has nitroglycerin listed as an allergy).  She feels short of breath.  She is amenable to HD if needed, but she is not sure what the plan is  Exam notable for regular rate, no murmurs, morbid obesity, non pitting edema.  Labs notable for worsening creatine 2.5 Tele: SR  Would recommend  - no evidence of MI but concerning for anginal - AKI without clear etiology has received both lasix and IVF - will get RHC, after may diurese - if worsening CP increase to 90 Imdur - once there is a clear plan for dialysis we would proceed with diagnostic LHC - after RHC restart AC for PAF - peri-discharge not planned for triple therapy unless intervention  Riley Lam, MD FASE [facility] Cardiologist [facility]  [address], [address] [place], [place] [address] [phone]  11:44 AM

## 2023-04-25 NOTE — TOC Progression Note (Signed)
Transition of Care [facility]) - Progression Note    Patient Details  Name: [name] MRN: [id number] Date of Birth: 1961-03-20  Transition of Care [facility]) CM/SW Contact  Graves-Bigelow, Lamar Laundry, RN Phone Number: 04/25/2023, 4:13 PM  Clinical Narrative: Patient is aware that she will need to get Medicaid associated with the county she lives in. Patient has [facility] and Case Production [employer], theatre/television/film will not be able to arrange home health services in  [place].     Expected Discharge Plan: Home/Self Care Barriers to Discharge: Continued Medical Work up  Expected Discharge Plan and Services       Living arrangements for the past 2 months: Single Family Home  Social Determinants of Health (SDOH) Interventions SDOH Screenings   Food Insecurity: No Food Insecurity (04/16/2023)  Housing: Low Risk  (04/16/2023)  Transportation Needs: Unmet Transportation Needs (04/16/2023)  Utilities: Not At Risk (04/16/2023)  Tobacco Use: Medium Risk (04/15/2023)    Readmission Risk Interventions     No data to display

## 2023-04-25 NOTE — Plan of Care (Signed)

## 2023-04-25 NOTE — Progress Notes (Signed)
CSW received consult for patient. CSW spoke with patient at bedside.Patient reports she comes from home with daughter and Grandkids. CSW offered patient transportation resources. Patient accepted. Patient reports her daughter will pick her up when medically ready for dc. All questions answered. No further questions reported at this time.

## 2023-04-25 NOTE — Consult Note (Signed)
Neurology Consultation  Reason for Consult: Code Stroke Referring Physician: Dr. Dartha Lodge  CC: Left eye left peripheral visual loss / spots  History is obtained from: Chart review, patient, bedside RN  HPI: [name] is a 62 y.o. 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  Workup revealed patient with heart failure and patient was subsequently transferred to [facility] for further workup and evaluation.  Today, 04/25/2023 patient underwent a right heart catheterization and was admitted following to 6E.  This afternoon, patient began complaining of spots in her left eye left peripheral visual field and a Code Stoke was activated.  Patient reports that she has had left eye left peripheral visual field loss for about 1 year but spots in this area vision are new today.  Patient also reports that she has had some intermittent left lower extremity weakness ongoing for few months now.  LKW: 13:25 TNK given?: no, symptoms appear to be chronic, low suspicion for acute stroke.  Also patient with recent heparin administration during cardiac cath. IR Thrombectomy? No, presentation is not consistent with an LVO Modified Rankin Scale: 2-Slight disability-UNABLE to perform all activities but does not need assistance  ROS: A complete ROS was performed and is negative except as noted in the HPI.   Past Medical History:  Diagnosis Date   Asthma    Atrial fibrillation (HCC)    Bipolar 1 disorder (HCC)    Chronic back pain    Chronic chest pain    Diabetes mellitus without complication (HCC)    Gastroparesis    GERD (gastroesophageal reflux disease)    Hyperlipemia    Hypertension    IBS (irritable bowel syndrome)     Normal cardiac stress test 02/2014   [facility]   Past Surgical History:  Procedure Laterality Date   ABDOMINAL HYSTERECTOMY  2001   APPENDECTOMY  10/2005   CHOLECYSTECTOMY  10/2005   COLONOSCOPY  2007   Patel ([name])-pt reports hemorrhoids   ESOPHAGOGASTRODUODENOSCOPY  02/28/2006   [name], normal on daily PPI, myultiple hyperplastic polyps   MOUTH SURGERY     RIGHT OOPHORECTOMY  1999   Family History  Problem Relation Age of Onset   Cirrhosis Mother 69       ?etiology   Cirrhosis Father        ?etiology   Cirrhosis Sister 30       ?etiology   Diverticulitis Sister    Social History:   reports that she is a non-smoker but has been exposed to tobacco smoke. She has never used smokeless tobacco. She reports that she does not drink alcohol and does not use drugs.  Medications  Current Facility-Administered Medications:    0.9 %  sodium chloride infusion, 250 mL, Intravenous, PRN, Kathleene Hazel, MD   acetaminophen (TYLENOL) tablet 650 mg, 650 mg, Oral, Q6H PRN, 650 mg at 04/23/23 1406 **OR** acetaminophen (TYLENOL) suppository 650 mg, 650 mg, Rectal, Q6H PRN, Kathleene Hazel, MD   aspirin EC tablet 81 mg, 81 mg, Oral, Daily, Kathleene Hazel, MD, 81 mg at 04/25/23 0819   atorvastatin (LIPITOR) tablet 80 mg, 80 mg, Oral, q morning, Kathleene Hazel, MD, 80 mg at 04/25/23 0819   bisacodyl (DULCOLAX)  suppository 10 mg, 10 mg, Rectal, Daily PRN, Kathleene Hazel, MD   carvedilol (COREG) tablet 12.5 mg, 12.5 mg, Oral, BID WC, Verne Carrow D, MD, 12.5 mg at 04/25/23 1642   clonazePAM (KLONOPIN) tablet 0.5 mg, 0.5 mg, Oral, TID, Kathleene Hazel, MD, 0.5 mg at 04/25/23 1642   clopidogrel (PLAVIX) tablet 75 mg, 75 mg, Oral, Daily, Kathleene Hazel, MD, 75 mg at 04/25/23 0981   Darbepoetin Alfa (ARANESP) injection 60 mcg, 60 mcg, Subcutaneous, Q Fri-1800, Kathleene Hazel, MD, 60 mcg at 04/25/23 1653    diphenhydrAMINE (BENADRYL) capsule 25 mg, 25 mg, Oral, QHS PRN, Kathleene Hazel, MD   fentaNYL (SUBLIMAZE) injection 25 mcg, 25 mcg, Intravenous, Q2H PRN, Kathleene Hazel, MD, 25 mcg at 04/25/23 1030   ferric gluconate (FERRLECIT) 250 mg in sodium chloride 0.9 % 250 mL IVPB, 250 mg, Intravenous, Daily, Kathleene Hazel, MD, Last Rate: 135 mL/hr at 04/25/23 1803, Infusion Verify at 04/25/23 1803   gabapentin (NEURONTIN) capsule 300 mg, 300 mg, Oral, QHS, Verne Carrow D, MD, 300 mg at 04/24/23 2138   hydrALAZINE (APRESOLINE) injection 10 mg, 10 mg, Intravenous, Q20 Min PRN, Verne Carrow D, MD   insulin aspart (novoLOG) injection 0-15 Units, 0-15 Units, Subcutaneous, TID WC, Kathleene Hazel, MD, 3 Units at 04/25/23 1642   insulin aspart (novoLOG) injection 14 Units, 14 Units, Subcutaneous, TID WC, Kathleene Hazel, MD, 14 Units at 04/24/23 1803   insulin detemir (LEVEMIR) injection 50 Units, 50 Units, Subcutaneous, BID, Kathleene Hazel, MD, 50 Units at 04/25/23 1321   isosorbide mononitrate (IMDUR) 24 hr tablet 60 mg, 60 mg, Oral, q morning, Kathleene Hazel, MD, 60 mg at 04/25/23 1914   labetalol (NORMODYNE) injection 10 mg, 10 mg, Intravenous, Q10 min PRN, Kathleene Hazel, MD   lamoTRIgine (LAMICTAL) tablet 100 mg, 100 mg, Oral, BID, Kathleene Hazel, MD, 100 mg at 04/25/23 7829   multivitamin with minerals tablet 1 tablet, 1 tablet, Oral, Daily, Kathleene Hazel, MD, 1 tablet at 04/25/23 0819   nitroGLYCERIN (NITROSTAT) SL tablet 0.4 mg, 0.4 mg, Sublingual, Q5 min PRN, Kathleene Hazel, MD   ondansetron [facility]) tablet 4 mg, 4 mg, Oral, Q6H PRN, 4 mg at 04/20/23 1604 **OR** ondansetron (ZOFRAN) injection 4 mg, 4 mg, Intravenous, Q6H PRN, Kathleene Hazel, MD, 4 mg at 04/25/23 0825   Oral care mouth rinse, 15 mL, Mouth Rinse, PRN, Kathleene Hazel, MD   oxyCODONE (Oxy IR/ROXICODONE)  immediate release tablet 5 mg, 5 mg, Oral, Q6H PRN, Kathleene Hazel, MD, 5 mg at 04/25/23 1632   polyethylene glycol (MIRALAX / GLYCOLAX) packet 17 g, 17 g, Oral, BID, Kathleene Hazel, MD, 17 g at 04/25/23 5621   prochlorperazine (COMPAZINE) injection 10 mg, 10 mg, Intravenous, Q4H PRN, Kathleene Hazel, MD, 10 mg at 04/18/23 1649   senna-docusate (Senokot-S) tablet 2 tablet, 2 tablet, Oral, QHS, Kathleene Hazel, MD, 2 tablet at 04/23/23 2151   sertraline (ZOLOFT) tablet 100 mg, 100 mg, Oral, Daily, Verne Carrow D, MD, 100 mg at 04/25/23 0819   sodium chloride flush (NS) 0.9 % injection 3 mL, 3 mL, Intravenous, Q12H, Verne Carrow D, MD, 3 mL at 04/25/23 1626   sodium chloride flush (NS) 0.9 % injection 3 mL, 3 mL, Intravenous, PRN, Kathleene Hazel, MD  Exam: Current vital signs: BP (!) 124/53 (BP Location: Left Wrist)   Pulse 63   Temp 98.2 F (36.8 C) (Oral)  Resp 18   Ht 5\' 3"  (1.6 m)   Wt 114.2 kg   SpO2 93%   BMI 44.60 kg/m  Vital signs in last 24 hours: Temp:  [97.9 F (36.6 C)-98.7 F (37.1 C)] 98.2 F (36.8 C) (08/09 1424) Pulse Rate:  [0-70] 63 (08/09 1639) Resp:  [15-20] 18 (08/09 1424) BP: (117-161)/(43-77) 124/53 (08/09 1829) SpO2:  [88 %-94 %] 93 % (08/09 1639) Weight:  [409.8 kg] 114.2 kg (08/09 0558)  GENERAL: Obese Caucasian female laying in hospital bed, in no acute distress Psych: Affect appropriate for situation, patient is calm and cooperative with examination Head: Normocephalic and atraumatic, without obvious abnormality EENT: Normal conjunctivae, dry mucous membranes, no OP obstruction LUNGS: Normal respiratory effort. Non-labored breathing CV: Regular rate and rhythm on telemetry ABDOMEN: Soft, non-tender, non-distended Extremities: wWarm, well perfused, without obvious deformity  NEURO:  Mental Status: Awake, alert, and oriented to person, place, time, and situation. Speech is intact, no  evidence of aphasia or dysarthria Patient follows commands without difficulty Cranial Nerves:  II: PERRL.  Left eye left peripheral visual field loss as result of previous retinal artery occlusion, chronic.  III, IV, VI: EOMI without gaze preference, ptosis, nystagmus V: Sensation is intact to light touch and symmetrical to face.  VII: Face is symmetric resting and with movement VIII: Hearing intact to voice IX, X: Palate elevation is symmetric. Phonation normal.  XI: Normal sternocleidomastoid and trapezius muscle strength XII: Tongue protrudes midline without fasciculations.   Motor: Bilateral lower extremities with drift on assessment Bilateral upper extremities elevate antigravity without vertical drift Tone is normal. Bulk is normal.  Sensation: Decree sensation to light touch on the left face arm and leg though inconsistent reporting Coordination: No overt ataxia Gait: Deferred  NIHSS: 1a Level of Conscious.: 0 1b LOC Questions: 0 1c LOC Commands: 0 2 Best Gaze: 0 3 Visual: 1 (chronic) 4 Facial Palsy: 0 5a Motor Arm - left: 0 5b Motor Arm - Right: 0 6a Motor Leg - Left: 1  (intermittent for months) 6b Motor Leg - Right: 1 7 Limb Ataxia: 0 8 Sensory: 1 9 Best Language: 0 10 Dysarthria: 0 11 Extinct. and Inatten.: 0 TOTAL: 4  Labs I have reviewed labs in epic and the results pertinent to this consultation are: CBC    Component Value Date/Time   WBC 4.4 04/25/2023 0712   RBC 3.32 (L) 04/25/2023 0712   HGB 8.8 (L) 04/25/2023 1404   HCT 26.0 (L) 04/25/2023 1404   PLT 151 04/25/2023 0712   MCV 86.7 04/25/2023 0712   MCH 27.1 04/25/2023 0712   MCHC 31.3 04/25/2023 0712   RDW 17.6 (H) 04/25/2023 0712   LYMPHSABS 1.6 11/10/2022 0107   MONOABS 0.3 11/10/2022 0107   EOSABS 0.1 11/10/2022 0107   BASOSABS 0.0 11/10/2022 0107   CMP     Component Value Date/Time   NA 138 04/25/2023 1404   K 4.3 04/25/2023 1404   CL 101 04/25/2023 0712   CO2 25 04/25/2023 0712    GLUCOSE 113 (H) 04/25/2023 0712   BUN 22 (H) 04/25/2023 0712   CREATININE 2.52 (H) 04/25/2023 0712   CALCIUM 8.9 04/25/2023 0712   PROT 8.4 (H) 04/16/2023 0652   ALBUMIN 2.7 (L) 04/25/2023 0712   AST 21 04/16/2023 0652   ALT 24 04/16/2023 0652   ALKPHOS 53 04/16/2023 0652   BILITOT 0.4 04/16/2023 0652   GFRNONAA 21 (L) 04/25/2023 0712   GFRAA >60 05/28/2018 2119   Lipid Panel  Component Value Date/Time   CHOL 204 (H) 04/17/2023 0636   TRIG 448 (H) 04/17/2023 0636   HDL 26 (L) 04/17/2023 0636   CHOLHDL 7.8 04/17/2023 0636   VLDL UNABLE TO CALCULATE IF TRIGLYCERIDE OVER 400 mg/dL 16/06/9603 5409   LDLCALC UNABLE TO CALCULATE IF TRIGLYCERIDE OVER 400 mg/dL 81/19/1478 2956   LDLDIRECT 120 (H) 04/17/2023 0636   Imaging I have reviewed the images obtained:  CT-scan of the brain: neg.    Assessment: 62 year old female with PMHx of retinal artery occlusion with residual left eye left peripheral visual field loss approximately, CKD IIIb, CAD s/p PCI 2022, PAF on Eliquis, HLD, DM2, obesity who initially presented to the hospital with complaints of left-sided chest pain and workup found patient to be in heart failure.  Patient was taken to the Cath Lab on 04/25/2023 for right heart catheterization was admitted to 6E following the procedure the patient complained of left eye left peripheral visual field spots and a code stroke activated.  There is no new area of vision loss noted on examination.  Patient did complain of some possible left lower extremity weakness though she reports that this has been intermittent over the past few months.  Patient also reports some mild left hemibody decreased sensation to light touch to one provider this complaint is more consistent examiners.  Patient was taken to CT for further evaluation.  Recommendations: - CT head without contrast - Continue prophylaxis per cardiology, per cardiology notes, plan to restart Eliquis following right heart cath - No further  inpatient work up indicated at this time with chronic symptoms - Neurology will be available on an as needed basis for further questions or concerns  Lanae Boast, AGAC-NP [facility] Pager: [phone]  ATTENDING ATTESTATION:  62 year old with history of retinal occlusion in the left residual peripheral vision loss on the left side of the left eye.  Status post cath earlier today.  Had floaters in her left eye visual field in the same distribution which was monocular.  Stat head CT completed was negative.  Restart Eliquis when safe from cardiac standpoint.  No need for further workup.  Code stroke was canceled.  Neurology signed off.  Please call if needed.  Dr. Viviann Spare evaluated pt independently, reviewed imaging, chart, labs. Discussed and formulated plan with the Resident/APP. Changes were made to the note where appropriate. Please see APP/resident note above for details.     MDM: High. Pertinent labs, imaging results reviewed by me and considered in my decision making. Independently reviewed imaging. Medical records reviewed. Discussed the patient with another medical provider/personnel.       ,MD

## 2023-04-25 NOTE — Progress Notes (Signed)
PT Cancellation Note  Patient Details Name: [name] MRN: [id number] DOB: 1961/07/09   Cancelled Treatment:    Reason Eval/Treat Not Completed: Medical issues which prohibited therapy (Pt c/o chest pain and nurse made aware.  Nurse asked to HOLD PT. Plan for heart cath this pm.)   Bevelyn Buckles 04/25/2023, 12:16 PM  M,PT Acute Rehab Services [phone]

## 2023-04-25 NOTE — Progress Notes (Signed)
IV team responded to Code stroke, met patient in CT. Rapid response RN stated no new access needed at this time.

## 2023-04-25 NOTE — H&P (View-Only) (Signed)
Rounding Note    Patient Name: [name] Date of Encounter: 04/25/2023  [facility] Cardiologist: Marjo Bicker, MD   Subjective   Last chest pain was last night. She refuses nitroglycerin. She has a contrast allergy. She also reports shortness of breath. 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   Inpatient Medications    Scheduled Meds:  aspirin EC  81 mg Oral Daily   atorvastatin  80 mg Oral q morning   carvedilol  12.5 mg Oral BID WC   clonazePAM  0.5 mg Oral TID   clopidogrel  75 mg Oral Daily   gabapentin  300 mg Oral QHS   insulin aspart  0-15 Units Subcutaneous TID WC   insulin aspart  14 Units Subcutaneous TID WC   insulin detemir  50 Units Subcutaneous BID   isosorbide mononitrate  60 mg Oral q morning   lamoTRIgine  100 mg Oral BID   multivitamin with minerals  1 tablet Oral Daily   polyethylene glycol  17 g Oral BID   senna-docusate  2 tablet Oral QHS   sertraline  100 mg Oral Daily   Continuous Infusions:  sodium chloride 75 mL/hr at 04/25/23 0832   PRN Meds: acetaminophen **OR** acetaminophen, bisacodyl, diphenhydrAMINE, fentaNYL (SUBLIMAZE) injection, ondansetron **OR** ondansetron (ZOFRAN) IV, mouth rinse, oxyCODONE, prochlorperazine   Vital Signs    Vitals:   04/25/23 0319 04/25/23 0320 04/25/23 0558 04/25/23 0747  BP: (!) 126/43   (!) 145/77  Pulse: 64   68  Resp: 16   20  Temp: 98.7 F (37.1 C) 98.7 F (37.1 C)  97.9 F (36.6 C)  TempSrc: Oral   Oral  SpO2: 93%   90%  Weight:   114.2 kg   Height:        Intake/Output Summary (Last 24 hours) at 04/25/2023 0844 Last data filed at 04/24/2023 2200 Gross per 24 hour  Intake 240 ml  Output 500 ml  Net -260 ml      04/25/2023    5:58 AM 04/24/2023    4:26 AM 04/16/2023    1:44 PM  Last 3 Weights  Weight (lbs) 251 lb 12.8 oz 255 lb 8.2 oz 245 lb  Weight (kg) 114.216 kg 115.9 kg 111.131 kg       Telemetry    SR in the 60s - Personally Reviewed  ECG    No  new tracings - Personally Reviewed  Physical Exam   GEN: No acute distress.   Neck: No JVD Cardiac: RRR, no murmurs, rubs, or gallops.  Respiratory: Clear to auscultation bilaterally. GI: Soft, nontender, non-distended  MS: No edema; No deformity. Neuro:  Nonfocal  Psych: Normal affect   Labs    High Sensitivity Troponin:   Recent Labs  Lab 04/15/23 2158 04/15/23 2342 04/22/23 0432 04/24/23 1221 04/24/23 1436  TROPONINIHS 5 6 4 4 4      Chemistry Recent Labs  Lab 04/21/23 0449 04/22/23 0432 04/23/23 0439 04/24/23 0447 04/25/23 0712  NA 136   < > 136 138 139  K 4.3   < > 4.3 4.7 4.2  CL 111   < > 103 102 101  CO2 21*   < > 25 25 25   GLUCOSE 129*   < > 138* 187* 113*  BUN 18   < > 21* 23* 22*  CREATININE 2.25*   < > 2.53* 2.56* 2.52*  CALCIUM  7.7*   < > 8.3* 8.5* 8.9  MG 1.8  --   --   --   --   ALBUMIN  --    < > 2.8* 2.9* 2.7*  GFRNONAA 24*   < > 21* 21* 21*  ANIONGAP 4*   < > 8 11 13    < > = values in this interval not displayed.    Lipids No results for input(s): "CHOL", "TRIG", "HDL", "LABVLDL", "LDLCALC", "CHOLHDL" in the last 168 hours.  Hematology Recent Labs  Lab 04/23/23 0439 04/24/23 0447 04/25/23 0712  WBC 3.9* 4.3 4.4  RBC 3.12* 3.36* 3.32*  HGB 8.4* 9.0* 9.0*  HCT 27.4* 29.4* 28.8*  MCV 87.8 87.5 86.7  MCH 26.9 26.8 27.1  MCHC 30.7 30.6 31.3  RDW 18.3* 17.6* 17.6*  PLT 139* 155 151   Thyroid No results for input(s): "TSH", "FREET4" in the last 168 hours.  BNP Recent Labs  Lab 04/21/23 1256 04/22/23 0432  BNP 183.0* 297.0*    DDimer No results for input(s): "DDIMER" in the last 168 hours.   Radiology    No results found.  Cardiac Studies   Echo 04/22/23:  1. Limited Echo for IVC check.   2. Left ventricular ejection fraction, by estimation, is 60 to 65%. The  left ventricle has normal function. Left ventricular diastolic function  could not be evaluated.    3. Right ventricular systolic function is normal. The right ventricular  size is normal. There is normal pulmonary artery systolic pressure.   4. The inferior vena cava is normal in size with <50% respiratory  variability, suggesting right atrial pressure of 8 mmHg.    Echo 04/17/23:  1. Left ventricular ejection fraction, by estimation, is 55 to 60%. The  left ventricle has normal function. The left ventricle has no regional  wall motion abnormalities. Left ventricular diastolic parameters are  consistent with Grade I diastolic  dysfunction (impaired relaxation).   2. Right ventricular systolic function is normal. The right ventricular  size is normal. Tricuspid regurgitation signal is inadequate for assessing  PA pressure.   3. The mitral valve is abnormal. Mild mitral valve regurgitation. No  evidence of mitral stenosis.   4. The aortic valve is tricuspid. Aortic valve regurgitation is not  visualized. No aortic stenosis is present.   5. Aortic dilatation noted. There is mild dilatation of the ascending  aorta, measuring 36 mm.   6. The inferior vena cava is normal in size with greater than 50%  respiratory variability, suggesting right atrial pressure of 3 mmHg.    Cardiac Catheterization: 04/2021 Findings:  Coronary Arteries:   LM: Large, with 20% lesion in the distal LM prior to trifurcation.   LAD: Large, wraps around the apex. Gives off a small to medium sized  diagonal branch. There are two tandem tubular 40% stenoses in the proximal  and mid LAD.   Ramus: Large, bifurcates distally. 30-40% stenosis in the proximal to mid  vessel.   LCx: Large, non-dominant. Large OM1. No significant atherosclerosis.   RCA: Large, dominant. Gives off medium sized RPDA and RPLV branches.  Proximal stenosis with some haziness with 30-40% visual stenosis. 99%  stenosis in the mid RCA right before an acute marginal branch, with TIMI  2b flow distally. No change in stenosis or flow  with intracoronary  nitroglycerin.   LVEDP: 9 mmHg   Interventional Summary:   Lesion Vessel  Culprit Length Graft High/C  FINAL PCI LESION: Mid RCA Yes 15mm No  Yes  Severe Calcification Bifurication Pre-stenosis Pre-TIMI Post-stenosis  Post-TIMI  Yes No RCA 2 RCA 3   Patient Profile     62 y.o. female with a history of CKD, CAD with PCI 2022, PAF on eliquis, HLD, DM2 with hyperglycemia, and anemia presented to [facility] with chest pain  Assessment & Plan    Chest pain - concerning for unstable angina - hs troponin have remained negative throughout her hospitalization - EKG nonischemic - renal function precluding heart cath today without clear plan from nephrology regarding dialysis  - she is willing to proceed with heart cath even if she needs HD after - however, given her negative enzymes and EKG, could consider nuclear stress test to guide decisions regarding angiography - she has had breakfast, will make NPO  - plan for right heart cath today   CAD  Hx of NSTEMI with DES-RCA 2022 ([place]) - received 2.75 x 18 mm DES, remaining nonobstructive disease treated medically - no chest pain, was in the setting of retinal artery occlusion / stroke - PTA - on plavix in the setting of eliquis - now on ASA and plavix - prior to discharge, would resume plavix and eliquis   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 - sCr now 2.52 - she states this is new for her - sCr 1.1 in 10/2022 - appreciate nephrology input - pending renal artery ultrasound   Paroxymal atrial fibrillation - maintaining sinus rhythm with coreg   Chronic anticoagulation - maintained on eliquis 5 mg BID - not on eliquis, also not on heparin gtt anymore   OAC / antiplatelet therapy - prior to admission, was on plavis and eliquis - on admission, on heparin gtt for chest pain, but this has been stopped given negative enzymes - with history of Afib, consider restarting heparin gtt - will hold off on eliquis until we  can make decisions regarding heart cath - thankfully, appears to be maintaining sinus rhythm - given history of stroke, is high risk to be off of OAC        For questions or updates, please contact [facility] Please consult [email or web address] for contact info under        Signed, Marcelino Duster, PA  04/25/2023, 8:44 AM     Personally seen and examined. Agree with APP above with the following comments:  Briefly 62 yo F with many contrast allergies, prior CAD, chest pain and SOB.  Transferred for consideration or LHC and RHC    Patient notes intermittent chest pain.  She has not had a prior anginal equivalent.  She has noted no nausea or vomiting with IMDUR (has nitroglycerin listed as an allergy).  She feels short of breath.  She is amenable to HD if needed, but she is not sure what the plan is  Exam notable for regular rate, no murmurs, morbid obesity, non pitting edema.  Labs notable for worsening creatine 2.5 Tele: SR  Would recommend  - no evidence of MI but concerning for anginal - AKI without clear etiology has received both lasix and IVF - will get RHC, after may diurese - if worsening CP increase to 90 Imdur - once there is a clear plan for dialysis we would proceed with diagnostic LHC - after RHC restart AC for PAF - peri-discharge not planned for triple therapy unless intervention  Riley Lam, MD FASE [facility] Cardiologist [facility]  [address], [address] [place], [place] [address] [phone]  11:44 AM

## 2023-04-25 NOTE — Progress Notes (Signed)
PROGRESS NOTE  [name] UJW:[id number] DOB: 09-24-1960 DOA: 04/15/2023 PCP: Patient, No Pcp Per  Brief History:  62 y.o. female with medical history significant of CAD s/p PCI, hypertension, hyperlipidemia, T2DM, DVT on Eliquis who presents to the emergency department with complaints of chest pain.  Patient complained of left-sided chest pain which occurred yesterday while sitting in a car chest pain was described as sharp and nonreproducible and was rated as 8/10 on pain scale with radiation to the back, this was associated with dizziness and nausea without vomiting.  She states that she called her PCP who advised her to go to the ED for further evaluation and management.  Patient denies shortness of breath, diaphoresis, fever.  The patient's troponins have been unremarkable.  Echocardiogram on 04/22/2023 showed EF 60 to 65%, trivial MR, normal RV EF.  However, patient continued to have intermittent chest pain throughout her hospitalization.  Her hospitalization was prolonged due to development of acute on chronic renal failure with serum creatinine up to 2.56.  Nephrology was consulted.  The patient was started on IV heparin for her persistent chest pain.  She received IV heparin for about a week and it was ultimately discontinued on 04/24/2023.  After discussion with nephrology, it was felt that the patient likely may have new renal baseline.  After discussion with the patient of the risk, benefits, and alternatives, all parties involved agreed to pursue left heart catheterization.   04/25/2023: Patient underwent cardiac catheterization today.  Cardiology input is appreciated.  Postcatheterization, patient reports seeing spots, with associated left-sided eye pain.  On further questioning, patient reports prior episodes of similar symptoms.  CT stroke protocol has been activated.  Patient is currently on aspirin and Plavix.  Neurology input is appreciated.  Assessment/Plan: Chest pain with  high risk for cardiac etiology/unstable angina - appreciate cardiology team consultation - continue supportive measures, IV heparin infusion and high dose statin  - working to improve creatinine in case cath is approved - cardio team planning to premedicate for cath given history of contrast allergy -has some atypical features--requiring fentanyl IV pushes for pain -Patient has received 8 days of intravenous heparin.  This was discontinued on 04/24/2023. -04/22/2023 echo EF 60 to 65%, normal RV EF, trivial TR. -After discussion with nephrology and the patient, it was decided to go forward with left heart catheterization. 04/25/2023: Patient underwent cardiac catheterization today.  Awaiting full report.   AKI on CKD stage 3b  -Baseline creatinine 1.2-1.4 -Serum creatinine peaking 2.53 - continue IV fluids per renal - recheck renal panel - renal US done 8/3 with normal findings reported  - urinalysis reassuring - appreciate nephrology consultation to assist with getting medically optimized for cath - CT stone study--negative hydronephrosis, bibasilar GGO--infx vs edema; trace bilateral pleural effusion, moderate stool burden -8/8--discussed with Dr. Nicolette Bang may have new renal baseline  -Patient agrees to move forward with left heart catheterization with the understanding of worsening renal function. 04/24/2020: Serum creatinine is 1.21 today.  Continue to monitor renal function closely.  pulmonary infiltrates -viral respiratory panel--negative -check COVID negative -check PCT < 0.10   Anemia in CKD  - Hg down to 7.7 with hydration - with her CAD, cardiology team want her Hg>8 - transfused 1 unit PRBC on 8/5 - Hg up to 9 after transfusion - follow   - hemoccult stools     RLQ abdominal pain - intermittent, lipase test came back normal - pt  reports appendectomy and cholecystectomy - CT abd/pelvis without contrast ordered (due to contrast allergy and upcoming cath) - 8/2 CT AP  with findings of hepatomegaly and steatosis of liver, no acute findings to explain abd pain - 8/6 CT renal protocol--negative hydronephrosis, bibasilar GGO--infx vs edema; trace bilateral pleural effusion, moderate stool burden  CAD -continue ASA/plavix, statin -s/p RCA PCI in [place] 2022   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 - increased prandial novolog to 14 units TID with meals - continue home detemir 50 units BID  - 04/15/23 A1C--9.1    Chronic atrial fibrillation - carvedilol for heart rate control - holding apixaban in anticipation for possible cath later this week  - IV heparin for anticoagulation>>stopped 8/8   Hyperlipidemia - atorvastatin 80 mg daily    Essential Hypertension  - resumed home carvedilol with good BP control    Morbid Obesity -BMI 45.26 -lifestyle modification       Family Communication:   no Family at bedside   Consultants:  renal, cards   Code Status:  FULL   DVT Prophylaxis:  IV Heparin     Procedures: As Listed in Progress Note Above   Antibiotics: None   Subjective: Patient continues to report chest pain..  Objective: Vitals:   04/25/23 1357 04/25/23 1402 04/25/23 1407 04/25/23 1424  BP: (!) 142/61 135/64  (!) 126/58  Pulse: 64 64 (!) 0 61  Resp: 16 17  18   Temp:    98.2 F (36.8 C)  TempSrc:    Oral  SpO2: 94% 94% 94% 94%  Weight:      Height:        Intake/Output Summary (Last 24 hours) at 04/25/2023 1511 Last data filed at 04/24/2023 2200 Gross per 24 hour  Intake 240 ml  Output --  Net 240 ml   Weight change: -1.684 kg Exam:  General:  Pt is morbidly obese.  Not in any distress.  Awake and alert.   HEENT: Patient is pale.  No jaundice.   Cardiovascular: S1-S2. Respiratory: Clear to auscultation. Abdomen: Obese, soft and  nontender. Neuro: Awake and alert.  Moves all extremities.    Extremities: No edema  Data Reviewed: I have personally reviewed following labs and imaging studies Basic Metabolic Panel: Recent Labs  Lab 04/21/23 0449 04/22/23 0432 04/23/23 0439 04/24/23 0447 04/25/23 0712 04/25/23 1402 04/25/23 1404  NA 136 134* 136 138 139 142 138  K 4.3 4.5 4.3 4.7 4.2 3.9 4.3  CL 111 104 103 102 101  --   --   CO2 21* 19* 25 25 25   --   --   GLUCOSE 129* 156* 138* 187* 113*  --   --   BUN 18 19 21* 23* 22*  --   --   CREATININE 2.25* 2.33* 2.53* 2.56* 2.52*  --   --   CALCIUM 7.7* 8.2* 8.3* 8.5* 8.9  --   --   MG 1.8  --   --   --   --   --   --   PHOS  --  3.6 4.2 4.6 5.1*  --   --    Liver Function Tests: Recent Labs  Lab 04/22/23 0432 04/23/23 0439 04/24/23 0447 04/25/23 0712  ALBUMIN 3.0* 2.8* 2.9* 2.7*   No results for input(s): "LIPASE", "AMYLASE"  in the last 168 hours.  No results for input(s): "AMMONIA" in the last 168 hours. Coagulation Profile: No results for input(s): "INR", "PROTIME" in the last 168 hours. CBC: Recent Labs  Lab 04/21/23 0449 04/22/23 0432 04/23/23 0439 04/24/23 0447 04/25/23 0712 04/25/23 1402 04/25/23 1404  WBC 3.9* 5.6 3.9* 4.3 4.4  --   --   HGB 7.6* 9.0* 8.4* 9.0* 9.0* 7.8* 8.8*  HCT 25.0* 28.1* 27.4* 29.4* 28.8* 23.0* 26.0*  MCV 89.0 85.4 87.8 87.5 86.7  --   --   PLT 145* 144* 139* 155 151  --   --    Cardiac Enzymes: Recent Labs  Lab 04/23/23 0910  CKTOTAL 68   BNP: Invalid input(s): "POCBNP" CBG: Recent Labs  Lab 04/24/23 2125 04/24/23 2310 04/25/23 0311 04/25/23 0744 04/25/23 1306  GLUCAP 92 113* 100* 119* 155*   HbA1C: No results for input(s): "HGBA1C" in the last 72 hours. Urine analysis:    Component Value Date/Time   COLORURINE YELLOW 04/21/2023 0726   APPEARANCEUR CLOUDY (A) 04/21/2023 0726   LABSPEC 1.006 04/21/2023 0726   PHURINE 5.0 04/21/2023 0726   GLUCOSEU NEGATIVE 04/21/2023 0726   HGBUR NEGATIVE  04/21/2023 0726   BILIRUBINUR NEGATIVE 04/21/2023 0726   KETONESUR NEGATIVE 04/21/2023 0726   PROTEINUR NEGATIVE 04/21/2023 0726   UROBILINOGEN 0.2 12/17/2011 2258   NITRITE NEGATIVE 04/21/2023 0726   LEUKOCYTESUR NEGATIVE 04/21/2023 0726   Sepsis Labs: @LABRCNTIP (procalcitonin:4,lacticidven:4) ) Recent Results (from the past 240 hour(s))  Respiratory (~20 pathogens) panel by PCR     Status: None   Collection Time: 04/23/23  3:13 AM   Specimen: Nasopharyngeal Swab; Respiratory  Result Value Ref Range Status   Adenovirus NOT DETECTED NOT DETECTED Final   Coronavirus 229E NOT DETECTED NOT DETECTED Final    Comment: (NOTE) The Coronavirus on the Respiratory Panel, DOES NOT test for the novel  Coronavirus (2019 nCoV)    Coronavirus HKU1 NOT DETECTED NOT DETECTED Final   Coronavirus NL63 NOT DETECTED NOT DETECTED Final   Coronavirus OC43 NOT DETECTED NOT DETECTED Final   Metapneumovirus NOT DETECTED NOT DETECTED Final   Rhinovirus / Enterovirus NOT DETECTED NOT DETECTED Final   Influenza A NOT DETECTED NOT DETECTED Final   Influenza B NOT DETECTED NOT DETECTED Final   Parainfluenza Virus 1 NOT DETECTED NOT DETECTED Final   Parainfluenza Virus 2 NOT DETECTED NOT DETECTED Final   Parainfluenza Virus 3 NOT DETECTED NOT DETECTED Final   Parainfluenza Virus 4 NOT DETECTED NOT DETECTED Final   Respiratory Syncytial Virus NOT DETECTED NOT DETECTED Final   Bordetella pertussis NOT DETECTED NOT DETECTED Final   Bordetella Parapertussis NOT DETECTED NOT DETECTED Final   Chlamydophila pneumoniae NOT DETECTED NOT DETECTED Final   Mycoplasma pneumoniae NOT DETECTED NOT DETECTED Final    Comment: Performed at [facility], [address], [place], [place] [address]  SARS Coronavirus 2 by RT PCR (hospital order, performed in [facility] hospital lab) *cepheid single result test* Anterior Nasal Swab     Status: None   Collection Time: 04/23/23  5:31 PM   Specimen: Anterior Nasal Swab   Result Value Ref Range Status   SARS Coronavirus 2 by RT PCR NEGATIVE NEGATIVE Final    Comment: (NOTE) SARS-CoV-2 target nucleic acids are NOT DETECTED.  The SARS-CoV-2 RNA is generally detectable in upper and lower respiratory specimens during the acute phase of infection. The lowest concentration of SARS-CoV-2 viral copies this assay can detect is 250 copies / mL. A  negative result does not preclude SARS-CoV-2 infection and should not be used as the sole basis for treatment or other patient management decisions.  A negative result may occur with improper specimen collection / handling, submission of specimen other than nasopharyngeal swab, presence of viral mutation(s) within the areas targeted by this assay, and inadequate number of viral copies (<250 copies / mL). A negative result must be combined with clinical observations, patient history, and epidemiological information.  Fact Sheet for Patients:   RoadLapTop.co.za  Fact Sheet for Healthcare Providers: http://kim-miller.com/  This test is not yet approved or  cleared by the [place] FDA and has been authorized for detection and/or diagnosis of SARS-CoV-2 by FDA under an Emergency Use Authorization (EUA).  This EUA will remain in effect (meaning this test can be used) for the duration of the COVID-19 declaration under Section 564(b)(1) of the Act, 21 U.S.C. section 360bbb-3(b)(1), unless the authorization is terminated or revoked sooner.  Performed at [facility], [address] [place], [place] [address]      Scheduled Meds:  aspirin EC  81 mg Oral Daily   atorvastatin  80 mg Oral q morning   carvedilol  12.5 mg Oral BID WC   clonazePAM  0.5 mg Oral TID   clopidogrel  75 mg Oral Daily   darbepoetin (ARANESP) injection - NON-DIALYSIS  60 mcg Subcutaneous Q Fri-1800   furosemide  80 mg Intravenous Once   gabapentin  300 mg Oral QHS   insulin aspart  0-15 Units  Subcutaneous TID WC   insulin aspart  14 Units Subcutaneous TID WC   insulin detemir  50 Units Subcutaneous BID   isosorbide mononitrate  60 mg Oral q morning   lamoTRIgine  100 mg Oral BID   multivitamin with minerals  1 tablet Oral Daily   polyethylene glycol  17 g Oral BID   senna-docusate  2 tablet Oral QHS   sertraline  100 mg Oral Daily   sodium chloride flush  3 mL Intravenous Q12H   Continuous Infusions:  sodium chloride     ferric gluconate (FERRLECIT) IVPB      Procedures/Studies: CARDIAC CATHETERIZATION  Result Date: 04/25/2023   Hemodynamic findings consistent with mild pulmonary hypertension. Mild elevation of right heart pressures   CT RENAL STONE STUDY  Result Date: 04/22/2023 CLINICAL DATA:  Left flank pain, nausea EXAM: CT ABDOMEN AND PELVIS WITHOUT CONTRAST TECHNIQUE: Multidetector CT imaging of the abdomen and pelvis was performed following the standard protocol without IV contrast. RADIATION DOSE REDUCTION: This exam was performed according to the departmental dose-optimization program which includes automated exposure control, adjustment of the mA and/or kV according to patient size and/or use of iterative reconstruction technique. COMPARISON:  04/18/2023 FINDINGS: Lower chest: Interval development of bibasilar peribronchovascular ground-glass pulmonary infiltrates likely related to multifocal infection or noncardiogenic pulmonary edema. Trace bilateral pleural effusions. Extensive multi-vessel coronary artery calcification. Global cardiac size within normal limits. Hepatobiliary: No focal liver abnormality is seen. Status post cholecystectomy. No biliary dilatation. Pancreas: Unremarkable. No pancreatic ductal dilatation or surrounding inflammatory changes. Spleen: Mild splenomegaly is stable with the spleen measuring 15.6 cm in greatest dimension. This appears slightly progressive since remote prior examination 10/07/2022. No intrasplenic lesions identified on this  noncontrast examination. Adrenals/Urinary Tract: The adrenal glands are unremarkable. The kidneys are normal in size and position. 1-2 mm punctate nonobstructing calculi are noted within the interpolar and lower polar regions of the kidneys bilaterally. No hydronephrosis. Minimal nonspecific bilateral perinephric stranding. No perinephric fluid  collections. No hydronephrosis. No ureteral calculi. The bladder is unremarkable. Stomach/Bowel: Moderate colonic stool burden without evidence of obstruction. Status post subtotal appendectomy. The stomach, small bowel, and large bowel are otherwise unremarkable. No evidence of obstruction or focal inflammation. No free intraperitoneal gas or fluid. Vascular/Lymphatic: Aortic atherosclerosis. No enlarged abdominal or pelvic lymph nodes. Reproductive: Status post hysterectomy. No adnexal masses. Other: No abdominal wall hernia. Musculoskeletal: Osseous structures are age-appropriate. No acute bone abnormality. No lytic or blastic bone lesion. IMPRESSION: 1. Interval development of bibasilar peribronchovascular ground-glass pulmonary infiltrates likely related to multifocal infection or noncardiogenic pulmonary edema. Trace bilateral pleural effusions. 2. Extensive multi-vessel coronary artery calcification. 3. Mild splenomegaly, progressive since remote prior examination of 10/07/2022. 4. Minimal bilateral nonobstructing nephrolithiasis. No urolithiasis. No hydronephrosis. 5. Moderate colonic stool burden without evidence of obstruction. 6. Aortic atherosclerosis. Aortic Atherosclerosis (ICD10-I70.0). Electronically Signed   By: Helyn Numbers M.D.   On: 04/22/2023 19:45   ECHOCARDIOGRAM LIMITED  Result Date: 04/22/2023    ECHOCARDIOGRAM LIMITED REPORT   Patient Name:   [name] Ow Date of Exam: 04/22/2023 Medical Rec #:  [id number]      Height:       63.0 in Accession #:    [id number]     Weight:       245.0 lb Date of Birth:  [birth year]      BSA:          2.108 m Patient  Age:    61 years       BP:           117/62 mmHg Patient Gender: F              HR:           67 bpm. [facility] Procedure: Limited Echo, Cardiac Doppler and Color Doppler Indications:    CHF                 IVC check  History:        Patient has prior history of Echocardiogram examinations, most                 recent 04/17/2023. CHF, CAD and Angina, Arrythmias:Atrial                 Fibrillation, Signs/Symptoms:Chest Pain; Risk                 Factors:Hypertension, Diabetes and Dyslipidemia. CKD.  Sonographer:    Mikki Harbor Referring Phys: [id number] VISHNU P MALLIPEDDI  Sonographer Comments: Patient is obese. IMPRESSIONS  1. Limited Echo for IVC check.  2. Left ventricular ejection fraction, by estimation, is 60 to 65%. The left ventricle has normal function. Left ventricular diastolic function could not be evaluated.  3. Right ventricular systolic function is normal. The right ventricular size is normal. There is normal pulmonary artery systolic pressure.  4. The inferior vena cava is normal in size with <50% respiratory variability, suggesting right atrial pressure of 8 mmHg. FINDINGS  Left Ventricle: Left ventricular ejection fraction, by estimation, is 60 to 65%. The left ventricle has normal function. The left ventricular internal cavity size was normal in size. There is no left ventricular hypertrophy. Left ventricular diastolic function could not be evaluated. Right Ventricle: The right ventricular size is normal. Right ventricular systolic function is normal. There is normal pulmonary artery systolic pressure. The tricuspid regurgitant velocity is 2.52 m/s, and with an assumed right atrial pressure of 8 mmHg,  the estimated  right ventricular systolic pressure is 33.4 mmHg. Left Atrium: Left atrial size was not assessed. Right Atrium: Right atrial size was not assessed. Pericardium: There is no evidence of pericardial effusion. Mitral Valve: The mitral valve is normal in structure.  Tricuspid Valve: The tricuspid valve is normal in structure. Tricuspid valve regurgitation is trivial. Aortic Valve: The aortic valve is tricuspid. There is mild calcification of the aortic valve. There is mild thickening of the aortic valve. There is mild aortic valve annular calcification. Pulmonic Valve: The pulmonic valve was normal in structure. Aorta: The aortic root is normal in size and structure. Venous: The inferior vena cava is normal in size with less than 50% respiratory variability, suggesting right atrial pressure of 8 mmHg. IAS/Shunts: The interatrial septum was not assessed. LEFT VENTRICLE PLAX 2D LVIDd:         4.70 cm LVIDs:         3.00 cm LV PW:         1.20 cm LV IVS:        1.10 cm LVOT diam:     2.10 cm LVOT Area:     3.46 cm  LEFT ATRIUM         Index LA diam:    4.40 cm 2.09 cm/m   AORTA Ao Root diam: 3.10 cm TRICUSPID VALVE TR Peak grad:   25.4 mmHg TR Vmax:        252.00 cm/s  SHUNTS Systemic Diam: 2.10 cm Vishnu Priya Mallipeddi Electronically signed by Winfield Rast Mallipeddi Signature Date/Time: 04/22/2023/5:07:38 PM    Final    DG CHEST PORT 1 VIEW  Result Date: 04/21/2023 CLINICAL DATA:  Chest pain EXAM: PORTABLE CHEST 1 VIEW COMPARISON:  04/20/2023 FINDINGS: The heart size and mediastinal contours are within normal limits. Both lungs are clear. The visualized skeletal structures are unremarkable. IMPRESSION: No active disease. Electronically Signed   By: Charlett Nose M.D.   On: 04/21/2023 16:58   DG CHEST PORT 1 VIEW  Result Date: 04/20/2023 CLINICAL DATA:  [id number] Chest pain [id number] EXAM: PORTABLE CHEST - 1 VIEW COMPARISON:  04/15/2023 FINDINGS: Relatively low lung volumes with some crowding of bronchovascular structures centrally. No new infiltrate. No pneumothorax. Heart size and mediastinal contours are within normal limits. No effusion. Visualized bones unremarkable. IMPRESSION: Low lung volumes. No acute findings. Electronically Signed   By: Corlis Leak M.D.   On:  04/20/2023 10:03   US RENAL  Result Date: 04/19/2023 CLINICAL DATA:  Acute on chronic kidney injury EXAM: RENAL / URINARY TRACT ULTRASOUND COMPLETE COMPARISON:  CT abdomen pelvis 04/18/2023 FINDINGS: Right Kidney: Renal measurements: 9.4 x 4.9 x 4.4 cm = volume: 105 mL. Echogenicity within normal limits. No mass or hydronephrosis visualized. Left Kidney: Renal measurements: 11.6 x 5.6 x 5.0 cm = volume: 169 mL. Echogenicity within normal limits. No mass or hydronephrosis visualized. Bladder: Limited evaluation due to to collapsed configuration. Other: Diffuse increased echogenicity of the visualized portions of the hepatic parenchyma are a nonspecific indicator of hepatocellular dysfunction, most commonly steatosis. IMPRESSION: No significant sonographic abnormality of the kidneys. Electronically Signed   By: Acquanetta Belling M.D.   On: 04/19/2023 10:55   CT ABDOMEN PELVIS WO CONTRAST  Result Date: 04/18/2023 CLINICAL DATA:  Right lower quadrant abdominal pain EXAM: CT ABDOMEN AND PELVIS WITHOUT CONTRAST TECHNIQUE: Multidetector CT imaging of the abdomen and pelvis was performed following the standard protocol without IV contrast. RADIATION DOSE REDUCTION: This exam was performed according to the departmental dose-optimization program which  includes automated exposure control, adjustment of the mA and/or kV according to patient size and/or use of iterative reconstruction technique. COMPARISON:  None Available. FINDINGS: Lower chest: No acute findings. Coronary artery and aortic calcifications. Hepatobiliary: Prior cholecystectomy. Diffuse fatty infiltration of the liver. No focal hepatic abnormality. Mild hepatomegaly with craniocaudal length 23 cm. Pancreas: No focal abnormality or ductal dilatation. Spleen: Splenomegaly with a craniocaudal length of 14.5 cm. This is stable since prior study. No focal abnormality. Adrenals/Urinary Tract: Punctate bilateral nonobstructing renal calculi. No ureteral stones or  hydronephrosis. Adrenal glands and urinary bladder unremarkable. Stomach/Bowel: Stomach, large and small bowel grossly unremarkable. Vascular/Lymphatic: Aortic atherosclerosis. No evidence of aneurysm or adenopathy. Reproductive: Prior hysterectomy.  No adnexal masses. Other: No free fluid or free air. Musculoskeletal: No acute bony abnormality. IMPRESSION: Hepatic steatosis.  Hepatosplenomegaly. No acute findings in the abdomen or pelvis. Aortic atherosclerosis, coronary artery disease. Punctate bilateral nephrolithiasis.  No hydronephrosis. Electronically Signed   By: Charlett Nose M.D.   On: 04/18/2023 19:22   ECHOCARDIOGRAM COMPLETE  Result Date: 04/17/2023    ECHOCARDIOGRAM REPORT   Patient Name:   [name] [place] Date of Exam: 04/17/2023 Medical Rec #:  [id number]      Height:       63.0 in Accession #:    [id number]     Weight:       245.0 lb Date of Birth:  02-19-1961      BSA:          2.108 m Patient Age:    61 years       BP:           136/73 mmHg Patient Gender: F              HR:           71 bpm. [facility] Procedure: 2D Echo, Cardiac Doppler and Color Doppler Indications:    Chest Pain R07.9  History:        Patient has no prior history of Echocardiogram examinations.                 CAD, Arrythmias:Atrial Fibrillation, Signs/Symptoms:Chest Pain;                 Risk Factors:Dyslipidemia, Hypertension and Diabetes.  Sonographer:    Aron Baba Referring Phys: [id number] Ellsworth Lennox  Sonographer Comments: Patient is obese. IMPRESSIONS  1. Left ventricular ejection fraction, by estimation, is 55 to 60%. The left ventricle has normal function. The left ventricle has no regional wall motion abnormalities. Left ventricular diastolic parameters are consistent with Grade I diastolic dysfunction (impaired relaxation).  2. Right ventricular systolic function is normal. The right ventricular size is normal. Tricuspid regurgitation signal is inadequate for assessing PA pressure.  3. The mitral  valve is abnormal. Mild mitral valve regurgitation. No evidence of mitral stenosis.  4. The aortic valve is tricuspid. Aortic valve regurgitation is not visualized. No aortic stenosis is present.  5. Aortic dilatation noted. 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  6. The inferior vena cava is normal in size with greater than 50% respiratory variability, suggesting right atrial pressure of 3 mmHg. FINDINGS  Left Ventricle: Left ventricular ejection fraction, by estimation, is 55 to 60%. The left ventricle has normal function. The left ventricle has no regional wall motion abnormalities. The left ventricular internal cavity size was normal in size. There is  no left ventricular hypertrophy. Left ventricular diastolic parameters  are consistent with Grade I diastolic dysfunction (impaired relaxation). Normal left ventricular filling pressure. Right Ventricle: The right ventricular size is normal. Right vetricular wall thickness was not well visualized. Right ventricular systolic function is normal. Tricuspid regurgitation signal is inadequate for assessing PA pressure. Left Atrium: Left atrial size was normal in size. Right Atrium: Right atrial size was normal in size. Pericardium: There is no evidence of pericardial effusion. Mitral Valve: The mitral valve is abnormal. Mild mitral valve regurgitation. No evidence of mitral valve stenosis. Tricuspid Valve: The tricuspid valve is normal in structure. Tricuspid valve regurgitation is trivial. No evidence of tricuspid stenosis. Aortic Valve: The aortic valve is tricuspid. Aortic valve regurgitation is not visualized. No aortic stenosis is present. Aortic valve mean gradient measures 5.4 mmHg. Aortic valve peak gradient measures 8.7 mmHg. Aortic valve area, by VTI measures 1.85 cm. Pulmonic Valve: The pulmonic valve was not well visualized. Pulmonic valve regurgitation is not visualized. No evidence of pulmonic stenosis. Aorta: The aortic root is  normal in size and structure and aortic dilatation noted. 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 Venous: The inferior vena cava is normal in size with greater than 50% respiratory variability, suggesting right atrial pressure of 3 mmHg. IAS/Shunts: No atrial level shunt detected by color flow Doppler.  LEFT VENTRICLE PLAX 2D LVIDd:         5.00 cm   Diastology LVIDs:         3.60 cm   LV e' medial:    13.20 cm/s LV PW:         0.90 cm   LV E/e' medial:  5.8 LV IVS:        0.80 cm   LV e' lateral:   6.22 cm/s LVOT diam:     2.00 cm   LV E/e' lateral: 12.3 LV SV:         67 LV SV Index:   32 LVOT Area:     3.14 cm  RIGHT VENTRICLE RV S prime:     12.60 cm/s TAPSE (M-mode): 2.2 cm LEFT ATRIUM             Index        RIGHT ATRIUM           Index LA diam:        4.00 cm 1.90 cm/m   RA Area:     10.10 cm LA Vol (A2C):   69.3 ml 32.88 ml/m  RA Volume:   20.40 ml  9.68 ml/m LA Vol (A4C):   65.9 ml 31.26 ml/m LA Biplane Vol: 70.8 ml 33.59 ml/m  AORTIC VALVE AV Area (Vmax):    1.94 cm AV Area (Vmean):   1.69 cm AV Area (VTI):     1.85 cm AV Vmax:           147.81 cm/s AV Vmean:          109.081 cm/s AV VTI:            0.360 m AV Peak Grad:      8.7 mmHg AV Mean Grad:      5.4 mmHg LVOT Vmax:         91.34 cm/s LVOT Vmean:        58.622 cm/s LVOT VTI:          0.212 m LVOT/AV VTI ratio: 0.59  AORTA Ao Root diam: 3.40 cm Ao Asc diam:  3.60 cm MITRAL VALVE MV Area (PHT): 4.71  cm     SHUNTS MV Decel Time: 161 msec     Systemic VTI:  0.21 m MR Peak grad: 82.2 mmHg     Systemic Diam: 2.00 cm MR Vmax:      453.33 cm/s MV E velocity: 76.60 cm/s MV A velocity: 108.00 cm/s MV E/A ratio:  0.71 Dina Rich MD Electronically signed by Dina Rich MD Signature Date/Time: 04/17/2023/1:33:26 PM    Final    CT CHEST ABDOMEN PELVIS WO CONTRAST  Result Date: 04/16/2023 CLINICAL DATA:  Sharp chest pain radiating to back. Left-sided rib pain and right-sided abdominal pain. EXAM: CT CHEST, ABDOMEN  AND PELVIS WITHOUT CONTRAST TECHNIQUE: Multidetector CT imaging of the chest, abdomen and pelvis was performed following the standard protocol without IV contrast. RADIATION DOSE REDUCTION: This exam was performed according to the departmental dose-optimization program which includes automated exposure control, adjustment of the mA and/or kV according to patient size and/or use of iterative reconstruction technique. COMPARISON:  10/07/2022. FINDINGS: CT CHEST FINDINGS Cardiovascular: The heart is normal in size and there is no pericardial effusion. Three-vessel coronary artery calcifications are noted. There is atherosclerotic calcification of the aorta without evidence of aneurysm. The pulmonary trunk is normal in caliber. Mediastinum/Nodes: No mediastinal or hilar lymphadenopathy. Evaluation of the hila is limited due to lack of IV contrast. Coarse calcification is noted in the left lobe of the thyroid gland. The trachea and esophagus are within normal limits. Lungs/Pleura: Mild atelectasis is present bilaterally. No effusion or pneumothorax. Small calcified granuloma are present bilaterally. Musculoskeletal: Degenerative changes are present in the thoracic spine. No acute fracture is seen. CT ABDOMEN PELVIS FINDINGS Hepatobiliary: No focal liver abnormality is seen. The liver is mildly enlarged. No gallstones, gallbladder wall thickening, or biliary dilatation. Pancreas: Unremarkable. No pancreatic ductal dilatation or surrounding inflammatory changes. Spleen: The spleen is enlarged at 14.1 cm in length. No focal abnormality. Adrenals/Urinary Tract: The adrenal glands are within normal limits. A nonobstructive renal calculus is noted on the right. No hydroureteronephrosis bilaterally. The bladder is unremarkable. Stomach/Bowel: Stomach is within normal limits. Appendix is surgically absent. No evidence of bowel wall thickening, distention, or inflammatory changes. No free air or pneumatosis. Vascular/Lymphatic:  Aortic atherosclerosis. No enlarged abdominal or pelvic lymph nodes. Reproductive: Status post hysterectomy. No adnexal masses. Other: No abdominopelvic ascites. Subcutaneous fat stranding is noted in the anterior abdominal wall in the left lower quadrant, unchanged from the prior exam. Musculoskeletal: Degenerative changes are present in the lumbar spine. No acute fracture. IMPRESSION: 1. No acute process in the chest, abdomen, or pelvis. 2. Nonobstructive right renal calculus. 3. Mild hepatosplenomegaly. 4. Subcutaneous fat stranding in the anterior abdominal wall in the left lower quadrant, unchanged from the prior exam, possible cellulitis. 5. Coronary artery calcifications. 6. Aortic atherosclerosis. Electronically Signed   By: Thornell Sartorius M.D.   On: 04/16/2023 00:19   DG Chest 2 View  Result Date: 04/15/2023 CLINICAL DATA:  Left-sided chest pain for 2 days EXAM: CHEST - 2 VIEW COMPARISON:  03/30/2023 FINDINGS: Frontal and lateral views of the chest demonstrate an unremarkable cardiac silhouette. No airspace disease, effusion, or pneumothorax. No acute bony abnormality. IMPRESSION: 1. No acute intrathoracic process. Electronically Signed   By: Sharlet Salina M.D.   On: 04/15/2023 22:33   DG Chest 2 View  Result Date: 03/30/2023 CLINICAL DATA:  Chest pain EXAM: CHEST - 2 VIEW COMPARISON:  02/08/2023 FINDINGS: The heart size and mediastinal contours are within normal limits. Both lungs are clear. The visualized skeletal  structures are unremarkable. IMPRESSION: No active cardiopulmonary disease. Electronically Signed   By: Ernie Avena M.D.   On: 03/30/2023 19:01    Barnetta Chapel, MD  [facility]  If 7PM-7AM, please contact night-coverage [email or web address] Password [email or web address] 04/25/2023, 3:11 PM   LOS: 8 days

## 2023-04-25 NOTE — Progress Notes (Addendum)
Patient reported to RN that she is having vision changes to left eye. Reports burning in eye and that she is "seeing spots" and "seeing stars" at times. States she is not having any changes in her right eye and denies any other new symptoms. RN notified Dr. Dartha Lodge, code stroke activated, rapid response RN to bedside.

## 2023-04-26 DIAGNOSIS — R0789 Other chest pain: Secondary | ICD-10-CM | POA: Diagnosis not present

## 2023-04-26 LAB — GLUCOSE, CAPILLARY
Glucose-Capillary: 129 mg/dL — ABNORMAL HIGH (ref 70–99)
Glucose-Capillary: 108 mg/dL — ABNORMAL HIGH (ref 70–99)
Glucose-Capillary: 211 mg/dL — ABNORMAL HIGH (ref 70–99)
Glucose-Capillary: 166 mg/dL — ABNORMAL HIGH (ref 70–99)
Glucose-Capillary: 151 mg/dL — ABNORMAL HIGH (ref 70–99)

## 2023-04-26 MED ORDER — FUROSEMIDE 40 MG PO TABS: 40.0000 mg | ORAL_TABLET | Freq: Two times a day (BID) | ORAL | Status: DC

## 2023-04-26 MED ORDER — ENOXAPARIN SODIUM 120 MG/0.8ML IJ SOSY
120.0000 mg | PREFILLED_SYRINGE | INTRAMUSCULAR | Status: DC
  Administered 2023-04-26 – 2023-04-30 (×5): 120 mg via SUBCUTANEOUS
  Filled 2023-04-26 (×6): qty 0.8

## 2023-04-26 MED ORDER — FUROSEMIDE 10 MG/ML IJ SOLN: 80.0000 mg | Freq: Once | INTRAMUSCULAR | Status: DC

## 2023-04-26 MED ORDER — FUROSEMIDE 40 MG PO TABS
40.0000 mg | ORAL_TABLET | Freq: Two times a day (BID) | ORAL | Status: DC
  Administered 2023-04-26: 40 mg via ORAL
  Filled 2023-04-26: qty 1

## 2023-04-26 NOTE — Progress Notes (Cosign Needed Addendum)
ANTICOAGULATION CONSULT NOTE - Initial Consult  Pharmacy Consult for lovenox Indication: atrial fibrillation  Allergies  Allergen Reactions   Solu-Medrol [Methylprednisolone Sodium Succ] Shortness Of Breath   Contrast Media [Iodinated Contrast Media] Itching and Swelling   Doxycycline Hives   Ibuprofen Other (See Comments)    GI upset   Keflex [Cephalexin] Nausea And Vomiting   Metformin And Related Nausea Only   Nitroglycerin Nausea And Vomiting   Nsaids Other (See Comments)    Stomach pain/bleeding   Shellfish Allergy Itching and Swelling   Sulfa Antibiotics Nausea And Vomiting   Toradol [Ketorolac Tromethamine] Hives   Bentyl [Dicyclomine Hcl] Anxiety   Blueberry Flavor Nausea And Vomiting and Rash   Nitrofurantoin Monohyd Macro Itching   Strawberry Flavor Nausea And Vomiting and Rash    Patient Measurements: Height: 5\' 3"  (160 cm) Weight: 114.2 kg (251 lb 12.8 oz) IBW/kg (Calculated) : 52.4   Vital Signs: Temp: 98 F (36.7 C) (08/10 0351) Temp Source: Oral (08/10 0351) BP: 139/57 (08/10 0351) Pulse Rate: 59 (08/10 0351)  Labs: Recent Labs    04/24/23 0447 04/24/23 0833 04/24/23 1221 04/24/23 1436 04/25/23 0712 04/25/23 1402 04/25/23 1404 04/26/23 0238  HGB 9.0*  --   --   --  9.0* 7.8* 8.8* 8.6*  HCT 29.4*  --   --   --  28.8* 23.0* 26.0* 27.0*  PLT 155  --   --   --  151  --   --  161  HEPARINUNFRC  --  0.39  --   --   --   --   --   --   CREATININE 2.56*  --   --   --  2.52*  --   --  2.62*  TROPONINIHS  --   --  4 4  --   --   --   --     Estimated Creatinine Clearance: 27.8 mL/min (A) (by C-G formula based on SCr of 2.62 mg/dL (H)).   Medical History: Past Medical History:  Diagnosis Date   Asthma    Atrial fibrillation (HCC)    Bipolar 1 disorder (HCC)    Chronic back pain    Chronic chest pain    Diabetes mellitus without complication (HCC)    Gastroparesis    GERD (gastroesophageal reflux disease)    Hyperlipemia    Hypertension     IBS (irritable bowel syndrome)    Normal cardiac stress test 02/2014   [facility]    Medications:  Scheduled:   aspirin EC  81 mg Oral Daily   atorvastatin  80 mg Oral q morning   carvedilol  12.5 mg Oral BID WC   clonazePAM  0.5 mg Oral TID   clopidogrel  75 mg Oral Daily   darbepoetin (ARANESP) injection - NON-DIALYSIS  60 mcg Subcutaneous Q Fri-1800   furosemide  40 mg Oral BID   gabapentin  300 mg Oral QHS   insulin aspart  0-15 Units Subcutaneous TID WC   insulin aspart  14 Units Subcutaneous TID WC   insulin detemir  50 Units Subcutaneous BID   isosorbide mononitrate  60 mg Oral q morning   lamoTRIgine  100 mg Oral BID   multivitamin with minerals  1 tablet Oral Daily   polyethylene glycol  17 g Oral BID   senna-docusate  2 tablet Oral QHS   sertraline  100 mg Oral Daily   sodium chloride flush  3 mL Intravenous Q12H   Infusions:  sodium chloride     ferric gluconate (FERRLECIT) IVPB Stopped (04/25/23 1933)    Assessment: 62 yo female pt with hx of NSTEMI and DES-RCA (2022). Pt received RHC on 8/9, plan to do LHC on 8/12. Eliquis 5 mg BID prior to admission for afib. Pharmacy being consulted to dose lovenox for afib in setting of plans for [facility] on Monday.   Goal of Therapy:  Monitor platelets by anticoagulation protocol: Yes   Plan:  Start lovenox 120 mg SQ daily (CrCl < 30)  Monitor CBC and s/sx of bleeding  Monitor renal function (potential need to increase dose) F/u LHC plans    I  04/26/2023,11:02 AM

## 2023-04-26 NOTE — Progress Notes (Signed)
Progress Note  Patient Name: [name] Date of Encounter: 04/26/2023 Primary Cardiologist: Marjo Bicker, MD   Subjective   Overnight breathing has improved. Patient notes that she has talked with nephrology, if she has a CIN, she notes that she is planned for dialysis. Still has chest pain, breathing has improved.  Vital Signs    Vitals:   04/25/23 1639 04/25/23 1829 04/25/23 1945 04/26/23 0351  BP: 117/64 (!) 124/53 (!) 129/47 (!) 139/57  Pulse: 63  63 (!) 59  Resp:   18 18  Temp:   98.2 F (36.8 C) 98 F (36.7 C)  TempSrc:   Oral Oral  SpO2: 93%  95% 93%  Weight:      Height:        Intake/Output Summary (Last 24 hours) at 04/26/2023 0728 Last data filed at 04/26/2023 0402 Gross per 24 hour  Intake 510.05 ml  Output 4000 ml  Net -3489.95 ml   Filed Weights   04/16/23 1344 04/24/23 0426 04/25/23 0558  Weight: 111.1 kg 115.9 kg 114.2 kg    Physical Exam   GEN: No acute distress.   Neck:  JVD+ Cardiac: RRR, no  rubs, or gallops. Systolic murmur Respiratory: Decrease breath sounds in bases GI: Soft, nontender, non-distended  MS: No edema  Labs   Telemetry: SR   Chemistry Recent Labs  Lab 04/24/23 0447 04/25/23 0712 04/25/23 1402 04/25/23 1404 04/26/23 0238  NA 138 139 142 138 133*  K 4.7 4.2 3.9 4.3 3.6  CL 102 101  --   --  96*  CO2 25 25  --   --  26  GLUCOSE 187* 113*  --   --  126*  BUN 23* 22*  --   --  27*  CREATININE 2.56* 2.52*  --   --  2.62*  CALCIUM 8.5* 8.9  --   --  8.7*  ALBUMIN 2.9* 2.7*  --   --  2.8*  GFRNONAA 21* 21*  --   --  20*  ANIONGAP 11 13  --   --  11     Hematology Recent Labs  Lab 04/24/23 0447 04/25/23 0712 04/25/23 1402 04/25/23 1404 04/26/23 0238  WBC 4.3 4.4  --   --  4.6  RBC 3.36* 3.32*  --   --  3.12*  HGB 9.0* 9.0* 7.8* 8.8* 8.6*  HCT 29.4* 28.8* 23.0* 26.0* 27.0*  MCV 87.5 86.7  --   --  86.5  MCH 26.8 27.1  --   --  27.6  MCHC 30.6 31.3  --   --  31.9  RDW 17.6* 17.6*  --   --   17.3*  PLT 155 151  --   --  161    Cardiac EnzymesNo results for input(s): "TROPONINI" in the last 168 hours. No results for input(s): "TROPIPOC" in the last 168 hours.   BNP Recent Labs  Lab 04/21/23 1256 04/22/23 0432  BNP 183.0* 297.0*     DDimer No results for input(s): "DDIMER" in the last 168 hours.   Cardiac Studies   Cardiac Studies & Procedures   CARDIAC CATHETERIZATION  CARDIAC CATHETERIZATION 04/25/2023  Narrative   Hemodynamic findings consistent with mild pulmonary hypertension.  Mild elevation of right heart pressures     ECHOCARDIOGRAM  ECHOCARDIOGRAM LIMITED 04/22/2023  Narrative ECHOCARDIOGRAM LIMITED REPORT    Patient Name:   [name] Date of Exam: 04/22/2023 Medical Rec #:  [id number]      Height:  63.0 in Accession #:    [id number]     Weight:       245.0 lb Date of Birth:  07/26/1961      BSA:          2.108 m Patient Age:    61 years       BP:           117/62 mmHg Patient Gender: F              HR:           67 bpm. [facility]  Procedure: Limited Echo, Cardiac Doppler and Color Doppler  Indications:    CHF IVC check  History:        Patient has prior history of Echocardiogram examinations, most recent 04/17/2023. CHF, CAD and Angina, Arrythmias:Atrial Fibrillation, Signs/Symptoms:Chest Pain; Risk Factors:Hypertension, Diabetes and Dyslipidemia. CKD.  Sonographer:    Mikki Harbor Referring Phys: [id number] VISHNU P MALLIPEDDI   Sonographer Comments: Patient is obese. IMPRESSIONS   1. Limited Echo for IVC check. 2. Left ventricular ejection fraction, by estimation, is 60 to 65%. The left ventricle has normal function. Left ventricular diastolic function could not be evaluated. 3. Right ventricular systolic function is normal. The right ventricular size is normal. There is normal pulmonary artery systolic pressure. 4. The inferior vena cava is normal in size with <50% respiratory variability, suggesting right  atrial pressure of 8 mmHg.  FINDINGS Left Ventricle: Left ventricular ejection fraction, by estimation, is 60 to 65%. The left ventricle has normal function. The left ventricular internal cavity size was normal in size. There is no left ventricular hypertrophy. Left ventricular diastolic function could not be evaluated.  Right Ventricle: The right ventricular size is normal. Right ventricular systolic function is normal. There is normal pulmonary artery systolic pressure. The tricuspid regurgitant velocity is 2.52 m/s, and with an assumed right atrial pressure of 8 mmHg, the estimated right ventricular systolic pressure is 33.4 mmHg.  Left Atrium: Left atrial size was not assessed.  Right Atrium: Right atrial size was not assessed.  Pericardium: There is no evidence of pericardial effusion.  Mitral Valve: The mitral valve is normal in structure.  Tricuspid Valve: The tricuspid valve is normal in structure. Tricuspid valve regurgitation is trivial.  Aortic Valve: The aortic valve is tricuspid. There is mild calcification of the aortic valve. There is mild thickening of the aortic valve. There is mild aortic valve annular calcification.  Pulmonic Valve: The pulmonic valve was normal in structure.  Aorta: The aortic root is normal in size and structure.  Venous: The inferior vena cava is normal in size with less than 50% respiratory variability, suggesting right atrial pressure of 8 mmHg.  IAS/Shunts: The interatrial septum was not assessed.  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 Acute on chronic HFpEF - has Po lasix written by nephrology, given volume status low threshold to return to IV lasix (80 mg BID) given RHC numbers  - no evidence of decreased cardiac output, no plan for inotropes   CAD  Hx of NSTEMI with DES-RCA 2022 ([place]) With chest pain worse with hypervolemia  - received 2.75 x 18 mm DES, remaining nonobstructive disease treated medically - has contrast allergy, has not had issues with nitrates - she is willing to proceed with heart cath even if she needs HD after - continue DAPT for now, do not plan for triple therapy unless intervention is performed - continue Coreg, imdur, and statin   Consent for LHC- not added to the board yet volume status, creatinine  Paroxymal atrial fibrillation Chronic anticoagulation - maintaining sinus rhythm on coreg - lovenox today (AF indication), and may get LHC on Monday     For questions or updates, please contact [facility] Please consult [email or web address] for contact info under Cardiology/STEMI.      Riley Lam, MD FASE [facility] Cardiologist [facility]  [address] [place], [address] [place], [place] [address] [phone]  7:28 AM

## 2023-04-26 NOTE — Progress Notes (Signed)
Subjective:  Had her right heart cath yesterday -  indicated volume overload so was diuresed-  had 4 L of UOP -  BUN and crt just a little higher-  says that she still feels terrible-  had code stroke called last night due to vision change-  HCT negative  Objective Vital signs in last 24 hours: Vitals:   04/25/23 1639 04/25/23 1829 04/25/23 1945 04/26/23 0351  BP: 117/64 (!) 124/53 (!) 129/47 (!) 139/57  Pulse: 63  63 (!) 59  Resp:   18 18  Temp:   98.2 F (36.8 C) 98 F (36.7 C)  TempSrc:   Oral Oral  SpO2: 93%  95% 93%  Weight:      Height:       Weight change:   Intake/Output Summary (Last 24 hours) at 04/26/2023 8242 Last data filed at 04/26/2023 0402 Gross per 24 hour  Intake 510.05 ml  Output 4000 ml  Net -3489.95 ml    Assessment/ Plan: Pt is a 62 y.o. yo female with longstanding DM and HTN who was admitted on 04/15/2023 with CP  Assessment/Plan: 1. Renal-  baseline crt 1.1 to 1.4-  ( was 1.49 on 03/30/23) , on 7/30 1.9 upon admission and then progressed slowly to 2.5 on 8/7 and has really stayed there since.  UA bland and US unremarkable-  occasional lower BPs noted during course but not on ACE/ARB.  The plan was in place to get renal artery duplex for further workup.  There are no indications for dialysis so no plans for such-   she is nonoliguric.  Will check orthostatics to see if there happen to be some covert low BPS 2. CV -  right heart cath yesterday c/w some pulmonary HTN and volume overload-  has been diuresed and tolerated it well -  seems like cards is not too interested in pursuing left heart cath at this time-  for nuclear stress.  If cards felt strongly about pursuing left heart cath we would do whatever we needed to do -  not sure that she would require dialysis even then.  Difficult situation as I am not sure she will ever say that she feels good  3. Anemia-  is not helping things-  have ordered iron and ESA  5. HTN/volume-  coreg 12.5 BID and imdur 60-  given  lasix yesterday -  can order some today chronically 40 BID  Cecille Aver    Labs: Basic Metabolic Panel: Recent Labs  Lab 04/24/23 0447 04/25/23 0712 04/25/23 1402 04/25/23 1404 04/26/23 0238  NA 138 139 142 138 133*  K 4.7 4.2 3.9 4.3 3.6  CL 102 101  --   --  96*  CO2 25 25  --   --  26  GLUCOSE 187* 113*  --   --  126*  BUN 23* 22*  --   --  27*  CREATININE 2.56* 2.52*  --   --  2.62*  CALCIUM 8.5* 8.9  --   --  8.7*  PHOS 4.6 5.1*  --   --  5.3*   Liver Function Tests: Recent Labs  Lab 04/24/23 0447 04/25/23 0712 04/26/23 0238  ALBUMIN 2.9* 2.7* 2.8*   No results for input(s): "LIPASE", "AMYLASE" in the last 168 hours. No results for input(s): "AMMONIA" in the last 168 hours. CBC: Recent Labs  Lab 04/22/23 0432 04/23/23 0439 04/24/23 0447 04/25/23 0712 04/25/23 1402 04/25/23 1404 04/26/23 0238  WBC 5.6 3.9* 4.3 4.4  --   --  4.6  HGB 9.0* 8.4* 9.0* 9.0* 7.8* 8.8* 8.6*  HCT 28.1* 27.4* 29.4* 28.8* 23.0* 26.0* 27.0*  MCV 85.4 87.8 87.5 86.7  --   --  86.5  PLT 144* 139* 155 151  --   --  161   Cardiac Enzymes: Recent Labs  Lab 04/23/23 0910  CKTOTAL 68   CBG: Recent Labs  Lab 04/25/23 1640 04/25/23 1828 04/25/23 2052 04/26/23 0354 04/26/23 0733  GLUCAP 173* 183* 175* 129* 108*    Iron Studies: No results for input(s): "IRON", "TIBC", "TRANSFERRIN", "FERRITIN" in the last 72 hours. Studies/Results: CT HEAD CODE STROKE WO CONTRAST  Result Date: 04/25/2023 CLINICAL DATA:  Code stroke.  TIA.  Acute visual changes. EXAM: CT HEAD WITHOUT CONTRAST TECHNIQUE: Contiguous axial images were obtained from the base of the skull through the vertex without intravenous contrast. RADIATION DOSE REDUCTION: This exam was performed according to the departmental dose-optimization program which includes automated exposure control, adjustment of the mA and/or kV according to patient size and/or use of iterative reconstruction technique. COMPARISON:  CT head  without contrast 02/08/2023 FINDINGS: Brain: No acute infarct, hemorrhage, or mass lesion is present. The ventricles are of normal size. No significant extraaxial fluid collection is present. The brainstem and cerebellum are within normal limits. Midline structures are within normal limits. Vascular: Atherosclerotic calcifications are present within the cavernous internal carotid arteries. No hyperdense vessel is present. Skull: Calvarium is intact. No focal lytic or blastic lesions are present. No significant extracranial soft tissue lesion is present. Sinuses/Orbits: Polyps are again noted in the left maxillary sinus. Increased mucosal thickening is present. The globes and orbits are within normal limits. IMPRESSION: 1. Normal CT appearance of the brain. 2. Increased mucosal thickening in the left maxillary sinus. The above was relayed via text pager to Dr. Viviann Spare on 04/25/2023 at 18:56 . Electronically Signed   By: Marin Roberts M.D.   On: 04/25/2023 18:56   CARDIAC CATHETERIZATION  Result Date: 04/25/2023   Hemodynamic findings consistent with mild pulmonary hypertension. Mild elevation of right heart pressures   Medications: Infusions:  sodium chloride     ferric gluconate (FERRLECIT) IVPB Stopped (04/25/23 1933)    Scheduled Medications:  aspirin EC  81 mg Oral Daily   atorvastatin  80 mg Oral q morning   carvedilol  12.5 mg Oral BID WC   clonazePAM  0.5 mg Oral TID   clopidogrel  75 mg Oral Daily   darbepoetin (ARANESP) injection - NON-DIALYSIS  60 mcg Subcutaneous Q Fri-1800   gabapentin  300 mg Oral QHS   insulin aspart  0-15 Units Subcutaneous TID WC   insulin aspart  14 Units Subcutaneous TID WC   insulin detemir  50 Units Subcutaneous BID   isosorbide mononitrate  60 mg Oral q morning   lamoTRIgine  100 mg Oral BID   multivitamin with minerals  1 tablet Oral Daily   polyethylene glycol  17 g Oral BID   senna-docusate  2 tablet Oral QHS   sertraline  100 mg Oral Daily    sodium chloride flush  3 mL Intravenous Q12H    have reviewed scheduled and prn medications.  Physical Exam: General: obese, no distress but says she feels terrible Heart: RRR Lungs: mostly clear Abdomen: obese, soft non tender Extremities:no obvious peripheral edema     04/26/2023,8:32 AM  LOS: 9 days

## 2023-04-26 NOTE — Plan of Care (Signed)
Patient progressing with hospital goals.  Continues to endorse intermittent, chronic pain in lower back.  Episodes of nausea at times that are relieved by medications; tolerates diet.  Ambulated in room infrequently today.

## 2023-04-26 NOTE — Progress Notes (Signed)
Physical Therapy Treatment Patient Details Name: [name] MRN: [id number] DOB: 05/25/1961 Today's Date: 04/26/2023   History of Present Illness 62 y.o. female with medical history significant of CAD s/p PCI, hypertension, hyperlipidemia, T2DM, DVT on Eliquis who presents to the emergency department with complaints of chest pain.  Patient complained of left-sided chest pain which occurred yesterday while sitting in a car chest pain was described as sharp and nonreproducible and was rated as 8/10 on pain scale with radiation to the back, this was associated with dizziness and nausea without vomiting.  She states that she called her PCP who advised her to go to the ED for further evaluation and management.  Patient denies shortness of breath, diaphoresis, fever.    PT Comments  Pt agreeable to mobility.  Still experiencing some chest pain and some SOB, likely due to fluid in/around the pleural spaces.  Emphasis on transitions, transfer safety and progression of gait in the halls with CG to light minimal assist due to mild instability.    If plan is discharge home, recommend the following: A little help with walking and/or transfers;Help with stairs or ramp for entrance;Assistance with cooking/housework   Can travel by private vehicle        Equipment Recommendations       Recommendations for Other Services       Precautions / Restrictions Precautions Precautions: Fall Restrictions Weight Bearing Restrictions: No     Mobility  Bed Mobility Overal bed mobility: Modified Independent                  Transfers Overall transfer level: Modified independent Equipment used: None               General transfer comment: no assist    Ambulation/Gait Ambulation/Gait assistance: Contact guard assist Gait Distance (Feet): 140 Feet Assistive device: IV Pole Gait Pattern/deviations: Step-through pattern, Decreased step length - right, Decreased step length - left,  Decreased stride length, Wide base of support Gait velocity: decreased Gait velocity interpretation: <1.8 ft/sec, indicate of risk for recurrent falls   General Gait Details: slow, wide BOS using the IV pole.  Mildly "gimpy" due to L side weakness.  2 standing rests, but Sats in the mid 90's and HR in the 80's during activity.   Stairs             Wheelchair Mobility     Tilt Bed    Modified Rankin (Stroke Patients Only)       Balance     Sitting balance-Leahy Scale: Good       Standing balance-Leahy Scale: Fair                              Cognition Arousal: Alert Behavior During Therapy: WFL for tasks assessed/performed Overall Cognitive Status: Within Functional Limits for tasks assessed                                          Exercises      General Comments General comments (skin integrity, edema, etc.): vss      Pertinent Vitals/Pain Pain Assessment Pain Assessment: Faces Faces Pain Scale: Hurts little more Pain Location: chest and bilateral lower legs Pain Descriptors / Indicators: Aching Pain Intervention(s): Monitored during session    Home Living  Prior Function            PT Goals (current goals can now be found in the care plan section) Acute Rehab PT Goals PT Goal Formulation: With patient Time For Goal Achievement: 05/05/23 Potential to Achieve Goals: Good Progress towards PT goals: Progressing toward goals    Frequency    Min 1X/week      PT Plan      Co-evaluation              AM-PAC PT "6 Clicks" Mobility   Outcome Measure  Help needed turning from your back to your side while in a flat bed without using bedrails?: None Help needed moving from lying on your back to sitting on the side of a flat bed without using bedrails?: None Help needed moving to and from a bed to a chair (including a wheelchair)?: A Little Help needed standing up from a  chair using your arms (e.g., wheelchair or bedside chair)?: A Little Help needed to walk in hospital room?: A Little Help needed climbing 3-5 steps with a railing? : A Little 6 Click Score: 20    End of Session   Activity Tolerance: Patient limited by fatigue Patient left: in chair;with call bell/phone within reach Nurse Communication: Mobility status PT Visit Diagnosis: Unsteadiness on feet (R26.81);History of falling (Z91.81);Other abnormalities of gait and mobility (R26.89)     Time: 4401-0272 PT Time Calculation (min) (ACUTE ONLY): 15 min  Charges:    $Gait Training: 8-22 mins PT General Charges $$ ACUTE PT VISIT: 1 Visit                     04/26/2023  Jacinto Halim., PT Acute Rehabilitation Services [phone]  (office)   Eliseo Gum  04/26/2023, 12:12 PM

## 2023-04-26 NOTE — Plan of Care (Signed)

## 2023-04-26 NOTE — Progress Notes (Signed)
PROGRESS NOTE  Ludora Saab JXB:[id number] DOB: 1961-03-26 DOA: 04/15/2023 PCP: Patient, No Pcp Per  Brief History:  62 y.o. female with medical history significant of CAD s/p PCI, hypertension, hyperlipidemia, T2DM, DVT on Eliquis who presents to the emergency department with complaints of chest pain.  Patient complained of left-sided chest pain which occurred yesterday while sitting in a car chest pain was described as sharp and nonreproducible and was rated as 8/10 on pain scale with radiation to the back, this was associated with dizziness and nausea without vomiting.  She states that she called her PCP who advised her to go to the ED for further evaluation and management.  Patient denies shortness of breath, diaphoresis, fever.  The patient's troponins have been unremarkable.  Echocardiogram on 04/22/2023 showed EF 60 to 65%, trivial MR, normal RV EF.  However, patient continued to have intermittent chest pain throughout her hospitalization.  Her hospitalization was prolonged due to development of acute on chronic renal failure with serum creatinine up to 2.56.  Nephrology was consulted.  The patient was started on IV heparin for her persistent chest pain.  She received IV heparin for about a week and it was ultimately discontinued on 04/24/2023.  After discussion with nephrology, it was felt that the patient likely may have new renal baseline.  After discussion with the patient of the risk, benefits, and alternatives, all parties involved agreed to pursue left heart catheterization.   04/25/2023: Patient underwent cardiac catheterization today.  Cardiology input is appreciated.  Postcatheterization, patient reports seeing spots, with associated left-sided eye pain.  On further questioning, patient reports prior episodes of similar symptoms.  CT stroke protocol has been activated.  Patient is currently on aspirin and Plavix.  Neurology input is appreciated.  04/26/2023: Patient seen.  Patient  underwent right heart catheterization yesterday.  Pulmonary hypertension and volume overload noted.  Patient has been on IV diuresis.  Volume status looks better today.  Creatinine of 2.62 noted today.  Nephrology input is highly appreciated.  Patient also reports dyspnea on minimal exertion.    Assessment/Plan: Chest pain with high risk for cardiac etiology/unstable angina - appreciate cardiology team consultation - continue supportive measures, IV heparin infusion and high dose statin  - working to improve creatinine in case cath is approved - cardio team planning to premedicate for cath given history of contrast allergy -has some atypical features--requiring fentanyl IV pushes for pain -Patient has received 8 days of intravenous heparin.  This was discontinued on 04/24/2023. -04/22/2023 echo EF 60 to 65%, normal RV EF, trivial TR. -After discussion with nephrology and the patient, it was decided to go forward with left heart catheterization. 04/25/2023: Patient underwent cardiac catheterization today.  Awaiting full report. 04/26/2023: Patient continues to report chest pain.  Only right heart catheterization was done yesterday.  Cardiology is following patient.  Cardiology input is appreciated.   AKI on CKD stage 3b  -Baseline creatinine 1.2-1.4 -Serum creatinine peaking 2.53 - continue IV fluids per renal - recheck renal panel - renal US done 8/3 with normal findings reported  - urinalysis reassuring - appreciate nephrology consultation to assist with getting medically optimized for cath - CT stone study--negative hydronephrosis, bibasilar GGO--infx vs edema; trace bilateral pleural effusion, moderate stool burden -8/8--discussed with Dr. Nicolette Bang may have new renal baseline  -Patient agrees to move forward with left heart catheterization with the understanding of worsening renal function. 04/25/2023: Serum creatinine is 1.21 today.  Continue to monitor renal function closely. 04/26/2023:  Nephrology input is appreciated.  Rising serum creatinine is noted.  Continue to monitor closely.  Avoid nephrotoxins.  Keep MAP greater than 65 mmHg.  Optimize volume.  Avoid hemodynamic instability.  pulmonary infiltrates -viral respiratory panel--negative -check COVID negative -check PCT < 0.10   Anemia in CKD  - Hg down to 7.7 with hydration - with her CAD, cardiology team want her Hg>8 - transfused 1 unit PRBC on 8/5 - Hg up to 9 after transfusion - follow   - hemoccult stools     RLQ abdominal pain - intermittent, lipase test came back normal - pt reports appendectomy and cholecystectomy - CT abd/pelvis without contrast ordered (due to contrast allergy and upcoming cath) - 8/2 CT AP with findings of hepatomegaly and steatosis of liver, no acute findings to explain abd pain - 8/6 CT renal protocol--negative hydronephrosis, bibasilar GGO--infx vs edema; trace bilateral pleural effusion, moderate stool burden  CAD -continue ASA/plavix, statin -s/p RCA PCI in [place] 2022 04/26/2023: Cardiology team is directing care.   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 - increased prandial novolog to 14 units TID with meals - continue home detemir 50 units BID  - 04/15/23 A1C--9.1    Chronic atrial fibrillation - carvedilol for heart rate control - holding apixaban in anticipation for possible cath later this week  - IV heparin for anticoagulation>>stopped 8/8   Hyperlipidemia - atorvastatin 80 mg daily    Essential Hypertension  - resumed home carvedilol with good BP control    Morbid Obesity -BMI 45.26 -lifestyle modification       Family Communication:   no Family at bedside   Consultants:  renal, cards   Code Status:  FULL   DVT Prophylaxis:  IV Heparin     Procedures: As Listed in  Progress Note Above   Antibiotics: None   Subjective: Patient continues to report chest pain..  Objective: Vitals:   04/25/23 1829 04/25/23 1945 04/26/23 0351 04/26/23 1143  BP: (!) 124/53 (!) 129/47 (!) 139/57 (!) 153/67  Pulse:  63 (!) 59 69  Resp:  18 18 15   Temp:  98.2 F (36.8 C) 98 F (36.7 C) 98.7 F (37.1 C)  TempSrc:  Oral Oral Oral  SpO2:  95% 93% 95%  Weight:      Height:        Intake/Output Summary (Last 24 hours) at 04/26/2023 1745 Last data filed at 04/26/2023 0402 Gross per 24 hour  Intake 510.05 ml  Output 3700 ml  Net -3189.95 ml   Weight change:  Exam:  General:  Pt is morbidly obese.  Not in any distress.  Awake and alert.   HEENT: Patient is pale.  No jaundice.   Cardiovascular: S1-S2. Respiratory: Clear to auscultation. Abdomen: Obese, soft and nontender. Neuro: Awake and alert.  Moves all extremities.    Extremities: Edema is improving.  Data Reviewed: I have personally reviewed following labs and imaging studies Basic Metabolic Panel: Recent Labs  Lab 04/21/23 0449 04/22/23 0432 04/23/23 0439 04/24/23 0447 04/25/23 0712 04/25/23 1402 04/25/23 1404 04/26/23 0238  NA 136 134* 136 138 139 142 138 133*  K 4.3 4.5 4.3 4.7 4.2 3.9 4.3 3.6  CL 111 104 103 102 101  --   --  96*  CO2 21* 19* 25 25 25   --   --  26  GLUCOSE 129* 156* 138* 187* 113*  --   --  126*  BUN 18 19 21* 23* 22*  --   --  27*  CREATININE 2.25* 2.33* 2.53* 2.56* 2.52*  --   --  2.62*  CALCIUM 7.7* 8.2* 8.3* 8.5* 8.9  --   --  8.7*  MG 1.8  --   --   --   --   --   --   --   PHOS  --  3.6 4.2 4.6 5.1*  --   --  5.3*   Liver Function Tests: Recent Labs  Lab 04/22/23 0432 04/23/23 0439 04/24/23 0447 04/25/23 0712 04/26/23 0238  ALBUMIN 3.0* 2.8* 2.9* 2.7* 2.8*   No results for input(s): "LIPASE", "AMYLASE" in the last 168 hours.  No results for input(s): "AMMONIA" in the last 168 hours. Coagulation Profile: No results for input(s): "INR", "PROTIME" in the  last 168 hours. CBC: Recent Labs  Lab 04/22/23 0432 04/23/23 0439 04/24/23 0447 04/25/23 0712 04/25/23 1402 04/25/23 1404 04/26/23 0238  WBC 5.6 3.9* 4.3 4.4  --   --  4.6  HGB 9.0* 8.4* 9.0* 9.0* 7.8* 8.8* 8.6*  HCT 28.1* 27.4* 29.4* 28.8* 23.0* 26.0* 27.0*  MCV 85.4 87.8 87.5 86.7  --   --  86.5  PLT 144* 139* 155 151  --   --  161   Cardiac Enzymes: Recent Labs  Lab 04/23/23 0910  CKTOTAL 68   BNP: Invalid input(s): "POCBNP" CBG: Recent Labs  Lab 04/25/23 2052 04/26/23 0354 04/26/23 0733 04/26/23 1147 04/26/23 1559  GLUCAP 175* 129* 108* 211* 166*   HbA1C: No results for input(s): "HGBA1C" in the last 72 hours. Urine analysis:    Component Value Date/Time   COLORURINE YELLOW 04/21/2023 0726   APPEARANCEUR CLOUDY (A) 04/21/2023 0726   LABSPEC 1.006 04/21/2023 0726   PHURINE 5.0 04/21/2023 0726   GLUCOSEU NEGATIVE 04/21/2023 0726   HGBUR NEGATIVE 04/21/2023 0726   BILIRUBINUR NEGATIVE 04/21/2023 0726   KETONESUR NEGATIVE 04/21/2023 0726   PROTEINUR NEGATIVE 04/21/2023 0726   UROBILINOGEN 0.2 12/17/2011 2258   NITRITE NEGATIVE 04/21/2023 0726   LEUKOCYTESUR NEGATIVE 04/21/2023 0726   Sepsis Labs: @LABRCNTIP (procalcitonin:4,lacticidven:4) ) Recent Results (from the past 240 hour(s))  Respiratory (~20 pathogens) panel by PCR     Status: None   Collection Time: 04/23/23  3:13 AM   Specimen: Nasopharyngeal Swab; Respiratory  Result Value Ref Range Status   Adenovirus NOT DETECTED NOT DETECTED Final   Coronavirus 229E NOT DETECTED NOT DETECTED Final    Comment: (NOTE) The Coronavirus on the Respiratory Panel, DOES NOT test for the novel  Coronavirus (2019 nCoV)    Coronavirus HKU1 NOT DETECTED NOT DETECTED Final   Coronavirus NL63 NOT DETECTED NOT DETECTED Final   Coronavirus OC43 NOT DETECTED NOT DETECTED Final   Metapneumovirus NOT DETECTED NOT DETECTED Final   Rhinovirus / Enterovirus NOT DETECTED NOT DETECTED Final   Influenza A NOT DETECTED NOT  DETECTED Final   Influenza B NOT DETECTED NOT DETECTED Final   Parainfluenza Virus 1 NOT DETECTED NOT DETECTED Final   Parainfluenza Virus 2 NOT DETECTED NOT DETECTED Final   Parainfluenza Virus 3 NOT DETECTED NOT DETECTED Final   Parainfluenza Virus 4 NOT DETECTED NOT DETECTED Final   Respiratory Syncytial Virus NOT DETECTED NOT DETECTED Final   Bordetella pertussis NOT DETECTED NOT DETECTED Final   Bordetella Parapertussis NOT DETECTED NOT DETECTED  Final   Chlamydophila pneumoniae NOT DETECTED NOT DETECTED Final   Mycoplasma pneumoniae NOT DETECTED NOT DETECTED Final    Comment: Performed at [facility], [address], [place], [place] [address]  SARS Coronavirus 2 by RT PCR (hospital order, performed in [facility] hospital lab) *cepheid single result test* Anterior Nasal Swab     Status: None   Collection Time: 04/23/23  5:31 PM   Specimen: Anterior Nasal Swab  Result Value Ref Range Status   SARS Coronavirus 2 by RT PCR NEGATIVE NEGATIVE Final    Comment: (NOTE) SARS-CoV-2 target nucleic acids are NOT DETECTED.  The SARS-CoV-2 RNA is generally detectable in upper and lower respiratory specimens during the acute phase of infection. The lowest concentration of SARS-CoV-2 viral copies this assay can detect is 250 copies / mL. A negative result does not preclude SARS-CoV-2 infection and should not be used as the sole basis for treatment or other patient management decisions.  A negative result may occur with improper specimen collection / handling, submission of specimen other than nasopharyngeal swab, presence of viral mutation(s) within the areas targeted by this assay, and inadequate number of viral copies (<250 copies / mL). A negative result must be combined with clinical observations, patient history, and epidemiological information.  Fact Sheet for Patients:   RoadLapTop.co.za  Fact Sheet for Healthcare  Providers: http://kim-miller.com/  This test is not yet approved or  cleared by the [place] FDA and has been authorized for detection and/or diagnosis of SARS-CoV-2 by FDA under an Emergency Use Authorization (EUA).  This EUA will remain in effect (meaning this test can be used) for the duration of the COVID-19 declaration under Section 564(b)(1) of the Act, 21 U.S.C. section 360bbb-3(b)(1), unless the authorization is terminated or revoked sooner.  Performed at [facility], [address] [place], [place] [address]      Scheduled Meds:  aspirin EC  81 mg Oral Daily   atorvastatin  80 mg Oral q morning   carvedilol  12.5 mg Oral BID WC   clonazePAM  0.5 mg Oral TID   clopidogrel  75 mg Oral Daily   darbepoetin (ARANESP) injection - NON-DIALYSIS  60 mcg Subcutaneous Q Fri-1800   enoxaparin (LOVENOX) injection  120 mg Subcutaneous Q24H   furosemide  40 mg Oral BID   gabapentin  300 mg Oral QHS   insulin aspart  0-15 Units Subcutaneous TID WC   insulin aspart  14 Units Subcutaneous TID WC   insulin detemir  50 Units Subcutaneous BID   isosorbide mononitrate  60 mg Oral q morning   lamoTRIgine  100 mg Oral BID   multivitamin with minerals  1 tablet Oral Daily   polyethylene glycol  17 g Oral BID   senna-docusate  2 tablet Oral QHS   sertraline  100 mg Oral Daily   sodium chloride flush  3 mL Intravenous Q12H   Continuous Infusions:  sodium chloride     ferric gluconate (FERRLECIT) IVPB 250 mg (04/26/23 1104)    Procedures/Studies: CT HEAD CODE STROKE WO CONTRAST  Result Date: 04/25/2023 CLINICAL DATA:  Code stroke.  TIA.  Acute visual changes. EXAM: CT HEAD WITHOUT CONTRAST TECHNIQUE: Contiguous axial images were obtained from the base of the skull through the vertex without intravenous contrast. RADIATION DOSE REDUCTION: This exam was performed according to the departmental dose-optimization program which includes automated exposure control,  adjustment of the mA and/or kV according to patient size and/or use of iterative reconstruction  technique. COMPARISON:  CT head without contrast 02/08/2023 FINDINGS: Brain: No acute infarct, hemorrhage, or mass lesion is present. The ventricles are of normal size. No significant extraaxial fluid collection is present. The brainstem and cerebellum are within normal limits. Midline structures are within normal limits. Vascular: Atherosclerotic calcifications are present within the cavernous internal carotid arteries. No hyperdense vessel is present. Skull: Calvarium is intact. No focal lytic or blastic lesions are present. No significant extracranial soft tissue lesion is present. Sinuses/Orbits: Polyps are again noted in the left maxillary sinus. Increased mucosal thickening is present. The globes and orbits are within normal limits. IMPRESSION: 1. Normal CT appearance of the brain. 2. Increased mucosal thickening in the left maxillary sinus. The above was relayed via text pager to Dr. Viviann Spare on 04/25/2023 at 18:56 . Electronically Signed   By: Marin Roberts M.D.   On: 04/25/2023 18:56   CARDIAC CATHETERIZATION  Result Date: 04/25/2023   Hemodynamic findings consistent with mild pulmonary hypertension. Mild elevation of right heart pressures   CT RENAL STONE STUDY  Result Date: 04/22/2023 CLINICAL DATA:  Left flank pain, nausea EXAM: CT ABDOMEN AND PELVIS WITHOUT CONTRAST TECHNIQUE: Multidetector CT imaging of the abdomen and pelvis was performed following the standard protocol without IV contrast. RADIATION DOSE REDUCTION: This exam was performed according to the departmental dose-optimization program which includes automated exposure control, adjustment of the mA and/or kV according to patient size and/or use of iterative reconstruction technique. COMPARISON:  04/18/2023 FINDINGS: Lower chest: Interval development of bibasilar peribronchovascular ground-glass pulmonary infiltrates likely related to  multifocal infection or noncardiogenic pulmonary edema. Trace bilateral pleural effusions. Extensive multi-vessel coronary artery calcification. Global cardiac size within normal limits. Hepatobiliary: No focal liver abnormality is seen. Status post cholecystectomy. No biliary dilatation. Pancreas: Unremarkable. No pancreatic ductal dilatation or surrounding inflammatory changes. Spleen: Mild splenomegaly is stable with the spleen measuring 15.6 cm in greatest dimension. This appears slightly progressive since remote prior examination 10/07/2022. No intrasplenic lesions identified on this noncontrast examination. Adrenals/Urinary Tract: The adrenal glands are unremarkable. The kidneys are normal in size and position. 1-2 mm punctate nonobstructing calculi are noted within the interpolar and lower polar regions of the kidneys bilaterally. No hydronephrosis. Minimal nonspecific bilateral perinephric stranding. No perinephric fluid collections. No hydronephrosis. No ureteral calculi. The bladder is unremarkable. Stomach/Bowel: Moderate colonic stool burden without evidence of obstruction. Status post subtotal appendectomy. The stomach, small bowel, and large bowel are otherwise unremarkable. No evidence of obstruction or focal inflammation. No free intraperitoneal gas or fluid. Vascular/Lymphatic: Aortic atherosclerosis. No enlarged abdominal or pelvic lymph nodes. Reproductive: Status post hysterectomy. No adnexal masses. Other: No abdominal wall hernia. Musculoskeletal: Osseous structures are age-appropriate. No acute bone abnormality. No lytic or blastic bone lesion. IMPRESSION: 1. Interval development of bibasilar peribronchovascular ground-glass pulmonary infiltrates likely related to multifocal infection or noncardiogenic pulmonary edema. Trace bilateral pleural effusions. 2. Extensive multi-vessel coronary artery calcification. 3. Mild splenomegaly, progressive since remote prior examination of 10/07/2022. 4.  Minimal bilateral nonobstructing nephrolithiasis. No urolithiasis. No hydronephrosis. 5. Moderate colonic stool burden without evidence of obstruction. 6. Aortic atherosclerosis. Aortic Atherosclerosis (ICD10-I70.0). Electronically Signed   By: Helyn Numbers M.D.   On: 04/22/2023 19:45   ECHOCARDIOGRAM LIMITED  Result Date: 04/22/2023    ECHOCARDIOGRAM LIMITED REPORT   Patient Name:   GAYNELL STROW Koelling Date of Exam: 04/22/2023 Medical Rec #:  [id number]      Height:       63.0 in Accession #:    [id number]  Weight:       245.0 lb Date of Birth:  July 20, 1961      BSA:          2.108 m Patient Age:    61 years       BP:           117/62 mmHg Patient Gender: F              HR:           67 bpm. [facility] Procedure: Limited Echo, Cardiac Doppler and Color Doppler Indications:    CHF                 IVC check  History:        Patient has prior history of Echocardiogram examinations, most                 recent 04/17/2023. CHF, CAD and Angina, Arrythmias:Atrial                 Fibrillation, Signs/Symptoms:Chest Pain; Risk                 Factors:Hypertension, Diabetes and Dyslipidemia. CKD.  Sonographer:    Mikki Harbor Referring Phys: [id number] VISHNU P MALLIPEDDI  Sonographer Comments: Patient is obese. IMPRESSIONS  1. Limited Echo for IVC check.  2. Left ventricular ejection fraction, by estimation, is 60 to 65%. The left ventricle has normal function. Left ventricular diastolic function could not be evaluated.  3. Right ventricular systolic function is normal. The right ventricular size is normal. There is normal pulmonary artery systolic pressure.  4. The inferior vena cava is normal in size with <50% respiratory variability, suggesting right atrial pressure of 8 mmHg. FINDINGS  Left Ventricle: Left ventricular ejection fraction, by estimation, is 60 to 65%. The left ventricle has normal function. The left ventricular internal cavity size was normal in size. There is no left ventricular hypertrophy.  Left ventricular diastolic function could not be evaluated. Right Ventricle: The right ventricular size is normal. Right ventricular systolic function is normal. There is normal pulmonary artery systolic pressure. The tricuspid regurgitant velocity is 2.52 m/s, and with an assumed right atrial pressure of 8 mmHg,  the estimated right ventricular systolic pressure is 33.4 mmHg. Left Atrium: Left atrial size was not assessed. Right Atrium: Right atrial size was not assessed. Pericardium: There is no evidence of pericardial effusion. Mitral Valve: The mitral valve is normal in structure. Tricuspid Valve: The tricuspid valve is normal in structure. Tricuspid valve regurgitation is trivial. Aortic Valve: The aortic valve is tricuspid. There is mild calcification of the aortic valve. There is mild thickening of the aortic valve. There is mild aortic valve annular calcification. Pulmonic Valve: The pulmonic valve was normal in structure. Aorta: The aortic root is normal in size and structure. Venous: The inferior vena cava is normal in size with less than 50% respiratory variability, suggesting right atrial pressure of 8 mmHg. IAS/Shunts: The interatrial septum was not assessed. LEFT VENTRICLE PLAX 2D LVIDd:         4.70 cm LVIDs:         3.00 cm LV PW:         1.20 cm LV IVS:        1.10 cm LVOT diam:     2.10 cm LVOT Area:     3.46 cm  LEFT ATRIUM         Index LA diam:    4.40 cm  2.09 cm/m   AORTA Ao Root diam: 3.10 cm TRICUSPID VALVE TR Peak grad:   25.4 mmHg TR Vmax:        252.00 cm/s  SHUNTS Systemic Diam: 2.10 cm Vishnu Priya Mallipeddi Electronically signed by Winfield Rast Mallipeddi Signature Date/Time: 04/22/2023/5:07:38 PM    Final    DG CHEST PORT 1 VIEW  Result Date: 04/21/2023 CLINICAL DATA:  Chest pain EXAM: PORTABLE CHEST 1 VIEW COMPARISON:  04/20/2023 FINDINGS: The heart size and mediastinal contours are within normal limits. Both lungs are clear. The visualized skeletal structures are unremarkable.  IMPRESSION: No active disease. Electronically Signed   By: Charlett Nose M.D.   On: 04/21/2023 16:58   DG CHEST PORT 1 VIEW  Result Date: 04/20/2023 CLINICAL DATA:  [id number] Chest pain [id number] EXAM: PORTABLE CHEST - 1 VIEW COMPARISON:  04/15/2023 FINDINGS: Relatively low lung volumes with some crowding of bronchovascular structures centrally. No new infiltrate. No pneumothorax. Heart size and mediastinal contours are within normal limits. No effusion. Visualized bones unremarkable. IMPRESSION: Low lung volumes. No acute findings. Electronically Signed   By: Corlis Leak M.D.   On: 04/20/2023 10:03   US RENAL  Result Date: 04/19/2023 CLINICAL DATA:  Acute on chronic kidney injury EXAM: RENAL / URINARY TRACT ULTRASOUND COMPLETE COMPARISON:  CT abdomen pelvis 04/18/2023 FINDINGS: Right Kidney: Renal measurements: 9.4 x 4.9 x 4.4 cm = volume: 105 mL. Echogenicity within normal limits. No mass or hydronephrosis visualized. Left Kidney: Renal measurements: 11.6 x 5.6 x 5.0 cm = volume: 169 mL. Echogenicity within normal limits. No mass or hydronephrosis visualized. Bladder: Limited evaluation due to to collapsed configuration. Other: Diffuse increased echogenicity of the visualized portions of the hepatic parenchyma are a nonspecific indicator of hepatocellular dysfunction, most commonly steatosis. IMPRESSION: No significant sonographic abnormality of the kidneys. Electronically Signed   By: Acquanetta Belling M.D.   On: 04/19/2023 10:55   CT ABDOMEN PELVIS WO CONTRAST  Result Date: 04/18/2023 CLINICAL DATA:  Right lower quadrant abdominal pain EXAM: CT ABDOMEN AND PELVIS WITHOUT CONTRAST TECHNIQUE: Multidetector CT imaging of the abdomen and pelvis was performed following the standard protocol without IV contrast. RADIATION DOSE REDUCTION: This exam was performed according to the departmental dose-optimization program which includes automated exposure control, adjustment of the mA and/or kV according to patient size and/or  use of iterative reconstruction technique. COMPARISON:  None Available. FINDINGS: Lower chest: No acute findings. Coronary artery and aortic calcifications. Hepatobiliary: Prior cholecystectomy. Diffuse fatty infiltration of the liver. No focal hepatic abnormality. Mild hepatomegaly with craniocaudal length 23 cm. Pancreas: No focal abnormality or ductal dilatation. Spleen: Splenomegaly with a craniocaudal length of 14.5 cm. This is stable since prior study. No focal abnormality. Adrenals/Urinary Tract: Punctate bilateral nonobstructing renal calculi. No ureteral stones or hydronephrosis. Adrenal glands and urinary bladder unremarkable. Stomach/Bowel: Stomach, large and small bowel grossly unremarkable. Vascular/Lymphatic: Aortic atherosclerosis. No evidence of aneurysm or adenopathy. Reproductive: Prior hysterectomy.  No adnexal masses. Other: No free fluid or free air. Musculoskeletal: No acute bony abnormality. IMPRESSION: Hepatic steatosis.  Hepatosplenomegaly. No acute findings in the abdomen or pelvis. Aortic atherosclerosis, coronary artery disease. Punctate bilateral nephrolithiasis.  No hydronephrosis. Electronically Signed   By: Charlett Nose M.D.   On: 04/18/2023 19:22   ECHOCARDIOGRAM COMPLETE  Result Date: 04/17/2023    ECHOCARDIOGRAM REPORT   Patient Name:   MURSAL SELL Luevanos Date of Exam: 04/17/2023 Medical Rec #:  [id number]      Height:       63.0  in Accession #:    [id number]     Weight:       245.0 lb Date of Birth:  Feb 03, 1961      BSA:          2.108 m Patient Age:    61 years       BP:           136/73 mmHg Patient Gender: F              HR:           71 bpm. [facility] Procedure: 2D Echo, Cardiac Doppler and Color Doppler Indications:    Chest Pain R07.9  History:        Patient has no prior history of Echocardiogram examinations.                 CAD, Arrythmias:Atrial Fibrillation, Signs/Symptoms:Chest Pain;                 Risk Factors:Dyslipidemia, Hypertension and Diabetes.   Sonographer:    Aron Baba Referring Phys: [id number] Ellsworth Lennox  Sonographer Comments: Patient is obese. IMPRESSIONS  1. Left ventricular ejection fraction, by estimation, is 55 to 60%. The left ventricle has normal function. The left ventricle has no regional wall motion abnormalities. Left ventricular diastolic parameters are consistent with Grade I diastolic dysfunction (impaired relaxation).  2. Right ventricular systolic function is normal. The right ventricular size is normal. Tricuspid regurgitation signal is inadequate for assessing PA pressure.  3. The mitral valve is abnormal. Mild mitral valve regurgitation. No evidence of mitral stenosis.  4. The aortic valve is tricuspid. Aortic valve regurgitation is not visualized. No aortic stenosis is present.  5. Aortic dilatation noted. 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  6. The inferior vena cava is normal in size with greater than 50% respiratory variability, suggesting right atrial pressure of 3 mmHg. FINDINGS  Left Ventricle: Left ventricular ejection fraction, by estimation, is 55 to 60%. The left ventricle has normal function. The left ventricle has no regional wall motion abnormalities. The left ventricular internal cavity size was normal in size. There is  no left ventricular hypertrophy. Left ventricular diastolic parameters are consistent with Grade I diastolic dysfunction (impaired relaxation). Normal left ventricular filling pressure. Right Ventricle: The right ventricular size is normal. Right vetricular wall thickness was not well visualized. Right ventricular systolic function is normal. Tricuspid regurgitation signal is inadequate for assessing PA pressure. Left Atrium: Left atrial size was normal in size. Right Atrium: Right atrial size was normal in size. Pericardium: There is no evidence of pericardial effusion. Mitral Valve: The mitral valve is abnormal. Mild mitral valve regurgitation. No evidence of  mitral valve stenosis. Tricuspid Valve: The tricuspid valve is normal in structure. Tricuspid valve regurgitation is trivial. No evidence of tricuspid stenosis. Aortic Valve: The aortic valve is tricuspid. Aortic valve regurgitation is not visualized. No aortic stenosis is present. Aortic valve mean gradient measures 5.4 mmHg. Aortic valve peak gradient measures 8.7 mmHg. Aortic valve area, by VTI measures 1.85 cm. Pulmonic Valve: The pulmonic valve was not well visualized. Pulmonic valve regurgitation is not visualized. No evidence of pulmonic stenosis. Aorta: The aortic root is normal in size and structure and aortic dilatation noted. 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 Venous: The inferior vena cava is normal in size with greater than 50% respiratory variability, suggesting right atrial pressure of 3 mmHg. IAS/Shunts: No  atrial level shunt detected by color flow Doppler.  LEFT VENTRICLE PLAX 2D LVIDd:         5.00 cm   Diastology LVIDs:         3.60 cm   LV e' medial:    13.20 cm/s LV PW:         0.90 cm   LV E/e' medial:  5.8 LV IVS:        0.80 cm   LV e' lateral:   6.22 cm/s LVOT diam:     2.00 cm   LV E/e' lateral: 12.3 LV SV:         67 LV SV Index:   32 LVOT Area:     3.14 cm  RIGHT VENTRICLE RV S prime:     12.60 cm/s TAPSE (M-mode): 2.2 cm LEFT ATRIUM             Index        RIGHT ATRIUM           Index LA diam:        4.00 cm 1.90 cm/m   RA Area:     10.10 cm LA Vol (A2C):   69.3 ml 32.88 ml/m  RA Volume:   20.40 ml  9.68 ml/m LA Vol (A4C):   65.9 ml 31.26 ml/m LA Biplane Vol: 70.8 ml 33.59 ml/m  AORTIC VALVE AV Area (Vmax):    1.94 cm AV Area (Vmean):   1.69 cm AV Area (VTI):     1.85 cm AV Vmax:           147.81 cm/s AV Vmean:          109.081 cm/s AV VTI:            0.360 m AV Peak Grad:      8.7 mmHg AV Mean Grad:      5.4 mmHg LVOT Vmax:         91.34 cm/s LVOT Vmean:        58.622 cm/s LVOT VTI:          0.212 m LVOT/AV VTI ratio: 0.59  AORTA Ao Root diam:  3.40 cm Ao Asc diam:  3.60 cm MITRAL VALVE MV Area (PHT): 4.71 cm     SHUNTS MV Decel Time: 161 msec     Systemic VTI:  0.21 m MR Peak grad: 82.2 mmHg     Systemic Diam: 2.00 cm MR Vmax:      453.33 cm/s MV E velocity: 76.60 cm/s MV A velocity: 108.00 cm/s MV E/A ratio:  0.71 Dina Rich MD Electronically signed by Dina Rich MD Signature Date/Time: 04/17/2023/1:33:26 PM    Final    CT CHEST ABDOMEN PELVIS WO CONTRAST  Result Date: 04/16/2023 CLINICAL DATA:  Sharp chest pain radiating to back. Left-sided rib pain and right-sided abdominal pain. EXAM: CT CHEST, ABDOMEN AND PELVIS WITHOUT CONTRAST TECHNIQUE: Multidetector CT imaging of the chest, abdomen and pelvis was performed following the standard protocol without IV contrast. RADIATION DOSE REDUCTION: This exam was performed according to the departmental dose-optimization program which includes automated exposure control, adjustment of the mA and/or kV according to patient size and/or use of iterative reconstruction technique. COMPARISON:  10/07/2022. FINDINGS: CT CHEST FINDINGS Cardiovascular: The heart is normal in size and there is no pericardial effusion. Three-vessel coronary artery calcifications are noted. There is atherosclerotic calcification of the aorta without evidence of aneurysm. The pulmonary trunk is normal in caliber. Mediastinum/Nodes: No mediastinal or hilar lymphadenopathy. Evaluation of  the hila is limited due to lack of IV contrast. Coarse calcification is noted in the left lobe of the thyroid gland. The trachea and esophagus are within normal limits. Lungs/Pleura: Mild atelectasis is present bilaterally. No effusion or pneumothorax. Small calcified granuloma are present bilaterally. Musculoskeletal: Degenerative changes are present in the thoracic spine. No acute fracture is seen. CT ABDOMEN PELVIS FINDINGS Hepatobiliary: No focal liver abnormality is seen. The liver is mildly enlarged. No gallstones, gallbladder wall  thickening, or biliary dilatation. Pancreas: Unremarkable. No pancreatic ductal dilatation or surrounding inflammatory changes. Spleen: The spleen is enlarged at 14.1 cm in length. No focal abnormality. Adrenals/Urinary Tract: The adrenal glands are within normal limits. A nonobstructive renal calculus is noted on the right. No hydroureteronephrosis bilaterally. The bladder is unremarkable. Stomach/Bowel: Stomach is within normal limits. Appendix is surgically absent. No evidence of bowel wall thickening, distention, or inflammatory changes. No free air or pneumatosis. Vascular/Lymphatic: Aortic atherosclerosis. No enlarged abdominal or pelvic lymph nodes. Reproductive: Status post hysterectomy. No adnexal masses. Other: No abdominopelvic ascites. Subcutaneous fat stranding is noted in the anterior abdominal wall in the left lower quadrant, unchanged from the prior exam. Musculoskeletal: Degenerative changes are present in the lumbar spine. No acute fracture. IMPRESSION: 1. No acute process in the chest, abdomen, or pelvis. 2. Nonobstructive right renal calculus. 3. Mild hepatosplenomegaly. 4. Subcutaneous fat stranding in the anterior abdominal wall in the left lower quadrant, unchanged from the prior exam, possible cellulitis. 5. Coronary artery calcifications. 6. Aortic atherosclerosis. Electronically Signed   By: Thornell Sartorius M.D.   On: 04/16/2023 00:19   DG Chest 2 View  Result Date: 04/15/2023 CLINICAL DATA:  Left-sided chest pain for 2 days EXAM: CHEST - 2 VIEW COMPARISON:  03/30/2023 FINDINGS: Frontal and lateral views of the chest demonstrate an unremarkable cardiac silhouette. No airspace disease, effusion, or pneumothorax. No acute bony abnormality. IMPRESSION: 1. No acute intrathoracic process. Electronically Signed   By: Sharlet Salina M.D.   On: 04/15/2023 22:33   DG Chest 2 View  Result Date: 03/30/2023 CLINICAL DATA:  Chest pain EXAM: CHEST - 2 VIEW COMPARISON:  02/08/2023 FINDINGS: The  heart size and mediastinal contours are within normal limits. Both lungs are clear. The visualized skeletal structures are unremarkable. IMPRESSION: No active cardiopulmonary disease. Electronically Signed   By: Ernie Avena M.D.   On: 03/30/2023 19:01    Barnetta Chapel, MD  [facility]  If 7PM-7AM, please contact night-coverage [email or web address] Password [facility] 04/26/2023, 5:45 PM   LOS: 9 days

## 2023-04-27 DIAGNOSIS — R0789 Other chest pain: Secondary | ICD-10-CM | POA: Diagnosis not present

## 2023-04-27 LAB — RENAL FUNCTION PANEL
Albumin: 2.7 g/dL — ABNORMAL LOW (ref 3.5–5.0)
Anion gap: 13 (ref 5–15)
BUN: 33 mg/dL — ABNORMAL HIGH (ref 6–20)
CO2: 26 mmol/L (ref 22–32)
Calcium: 8.3 mg/dL — ABNORMAL LOW (ref 8.9–10.3)
Chloride: 97 mmol/L — ABNORMAL LOW (ref 98–111)
Creatinine, Ser: 2.75 mg/dL — ABNORMAL HIGH (ref 0.44–1.00)
GFR, Estimated: 19 mL/min — ABNORMAL LOW (ref >60)
Glucose, Bld: 209 mg/dL — ABNORMAL HIGH (ref 70–99)
Phosphorus: 5 mg/dL — ABNORMAL HIGH (ref 2.5–4.6)
Potassium: 3.8 mmol/L (ref 3.5–5.1)
Sodium: 136 mmol/L (ref 135–145)

## 2023-04-27 LAB — GLUCOSE, CAPILLARY
Glucose-Capillary: 184 mg/dL — ABNORMAL HIGH (ref 70–99)
Glucose-Capillary: 167 mg/dL — ABNORMAL HIGH (ref 70–99)
Glucose-Capillary: 171 mg/dL — ABNORMAL HIGH (ref 70–99)
Glucose-Capillary: 128 mg/dL — ABNORMAL HIGH (ref 70–99)
Glucose-Capillary: 188 mg/dL — ABNORMAL HIGH (ref 70–99)

## 2023-04-27 LAB — CBC
HCT: 27.6 % — ABNORMAL LOW (ref 36.0–46.0)
Hemoglobin: 8.5 g/dL — ABNORMAL LOW (ref 12.0–15.0)
MCH: 27 pg (ref 26.0–34.0)
MCHC: 30.8 g/dL (ref 30.0–36.0)
MCV: 87.6 fL (ref 80.0–100.0)
Platelets: 173 10*3/uL (ref 150–400)
RBC: 3.15 MIL/uL — ABNORMAL LOW (ref 3.87–5.11)
RDW: 17.9 % — ABNORMAL HIGH (ref 11.5–15.5)
WBC: 5.5 10*3/uL (ref 4.0–10.5)
nRBC: 0.4 % — ABNORMAL HIGH (ref 0.0–0.2)

## 2023-04-27 LAB — TROPONIN I (HIGH SENSITIVITY)
Troponin I (High Sensitivity): 5 ng/L (ref <18)
Troponin I (High Sensitivity): 5 ng/L (ref <18)

## 2023-04-27 MED ORDER — FUROSEMIDE 10 MG/ML IJ SOLN
80.0000 mg | Freq: Two times a day (BID) | INTRAMUSCULAR | Status: DC
  Administered 2023-04-27: 80 mg via INTRAVENOUS
  Filled 2023-04-27: qty 8

## 2023-04-27 NOTE — Plan of Care (Signed)
  Problem: Coping: Goal: Ability to adjust to condition or change in health will improve Outcome: Progressing   Problem: Fluid Volume: Goal: Ability to maintain a balanced intake and output will improve Outcome: Progressing   Problem: Health Behavior/Discharge Planning: Goal: Ability to identify and utilize available resources and services will improve Outcome: Progressing Goal: Ability to manage health-related needs will improve Outcome: Progressing   Problem: Metabolic: Goal: Ability to maintain appropriate glucose levels will improve Outcome: Progressing   Problem: Nutritional: Goal: Maintenance of adequate nutrition will improve Outcome: Progressing Goal: Progress toward achieving an optimal weight will improve Outcome: Progressing   Problem: Skin Integrity: Goal: Risk for impaired skin integrity will decrease Outcome: Progressing   Problem: Tissue Perfusion: Goal: Adequacy of tissue perfusion will improve Outcome: Progressing   Problem: Education: Goal: Knowledge of General Education information will improve Description: Including pain rating scale, medication(s)/side effects and non-pharmacologic comfort measures Outcome: Progressing   Problem: Health Behavior/Discharge Planning: Goal: Ability to manage health-related needs will improve Outcome: Progressing   Problem: Clinical Measurements: Goal: Ability to maintain clinical measurements within normal limits will improve Outcome: Progressing Goal: Will remain free from infection Outcome: Progressing Goal: Diagnostic test results will improve Outcome: Progressing Goal: Respiratory complications will improve Outcome: Progressing Goal: Cardiovascular complication will be avoided Outcome: Progressing   Problem: Activity: Goal: Risk for activity intolerance will decrease Outcome: Progressing   Problem: Nutrition: Goal: Adequate nutrition will be maintained Outcome: Progressing   Problem: Coping: Goal:  Level of anxiety will decrease Outcome: Progressing   Problem: Elimination: Goal: Will not experience complications related to bowel motility Outcome: Progressing Goal: Will not experience complications related to urinary retention Outcome: Progressing   Problem: Pain Managment: Goal: General experience of comfort will improve Outcome: Progressing   Problem: Safety: Goal: Ability to remain free from injury will improve Outcome: Progressing   Problem: Skin Integrity: Goal: Risk for impaired skin integrity will decrease Outcome: Progressing   Problem: Education: Goal: Understanding of CV disease, CV risk reduction, and recovery process will improve Outcome: Progressing Goal: Individualized Educational Video(s) Outcome: Progressing   Problem: Activity: Goal: Ability to return to baseline activity level will improve Outcome: Progressing   Problem: Cardiovascular: Goal: Ability to achieve and maintain adequate cardiovascular perfusion will improve Outcome: Progressing Goal: Vascular access site(s) Level 0-1 will be maintained Outcome: Progressing   Problem: Health Behavior/Discharge Planning: Goal: Ability to safely manage health-related needs after discharge will improve Outcome: Progressing

## 2023-04-27 NOTE — Progress Notes (Signed)
Progress Note  Patient Name: [name] Date of Encounter: 04/27/2023 Primary Cardiologist: Marjo Bicker, MD   Subjective   Breathing has worsened on PO lasix. She felt the smothering feeling from prior to RHC.  Vital Signs    Vitals:   04/26/23 2001 04/27/23 0013 04/27/23 0305 04/27/23 0638  BP: (!) 121/44 (!) 113/52 (!) 115/57   Pulse: 69 66 61   Resp: 18  18   Temp: 98.6 F (37 C)  98.7 F (37.1 C)   TempSrc: Oral  Oral   SpO2: 95% 98% 97%   Weight:    111.1 kg  Height:        Intake/Output Summary (Last 24 hours) at 04/27/2023 1045 Last data filed at 04/26/2023 2243 Gross per 24 hour  Intake 884.03 ml  Output --  Net 884.03 ml   Filed Weights   04/24/23 0426 04/25/23 0558 04/27/23 8413  Weight: 115.9 kg 114.2 kg 111.1 kg    Physical Exam   GEN: Mild distress, depressed affect   Neck:  JVD+ Cardiac: RRR, no  rubs, or gallops. Systolic murmur Respiratory: Decrease breath sounds in bases GI: Soft, nontender, non-distended  MS: Non pitting  edema  Labs   Telemetry: SR   Chemistry Recent Labs  Lab 04/25/23 0712 04/25/23 1402 04/25/23 1404 04/26/23 0238 04/27/23 0118  NA 139   < > 138 133* 136  K 4.2   < > 4.3 3.6 3.8  CL 101  --   --  96* 97*  CO2 25  --   --  26 26  GLUCOSE 113*  --   --  126* 209*  BUN 22*  --   --  27* 33*  CREATININE 2.52*  --   --  2.62* 2.75*  CALCIUM 8.9  --   --  8.7* 8.3*  ALBUMIN 2.7*  --   --  2.8* 2.7*  GFRNONAA 21*  --   --  20* 19*  ANIONGAP 13  --   --  11 13   < > = values in this interval not displayed.     Hematology Recent Labs  Lab 04/25/23 0712 04/25/23 1402 04/25/23 1404 04/26/23 0238 04/27/23 0118  WBC 4.4  --   --  4.6 5.5  RBC 3.32*  --   --  3.12* 3.15*  HGB 9.0*   < > 8.8* 8.6* 8.5*  HCT 28.8*   < > 26.0* 27.0* 27.6*  MCV 86.7  --   --  86.5 87.6  MCH 27.1  --   --  27.6 27.0  MCHC 31.3  --   --  31.9 30.8  RDW 17.6*  --   --  17.3* 17.9*  PLT 151  --   --  161 173   < > =  values in this interval not displayed.    Cardiac EnzymesNo results for input(s): "TROPONINI" in the last 168 hours. No results for input(s): "TROPIPOC" in the last 168 hours.   BNP Recent Labs  Lab 04/21/23 1256 04/22/23 0432  BNP 183.0* 297.0*     DDimer No results for input(s): "DDIMER" in the last 168 hours.   Cardiac Studies   Cardiac Studies & Procedures   CARDIAC CATHETERIZATION  CARDIAC CATHETERIZATION 04/25/2023  Narrative   Hemodynamic findings consistent with mild pulmonary hypertension.  Mild elevation of right heart pressures     ECHOCARDIOGRAM  ECHOCARDIOGRAM LIMITED 04/22/2023  Narrative ECHOCARDIOGRAM LIMITED REPORT    Patient Name:  Amrie JEAN Dickison Date of Exam: 04/22/2023 Medical Rec #:  [id number]      Height:       63.0 in Accession #:    [id number]     Weight:       245.0 lb Date of Birth:  1961-02-21      BSA:          2.108 m Patient Age:    62 years       BP:           117/62 mmHg Patient Gender: F              HR:           67 bpm. [facility]  Procedure: Limited Echo, Cardiac Doppler and Color Doppler  Indications:    CHF IVC check  History:        Patient has prior history of Echocardiogram examinations, most recent 04/17/2023. CHF, CAD and Angina, Arrythmias:Atrial Fibrillation, Signs/Symptoms:Chest Pain; Risk Factors:Hypertension, Diabetes and Dyslipidemia. CKD.  Sonographer:    Mikki Harbor Referring Phys: [id number] VISHNU P MALLIPEDDI   Sonographer Comments: Patient is obese. IMPRESSIONS   1. Limited Echo for IVC check. 2. Left ventricular ejection fraction, by estimation, is 60 to 65%. The left ventricle has normal function. Left ventricular diastolic function could not be evaluated. 3. Right ventricular systolic function is normal. The right ventricular size is normal. There is normal pulmonary artery systolic pressure. 4. The inferior vena cava is normal in size with <50% respiratory variability, suggesting  right atrial pressure of 8 mmHg.  FINDINGS Left Ventricle: Left ventricular ejection fraction, by estimation, is 60 to 65%. The left ventricle has normal function. The left ventricular internal cavity size was normal in size. There is no left ventricular hypertrophy. Left ventricular diastolic function could not be evaluated.  Right Ventricle: The right ventricular size is normal. Right ventricular systolic function is normal. There is normal pulmonary artery systolic pressure. The tricuspid regurgitant velocity is 2.52 m/s, and with an assumed right atrial pressure of 8 mmHg, the estimated right ventricular systolic pressure is 33.4 mmHg.  Left Atrium: Left atrial size was not assessed.  Right Atrium: Right atrial size was not assessed.  Pericardium: There is no evidence of pericardial effusion.  Mitral Valve: The mitral valve is normal in structure.  Tricuspid Valve: The tricuspid valve is normal in structure. Tricuspid valve regurgitation is trivial.  Aortic Valve: The aortic valve is tricuspid. There is mild calcification of the aortic valve. There is mild thickening of the aortic valve. There is mild aortic valve annular calcification.  Pulmonic Valve: The pulmonic valve was normal in structure.  Aorta: The aortic root is normal in size and structure.  Venous: The inferior vena cava is normal in size with less than 50% respiratory variability, suggesting right atrial pressure of 8 mmHg.  IAS/Shunts: The interatrial septum was not assessed.  LEFT VENTRICLE PLAX 2D LVIDd:         4.70 cm LVIDs:         3.00 cm LV PW:         1.20 cm LV IVS:        1.10 cm LVOT diam:     2.10 cm LVOT Area:     3.46 cm   LEFT ATRIUM         Index LA diam:    4.40 cm 2.09 cm/m  AORTA Ao Root diam: 3.10 cm  TRICUSPID VALVE TR Peak grad:  25.4 mmHg TR Vmax:        252.00 cm/s  SHUNTS Systemic Diam: 2.10 cm  Vishnu Priya Mallipeddi Electronically signed by Winfield Rast  Mallipeddi Signature Date/Time: 04/22/2023/5:07:38 PM    Final               Assessment & Plan   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 Acute on chronic HFpEF Morbid Obesity - worsening volume status and symptoms; increase to 80 iv LASIX PO BID - no evidence of decreased cardiac output, no plan for inotropes   CAD  Hx of NSTEMI with DES-RCA 2022 ([place]) With chest pain worse with hypervolemia  - received 2.75 x 18 mm DES, remaining nonobstructive disease treated medically - has contrast allergy, has not had issues with nitrates - she is willing to proceed with heart cath even if she needs HD after - continue DAPT for now, do not plan for triple therapy unless intervention is performed - continue Coreg, imdur, and statin  - presently not euvolemic for cath.  Paroxymal atrial fibrillation Chronic anticoagulation - maintaining sinus rhythm on coreg - lovenox vs heparin (based on kidney function)  Depression - patient feels lack of support; she would love some support; she asked me to call her daugther (I added a mobile number for her new phone); No answer X2 - consult to spiritual care ordered     For questions or updates, please contact [facility] Please consult [email or web address] for contact info under Cardiology/STEMI.      Riley Lam, MD FASE [facility] Cardiologist [facility]  [address] [place], [address] [place], [place] [address] [phone]  10:45 AM

## 2023-04-27 NOTE — Progress Notes (Signed)
Called to patient room.  Patient c/o 8/10 central chest pain, non-radiating, characterized as aching and constant.  Prior assessment patient had been expressing back pain.  Performed 12-lead EKG and reviewed with Dr. Dartha Lodge; no STEMI but would trend high-sensitivity troponin I q2hrs x 2 and make cardiology aware of patient event and status.  Plan was for left-heart catheterization pending reduction in BUN/creatinine levels.  Per Dr. Dartha Lodge, nephrology has seen patient recently and would clear for LHC.   Treated patient's pain with ordered medications and will continue to monitor.

## 2023-04-27 NOTE — Progress Notes (Signed)
PROGRESS NOTE  [name] NWG:[id number] DOB: 1960-12-17 DOA: 04/15/2023 PCP: Patient, No Pcp Per  Brief History:  62 y.o. female with medical history significant of CAD s/p PCI, hypertension, hyperlipidemia, T2DM, DVT on Eliquis who presents to the emergency department with complaints of chest pain.  Patient complained of left-sided chest pain which occurred yesterday while sitting in a car chest pain was described as sharp and nonreproducible and was rated as 8/10 on pain scale with radiation to the back, this was associated with dizziness and nausea without vomiting.  She states that she called her PCP who advised her to go to the ED for further evaluation and management.  Patient denies shortness of breath, diaphoresis, fever.  The patient's troponins have been unremarkable.  Echocardiogram on 04/22/2023 showed EF 60 to 65%, trivial MR, normal RV EF.  However, patient continued to have intermittent chest pain throughout her hospitalization.  Her hospitalization was prolonged due to development of acute on chronic renal failure with serum creatinine up to 2.56.  Nephrology was consulted.  The patient was started on IV heparin for her persistent chest pain.  She received IV heparin for about a week and it was ultimately discontinued on 04/24/2023.  After discussion with nephrology, it was felt that the patient likely may have new renal baseline.  After discussion with the patient of the risk, benefits, and alternatives, all parties involved agreed to pursue left heart catheterization.   04/25/2023: Patient underwent cardiac catheterization today.  Cardiology input is appreciated.  Postcatheterization, patient reports seeing spots, with associated left-sided eye pain.  On further questioning, patient reports prior episodes of similar symptoms.  CT stroke protocol has been activated.  Patient is currently on aspirin and Plavix.  Neurology input is appreciated.  04/26/2023: Patient seen.  Patient  underwent right heart catheterization yesterday.  Pulmonary hypertension and volume overload noted.  Patient has been on IV diuresis.  Volume status looks better today.  Creatinine of 2.62 noted today.  Nephrology input is highly appreciated.  Patient also reports dyspnea on minimal exertion.    04/27/2023: Patient reports mild chest pain, described as sharp and dull.  Troponin is 5.  EKG has not revealed any significant ST-T wave changes.  Cardiology team is considering cardiac catheterization.  Nephrology input is highly appreciated.  Volume overload has improved significantly.  Cautious diuresis, considering anticipated contrast dye exposure.  Patient remains at risk for contrast dye associated nephropathy.  Patient is aware of this risk.  Assessment/Plan: Chest pain with high risk for cardiac etiology/unstable angina - appreciate cardiology team consultation - continue supportive measures, IV heparin infusion and high dose statin  - working to improve creatinine in case cath is approved - cardio team planning to premedicate for cath given history of contrast allergy -has some atypical features--requiring fentanyl IV pushes for pain -Patient has received 8 days of intravenous heparin.  This was discontinued on 04/24/2023. -04/22/2023 echo EF 60 to 65%, normal RV EF, trivial TR. -After discussion with nephrology and the patient, it was decided to go forward with left heart catheterization. 04/25/2023: Patient underwent cardiac catheterization today.  Awaiting full report. 04/26/2023: Patient continues to report chest pain.  Only right heart catheterization was done yesterday.  Cardiology is following patient.  Cardiology input is appreciated. 04/27/2023: Cardiology is directing care.  Possible cardiac catheterization tomorrow.   AKI on CKD stage 3b  -Baseline creatinine 1.2-1.4 -Serum creatinine peaking 2.53 - continue IV fluids  per renal - recheck renal panel - renal US done 8/3 with normal findings  reported  - urinalysis reassuring - appreciate nephrology consultation to assist with getting medically optimized for cath - CT stone study--negative hydronephrosis, bibasilar GGO--infx vs edema; trace bilateral pleural effusion, moderate stool burden -8/8--discussed with Dr. Nicolette Bang may have new renal baseline  -Patient agrees to move forward with left heart catheterization with the understanding of worsening renal function. 04/25/2023: Serum creatinine is 1.21 today.  Continue to monitor renal function closely. 04/26/2023: Nephrology input is appreciated.  Rising serum creatinine is noted.  Continue to monitor closely.  Avoid nephrotoxins.  Keep MAP greater than 65 mmHg.  Optimize volume.  Avoid hemodynamic instability. 04/27/2023: Nephrology is following.  Serum creatinine is 2.75 today.  pulmonary infiltrates -viral respiratory panel--negative -check COVID negative -check PCT < 0.10   Anemia in CKD  - Hg down to 7.7 with hydration - with her CAD, cardiology team want her Hg>8 - transfused 1 unit PRBC on 8/5 - Hg up to 9 after transfusion - follow   - hemoccult stools     RLQ abdominal pain - intermittent, lipase test came back normal - pt reports appendectomy and cholecystectomy - CT abd/pelvis without contrast ordered (due to contrast allergy and upcoming cath) - 8/2 CT AP with findings of hepatomegaly and steatosis of liver, no acute findings to explain abd pain - 8/6 CT renal protocol--negative hydronephrosis, bibasilar GGO--infx vs edema; trace bilateral pleural effusion, moderate stool burden  CAD -continue ASA/plavix, statin -s/p RCA PCI in [place] 2022 04/27/2023: Cardiology team is directing care.  Possible cardiac catheterization in the morning.   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 - increased prandial novolog to 14 units TID with meals - continue home detemir 50 units BID  - 04/15/23 A1C--9.1    Chronic atrial fibrillation - carvedilol for heart rate control - holding apixaban in anticipation for possible cath later this week  - IV heparin for anticoagulation>>stopped 8/8   Hyperlipidemia - atorvastatin 80 mg daily    Essential Hypertension  - resumed home carvedilol with good BP control    Morbid Obesity -BMI 45.26 -lifestyle modification       Family Communication:   no Family at bedside   Consultants:  renal, cards   Code Status:  FULL   DVT Prophylaxis:  IV Heparin     Procedures: As Listed in Progress Note Above   Antibiotics: None   Subjective: Patient continues to report chest pain..  Objective: Vitals:   04/26/23 2001 04/27/23 0013 04/27/23 0305 04/27/23 0638  BP: (!) 121/44 (!) 113/52 (!) 115/57   Pulse: 69 66 61   Resp: 18  18   Temp: 98.6 F (37 C)  98.7 F (37.1 C)   TempSrc: Oral  Oral   SpO2: 95% 98% 97%   Weight:    111.1 kg  Height:        Intake/Output Summary (Last 24 hours) at 04/27/2023 1532 Last data filed at 04/26/2023 2243 Gross per 24 hour  Intake 884.03 ml  Output --  Net 884.03 ml   Weight change:  Exam:  General:  Pt is morbidly obese.  Not in any distress.  Awake and alert.   HEENT: Patient is pale.  No jaundice.   Cardiovascular: S1-S2. Respiratory: Clear  to auscultation. Abdomen: Obese, soft and nontender. Neuro: Awake and alert.  Moves all extremities.    Extremities: Edema is improving.  Data Reviewed: I have personally reviewed following labs and imaging studies Basic Metabolic Panel: Recent Labs  Lab 04/21/23 0449 04/22/23 0432 04/23/23 0439 04/24/23 0447 04/25/23 0712 04/25/23 1402 04/25/23 1404 04/26/23 0238 04/27/23 0118  NA 136   < > 136 138 139 142 138 133* 136  K 4.3   < > 4.3 4.7 4.2 3.9 4.3 3.6 3.8  CL 111   < > 103 102 101  --   --  96* 97*  CO2 21*    < > 25 25 25   --   --  26 26  GLUCOSE 129*   < > 138* 187* 113*  --   --  126* 209*  BUN 18   < > 21* 23* 22*  --   --  27* 33*  CREATININE 2.25*   < > 2.53* 2.56* 2.52*  --   --  2.62* 2.75*  CALCIUM 7.7*   < > 8.3* 8.5* 8.9  --   --  8.7* 8.3*  MG 1.8  --   --   --   --   --   --   --   --   PHOS  --    < > 4.2 4.6 5.1*  --   --  5.3* 5.0*   < > = values in this interval not displayed.   Liver Function Tests: Recent Labs  Lab 04/23/23 0439 04/24/23 0447 04/25/23 0712 04/26/23 0238 04/27/23 0118  ALBUMIN 2.8* 2.9* 2.7* 2.8* 2.7*   No results for input(s): "LIPASE", "AMYLASE" in the last 168 hours.  No results for input(s): "AMMONIA" in the last 168 hours. Coagulation Profile: No results for input(s): "INR", "PROTIME" in the last 168 hours. CBC: Recent Labs  Lab 04/23/23 0439 04/24/23 0447 04/25/23 0712 04/25/23 1402 04/25/23 1404 04/26/23 0238 04/27/23 0118  WBC 3.9* 4.3 4.4  --   --  4.6 5.5  HGB 8.4* 9.0* 9.0* 7.8* 8.8* 8.6* 8.5*  HCT 27.4* 29.4* 28.8* 23.0* 26.0* 27.0* 27.6*  MCV 87.8 87.5 86.7  --   --  86.5 87.6  PLT 139* 155 151  --   --  161 173   Cardiac Enzymes: Recent Labs  Lab 04/23/23 0910  CKTOTAL 68   BNP: Invalid input(s): "POCBNP" CBG: Recent Labs  Lab 04/26/23 1559 04/26/23 2157 04/27/23 0303 04/27/23 0744 04/27/23 1140  GLUCAP 166* 151* 184* 167* 171*   HbA1C: No results for input(s): "HGBA1C" in the last 72 hours. Urine analysis:    Component Value Date/Time   COLORURINE YELLOW 04/21/2023 0726   APPEARANCEUR CLOUDY (A) 04/21/2023 0726   LABSPEC 1.006 04/21/2023 0726   PHURINE 5.0 04/21/2023 0726   GLUCOSEU NEGATIVE 04/21/2023 0726   HGBUR NEGATIVE 04/21/2023 0726   BILIRUBINUR NEGATIVE 04/21/2023 0726   KETONESUR NEGATIVE 04/21/2023 0726   PROTEINUR NEGATIVE 04/21/2023 0726   UROBILINOGEN 0.2 12/17/2011 2258   NITRITE NEGATIVE 04/21/2023 0726   LEUKOCYTESUR NEGATIVE 04/21/2023 0726   Sepsis  Labs: @LABRCNTIP (procalcitonin:4,lacticidven:4) ) Recent Results (from the past 240 hour(s))  Respiratory (~20 pathogens) panel by PCR     Status: None   Collection Time: 04/23/23  3:13 AM   Specimen: Nasopharyngeal Swab; Respiratory  Result Value Ref Range Status   Adenovirus NOT DETECTED NOT DETECTED Final   Coronavirus 229E NOT DETECTED NOT DETECTED Final    Comment: (NOTE) The Coronavirus on  the Respiratory Panel, DOES NOT test for the novel  Coronavirus (2019 nCoV)    Coronavirus HKU1 NOT DETECTED NOT DETECTED Final   Coronavirus NL63 NOT DETECTED NOT DETECTED Final   Coronavirus OC43 NOT DETECTED NOT DETECTED Final   Metapneumovirus NOT DETECTED NOT DETECTED Final   Rhinovirus / Enterovirus NOT DETECTED NOT DETECTED Final   Influenza A NOT DETECTED NOT DETECTED Final   Influenza B NOT DETECTED NOT DETECTED Final   Parainfluenza Virus 1 NOT DETECTED NOT DETECTED Final   Parainfluenza Virus 2 NOT DETECTED NOT DETECTED Final   Parainfluenza Virus 3 NOT DETECTED NOT DETECTED Final   Parainfluenza Virus 4 NOT DETECTED NOT DETECTED Final   Respiratory Syncytial Virus NOT DETECTED NOT DETECTED Final   Bordetella pertussis NOT DETECTED NOT DETECTED Final   Bordetella Parapertussis NOT DETECTED NOT DETECTED Final   Chlamydophila pneumoniae NOT DETECTED NOT DETECTED Final   Mycoplasma pneumoniae NOT DETECTED NOT DETECTED Final    Comment: Performed at [facility], [address] [place], [place] [address]  SARS Coronavirus 2 by RT PCR (hospital order, performed in [facility] hospital lab) *cepheid single result test* Anterior Nasal Swab     Status: None   Collection Time: 04/23/23  5:31 PM   Specimen: Anterior Nasal Swab  Result Value Ref Range Status   SARS Coronavirus 2 by RT PCR NEGATIVE NEGATIVE Final    Comment: (NOTE) SARS-CoV-2 target nucleic acids are NOT DETECTED.  The SARS-CoV-2 RNA is generally detectable in upper and lower respiratory specimens during the  acute phase of infection. The lowest concentration of SARS-CoV-2 viral copies this assay can detect is 250 copies / mL. A negative result does not preclude SARS-CoV-2 infection and should not be used as the sole basis for treatment or other patient management decisions.  A negative result may occur with improper specimen collection / handling, submission of specimen other than nasopharyngeal swab, presence of viral mutation(s) within the areas targeted by this assay, and inadequate number of viral copies (<250 copies / mL). A negative result must be combined with clinical observations, patient history, and epidemiological information.  Fact Sheet for Patients:   RoadLapTop.co.za  Fact Sheet for Healthcare Providers: http://kim-miller.com/  This test is not yet approved or  cleared by the [place] FDA and has been authorized for detection and/or diagnosis of SARS-CoV-2 by FDA under an Emergency Use Authorization (EUA).  This EUA will remain in effect (meaning this test can be used) for the duration of the COVID-19 declaration under Section 564(b)(1) of the Act, 21 U.S.C. section 360bbb-3(b)(1), unless the authorization is terminated or revoked sooner.  Performed at [facility], [address] [place], [place] [address]      Scheduled Meds:  aspirin EC  81 mg Oral Daily   atorvastatin  80 mg Oral q morning   carvedilol  12.5 mg Oral BID WC   clonazePAM  0.5 mg Oral TID   clopidogrel  75 mg Oral Daily   darbepoetin (ARANESP) injection - NON-DIALYSIS  60 mcg Subcutaneous Q Fri-1800   enoxaparin (LOVENOX) injection  120 mg Subcutaneous Q24H   gabapentin  300 mg Oral QHS   insulin aspart  0-15 Units Subcutaneous TID WC   insulin aspart  14 Units Subcutaneous TID WC   insulin detemir  50 Units Subcutaneous BID   isosorbide mononitrate  60 mg Oral q morning   lamoTRIgine  100 mg Oral BID   multivitamin with minerals  1 tablet Oral  Daily   polyethylene glycol  17 g Oral BID   senna-docusate  2 tablet Oral QHS   sertraline  100 mg Oral Daily   sodium chloride flush  3 mL Intravenous Q12H   Continuous Infusions:  sodium chloride 5 mL/hr at 04/27/23 0703   ferric gluconate (FERRLECIT) IVPB 250 mg (04/27/23 1135)    Procedures/Studies: CT HEAD CODE STROKE WO CONTRAST  Result Date: 04/25/2023 CLINICAL DATA:  Code stroke.  TIA.  Acute visual changes. EXAM: CT HEAD WITHOUT CONTRAST TECHNIQUE: Contiguous axial images were obtained from the base of the skull through the vertex without intravenous contrast. RADIATION DOSE REDUCTION: This exam was performed according to the departmental dose-optimization program which includes automated exposure control, adjustment of the mA and/or kV according to patient size and/or use of iterative reconstruction technique. COMPARISON:  CT head without contrast 02/08/2023 FINDINGS: Brain: No acute infarct, hemorrhage, or mass lesion is present. The ventricles are of normal size. No significant extraaxial fluid collection is present. The brainstem and cerebellum are within normal limits. Midline structures are within normal limits. Vascular: Atherosclerotic calcifications are present within the cavernous internal carotid arteries. No hyperdense vessel is present. Skull: Calvarium is intact. No focal lytic or blastic lesions are present. No significant extracranial soft tissue lesion is present. Sinuses/Orbits: Polyps are again noted in the left maxillary sinus. Increased mucosal thickening is present. The globes and orbits are within normal limits. IMPRESSION: 1. Normal CT appearance of the brain. 2. Increased mucosal thickening in the left maxillary sinus. The above was relayed via text pager to Dr. Viviann Spare on 04/25/2023 at 18:56 . Electronically Signed   By: Marin Roberts M.D.   On: 04/25/2023 18:56   CARDIAC CATHETERIZATION  Result Date: 04/25/2023   Hemodynamic findings consistent with mild  pulmonary hypertension. Mild elevation of right heart pressures   CT RENAL STONE STUDY  Result Date: 04/22/2023 CLINICAL DATA:  Left flank pain, nausea EXAM: CT ABDOMEN AND PELVIS WITHOUT CONTRAST TECHNIQUE: Multidetector CT imaging of the abdomen and pelvis was performed following the standard protocol without IV contrast. RADIATION DOSE REDUCTION: This exam was performed according to the departmental dose-optimization program which includes automated exposure control, adjustment of the mA and/or kV according to patient size and/or use of iterative reconstruction technique. COMPARISON:  04/18/2023 FINDINGS: Lower chest: Interval development of bibasilar peribronchovascular ground-glass pulmonary infiltrates likely related to multifocal infection or noncardiogenic pulmonary edema. Trace bilateral pleural effusions. Extensive multi-vessel coronary artery calcification. Global cardiac size within normal limits. Hepatobiliary: No focal liver abnormality is seen. Status post cholecystectomy. No biliary dilatation. Pancreas: Unremarkable. No pancreatic ductal dilatation or surrounding inflammatory changes. Spleen: Mild splenomegaly is stable with the spleen measuring 15.6 cm in greatest dimension. This appears slightly progressive since remote prior examination 10/07/2022. No intrasplenic lesions identified on this noncontrast examination. Adrenals/Urinary Tract: The adrenal glands are unremarkable. The kidneys are normal in size and position. 1-2 mm punctate nonobstructing calculi are noted within the interpolar and lower polar regions of the kidneys bilaterally. No hydronephrosis. Minimal nonspecific bilateral perinephric stranding. No perinephric fluid collections. No hydronephrosis. No ureteral calculi. The bladder is unremarkable. Stomach/Bowel: Moderate colonic stool burden without evidence of obstruction. Status post subtotal appendectomy. The stomach, small bowel, and large bowel are otherwise unremarkable. No  evidence of obstruction or focal inflammation. No free intraperitoneal gas or fluid. Vascular/Lymphatic: Aortic atherosclerosis. No enlarged abdominal or pelvic lymph nodes. Reproductive: Status post hysterectomy. No adnexal masses. Other: No abdominal wall hernia. Musculoskeletal: Osseous structures are age-appropriate. No  acute bone abnormality. No lytic or blastic bone lesion. IMPRESSION: 1. Interval development of bibasilar peribronchovascular ground-glass pulmonary infiltrates likely related to multifocal infection or noncardiogenic pulmonary edema. Trace bilateral pleural effusions. 2. Extensive multi-vessel coronary artery calcification. 3. Mild splenomegaly, progressive since remote prior examination of 10/07/2022. 4. Minimal bilateral nonobstructing nephrolithiasis. No urolithiasis. No hydronephrosis. 5. Moderate colonic stool burden without evidence of obstruction. 6. Aortic atherosclerosis. Aortic Atherosclerosis (ICD10-I70.0). Electronically Signed   By: Helyn Numbers M.D.   On: 04/22/2023 19:45   ECHOCARDIOGRAM LIMITED  Result Date: 04/22/2023    ECHOCARDIOGRAM LIMITED REPORT   Patient Name:   [name] Date of Exam: 04/22/2023 Medical Rec #:  [id number]      Height:       63.0 in Accession #:    [id number]     Weight:       245.0 lb Date of Birth:  Apr 14, 1961      BSA:          2.108 m Patient Age:    61 years       BP:           117/62 mmHg Patient Gender: F              HR:           67 bpm. [facility] Procedure: Limited Echo, Cardiac Doppler and Color Doppler Indications:    CHF                 IVC check  History:        Patient has prior history of Echocardiogram examinations, most                 recent 04/17/2023. CHF, CAD and Angina, Arrythmias:Atrial                 Fibrillation, Signs/Symptoms:Chest Pain; Risk                 Factors:Hypertension, Diabetes and Dyslipidemia. CKD.  Sonographer:    Mikki Harbor Referring Phys: [id number] VISHNU P MALLIPEDDI  Sonographer  Comments: Patient is obese. IMPRESSIONS  1. Limited Echo for IVC check.  2. Left ventricular ejection fraction, by estimation, is 60 to 65%. The left ventricle has normal function. Left ventricular diastolic function could not be evaluated.  3. Right ventricular systolic function is normal. The right ventricular size is normal. There is normal pulmonary artery systolic pressure.  4. The inferior vena cava is normal in size with <50% respiratory variability, suggesting right atrial pressure of 8 mmHg. FINDINGS  Left Ventricle: Left ventricular ejection fraction, by estimation, is 60 to 65%. The left ventricle has normal function. The left ventricular internal cavity size was normal in size. There is no left ventricular hypertrophy. Left ventricular diastolic function could not be evaluated. Right Ventricle: The right ventricular size is normal. Right ventricular systolic function is normal. There is normal pulmonary artery systolic pressure. The tricuspid regurgitant velocity is 2.52 m/s, and with an assumed right atrial pressure of 8 mmHg,  the estimated right ventricular systolic pressure is 33.4 mmHg. Left Atrium: Left atrial size was not assessed. Right Atrium: Right atrial size was not assessed. Pericardium: There is no evidence of pericardial effusion. Mitral Valve: The mitral valve is normal in structure. Tricuspid Valve: The tricuspid valve is normal in structure. Tricuspid valve regurgitation is trivial. Aortic Valve: The aortic valve is tricuspid. There is mild calcification of the aortic valve. There is mild thickening of  the aortic valve. There is mild aortic valve annular calcification. Pulmonic Valve: The pulmonic valve was normal in structure. Aorta: The aortic root is normal in size and structure. Venous: The inferior vena cava is normal in size with less than 50% respiratory variability, suggesting right atrial pressure of 8 mmHg. IAS/Shunts: The interatrial septum was not assessed. LEFT VENTRICLE  PLAX 2D LVIDd:         4.70 cm LVIDs:         3.00 cm LV PW:         1.20 cm LV IVS:        1.10 cm LVOT diam:     2.10 cm LVOT Area:     3.46 cm  LEFT ATRIUM         Index LA diam:    4.40 cm 2.09 cm/m   AORTA Ao Root diam: 3.10 cm TRICUSPID VALVE TR Peak grad:   25.4 mmHg TR Vmax:        252.00 cm/s  SHUNTS Systemic Diam: 2.10 cm Vishnu Priya Mallipeddi Electronically signed by Winfield Rast Mallipeddi Signature Date/Time: 04/22/2023/5:07:38 PM    Final    DG CHEST PORT 1 VIEW  Result Date: 04/21/2023 CLINICAL DATA:  Chest pain EXAM: PORTABLE CHEST 1 VIEW COMPARISON:  04/20/2023 FINDINGS: The heart size and mediastinal contours are within normal limits. Both lungs are clear. The visualized skeletal structures are unremarkable. IMPRESSION: No active disease. Electronically Signed   By: Charlett Nose M.D.   On: 04/21/2023 16:58   DG CHEST PORT 1 VIEW  Result Date: 04/20/2023 CLINICAL DATA:  [id number] Chest pain [id number] EXAM: PORTABLE CHEST - 1 VIEW COMPARISON:  04/15/2023 FINDINGS: Relatively low lung volumes with some crowding of bronchovascular structures centrally. No new infiltrate. No pneumothorax. Heart size and mediastinal contours are within normal limits. No effusion. Visualized bones unremarkable. IMPRESSION: Low lung volumes. No acute findings. Electronically Signed   By: Corlis Leak M.D.   On: 04/20/2023 10:03   US RENAL  Result Date: 04/19/2023 CLINICAL DATA:  Acute on chronic kidney injury EXAM: RENAL / URINARY TRACT ULTRASOUND COMPLETE COMPARISON:  CT abdomen pelvis 04/18/2023 FINDINGS: Right Kidney: Renal measurements: 9.4 x 4.9 x 4.4 cm = volume: 105 mL. Echogenicity within normal limits. No mass or hydronephrosis visualized. Left Kidney: Renal measurements: 11.6 x 5.6 x 5.0 cm = volume: 169 mL. Echogenicity within normal limits. No mass or hydronephrosis visualized. Bladder: Limited evaluation due to to collapsed configuration. Other: Diffuse increased echogenicity of the visualized portions of  the hepatic parenchyma are a nonspecific indicator of hepatocellular dysfunction, most commonly steatosis. IMPRESSION: No significant sonographic abnormality of the kidneys. Electronically Signed   By: Acquanetta Belling M.D.   On: 04/19/2023 10:55   CT ABDOMEN PELVIS WO CONTRAST  Result Date: 04/18/2023 CLINICAL DATA:  Right lower quadrant abdominal pain EXAM: CT ABDOMEN AND PELVIS WITHOUT CONTRAST TECHNIQUE: Multidetector CT imaging of the abdomen and pelvis was performed following the standard protocol without IV contrast. RADIATION DOSE REDUCTION: This exam was performed according to the departmental dose-optimization program which includes automated exposure control, adjustment of the mA and/or kV according to patient size and/or use of iterative reconstruction technique. COMPARISON:  None Available. FINDINGS: Lower chest: No acute findings. Coronary artery and aortic calcifications. Hepatobiliary: Prior cholecystectomy. Diffuse fatty infiltration of the liver. No focal hepatic abnormality. Mild hepatomegaly with craniocaudal length 23 cm. Pancreas: No focal abnormality or ductal dilatation. Spleen: Splenomegaly with a craniocaudal length of 14.5 cm. This is stable  since prior study. No focal abnormality. Adrenals/Urinary Tract: Punctate bilateral nonobstructing renal calculi. No ureteral stones or hydronephrosis. Adrenal glands and urinary bladder unremarkable. Stomach/Bowel: Stomach, large and small bowel grossly unremarkable. Vascular/Lymphatic: Aortic atherosclerosis. No evidence of aneurysm or adenopathy. Reproductive: Prior hysterectomy.  No adnexal masses. Other: No free fluid or free air. Musculoskeletal: No acute bony abnormality. IMPRESSION: Hepatic steatosis.  Hepatosplenomegaly. No acute findings in the abdomen or pelvis. Aortic atherosclerosis, coronary artery disease. Punctate bilateral nephrolithiasis.  No hydronephrosis. Electronically Signed   By: Charlett Nose M.D.   On: 04/18/2023 19:22    ECHOCARDIOGRAM COMPLETE  Result Date: 04/17/2023    ECHOCARDIOGRAM REPORT   Patient Name:   [name] Date of Exam: 04/17/2023 Medical Rec #:  [id number]      Height:       63.0 in Accession #:    [id number]     Weight:       245.0 lb Date of Birth:  1961-03-22      BSA:          2.108 m Patient Age:    61 years       BP:           136/73 mmHg Patient Gender: F              HR:           71 bpm. [facility] Procedure: 2D Echo, Cardiac Doppler and Color Doppler Indications:    Chest Pain R07.9  History:        Patient has no prior history of Echocardiogram examinations.                 CAD, Arrythmias:Atrial Fibrillation, Signs/Symptoms:Chest Pain;                 Risk Factors:Dyslipidemia, Hypertension and Diabetes.  Sonographer:    Aron Baba Referring Phys: [id number] Ellsworth Lennox  Sonographer Comments: Patient is obese. IMPRESSIONS  1. Left ventricular ejection fraction, by estimation, is 55 to 60%. The left ventricle has normal function. The left ventricle has no regional wall motion abnormalities. Left ventricular diastolic parameters are consistent with Grade I diastolic dysfunction (impaired relaxation).  2. Right ventricular systolic function is normal. The right ventricular size is normal. Tricuspid regurgitation signal is inadequate for assessing PA pressure.  3. The mitral valve is abnormal. Mild mitral valve regurgitation. No evidence of mitral stenosis.  4. The aortic valve is tricuspid. Aortic valve regurgitation is not visualized. No aortic stenosis is present.  5. Aortic dilatation noted. 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  6. The inferior vena cava is normal in size with greater than 50% respiratory variability, suggesting right atrial pressure of 3 mmHg. FINDINGS  Left Ventricle: Left ventricular ejection fraction, by estimation, is 55 to 60%. The left ventricle has normal function. The left ventricle has no regional wall motion  abnormalities. The left ventricular internal cavity size was normal in size. There is  no left ventricular hypertrophy. Left ventricular diastolic parameters are consistent with Grade I diastolic dysfunction (impaired relaxation). Normal left ventricular filling pressure. Right Ventricle: The right ventricular size is normal. Right vetricular wall thickness was not well visualized. Right ventricular systolic function is normal. Tricuspid regurgitation signal is inadequate for assessing PA pressure. Left Atrium: Left atrial size was normal in size. Right Atrium: Right atrial size was normal in size. Pericardium: There is no evidence of pericardial effusion. Mitral Valve: The  mitral valve is abnormal. Mild mitral valve regurgitation. No evidence of mitral valve stenosis. Tricuspid Valve: The tricuspid valve is normal in structure. Tricuspid valve regurgitation is trivial. No evidence of tricuspid stenosis. Aortic Valve: The aortic valve is tricuspid. Aortic valve regurgitation is not visualized. No aortic stenosis is present. Aortic valve mean gradient measures 5.4 mmHg. Aortic valve peak gradient measures 8.7 mmHg. Aortic valve area, by VTI measures 1.85 cm. Pulmonic Valve: The pulmonic valve was not well visualized. Pulmonic valve regurgitation is not visualized. No evidence of pulmonic stenosis. Aorta: The aortic root is normal in size and structure and aortic dilatation noted. 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 Venous: The inferior vena cava is normal in size with greater than 50% respiratory variability, suggesting right atrial pressure of 3 mmHg. IAS/Shunts: No atrial level shunt detected by color flow Doppler.  LEFT VENTRICLE PLAX 2D LVIDd:         5.00 cm   Diastology LVIDs:         3.60 cm   LV e' medial:    13.20 cm/s LV PW:         0.90 cm   LV E/e' medial:  5.8 LV IVS:        0.80 cm   LV e' lateral:   6.22 cm/s LVOT diam:     2.00 cm   LV E/e' lateral: 12.3 LV SV:          67 LV SV Index:   32 LVOT Area:     3.14 cm  RIGHT VENTRICLE RV S prime:     12.60 cm/s TAPSE (M-mode): 2.2 cm LEFT ATRIUM             Index        RIGHT ATRIUM           Index LA diam:        4.00 cm 1.90 cm/m   RA Area:     10.10 cm LA Vol (A2C):   69.3 ml 32.88 ml/m  RA Volume:   20.40 ml  9.68 ml/m LA Vol (A4C):   65.9 ml 31.26 ml/m LA Biplane Vol: 70.8 ml 33.59 ml/m  AORTIC VALVE AV Area (Vmax):    1.94 cm AV Area (Vmean):   1.69 cm AV Area (VTI):     1.85 cm AV Vmax:           147.81 cm/s AV Vmean:          109.081 cm/s AV VTI:            0.360 m AV Peak Grad:      8.7 mmHg AV Mean Grad:      5.4 mmHg LVOT Vmax:         91.34 cm/s LVOT Vmean:        58.622 cm/s LVOT VTI:          0.212 m LVOT/AV VTI ratio: 0.59  AORTA Ao Root diam: 3.40 cm Ao Asc diam:  3.60 cm MITRAL VALVE MV Area (PHT): 4.71 cm     SHUNTS MV Decel Time: 161 msec     Systemic VTI:  0.21 m MR Peak grad: 82.2 mmHg     Systemic Diam: 2.00 cm MR Vmax:      453.33 cm/s MV E velocity: 76.60 cm/s MV A velocity: 108.00 cm/s MV E/A ratio:  0.71 Dina Rich MD Electronically signed by Dina Rich MD Signature Date/Time: 04/17/2023/1:33:26 PM    Final  CT CHEST ABDOMEN PELVIS WO CONTRAST  Result Date: 04/16/2023 CLINICAL DATA:  Sharp chest pain radiating to back. Left-sided rib pain and right-sided abdominal pain. EXAM: CT CHEST, ABDOMEN AND PELVIS WITHOUT CONTRAST TECHNIQUE: Multidetector CT imaging of the chest, abdomen and pelvis was performed following the standard protocol without IV contrast. RADIATION DOSE REDUCTION: This exam was performed according to the departmental dose-optimization program which includes automated exposure control, adjustment of the mA and/or kV according to patient size and/or use of iterative reconstruction technique. COMPARISON:  10/07/2022. FINDINGS: CT CHEST FINDINGS Cardiovascular: The heart is normal in size and there is no pericardial effusion. Three-vessel coronary artery calcifications are  noted. There is atherosclerotic calcification of the aorta without evidence of aneurysm. The pulmonary trunk is normal in caliber. Mediastinum/Nodes: No mediastinal or hilar lymphadenopathy. Evaluation of the hila is limited due to lack of IV contrast. Coarse calcification is noted in the left lobe of the thyroid gland. The trachea and esophagus are within normal limits. Lungs/Pleura: Mild atelectasis is present bilaterally. No effusion or pneumothorax. Small calcified granuloma are present bilaterally. Musculoskeletal: Degenerative changes are present in the thoracic spine. No acute fracture is seen. CT ABDOMEN PELVIS FINDINGS Hepatobiliary: No focal liver abnormality is seen. The liver is mildly enlarged. No gallstones, gallbladder wall thickening, or biliary dilatation. Pancreas: Unremarkable. No pancreatic ductal dilatation or surrounding inflammatory changes. Spleen: The spleen is enlarged at 14.1 cm in length. No focal abnormality. Adrenals/Urinary Tract: The adrenal glands are within normal limits. A nonobstructive renal calculus is noted on the right. No hydroureteronephrosis bilaterally. The bladder is unremarkable. Stomach/Bowel: Stomach is within normal limits. Appendix is surgically absent. No evidence of bowel wall thickening, distention, or inflammatory changes. No free air or pneumatosis. Vascular/Lymphatic: Aortic atherosclerosis. No enlarged abdominal or pelvic lymph nodes. Reproductive: Status post hysterectomy. No adnexal masses. Other: No abdominopelvic ascites. Subcutaneous fat stranding is noted in the anterior abdominal wall in the left lower quadrant, unchanged from the prior exam. Musculoskeletal: Degenerative changes are present in the lumbar spine. No acute fracture. IMPRESSION: 1. No acute process in the chest, abdomen, or pelvis. 2. Nonobstructive right renal calculus. 3. Mild hepatosplenomegaly. 4. Subcutaneous fat stranding in the anterior abdominal wall in the left lower quadrant,  unchanged from the prior exam, possible cellulitis. 5. Coronary artery calcifications. 6. Aortic atherosclerosis. Electronically Signed   By: Thornell Sartorius M.D.   On: 04/16/2023 00:19   DG Chest 2 View  Result Date: 04/15/2023 CLINICAL DATA:  Left-sided chest pain for 2 days EXAM: CHEST - 2 VIEW COMPARISON:  03/30/2023 FINDINGS: Frontal and lateral views of the chest demonstrate an unremarkable cardiac silhouette. No airspace disease, effusion, or pneumothorax. No acute bony abnormality. IMPRESSION: 1. No acute intrathoracic process. Electronically Signed   By: Sharlet Salina M.D.   On: 04/15/2023 22:33   DG Chest 2 View  Result Date: 03/30/2023 CLINICAL DATA:  Chest pain EXAM: CHEST - 2 VIEW COMPARISON:  02/08/2023 FINDINGS: The heart size and mediastinal contours are within normal limits. Both lungs are clear. The visualized skeletal structures are unremarkable. IMPRESSION: No active cardiopulmonary disease. Electronically Signed   By: Ernie Avena M.D.   On: 03/30/2023 19:01    Barnetta Chapel, MD  [facility]  If 7PM-7AM, please contact night-coverage [email or web address] Password [facility] 04/27/2023, 3:32 PM   LOS: 10 days

## 2023-04-27 NOTE — Progress Notes (Signed)
Subjective:  still says she feels poorly -  having chest pian-  UOP not recorded well the last 24 hours-  remained on iv lasix-  weight down 6 pounds and no obvious edema-  BUN and crt both up -  now cardiology is maybe planning for left heart cath ?  -  does tell me she has been dizzy upon standing-  orthostatics were initially positive by pulse-  told cardiology that she is smothering  Objective Vital signs in last 24 hours: Vitals:   04/26/23 2001 04/27/23 0013 04/27/23 0305 04/27/23 0638  BP: (!) 121/44 (!) 113/52 (!) 115/57   Pulse: 69 66 61   Resp: 18  18   Temp: 98.6 F (37 C)  98.7 F (37.1 C)   TempSrc: Oral  Oral   SpO2: 95% 98% 97%   Weight:    111.1 kg  Height:       Weight change:   Intake/Output Summary (Last 24 hours) at 04/27/2023 0959 Last data filed at 04/26/2023 2243 Gross per 24 hour  Intake 884.03 ml  Output --  Net 884.03 ml    Assessment/ Plan: Pt is a 62 y.o. yo female with longstanding DM and HTN who was admitted on 04/15/2023 with CP  Assessment/Plan: 1. Renal-  baseline crt 1.1 to 1.4-  ( was 1.49 on 03/30/23) , on 7/30 1.9 upon admission and then progressed slowly to 2.5 on 8/7 and has really stayed there since.  UA bland and US unremarkable-  occasional lower BPs noted during course but not on ACE/ARB.  The plan was in place to get renal artery duplex for further workup.  There are no indications for dialysis so no plans for such-   she is nonoliguric.  orthostatics slightly positive by pulse 2. CV -  right heart cath 8/9 c/w some pulmonary HTN and volume overload-  had been diuresed and tolerated  well -    If cards felt strongly about pursuing left heart cath we would do whatever we needed to do -  not sure that she would require dialysis even then.  Difficult situation as I am not sure she will ever say that she feels good so difficult to interpret her sxms-  high dose IV lasix has been ordered again today based on her telling the cardiologist she was  smothering -  I called cards because I was not comfortable with that and if going to do left heart cath did not want her to go into that volume down-  they agreed to hold after one dose of iv lasix today 3. Anemia-  is not helping things-  have ordered iron and ESA  5. HTN/volume-  coreg 12.5 BID and imdur 60-  given lasix -  now that her crt is bumping-  was orthostatic by pulse I didn't feel comfortable with still high dose IV lasix-  spoke to cards and stopped for second dose   Cecille Aver    Labs: Basic Metabolic Panel: Recent Labs  Lab 04/25/23 0712 04/25/23 1402 04/25/23 1404 04/26/23 0238 04/27/23 0118  NA 139   < > 138 133* 136  K 4.2   < > 4.3 3.6 3.8  CL 101  --   --  96* 97*  CO2 25  --   --  26 26  GLUCOSE 113*  --   --  126* 209*  BUN 22*  --   --  27* 33*  CREATININE 2.52*  --   --  2.62* 2.75*  CALCIUM 8.9  --   --  8.7* 8.3*  PHOS 5.1*  --   --  5.3* 5.0*   < > = values in this interval not displayed.   Liver Function Tests: Recent Labs  Lab 04/25/23 0712 04/26/23 0238 04/27/23 0118  ALBUMIN 2.7* 2.8* 2.7*   No results for input(s): "LIPASE", "AMYLASE" in the last 168 hours. No results for input(s): "AMMONIA" in the last 168 hours. CBC: Recent Labs  Lab 04/23/23 0439 04/24/23 0447 04/25/23 0712 04/25/23 1402 04/25/23 1404 04/26/23 0238 04/27/23 0118  WBC 3.9* 4.3 4.4  --   --  4.6 5.5  HGB 8.4* 9.0* 9.0*   < > 8.8* 8.6* 8.5*  HCT 27.4* 29.4* 28.8*   < > 26.0* 27.0* 27.6*  MCV 87.8 87.5 86.7  --   --  86.5 87.6  PLT 139* 155 151  --   --  161 173   < > = values in this interval not displayed.   Cardiac Enzymes: Recent Labs  Lab 04/23/23 0910  CKTOTAL 68   CBG: Recent Labs  Lab 04/26/23 1147 04/26/23 1559 04/26/23 2157 04/27/23 0303 04/27/23 0744  GLUCAP 211* 166* 151* 184* 167*    Iron Studies: No results for input(s): "IRON", "TIBC", "TRANSFERRIN", "FERRITIN" in the last 72 hours. Studies/Results: CT HEAD CODE STROKE  WO CONTRAST  Result Date: 04/25/2023 CLINICAL DATA:  Code stroke.  TIA.  Acute visual changes. EXAM: CT HEAD WITHOUT CONTRAST TECHNIQUE: Contiguous axial images were obtained from the base of the skull through the vertex without intravenous contrast. RADIATION DOSE REDUCTION: This exam was performed according to the departmental dose-optimization program which includes automated exposure control, adjustment of the mA and/or kV according to patient size and/or use of iterative reconstruction technique. COMPARISON:  CT head without contrast 02/08/2023 FINDINGS: Brain: No acute infarct, hemorrhage, or mass lesion is present. The ventricles are of normal size. No significant extraaxial fluid collection is present. The brainstem and cerebellum are within normal limits. Midline structures are within normal limits. Vascular: Atherosclerotic calcifications are present within the cavernous internal carotid arteries. No hyperdense vessel is present. Skull: Calvarium is intact. No focal lytic or blastic lesions are present. No significant extracranial soft tissue lesion is present. Sinuses/Orbits: Polyps are again noted in the left maxillary sinus. Increased mucosal thickening is present. The globes and orbits are within normal limits. IMPRESSION: 1. Normal CT appearance of the brain. 2. Increased mucosal thickening in the left maxillary sinus. The above was relayed via text pager to Dr. Viviann Spare on 04/25/2023 at 18:56 . Electronically Signed   By: Marin Roberts M.D.   On: 04/25/2023 18:56   CARDIAC CATHETERIZATION  Result Date: 04/25/2023   Hemodynamic findings consistent with mild pulmonary hypertension. Mild elevation of right heart pressures   Medications: Infusions:  sodium chloride 5 mL/hr at 04/27/23 0703   ferric gluconate (FERRLECIT) IVPB Stopped (04/26/23 1915)    Scheduled Medications:  aspirin EC  81 mg Oral Daily   atorvastatin  80 mg Oral q morning   carvedilol  12.5 mg Oral BID WC   clonazePAM   0.5 mg Oral TID   clopidogrel  75 mg Oral Daily   darbepoetin (ARANESP) injection - NON-DIALYSIS  60 mcg Subcutaneous Q Fri-1800   enoxaparin (LOVENOX) injection  120 mg Subcutaneous Q24H   furosemide  80 mg Intravenous BID   gabapentin  300 mg Oral QHS   insulin aspart  0-15 Units Subcutaneous TID WC   insulin  aspart  14 Units Subcutaneous TID WC   insulin detemir  50 Units Subcutaneous BID   isosorbide mononitrate  60 mg Oral q morning   lamoTRIgine  100 mg Oral BID   multivitamin with minerals  1 tablet Oral Daily   polyethylene glycol  17 g Oral BID   senna-docusate  2 tablet Oral QHS   sertraline  100 mg Oral Daily   sodium chloride flush  3 mL Intravenous Q12H    have reviewed scheduled and prn medications.  Physical Exam: General: obese, no distress but says she feels terrible Heart: RRR Lungs: mostly clear Abdomen: obese, soft non tender Extremities:no obvious peripheral edema     04/27/2023,9:59 AM  LOS: 10 days

## 2023-04-28 ENCOUNTER — Encounter (HOSPITAL_COMMUNITY): Payer: Self-pay | Admitting: Cardiovascular Disease

## 2023-04-28 ENCOUNTER — Other Ambulatory Visit (HOSPITAL_COMMUNITY): Payer: Medicaid - Out of State

## 2023-04-28 DIAGNOSIS — I5032 Chronic diastolic (congestive) heart failure: Secondary | ICD-10-CM | POA: Diagnosis not present

## 2023-04-28 DIAGNOSIS — D649 Anemia, unspecified: Secondary | ICD-10-CM

## 2023-04-28 DIAGNOSIS — N179 Acute kidney failure, unspecified: Secondary | ICD-10-CM | POA: Diagnosis not present

## 2023-04-28 DIAGNOSIS — R0789 Other chest pain: Secondary | ICD-10-CM | POA: Diagnosis not present

## 2023-04-28 LAB — GLUCOSE, CAPILLARY
Glucose-Capillary: 121 mg/dL — ABNORMAL HIGH (ref 70–99)
Glucose-Capillary: 112 mg/dL — ABNORMAL HIGH (ref 70–99)
Glucose-Capillary: 171 mg/dL — ABNORMAL HIGH (ref 70–99)
Glucose-Capillary: 210 mg/dL — ABNORMAL HIGH (ref 70–99)
Glucose-Capillary: 175 mg/dL — ABNORMAL HIGH (ref 70–99)

## 2023-04-28 MED ORDER — ALUM & MAG HYDROXIDE-SIMETH 200-200-20 MG/5ML PO SUSP
30.0000 mL | Freq: Once | ORAL | Status: AC
  Administered 2023-04-28: 30 mL via ORAL
  Filled 2023-04-28: qty 30

## 2023-04-28 MED ORDER — HYOSCYAMINE SULFATE 0.125 MG SL SUBL
0.2500 mg | SUBLINGUAL_TABLET | Freq: Once | SUBLINGUAL | Status: AC
  Administered 2023-04-28: 0.25 mg via SUBLINGUAL
  Filled 2023-04-28: qty 2

## 2023-04-28 MED ORDER — AMLODIPINE BESYLATE 2.5 MG PO TABS
2.5000 mg | ORAL_TABLET | Freq: Every day | ORAL | Status: DC
  Administered 2023-04-28 – 2023-04-29 (×2): 2.5 mg via ORAL
  Filled 2023-04-28 (×2): qty 1

## 2023-04-28 NOTE — Progress Notes (Signed)
[facility] night cross cover note:   I was notified by RN with request for GI cocktail for the patient's chest discomfort.  This is in addition to existing order for prn IV fentanyl.  I subsequently placed order for GI cocktail x 1 dose now.    Holly Pigg, DO Hospitalist

## 2023-04-28 NOTE — Plan of Care (Signed)
  Problem: Health Behavior/Discharge Planning: Goal: Ability to identify and utilize available resources and services will improve Outcome: Progressing Goal: Ability to manage health-related needs will improve Outcome: Progressing   Problem: Metabolic: Goal: Ability to maintain appropriate glucose levels will improve Outcome: Progressing   Problem: Nutritional: Goal: Maintenance of adequate nutrition will improve Outcome: Progressing   Problem: Skin Integrity: Goal: Risk for impaired skin integrity will decrease Outcome: Progressing   Problem: Tissue Perfusion: Goal: Adequacy of tissue perfusion will improve Outcome: Progressing   Problem: Education: Goal: Knowledge of General Education information will improve Description: Including pain rating scale, medication(s)/side effects and non-pharmacologic comfort measures Outcome: Progressing

## 2023-04-28 NOTE — Progress Notes (Signed)
Mobility Specialist Progress Note:    04/28/23 1037  Mobility  Activity Ambulated with assistance in hallway  Level of Assistance Contact guard assist, steadying assist  Assistive Device Front wheel walker  Distance Ambulated (ft) 180 ft  Activity Response Tolerated well  Mobility Referral Yes  $Mobility charge 1 Mobility  Mobility Specialist Start Time (ACUTE ONLY) [id number]  Mobility Specialist Stop Time (ACUTE ONLY) 1000  Mobility Specialist Time Calculation (min) (ACUTE ONLY) 18 min   Pt received in bed, hesitant but agreeable to ambulate. Needed no physical assistance for STS. During ambulation pt c/o bilat leg pain and overall weakness. Towards EOS pt began to have a slight swaying gait and had difficulty keeping her eyes open. Took a seated break at about 138ft for about 2 mins to check vitals, VSS (see below). Pt offered to be wheeled back to room but requested to walk back. Pt situated back in bed w/ call bell and personal belongings at reach. All needs met.   During Mobility HR 73 SPO2 1L/min 98% BP 125/80   Thompson Grayer Mobility Specialist  Please contact vis Secure Chat or  Rehab Office [phone]

## 2023-04-28 NOTE — Progress Notes (Addendum)
Rounding Note    Patient Name: [name] Date of Encounter: 04/28/2023  [facility] Cardiologist: Marjo Bicker, MD   Subjective   Laying in bed on 2L [place]. Complains of short episodes of chest pain last night that resolved on their own  Inpatient Medications    Scheduled Meds:  aspirin EC  81 mg Oral Daily   atorvastatin  80 mg Oral q morning   carvedilol  12.5 mg Oral BID WC   clonazePAM  0.5 mg Oral TID   clopidogrel  75 mg Oral Daily   darbepoetin (ARANESP) injection - NON-DIALYSIS  60 mcg Subcutaneous Q Fri-1800   enoxaparin (LOVENOX) injection  120 mg Subcutaneous Q24H   gabapentin  300 mg Oral QHS   insulin aspart  0-15 Units Subcutaneous TID WC   insulin aspart  14 Units Subcutaneous TID WC   insulin detemir  50 Units Subcutaneous BID   isosorbide mononitrate  60 mg Oral q morning   lamoTRIgine  100 mg Oral BID   multivitamin with minerals  1 tablet Oral Daily   polyethylene glycol  17 g Oral BID   senna-docusate  2 tablet Oral QHS   sertraline  100 mg Oral Daily   sodium chloride flush  3 mL Intravenous Q12H   Continuous Infusions:  sodium chloride 5 mL/hr at 04/27/23 0703   ferric gluconate (FERRLECIT) IVPB 250 mg (04/27/23 1135)   PRN Meds: sodium chloride, acetaminophen **OR** acetaminophen, bisacodyl, diphenhydrAMINE, fentaNYL (SUBLIMAZE) injection, nitroGLYCERIN, ondansetron **OR** ondansetron (ZOFRAN) IV, mouth rinse, oxyCODONE, prochlorperazine, sodium chloride flush   Vital Signs    Vitals:   04/27/23 2049 04/28/23 0356 04/28/23 0659 04/28/23 0729  BP: 118/69   132/63  Pulse: 93 63  61  Resp:  17  18  Temp:  97.9 F (36.6 C)  98.1 F (36.7 C)  TempSrc:  Oral  Oral  SpO2: (!) 86% 95%  96%  Weight:   111.9 kg   Height:        Intake/Output Summary (Last 24 hours) at 04/28/2023 0756 Last data filed at 04/28/2023 0200 Gross per 24 hour  Intake 480 ml  Output 1000 ml  Net -520 ml      04/28/2023    6:59 AM 04/27/2023     6:38 AM 04/25/2023    5:58 AM  Last 3 Weights  Weight (lbs) 246 lb 12.8 oz 245 lb 251 lb 12.8 oz  Weight (kg) 111.948 kg 111.131 kg 114.216 kg      Telemetry    SR HR 50s-60s - Personally Reviewed  ECG    Sinus bradycardia HR 58, Non specific T wave changes  - Personally Reviewed  Physical Exam   GEN: No acute distress.   Neck: No JVD Cardiac: RRR, no murmurs, rubs, or gallops on 2L [place].  Respiratory: Clear to auscultation bilaterally. MS: No edema; No deformity. Neuro:  Nonfocal  Psych: Normal affect   Labs    High Sensitivity Troponin:   Recent Labs  Lab 04/22/23 0432 04/24/23 1221 04/24/23 1436 04/27/23 1608 04/27/23 1817  TROPONINIHS 4 4 4 5 5      Chemistry Recent Labs  Lab 04/26/23 0238 04/27/23 0118 04/28/23 0040  NA 133* 136 132*  K 3.6 3.8 3.8  CL 96* 97* 94*  CO2 26 26 27   GLUCOSE 126* 209* 187*  BUN 27* 33* 36*  CREATININE 2.62* 2.75* 2.78*  CALCIUM 8.7* 8.3* 8.1*  ALBUMIN 2.8* 2.7* 2.6*  GFRNONAA 20* 19* 19*  ANIONGAP 11 13 11     Hematology Recent Labs  Lab 04/26/23 0238 04/27/23 0118 04/28/23 0040  WBC 4.6 5.5 5.4  RBC 3.12* 3.15* 3.20*  HGB 8.6* 8.5* 8.7*  HCT 27.0* 27.6* 28.2*  MCV 86.5 87.6 88.1  MCH 27.6 27.0 27.2  MCHC 31.9 30.8 30.9  RDW 17.3* 17.9* 18.1*  PLT 161 173 179   BNP Recent Labs  Lab 04/21/23 1256 04/22/23 0432  BNP 183.0* 297.0*       Radiology    No results found.  Cardiac Studies   Echo 04/17/23 IMPRESSIONS    1. Left ventricular ejection fraction, by estimation, is 55 to 60%. The  left ventricle has normal function. The left ventricle has no regional  wall motion abnormalities. Left ventricular diastolic parameters are  consistent with Grade I diastolic  dysfunction (impaired relaxation).   2. Right ventricular systolic function is normal. The right ventricular  size is normal. Tricuspid regurgitation signal is inadequate for assessing  PA pressure.   3. The mitral valve is abnormal. Mild  mitral valve regurgitation. No  evidence of mitral stenosis.   4. The aortic valve is tricuspid. Aortic valve regurgitation is not  visualized. No aortic stenosis is present.   5. Aortic dilatation noted. There is mild dilatation of the ascending  aorta, measuring 36 mm.   6. The inferior vena cava is normal in size with greater than 50%  respiratory variability, suggesting right atrial pressure of 3 mmHg.   Limited Echo 04/22/23 IMPRESSIONS    1. Limited Echo for IVC check.   2. Left ventricular ejection fraction, by estimation, is 60 to 65%. The  left ventricle has normal function. Left ventricular diastolic function  could not be evaluated.   3. Right ventricular systolic function is normal. The right ventricular  size is normal. There is normal pulmonary artery systolic pressure.   4. The inferior vena cava is normal in size with <50% respiratory  variability, suggesting right atrial pressure of 8 mmHg.   RHC 04/25/23   Hemodynamic findings consistent with mild pulmonary hypertension.   Mild elevation of right heart pressures  Patient Profile     62 y.o. female with a history of CKD, CAD with PCI 2022, PAF on eliquis, HLD, DM2 with hyperglycemia, and anemia presented to [facility] with chest pain   Assessment & Plan    Chest pain -- presented with sharp left sided chest pain that radiated to the back associated with dizziness and nausea -- unclear etiology -- troponins negative, no significant EKG changes -- may be reasonable to do LHC considering no improvement in chest pain and CAD hx. Would need to be cautious with contrast given renal function.  -- pt is aware LHC may push her to dialysis and is accepting. Will review further with MD  Acute on chronic HFpEF -- no evidence of decreased output  -- echo grade 1 diastolic dysfunction -- + 2.1 L. 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 -- creatinine 2.78 this AM,  nephrology suspects this may be her new baseline -- will need to be cautious of using contrast  CAD -- Hx of NSTEMI with DES to the RCA in 2022 in [place] -- contrast allergy -- continue aspirin 81mg  daily, atorvastatin 80mg  daily, carvedilol 12.5mg  BID, plavix 75mg  daily, imdur 60mg  daily   Paroxsymal a-fib -- on chronic anticoagulation -- continue enoxaparin 120mg  daily   HLD -- continue atorvastatin  as above  DM2 -- continue SSI  For questions or updates, please contact [facility] Please consult [email or web address] for contact info under     Signed, Osborne Oman, RN, Student Nurse Practitioner 04/28/2023, 7:56 AM      Patient seen and examined. Agree with assessment and plan.  Patient currently is pain-free.  She admits to intermittent sharp stabbing type of chest pain.  ECG is unremarkable and does not show ischemic changes.  Troponins are negative.  Creatinine today is further increased to 2.78.  Presently, recommend an increase medical regimen rather than pursuing definitive cardiac catheterization.  Empirically initiate amlodipine 2.5 mg and consider increasing isosorbide to 60 mg in a.m. and 30 mg at night for empiric treatment.  Repeat ECG in a.m.   Lennette Bihari, MD, [facility] 04/28/2023 11:58 AM

## 2023-04-28 NOTE — Progress Notes (Signed)
   04/28/23 1050  Spiritual Encounters  Type of Visit Initial  Care provided to: Patient  Referral source Patient request  OnCall Visit No   Reason For Visit:    Chaplain responding to Pt's Spiritual Consult requesting prayer.  Interventions:     Chaplain practiced compassionate presence and worked to cultivate relationship of care and support; Empathetic/Reflective listening;  Explored spiritual/emotional needs and resources; provided anxiety containment; Identified, evaluated, and reinforced appropriate coping skills.  Outcomes:     Emotional/Spiritual resources identified and utilized; Vocalized purpose; Reduced anxious thoughts/progressed toward peace; Expressed ultimate hope in higher power Ephriam Knuckles)  Assessment:      Needs Pt lacks peace; deals with conflict in several family relationships; Lacks supportive friendships Resources Pt possesses faith; draws great joy from caring for her grandchildren Description Pt immediately expressed to chaplain her anxious thoughts concerning her heath, particularly her heart health.  She listed a number of close relatives who died near her same age from heart issues and she worries she will die as well.  She possesses a faith in God and it seems to help her some.  She self-identified the need to create a relationship in her life that she can use to process her deeper thoughts and worries with as she doesn't have a listening relationship in her life presently.  Plan:     This Chaplain will stop in at least one more time this week to listen to Pt and conduct prayer.   Chaplain services remain available by Spiritual Consult or for emergent cases, paging [phone]  Chaplain Raelene Bott, MDiv [email or web address]

## 2023-04-28 NOTE — Progress Notes (Signed)
PT Cancellation Note  Patient Details Name: [name] MRN: [id number] DOB: 20-Jun-1961   Cancelled Treatment:    Reason Eval/Treat Not Completed: Pain limiting ability to participate;Fatigue/lethargy limiting ability to participate - PT initially checked on pt at 1530, pt declined at that time given fatigue and pt requested PT come back in an hour. When PT arrived back to pt's room, pt again refused stating she was hurting too badly in her chest. PT notified RN staff, will check back tomorrow.  Marye Round, PT DPT Acute Rehabilitation Services Secure Chat Preferred  Office [phone]    Truddie Coco 04/28/2023, 4:41 PM

## 2023-04-28 NOTE — Progress Notes (Signed)
PROGRESS NOTE  [name] FUX:[id number] DOB: 1961-05-02 DOA: 04/15/2023 PCP: Patient, No Pcp Per  Brief History:  62 y.o. female with medical history significant of CAD s/p PCI, hypertension, hyperlipidemia, T2DM, DVT on Eliquis who presents to the emergency department with complaints of chest pain.  Patient complained of left-sided chest pain which occurred yesterday while sitting in a car chest pain was described as sharp and nonreproducible and was rated as 8/10 on pain scale with radiation to the back, this was associated with dizziness and nausea without vomiting.  She states that she called her PCP who advised her to go to the ED for further evaluation and management.  Patient denies shortness of breath, diaphoresis, fever.  The patient's troponins have been unremarkable.  Echocardiogram on 04/22/2023 showed EF 60 to 65%, trivial MR, normal RV EF.  However, patient continued to have intermittent chest pain throughout her hospitalization.  Her hospitalization was prolonged due to development of acute on chronic renal failure with serum creatinine up to 2.56.  Nephrology was consulted.  The patient was started on IV heparin for her persistent chest pain.  She received IV heparin for about a week and it was ultimately discontinued on 04/24/2023.  After discussion with nephrology, it was felt that the patient likely may have new renal baseline.  After discussion with the patient of the risk, benefits, and alternatives, all parties involved agreed to pursue left heart catheterization.   04/25/2023: Patient underwent cardiac catheterization today.  Cardiology input is appreciated.  Postcatheterization, patient reports seeing spots, with associated left-sided eye pain.  On further questioning, patient reports prior episodes of similar symptoms.  CT stroke protocol has been activated.  Patient is currently on aspirin and Plavix.    04/28/2023: Patient continues to report intermittent chest pain.   Input from cardiology and nephrology team is highly appreciated.  Systolic blood pressure has ranged from 86 to 118 mmHg over the last 24 hours.  Urine output of approximately 1000 cc.  Serum creatinine is 2.78 today (stable).  Lasix is on hold.  Decision to proceed with cardiac catheterization deferred to cardiology and nephrology team.  Assessment/Plan: Chest pain with high risk for cardiac etiology/unstable angina - appreciate cardiology team consultation - continue supportive measures, IV heparin infusion and high dose statin  - working to improve creatinine in case cath is approved - cardio team planning to premedicate for cath given history of contrast allergy -has some atypical features--requiring fentanyl IV pushes for pain -Patient has received 8 days of intravenous heparin.  This was discontinued on 04/24/2023. -04/22/2023 echo EF 60 to 65%, normal RV EF, trivial TR. -After discussion with nephrology and the patient, it was decided to go forward with left heart catheterization. 04/25/2023: Patient underwent cardiac catheterization today.  Awaiting full report. 04/26/2023: Patient continues to report chest pain.  Only right heart catheterization was done yesterday.  Cardiology is following patient.  Cardiology input is appreciated. 04/28/2023: Cardiology is directing care.  See above documentation.  AKI on CKD stage 3b  -Baseline creatinine 1.2-1.4 -Serum creatinine today is 2.78.   - renal US done 8/3 with normal findings reported  - urinalysis reassuring -Nephrology is directing care.   - CT stone study--negative hydronephrosis, bibasilar GGO--infx vs edema; trace bilateral pleural effusion, moderate stool burden  pulmonary infiltrates -viral respiratory panel--negative -check COVID negative -check PCT < 0.10   Anemia in CKD  - Hg down to 7.7 with hydration -  with her CAD, cardiology team want her Hg>8 - transfused 1 unit PRBC on 8/5 - Hg up to 9 after transfusion - follow   -  hemoccult stools     RLQ abdominal pain - intermittent, lipase test came back normal - pt reports appendectomy and cholecystectomy - CT abd/pelvis without contrast ordered (due to contrast allergy and upcoming cath) - 8/2 CT AP with findings of hepatomegaly and steatosis of liver, no acute findings to explain abd pain - 8/6 CT renal protocol--negative hydronephrosis, bibasilar GGO--infx vs edema; trace bilateral pleural effusion, moderate stool burden  CAD -continue ASA/plavix, statin -s/p RCA PCI in [place] 2022 04/27/2023: Cardiology team is directing care.  Possible cardiac catheterization in the morning.   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 - increased prandial novolog to 14 units TID with meals - continue home detemir 50 units BID  - 04/15/23 A1C--9.1    Chronic atrial fibrillation - carvedilol for heart rate control - holding apixaban in anticipation for possible cath later this week  - IV heparin for anticoagulation>>stopped 8/8   Hyperlipidemia - atorvastatin 80 mg daily    Essential Hypertension  - resumed home carvedilol with good BP control    Morbid Obesity -BMI 45.26 -lifestyle modification       Family Communication:   no Family at bedside   Consultants:  renal, cards   Code Status:  FULL   DVT Prophylaxis:  IV Heparin     Procedures: As Listed in Progress Note Above   Antibiotics: None   Subjective: Patient continues to report chest pain..  Objective: Vitals:   04/28/23 0659 04/28/23 0729 04/28/23 1539 04/28/23 1655  BP:  132/63  (!) 113/54  Pulse:  61    Resp:  18 18   Temp:  98.1 F (36.7 C) 98 F (36.7 C)   TempSrc:  Oral Oral   SpO2:  96%    Weight: 111.9 kg     Height:        Intake/Output Summary (Last 24 hours) at 04/28/2023 1731 Last data  filed at 04/28/2023 0200 Gross per 24 hour  Intake 480 ml  Output 1000 ml  Net -520 ml   Weight change: 0.816 kg Exam:  General:  Pt is morbidly obese.  Not in any distress.  Awake and alert.   HEENT: Patient is pale.  No jaundice.   Cardiovascular: S1-S2. Respiratory: Clear to auscultation. Abdomen: Obese, soft and nontender. Neuro: Awake and alert.  Moves all extremities.    Extremities: Edema is improving.  Data Reviewed: I have personally reviewed following labs and imaging studies Basic Metabolic Panel: Recent Labs  Lab 04/24/23 0447 04/25/23 0712 04/25/23 1402 04/25/23 1404 04/26/23 0238 04/27/23 0118 04/28/23 0040  NA 138 139 142 138 133* 136 132*  K 4.7 4.2 3.9 4.3 3.6 3.8 3.8  CL 102 101  --   --  96* 97* 94*  CO2 25 25  --   --  26 26 27   GLUCOSE 187* 113*  --   --  126* 209* 187*  BUN 23* 22*  --   --  27* 33* 36*  CREATININE 2.56* 2.52*  --   --  2.62* 2.75* 2.78*  CALCIUM 8.5* 8.9  --   --  8.7* 8.3* 8.1*  PHOS 4.6 5.1*  --   --  5.3* 5.0* 4.5   Liver Function Tests: Recent Labs  Lab 04/24/23 0447 04/25/23 0712 04/26/23 0238 04/27/23 0118 04/28/23 0040  ALBUMIN 2.9* 2.7* 2.8* 2.7* 2.6*   No results for input(s): "LIPASE", "AMYLASE" in the last 168 hours.  No results for input(s): "AMMONIA" in the last 168 hours. Coagulation Profile: No results for input(s): "INR", "PROTIME" in the last 168 hours. CBC: Recent Labs  Lab 04/24/23 0447 04/25/23 0712 04/25/23 1402 04/25/23 1404 04/26/23 0238 04/27/23 0118 04/28/23 0040  WBC 4.3 4.4  --   --  4.6 5.5 5.4  HGB 9.0* 9.0* 7.8* 8.8* 8.6* 8.5* 8.7*  HCT 29.4* 28.8* 23.0* 26.0* 27.0* 27.6* 28.2*  MCV 87.5 86.7  --   --  86.5 87.6 88.1  PLT 155 151  --   --  161 173 179   Cardiac Enzymes: Recent Labs  Lab 04/23/23 0910  CKTOTAL 68   BNP: Invalid input(s): "POCBNP" CBG: Recent Labs  Lab 04/27/23 2122 04/28/23 0352 04/28/23 0733 04/28/23 1130 04/28/23 1541  GLUCAP 188* 121* 112* 171*  210*   HbA1C: No results for input(s): "HGBA1C" in the last 72 hours. Urine analysis:    Component Value Date/Time   COLORURINE YELLOW 04/21/2023 0726   APPEARANCEUR CLOUDY (A) 04/21/2023 0726   LABSPEC 1.006 04/21/2023 0726   PHURINE 5.0 04/21/2023 0726   GLUCOSEU NEGATIVE 04/21/2023 0726   HGBUR NEGATIVE 04/21/2023 0726   BILIRUBINUR NEGATIVE 04/21/2023 0726   KETONESUR NEGATIVE 04/21/2023 0726   PROTEINUR NEGATIVE 04/21/2023 0726   UROBILINOGEN 0.2 12/17/2011 2258   NITRITE NEGATIVE 04/21/2023 0726   LEUKOCYTESUR NEGATIVE 04/21/2023 0726   Sepsis Labs: @LABRCNTIP (procalcitonin:4,lacticidven:4) ) Recent Results (from the past 240 hour(s))  Respiratory (~20 pathogens) panel by PCR     Status: None   Collection Time: 04/23/23  3:13 AM   Specimen: Nasopharyngeal Swab; Respiratory  Result Value Ref Range Status   Adenovirus NOT DETECTED NOT DETECTED Final   Coronavirus 229E NOT DETECTED NOT DETECTED Final    Comment: (NOTE) The Coronavirus on the Respiratory Panel, DOES NOT test for the novel  Coronavirus (2019 nCoV)    Coronavirus HKU1 NOT DETECTED NOT DETECTED Final   Coronavirus NL63 NOT DETECTED NOT DETECTED Final   Coronavirus OC43 NOT DETECTED NOT DETECTED Final   Metapneumovirus NOT DETECTED NOT DETECTED Final   Rhinovirus / Enterovirus NOT DETECTED NOT DETECTED Final   Influenza A NOT DETECTED NOT DETECTED Final   Influenza B NOT DETECTED NOT DETECTED Final   Parainfluenza Virus 1 NOT DETECTED NOT DETECTED Final   Parainfluenza Virus 2 NOT DETECTED NOT DETECTED Final   Parainfluenza Virus 3 NOT DETECTED NOT DETECTED Final   Parainfluenza Virus 4 NOT DETECTED NOT DETECTED Final   Respiratory Syncytial Virus NOT DETECTED NOT DETECTED Final   Bordetella pertussis NOT DETECTED NOT DETECTED Final   Bordetella Parapertussis NOT DETECTED NOT DETECTED Final   Chlamydophila pneumoniae NOT DETECTED NOT DETECTED Final   Mycoplasma pneumoniae NOT DETECTED NOT DETECTED Final     Comment: Performed at [facility], [address] [place], [place] [address]  SARS Coronavirus 2 by RT PCR (hospital order, performed in [facility] hospital lab) *cepheid single result test* Anterior Nasal Swab     Status: None   Collection Time: 04/23/23  5:31 PM   Specimen: Anterior Nasal Swab  Result Value Ref Range Status   SARS Coronavirus 2 by RT PCR NEGATIVE NEGATIVE Final    Comment: (NOTE) SARS-CoV-2 target nucleic acids are NOT  DETECTED.  The SARS-CoV-2 RNA is generally detectable in upper and lower respiratory specimens during the acute phase of infection. The lowest concentration of SARS-CoV-2 viral copies this assay can detect is 250 copies / mL. A negative result does not preclude SARS-CoV-2 infection and should not be used as the sole basis for treatment or other patient management decisions.  A negative result may occur with improper specimen collection / handling, submission of specimen other than nasopharyngeal swab, presence of viral mutation(s) within the areas targeted by this assay, and inadequate number of viral copies (<250 copies / mL). A negative result must be combined with clinical observations, patient history, and epidemiological information.  Fact Sheet for Patients:   RoadLapTop.co.za  Fact Sheet for Healthcare Providers: http://kim-miller.com/  This test is not yet approved or  cleared by the [place] FDA and has been authorized for detection and/or diagnosis of SARS-CoV-2 by FDA under an Emergency Use Authorization (EUA).  This EUA will remain in effect (meaning this test can be used) for the duration of the COVID-19 declaration under Section 564(b)(1) of the Act, 21 U.S.C. section 360bbb-3(b)(1), unless the authorization is terminated or revoked sooner.  Performed at [facility], [address], [place], [place] [address]      Scheduled Meds:  amLODipine  2.5 mg Oral Daily    aspirin EC  81 mg Oral Daily   atorvastatin  80 mg Oral q morning   carvedilol  12.5 mg Oral BID WC   clonazePAM  0.5 mg Oral TID   clopidogrel  75 mg Oral Daily   darbepoetin (ARANESP) injection - NON-DIALYSIS  60 mcg Subcutaneous Q Fri-1800   enoxaparin (LOVENOX) injection  120 mg Subcutaneous Q24H   gabapentin  300 mg Oral QHS   insulin aspart  0-15 Units Subcutaneous TID WC   insulin aspart  14 Units Subcutaneous TID WC   insulin detemir  50 Units Subcutaneous BID   isosorbide mononitrate  60 mg Oral q morning   lamoTRIgine  100 mg Oral BID   multivitamin with minerals  1 tablet Oral Daily   polyethylene glycol  17 g Oral BID   senna-docusate  2 tablet Oral QHS   sertraline  100 mg Oral Daily   sodium chloride flush  3 mL Intravenous Q12H   Continuous Infusions:  sodium chloride 5 mL/hr at 04/27/23 0703    Procedures/Studies: CT HEAD CODE STROKE WO CONTRAST  Result Date: 04/25/2023 CLINICAL DATA:  Code stroke.  TIA.  Acute visual changes. EXAM: CT HEAD WITHOUT CONTRAST TECHNIQUE: Contiguous axial images were obtained from the base of the skull through the vertex without intravenous contrast. RADIATION DOSE REDUCTION: This exam was performed according to the departmental dose-optimization program which includes automated exposure control, adjustment of the mA and/or kV according to patient size and/or use of iterative reconstruction technique. COMPARISON:  CT head without contrast 02/08/2023 FINDINGS: Brain: No acute infarct, hemorrhage, or mass lesion is present. The ventricles are of normal size. No significant extraaxial fluid collection is present. The brainstem and cerebellum are within normal limits. Midline structures are within normal limits. Vascular: Atherosclerotic calcifications are present within the cavernous internal carotid arteries. No hyperdense vessel is present. Skull: Calvarium is intact. No focal lytic or blastic lesions are present. No significant extracranial soft  tissue lesion is present. Sinuses/Orbits: Polyps are again noted in the left maxillary sinus. Increased mucosal thickening is present. The globes and orbits are within normal limits. IMPRESSION: 1. Normal CT appearance of the brain.  2. Increased mucosal thickening in the left maxillary sinus. The above was relayed via text pager to Dr. Viviann Spare on 04/25/2023 at 18:56 . Electronically Signed   By: Marin Roberts M.D.   On: 04/25/2023 18:56   CARDIAC CATHETERIZATION  Result Date: 04/25/2023   Hemodynamic findings consistent with mild pulmonary hypertension. Mild elevation of right heart pressures   CT RENAL STONE STUDY  Result Date: 04/22/2023 CLINICAL DATA:  Left flank pain, nausea EXAM: CT ABDOMEN AND PELVIS WITHOUT CONTRAST TECHNIQUE: Multidetector CT imaging of the abdomen and pelvis was performed following the standard protocol without IV contrast. RADIATION DOSE REDUCTION: This exam was performed according to the departmental dose-optimization program which includes automated exposure control, adjustment of the mA and/or kV according to patient size and/or use of iterative reconstruction technique. COMPARISON:  04/18/2023 FINDINGS: Lower chest: Interval development of bibasilar peribronchovascular ground-glass pulmonary infiltrates likely related to multifocal infection or noncardiogenic pulmonary edema. Trace bilateral pleural effusions. Extensive multi-vessel coronary artery calcification. Global cardiac size within normal limits. Hepatobiliary: No focal liver abnormality is seen. Status post cholecystectomy. No biliary dilatation. Pancreas: Unremarkable. No pancreatic ductal dilatation or surrounding inflammatory changes. Spleen: Mild splenomegaly is stable with the spleen measuring 15.6 cm in greatest dimension. This appears slightly progressive since remote prior examination 10/07/2022. No intrasplenic lesions identified on this noncontrast examination. Adrenals/Urinary Tract: The adrenal glands are  unremarkable. The kidneys are normal in size and position. 1-2 mm punctate nonobstructing calculi are noted within the interpolar and lower polar regions of the kidneys bilaterally. No hydronephrosis. Minimal nonspecific bilateral perinephric stranding. No perinephric fluid collections. No hydronephrosis. No ureteral calculi. The bladder is unremarkable. Stomach/Bowel: Moderate colonic stool burden without evidence of obstruction. Status post subtotal appendectomy. The stomach, small bowel, and large bowel are otherwise unremarkable. No evidence of obstruction or focal inflammation. No free intraperitoneal gas or fluid. Vascular/Lymphatic: Aortic atherosclerosis. No enlarged abdominal or pelvic lymph nodes. Reproductive: Status post hysterectomy. No adnexal masses. Other: No abdominal wall hernia. Musculoskeletal: Osseous structures are age-appropriate. No acute bone abnormality. No lytic or blastic bone lesion. IMPRESSION: 1. Interval development of bibasilar peribronchovascular ground-glass pulmonary infiltrates likely related to multifocal infection or noncardiogenic pulmonary edema. Trace bilateral pleural effusions. 2. Extensive multi-vessel coronary artery calcification. 3. Mild splenomegaly, progressive since remote prior examination of 10/07/2022. 4. Minimal bilateral nonobstructing nephrolithiasis. No urolithiasis. No hydronephrosis. 5. Moderate colonic stool burden without evidence of obstruction. 6. Aortic atherosclerosis. Aortic Atherosclerosis (ICD10-I70.0). Electronically Signed   By: Helyn Numbers M.D.   On: 04/22/2023 19:45   ECHOCARDIOGRAM LIMITED  Result Date: 04/22/2023    ECHOCARDIOGRAM LIMITED REPORT   Patient Name:   [name] Lean Date of Exam: 04/22/2023 Medical Rec #:  [id number]      Height:       63.0 in Accession #:    [id number]     Weight:       245.0 lb Date of Birth:  08/26/1961      BSA:          2.108 m Patient Age:    61 years       BP:           117/62 mmHg Patient Gender: F               HR:           67 bpm. [facility] Procedure: Limited Echo, Cardiac Doppler and Color Doppler Indications:    CHF  IVC check  History:        Patient has prior history of Echocardiogram examinations, most                 recent 04/17/2023. CHF, CAD and Angina, Arrythmias:Atrial                 Fibrillation, Signs/Symptoms:Chest Pain; Risk                 Factors:Hypertension, Diabetes and Dyslipidemia. CKD.  Sonographer:    Mikki Harbor Referring Phys: [id number] VISHNU P MALLIPEDDI  Sonographer Comments: Patient is obese. IMPRESSIONS  1. Limited Echo for IVC check.  2. Left ventricular ejection fraction, by estimation, is 60 to 65%. The left ventricle has normal function. Left ventricular diastolic function could not be evaluated.  3. Right ventricular systolic function is normal. The right ventricular size is normal. There is normal pulmonary artery systolic pressure.  4. The inferior vena cava is normal in size with <50% respiratory variability, suggesting right atrial pressure of 8 mmHg. FINDINGS  Left Ventricle: Left ventricular ejection fraction, by estimation, is 60 to 65%. The left ventricle has normal function. The left ventricular internal cavity size was normal in size. There is no left ventricular hypertrophy. Left ventricular diastolic function could not be evaluated. Right Ventricle: The right ventricular size is normal. Right ventricular systolic function is normal. There is normal pulmonary artery systolic pressure. The tricuspid regurgitant velocity is 2.52 m/s, and with an assumed right atrial pressure of 8 mmHg,  the estimated right ventricular systolic pressure is 33.4 mmHg. Left Atrium: Left atrial size was not assessed. Right Atrium: Right atrial size was not assessed. Pericardium: There is no evidence of pericardial effusion. Mitral Valve: The mitral valve is normal in structure. Tricuspid Valve: The tricuspid valve is normal in structure. Tricuspid valve  regurgitation is trivial. Aortic Valve: The aortic valve is tricuspid. There is mild calcification of the aortic valve. There is mild thickening of the aortic valve. There is mild aortic valve annular calcification. Pulmonic Valve: The pulmonic valve was normal in structure. Aorta: The aortic root is normal in size and structure. Venous: The inferior vena cava is normal in size with less than 50% respiratory variability, suggesting right atrial pressure of 8 mmHg. IAS/Shunts: The interatrial septum was not assessed. LEFT VENTRICLE PLAX 2D LVIDd:         4.70 cm LVIDs:         3.00 cm LV PW:         1.20 cm LV IVS:        1.10 cm LVOT diam:     2.10 cm LVOT Area:     3.46 cm  LEFT ATRIUM         Index LA diam:    4.40 cm 2.09 cm/m   AORTA Ao Root diam: 3.10 cm TRICUSPID VALVE TR Peak grad:   25.4 mmHg TR Vmax:        252.00 cm/s  SHUNTS Systemic Diam: 2.10 cm Vishnu Priya Mallipeddi Electronically signed by Winfield Rast Mallipeddi Signature Date/Time: 04/22/2023/5:07:38 PM    Final    DG CHEST PORT 1 VIEW  Result Date: 04/21/2023 CLINICAL DATA:  Chest pain EXAM: PORTABLE CHEST 1 VIEW COMPARISON:  04/20/2023 FINDINGS: The heart size and mediastinal contours are within normal limits. Both lungs are clear. The visualized skeletal structures are unremarkable. IMPRESSION: No active disease. Electronically Signed   By: Charlett Nose M.D.   On: 04/21/2023 16:58   DG  CHEST PORT 1 VIEW  Result Date: 04/20/2023 CLINICAL DATA:  [id number] Chest pain [id number] EXAM: PORTABLE CHEST - 1 VIEW COMPARISON:  04/15/2023 FINDINGS: Relatively low lung volumes with some crowding of bronchovascular structures centrally. No new infiltrate. No pneumothorax. Heart size and mediastinal contours are within normal limits. No effusion. Visualized bones unremarkable. IMPRESSION: Low lung volumes. No acute findings. Electronically Signed   By: Corlis Leak M.D.   On: 04/20/2023 10:03   US RENAL  Result Date: 04/19/2023 CLINICAL DATA:  Acute on  chronic kidney injury EXAM: RENAL / URINARY TRACT ULTRASOUND COMPLETE COMPARISON:  CT abdomen pelvis 04/18/2023 FINDINGS: Right Kidney: Renal measurements: 9.4 x 4.9 x 4.4 cm = volume: 105 mL. Echogenicity within normal limits. No mass or hydronephrosis visualized. Left Kidney: Renal measurements: 11.6 x 5.6 x 5.0 cm = volume: 169 mL. Echogenicity within normal limits. No mass or hydronephrosis visualized. Bladder: Limited evaluation due to to collapsed configuration. Other: Diffuse increased echogenicity of the visualized portions of the hepatic parenchyma are a nonspecific indicator of hepatocellular dysfunction, most commonly steatosis. IMPRESSION: No significant sonographic abnormality of the kidneys. Electronically Signed   By: Acquanetta Belling M.D.   On: 04/19/2023 10:55   CT ABDOMEN PELVIS WO CONTRAST  Result Date: 04/18/2023 CLINICAL DATA:  Right lower quadrant abdominal pain EXAM: CT ABDOMEN AND PELVIS WITHOUT CONTRAST TECHNIQUE: Multidetector CT imaging of the abdomen and pelvis was performed following the standard protocol without IV contrast. RADIATION DOSE REDUCTION: This exam was performed according to the departmental dose-optimization program which includes automated exposure control, adjustment of the mA and/or kV according to patient size and/or use of iterative reconstruction technique. COMPARISON:  None Available. FINDINGS: Lower chest: No acute findings. Coronary artery and aortic calcifications. Hepatobiliary: Prior cholecystectomy. Diffuse fatty infiltration of the liver. No focal hepatic abnormality. Mild hepatomegaly with craniocaudal length 23 cm. Pancreas: No focal abnormality or ductal dilatation. Spleen: Splenomegaly with a craniocaudal length of 14.5 cm. This is stable since prior study. No focal abnormality. Adrenals/Urinary Tract: Punctate bilateral nonobstructing renal calculi. No ureteral stones or hydronephrosis. Adrenal glands and urinary bladder unremarkable. Stomach/Bowel:  Stomach, large and small bowel grossly unremarkable. Vascular/Lymphatic: Aortic atherosclerosis. No evidence of aneurysm or adenopathy. Reproductive: Prior hysterectomy.  No adnexal masses. Other: No free fluid or free air. Musculoskeletal: No acute bony abnormality. IMPRESSION: Hepatic steatosis.  Hepatosplenomegaly. No acute findings in the abdomen or pelvis. Aortic atherosclerosis, coronary artery disease. Punctate bilateral nephrolithiasis.  No hydronephrosis. Electronically Signed   By: Charlett Nose M.D.   On: 04/18/2023 19:22   ECHOCARDIOGRAM COMPLETE  Result Date: 04/17/2023    ECHOCARDIOGRAM REPORT   Patient Name:   KHARLEE ALTOMARE Mcenaney Date of Exam: 04/17/2023 Medical Rec #:  [id number]      Height:       63.0 in Accession #:    [id number]     Weight:       245.0 lb Date of Birth:  [birth year]      BSA:          2.108 m Patient Age:    61 years       BP:           136/73 mmHg Patient Gender: F              HR:           71 bpm. Exam [facility] Procedure: 2D Echo, Cardiac Doppler and Color Doppler Indications:    Chest Pain R07.9  History:  Patient has no prior history of Echocardiogram examinations.                 CAD, Arrythmias:Atrial Fibrillation, Signs/Symptoms:Chest Pain;                 Risk Factors:Dyslipidemia, Hypertension and Diabetes.  Sonographer:    Aron Baba Referring Phys: [id number] Ellsworth Lennox  Sonographer Comments: Patient is obese. IMPRESSIONS  1. Left ventricular ejection fraction, by estimation, is 55 to 60%. The left ventricle has normal function. The left ventricle has no regional wall motion abnormalities. Left ventricular diastolic parameters are consistent with Grade I diastolic dysfunction (impaired relaxation).  2. Right ventricular systolic function is normal. The right ventricular size is normal. Tricuspid regurgitation signal is inadequate for assessing PA pressure.  3. The mitral valve is abnormal. Mild mitral valve regurgitation. No evidence of mitral  stenosis.  4. The aortic valve is tricuspid. Aortic valve regurgitation is not visualized. No aortic stenosis is present.  5. Aortic dilatation noted. 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  6. The inferior vena cava is normal in size with greater than 50% respiratory variability, suggesting right atrial pressure of 3 mmHg. FINDINGS  Left Ventricle: Left ventricular ejection fraction, by estimation, is 55 to 60%. The left ventricle has normal function. The left ventricle has no regional wall motion abnormalities. The left ventricular internal cavity size was normal in size. There is  no left ventricular hypertrophy. Left ventricular diastolic parameters are consistent with Grade I diastolic dysfunction (impaired relaxation). Normal left ventricular filling pressure. Right Ventricle: The right ventricular size is normal. Right vetricular wall thickness was not well visualized. Right ventricular systolic function is normal. Tricuspid regurgitation signal is inadequate for assessing PA pressure. Left Atrium: Left atrial size was normal in size. Right Atrium: Right atrial size was normal in size. Pericardium: There is no evidence of pericardial effusion. Mitral Valve: The mitral valve is abnormal. Mild mitral valve regurgitation. No evidence of mitral valve stenosis. Tricuspid Valve: The tricuspid valve is normal in structure. Tricuspid valve regurgitation is trivial. No evidence of tricuspid stenosis. Aortic Valve: The aortic valve is tricuspid. Aortic valve regurgitation is not visualized. No aortic stenosis is present. Aortic valve mean gradient measures 5.4 mmHg. Aortic valve peak gradient measures 8.7 mmHg. Aortic valve area, by VTI measures 1.85 cm. Pulmonic Valve: The pulmonic valve was not well visualized. Pulmonic valve regurgitation is not visualized. No evidence of pulmonic stenosis. Aorta: The aortic root is normal in size and structure and aortic dilatation noted. There is mild  dilatation of the ascending aorta, measuring 36 mm. Venous: The inferior vena cava is normal in size with greater than 50% respiratory variability, suggesting right atrial pressure of 3 mmHg. IAS/Shunts: No atrial level shunt detected by color flow Doppler.  LEFT VENTRICLE PLAX 2D LVIDd:         5.00 cm   Diastology LVIDs:         3.60 cm   LV e' medial:    13.20 cm/s LV PW:         0.90 cm   LV E/e' medial:  5.8 LV IVS:        0.80 cm   LV e' lateral:   6.22 cm/s LVOT diam:     2.00 cm   LV E/e' lateral: 12.3 LV SV:         67 LV SV Index:   32 LVOT Area:     3.14 cm  RIGHT VENTRICLE RV S prime:     12.60 cm/s TAPSE (M-mode): 2.2 cm LEFT ATRIUM             Index        RIGHT ATRIUM           Index LA diam:        4.00 cm 1.90 cm/m   RA Area:     10.10 cm LA Vol (A2C):   69.3 ml 32.88 ml/m  RA Volume:   20.40 ml  9.68 ml/m LA Vol (A4C):   65.9 ml 31.26 ml/m LA Biplane Vol: 70.8 ml 33.59 ml/m  AORTIC VALVE AV Area (Vmax):    1.94 cm AV Area (Vmean):   1.69 cm AV Area (VTI):     1.85 cm AV Vmax:           147.81 cm/s AV Vmean:          109.081 cm/s AV VTI:            0.360 m AV Peak Grad:      8.7 mmHg AV Mean Grad:      5.4 mmHg LVOT Vmax:         91.34 cm/s LVOT Vmean:        58.622 cm/s LVOT VTI:          0.212 m LVOT/AV VTI ratio: 0.59  AORTA Ao Root diam: 3.40 cm Ao Asc diam:  3.60 cm MITRAL VALVE MV Area (PHT): 4.71 cm     SHUNTS MV Decel Time: 161 msec     Systemic VTI:  0.21 m MR Peak grad: 82.2 mmHg     Systemic Diam: 2.00 cm MR Vmax:      453.33 cm/s MV E velocity: 76.60 cm/s MV A velocity: 108.00 cm/s MV E/A ratio:  0.71 Dina Rich MD Electronically signed by Dina Rich MD Signature Date/Time: 04/17/2023/1:33:26 PM    Final    CT CHEST ABDOMEN PELVIS WO CONTRAST  Result Date: 04/16/2023 CLINICAL DATA:  Sharp chest pain radiating to back. Left-sided rib pain and right-sided abdominal pain. EXAM: CT CHEST, ABDOMEN AND PELVIS WITHOUT CONTRAST TECHNIQUE: Multidetector CT imaging of the  chest, abdomen and pelvis was performed following the standard protocol without IV contrast. RADIATION DOSE REDUCTION: This exam was performed according to the departmental dose-optimization program which includes automated exposure control, adjustment of the mA and/or kV according to patient size and/or use of iterative reconstruction technique. COMPARISON:  10/07/2022. FINDINGS: CT CHEST FINDINGS Cardiovascular: The heart is normal in size and there is no pericardial effusion. Three-vessel coronary artery calcifications are noted. There is atherosclerotic calcification of the aorta without evidence of aneurysm. The pulmonary trunk is normal in caliber. Mediastinum/Nodes: No mediastinal or hilar lymphadenopathy. Evaluation of the hila is limited due to lack of IV contrast. Coarse calcification is noted in the left lobe of the thyroid gland. The trachea and esophagus are within normal limits. Lungs/Pleura: Mild atelectasis is present bilaterally. No effusion or pneumothorax. Small calcified granuloma are present bilaterally. Musculoskeletal: Degenerative changes are present in the thoracic spine. No acute fracture is seen. CT ABDOMEN PELVIS FINDINGS Hepatobiliary: No focal liver abnormality is seen. The liver is mildly enlarged. No gallstones, gallbladder wall thickening, or biliary dilatation. Pancreas: Unremarkable. No pancreatic ductal dilatation or surrounding inflammatory changes. Spleen: The spleen is enlarged at 14.1 cm in length. No focal abnormality. Adrenals/Urinary Tract: The adrenal glands are within normal limits. A nonobstructive renal calculus is noted on the right. No hydroureteronephrosis  bilaterally. The bladder is unremarkable. Stomach/Bowel: Stomach is within normal limits. Appendix is surgically absent. No evidence of bowel wall thickening, distention, or inflammatory changes. No free air or pneumatosis. Vascular/Lymphatic: Aortic atherosclerosis. No enlarged abdominal or pelvic lymph nodes.  Reproductive: Status post hysterectomy. No adnexal masses. Other: No abdominopelvic ascites. Subcutaneous fat stranding is noted in the anterior abdominal wall in the left lower quadrant, unchanged from the prior exam. Musculoskeletal: Degenerative changes are present in the lumbar spine. No acute fracture. IMPRESSION: 1. No acute process in the chest, abdomen, or pelvis. 2. Nonobstructive right renal calculus. 3. Mild hepatosplenomegaly. 4. Subcutaneous fat stranding in the anterior abdominal wall in the left lower quadrant, unchanged from the prior exam, possible cellulitis. 5. Coronary artery calcifications. 6. Aortic atherosclerosis. Electronically Signed   By: Thornell Sartorius M.D.   On: 04/16/2023 00:19   DG Chest 2 View  Result Date: 04/15/2023 CLINICAL DATA:  Left-sided chest pain for 2 days EXAM: CHEST - 2 VIEW COMPARISON:  03/30/2023 FINDINGS: Frontal and lateral views of the chest demonstrate an unremarkable cardiac silhouette. No airspace disease, effusion, or pneumothorax. No acute bony abnormality. IMPRESSION: 1. No acute intrathoracic process. Electronically Signed   By: Sharlet Salina M.D.   On: 04/15/2023 22:33   DG Chest 2 View  Result Date: 03/30/2023 CLINICAL DATA:  Chest pain EXAM: CHEST - 2 VIEW COMPARISON:  02/08/2023 FINDINGS: The heart size and mediastinal contours are within normal limits. Both lungs are clear. The visualized skeletal structures are unremarkable. IMPRESSION: No active cardiopulmonary disease. Electronically Signed   By: Ernie Avena M.D.   On: 03/30/2023 19:01    Barnetta Chapel, MD  [facility]  If 7PM-7AM, please contact night-coverage [email or web address] Password [facility] 04/28/2023, 5:31 PM   LOS: 11 days

## 2023-04-28 NOTE — Progress Notes (Signed)
Pt is a 62 y.o. yo female with longstanding DM and HTN who was admitted on 04/15/2023 with CP   Assessment/Plan: 1. Renal-  baseline crt 1.1 to 1.4-  ( was 1.49 on 03/30/23) , on 7/30 1.9 upon admission and then progressed to 2.5-2.75.  UA bland and US unremarkable-  occasional lower BPs noted during course but not on ACE/ARB.  The plan was in place to get renal artery duplex for further workup.  There are no indications for dialysis so no plans for such-   she is nonoliguric (1L/24h).  orthostatics slightly positive by pulse. Last dose of Lasix on 8/11 w/ 80mg .   If renal function continues to worsen in the AM will restart Lasix 80mg  BID. Unfortunately she has been smoldering with slowly worsening renal function during this hospitalization.  2. CV -  right heart cath 8/9 c/w some pulmonary HTN and volume overload-  had been diuresed and tolerated  well -    If cards felt strongly about pursuing left heart cath we would do whatever we needed to do -  not sure that she would require dialysis even then.  Difficult situation as I am not sure she will ever say that she feels good so difficult to interpret her sxms.   3. Anemia-  is not helping things-  have ordered iron and ESA  5. HTN/volume-  coreg 12.5 BID and imdur 60-  given lasix -  now that her crt is bumping-  was orthostatic by pulse   Subjective:  still says she feels poorly -  had  chest pain overnight which resolved on its own.  UOP 1L /24hrs.  weight same as at admission and I&O +2.1L   Cardiology may be planning for left heart cath ?  -  does tell me she has been dizzy upon standing and certainly at end of PT session.    Objective Vital signs in last 24 hours: Vitals:   04/27/23 2049 04/28/23 0356 04/28/23 0659 04/28/23 0729  BP: 118/69   132/63  Pulse: 93 63  61  Resp:  17  18  Temp:  97.9 F (36.6 C)  98.1 F (36.7 C)  TempSrc:  Oral  Oral  SpO2: (!) 86% 95%  96%  Weight:   111.9 kg   Height:       Weight change: 0.816  kg  Intake/Output Summary (Last 24 hours) at 04/28/2023 1313 Last data filed at 04/28/2023 0200 Gross per 24 hour  Intake 480 ml  Output 1000 ml  Net -520 ml      ,  W    Labs: Basic Metabolic Panel: Recent Labs  Lab 04/26/23 0238 04/27/23 0118 04/28/23 0040  NA 133* 136 132*  K 3.6 3.8 3.8  CL 96* 97* 94*  CO2 26 26 27   GLUCOSE 126* 209* 187*  BUN 27* 33* 36*  CREATININE 2.62* 2.75* 2.78*  CALCIUM 8.7* 8.3* 8.1*  PHOS 5.3* 5.0* 4.5   Liver Function Tests: Recent Labs  Lab 04/26/23 0238 04/27/23 0118 04/28/23 0040  ALBUMIN 2.8* 2.7* 2.6*   No results for input(s): "LIPASE", "AMYLASE" in the last 168 hours. No results for input(s): "AMMONIA" in the last 168 hours. CBC: Recent Labs  Lab 04/24/23 0447 04/25/23 0712 04/25/23 1402 04/26/23 0238 04/27/23 0118 04/28/23 0040  WBC 4.3 4.4  --  4.6 5.5 5.4  HGB 9.0* 9.0*   < > 8.6* 8.5* 8.7*  HCT 29.4* 28.8*   < > 27.0* 27.6* 28.2*  MCV 87.5 86.7  --  86.5 87.6 88.1  PLT 155 151  --  161 173 179   < > = values in this interval not displayed.   Cardiac Enzymes: Recent Labs  Lab 04/23/23 0910  CKTOTAL 68   CBG: Recent Labs  Lab 04/27/23 1603 04/27/23 2122 04/28/23 0352 04/28/23 0733 04/28/23 1130  GLUCAP 128* 188* 121* 112* 171*    Iron Studies: No results for input(s): "IRON", "TIBC", "TRANSFERRIN", "FERRITIN" in the last 72 hours. Studies/Results: No results found. Medications: Infusions:  sodium chloride 5 mL/hr at 04/27/23 0703    Scheduled Medications:  amLODipine  2.5 mg Oral Daily   aspirin EC  81 mg Oral Daily   atorvastatin  80 mg Oral q morning   carvedilol  12.5 mg Oral BID WC   clonazePAM  0.5 mg Oral TID   clopidogrel  75 mg Oral Daily   darbepoetin (ARANESP) injection - NON-DIALYSIS  60 mcg Subcutaneous Q Fri-1800   enoxaparin (LOVENOX) injection  120 mg Subcutaneous Q24H   gabapentin  300 mg Oral QHS   insulin aspart  0-15 Units Subcutaneous TID WC   insulin aspart   14 Units Subcutaneous TID WC   insulin detemir  50 Units Subcutaneous BID   isosorbide mononitrate  60 mg Oral q morning   lamoTRIgine  100 mg Oral BID   multivitamin with minerals  1 tablet Oral Daily   polyethylene glycol  17 g Oral BID   senna-docusate  2 tablet Oral QHS   sertraline  100 mg Oral Daily   sodium chloride flush  3 mL Intravenous Q12H    have reviewed scheduled and prn medications.  Physical Exam: General: obese, no distress but says she feels terrible Heart: RRR Lungs: mostly clear Abdomen: obese, soft non tender Extremities:no obvious peripheral edema     04/28/2023,1:13 PM  LOS: 11 days

## 2023-04-29 DIAGNOSIS — R0789 Other chest pain: Secondary | ICD-10-CM | POA: Diagnosis not present

## 2023-04-29 DIAGNOSIS — R079 Chest pain, unspecified: Secondary | ICD-10-CM | POA: Diagnosis not present

## 2023-04-29 DIAGNOSIS — N179 Acute kidney failure, unspecified: Secondary | ICD-10-CM | POA: Diagnosis not present

## 2023-04-29 DIAGNOSIS — E782 Mixed hyperlipidemia: Secondary | ICD-10-CM | POA: Diagnosis not present

## 2023-04-29 LAB — GLUCOSE, CAPILLARY
Glucose-Capillary: 122 mg/dL — ABNORMAL HIGH (ref 70–99)
Glucose-Capillary: 112 mg/dL — ABNORMAL HIGH (ref 70–99)
Glucose-Capillary: 161 mg/dL — ABNORMAL HIGH (ref 70–99)
Glucose-Capillary: 227 mg/dL — ABNORMAL HIGH (ref 70–99)
Glucose-Capillary: 145 mg/dL — ABNORMAL HIGH (ref 70–99)

## 2023-04-29 MED ORDER — FUROSEMIDE 10 MG/ML IJ SOLN
40.0000 mg | Freq: Two times a day (BID) | INTRAMUSCULAR | Status: DC
  Administered 2023-04-29 – 2023-05-02 (×7): 40 mg via INTRAVENOUS
  Filled 2023-04-29 (×7): qty 4

## 2023-04-29 MED ORDER — EZETIMIBE 10 MG PO TABS
10.0000 mg | ORAL_TABLET | Freq: Every day | ORAL | Status: DC
  Administered 2023-04-29 – 2023-05-03 (×5): 10 mg via ORAL
  Filled 2023-04-29 (×5): qty 1

## 2023-04-29 MED ORDER — ALUM & MAG HYDROXIDE-SIMETH 200-200-20 MG/5ML PO SUSP
30.0000 mL | ORAL | Status: DC | PRN
  Administered 2023-04-29: 30 mL via ORAL
  Filled 2023-04-29: qty 30

## 2023-04-29 MED ORDER — FUROSEMIDE 10 MG/ML IJ SOLN: 40.0000 mg | Freq: Two times a day (BID) | INTRAMUSCULAR | Status: DC

## 2023-04-29 MED ORDER — AMLODIPINE BESYLATE 5 MG PO TABS
5.0000 mg | ORAL_TABLET | Freq: Every day | ORAL | Status: DC
  Administered 2023-04-30 – 2023-05-02 (×3): 5 mg via ORAL
  Filled 2023-04-29 (×3): qty 1

## 2023-04-29 NOTE — Progress Notes (Signed)
ANTICOAGULATION CONSULT NOTE - Follow-up Consult  Pharmacy Consult for lovenox Indication: atrial fibrillation  Allergies  Allergen Reactions   Solu-Medrol [Methylprednisolone Sodium Succ] Shortness Of Breath   Contrast Media [Iodinated Contrast Media] Itching and Swelling   Doxycycline Hives   Ibuprofen Other (See Comments)    GI upset   Keflex [Cephalexin] Nausea And Vomiting   Metformin And Related Nausea Only   Nitroglycerin Nausea And Vomiting   Nsaids Other (See Comments)    Stomach pain/bleeding   Shellfish Allergy Itching and Swelling   Sulfa Antibiotics Nausea And Vomiting   Toradol [Ketorolac Tromethamine] Hives   Bentyl [Dicyclomine Hcl] Anxiety   Blueberry Flavor Nausea And Vomiting and Rash   Nitrofurantoin Monohyd Macro Itching   Strawberry Flavor Nausea And Vomiting and Rash    Patient Measurements: Height: 5\' 3"  (160 cm) Weight: 112.8 kg (248 lb 9.6 oz) IBW/kg (Calculated) : 52.4   Vital Signs: Temp: 98 F (36.7 C) (08/13 0816) Temp Source: Oral (08/13 0816) BP: 133/67 (08/13 0816) Pulse Rate: 58 (08/13 0816)  Labs: Recent Labs    04/27/23 0118 04/27/23 1608 04/27/23 1817 04/28/23 0040 04/29/23 0103  HGB 8.5*  --   --  8.7* 8.7*  HCT 27.6*  --   --  28.2* 28.3*  PLT 173  --   --  179 182  CREATININE 2.75*  --   --  2.78* 2.76*  TROPONINIHS  --  5 5  --   --     Estimated Creatinine Clearance: 26.2 mL/min (A) (by C-G formula based on SCr of 2.76 mg/dL (H)).   Medical History: Past Medical History:  Diagnosis Date   Asthma    Atrial fibrillation (HCC)    Bipolar 1 disorder (HCC)    Chronic back pain    Chronic chest pain    Diabetes mellitus without complication (HCC)    Gastroparesis    GERD (gastroesophageal reflux disease)    Hyperlipemia    Hypertension    IBS (irritable bowel syndrome)    Normal cardiac stress test 02/2014   [facility]    Medications:  Scheduled:   [START ON 04/30/2023] amLODipine  5 mg Oral Daily    atorvastatin  80 mg Oral q morning   carvedilol  12.5 mg Oral BID WC   clonazePAM  0.5 mg Oral TID   clopidogrel  75 mg Oral Daily   darbepoetin (ARANESP) injection - NON-DIALYSIS  60 mcg Subcutaneous Q Fri-1800   enoxaparin (LOVENOX) injection  120 mg Subcutaneous Q24H   ezetimibe  10 mg Oral Daily   furosemide  40 mg Intravenous BID   gabapentin  300 mg Oral QHS   insulin aspart  0-15 Units Subcutaneous TID WC   insulin aspart  14 Units Subcutaneous TID WC   insulin detemir  50 Units Subcutaneous BID   isosorbide mononitrate  60 mg Oral q morning   lamoTRIgine  100 mg Oral BID   multivitamin with minerals  1 tablet Oral Daily   polyethylene glycol  17 g Oral BID   senna-docusate  2 tablet Oral QHS   sertraline  100 mg Oral Daily   sodium chloride flush  3 mL Intravenous Q12H   Infusions:   sodium chloride Stopped (04/28/23 1915)    Assessment: 62 yo female patient presenting with CP.  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  Pharmacy consulted for Lovenox dosing with plans for [facility] on Monday.    No  plans for [facility] in setting of renal dysfunction (likely new baseline) and potential of progressing to dialysis.   Goal of Therapy:  Monitor platelets by anticoagulation protocol: Yes   Plan:  Continue Lovenox 120 mg SQ daily (CrCl < 30 mL/min) Likely transition to Eliquis tomorrow  Monitor CBC, renal function and s/sx of bleeding   Trixie Rude, PharmD Clinical Pharmacist 04/29/2023  2:01 PM  Please check AMION for all [facility] Pharmacy phone numbers After 10:00 PM, call Main Pharmacy [phone]

## 2023-04-29 NOTE — Progress Notes (Addendum)
Rounding Note    Patient Name: [name] Date of Encounter: 04/29/2023  [facility] Cardiologist: Marjo Bicker, MD   Subjective   Still complains of chest pain. Has been up walking in the hallways and to the bathroom.  Inpatient Medications    Scheduled Meds:  amLODipine  2.5 mg Oral Daily   aspirin EC  81 mg Oral Daily   atorvastatin  80 mg Oral q morning   carvedilol  12.5 mg Oral BID WC   clonazePAM  0.5 mg Oral TID   clopidogrel  75 mg Oral Daily   darbepoetin (ARANESP) injection - NON-DIALYSIS  60 mcg Subcutaneous Q Fri-1800   enoxaparin (LOVENOX) injection  120 mg Subcutaneous Q24H   gabapentin  300 mg Oral QHS   insulin aspart  0-15 Units Subcutaneous TID WC   insulin aspart  14 Units Subcutaneous TID WC   insulin detemir  50 Units Subcutaneous BID   isosorbide mononitrate  60 mg Oral q morning   lamoTRIgine  100 mg Oral BID   multivitamin with minerals  1 tablet Oral Daily   polyethylene glycol  17 g Oral BID   senna-docusate  2 tablet Oral QHS   sertraline  100 mg Oral Daily   sodium chloride flush  3 mL Intravenous Q12H   Continuous Infusions:  sodium chloride Stopped (04/28/23 1915)   PRN Meds: sodium chloride, acetaminophen **OR** acetaminophen, bisacodyl, diphenhydrAMINE, fentaNYL (SUBLIMAZE) injection, nitroGLYCERIN, ondansetron **OR** ondansetron (ZOFRAN) IV, mouth rinse, oxyCODONE, prochlorperazine, sodium chloride flush   Vital Signs    Vitals:   04/28/23 2029 04/29/23 0419 04/29/23 0630 04/29/23 0816  BP: (!) 138/55 132/66  133/67  Pulse:    (!) 58  Resp: 17 18  20   Temp: 98.6 F (37 C)  97.9 F (36.6 C) 98 F (36.7 C)  TempSrc: Oral  Oral Oral  SpO2: 97%   98%  Weight:   112.8 kg   Height:        Intake/Output Summary (Last 24 hours) at 04/29/2023 0938 Last data filed at 04/29/2023 0647 Gross per 24 hour  Intake 936.33 ml  Output 1000 ml  Net -63.67 ml      04/29/2023    6:30 AM 04/28/2023    6:59 AM  04/27/2023    6:38 AM  Last 3 Weights  Weight (lbs) 248 lb 9.6 oz 246 lb 12.8 oz 245 lb  Weight (kg) 112.764 kg 111.948 kg 111.131 kg      Telemetry    Sinus Rhythm-->brady at times - Personally Reviewed  Physical Exam   GEN: No acute distress.  Morbidly obese female Neck: No JVD Cardiac: RRR, no murmurs, rubs, or gallops.  Respiratory: Clear to auscultation bilaterally. GI: Soft, nontender, non-distended  MS: No edema; No deformity. Neuro:  Nonfocal  Psych: Normal affect   Labs    High Sensitivity Troponin:   Recent Labs  Lab 04/22/23 0432 04/24/23 1221 04/24/23 1436 04/27/23 1608 04/27/23 1817  TROPONINIHS 4 4 4 5 5      Chemistry Recent Labs  Lab 04/27/23 0118 04/28/23 0040 04/29/23 0103  NA 136 132* 135  K 3.8 3.8 3.9  CL 97* 94* 98  CO2 26 27 26   GLUCOSE 209* 187* 129*  BUN 33* 36* 34*  CREATININE 2.75* 2.78* 2.76*  CALCIUM 8.3* 8.1* 8.1*  ALBUMIN 2.7* 2.6* 2.8*  GFRNONAA 19* 19* 19*  ANIONGAP 13 11 11     Lipids No results for input(s): "CHOL", "TRIG", "HDL", "LABVLDL", "  LDLCALC", "CHOLHDL" in the last 168 hours.  Hematology Recent Labs  Lab 04/27/23 0118 04/28/23 0040 04/29/23 0103  WBC 5.5 5.4 5.2  RBC 3.15* 3.20* 3.22*  HGB 8.5* 8.7* 8.7*  HCT 27.6* 28.2* 28.3*  MCV 87.6 88.1 87.9  MCH 27.0 27.2 27.0  MCHC 30.8 30.9 30.7  RDW 17.9* 18.1* 18.1*  PLT 173 179 182   Thyroid No results for input(s): "TSH", "FREET4" in the last 168 hours.  BNPNo results for input(s): "BNP", "PROBNP" in the last 168 hours.  DDimer No results for input(s): "DDIMER" in the last 168 hours.   Radiology    No results found.  Cardiac Studies   Echo 04/17/23 IMPRESSIONS    1. Left ventricular ejection fraction, by estimation, is 55 to 60%. The  left ventricle has normal function. The left ventricle has no regional  wall motion abnormalities. Left ventricular diastolic parameters are  consistent with Grade I diastolic  dysfunction (impaired relaxation).   2.  Right ventricular systolic function is normal. The right ventricular  size is normal. Tricuspid regurgitation signal is inadequate for assessing  PA pressure.   3. The mitral valve is abnormal. Mild mitral valve regurgitation. No  evidence of mitral stenosis.   4. The aortic valve is tricuspid. Aortic valve regurgitation is not  visualized. No aortic stenosis is present.   5. Aortic dilatation noted. There is mild dilatation of the ascending  aorta, measuring 36 mm.   6. The inferior vena cava is normal in size with greater than 50%  respiratory variability, suggesting right atrial pressure of 3 mmHg.    Limited Echo 04/22/23 IMPRESSIONS    1. Limited Echo for IVC check.   2. Left ventricular ejection fraction, by estimation, is 60 to 65%. The  left ventricle has normal function. Left ventricular diastolic function  could not be evaluated.   3. Right ventricular systolic function is normal. The right ventricular  size is normal. There is normal pulmonary artery systolic pressure.   4. The inferior vena cava is normal in size with <50% respiratory  variability, suggesting right atrial pressure of 8 mmHg.    RHC 04/25/23   Hemodynamic findings consistent with mild pulmonary hypertension.   Mild elevation of right heart pressures  Patient Profile     62 y.o. female with a history of CKD, CAD with PCI 2022, PAF on eliquis, HLD, DM2 with hyperglycemia, and anemia presented to [facility] with chest pain   Assessment & Plan    Chest pain CAD s/p DES to RCA '2022 (In [place]) -- Complained of sharp left-sided chest pain that radiated to her back with associated dizziness and nausea.  High-sensitivity troponins have been negative with no significant EKG changes.  Left heart catheterization was considered but given her worsening renal function would favor continued medical therapy. -- on Coreg 12.5 mg twice daily, Plavix 75 mg daily, amlodipine 2.5 mg daily, Imdur 60 mg daily.  Will stop aspirin with  the need for oral anticoagulation. -- continues to have chest pain, will further increase amlodipine to 5mg  daily.   HFpEF -- Echo 8/1 with LVEF of 55 to 60%, no regional wall motion abnormality, normal RV.  Repeat limited echo on 8/6 with continued LVEF of 60 to 65%, normal RV, normal pulmonary artery systolic pressure. -- Right heart cath 8/9 with hemodynamics consistent with mild pulmonary hypertension with mild elevation of right heart pressures -- Nephrology following, managing diuretics  Acute on chronic CKD stage IIIb -- Creatinine peaked at  2.78, 2.76 this morning -- Nephrology following with plans to restart Lasix 40 mg twice daily  Hypertension -- Well-controlled -- Continue Coreg 12.5 mg twice daily, Imdur 60 mg daily, amlodipine 2.5 mg daily  Paroxysmal atrial fibrillation -- Currently on Lovenox 120 mg daily, would plan to transition back to Eliquis as clear no plans for invasive workup.  Will review with MD  Hyperlipidemia -- Direct LDL 120, HDL 126, triglycerides 448 -- Continue atorvastatin 80 mg daily, add Zetia  DM -- SSI  For questions or updates, please contact [facility] Please consult [email or web address] for contact info under        Signed, Laverda Page, NP  04/29/2023, 9:38 AM     Patient seen and examined. Agree with assessment and plan. Currently pain free but had sharp chest pain earlier. ECG stable. Cr 2.76 today. Medical therapy. Increase amlodipine to 5 mg. With mixed hyperlipidemia will add lovaza 1 gram bid to atorvastatin and zetia.    Lennette Bihari, MD, [facility] 04/29/2023 11:47 AM

## 2023-04-29 NOTE — Plan of Care (Signed)
Educated on medication changes. See MAR for details. No cardiac cath today, per Dr. Tresa Endo will reassess tomorrow.  Sleepy in the afternoon, held afternoon clonazepam dose, educated on medication hold.    Problem: Education: Goal: Ability to describe self-care measures that may prevent or decrease complications (Diabetes Survival Skills Education) will improve Outcome: Progressing Goal: Individualized Educational Video(s) Outcome: Progressing   Problem: Coping: Goal: Ability to adjust to condition or change in health will improve Outcome: Progressing   Problem: Fluid Volume: Goal: Ability to maintain a balanced intake and output will improve Outcome: Progressing   Problem: Health Behavior/Discharge Planning: Goal: Ability to identify and utilize available resources and services will improve Outcome: Progressing Goal: Ability to manage health-related needs will improve Outcome: Progressing   Problem: Metabolic: Goal: Ability to maintain appropriate glucose levels will improve Outcome: Progressing

## 2023-04-29 NOTE — Progress Notes (Signed)
Pt is a 62 y.o. yo female with longstanding DM and HTN who was admitted on 04/15/2023 with CP   Assessment/Plan: 1. Renal-  baseline crt 1.1 to 1.4-  ( was 1.49 on 03/30/23) , on 7/30 1.9 upon admission and then progressed to 2.5-2.75.  UA bland and US unremarkable-  occasional lower BPs noted during course but not on ACE/ARB.  The plan was in place to get renal artery duplex for further workup.  There are no indications for dialysis so no plans for such-   she is nonoliguric (1L/24h).  orthostatics slightly positive by pulse. Last dose of Lasix 80mg  was on 8/11  Will restart Lasix 40mg  BID. Function may be stabilizing and she is 2.1L net positive during this hospitalization.  2. CV -  right heart cath 8/9 c/w some pulmonary HTN and volume overload-  had been diuresed and tolerated  well -    If cards felt strongly about pursuing left heart cath we would do whatever we needed to do -  not sure that she would require dialysis even then.  Difficult situation as I am not sure she will ever say that she feels good so difficult to interpret her sxms.   3. Anemia-  is not helping things-  last 4/4 dose of IV iron given 8/12 and ESA on 8/9  5. HTN/volume-  coreg 12.5 BID and imdur 60-  given lasix -  now that her crt is bumping-  was orthostatic by pulse   Subjective:  says she feels a little better. UOP 1L /24hrs.  weight same as at admission and I&O +2.1L   Cardiology may be planning for left heart cath ?      Objective Vital signs in last 24 hours: Vitals:   04/28/23 2029 04/29/23 0419 04/29/23 0630 04/29/23 0816  BP: (!) 138/55 132/66  133/67  Pulse:    (!) 58  Resp: 17 18  20   Temp: 98.6 F (37 C)  97.9 F (36.6 C) 98 F (36.7 C)  TempSrc: Oral  Oral Oral  SpO2: 97%   98%  Weight:   112.8 kg   Height:       Weight change: 0.816 kg  Intake/Output Summary (Last 24 hours) at 04/29/2023 0938 Last data filed at 04/29/2023 6644 Gross per 24 hour  Intake 936.33 ml  Output 1000 ml  Net  -63.67 ml      ,  W    Labs: Basic Metabolic Panel: Recent Labs  Lab 04/27/23 0118 04/28/23 0040 04/29/23 0103  NA 136 132* 135  K 3.8 3.8 3.9  CL 97* 94* 98  CO2 26 27 26   GLUCOSE 209* 187* 129*  BUN 33* 36* 34*  CREATININE 2.75* 2.78* 2.76*  CALCIUM 8.3* 8.1* 8.1*  PHOS 5.0* 4.5 3.8   Liver Function Tests: Recent Labs  Lab 04/27/23 0118 04/28/23 0040 04/29/23 0103  ALBUMIN 2.7* 2.6* 2.8*   No results for input(s): "LIPASE", "AMYLASE" in the last 168 hours. No results for input(s): "AMMONIA" in the last 168 hours. CBC: Recent Labs  Lab 04/25/23 0712 04/25/23 1402 04/26/23 0238 04/27/23 0118 04/28/23 0040 04/29/23 0103  WBC 4.4  --  4.6 5.5 5.4 5.2  HGB 9.0*   < > 8.6* 8.5* 8.7* 8.7*  HCT 28.8*   < > 27.0* 27.6* 28.2* 28.3*  MCV 86.7  --  86.5 87.6 88.1 87.9  PLT 151  --  161 173 179 182   < > = values in this interval  not displayed.   Cardiac Enzymes: Recent Labs  Lab 04/23/23 0910  CKTOTAL 68   CBG: Recent Labs  Lab 04/28/23 1130 04/28/23 1541 04/28/23 2058 04/29/23 0421 04/29/23 0814  GLUCAP 171* 210* 175* 122* 112*    Iron Studies: No results for input(s): "IRON", "TIBC", "TRANSFERRIN", "FERRITIN" in the last 72 hours. Studies/Results: No results found. Medications: Infusions:  sodium chloride Stopped (04/28/23 1915)    Scheduled Medications:  amLODipine  2.5 mg Oral Daily   aspirin EC  81 mg Oral Daily   atorvastatin  80 mg Oral q morning   carvedilol  12.5 mg Oral BID WC   clonazePAM  0.5 mg Oral TID   clopidogrel  75 mg Oral Daily   darbepoetin (ARANESP) injection - NON-DIALYSIS  60 mcg Subcutaneous Q Fri-1800   enoxaparin (LOVENOX) injection  120 mg Subcutaneous Q24H   gabapentin  300 mg Oral QHS   insulin aspart  0-15 Units Subcutaneous TID WC   insulin aspart  14 Units Subcutaneous TID WC   insulin detemir  50 Units Subcutaneous BID   isosorbide mononitrate  60 mg Oral q morning   lamoTRIgine  100 mg Oral BID    multivitamin with minerals  1 tablet Oral Daily   polyethylene glycol  17 g Oral BID   senna-docusate  2 tablet Oral QHS   sertraline  100 mg Oral Daily   sodium chloride flush  3 mL Intravenous Q12H    have reviewed scheduled and prn medications.  Physical Exam: General: obese, no distress but says she feels terrible Heart: RRR Lungs: mostly clear Abdomen: obese, soft non tender Extremities:no obvious peripheral edema     04/29/2023,9:38 AM  LOS: 12 days

## 2023-04-29 NOTE — Plan of Care (Signed)
  Problem: Education: Goal: Ability to describe self-care measures that may prevent or decrease complications (Diabetes Survival Skills Education) will improve Outcome: Progressing   Problem: Nutritional: Goal: Maintenance of adequate nutrition will improve Outcome: Progressing   Problem: Skin Integrity: Goal: Risk for impaired skin integrity will decrease Outcome: Progressing   Problem: Activity: Goal: Risk for activity intolerance will decrease Outcome: Progressing   Problem: Nutrition: Goal: Adequate nutrition will be maintained Outcome: Progressing   Problem: Elimination: Goal: Will not experience complications related to bowel motility Outcome: Progressing Goal: Will not experience complications related to urinary retention Outcome: Progressing   Problem: Safety: Goal: Ability to remain free from injury will improve Outcome: Progressing

## 2023-04-29 NOTE — Progress Notes (Signed)
PROGRESS NOTE  [name] MVH:[id number] DOB: 11/11/1960 DOA: 04/15/2023 PCP: Patient, No Pcp Per  Brief History:  62 y.o. female with medical history significant of CAD s/p PCI, hypertension, hyperlipidemia, T2DM, DVT on Eliquis who presents to the emergency department with complaints of chest pain.  Patient complained of left-sided chest pain which occurred yesterday while sitting in a car chest pain was described as sharp and nonreproducible and was rated as 8/10 on pain scale with radiation to the back, this was associated with dizziness and nausea without vomiting.  She states that she called her PCP who advised her to go to the ED for further evaluation and management.  Patient denies shortness of breath, diaphoresis, fever.  The patient's troponins have been unremarkable.  Echocardiogram on 04/22/2023 showed EF 60 to 65%, trivial MR, normal RV EF.  However, patient continued to have intermittent chest pain throughout her hospitalization.  Her hospitalization was prolonged due to development of acute on chronic renal failure with serum creatinine up to 2.56.  Nephrology was consulted.  The patient was started on IV heparin for her persistent chest pain.  She received IV heparin for about a week and it was ultimately discontinued on 04/24/2023.  After discussion with nephrology, it was felt that the patient likely may have new renal baseline.  After discussion with the patient of the risk, benefits, and alternatives, all parties involved agreed to pursue left heart catheterization.   04/25/2023: Patient underwent cardiac catheterization today.  Cardiology input is appreciated.  Postcatheterization, patient reports seeing spots, with associated left-sided eye pain.  On further questioning, patient reports prior episodes of similar symptoms.  CT stroke protocol has been activated.  Patient is currently on aspirin and Plavix.    04/28/2023: Patient continues to report intermittent chest pain.   Input from cardiology and nephrology team is highly appreciated.  Systolic blood pressure has ranged from 86 to 118 mmHg over the last 24 hours.  Urine output of approximately 1000 cc.  Serum creatinine is 2.78 today (stable).  Lasix is on hold.  Decision to proceed with cardiac catheterization deferred to cardiology and nephrology team.  04/29/2023: Patient seen.  No new complaints.  Serum creatinine is stable.  Nephrology input is appreciated.  Cardiology team is still managing patient supportively.  Discharge patient home once cleared for discharge by the cardiology team and nephrology team.  Assessment/Plan: Chest pain with high risk for cardiac etiology/unstable angina - appreciate cardiology team consultation - continue supportive measures, IV heparin infusion and high dose statin  - working to improve creatinine in case cath is approved - cardio team planning to premedicate for cath given history of contrast allergy -has some atypical features--requiring fentanyl IV pushes for pain -Patient has received 8 days of intravenous heparin.  This was discontinued on 04/24/2023. -04/22/2023 echo EF 60 to 65%, normal RV EF, trivial TR. -After discussion with nephrology and the patient, it was decided to go forward with left heart catheterization. 04/25/2023: Patient underwent cardiac catheterization today.  Awaiting full report. 04/26/2023: Patient continues to report chest pain.  Only right heart catheterization was done yesterday.  Cardiology is following patient.  Cardiology input is appreciated. 04/29/2023: Cardiology is directing care.  See above documentation.  AKI on CKD stage 3b  -Baseline creatinine 1.2-1.4 -Serum creatinine today is 2.78.   - renal US done 8/3 with normal findings reported  - urinalysis reassuring -Nephrology is directing care.   - CT  stone study--negative hydronephrosis, bibasilar GGO--infx vs edema; trace bilateral pleural effusion, moderate stool burden 04/29/2023: Serum  creatinine is stable.  pulmonary infiltrates -viral respiratory panel--negative -check COVID negative -check PCT < 0.10   Anemia in CKD  - Hg down to 7.7 with hydration - with her CAD, cardiology team want her Hg>8 - transfused 1 unit PRBC on 8/5 - Hg up to 9 after transfusion - follow   - hemoccult stools     RLQ abdominal pain - intermittent, lipase test came back normal - pt reports appendectomy and cholecystectomy - CT abd/pelvis without contrast ordered (due to contrast allergy and upcoming cath) - 8/2 CT AP with findings of hepatomegaly and steatosis of liver, no acute findings to explain abd pain - 8/6 CT renal protocol--negative hydronephrosis, bibasilar GGO--infx vs edema; trace bilateral pleural effusion, moderate stool burden  CAD -continue ASA/plavix, statin -s/p RCA PCI in [place] 2022 04/27/2023: Cardiology team is directing care.  Possible cardiac catheterization in the morning.   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 - increased prandial novolog to 14 units TID with meals - continue home detemir 50 units BID  - 04/15/23 A1C--9.1    Chronic atrial fibrillation - carvedilol for heart rate control - holding apixaban in anticipation for possible cath later this week  - IV heparin for anticoagulation>>stopped 8/8   Hyperlipidemia - atorvastatin 80 mg daily    Essential Hypertension  - resumed home carvedilol with good BP control    Morbid Obesity -BMI 45.26 -lifestyle modification       Family Communication:   no Family at bedside   Consultants:  renal, cards   Code Status:  FULL   DVT Prophylaxis:  IV Heparin     Procedures: As Listed in Progress Note Above   Antibiotics: None   Subjective: Patient continues to report chest pain..  Objective: Vitals:    04/29/23 0630 04/29/23 0816 04/29/23 1419 04/29/23 1600  BP:  133/67 (!) 140/59 (!) 137/54  Pulse:  (!) 58  (!) 57  Resp:  20 16 18   Temp: 97.9 F (36.6 C) 98 F (36.7 C) 97.7 F (36.5 C) 97.6 F (36.4 C)  TempSrc: Oral Oral Oral Oral  SpO2:  98%  95%  Weight: 112.8 kg     Height:        Intake/Output Summary (Last 24 hours) at 04/29/2023 1927 Last data filed at 04/29/2023 1121 Gross per 24 hour  Intake 939.33 ml  Output 1000 ml  Net -60.67 ml   Weight change: 0.816 kg Exam:  General:  Pt is morbidly obese.  Not in any distress.  Awake and alert.   HEENT: Patient is pale.  No jaundice.   Cardiovascular: S1-S2. Respiratory: Clear to auscultation. Abdomen: Obese, soft and nontender. Neuro: Awake and alert.  Moves all extremities.    Extremities: Edema is improving.  Data Reviewed: I have personally reviewed following labs and imaging studies Basic Metabolic Panel: Recent Labs  Lab 04/25/23 0712 04/25/23 1402 04/25/23 1404 04/26/23 0238 04/27/23 0118 04/28/23 0040 04/29/23 0103  NA 139   < > 138 133* 136 132* 135  K 4.2   < > 4.3 3.6 3.8 3.8 3.9  CL 101  --   --  96* 97* 94* 98  CO2 25  --   --  26 26 27 26   GLUCOSE 113*  --   --  126* 209* 187* 129*  BUN 22*  --   --  27* 33* 36* 34*  CREATININE 2.52*  --   --  2.62* 2.75* 2.78* 2.76*  CALCIUM 8.9  --   --  8.7* 8.3* 8.1* 8.1*  PHOS 5.1*  --   --  5.3* 5.0* 4.5 3.8   < > = values in this interval not displayed.   Liver Function Tests: Recent Labs  Lab 04/25/23 0712 04/26/23 0238 04/27/23 0118 04/28/23 0040 04/29/23 0103  ALBUMIN 2.7* 2.8* 2.7* 2.6* 2.8*   No results for input(s): "LIPASE", "AMYLASE" in the last 168 hours.  No results for input(s): "AMMONIA" in the last 168 hours. Coagulation Profile: No results for input(s): "INR", "PROTIME" in the last 168 hours. CBC: Recent Labs  Lab 04/25/23 0712 04/25/23 1402 04/25/23 1404 04/26/23 0238 04/27/23 0118 04/28/23 0040 04/29/23 0103  WBC 4.4   --   --  4.6 5.5 5.4 5.2  HGB 9.0*   < > 8.8* 8.6* 8.5* 8.7* 8.7*  HCT 28.8*   < > 26.0* 27.0* 27.6* 28.2* 28.3*  MCV 86.7  --   --  86.5 87.6 88.1 87.9  PLT 151  --   --  161 173 179 182   < > = values in this interval not displayed.   Cardiac Enzymes: Recent Labs  Lab 04/23/23 0910  CKTOTAL 68   BNP: Invalid input(s): "POCBNP" CBG: Recent Labs  Lab 04/28/23 2058 04/29/23 0421 04/29/23 0814 04/29/23 1145 04/29/23 1558  GLUCAP 175* 122* 112* 161* 227*   HbA1C: No results for input(s): "HGBA1C" in the last 72 hours. Urine analysis:    Component Value Date/Time   COLORURINE YELLOW 04/21/2023 0726   APPEARANCEUR CLOUDY (A) 04/21/2023 0726   LABSPEC 1.006 04/21/2023 0726   PHURINE 5.0 04/21/2023 0726   GLUCOSEU NEGATIVE 04/21/2023 0726   HGBUR NEGATIVE 04/21/2023 0726   BILIRUBINUR NEGATIVE 04/21/2023 0726   KETONESUR NEGATIVE 04/21/2023 0726   PROTEINUR NEGATIVE 04/21/2023 0726   UROBILINOGEN 0.2 12/17/2011 2258   NITRITE NEGATIVE 04/21/2023 0726   LEUKOCYTESUR NEGATIVE 04/21/2023 0726   Sepsis Labs: @LABRCNTIP (procalcitonin:4,lacticidven:4) ) Recent Results (from the past 240 hour(s))  Respiratory (~20 pathogens) panel by PCR     Status: None   Collection Time: 04/23/23  3:13 AM   Specimen: Nasopharyngeal Swab; Respiratory  Result Value Ref Range Status   Adenovirus NOT DETECTED NOT DETECTED Final   Coronavirus 229E NOT DETECTED NOT DETECTED Final    Comment: (NOTE) The Coronavirus on the Respiratory Panel, DOES NOT test for the novel  Coronavirus (2019 nCoV)    Coronavirus HKU1 NOT DETECTED NOT DETECTED Final   Coronavirus NL63 NOT DETECTED NOT DETECTED Final   Coronavirus OC43 NOT DETECTED NOT DETECTED Final   Metapneumovirus NOT DETECTED NOT DETECTED Final   Rhinovirus / Enterovirus NOT DETECTED NOT DETECTED Final   Influenza A NOT DETECTED NOT DETECTED Final   Influenza B NOT DETECTED NOT DETECTED Final   Parainfluenza Virus 1 NOT DETECTED NOT  DETECTED Final   Parainfluenza Virus 2 NOT DETECTED NOT DETECTED Final   Parainfluenza Virus 3 NOT DETECTED NOT DETECTED Final   Parainfluenza Virus 4 NOT DETECTED NOT DETECTED Final   Respiratory Syncytial Virus NOT DETECTED NOT DETECTED Final   Bordetella pertussis NOT DETECTED NOT DETECTED Final   Bordetella Parapertussis NOT DETECTED NOT DETECTED Final   Chlamydophila pneumoniae NOT DETECTED NOT DETECTED Final  Mycoplasma pneumoniae NOT DETECTED NOT DETECTED Final    Comment: Performed at [facility], [address], [place], [place] [address]  SARS Coronavirus 2 by RT PCR (hospital order, performed in [facility] hospital lab) *cepheid single result test* Anterior Nasal Swab     Status: None   Collection Time: 04/23/23  5:31 PM   Specimen: Anterior Nasal Swab  Result Value Ref Range Status   SARS Coronavirus 2 by RT PCR NEGATIVE NEGATIVE Final    Comment: (NOTE) SARS-CoV-2 target nucleic acids are NOT DETECTED.  The SARS-CoV-2 RNA is generally detectable in upper and lower respiratory specimens during the acute phase of infection. The lowest concentration of SARS-CoV-2 viral copies this assay can detect is 250 copies / mL. A negative result does not preclude SARS-CoV-2 infection and should not be used as the sole basis for treatment or other patient management decisions.  A negative result may occur with improper specimen collection / handling, submission of specimen other than nasopharyngeal swab, presence of viral mutation(s) within the areas targeted by this assay, and inadequate number of viral copies (<250 copies / mL). A negative result must be combined with clinical observations, patient history, and epidemiological information.  Fact Sheet for Patients:   RoadLapTop.co.za  Fact Sheet for Healthcare Providers: http://kim-miller.com/  This test is not yet approved or  cleared by the [place] FDA and has been  authorized for detection and/or diagnosis of SARS-CoV-2 by FDA under an Emergency Use Authorization (EUA).  This EUA will remain in effect (meaning this test can be used) for the duration of the COVID-19 declaration under Section 564(b)(1) of the Act, 21 U.S.C. section 360bbb-3(b)(1), unless the authorization is terminated or revoked sooner.  Performed at [facility], [address] [place], [place] [address]      Scheduled Meds:  [START ON 04/30/2023] amLODipine  5 mg Oral Daily   atorvastatin  80 mg Oral q morning   carvedilol  12.5 mg Oral BID WC   clonazePAM  0.5 mg Oral TID   clopidogrel  75 mg Oral Daily   darbepoetin (ARANESP) injection - NON-DIALYSIS  60 mcg Subcutaneous Q Fri-1800   enoxaparin (LOVENOX) injection  120 mg Subcutaneous Q24H   ezetimibe  10 mg Oral Daily   furosemide  40 mg Intravenous BID   gabapentin  300 mg Oral QHS   insulin aspart  0-15 Units Subcutaneous TID WC   insulin aspart  14 Units Subcutaneous TID WC   insulin detemir  50 Units Subcutaneous BID   isosorbide mononitrate  60 mg Oral q morning   lamoTRIgine  100 mg Oral BID   multivitamin with minerals  1 tablet Oral Daily   polyethylene glycol  17 g Oral BID   senna-docusate  2 tablet Oral QHS   sertraline  100 mg Oral Daily   sodium chloride flush  3 mL Intravenous Q12H   Continuous Infusions:  sodium chloride Stopped (04/28/23 1915)    Procedures/Studies: CT HEAD CODE STROKE WO CONTRAST  Result Date: 04/25/2023 CLINICAL DATA:  Code stroke.  TIA.  Acute visual changes. EXAM: CT HEAD WITHOUT CONTRAST TECHNIQUE: Contiguous axial images were obtained from the base of the skull through the vertex without intravenous contrast. RADIATION DOSE REDUCTION: This exam was performed according to the departmental dose-optimization program which includes automated exposure control, adjustment of the mA and/or kV according to patient size and/or use of iterative reconstruction technique. COMPARISON:  CT head  without contrast 02/08/2023 FINDINGS: Brain: No  acute infarct, hemorrhage, or mass lesion is present. The ventricles are of normal size. No significant extraaxial fluid collection is present. The brainstem and cerebellum are within normal limits. Midline structures are within normal limits. Vascular: Atherosclerotic calcifications are present within the cavernous internal carotid arteries. No hyperdense vessel is present. Skull: Calvarium is intact. No focal lytic or blastic lesions are present. No significant extracranial soft tissue lesion is present. Sinuses/Orbits: Polyps are again noted in the left maxillary sinus. Increased mucosal thickening is present. The globes and orbits are within normal limits. IMPRESSION: 1. Normal CT appearance of the brain. 2. Increased mucosal thickening in the left maxillary sinus. The above was relayed via text pager to Dr. Viviann Spare on 04/25/2023 at 18:56 . Electronically Signed   By: Marin Roberts M.D.   On: 04/25/2023 18:56   CARDIAC CATHETERIZATION  Result Date: 04/25/2023   Hemodynamic findings consistent with mild pulmonary hypertension. Mild elevation of right heart pressures   CT RENAL STONE STUDY  Result Date: 04/22/2023 CLINICAL DATA:  Left flank pain, nausea EXAM: CT ABDOMEN AND PELVIS WITHOUT CONTRAST TECHNIQUE: Multidetector CT imaging of the abdomen and pelvis was performed following the standard protocol without IV contrast. RADIATION DOSE REDUCTION: This exam was performed according to the departmental dose-optimization program which includes automated exposure control, adjustment of the mA and/or kV according to patient size and/or use of iterative reconstruction technique. COMPARISON:  04/18/2023 FINDINGS: Lower chest: Interval development of bibasilar peribronchovascular ground-glass pulmonary infiltrates likely related to multifocal infection or noncardiogenic pulmonary edema. Trace bilateral pleural effusions. Extensive multi-vessel coronary artery  calcification. Global cardiac size within normal limits. Hepatobiliary: No focal liver abnormality is seen. Status post cholecystectomy. No biliary dilatation. Pancreas: Unremarkable. No pancreatic ductal dilatation or surrounding inflammatory changes. Spleen: Mild splenomegaly is stable with the spleen measuring 15.6 cm in greatest dimension. This appears slightly progressive since remote prior examination 10/07/2022. No intrasplenic lesions identified on this noncontrast examination. Adrenals/Urinary Tract: The adrenal glands are unremarkable. The kidneys are normal in size and position. 1-2 mm punctate nonobstructing calculi are noted within the interpolar and lower polar regions of the kidneys bilaterally. No hydronephrosis. Minimal nonspecific bilateral perinephric stranding. No perinephric fluid collections. No hydronephrosis. No ureteral calculi. The bladder is unremarkable. Stomach/Bowel: Moderate colonic stool burden without evidence of obstruction. Status post subtotal appendectomy. The stomach, small bowel, and large bowel are otherwise unremarkable. No evidence of obstruction or focal inflammation. No free intraperitoneal gas or fluid. Vascular/Lymphatic: Aortic atherosclerosis. No enlarged abdominal or pelvic lymph nodes. Reproductive: Status post hysterectomy. No adnexal masses. Other: No abdominal wall hernia. Musculoskeletal: Osseous structures are age-appropriate. No acute bone abnormality. No lytic or blastic bone lesion. IMPRESSION: 1. Interval development of bibasilar peribronchovascular ground-glass pulmonary infiltrates likely related to multifocal infection or noncardiogenic pulmonary edema. Trace bilateral pleural effusions. 2. Extensive multi-vessel coronary artery calcification. 3. Mild splenomegaly, progressive since remote prior examination of 10/07/2022. 4. Minimal bilateral nonobstructing nephrolithiasis. No urolithiasis. No hydronephrosis. 5. Moderate colonic stool burden without  evidence of obstruction. 6. Aortic atherosclerosis. Aortic Atherosclerosis (ICD10-I70.0). Electronically Signed   By: Helyn Numbers M.D.   On: 04/22/2023 19:45   ECHOCARDIOGRAM LIMITED  Result Date: 04/22/2023    ECHOCARDIOGRAM LIMITED REPORT   Patient Name:   [name] Date of Exam: 04/22/2023 Medical Rec #:  [id number]      Height:       63.0 in Accession #:    [id number]     Weight:       245.0  lb Date of Birth:  08/25/1961      BSA:          2.108 m Patient Age:    61 years       BP:           117/62 mmHg Patient Gender: F              HR:           67 bpm. [facility] Procedure: Limited Echo, Cardiac Doppler and Color Doppler Indications:    CHF                 IVC check  History:        Patient has prior history of Echocardiogram examinations, most                 recent 04/17/2023. CHF, CAD and Angina, Arrythmias:Atrial                 Fibrillation, Signs/Symptoms:Chest Pain; Risk                 Factors:Hypertension, Diabetes and Dyslipidemia. CKD.  Sonographer:    Mikki Harbor Referring Phys: [id number] VISHNU P MALLIPEDDI  Sonographer Comments: Patient is obese. IMPRESSIONS  1. Limited Echo for IVC check.  2. Left ventricular ejection fraction, by estimation, is 60 to 65%. The left ventricle has normal function. Left ventricular diastolic function could not be evaluated.  3. Right ventricular systolic function is normal. The right ventricular size is normal. There is normal pulmonary artery systolic pressure.  4. The inferior vena cava is normal in size with <50% respiratory variability, suggesting right atrial pressure of 8 mmHg. FINDINGS  Left Ventricle: Left ventricular ejection fraction, by estimation, is 60 to 65%. The left ventricle has normal function. The left ventricular internal cavity size was normal in size. There is no left ventricular hypertrophy. Left ventricular diastolic function could not be evaluated. Right Ventricle: The right ventricular size is normal. Right  ventricular systolic function is normal. There is normal pulmonary artery systolic pressure. The tricuspid regurgitant velocity is 2.52 m/s, and with an assumed right atrial pressure of 8 mmHg,  the estimated right ventricular systolic pressure is 33.4 mmHg. Left Atrium: Left atrial size was not assessed. Right Atrium: Right atrial size was not assessed. Pericardium: There is no evidence of pericardial effusion. Mitral Valve: The mitral valve is normal in structure. Tricuspid Valve: The tricuspid valve is normal in structure. Tricuspid valve regurgitation is trivial. Aortic Valve: The aortic valve is tricuspid. There is mild calcification of the aortic valve. There is mild thickening of the aortic valve. There is mild aortic valve annular calcification. Pulmonic Valve: The pulmonic valve was normal in structure. Aorta: The aortic root is normal in size and structure. Venous: The inferior vena cava is normal in size with less than 50% respiratory variability, suggesting right atrial pressure of 8 mmHg. IAS/Shunts: The interatrial septum was not assessed. LEFT VENTRICLE PLAX 2D LVIDd:         4.70 cm LVIDs:         3.00 cm LV PW:         1.20 cm LV IVS:        1.10 cm LVOT diam:     2.10 cm LVOT Area:     3.46 cm  LEFT ATRIUM         Index LA diam:    4.40 cm 2.09 cm/m   AORTA Ao Root diam:  3.10 cm TRICUSPID VALVE TR Peak grad:   25.4 mmHg TR Vmax:        252.00 cm/s  SHUNTS Systemic Diam: 2.10 cm Vishnu Priya Mallipeddi Electronically signed by Winfield Rast Mallipeddi Signature Date/Time: 04/22/2023/5:07:38 PM    Final    DG CHEST PORT 1 VIEW  Result Date: 04/21/2023 CLINICAL DATA:  Chest pain EXAM: PORTABLE CHEST 1 VIEW COMPARISON:  04/20/2023 FINDINGS: The heart size and mediastinal contours are within normal limits. Both lungs are clear. The visualized skeletal structures are unremarkable. IMPRESSION: No active disease. Electronically Signed   By: Charlett Nose M.D.   On: 04/21/2023 16:58   DG CHEST PORT 1  VIEW  Result Date: 04/20/2023 CLINICAL DATA:  [id number] Chest pain [id number] EXAM: PORTABLE CHEST - 1 VIEW COMPARISON:  04/15/2023 FINDINGS: Relatively low lung volumes with some crowding of bronchovascular structures centrally. No new infiltrate. No pneumothorax. Heart size and mediastinal contours are within normal limits. No effusion. Visualized bones unremarkable. IMPRESSION: Low lung volumes. No acute findings. Electronically Signed   By: Corlis Leak M.D.   On: 04/20/2023 10:03   US RENAL  Result Date: 04/19/2023 CLINICAL DATA:  Acute on chronic kidney injury EXAM: RENAL / URINARY TRACT ULTRASOUND COMPLETE COMPARISON:  CT abdomen pelvis 04/18/2023 FINDINGS: Right Kidney: Renal measurements: 9.4 x 4.9 x 4.4 cm = volume: 105 mL. Echogenicity within normal limits. No mass or hydronephrosis visualized. Left Kidney: Renal measurements: 11.6 x 5.6 x 5.0 cm = volume: 169 mL. Echogenicity within normal limits. No mass or hydronephrosis visualized. Bladder: Limited evaluation due to to collapsed configuration. Other: Diffuse increased echogenicity of the visualized portions of the hepatic parenchyma are a nonspecific indicator of hepatocellular dysfunction, most commonly steatosis. IMPRESSION: No significant sonographic abnormality of the kidneys. Electronically Signed   By: Acquanetta Belling M.D.   On: 04/19/2023 10:55   CT ABDOMEN PELVIS WO CONTRAST  Result Date: 04/18/2023 CLINICAL DATA:  Right lower quadrant abdominal pain EXAM: CT ABDOMEN AND PELVIS WITHOUT CONTRAST TECHNIQUE: Multidetector CT imaging of the abdomen and pelvis was performed following the standard protocol without IV contrast. RADIATION DOSE REDUCTION: This exam was performed according to the departmental dose-optimization program which includes automated exposure control, adjustment of the mA and/or kV according to patient size and/or use of iterative reconstruction technique. COMPARISON:  None Available. FINDINGS: Lower chest: No acute findings.  Coronary artery and aortic calcifications. Hepatobiliary: Prior cholecystectomy. Diffuse fatty infiltration of the liver. No focal hepatic abnormality. Mild hepatomegaly with craniocaudal length 23 cm. Pancreas: No focal abnormality or ductal dilatation. Spleen: Splenomegaly with a craniocaudal length of 14.5 cm. This is stable since prior study. No focal abnormality. Adrenals/Urinary Tract: Punctate bilateral nonobstructing renal calculi. No ureteral stones or hydronephrosis. Adrenal glands and urinary bladder unremarkable. Stomach/Bowel: Stomach, large and small bowel grossly unremarkable. Vascular/Lymphatic: Aortic atherosclerosis. No evidence of aneurysm or adenopathy. Reproductive: Prior hysterectomy.  No adnexal masses. Other: No free fluid or free air. Musculoskeletal: No acute bony abnormality. IMPRESSION: Hepatic steatosis.  Hepatosplenomegaly. No acute findings in the abdomen or pelvis. Aortic atherosclerosis, coronary artery disease. Punctate bilateral nephrolithiasis.  No hydronephrosis. Electronically Signed   By: Charlett Nose M.D.   On: 04/18/2023 19:22   ECHOCARDIOGRAM COMPLETE  Result Date: 04/17/2023    ECHOCARDIOGRAM REPORT   Patient Name:   [name] Date of Exam: 04/17/2023 Medical Rec #:  [id number]      Height:       63.0 in Accession #:    [id number]  Weight:       245.0 lb Date of Birth:  1961-06-25      BSA:          2.108 m Patient Age:    61 years       BP:           136/73 mmHg Patient Gender: F              HR:           71 bpm. [facility] Procedure: 2D Echo, Cardiac Doppler and Color Doppler Indications:    Chest Pain R07.9  History:        Patient has no prior history of Echocardiogram examinations.                 CAD, Arrythmias:Atrial Fibrillation, Signs/Symptoms:Chest Pain;                 Risk Factors:Dyslipidemia, Hypertension and Diabetes.  Sonographer:    Aron Baba Referring Phys: [id number] Ellsworth Lennox  Sonographer Comments: Patient is obese.  IMPRESSIONS  1. Left ventricular ejection fraction, by estimation, is 55 to 60%. The left ventricle has normal function. The left ventricle has no regional wall motion abnormalities. Left ventricular diastolic parameters are consistent with Grade I diastolic dysfunction (impaired relaxation).  2. Right ventricular systolic function is normal. The right ventricular size is normal. Tricuspid regurgitation signal is inadequate for assessing PA pressure.  3. The mitral valve is abnormal. Mild mitral valve regurgitation. No evidence of mitral stenosis.  4. The aortic valve is tricuspid. Aortic valve regurgitation is not visualized. No aortic stenosis is present.  5. Aortic dilatation noted. 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  6. The inferior vena cava is normal in size with greater than 50% respiratory variability, suggesting right atrial pressure of 3 mmHg. FINDINGS  Left Ventricle: Left ventricular ejection fraction, by estimation, is 55 to 60%. The left ventricle has normal function. The left ventricle has no regional wall motion abnormalities. The left ventricular internal cavity size was normal in size. There is  no left ventricular hypertrophy. Left ventricular diastolic parameters are consistent with Grade I diastolic dysfunction (impaired relaxation). Normal left ventricular filling pressure. Right Ventricle: The right ventricular size is normal. Right vetricular wall thickness was not well visualized. Right ventricular systolic function is normal. Tricuspid regurgitation signal is inadequate for assessing PA pressure. Left Atrium: Left atrial size was normal in size. Right Atrium: Right atrial size was normal in size. Pericardium: There is no evidence of pericardial effusion. Mitral Valve: The mitral valve is abnormal. Mild mitral valve regurgitation. No evidence of mitral valve stenosis. Tricuspid Valve: The tricuspid valve is normal in structure. Tricuspid valve regurgitation is  trivial. No evidence of tricuspid stenosis. Aortic Valve: The aortic valve is tricuspid. Aortic valve regurgitation is not visualized. No aortic stenosis is present. Aortic valve mean gradient measures 5.4 mmHg. Aortic valve peak gradient measures 8.7 mmHg. Aortic valve area, by VTI measures 1.85 cm. Pulmonic Valve: The pulmonic valve was not well visualized. Pulmonic valve regurgitation is not visualized. No evidence of pulmonic stenosis. Aorta: The aortic root is normal in size and structure and aortic dilatation noted. 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 Venous: The inferior vena cava is normal in size with greater than 50% respiratory variability, suggesting right atrial pressure of 3 mmHg. IAS/Shunts: No atrial level shunt detected by color flow Doppler.  LEFT VENTRICLE  PLAX 2D LVIDd:         5.00 cm   Diastology LVIDs:         3.60 cm   LV e' medial:    13.20 cm/s LV PW:         0.90 cm   LV E/e' medial:  5.8 LV IVS:        0.80 cm   LV e' lateral:   6.22 cm/s LVOT diam:     2.00 cm   LV E/e' lateral: 12.3 LV SV:         67 LV SV Index:   32 LVOT Area:     3.14 cm  RIGHT VENTRICLE RV S prime:     12.60 cm/s TAPSE (M-mode): 2.2 cm LEFT ATRIUM             Index        RIGHT ATRIUM           Index LA diam:        4.00 cm 1.90 cm/m   RA Area:     10.10 cm LA Vol (A2C):   69.3 ml 32.88 ml/m  RA Volume:   20.40 ml  9.68 ml/m LA Vol (A4C):   65.9 ml 31.26 ml/m LA Biplane Vol: 70.8 ml 33.59 ml/m  AORTIC VALVE AV Area (Vmax):    1.94 cm AV Area (Vmean):   1.69 cm AV Area (VTI):     1.85 cm AV Vmax:           147.81 cm/s AV Vmean:          109.081 cm/s AV VTI:            0.360 m AV Peak Grad:      8.7 mmHg AV Mean Grad:      5.4 mmHg LVOT Vmax:         91.34 cm/s LVOT Vmean:        58.622 cm/s LVOT VTI:          0.212 m LVOT/AV VTI ratio: 0.59  AORTA Ao Root diam: 3.40 cm Ao Asc diam:  3.60 cm MITRAL VALVE MV Area (PHT): 4.71 cm     SHUNTS MV Decel Time: 161 msec     Systemic  VTI:  0.21 m MR Peak grad: 82.2 mmHg     Systemic Diam: 2.00 cm MR Vmax:      453.33 cm/s MV E velocity: 76.60 cm/s MV A velocity: 108.00 cm/s MV E/A ratio:  0.71 Dina Rich MD Electronically signed by Dina Rich MD Signature Date/Time: 04/17/2023/1:33:26 PM    Final    CT CHEST ABDOMEN PELVIS WO CONTRAST  Result Date: 04/16/2023 CLINICAL DATA:  Sharp chest pain radiating to back. Left-sided rib pain and right-sided abdominal pain. EXAM: CT CHEST, ABDOMEN AND PELVIS WITHOUT CONTRAST TECHNIQUE: Multidetector CT imaging of the chest, abdomen and pelvis was performed following the standard protocol without IV contrast. RADIATION DOSE REDUCTION: This exam was performed according to the departmental dose-optimization program which includes automated exposure control, adjustment of the mA and/or kV according to patient size and/or use of iterative reconstruction technique. COMPARISON:  10/07/2022. FINDINGS: CT CHEST FINDINGS Cardiovascular: The heart is normal in size and there is no pericardial effusion. Three-vessel coronary artery calcifications are noted. There is atherosclerotic calcification of the aorta without evidence of aneurysm. The pulmonary trunk is normal in caliber. Mediastinum/Nodes: No mediastinal or hilar lymphadenopathy. Evaluation of the hila is limited due to lack of IV contrast. Coarse  calcification is noted in the left lobe of the thyroid gland. The trachea and esophagus are within normal limits. Lungs/Pleura: Mild atelectasis is present bilaterally. No effusion or pneumothorax. Small calcified granuloma are present bilaterally. Musculoskeletal: Degenerative changes are present in the thoracic spine. No acute fracture is seen. CT ABDOMEN PELVIS FINDINGS Hepatobiliary: No focal liver abnormality is seen. The liver is mildly enlarged. No gallstones, gallbladder wall thickening, or biliary dilatation. Pancreas: Unremarkable. No pancreatic ductal dilatation or surrounding inflammatory  changes. Spleen: The spleen is enlarged at 14.1 cm in length. No focal abnormality. Adrenals/Urinary Tract: The adrenal glands are within normal limits. A nonobstructive renal calculus is noted on the right. No hydroureteronephrosis bilaterally. The bladder is unremarkable. Stomach/Bowel: Stomach is within normal limits. Appendix is surgically absent. No evidence of bowel wall thickening, distention, or inflammatory changes. No free air or pneumatosis. Vascular/Lymphatic: Aortic atherosclerosis. No enlarged abdominal or pelvic lymph nodes. Reproductive: Status post hysterectomy. No adnexal masses. Other: No abdominopelvic ascites. Subcutaneous fat stranding is noted in the anterior abdominal wall in the left lower quadrant, unchanged from the prior exam. Musculoskeletal: Degenerative changes are present in the lumbar spine. No acute fracture. IMPRESSION: 1. No acute process in the chest, abdomen, or pelvis. 2. Nonobstructive right renal calculus. 3. Mild hepatosplenomegaly. 4. Subcutaneous fat stranding in the anterior abdominal wall in the left lower quadrant, unchanged from the prior exam, possible cellulitis. 5. Coronary artery calcifications. 6. Aortic atherosclerosis. Electronically Signed   By: Thornell Sartorius M.D.   On: 04/16/2023 00:19   DG Chest 2 View  Result Date: 04/15/2023 CLINICAL DATA:  Left-sided chest pain for 2 days EXAM: CHEST - 2 VIEW COMPARISON:  03/30/2023 FINDINGS: Frontal and lateral views of the chest demonstrate an unremarkable cardiac silhouette. No airspace disease, effusion, or pneumothorax. No acute bony abnormality. IMPRESSION: 1. No acute intrathoracic process. Electronically Signed   By: Sharlet Salina M.D.   On: 04/15/2023 22:33    Barnetta Chapel, MD  [facility]  If 7PM-7AM, please contact night-coverage [email or web address] Password [facility] 04/29/2023, 7:27 PM   LOS: 12 days

## 2023-04-29 NOTE — Progress Notes (Signed)
Physical Therapy Treatment Patient Details Name: [name] MRN: [id number] DOB: June 30, 1961 Today's Date: 04/29/2023   History of Present Illness 62 y.o. female with medical history significant of CAD s/p PCI, hypertension, hyperlipidemia, T2DM, DVT on Eliquis who presents to the emergency department with complaints of chest pain.  Patient complained of left-sided chest pain which occurred yesterday while sitting in a car chest pain was described as sharp and nonreproducible and was rated as 8/10 on pain scale with radiation to the back, this was associated with dizziness and nausea without vomiting.  She states that she called her PCP who advised her to go to the ED for further evaluation and management.  Patient denies shortness of breath, diaphoresis, fever.    PT Comments  Pt admitted with above diagnosis. Pt declined walking as she states she just woke up.  PT educated pt regarding exercise program and contacted CM regarding need for rollator for home.  Pt currently with functional limitations due to the deficits listed below (see PT Problem List). Pt will benefit from acute skilled PT to increase their independence and safety with mobility to allow discharge.       If plan is discharge home, recommend the following: A little help with walking and/or transfers;Help with stairs or ramp for entrance;Assistance with cooking/housework   Can travel by Pension scheme manager (4 wheels)    Recommendations for Other Services       Precautions / Restrictions Precautions Precautions: Fall Precaution Comments: patient reports 3 falls in the last six months Restrictions Weight Bearing Restrictions: No     Mobility  Bed Mobility Overal bed mobility: Modified Independent             General bed mobility comments: Pt presented upright in chair    Transfers Overall transfer level: Modified independent Equipment used: None Transfers: Sit to/from  Stand Sit to Stand: Supervision, Contact guard assist           General transfer comment: Declined getting OOB saying it was too early. Pt did agree to exercises.    Ambulation/Gait                   Stairs             Wheelchair Mobility     Tilt Bed    Modified Rankin (Stroke Patients Only)       Balance                                            Cognition Arousal: Alert Behavior During Therapy: WFL for tasks assessed/performed Overall Cognitive Status: Within Functional Limits for tasks assessed                                          Exercises General Exercises - Upper Extremity Shoulder Flexion: AROM, Both, 10 reps, Seated General Exercises - Lower Extremity Ankle Circles/Pumps: AROM, Seated, Strengthening, Right, Left, 10 reps Quad Sets: AROM, Both, 10 reps, Supine Gluteal Sets: AROM, Both, 10 reps, Supine Short Arc Quad: AROM, Both, 10 reps, Supine Heel Slides: AROM, Both, 15 reps, 10 reps, Supine Hip ABduction/ADduction: AROM, Both, 10 reps, Supine Other Exercises Other Exercises: gave pt handout for exercises  General Comments        Pertinent Vitals/Pain Pain Assessment Pain Assessment: No/denies pain    Home Living                          Prior Function            PT Goals (current goals can now be found in the care plan section) Acute Rehab PT Goals Patient Stated Goal: Go home and feel better. Progress towards PT goals: Progressing toward goals    Frequency    Min 1X/week      PT Plan      Co-evaluation              AM-PAC PT "6 Clicks" Mobility   Outcome Measure  Help needed turning from your back to your side while in a flat bed without using bedrails?: None Help needed moving from lying on your back to sitting on the side of a flat bed without using bedrails?: None Help needed moving to and from a bed to a chair (including a wheelchair)?: A  Little Help needed standing up from a chair using your arms (e.g., wheelchair or bedside chair)?: A Little Help needed to walk in hospital room?: A Little Help needed climbing 3-5 steps with a railing? : A Little 6 Click Score: 20    End of Session   Activity Tolerance: Patient limited by fatigue Patient left: with call bell/phone within reach;in bed;with bed alarm set Nurse Communication: Mobility status PT Visit Diagnosis: Unsteadiness on feet (R26.81);History of falling (Z91.81);Other abnormalities of gait and mobility (R26.89)     Time: 4540-9811 PT Time Calculation (min) (ACUTE ONLY): 9 min  Charges:    $Therapeutic Exercise: 8-22 mins PT General Charges $$ ACUTE PT VISIT: 1 Visit                      M,PT Acute Rehab Services [phone]    Bevelyn Buckles 04/29/2023, 12:19 PM

## 2023-04-30 ENCOUNTER — Other Ambulatory Visit (HOSPITAL_COMMUNITY): Payer: Self-pay

## 2023-04-30 DIAGNOSIS — I2510 Atherosclerotic heart disease of native coronary artery without angina pectoris: Secondary | ICD-10-CM

## 2023-04-30 DIAGNOSIS — R0789 Other chest pain: Secondary | ICD-10-CM | POA: Diagnosis not present

## 2023-04-30 DIAGNOSIS — I10 Essential (primary) hypertension: Secondary | ICD-10-CM | POA: Diagnosis not present

## 2023-04-30 DIAGNOSIS — N179 Acute kidney failure, unspecified: Secondary | ICD-10-CM | POA: Diagnosis not present

## 2023-04-30 DIAGNOSIS — Z9861 Coronary angioplasty status: Secondary | ICD-10-CM

## 2023-04-30 LAB — GLUCOSE, CAPILLARY
Glucose-Capillary: 241 mg/dL — ABNORMAL HIGH (ref 70–99)
Glucose-Capillary: 120 mg/dL — ABNORMAL HIGH (ref 70–99)
Glucose-Capillary: 244 mg/dL — ABNORMAL HIGH (ref 70–99)
Glucose-Capillary: 290 mg/dL — ABNORMAL HIGH (ref 70–99)

## 2023-04-30 MED ORDER — OMEGA-3-ACID ETHYL ESTERS 1 G PO CAPS
1.0000 g | ORAL_CAPSULE | Freq: Two times a day (BID) | ORAL | Status: DC
  Administered 2023-04-30 – 2023-05-03 (×7): 1 g via ORAL
  Filled 2023-04-30 (×7): qty 1

## 2023-04-30 MED ORDER — ISOSORBIDE MONONITRATE ER 60 MG PO TB24
90.0000 mg | ORAL_TABLET | Freq: Every morning | ORAL | Status: DC
  Administered 2023-05-01 – 2023-05-02 (×2): 90 mg via ORAL
  Filled 2023-04-30 (×2): qty 1

## 2023-04-30 NOTE — Evaluation (Signed)
Occupational Therapy Evaluation Patient Details Name: [name] MRN: [id number] DOB: [birth year] Today's Date: 04/30/2023   History of Present Illness 62 y.o. female with medical history significant of CAD s/p PCI, hypertension, hyperlipidemia, T2DM, DVT on Eliquis who presents to the emergency department with complaints of chest pain.  Patient complained of left-sided chest pain which occurred yesterday while sitting in a car chest pain was described as sharp and nonreproducible and was rated as 8/10 on pain scale with radiation to the back, this was associated with dizziness and nausea without vomiting.  She states that she called her PCP who advised her to go to the ED for further evaluation and management.  Patient denies shortness of breath, diaphoresis, fever.   Clinical Impression   Pt s/p above diagnosis. Pt doing well, in good spirits, was performing bathing/toileting independently upon arrival, no physical assistance needed. Pt lives at home with daughter who can assist as needed, mostly independent at home but sometimes needs assistance when fatigued. Pt currently displays decreased activity tolerance, but is able to complete ADLs indepednently, supervision for safety for mobility due to hx of falling, able to ambulate around room without AD, no LOB, able to transport items needed for bathing/dressing. Pt would benefit from rollator and shower chair to increase independence and to improve energy conservation during the day to maximize independence and reduce fall risk. Pt has no further acute OT needs, no OT follow up necessary.        If plan is discharge home, recommend the following: A little help with walking and/or transfers;A little help with bathing/dressing/bathroom;Assistance with cooking/housework;Assist for transportation;Help with stairs or ramp for entrance    Functional Status Assessment  Patient has had a recent decline in their functional status and demonstrates the  ability to make significant improvements in function in a reasonable and predictable amount of time.  Equipment Recommendations  Tub/shower seat    Recommendations for Other Services       Precautions / Restrictions Precautions Precautions: Fall Precaution Comments: patient reports 3 falls in the last six months Restrictions Weight Bearing Restrictions: No      Mobility Bed Mobility Overal bed mobility: Modified Independent                  Transfers Overall transfer level: Modified independent                        Balance Overall balance assessment: History of Falls, Modified Independent Sitting-balance support: No upper extremity supported, Feet supported Sitting balance-Leahy Scale: Normal Sitting balance - Comments: EOB ADLs   Standing balance support: No upper extremity supported Standing balance-Leahy Scale: Good Standing balance comment: able to ambulate around room without AD, no LOB, able to stand as needed performing grooming/bathing                           ADL either performed or assessed with clinical judgement   ADL Overall ADL's : Needs assistance/impaired                                       General ADL Comments: Pt supervision for ambulation/mobility, able to complete ADLs independently.     Vision         Perception         Praxis  Pertinent Vitals/Pain Pain Assessment Pain Assessment: 0-10 Pain Score: 7  Faces Pain Scale: Hurts little more Pain Location: chest and bilateral lower legs Pain Descriptors / Indicators: Aching Pain Intervention(s): Monitored during session     Extremity/Trunk Assessment Upper Extremity Assessment Upper Extremity Assessment: Overall WFL for tasks assessed   Lower Extremity Assessment Lower Extremity Assessment: Defer to PT evaluation       Communication Communication Communication: No apparent difficulties   Cognition Arousal:  Alert Behavior During Therapy: WFL for tasks assessed/performed Overall Cognitive Status: Within Functional Limits for tasks assessed                                       General Comments       Exercises     Shoulder Instructions      Home Living Family/patient expects to be discharged to:: Private residence Living Arrangements: Children Available Help at Discharge: Family;Available 24 hours/day Type of Home: House Home Access: Stairs to enter [employer] of Steps: 2 Entrance Stairs-Rails: None Home Layout: One level     Bathroom Shower/Tub: Chief Strategy Officer: Standard Bathroom Accessibility: No   Home Equipment: Grab bars - tub/shower   Additional Comments: Pt states she lives with daughter who assists when needed      Prior Functioning/Environment Prior Level of Function : Needs assist;History of Falls (last six months);Driving             Mobility Comments: limited to household distances; uses scooter at stores ADLs Comments: able to perform ADLs, sometimes needs set up due to fatigue        OT Problem List: Decreased activity tolerance;Pain;Impaired balance (sitting and/or standing)      OT Treatment/Interventions:      OT Goals(Current goals can be found in the care plan section) Acute Rehab OT Goals Patient Stated Goal: to return home, improve strength/endurance, be more independent at home OT Goal Formulation: With patient Time For Goal Achievement: 05/14/23 Potential to Achieve Goals: Good  OT Frequency:      Co-evaluation              AM-PAC OT "6 Clicks" Daily Activity     Outcome Measure Help from another person eating meals?: None Help from another person taking care of personal grooming?: None Help from another person toileting, which includes using toliet, bedpan, or urinal?: A Little Help from another person bathing (including washing, rinsing, drying)?: A Little Help from another  person to put on and taking off regular upper body clothing?: A Little Help from another person to put on and taking off regular lower body clothing?: A Little 6 Click Score: 20   End of Session Nurse Communication: Mobility status;Other (comment) (to replace ECG leads)  Activity Tolerance: Patient tolerated treatment well Patient left: in bed;with call bell/phone within reach  OT Visit Diagnosis: History of falling (Z91.81);Pain;Muscle weakness (generalized) (M62.81) Pain - part of body:  (chest)                Time: 1610-9604 OT Time Calculation (min): 27 min Charges:  OT General Charges $OT Visit: 1 Visit OT Evaluation $OT Eval Low Complexity: 1 Low OT Treatments $Self Care/Home Management : 8-22 mins  [address], OTR/L   Alexis Goodell 04/30/2023, 5:30 PM

## 2023-04-30 NOTE — Progress Notes (Signed)
Mobility Specialist Progress Note:    04/30/23 1208  Mobility  Activity Ambulated with assistance in hallway  Level of Assistance Contact guard assist, steadying assist  Assistive Device Front wheel walker  Distance Ambulated (ft) 200 ft  Activity Response Tolerated well  Mobility Referral Yes  $Mobility charge 1 Mobility  Mobility Specialist Start Time (ACUTE ONLY) 1158  Mobility Specialist Stop Time (ACUTE ONLY) 1210  Mobility Specialist Time Calculation (min) (ACUTE ONLY) 12 min   Pt received in bed, agreeable to ambulate. Required no physical assistance throughout session.c/o mild bilat leg pain, otherwise no c/o. Assisted back to bed after ambulation w/ call bell and personal belongings in reach. All needs met.  Thompson Grayer Mobility Specialist  Please contact vis Secure Chat or  Rehab Office [phone]

## 2023-04-30 NOTE — Progress Notes (Signed)
Pt & pt's daughter Aline Brochure) requesting the cardiology provider contact Christi at [phone] on speaker phone during 04/30/23 rounds to discuss possible plan for pt's chest pain.

## 2023-04-30 NOTE — Progress Notes (Signed)
PROGRESS NOTE  [name] WJX:[id number] DOB: January 09, 1961 DOA: 04/15/2023 PCP: Patient, No Pcp Per  Brief History:  62 y.o. female with medical history significant of CAD s/p PCI, hypertension, hyperlipidemia, T2DM, DVT on Eliquis who presents to the emergency department with complaints of chest pain.  Patient complained of left-sided chest pain which occurred yesterday while sitting in a car chest pain was described as sharp and nonreproducible and was rated as 8/10 on pain scale with radiation to the back, this was associated with dizziness and nausea without vomiting.  She states that she called her PCP who advised her to go to the ED for further evaluation and management.  Patient denies shortness of breath, diaphoresis, fever.  The patient's troponins have been unremarkable.  Echocardiogram on 04/22/2023 showed EF 60 to 65%, trivial MR, normal RV EF.  However, patient continued to have intermittent chest pain throughout her hospitalization.  Her hospitalization was prolonged due to development of acute on chronic renal failure with serum creatinine up to 2.56.  Nephrology was consulted.  The patient was started on IV heparin for her persistent chest pain.  She received IV heparin for about a week and it was ultimately discontinued on 04/24/2023.  After discussion with nephrology, it was felt that the patient likely may have new renal baseline.  After discussion with the patient of the risk, benefits, and alternatives, all parties involved agreed to pursue left heart catheterization.   04/25/2023: Patient underwent cardiac catheterization today.  Cardiology input is appreciated.  Postcatheterization, patient reports seeing spots, with associated left-sided eye pain.  On further questioning, patient reports prior episodes of similar symptoms.  CT stroke protocol has been activated.  Patient is currently on aspirin and Plavix.    04/28/2023: Patient continues to report intermittent chest pain.   Input from cardiology and nephrology team is highly appreciated.  Systolic blood pressure has ranged from 86 to 118 mmHg over the last 24 hours.  Urine output of approximately 1000 cc.  Serum creatinine is 2.78 today (stable).  Lasix is on hold.  Decision to proceed with cardiac catheterization deferred to cardiology and nephrology team.  04/29/2023: Patient seen.  No new complaints.  Serum creatinine is stable.  Nephrology input is appreciated.  Cardiology team is still managing patient supportively.  Discharge patient home once cleared for discharge by the cardiology team and nephrology team.  04/30/2023: Patient seen.  Renal function is slowly improving.  Patient continues to reports pain around the chest area.  Patient is actually depressed.  Input from nephrology team and cardiology team is highly appreciated.  Patient is already on Zoloft.  Likely discharge back home tomorrow if okay with the nephrology and cardiology teams.  Assessment/Plan: Chest pain with high risk for cardiac etiology/unstable angina - appreciate cardiology team consultation - continue supportive measures, IV heparin infusion and high dose statin  - working to improve creatinine in case cath is approved - cardio team planning to premedicate for cath given history of contrast allergy -has some atypical features--requiring fentanyl IV pushes for pain -Patient has received 8 days of intravenous heparin.  This was discontinued on 04/24/2023. -04/22/2023 echo EF 60 to 65%, normal RV EF, trivial TR. -After discussion with nephrology and the patient, it was decided to go forward with left heart catheterization. 04/25/2023: Patient underwent cardiac catheterization today.  Awaiting full report. 04/26/2023: Patient continues to report chest pain.  Only right heart catheterization was done yesterday.  Cardiology is following patient.  Cardiology input is appreciated. 04/29/2023: Cardiology is directing care.  See above  documentation. 04/30/2023: Patient is depressed.  Cardiology input is appreciated.  Patient is on Zoloft.  AKI on CKD stage 3b  -Baseline creatinine 1.2-1.4 -Serum creatinine today is 2.78.   - renal US done 8/3 with normal findings reported  - urinalysis reassuring -Nephrology is directing care.   - CT stone study--negative hydronephrosis, bibasilar GGO--infx vs edema; trace bilateral pleural effusion, moderate stool burden 04/29/2023: Serum creatinine is stable. 04/20/2023: Serum creatinine continues to improve.  Likely discharge home tomorrow if okay with cardiology and nephrology team.  pulmonary infiltrates -viral respiratory panel--negative -check COVID negative -check PCT < 0.10   Anemia in CKD  - Hg down to 7.7 with hydration - with her CAD, cardiology team want her Hg>8 - transfused 1 unit PRBC on 8/5 - Hg up to 9 after transfusion - follow   - hemoccult stools     RLQ abdominal pain - intermittent, lipase test came back normal - pt reports appendectomy and cholecystectomy - CT abd/pelvis without contrast ordered (due to contrast allergy and upcoming cath) - 8/2 CT AP with findings of hepatomegaly and steatosis of liver, no acute findings to explain abd pain - 8/6 CT renal protocol--negative hydronephrosis, bibasilar GGO--infx vs edema; trace bilateral pleural effusion, moderate stool burden  CAD -continue ASA/plavix, statin -s/p RCA PCI in [place] 2022 04/27/2023: Cardiology team is directing care.  Possible cardiac catheterization in the morning.   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 - increased prandial novolog to 14 units TID with meals - continue home detemir 50 units BID  - 04/15/23 A1C--9.1    Chronic atrial fibrillation - carvedilol for heart rate control - holding apixaban  in anticipation for possible cath later this week  - IV heparin for anticoagulation>>stopped 8/8   Hyperlipidemia - atorvastatin 80 mg daily    Essential Hypertension  - resumed home carvedilol with good BP control    Morbid Obesity -BMI 45.26 -lifestyle modification       Family Communication:   no Family at bedside   Consultants:  renal, cards   Code Status:  FULL   DVT Prophylaxis:  IV Heparin     Procedures: As Listed in Progress Note Above   Antibiotics: None   Subjective: Patient continues to report chest pain..  Objective: Vitals:   04/29/23 1959 04/30/23 0601 04/30/23 0743 04/30/23 1609  BP: (!) 154/62 (!) 142/54 121/60 (!) 117/54  Pulse: 65 62 (!) 59 61  Resp: 16 20 19 18   Temp: 98.3 F (36.8 C) 98.5 F (36.9 C) 98.5 F (36.9 C) 98 F (36.7 C)  TempSrc: Oral Oral Oral Oral  SpO2: 96% 99%    Weight:  111.1 kg    Height:        Intake/Output Summary (Last 24 hours) at 04/30/2023 1925 Last data filed at 04/29/2023 2324 Gross per 24 hour  Intake 3 ml  Output --  Net 3 ml   Weight change: -1.633 kg Exam:  General:  Pt is morbidly obese.  Not in any distress.  Awake and alert.   HEENT: Patient is pale.  No jaundice.   Cardiovascular: S1-S2. Respiratory: Clear to auscultation. Abdomen: Obese, soft and nontender. Neuro: Awake and alert.  Moves all  extremities.    Extremities: Edema is improving.  Data Reviewed: I have personally reviewed following labs and imaging studies Basic Metabolic Panel: Recent Labs  Lab 04/26/23 0238 04/27/23 0118 04/28/23 0040 04/29/23 0103 04/30/23 0037  NA 133* 136 132* 135 133*  K 3.6 3.8 3.8 3.9 4.0  CL 96* 97* 94* 98 94*  CO2 26 26 27 26 27   GLUCOSE 126* 209* 187* 129* 256*  BUN 27* 33* 36* 34* 37*  CREATININE 2.62* 2.75* 2.78* 2.76* 2.49*  CALCIUM 8.7* 8.3* 8.1* 8.1* 8.6*  PHOS 5.3* 5.0* 4.5 3.8 4.0   Liver Function Tests: Recent Labs  Lab 04/26/23 0238 04/27/23 0118 04/28/23 0040  04/29/23 0103 04/30/23 0037  ALBUMIN 2.8* 2.7* 2.6* 2.8* 3.1*   No results for input(s): "LIPASE", "AMYLASE" in the last 168 hours.  No results for input(s): "AMMONIA" in the last 168 hours. Coagulation Profile: No results for input(s): "INR", "PROTIME" in the last 168 hours. CBC: Recent Labs  Lab 04/25/23 0712 04/25/23 1402 04/25/23 1404 04/26/23 0238 04/27/23 0118 04/28/23 0040 04/29/23 0103  WBC 4.4  --   --  4.6 5.5 5.4 5.2  HGB 9.0*   < > 8.8* 8.6* 8.5* 8.7* 8.7*  HCT 28.8*   < > 26.0* 27.0* 27.6* 28.2* 28.3*  MCV 86.7  --   --  86.5 87.6 88.1 87.9  PLT 151  --   --  161 173 179 182   < > = values in this interval not displayed.   Cardiac Enzymes: No results for input(s): "CKTOTAL", "CKMB", "CKMBINDEX", "TROPONINI" in the last 168 hours.  BNP: Invalid input(s): "POCBNP" CBG: Recent Labs  Lab 04/29/23 1558 04/29/23 2103 04/30/23 0745 04/30/23 1130 04/30/23 1612  GLUCAP 227* 145* 241* 120* 244*   HbA1C: No results for input(s): "HGBA1C" in the last 72 hours. Urine analysis:    Component Value Date/Time   COLORURINE YELLOW 04/21/2023 0726   APPEARANCEUR CLOUDY (A) 04/21/2023 0726   LABSPEC 1.006 04/21/2023 0726   PHURINE 5.0 04/21/2023 0726   GLUCOSEU NEGATIVE 04/21/2023 0726   HGBUR NEGATIVE 04/21/2023 0726   BILIRUBINUR NEGATIVE 04/21/2023 0726   KETONESUR NEGATIVE 04/21/2023 0726   PROTEINUR NEGATIVE 04/21/2023 0726   UROBILINOGEN 0.2 12/17/2011 2258   NITRITE NEGATIVE 04/21/2023 0726   LEUKOCYTESUR NEGATIVE 04/21/2023 0726   Sepsis Labs: @LABRCNTIP (procalcitonin:4,lacticidven:4) ) Recent Results (from the past 240 hour(s))  Respiratory (~20 pathogens) panel by PCR     Status: None   Collection Time: 04/23/23  3:13 AM   Specimen: Nasopharyngeal Swab; Respiratory  Result Value Ref Range Status   Adenovirus NOT DETECTED NOT DETECTED Final   Coronavirus 229E NOT DETECTED NOT DETECTED Final    Comment: (NOTE) The Coronavirus on the Respiratory  Panel, DOES NOT test for the novel  Coronavirus (2019 nCoV)    Coronavirus HKU1 NOT DETECTED NOT DETECTED Final   Coronavirus NL63 NOT DETECTED NOT DETECTED Final   Coronavirus OC43 NOT DETECTED NOT DETECTED Final   Metapneumovirus NOT DETECTED NOT DETECTED Final   Rhinovirus / Enterovirus NOT DETECTED NOT DETECTED Final   Influenza A NOT DETECTED NOT DETECTED Final   Influenza B NOT DETECTED NOT DETECTED Final   Parainfluenza Virus 1 NOT DETECTED NOT DETECTED Final   Parainfluenza Virus 2 NOT DETECTED NOT DETECTED Final   Parainfluenza Virus 3 NOT DETECTED NOT DETECTED Final   Parainfluenza Virus 4 NOT DETECTED NOT DETECTED Final   Respiratory Syncytial Virus NOT DETECTED NOT DETECTED Final   Bordetella pertussis NOT DETECTED  NOT DETECTED Final   Bordetella Parapertussis NOT DETECTED NOT DETECTED Final   Chlamydophila pneumoniae NOT DETECTED NOT DETECTED Final   Mycoplasma pneumoniae NOT DETECTED NOT DETECTED Final    Comment: Performed at [facility], [address], [place], [place] [address]  SARS Coronavirus 2 by RT PCR (hospital order, performed in [facility] hospital lab) *cepheid single result test* Anterior Nasal Swab     Status: None   Collection Time: 04/23/23  5:31 PM   Specimen: Anterior Nasal Swab  Result Value Ref Range Status   SARS Coronavirus 2 by RT PCR NEGATIVE NEGATIVE Final    Comment: (NOTE) SARS-CoV-2 target nucleic acids are NOT DETECTED.  The SARS-CoV-2 RNA is generally detectable in upper and lower respiratory specimens during the acute phase of infection. The lowest concentration of SARS-CoV-2 viral copies this assay can detect is 250 copies / mL. A negative result does not preclude SARS-CoV-2 infection and should not be used as the sole basis for treatment or other patient management decisions.  A negative result may occur with improper specimen collection / handling, submission of specimen other than nasopharyngeal swab, presence of viral  mutation(s) within the areas targeted by this assay, and inadequate number of viral copies (<250 copies / mL). A negative result must be combined with clinical observations, patient history, and epidemiological information.  Fact Sheet for Patients:   RoadLapTop.co.za  Fact Sheet for Healthcare Providers: http://kim-miller.com/  This test is not yet approved or  cleared by the [place] FDA and has been authorized for detection and/or diagnosis of SARS-CoV-2 by FDA under an Emergency Use Authorization (EUA).  This EUA will remain in effect (meaning this test can be used) for the duration of the COVID-19 declaration under Section 564(b)(1) of the Act, 21 U.S.C. section 360bbb-3(b)(1), unless the authorization is terminated or revoked sooner.  Performed at [facility], [address], [place], [place] [address]      Scheduled Meds:  amLODipine  5 mg Oral Daily   atorvastatin  80 mg Oral q morning   carvedilol  12.5 mg Oral BID WC   clonazePAM  0.5 mg Oral TID   clopidogrel  75 mg Oral Daily   darbepoetin (ARANESP) injection - NON-DIALYSIS  60 mcg Subcutaneous Q Fri-1800   enoxaparin (LOVENOX) injection  120 mg Subcutaneous Q24H   ezetimibe  10 mg Oral Daily   furosemide  40 mg Intravenous BID   gabapentin  300 mg Oral QHS   insulin aspart  0-15 Units Subcutaneous TID WC   insulin aspart  14 Units Subcutaneous TID WC   insulin detemir  50 Units Subcutaneous BID   [START ON 05/01/2023] isosorbide mononitrate  90 mg Oral q morning   lamoTRIgine  100 mg Oral BID   multivitamin with minerals  1 tablet Oral Daily   omega-3 acid ethyl esters  1 g Oral BID   polyethylene glycol  17 g Oral BID   senna-docusate  2 tablet Oral QHS   sertraline  100 mg Oral Daily   sodium chloride flush  3 mL Intravenous Q12H   Continuous Infusions:  sodium chloride Stopped (04/28/23 1915)    Procedures/Studies: CT HEAD CODE STROKE WO  CONTRAST  Result Date: 04/25/2023 CLINICAL DATA:  Code stroke.  TIA.  Acute visual changes. EXAM: CT HEAD WITHOUT CONTRAST TECHNIQUE: Contiguous axial images were obtained from the base of the skull through the vertex without intravenous contrast. RADIATION DOSE REDUCTION: This exam was performed according to the  departmental dose-optimization program which includes automated exposure control, adjustment of the mA and/or kV according to patient size and/or use of iterative reconstruction technique. COMPARISON:  CT head without contrast 02/08/2023 FINDINGS: Brain: No acute infarct, hemorrhage, or mass lesion is present. The ventricles are of normal size. No significant extraaxial fluid collection is present. The brainstem and cerebellum are within normal limits. Midline structures are within normal limits. Vascular: Atherosclerotic calcifications are present within the cavernous internal carotid arteries. No hyperdense vessel is present. Skull: Calvarium is intact. No focal lytic or blastic lesions are present. No significant extracranial soft tissue lesion is present. Sinuses/Orbits: Polyps are again noted in the left maxillary sinus. Increased mucosal thickening is present. The globes and orbits are within normal limits. IMPRESSION: 1. Normal CT appearance of the brain. 2. Increased mucosal thickening in the left maxillary sinus. The above was relayed via text pager to Dr. Viviann Spare on 04/25/2023 at 18:56 . Electronically Signed   By: Marin Roberts M.D.   On: 04/25/2023 18:56   CARDIAC CATHETERIZATION  Result Date: 04/25/2023   Hemodynamic findings consistent with mild pulmonary hypertension. Mild elevation of right heart pressures   CT RENAL STONE STUDY  Result Date: 04/22/2023 CLINICAL DATA:  Left flank pain, nausea EXAM: CT ABDOMEN AND PELVIS WITHOUT CONTRAST TECHNIQUE: Multidetector CT imaging of the abdomen and pelvis was performed following the standard protocol without IV contrast. RADIATION DOSE  REDUCTION: This exam was performed according to the departmental dose-optimization program which includes automated exposure control, adjustment of the mA and/or kV according to patient size and/or use of iterative reconstruction technique. COMPARISON:  04/18/2023 FINDINGS: Lower chest: Interval development of bibasilar peribronchovascular ground-glass pulmonary infiltrates likely related to multifocal infection or noncardiogenic pulmonary edema. Trace bilateral pleural effusions. Extensive multi-vessel coronary artery calcification. Global cardiac size within normal limits. Hepatobiliary: No focal liver abnormality is seen. Status post cholecystectomy. No biliary dilatation. Pancreas: Unremarkable. No pancreatic ductal dilatation or surrounding inflammatory changes. Spleen: Mild splenomegaly is stable with the spleen measuring 15.6 cm in greatest dimension. This appears slightly progressive since remote prior examination 10/07/2022. No intrasplenic lesions identified on this noncontrast examination. Adrenals/Urinary Tract: The adrenal glands are unremarkable. The kidneys are normal in size and position. 1-2 mm punctate nonobstructing calculi are noted within the interpolar and lower polar regions of the kidneys bilaterally. No hydronephrosis. Minimal nonspecific bilateral perinephric stranding. No perinephric fluid collections. No hydronephrosis. No ureteral calculi. The bladder is unremarkable. Stomach/Bowel: Moderate colonic stool burden without evidence of obstruction. Status post subtotal appendectomy. The stomach, small bowel, and large bowel are otherwise unremarkable. No evidence of obstruction or focal inflammation. No free intraperitoneal gas or fluid. Vascular/Lymphatic: Aortic atherosclerosis. No enlarged abdominal or pelvic lymph nodes. Reproductive: Status post hysterectomy. No adnexal masses. Other: No abdominal wall hernia. Musculoskeletal: Osseous structures are age-appropriate. No acute bone  abnormality. No lytic or blastic bone lesion. IMPRESSION: 1. Interval development of bibasilar peribronchovascular ground-glass pulmonary infiltrates likely related to multifocal infection or noncardiogenic pulmonary edema. Trace bilateral pleural effusions. 2. Extensive multi-vessel coronary artery calcification. 3. Mild splenomegaly, progressive since remote prior examination of 10/07/2022. 4. Minimal bilateral nonobstructing nephrolithiasis. No urolithiasis. No hydronephrosis. 5. Moderate colonic stool burden without evidence of obstruction. 6. Aortic atherosclerosis. Aortic Atherosclerosis (ICD10-I70.0). Electronically Signed   By: Helyn Numbers M.D.   On: 04/22/2023 19:45   ECHOCARDIOGRAM LIMITED  Result Date: 04/22/2023    ECHOCARDIOGRAM LIMITED REPORT   Patient Name:   LATOSHA MIEDEMA Matlin Date of Exam: 04/22/2023 Medical Rec #:  [id number]      Height:       63.0 in Accession #:    [id number]     Weight:       245.0 lb Date of Birth:  06-11-1961      BSA:          2.108 m Patient Age:    61 years       BP:           117/62 mmHg Patient Gender: F              HR:           67 bpm. [facility] Procedure: Limited Echo, Cardiac Doppler and Color Doppler Indications:    CHF                 IVC check  History:        Patient has prior history of Echocardiogram examinations, most                 recent 04/17/2023. CHF, CAD and Angina, Arrythmias:Atrial                 Fibrillation, Signs/Symptoms:Chest Pain; Risk                 Factors:Hypertension, Diabetes and Dyslipidemia. CKD.  Sonographer:    Mikki Harbor Referring Phys: [id number] VISHNU P MALLIPEDDI  Sonographer Comments: Patient is obese. IMPRESSIONS  1. Limited Echo for IVC check.  2. Left ventricular ejection fraction, by estimation, is 60 to 65%. The left ventricle has normal function. Left ventricular diastolic function could not be evaluated.  3. Right ventricular systolic function is normal. The right ventricular size is normal. There is  normal pulmonary artery systolic pressure.  4. The inferior vena cava is normal in size with <50% respiratory variability, suggesting right atrial pressure of 8 mmHg. FINDINGS  Left Ventricle: Left ventricular ejection fraction, by estimation, is 60 to 65%. The left ventricle has normal function. The left ventricular internal cavity size was normal in size. There is no left ventricular hypertrophy. Left ventricular diastolic function could not be evaluated. Right Ventricle: The right ventricular size is normal. Right ventricular systolic function is normal. There is normal pulmonary artery systolic pressure. The tricuspid regurgitant velocity is 2.52 m/s, and with an assumed right atrial pressure of 8 mmHg,  the estimated right ventricular systolic pressure is 33.4 mmHg. Left Atrium: Left atrial size was not assessed. Right Atrium: Right atrial size was not assessed. Pericardium: There is no evidence of pericardial effusion. Mitral Valve: The mitral valve is normal in structure. Tricuspid Valve: The tricuspid valve is normal in structure. Tricuspid valve regurgitation is trivial. Aortic Valve: The aortic valve is tricuspid. There is mild calcification of the aortic valve. There is mild thickening of the aortic valve. There is mild aortic valve annular calcification. Pulmonic Valve: The pulmonic valve was normal in structure. Aorta: The aortic root is normal in size and structure. Venous: The inferior vena cava is normal in size with less than 50% respiratory variability, suggesting right atrial pressure of 8 mmHg. IAS/Shunts: The interatrial septum was not assessed. LEFT VENTRICLE PLAX 2D LVIDd:         4.70 cm LVIDs:         3.00 cm LV PW:         1.20 cm LV IVS:        1.10 cm LVOT diam:     2.10 cm LVOT Area:  3.46 cm  LEFT ATRIUM         Index LA diam:    4.40 cm 2.09 cm/m   AORTA Ao Root diam: 3.10 cm TRICUSPID VALVE TR Peak grad:   25.4 mmHg TR Vmax:        252.00 cm/s  SHUNTS Systemic Diam: 2.10 cm  Vishnu Priya Mallipeddi Electronically signed by Winfield Rast Mallipeddi Signature Date/Time: 04/22/2023/5:07:38 PM    Final    DG CHEST PORT 1 VIEW  Result Date: 04/21/2023 CLINICAL DATA:  Chest pain EXAM: PORTABLE CHEST 1 VIEW COMPARISON:  04/20/2023 FINDINGS: The heart size and mediastinal contours are within normal limits. Both lungs are clear. The visualized skeletal structures are unremarkable. IMPRESSION: No active disease. Electronically Signed   By: Charlett Nose M.D.   On: 04/21/2023 16:58   DG CHEST PORT 1 VIEW  Result Date: 04/20/2023 CLINICAL DATA:  [id number] Chest pain [id number] EXAM: PORTABLE CHEST - 1 VIEW COMPARISON:  04/15/2023 FINDINGS: Relatively low lung volumes with some crowding of bronchovascular structures centrally. No new infiltrate. No pneumothorax. Heart size and mediastinal contours are within normal limits. No effusion. Visualized bones unremarkable. IMPRESSION: Low lung volumes. No acute findings. Electronically Signed   By: Corlis Leak M.D.   On: 04/20/2023 10:03   US RENAL  Result Date: 04/19/2023 CLINICAL DATA:  Acute on chronic kidney injury EXAM: RENAL / URINARY TRACT ULTRASOUND COMPLETE COMPARISON:  CT abdomen pelvis 04/18/2023 FINDINGS: Right Kidney: Renal measurements: 9.4 x 4.9 x 4.4 cm = volume: 105 mL. Echogenicity within normal limits. No mass or hydronephrosis visualized. Left Kidney: Renal measurements: 11.6 x 5.6 x 5.0 cm = volume: 169 mL. Echogenicity within normal limits. No mass or hydronephrosis visualized. Bladder: Limited evaluation due to to collapsed configuration. Other: Diffuse increased echogenicity of the visualized portions of the hepatic parenchyma are a nonspecific indicator of hepatocellular dysfunction, most commonly steatosis. IMPRESSION: No significant sonographic abnormality of the kidneys. Electronically Signed   By: Acquanetta Belling M.D.   On: 04/19/2023 10:55   CT ABDOMEN PELVIS WO CONTRAST  Result Date: 04/18/2023 CLINICAL DATA:  Right lower  quadrant abdominal pain EXAM: CT ABDOMEN AND PELVIS WITHOUT CONTRAST TECHNIQUE: Multidetector CT imaging of the abdomen and pelvis was performed following the standard protocol without IV contrast. RADIATION DOSE REDUCTION: This exam was performed according to the departmental dose-optimization program which includes automated exposure control, adjustment of the mA and/or kV according to patient size and/or use of iterative reconstruction technique. COMPARISON:  None Available. FINDINGS: Lower chest: No acute findings. Coronary artery and aortic calcifications. Hepatobiliary: Prior cholecystectomy. Diffuse fatty infiltration of the liver. No focal hepatic abnormality. Mild hepatomegaly with craniocaudal length 23 cm. Pancreas: No focal abnormality or ductal dilatation. Spleen: Splenomegaly with a craniocaudal length of 14.5 cm. This is stable since prior study. No focal abnormality. Adrenals/Urinary Tract: Punctate bilateral nonobstructing renal calculi. No ureteral stones or hydronephrosis. Adrenal glands and urinary bladder unremarkable. Stomach/Bowel: Stomach, large and small bowel grossly unremarkable. Vascular/Lymphatic: Aortic atherosclerosis. No evidence of aneurysm or adenopathy. Reproductive: Prior hysterectomy.  No adnexal masses. Other: No free fluid or free air. Musculoskeletal: No acute bony abnormality. IMPRESSION: Hepatic steatosis.  Hepatosplenomegaly. No acute findings in the abdomen or pelvis. Aortic atherosclerosis, coronary artery disease. Punctate bilateral nephrolithiasis.  No hydronephrosis. Electronically Signed   By: Charlett Nose M.D.   On: 04/18/2023 19:22   ECHOCARDIOGRAM COMPLETE  Result Date: 04/17/2023    ECHOCARDIOGRAM REPORT   Patient Name:   ANNAALICIA VIELMAN Czerwinski Date  of Exam: 04/17/2023 Medical Rec #:  [id number]      Height:       63.0 in Accession #:    [id number]     Weight:       245.0 lb Date of Birth:  [birth year]      BSA:          2.108 m Patient Age:    61 years       BP:            136/73 mmHg Patient Gender: F              HR:           71 bpm. [facility] Procedure: 2D Echo, Cardiac Doppler and Color Doppler Indications:    Chest Pain R07.9  History:        Patient has no prior history of Echocardiogram examinations.                 CAD, Arrythmias:Atrial Fibrillation, Signs/Symptoms:Chest Pain;                 Risk Factors:Dyslipidemia, Hypertension and Diabetes.  Sonographer:    Aron Baba Referring Phys: [id number] Ellsworth Lennox  Sonographer Comments: Patient is obese. IMPRESSIONS  1. Left ventricular ejection fraction, by estimation, is 55 to 60%. The left ventricle has normal function. The left ventricle has no regional wall motion abnormalities. Left ventricular diastolic parameters are consistent with Grade I diastolic dysfunction (impaired relaxation).  2. Right ventricular systolic function is normal. The right ventricular size is normal. Tricuspid regurgitation signal is inadequate for assessing PA pressure.  3. The mitral valve is abnormal. Mild mitral valve regurgitation. No evidence of mitral stenosis.  4. The aortic valve is tricuspid. Aortic valve regurgitation is not visualized. No aortic stenosis is present.  5. Aortic dilatation noted. 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  6. The inferior vena cava is normal in size with greater than 50% respiratory variability, suggesting right atrial pressure of 3 mmHg. FINDINGS  Left Ventricle: Left ventricular ejection fraction, by estimation, is 55 to 60%. The left ventricle has normal function. The left ventricle has no regional wall motion abnormalities. The left ventricular internal cavity size was normal in size. There is  no left ventricular hypertrophy. Left ventricular diastolic parameters are consistent with Grade I diastolic dysfunction (impaired relaxation). Normal left ventricular filling pressure. Right Ventricle: The right ventricular size is normal. Right vetricular wall  thickness was not well visualized. Right ventricular systolic function is normal. Tricuspid regurgitation signal is inadequate for assessing PA pressure. Left Atrium: Left atrial size was normal in size. Right Atrium: Right atrial size was normal in size. Pericardium: There is no evidence of pericardial effusion. Mitral Valve: The mitral valve is abnormal. Mild mitral valve regurgitation. No evidence of mitral valve stenosis. Tricuspid Valve: The tricuspid valve is normal in structure. Tricuspid valve regurgitation is trivial. No evidence of tricuspid stenosis. Aortic Valve: The aortic valve is tricuspid. Aortic valve regurgitation is not visualized. No aortic stenosis is present. Aortic valve mean gradient measures 5.4 mmHg. Aortic valve peak gradient measures 8.7 mmHg. Aortic valve area, by VTI measures 1.85 cm. Pulmonic Valve: The pulmonic valve was not well visualized. Pulmonic valve regurgitation is not visualized. No evidence of pulmonic stenosis. Aorta: The aortic root is normal in size and structure and aortic dilatation noted. 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 Venous: The inferior  vena cava is normal in size with greater than 50% respiratory variability, suggesting right atrial pressure of 3 mmHg. IAS/Shunts: No atrial level shunt detected by color flow Doppler.  LEFT VENTRICLE PLAX 2D LVIDd:         5.00 cm   Diastology LVIDs:         3.60 cm   LV e' medial:    13.20 cm/s LV PW:         0.90 cm   LV E/e' medial:  5.8 LV IVS:        0.80 cm   LV e' lateral:   6.22 cm/s LVOT diam:     2.00 cm   LV E/e' lateral: 12.3 LV SV:         67 LV SV Index:   32 LVOT Area:     3.14 cm  RIGHT VENTRICLE RV S prime:     12.60 cm/s TAPSE (M-mode): 2.2 cm LEFT ATRIUM             Index        RIGHT ATRIUM           Index LA diam:        4.00 cm 1.90 cm/m   RA Area:     10.10 cm LA Vol (A2C):   69.3 ml 32.88 ml/m  RA Volume:   20.40 ml  9.68 ml/m LA Vol (A4C):   65.9 ml 31.26 ml/m LA  Biplane Vol: 70.8 ml 33.59 ml/m  AORTIC VALVE AV Area (Vmax):    1.94 cm AV Area (Vmean):   1.69 cm AV Area (VTI):     1.85 cm AV Vmax:           147.81 cm/s AV Vmean:          109.081 cm/s AV VTI:            0.360 m AV Peak Grad:      8.7 mmHg AV Mean Grad:      5.4 mmHg LVOT Vmax:         91.34 cm/s LVOT Vmean:        58.622 cm/s LVOT VTI:          0.212 m LVOT/AV VTI ratio: 0.59  AORTA Ao Root diam: 3.40 cm Ao Asc diam:  3.60 cm MITRAL VALVE MV Area (PHT): 4.71 cm     SHUNTS MV Decel Time: 161 msec     Systemic VTI:  0.21 m MR Peak grad: 82.2 mmHg     Systemic Diam: 2.00 cm MR Vmax:      453.33 cm/s MV E velocity: 76.60 cm/s MV A velocity: 108.00 cm/s MV E/A ratio:  0.71 Dina Rich MD Electronically signed by Dina Rich MD Signature Date/Time: 04/17/2023/1:33:26 PM    Final    CT CHEST ABDOMEN PELVIS WO CONTRAST  Result Date: 04/16/2023 CLINICAL DATA:  Sharp chest pain radiating to back. Left-sided rib pain and right-sided abdominal pain. EXAM: CT CHEST, ABDOMEN AND PELVIS WITHOUT CONTRAST TECHNIQUE: Multidetector CT imaging of the chest, abdomen and pelvis was performed following the standard protocol without IV contrast. RADIATION DOSE REDUCTION: This exam was performed according to the departmental dose-optimization program which includes automated exposure control, adjustment of the mA and/or kV according to patient size and/or use of iterative reconstruction technique. COMPARISON:  10/07/2022. FINDINGS: CT CHEST FINDINGS Cardiovascular: The heart is normal in size and there is no pericardial effusion. Three-vessel coronary artery calcifications are noted. There is atherosclerotic calcification of  the aorta without evidence of aneurysm. The pulmonary trunk is normal in caliber. Mediastinum/Nodes: No mediastinal or hilar lymphadenopathy. Evaluation of the hila is limited due to lack of IV contrast. Coarse calcification is noted in the left lobe of the thyroid gland. The trachea and esophagus  are within normal limits. Lungs/Pleura: Mild atelectasis is present bilaterally. No effusion or pneumothorax. Small calcified granuloma are present bilaterally. Musculoskeletal: Degenerative changes are present in the thoracic spine. No acute fracture is seen. CT ABDOMEN PELVIS FINDINGS Hepatobiliary: No focal liver abnormality is seen. The liver is mildly enlarged. No gallstones, gallbladder wall thickening, or biliary dilatation. Pancreas: Unremarkable. No pancreatic ductal dilatation or surrounding inflammatory changes. Spleen: The spleen is enlarged at 14.1 cm in length. No focal abnormality. Adrenals/Urinary Tract: The adrenal glands are within normal limits. A nonobstructive renal calculus is noted on the right. No hydroureteronephrosis bilaterally. The bladder is unremarkable. Stomach/Bowel: Stomach is within normal limits. Appendix is surgically absent. No evidence of bowel wall thickening, distention, or inflammatory changes. No free air or pneumatosis. Vascular/Lymphatic: Aortic atherosclerosis. No enlarged abdominal or pelvic lymph nodes. Reproductive: Status post hysterectomy. No adnexal masses. Other: No abdominopelvic ascites. Subcutaneous fat stranding is noted in the anterior abdominal wall in the left lower quadrant, unchanged from the prior exam. Musculoskeletal: Degenerative changes are present in the lumbar spine. No acute fracture. IMPRESSION: 1. No acute process in the chest, abdomen, or pelvis. 2. Nonobstructive right renal calculus. 3. Mild hepatosplenomegaly. 4. Subcutaneous fat stranding in the anterior abdominal wall in the left lower quadrant, unchanged from the prior exam, possible cellulitis. 5. Coronary artery calcifications. 6. Aortic atherosclerosis. Electronically Signed   By: Thornell Sartorius M.D.   On: 04/16/2023 00:19   DG Chest 2 View  Result Date: 04/15/2023 CLINICAL DATA:  Left-sided chest pain for 2 days EXAM: CHEST - 2 VIEW COMPARISON:  03/30/2023 FINDINGS: Frontal and  lateral views of the chest demonstrate an unremarkable cardiac silhouette. No airspace disease, effusion, or pneumothorax. No acute bony abnormality. IMPRESSION: 1. No acute intrathoracic process. Electronically Signed   By: Sharlet Salina M.D.   On: 04/15/2023 22:33    Barnetta Chapel, MD  [facility]  If 7PM-7AM, please contact night-coverage [email or web address] Password [email or web address] 04/30/2023, 7:25 PM   LOS: 13 days

## 2023-04-30 NOTE — Plan of Care (Signed)
  Problem: Education: Goal: Ability to describe self-care measures that may prevent or decrease complications (Diabetes Survival Skills Education) will improve Outcome: Progressing Goal: Individualized Educational Video(s) Outcome: Progressing   Problem: Fluid Volume: Goal: Ability to maintain a balanced intake and output will improve Outcome: Progressing   Problem: Nutritional: Goal: Maintenance of adequate nutrition will improve Outcome: Progressing   Problem: Tissue Perfusion: Goal: Adequacy of tissue perfusion will improve Outcome: Progressing   Problem: Clinical Measurements: Goal: Diagnostic test results will improve Outcome: Progressing   Problem: Elimination: Goal: Will not experience complications related to bowel motility Outcome: Progressing Goal: Will not experience complications related to urinary retention Outcome: Progressing

## 2023-04-30 NOTE — Progress Notes (Signed)
Nutrition Follow-up  DOCUMENTATION CODES:   Morbid obesity  INTERVENTION:   -Continue Magic cup BID with meals, each supplement provides 290 kcal and 9 grams of protein  -Continue MVI  NUTRITION DIAGNOSIS:   Increased nutrient needs related to acute illness as evidenced by estimated needs.  Ongoing  GOAL:   Patient will meet greater than or equal to 90% of their needs  Progressing   MONITOR:   PO intake, Supplement acceptance  REASON FOR ASSESSMENT:   Malnutrition Screening Tool    ASSESSMENT:   Pt with medical history significant of CAD s/p PCI, hypertension, hyperlipidemia, T2DM, DVT on Eliquis who presents with complaints of chest pain.  8/9 R heart cath     Visited patient at bedside who aroused easily from sleeping. She reports she has been eating well but got sick to her stomach lastnight when she was experiencing chest pain and had an episode of emesis. Patient is pending discharge. She has not eaten anything since vomiting last night incase she goes for a cardiac catheter prior to discharging.   Labs: Glu 256, Na 133, Cr 2.49, BUN 37 Meds: reviewed   Wt: 8/8 255#, current 245#?  PO: no meals documented since 8/7, patient was eating 50-90% of her meals prior to 8/7 per meal documentation   I/O's:  +296 ml since admission   Diet Order:   Diet Order             Diet Heart Room service appropriate? Yes; Fluid consistency: Thin  Diet effective now                   EDUCATION NEEDS:   Education needs have been addressed  Skin:  Skin Assessment: Reviewed RN Assessment  Last BM:  8/14  Height:   Ht Readings from Last 1 Encounters:  04/16/23 5\' 3"  (1.6 m)    Weight:   Wt Readings from Last 1 Encounters:  04/30/23 111.1 kg    Ideal Body Weight:  52.3 kg  BMI:  Body mass index is 43.4 kg/m.  Estimated Nutritional Needs:   Kcal:  1550-1750  Protein:  80-95 grams  Fluid:  > 1.5 L    Leodis Rains, RDN, LDN  Clinical  Nutrition

## 2023-04-30 NOTE — Progress Notes (Addendum)
Rounding Note    Patient Name: [name] Date of Encounter: 04/30/2023  [facility] Cardiologist: Marjo Bicker, MD   Subjective   Still with episodes of chest pain overnight. Complains of knots in her legs, also vision disturbances. Wanting to proceed with cardiac cath.  Inpatient Medications    Scheduled Meds:  amLODipine  5 mg Oral Daily   atorvastatin  80 mg Oral q morning   carvedilol  12.5 mg Oral BID WC   clonazePAM  0.5 mg Oral TID   clopidogrel  75 mg Oral Daily   darbepoetin (ARANESP) injection - NON-DIALYSIS  60 mcg Subcutaneous Q Fri-1800   enoxaparin (LOVENOX) injection  120 mg Subcutaneous Q24H   ezetimibe  10 mg Oral Daily   furosemide  40 mg Intravenous BID   gabapentin  300 mg Oral QHS   insulin aspart  0-15 Units Subcutaneous TID WC   insulin aspart  14 Units Subcutaneous TID WC   insulin detemir  50 Units Subcutaneous BID   isosorbide mononitrate  60 mg Oral q morning   lamoTRIgine  100 mg Oral BID   multivitamin with minerals  1 tablet Oral Daily   omega-3 acid ethyl esters  1 g Oral BID   polyethylene glycol  17 g Oral BID   senna-docusate  2 tablet Oral QHS   sertraline  100 mg Oral Daily   sodium chloride flush  3 mL Intravenous Q12H   Continuous Infusions:  sodium chloride Stopped (04/28/23 1915)   PRN Meds: sodium chloride, acetaminophen **OR** acetaminophen, alum & mag hydroxide-simeth, bisacodyl, diphenhydrAMINE, fentaNYL (SUBLIMAZE) injection, nitroGLYCERIN, ondansetron **OR** ondansetron (ZOFRAN) IV, mouth rinse, oxyCODONE, prochlorperazine, sodium chloride flush   Vital Signs    Vitals:   04/29/23 1600 04/29/23 1959 04/30/23 0601 04/30/23 0743  BP: (!) 137/54 (!) 154/62 (!) 142/54 121/60  Pulse: (!) 57 65 62 (!) 59  Resp: 18 16 20 19   Temp: 97.6 F (36.4 C) 98.3 F (36.8 C) 98.5 F (36.9 C) 98.5 F (36.9 C)  TempSrc: Oral Oral Oral Oral  SpO2: 95% 96% 99%   Weight:   111.1 kg   Height:         Intake/Output Summary (Last 24 hours) at 04/30/2023 0944 Last data filed at 04/29/2023 2324 Gross per 24 hour  Intake 6 ml  Output --  Net 6 ml      04/30/2023    6:01 AM 04/29/2023    6:30 AM 04/28/2023    6:59 AM  Last 3 Weights  Weight (lbs) 245 lb 248 lb 9.6 oz 246 lb 12.8 oz  Weight (kg) 111.131 kg 112.764 kg 111.948 kg      Telemetry    Sinus Rhythm - Personally Reviewed  Physical Exam   GEN: No acute distress. Morbidly obese female Neck: No JVD Cardiac: RRR, no murmurs, rubs, or gallops.  Respiratory: Clear to auscultation bilaterally. GI: Soft, nontender, non-distended  MS: No edema; No deformity. Neuro:  Nonfocal  Psych: Normal affect   Labs    High Sensitivity Troponin:   Recent Labs  Lab 04/22/23 0432 04/24/23 1221 04/24/23 1436 04/27/23 1608 04/27/23 1817  TROPONINIHS 4 4 4 5 5      Chemistry Recent Labs  Lab 04/28/23 0040 04/29/23 0103 04/30/23 0037  NA 132* 135 133*  K 3.8 3.9 4.0  CL 94* 98 94*  CO2 27 26 27   GLUCOSE 187* 129* 256*  BUN 36* 34* 37*  CREATININE 2.78* 2.76* 2.49*  CALCIUM  8.1* 8.1* 8.6*  ALBUMIN 2.6* 2.8* 3.1*  GFRNONAA 19* 19* 22*  ANIONGAP 11 11 12     Lipids No results for input(s): "CHOL", "TRIG", "HDL", "LABVLDL", "LDLCALC", "CHOLHDL" in the last 168 hours.  Hematology Recent Labs  Lab 04/27/23 0118 04/28/23 0040 04/29/23 0103  WBC 5.5 5.4 5.2  RBC 3.15* 3.20* 3.22*  HGB 8.5* 8.7* 8.7*  HCT 27.6* 28.2* 28.3*  MCV 87.6 88.1 87.9  MCH 27.0 27.2 27.0  MCHC 30.8 30.9 30.7  RDW 17.9* 18.1* 18.1*  PLT 173 179 182   Thyroid No results for input(s): "TSH", "FREET4" in the last 168 hours.  BNPNo results for input(s): "BNP", "PROBNP" in the last 168 hours.  DDimer No results for input(s): "DDIMER" in the last 168 hours.   Radiology    No results found.  Cardiac Studies   Echo 04/17/23 IMPRESSIONS    1. Left ventricular ejection fraction, by estimation, is 55 to 60%. The  left ventricle has normal  function. The left ventricle has no regional  wall motion abnormalities. Left ventricular diastolic parameters are  consistent with Grade I diastolic  dysfunction (impaired relaxation).   2. Right ventricular systolic function is normal. The right ventricular  size is normal. Tricuspid regurgitation signal is inadequate for assessing  PA pressure.   3. The mitral valve is abnormal. Mild mitral valve regurgitation. No  evidence of mitral stenosis.   4. The aortic valve is tricuspid. Aortic valve regurgitation is not  visualized. No aortic stenosis is present.   5. Aortic dilatation noted. There is mild dilatation of the ascending  aorta, measuring 36 mm.   6. The inferior vena cava is normal in size with greater than 50%  respiratory variability, suggesting right atrial pressure of 3 mmHg.    Limited Echo 04/22/23 IMPRESSIONS    1. Limited Echo for IVC check.   2. Left ventricular ejection fraction, by estimation, is 60 to 65%. The  left ventricle has normal function. Left ventricular diastolic function  could not be evaluated.   3. Right ventricular systolic function is normal. The right ventricular  size is normal. There is normal pulmonary artery systolic pressure.   4. The inferior vena cava is normal in size with <50% respiratory  variability, suggesting right atrial pressure of 8 mmHg.    RHC 04/25/23   Hemodynamic findings consistent with mild pulmonary hypertension.   Mild elevation of right heart pressures  Patient Profile     62 y.o. female  with a history of CKD, CAD with PCI 2022, PAF on eliquis, HLD, DM2 with hyperglycemia, and anemia presented to [facility] with chest pain   Assessment & Plan    Chest pain CAD s/p DES to RCA '2022 (In [place]) -- Complained of sharp left-sided chest pain that radiated to her back with associated dizziness and nausea.  High-sensitivity troponins have been negative with no significant EKG changes.  Left heart catheterization was considered but  given her worsening renal function would favor continued medical therapy. She continues to complain of chest pain, states she would "sign papers to get the cath". Will discuss diagnostic cath with MD. -- on Coreg 12.5 mg twice daily, Plavix 75 mg daily, amlodipine 5mg  daily, Imdur 60 mg daily. Stopped aspirin with the need for oral anticoagulation. -- continues to have chest pain, increase Imdur to 90mg  daily   HFpEF -- Echo 8/1 with LVEF of 55 to 60%, no regional wall motion abnormality, normal RV.  Repeat limited echo on 8/6 with  continued LVEF of 60 to 65%, normal RV, normal pulmonary artery systolic pressure. -- Right heart cath 8/9 with hemodynamics consistent with mild pulmonary hypertension with mild elevation of right heart pressures -- Nephrology following, managing diuretics   Acute on chronic CKD stage IIIb -- Creatinine peaked at 2.78, 2.76>>2.49 this morning -- Nephrology following, has been restarted Lasix 40 mg twice daily   Hypertension -- Well-controlled -- Continue Coreg 12.5 mg twice daily, Imdur 90 mg daily, amlodipine 5 mg daily   Paroxysmal atrial fibrillation -- Currently on Lovenox 120 mg daily, would plan to transition back to Eliquis as clear no plans for invasive workup.  Will review with MD   Hyperlipidemia -- Direct LDL 120, HDL 126, triglycerides 448 -- Continue atorvastatin 80 mg daily, Zetia and Lovaza    DM -- SSI  For questions or updates, please contact [facility] Please consult [email or web address] for contact info under        Signed, Laverda Page, NP  04/30/2023, 9:44 AM     Patient seen and examined. Agree with assessment and plan.  Presently, patient feels well.  She has experienced some intermittent in the left upper epigastric discomfort.  She was able to walk in the hallway today without any chest pressure development.  Will repeat her EKG today.  CTs have remained stable.  Slightly improved but still elevated at 2.49.  At present,  I have advised to defer cardiac catheterization unless she develops more worrisome symptomatology.  She is in agreement.  Her blood pressure is controlled on carvedilol 12.5 mg twice a day, isosorbide mononitrate 90 mg in addition to amlodipine 5 mg.   Lennette Bihari, MD, [facility] 04/30/2023 2:42 PM

## 2023-04-30 NOTE — Progress Notes (Signed)
Pt is a 62 y.o. yo female with longstanding DM and HTN who was admitted on 04/15/2023 with CP   Assessment/Plan: 1. Renal-  baseline crt 1.1 to 1.4-  ( was 1.49 on 03/30/23) , on 7/30 1.9 upon admission and then progressed to 2.5-2.75.  UA bland and US unremarkable-  occasional lower BPs noted during course but not on ACE/ARB.  The plan was in place to get renal artery duplex for further workup.  There are no indications for dialysis so no plans for such-   she is nonoliguric (1L/24h).  orthostatics slightly positive by pulse. Last dose of Lasix 80mg  was on 8/11  Restarted Lasix 40mg  BID on 8/13. Renal function starting to improve.   2. CV -  right heart cath 8/9 c/w some pulmonary HTN and volume overload-  had been diuresed and tolerated  well -    If cards felt strongly about pursuing left heart cath we would do whatever we needed to do -  not sure that she would require dialysis even then.  Difficult situation as I am not sure she will ever say that she feels good so difficult to interpret her sxms.   3. Anemia-  is not helping things-  last 4/4 dose of IV iron given 8/12 and ESA on 8/9  5. HTN/volume-  coreg 12.5 BID and imdur 90-  given lasix -  now that her crt is bumping-  was orthostatic by pulse   Subjective:  says she feels a little worse. Denies f/c/n/v; main problem was pain in lower abd.  Cardiology may be planning for left heart cath ?      Objective Vital signs in last 24 hours: Vitals:   04/29/23 1600 04/29/23 1959 04/30/23 0601 04/30/23 0743  BP: (!) 137/54 (!) 154/62 (!) 142/54 121/60  Pulse: (!) 57 65 62 (!) 59  Resp: 18 16 20 19   Temp: 97.6 F (36.4 C) 98.3 F (36.8 C) 98.5 F (36.9 C) 98.5 F (36.9 C)  TempSrc: Oral Oral Oral Oral  SpO2: 95% 96% 99%   Weight:   111.1 kg   Height:       Weight change: -1.633 kg  Intake/Output Summary (Last 24 hours) at 04/30/2023 1304 Last data filed at 04/29/2023 2324 Gross per 24 hour  Intake 3 ml  Output --  Net 3 ml       ,  W    Labs: Basic Metabolic Panel: Recent Labs  Lab 04/28/23 0040 04/29/23 0103 04/30/23 0037  NA 132* 135 133*  K 3.8 3.9 4.0  CL 94* 98 94*  CO2 27 26 27   GLUCOSE 187* 129* 256*  BUN 36* 34* 37*  CREATININE 2.78* 2.76* 2.49*  CALCIUM 8.1* 8.1* 8.6*  PHOS 4.5 3.8 4.0   Liver Function Tests: Recent Labs  Lab 04/28/23 0040 04/29/23 0103 04/30/23 0037  ALBUMIN 2.6* 2.8* 3.1*   No results for input(s): "LIPASE", "AMYLASE" in the last 168 hours. No results for input(s): "AMMONIA" in the last 168 hours. CBC: Recent Labs  Lab 04/25/23 0712 04/25/23 1402 04/26/23 0238 04/27/23 0118 04/28/23 0040 04/29/23 0103  WBC 4.4  --  4.6 5.5 5.4 5.2  HGB 9.0*   < > 8.6* 8.5* 8.7* 8.7*  HCT 28.8*   < > 27.0* 27.6* 28.2* 28.3*  MCV 86.7  --  86.5 87.6 88.1 87.9  PLT 151  --  161 173 179 182   < > = values in this interval not displayed.   Cardiac  Enzymes: No results for input(s): "CKTOTAL", "CKMB", "CKMBINDEX", "TROPONINI" in the last 168 hours.  CBG: Recent Labs  Lab 04/29/23 1145 04/29/23 1558 04/29/23 2103 04/30/23 0745 04/30/23 1130  GLUCAP 161* 227* 145* 241* 120*    Iron Studies: No results for input(s): "IRON", "TIBC", "TRANSFERRIN", "FERRITIN" in the last 72 hours. Studies/Results: No results found. Medications: Infusions:  sodium chloride Stopped (04/28/23 1915)    Scheduled Medications:  amLODipine  5 mg Oral Daily   atorvastatin  80 mg Oral q morning   carvedilol  12.5 mg Oral BID WC   clonazePAM  0.5 mg Oral TID   clopidogrel  75 mg Oral Daily   darbepoetin (ARANESP) injection - NON-DIALYSIS  60 mcg Subcutaneous Q Fri-1800   enoxaparin (LOVENOX) injection  120 mg Subcutaneous Q24H   ezetimibe  10 mg Oral Daily   furosemide  40 mg Intravenous BID   gabapentin  300 mg Oral QHS   insulin aspart  0-15 Units Subcutaneous TID WC   insulin aspart  14 Units Subcutaneous TID WC   insulin detemir  50 Units Subcutaneous BID   [START  ON 05/01/2023] isosorbide mononitrate  90 mg Oral q morning   lamoTRIgine  100 mg Oral BID   multivitamin with minerals  1 tablet Oral Daily   omega-3 acid ethyl esters  1 g Oral BID   polyethylene glycol  17 g Oral BID   senna-docusate  2 tablet Oral QHS   sertraline  100 mg Oral Daily   sodium chloride flush  3 mL Intravenous Q12H    have reviewed scheduled and prn medications.  Physical Exam: General: obese, no distress but says she feels terrible Heart: RRR Lungs: mostly clear Abdomen: obese, soft non tender Extremities:no obvious peripheral edema     04/30/2023,1:04 PM  LOS: 13 days

## 2023-04-30 NOTE — Plan of Care (Signed)

## 2023-05-01 DIAGNOSIS — R0789 Other chest pain: Secondary | ICD-10-CM | POA: Diagnosis not present

## 2023-05-01 DIAGNOSIS — I2510 Atherosclerotic heart disease of native coronary artery without angina pectoris: Secondary | ICD-10-CM | POA: Diagnosis not present

## 2023-05-01 DIAGNOSIS — I10 Essential (primary) hypertension: Secondary | ICD-10-CM | POA: Diagnosis not present

## 2023-05-01 DIAGNOSIS — N179 Acute kidney failure, unspecified: Secondary | ICD-10-CM | POA: Diagnosis not present

## 2023-05-01 LAB — RENAL FUNCTION PANEL
Albumin: 3.3 g/dL — ABNORMAL LOW (ref 3.5–5.0)
Anion gap: 14 (ref 5–15)
BUN: 37 mg/dL — ABNORMAL HIGH (ref 6–20)
CO2: 28 mmol/L (ref 22–32)
Calcium: 8.5 mg/dL — ABNORMAL LOW (ref 8.9–10.3)
Chloride: 91 mmol/L — ABNORMAL LOW (ref 98–111)
Creatinine, Ser: 2.65 mg/dL — ABNORMAL HIGH (ref 0.44–1.00)
GFR, Estimated: 20 mL/min — ABNORMAL LOW (ref >60)
Glucose, Bld: 179 mg/dL — ABNORMAL HIGH (ref 70–99)
Phosphorus: 4.7 mg/dL — ABNORMAL HIGH (ref 2.5–4.6)
Potassium: 3.5 mmol/L (ref 3.5–5.1)
Sodium: 133 mmol/L — ABNORMAL LOW (ref 135–145)

## 2023-05-01 LAB — GLUCOSE, CAPILLARY
Glucose-Capillary: 187 mg/dL — ABNORMAL HIGH (ref 70–99)
Glucose-Capillary: 189 mg/dL — ABNORMAL HIGH (ref 70–99)
Glucose-Capillary: 260 mg/dL — ABNORMAL HIGH (ref 70–99)
Glucose-Capillary: 249 mg/dL — ABNORMAL HIGH (ref 70–99)
Glucose-Capillary: 310 mg/dL — ABNORMAL HIGH (ref 70–99)

## 2023-05-01 MED ORDER — INSULIN ASPART 100 UNIT/ML IJ SOLN
0.0000 [IU] | Freq: Three times a day (TID) | INTRAMUSCULAR | Status: DC
  Administered 2023-05-01: 8 [IU] via SUBCUTANEOUS
  Administered 2023-05-01: 5 [IU] via SUBCUTANEOUS
  Administered 2023-05-02: 3 [IU] via SUBCUTANEOUS
  Administered 2023-05-02: 5 [IU] via SUBCUTANEOUS
  Administered 2023-05-02: 3 [IU] via SUBCUTANEOUS
  Administered 2023-05-03: 5 [IU] via SUBCUTANEOUS
  Administered 2023-05-03: 15 [IU] via SUBCUTANEOUS

## 2023-05-01 MED ORDER — INSULIN ASPART 100 UNIT/ML IJ SOLN
0.0000 [IU] | Freq: Every day | INTRAMUSCULAR | Status: DC
  Administered 2023-05-01: 4 [IU] via SUBCUTANEOUS
  Administered 2023-05-02: 2 [IU] via SUBCUTANEOUS

## 2023-05-01 MED ORDER — ENOXAPARIN SODIUM 120 MG/0.8ML IJ SOSY: 110.0000 mg | PREFILLED_SYRINGE | Freq: Every day | INTRAMUSCULAR | Status: DC

## 2023-05-01 MED ORDER — APIXABAN 5 MG PO TABS
5.0000 mg | ORAL_TABLET | Freq: Two times a day (BID) | ORAL | Status: DC
  Administered 2023-05-01 – 2023-05-03 (×5): 5 mg via ORAL
  Filled 2023-05-01 (×4): qty 1

## 2023-05-01 NOTE — Progress Notes (Signed)
Pt is a 62 y.o. yo female with longstanding DM and HTN who was admitted on 04/15/2023 with CP   Assessment/Plan: 1. Renal-  baseline crt 1.1 to 1.4-  ( was 1.49 on 03/30/23) , on 7/30 1.9 upon admission and then progressed to 2.5-2.75.  UA bland and US unremarkable-  occasional lower BPs noted during course but not on ACE/ARB.  The plan was in place to get renal artery duplex for further workup.  There are no indications for dialysis so no plans for such-   she is nonoliguric (1L/24h).  orthostatics slightly positive by pulse. Last dose of Lasix 80mg  was on 8/11  Restarted Lasix 40mg  BID on 8/13. Renal function seems to be consistently in the 2.4-2.6 range in the past week. Will keep on Lasix for another 24hrs and if no improvement tomorrow will hold.  According to weights she is down 1.2kg but she is +~2.5L on I&O's, no edema on exam and breathing comfortably.  2. CV -  right heart cath 8/9 c/w some pulmonary HTN and volume overload-  had been diuresed and tolerated  well -    If cards felt strongly about pursuing left heart cath we would do whatever we needed to do -  not sure that she would require dialysis even then.  Difficult situation as I am not sure she will ever say that she feels good so difficult to interpret her sxms.   3. Anemia-  is not helping things-  last 4/4 dose of IV iron given 8/12 and ESA on 8/9  5. HTN/volume-  coreg 12.5 BID and imdur 90-  given lasix -  now that her crt is bumping-  was orthostatic by pulse   Subjective:  says she feels a little better today but still not well. Denies f/c/n/v; main problem was pain in lower abd.  Cardiology may be planning for left heart cath ?      Objective Vital signs in last 24 hours: Vitals:   04/30/23 0743 04/30/23 1609 04/30/23 2111 05/01/23 0346  BP: 121/60 (!) 117/54 (!) 114/50 125/60  Pulse: (!) 59 61 63 62  Resp: 19 18 18 18   Temp: 98.5 F (36.9 C) 98 F (36.7 C) 98.3 F (36.8 C) 98.3 F (36.8 C)  TempSrc: Oral Oral  Oral Oral  SpO2:   96% 95%  Weight:    109.9 kg  Height:       Weight change: -1.225 kg  Intake/Output Summary (Last 24 hours) at 05/01/2023 0824 Last data filed at 05/01/2023 0028 Gross per 24 hour  Intake 483 ml  Output --  Net 483 ml      ,  W    Labs: Basic Metabolic Panel: Recent Labs  Lab 04/29/23 0103 04/30/23 0037 05/01/23 0724  NA 135 133* 133*  K 3.9 4.0 3.5  CL 98 94* 91*  CO2 26 27 28   GLUCOSE 129* 256* 179*  BUN 34* 37* 37*  CREATININE 2.76* 2.49* 2.65*  CALCIUM 8.1* 8.6* 8.5*  PHOS 3.8 4.0 4.7*   Liver Function Tests: Recent Labs  Lab 04/29/23 0103 04/30/23 0037 05/01/23 0724  ALBUMIN 2.8* 3.1* 3.3*   No results for input(s): "LIPASE", "AMYLASE" in the last 168 hours. No results for input(s): "AMMONIA" in the last 168 hours. CBC: Recent Labs  Lab 04/25/23 0712 04/25/23 1402 04/26/23 0238 04/27/23 0118 04/28/23 0040 04/29/23 0103  WBC 4.4  --  4.6 5.5 5.4 5.2  HGB 9.0*   < > 8.6* 8.5* 8.7*  8.7*  HCT 28.8*   < > 27.0* 27.6* 28.2* 28.3*  MCV 86.7  --  86.5 87.6 88.1 87.9  PLT 151  --  161 173 179 182   < > = values in this interval not displayed.   Cardiac Enzymes: No results for input(s): "CKTOTAL", "CKMB", "CKMBINDEX", "TROPONINI" in the last 168 hours.  CBG: Recent Labs  Lab 04/30/23 1130 04/30/23 1612 04/30/23 2106 05/01/23 0341 05/01/23 0806  GLUCAP 120* 244* 290* 187* 189*    Iron Studies: No results for input(s): "IRON", "TIBC", "TRANSFERRIN", "FERRITIN" in the last 72 hours. Studies/Results: No results found. Medications: Infusions:  sodium chloride Stopped (04/28/23 1915)    Scheduled Medications:  amLODipine  5 mg Oral Daily   atorvastatin  80 mg Oral q morning   carvedilol  12.5 mg Oral BID WC   clonazePAM  0.5 mg Oral TID   clopidogrel  75 mg Oral Daily   darbepoetin (ARANESP) injection - NON-DIALYSIS  60 mcg Subcutaneous Q Fri-1800   enoxaparin (LOVENOX) injection  120 mg Subcutaneous Q24H    ezetimibe  10 mg Oral Daily   furosemide  40 mg Intravenous BID   gabapentin  300 mg Oral QHS   insulin aspart  0-15 Units Subcutaneous TID WC   insulin aspart  14 Units Subcutaneous TID WC   insulin detemir  50 Units Subcutaneous BID   isosorbide mononitrate  90 mg Oral q morning   lamoTRIgine  100 mg Oral BID   multivitamin with minerals  1 tablet Oral Daily   omega-3 acid ethyl esters  1 g Oral BID   polyethylene glycol  17 g Oral BID   senna-docusate  2 tablet Oral QHS   sertraline  100 mg Oral Daily   sodium chloride flush  3 mL Intravenous Q12H    have reviewed scheduled and prn medications.  Physical Exam: General: obese, no distress  Heart: RRR  Lungs: mostly clear Abdomen: obese, soft non tender Extremities:no obvious peripheral edema     05/01/2023,8:24 AM  LOS: 14 days

## 2023-05-01 NOTE — Progress Notes (Signed)
Physical Therapy Treatment Patient Details Name: [name] MRN: [id number] DOB: [birth year] Today's Date: 05/01/2023   History of Present Illness Pt is a 62 y.o. female admitted 04/15/23 with chest pain, dizziness, nausea. RHC 8/9 consistent with pulmonary HTN. Plan for medical therapy over LHC secondary to worsening renal function. PMH includes CAD s/p PCI, HTN, HLD, DM2, DVT on Eliquis.   PT Comments  Pt progressing with mobility. Pt ambulating with rollator for added stability and energy conservation with community distances. Pt c/o fatigue and dizziness with ambulation, which happens frequently since before admission; associated drop in BP, attending MD notified. Pt reports hopeful for discharge home soon. If to remain admitted, will continue to follow acutely to address established goals.     If plan is discharge home, recommend the following: A little help with bathing/dressing/bathroom;Assistance with cooking/housework;Assist for transportation   Can travel by private vehicle      Yes  Equipment Recommendations  Rollator (4 wheels);BSC/3in1    Recommendations for Other Services       Precautions / Restrictions Precautions Precautions: Fall;Other (comment) Precaution Comments: orthostatic hypotension during session Restrictions Weight Bearing Restrictions: No     Mobility  Bed Mobility Overal bed mobility: Modified Independent                  Transfers Overall transfer level: Modified independent Equipment used: None, Rollator (4 wheels) Transfers: Sit to/from Stand                  Ambulation/Gait Ambulation/Gait assistance: Supervision Gait Distance (Feet): 212 Feet Assistive device: Rollator (4 wheels) Gait Pattern/deviations: Step-through pattern, Decreased stride length, Trunk flexed Gait velocity: Decreased     General Gait Details: slow, fatigued gait with rollator and supervision for safety; pt self-monitoring activity tolerance,  endorses fatigue and dizziness is why she needed to walk back to room   Stairs             Wheelchair Mobility     Tilt Bed    Modified Rankin (Stroke Patients Only)       Balance Overall balance assessment: Needs assistance Sitting-balance support: No upper extremity supported, Feet supported Sitting balance-Leahy Scale: Good     Standing balance support: No upper extremity supported, During functional activity Standing balance-Leahy Scale: Good                              Cognition Arousal: Alert Behavior During Therapy: WFL for tasks assessed/performed Overall Cognitive Status: Within Functional Limits for tasks assessed                                 General Comments: WFL for simple tasks, not formally assessed        Exercises      General Comments General comments (skin integrity, edema, etc.): Sitting BP (post-ambulation) 147/89 (104), standing BP 109/84 (94). pt hopeful for discharge home soon to see her great-grandkids; educ re: activity recommendations, DME needs, energy conservation strategies, fall risk reduction      Pertinent Vitals/Pain Pain Assessment Pain Assessment: Faces Faces Pain Scale: Hurts a little bit Pain Location: chest, back Pain Descriptors / Indicators: Discomfort Pain Intervention(s): Monitored during session, Limited activity within patient's tolerance    Home Living  Prior Function            PT Goals (current goals can now be found in the care plan section) Progress towards PT goals: Progressing toward goals    Frequency    Min 1X/week      PT Plan      Co-evaluation              AM-PAC PT "6 Clicks" Mobility   Outcome Measure  Help needed turning from your back to your side while in a flat bed without using bedrails?: None Help needed moving from lying on your back to sitting on the side of a flat bed without using bedrails?:  None Help needed moving to and from a bed to a chair (including a wheelchair)?: None Help needed standing up from a chair using your arms (e.g., wheelchair or bedside chair)?: None Help needed to walk in hospital room?: A Little Help needed climbing 3-5 steps with a railing? : A Little 6 Click Score: 22    End of Session Equipment Utilized During Treatment: Gait belt Activity Tolerance: Patient tolerated treatment well;Patient limited by fatigue Patient left: in bed;with call bell/phone within reach Nurse Communication: Mobility status PT Visit Diagnosis: Unsteadiness on feet (R26.81);History of falling (Z91.81);Other abnormalities of gait and mobility (R26.89)     Time: 4098-1191 PT Time Calculation (min) (ACUTE ONLY): 22 min  Charges:    $Therapeutic Exercise: 8-22 mins PT General Charges $$ ACUTE PT VISIT: 1 Visit                      Ina Homes, PT, DPT Acute Rehabilitation Services  Personal: Secure Chat Rehab Office: [phone]  [name] 05/01/2023, 2:00 PM

## 2023-05-01 NOTE — Progress Notes (Signed)
Mobility Specialist Progress Note:    05/01/23 1211  Mobility  Activity Ambulated with assistance in hallway  Level of Assistance Contact guard assist, steadying assist  Assistive Device Front wheel walker  Distance Ambulated (ft) 150 ft  Activity Response Tolerated well  Mobility Referral Yes  $Mobility charge 1 Mobility  Mobility Specialist Start Time (ACUTE ONLY) 1114  Mobility Specialist Stop Time (ACUTE ONLY) 1128  Mobility Specialist Time Calculation (min) (ACUTE ONLY) 14 min   Received pt in bed having no complaints and agreeable to mobility. Pt was asymptomatic throughout ambulation and returned to room w/o fault. Left in bed w/ call bell in reach and all needs met.   Thompson Grayer Mobility Specialist  Please contact vis Secure Chat or  Rehab Office [phone]

## 2023-05-01 NOTE — Plan of Care (Signed)

## 2023-05-01 NOTE — TOC Progression Note (Signed)
Transition of Care [facility]) - Progression Note    Patient Details  Name: [name] MRN: [id number] Date of Birth: 07-21-1961  Transition of Care [facility]) CM/SW Contact  Graves-Bigelow, Lamar Laundry, RN Phone Number: 05/01/2023, 10:04 AM  Clinical Narrative: Case Manager received a message from OT that the patient will need a shower chair for home. Case Manager unable to get DME rollator or shower chair for the patient. Patient has [place] and no [employer] for DME. Patient will be given a regular rolling walker for safety. Patient is aware that she needs to get her Medicaid switched to the state that she will reside in. Patient currently lives in [place] with [place]. No further needs identified at this time.     Expected Discharge Plan: Home/Self Care Barriers to Discharge: Continued Medical Work up     Living arrangements for the past 2 months: Single Family Home    Social Determinants of Health (SDOH) Interventions SDOH Screenings   Food Insecurity: No Food Insecurity (04/16/2023)  Housing: Low Risk  (04/16/2023)  Transportation Needs: Unmet Transportation Needs (04/16/2023)  Utilities: Not At Risk (04/16/2023)  Tobacco Use: Medium Risk (04/15/2023)    Readmission Risk Interventions     No data to display

## 2023-05-01 NOTE — Progress Notes (Addendum)
Rounding Note    Patient Name: [name] Date of Encounter: 05/01/2023  [facility] Cardiologist: Marjo Bicker, MD   Subjective   Actually feeling much better today. Very minimal chest pain. Ambulated and did well yesterday.   Inpatient Medications    Scheduled Meds:  amLODipine  5 mg Oral Daily   atorvastatin  80 mg Oral q morning   carvedilol  12.5 mg Oral BID WC   clonazePAM  0.5 mg Oral TID   clopidogrel  75 mg Oral Daily   darbepoetin (ARANESP) injection - NON-DIALYSIS  60 mcg Subcutaneous Q Fri-1800   enoxaparin (LOVENOX) injection  110 mg Subcutaneous Daily   ezetimibe  10 mg Oral Daily   furosemide  40 mg Intravenous BID   gabapentin  300 mg Oral QHS   insulin aspart  0-15 Units Subcutaneous TID WC   insulin aspart  14 Units Subcutaneous TID WC   insulin detemir  50 Units Subcutaneous BID   isosorbide mononitrate  90 mg Oral q morning   lamoTRIgine  100 mg Oral BID   multivitamin with minerals  1 tablet Oral Daily   omega-3 acid ethyl esters  1 g Oral BID   polyethylene glycol  17 g Oral BID   senna-docusate  2 tablet Oral QHS   sertraline  100 mg Oral Daily   sodium chloride flush  3 mL Intravenous Q12H   Continuous Infusions:  sodium chloride Stopped (04/28/23 1915)   PRN Meds: sodium chloride, acetaminophen **OR** acetaminophen, alum & mag hydroxide-simeth, bisacodyl, diphenhydrAMINE, fentaNYL (SUBLIMAZE) injection, nitroGLYCERIN, ondansetron **OR** ondansetron (ZOFRAN) IV, mouth rinse, oxyCODONE, prochlorperazine, sodium chloride flush   Vital Signs    Vitals:   04/30/23 0743 04/30/23 1609 04/30/23 2111 05/01/23 0346  BP: 121/60 (!) 117/54 (!) 114/50 125/60  Pulse: (!) 59 61 63 62  Resp: 19 18 18 18   Temp: 98.5 F (36.9 C) 98 F (36.7 C) 98.3 F (36.8 C) 98.3 F (36.8 C)  TempSrc: Oral Oral Oral Oral  SpO2:   96% 95%  Weight:    109.9 kg  Height:        Intake/Output Summary (Last 24 hours) at 05/01/2023 0858 Last data  filed at 05/01/2023 0028 Gross per 24 hour  Intake 483 ml  Output --  Net 483 ml      05/01/2023    3:46 AM 04/30/2023    6:01 AM 04/29/2023    6:30 AM  Last 3 Weights  Weight (lbs) 242 lb 4.8 oz 245 lb 248 lb 9.6 oz  Weight (kg) 109.907 kg 111.131 kg 112.764 kg      Telemetry    Sinus Rhythm - Personally Reviewed  Physical Exam   GEN: No acute distress. Morbidly obese female  Neck: No JVD Cardiac: RRR, no murmurs, rubs, or gallops.  Respiratory: Clear to auscultation bilaterally. GI: Soft, nontender, non-distended  MS: No edema; No deformity. Neuro:  Nonfocal  Psych: Normal affect   Labs    High Sensitivity Troponin:   Recent Labs  Lab 04/22/23 0432 04/24/23 1221 04/24/23 1436 04/27/23 1608 04/27/23 1817  TROPONINIHS 4 4 4 5 5      Chemistry Recent Labs  Lab 04/29/23 0103 04/30/23 0037 05/01/23 0724  NA 135 133* 133*  K 3.9 4.0 3.5  CL 98 94* 91*  CO2 26 27 28   GLUCOSE 129* 256* 179*  BUN 34* 37* 37*  CREATININE 2.76* 2.49* 2.65*  CALCIUM 8.1* 8.6* 8.5*  ALBUMIN 2.8* 3.1* 3.3*  GFRNONAA 19* 22* 20*  ANIONGAP 11 12 14     Lipids No results for input(s): "CHOL", "TRIG", "HDL", "LABVLDL", "LDLCALC", "CHOLHDL" in the last 168 hours.  Hematology Recent Labs  Lab 04/27/23 0118 04/28/23 0040 04/29/23 0103  WBC 5.5 5.4 5.2  RBC 3.15* 3.20* 3.22*  HGB 8.5* 8.7* 8.7*  HCT 27.6* 28.2* 28.3*  MCV 87.6 88.1 87.9  MCH 27.0 27.2 27.0  MCHC 30.8 30.9 30.7  RDW 17.9* 18.1* 18.1*  PLT 173 179 182   Thyroid No results for input(s): "TSH", "FREET4" in the last 168 hours.  BNPNo results for input(s): "BNP", "PROBNP" in the last 168 hours.  DDimer No results for input(s): "DDIMER" in the last 168 hours.   Radiology    No results found.  Cardiac Studies   Echo 04/17/23 IMPRESSIONS    1. Left ventricular ejection fraction, by estimation, is 55 to 60%. The  left ventricle has normal function. The left ventricle has no regional  wall motion abnormalities.  Left ventricular diastolic parameters are  consistent with Grade I diastolic  dysfunction (impaired relaxation).   2. Right ventricular systolic function is normal. The right ventricular  size is normal. Tricuspid regurgitation signal is inadequate for assessing  PA pressure.   3. The mitral valve is abnormal. Mild mitral valve regurgitation. No  evidence of mitral stenosis.   4. The aortic valve is tricuspid. Aortic valve regurgitation is not  visualized. No aortic stenosis is present.   5. Aortic dilatation noted. There is mild dilatation of the ascending  aorta, measuring 36 mm.   6. The inferior vena cava is normal in size with greater than 50%  respiratory variability, suggesting right atrial pressure of 3 mmHg.    Limited Echo 04/22/23 IMPRESSIONS    1. Limited Echo for IVC check.   2. Left ventricular ejection fraction, by estimation, is 60 to 65%. The  left ventricle has normal function. Left ventricular diastolic function  could not be evaluated.   3. Right ventricular systolic function is normal. The right ventricular  size is normal. There is normal pulmonary artery systolic pressure.   4. The inferior vena cava is normal in size with <50% respiratory  variability, suggesting right atrial pressure of 8 mmHg.    RHC 04/25/23   Hemodynamic findings consistent with mild pulmonary hypertension.   Mild elevation of right heart pressures  Patient Profile     62 y.o. female with a history of CKD, CAD with PCI 2022, PAF on eliquis, HLD, DM2 with hyperglycemia, and anemia presented to [facility] with chest pain   Assessment & Plan    Chest pain CAD s/p DES to RCA '2022 ([place]) -- Complained of sharp left-sided chest pain that radiated to her back with associated dizziness and nausea.  High-sensitivity troponins have been negative with no significant EKG changes.  Left heart catheterization was considered but given her worsening renal function would favor continued medical therapy.  Actually reports feeling much better this morning, very minimal chest pain. Ambulated well yesterday afternoon -- on Coreg 12.5 mg twice daily, Plavix 75 mg daily, amlodipine 5mg  daily, Imdur 90 mg daily. Stopped aspirin with the need for oral anticoagulation. Will continue on current medications as she has experienced improvement in symptoms   HFpEF -- Echo 8/1 with LVEF of 55 to 60%, no regional wall motion abnormality, normal RV.  Repeat limited echo on 8/6 with continued LVEF of 60 to 65%, normal RV, normal pulmonary artery systolic pressure. -- Right heart  cath 8/9 with hemodynamics consistent with mild pulmonary hypertension with mild elevation of right heart pressures -- Nephrology following, managing diuretics   Acute on chronic CKD stage IIIb -- Creatinine peaked at 2.78, 2.76>>2.49>>2.6 this morning -- Nephrology following, has been restarted Lasix 40 mg twice daily   Hypertension -- Well-controlled -- Continue Coreg 12.5 mg twice daily, Imdur 90 mg daily, amlodipine 5 mg daily   Paroxysmal atrial fibrillation -- Currently on Lovenox 120 mg daily. Will stop and transition back to Eliquis this morning   Hyperlipidemia -- Direct LDL 120, HDL 126, triglycerides 448 -- Continue atorvastatin 80 mg daily, Zetia and Lovaza    DM -- SSI   For questions or updates, please contact [facility] Please consult [email or web address] for contact info under        Signed, Laverda Page, NP  05/01/2023, 8:58 AM      Patient seen and examined. Agree with assessment and plan.  Patient feels much better today.  She has been able to ambulate hallways without any chest pain.  Her right and left upper quadrant discomfort  has resolved.  For continued medical management without cardiac catheterization.  Creatinine is slightly increased to 2.6 this morning.  May need to decrease diuretic regimen.  Continue triple drug therapy for mixed hyperlipidemia.   Lennette Bihari, MD,  [facility] 05/01/2023 12:02 PM

## 2023-05-01 NOTE — Progress Notes (Signed)
PROGRESS NOTE  [name] MWN:[id number] DOB: Jul 10, 1961 DOA: 04/15/2023 PCP: Patient, No Pcp Per  Brief History:  62 y.o. female with medical history significant of CAD s/p PCI, hypertension, hyperlipidemia, T2DM, DVT on Eliquis who presents to the emergency department with complaints of chest pain.  Patient complained of left-sided chest pain which occurred yesterday while sitting in a car chest pain was described as sharp and nonreproducible and was rated as 8/10 on pain scale with radiation to the back, this was associated with dizziness and nausea without vomiting.  She states that she called her PCP who advised her to go to the ED for further evaluation and management.  Patient denies shortness of breath, diaphoresis, fever.  The patient's troponins have been unremarkable.  Echocardiogram on 04/22/2023 showed EF 60 to 65%, trivial MR, normal RV EF.  However, patient continued to have intermittent chest pain throughout her hospitalization.  Her hospitalization was prolonged due to development of acute on chronic renal failure with serum creatinine up to 2.56.  Nephrology was consulted.  The patient was started on IV heparin for her persistent chest pain.  She received IV heparin for about a week and it was ultimately discontinued on 04/24/2023.  After discussion with nephrology, it was felt that the patient likely may have new renal baseline.  After discussion with the patient of the risk, benefits, and alternatives, all parties involved agreed to pursue left heart catheterization.   04/25/2023: Patient underwent cardiac catheterization today.  Cardiology input is appreciated.  Postcatheterization, patient reports seeing spots, with associated left-sided eye pain.  On further questioning, patient reports prior episodes of similar symptoms.  CT stroke protocol has been activated.  Patient is currently on aspirin and Plavix.    04/28/2023: Patient continues to report intermittent chest pain.   Input from cardiology and nephrology team is highly appreciated.  Systolic blood pressure has ranged from 86 to 118 mmHg over the last 24 hours.  Urine output of approximately 1000 cc.  Serum creatinine is 2.78 today (stable).  Lasix is on hold.  Decision to proceed with cardiac catheterization deferred to cardiology and nephrology team.  04/29/2023: Patient seen.  No new complaints.  Serum creatinine is stable.  Nephrology input is appreciated.  Cardiology team is still managing patient supportively.  Discharge patient home once cleared for discharge by the cardiology team and nephrology team.  04/30/2023: Patient seen.  Renal function is slowly improving.  Patient continues to reports pain around the chest area.  Patient is actually depressed.  Input from nephrology team and cardiology team is highly appreciated.  Patient is already on Zoloft.  Likely discharge back home tomorrow if okay with the nephrology and cardiology teams.  05/01/2023: No chest pain reported today.  Mild worsening of renal function.  Nephrology input is appreciated.  Continue to monitor renal function.  Possible discharge in the next 24 to 48 hours.  Assessment/Plan: Chest pain with high risk for cardiac etiology/unstable angina - appreciate cardiology team consultation - continue supportive measures, IV heparin infusion and high dose statin  - working to improve creatinine in case cath is approved - cardio team planning to premedicate for cath given history of contrast allergy -has some atypical features--requiring fentanyl IV pushes for pain -Patient has received 8 days of intravenous heparin.  This was discontinued on 04/24/2023. -04/22/2023 echo EF 60 to 65%, normal RV EF, trivial TR. -After discussion with nephrology and the patient, it was  decided to go forward with left heart catheterization. 04/25/2023: Patient underwent cardiac catheterization today.  Awaiting full report. 04/26/2023: Patient continues to report chest pain.   Only right heart catheterization was done yesterday.  Cardiology is following patient.  Cardiology input is appreciated. 04/29/2023: Cardiology is directing care.  See above documentation. 04/30/2023: Patient is depressed.  Cardiology input is appreciated.  Patient is on Zoloft. 05/01/2023: No chest pain reported today.  AKI on CKD stage 3b  -Baseline creatinine 1.2-1.4 -Serum creatinine today is 2.78.   - renal US done 8/3 with normal findings reported  - urinalysis reassuring -Nephrology is directing care.   - CT stone study--negative hydronephrosis, bibasilar GGO--infx vs edema; trace bilateral pleural effusion, moderate stool burden 04/29/2023: Serum creatinine is stable. 04/20/2023: Serum creatinine continues to improve.  Likely discharge home tomorrow if okay with cardiology and nephrology team. 05/01/2023: Mild worsening of renal function.  Serum creatinine is 2.65.  pulmonary infiltrates -viral respiratory panel--negative -check COVID negative -check PCT < 0.10   Anemia in CKD  - Hg down to 7.7 with hydration - with her CAD, cardiology team want her Hg>8 - transfused 1 unit PRBC on 8/5 - Hg up to 9 after transfusion - follow   - hemoccult stools 05/01/2023: Last hemoglobin was 8.7 g/dL.   RLQ abdominal pain - intermittent, lipase test came back normal - pt reports appendectomy and cholecystectomy - CT abd/pelvis without contrast ordered (due to contrast allergy and upcoming cath) - 8/2 CT AP with findings of hepatomegaly and steatosis of liver, no acute findings to explain abd pain - 8/6 CT renal protocol--negative hydronephrosis, bibasilar GGO--infx vs edema; trace bilateral pleural effusion, moderate stool burden  CAD -continue ASA/plavix, statin -s/p RCA PCI in [place] 2022 04/27/2023: Cardiology team is directing care.  Possible cardiac catheterization in the morning.   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 - increased prandial novolog to 14 units TID with meals - continue home detemir 50 units BID  - 04/15/23 A1C--9.1    Chronic atrial fibrillation - carvedilol for heart rate control - holding apixaban in anticipation for possible cath later this week  - IV heparin for anticoagulation>>stopped 8/8   Hyperlipidemia - atorvastatin 80 mg daily    Essential Hypertension  - resumed home carvedilol with good BP control    Morbid Obesity -BMI 45.26 -lifestyle modification       Family Communication:   no Family at bedside   Consultants:  renal, cards   Code Status:  FULL   DVT Prophylaxis:  IV Heparin     Procedures: As Listed in Progress Note Above   Antibiotics: None   Subjective: No chest pain.  Objective: Vitals:   04/30/23 1609 04/30/23 2111 05/01/23 0346 05/01/23 1139  BP: (!) 117/54 (!) 114/50 125/60 (!) 141/58  Pulse: 61 63 62 65  Resp: 18 18 18 18   Temp: 98 F (36.7 C) 98.3 F (36.8 C) 98.3 F (36.8 C) 98.5 F (36.9 C)  TempSrc: Oral Oral Oral Oral  SpO2:  96% 95%   Weight:   109.9 kg   Height:        Intake/Output Summary (Last 24 hours) at 05/01/2023 1559 Last data filed at 05/01/2023 0028 Gross per 24 hour  Intake 483 ml  Output --  Net 483 ml  Weight change: -1.225 kg Exam:  General:  Pt is morbidly obese.  Not in any distress.  Awake and alert.   HEENT: Patient is pale.  No jaundice.   Cardiovascular: S1-S2. Respiratory: Clear to auscultation. Abdomen: Obese, soft and nontender. Neuro: Awake and alert.  Moves all extremities.    Extremities: Edema is improving.  Data Reviewed: I have personally reviewed following labs and imaging studies Basic Metabolic Panel: Recent Labs  Lab 04/27/23 0118 04/28/23 0040 04/29/23 0103 04/30/23 0037 05/01/23 0724  NA 136 132* 135 133* 133*  K 3.8 3.8 3.9 4.0 3.5  CL 97*  94* 98 94* 91*  CO2 26 27 26 27 28   GLUCOSE 209* 187* 129* 256* 179*  BUN 33* 36* 34* 37* 37*  CREATININE 2.75* 2.78* 2.76* 2.49* 2.65*  CALCIUM 8.3* 8.1* 8.1* 8.6* 8.5*  PHOS 5.0* 4.5 3.8 4.0 4.7*   Liver Function Tests: Recent Labs  Lab 04/27/23 0118 04/28/23 0040 04/29/23 0103 04/30/23 0037 05/01/23 0724  ALBUMIN 2.7* 2.6* 2.8* 3.1* 3.3*   No results for input(s): "LIPASE", "AMYLASE" in the last 168 hours.  No results for input(s): "AMMONIA" in the last 168 hours. Coagulation Profile: No results for input(s): "INR", "PROTIME" in the last 168 hours. CBC: Recent Labs  Lab 04/25/23 0712 04/25/23 1402 04/25/23 1404 04/26/23 0238 04/27/23 0118 04/28/23 0040 04/29/23 0103  WBC 4.4  --   --  4.6 5.5 5.4 5.2  HGB 9.0*   < > 8.8* 8.6* 8.5* 8.7* 8.7*  HCT 28.8*   < > 26.0* 27.0* 27.6* 28.2* 28.3*  MCV 86.7  --   --  86.5 87.6 88.1 87.9  PLT 151  --   --  161 173 179 182   < > = values in this interval not displayed.   Cardiac Enzymes: No results for input(s): "CKTOTAL", "CKMB", "CKMBINDEX", "TROPONINI" in the last 168 hours.  BNP: Invalid input(s): "POCBNP" CBG: Recent Labs  Lab 04/30/23 2106 05/01/23 0341 05/01/23 0806 05/01/23 1142 05/01/23 1535  GLUCAP 290* 187* 189* 260* 249*   HbA1C: No results for input(s): "HGBA1C" in the last 72 hours. Urine analysis:    Component Value Date/Time   COLORURINE YELLOW 04/21/2023 0726   APPEARANCEUR CLOUDY (A) 04/21/2023 0726   LABSPEC 1.006 04/21/2023 0726   PHURINE 5.0 04/21/2023 0726   GLUCOSEU NEGATIVE 04/21/2023 0726   HGBUR NEGATIVE 04/21/2023 0726   BILIRUBINUR NEGATIVE 04/21/2023 0726   KETONESUR NEGATIVE 04/21/2023 0726   PROTEINUR NEGATIVE 04/21/2023 0726   UROBILINOGEN 0.2 12/17/2011 2258   NITRITE NEGATIVE 04/21/2023 0726   LEUKOCYTESUR NEGATIVE 04/21/2023 0726   Sepsis Labs: @LABRCNTIP (procalcitonin:4,lacticidven:4) ) Recent Results (from the past 240 hour(s))  Respiratory (~20 pathogens) panel  by PCR     Status: None   Collection Time: 04/23/23  3:13 AM   Specimen: Nasopharyngeal Swab; Respiratory  Result Value Ref Range Status   Adenovirus NOT DETECTED NOT DETECTED Final   Coronavirus 229E NOT DETECTED NOT DETECTED Final    Comment: (NOTE) The Coronavirus on the Respiratory Panel, DOES NOT test for the novel  Coronavirus (2019 nCoV)    Coronavirus HKU1 NOT DETECTED NOT DETECTED Final   Coronavirus NL63 NOT DETECTED NOT DETECTED Final   Coronavirus OC43 NOT DETECTED NOT DETECTED Final   Metapneumovirus NOT DETECTED NOT DETECTED Final   Rhinovirus / Enterovirus NOT DETECTED NOT DETECTED Final   Influenza A NOT DETECTED NOT DETECTED Final   Influenza B NOT DETECTED NOT DETECTED Final   Parainfluenza Virus 1 NOT  DETECTED NOT DETECTED Final   Parainfluenza Virus 2 NOT DETECTED NOT DETECTED Final   Parainfluenza Virus 3 NOT DETECTED NOT DETECTED Final   Parainfluenza Virus 4 NOT DETECTED NOT DETECTED Final   Respiratory Syncytial Virus NOT DETECTED NOT DETECTED Final   Bordetella pertussis NOT DETECTED NOT DETECTED Final   Bordetella Parapertussis NOT DETECTED NOT DETECTED Final   Chlamydophila pneumoniae NOT DETECTED NOT DETECTED Final   Mycoplasma pneumoniae NOT DETECTED NOT DETECTED Final    Comment: Performed at [facility], [address], [place], [place] [address]  SARS Coronavirus 2 by RT PCR (hospital order, performed in [facility] hospital lab) *cepheid single result test* Anterior Nasal Swab     Status: None   Collection Time: 04/23/23  5:31 PM   Specimen: Anterior Nasal Swab  Result Value Ref Range Status   SARS Coronavirus 2 by RT PCR NEGATIVE NEGATIVE Final    Comment: (NOTE) SARS-CoV-2 target nucleic acids are NOT DETECTED.  The SARS-CoV-2 RNA is generally detectable in upper and lower respiratory specimens during the acute phase of infection. The lowest concentration of SARS-CoV-2 viral copies this assay can detect is 250 copies / mL. A negative  result does not preclude SARS-CoV-2 infection and should not be used as the sole basis for treatment or other patient management decisions.  A negative result may occur with improper specimen collection / handling, submission of specimen other than nasopharyngeal swab, presence of viral mutation(s) within the areas targeted by this assay, and inadequate number of viral copies (<250 copies / mL). A negative result must be combined with clinical observations, patient history, and epidemiological information.  Fact Sheet for Patients:   RoadLapTop.co.za  Fact Sheet for Healthcare Providers: http://kim-miller.com/  This test is not yet approved or  cleared by the [place] FDA and has been authorized for detection and/or diagnosis of SARS-CoV-2 by FDA under an Emergency Use Authorization (EUA).  This EUA will remain in effect (meaning this test can be used) for the duration of the COVID-19 declaration under Section 564(b)(1) of the Act, 21 U.S.C. section 360bbb-3(b)(1), unless the authorization is terminated or revoked sooner.  Performed at [facility], [address], [place], [place] [address]      Scheduled Meds:  amLODipine  5 mg Oral Daily   apixaban  5 mg Oral BID   atorvastatin  80 mg Oral q morning   carvedilol  12.5 mg Oral BID WC   clonazePAM  0.5 mg Oral TID   clopidogrel  75 mg Oral Daily   darbepoetin (ARANESP) injection - NON-DIALYSIS  60 mcg Subcutaneous Q Fri-1800   ezetimibe  10 mg Oral Daily   furosemide  40 mg Intravenous BID   gabapentin  300 mg Oral QHS   insulin aspart  0-15 Units Subcutaneous TID WC   insulin aspart  0-5 Units Subcutaneous QHS   insulin aspart  14 Units Subcutaneous TID WC   insulin detemir  50 Units Subcutaneous BID   isosorbide mononitrate  90 mg Oral q morning   lamoTRIgine  100 mg Oral BID   multivitamin with minerals  1 tablet Oral Daily   omega-3 acid ethyl esters  1 g Oral BID    polyethylene glycol  17 g Oral BID   senna-docusate  2 tablet Oral QHS   sertraline  100 mg Oral Daily   sodium chloride flush  3 mL Intravenous Q12H   Continuous Infusions:  sodium chloride Stopped (04/28/23 1915)    Procedures/Studies:  CT HEAD CODE STROKE WO CONTRAST  Result Date: 04/25/2023 CLINICAL DATA:  Code stroke.  TIA.  Acute visual changes. EXAM: CT HEAD WITHOUT CONTRAST TECHNIQUE: Contiguous axial images were obtained from the base of the skull through the vertex without intravenous contrast. RADIATION DOSE REDUCTION: This exam was performed according to the departmental dose-optimization program which includes automated exposure control, adjustment of the mA and/or kV according to patient size and/or use of iterative reconstruction technique. COMPARISON:  CT head without contrast 02/08/2023 FINDINGS: Brain: No acute infarct, hemorrhage, or mass lesion is present. The ventricles are of normal size. No significant extraaxial fluid collection is present. The brainstem and cerebellum are within normal limits. Midline structures are within normal limits. Vascular: Atherosclerotic calcifications are present within the cavernous internal carotid arteries. No hyperdense vessel is present. Skull: Calvarium is intact. No focal lytic or blastic lesions are present. No significant extracranial soft tissue lesion is present. Sinuses/Orbits: Polyps are again noted in the left maxillary sinus. Increased mucosal thickening is present. The globes and orbits are within normal limits. IMPRESSION: 1. Normal CT appearance of the brain. 2. Increased mucosal thickening in the left maxillary sinus. The above was relayed via text pager to Dr. Viviann Spare on 04/25/2023 at 18:56 . Electronically Signed   By: Marin Roberts M.D.   On: 04/25/2023 18:56   CARDIAC CATHETERIZATION  Result Date: 04/25/2023   Hemodynamic findings consistent with mild pulmonary hypertension. Mild elevation of right heart pressures   CT  RENAL STONE STUDY  Result Date: 04/22/2023 CLINICAL DATA:  Left flank pain, nausea EXAM: CT ABDOMEN AND PELVIS WITHOUT CONTRAST TECHNIQUE: Multidetector CT imaging of the abdomen and pelvis was performed following the standard protocol without IV contrast. RADIATION DOSE REDUCTION: This exam was performed according to the departmental dose-optimization program which includes automated exposure control, adjustment of the mA and/or kV according to patient size and/or use of iterative reconstruction technique. COMPARISON:  04/18/2023 FINDINGS: Lower chest: Interval development of bibasilar peribronchovascular ground-glass pulmonary infiltrates likely related to multifocal infection or noncardiogenic pulmonary edema. Trace bilateral pleural effusions. Extensive multi-vessel coronary artery calcification. Global cardiac size within normal limits. Hepatobiliary: No focal liver abnormality is seen. Status post cholecystectomy. No biliary dilatation. Pancreas: Unremarkable. No pancreatic ductal dilatation or surrounding inflammatory changes. Spleen: Mild splenomegaly is stable with the spleen measuring 15.6 cm in greatest dimension. This appears slightly progressive since remote prior examination 10/07/2022. No intrasplenic lesions identified on this noncontrast examination. Adrenals/Urinary Tract: The adrenal glands are unremarkable. The kidneys are normal in size and position. 1-2 mm punctate nonobstructing calculi are noted within the interpolar and lower polar regions of the kidneys bilaterally. No hydronephrosis. Minimal nonspecific bilateral perinephric stranding. No perinephric fluid collections. No hydronephrosis. No ureteral calculi. The bladder is unremarkable. Stomach/Bowel: Moderate colonic stool burden without evidence of obstruction. Status post subtotal appendectomy. The stomach, small bowel, and large bowel are otherwise unremarkable. No evidence of obstruction or focal inflammation. No free intraperitoneal  gas or fluid. Vascular/Lymphatic: Aortic atherosclerosis. No enlarged abdominal or pelvic lymph nodes. Reproductive: Status post hysterectomy. No adnexal masses. Other: No abdominal wall hernia. Musculoskeletal: Osseous structures are age-appropriate. No acute bone abnormality. No lytic or blastic bone lesion. IMPRESSION: 1. Interval development of bibasilar peribronchovascular ground-glass pulmonary infiltrates likely related to multifocal infection or noncardiogenic pulmonary edema. Trace bilateral pleural effusions. 2. Extensive multi-vessel coronary artery calcification. 3. Mild splenomegaly, progressive since remote prior examination of 10/07/2022. 4. Minimal bilateral nonobstructing nephrolithiasis. No urolithiasis. No hydronephrosis. 5. Moderate colonic stool burden  without evidence of obstruction. 6. Aortic atherosclerosis. Aortic Atherosclerosis (ICD10-I70.0). Electronically Signed   By: Helyn Numbers M.D.   On: 04/22/2023 19:45   ECHOCARDIOGRAM LIMITED  Result Date: 04/22/2023    ECHOCARDIOGRAM LIMITED REPORT   Patient Name:   [name] Date of Exam: 04/22/2023 Medical Rec #:  [id number]      Height:       63.0 in Accession #:    [id number]     Weight:       245.0 lb Date of Birth:  03/13/1961      BSA:          2.108 m Patient Age:    61 years       BP:           117/62 mmHg Patient Gender: F              HR:           67 bpm. [facility] Procedure: Limited Echo, Cardiac Doppler and Color Doppler Indications:    CHF                 IVC check  History:        Patient has prior history of Echocardiogram examinations, most                 recent 04/17/2023. CHF, CAD and Angina, Arrythmias:Atrial                 Fibrillation, Signs/Symptoms:Chest Pain; Risk                 Factors:Hypertension, Diabetes and Dyslipidemia. CKD.  Sonographer:    Mikki Harbor Referring Phys: [id number] VISHNU P MALLIPEDDI  Sonographer Comments: Patient is obese. IMPRESSIONS  1. Limited Echo for IVC check.  2.  Left ventricular ejection fraction, by estimation, is 60 to 65%. The left ventricle has normal function. Left ventricular diastolic function could not be evaluated.  3. Right ventricular systolic function is normal. The right ventricular size is normal. There is normal pulmonary artery systolic pressure.  4. The inferior vena cava is normal in size with <50% respiratory variability, suggesting right atrial pressure of 8 mmHg. FINDINGS  Left Ventricle: Left ventricular ejection fraction, by estimation, is 60 to 65%. The left ventricle has normal function. The left ventricular internal cavity size was normal in size. There is no left ventricular hypertrophy. Left ventricular diastolic function could not be evaluated. Right Ventricle: The right ventricular size is normal. Right ventricular systolic function is normal. There is normal pulmonary artery systolic pressure. The tricuspid regurgitant velocity is 2.52 m/s, and with an assumed right atrial pressure of 8 mmHg,  the estimated right ventricular systolic pressure is 33.4 mmHg. Left Atrium: Left atrial size was not assessed. Right Atrium: Right atrial size was not assessed. Pericardium: There is no evidence of pericardial effusion. Mitral Valve: The mitral valve is normal in structure. Tricuspid Valve: The tricuspid valve is normal in structure. Tricuspid valve regurgitation is trivial. Aortic Valve: The aortic valve is tricuspid. There is mild calcification of the aortic valve. There is mild thickening of the aortic valve. There is mild aortic valve annular calcification. Pulmonic Valve: The pulmonic valve was normal in structure. Aorta: The aortic root is normal in size and structure. Venous: The inferior vena cava is normal in size with less than 50% respiratory variability, suggesting right atrial pressure of 8 mmHg. IAS/Shunts: The interatrial septum was not assessed. LEFT VENTRICLE PLAX 2D  LVIDd:         4.70 cm LVIDs:         3.00 cm LV PW:         1.20 cm  LV IVS:        1.10 cm LVOT diam:     2.10 cm LVOT Area:     3.46 cm  LEFT ATRIUM         Index LA diam:    4.40 cm 2.09 cm/m   AORTA Ao Root diam: 3.10 cm TRICUSPID VALVE TR Peak grad:   25.4 mmHg TR Vmax:        252.00 cm/s  SHUNTS Systemic Diam: 2.10 cm Vishnu Priya Mallipeddi Electronically signed by Winfield Rast Mallipeddi Signature Date/Time: 04/22/2023/5:07:38 PM    Final    DG CHEST PORT 1 VIEW  Result Date: 04/21/2023 CLINICAL DATA:  Chest pain EXAM: PORTABLE CHEST 1 VIEW COMPARISON:  04/20/2023 FINDINGS: The heart size and mediastinal contours are within normal limits. Both lungs are clear. The visualized skeletal structures are unremarkable. IMPRESSION: No active disease. Electronically Signed   By: Charlett Nose M.D.   On: 04/21/2023 16:58   DG CHEST PORT 1 VIEW  Result Date: 04/20/2023 CLINICAL DATA:  [id number] Chest pain [id number] EXAM: PORTABLE CHEST - 1 VIEW COMPARISON:  04/15/2023 FINDINGS: Relatively low lung volumes with some crowding of bronchovascular structures centrally. No new infiltrate. No pneumothorax. Heart size and mediastinal contours are within normal limits. No effusion. Visualized bones unremarkable. IMPRESSION: Low lung volumes. No acute findings. Electronically Signed   By: Corlis Leak M.D.   On: 04/20/2023 10:03   US RENAL  Result Date: 04/19/2023 CLINICAL DATA:  Acute on chronic kidney injury EXAM: RENAL / URINARY TRACT ULTRASOUND COMPLETE COMPARISON:  CT abdomen pelvis 04/18/2023 FINDINGS: Right Kidney: Renal measurements: 9.4 x 4.9 x 4.4 cm = volume: 105 mL. Echogenicity within normal limits. No mass or hydronephrosis visualized. Left Kidney: Renal measurements: 11.6 x 5.6 x 5.0 cm = volume: 169 mL. Echogenicity within normal limits. No mass or hydronephrosis visualized. Bladder: Limited evaluation due to to collapsed configuration. Other: Diffuse increased echogenicity of the visualized portions of the hepatic parenchyma are a nonspecific indicator of hepatocellular  dysfunction, most commonly steatosis. IMPRESSION: No significant sonographic abnormality of the kidneys. Electronically Signed   By: Acquanetta Belling M.D.   On: 04/19/2023 10:55   CT ABDOMEN PELVIS WO CONTRAST  Result Date: 04/18/2023 CLINICAL DATA:  Right lower quadrant abdominal pain EXAM: CT ABDOMEN AND PELVIS WITHOUT CONTRAST TECHNIQUE: Multidetector CT imaging of the abdomen and pelvis was performed following the standard protocol without IV contrast. RADIATION DOSE REDUCTION: This exam was performed according to the departmental dose-optimization program which includes automated exposure control, adjustment of the mA and/or kV according to patient size and/or use of iterative reconstruction technique. COMPARISON:  None Available. FINDINGS: Lower chest: No acute findings. Coronary artery and aortic calcifications. Hepatobiliary: Prior cholecystectomy. Diffuse fatty infiltration of the liver. No focal hepatic abnormality. Mild hepatomegaly with craniocaudal length 23 cm. Pancreas: No focal abnormality or ductal dilatation. Spleen: Splenomegaly with a craniocaudal length of 14.5 cm. This is stable since prior study. No focal abnormality. Adrenals/Urinary Tract: Punctate bilateral nonobstructing renal calculi. No ureteral stones or hydronephrosis. Adrenal glands and urinary bladder unremarkable. Stomach/Bowel: Stomach, large and small bowel grossly unremarkable. Vascular/Lymphatic: Aortic atherosclerosis. No evidence of aneurysm or adenopathy. Reproductive: Prior hysterectomy.  No adnexal masses. Other: No free fluid or free air. Musculoskeletal: No acute bony abnormality. IMPRESSION: Hepatic  steatosis.  Hepatosplenomegaly. No acute findings in the abdomen or pelvis. Aortic atherosclerosis, coronary artery disease. Punctate bilateral nephrolithiasis.  No hydronephrosis. Electronically Signed   By: Charlett Nose M.D.   On: 04/18/2023 19:22   ECHOCARDIOGRAM COMPLETE  Result Date: 04/17/2023    ECHOCARDIOGRAM REPORT    Patient Name:   [name] Date of Exam: 04/17/2023 Medical Rec #:  [id number]      Height:       63.0 in Accession #:    [id number]     Weight:       245.0 lb Date of Birth:  [birth year]      BSA:          2.108 m Patient Age:    61 years       BP:           136/73 mmHg Patient Gender: F              HR:           71 bpm. [facility] Procedure: 2D Echo, Cardiac Doppler and Color Doppler Indications:    Chest Pain R07.9  History:        Patient has no prior history of Echocardiogram examinations.                 CAD, Arrythmias:Atrial Fibrillation, Signs/Symptoms:Chest Pain;                 Risk Factors:Dyslipidemia, Hypertension and Diabetes.  Sonographer:    Aron Baba Referring Phys: [id number] Ellsworth Lennox  Sonographer Comments: Patient is obese. IMPRESSIONS  1. Left ventricular ejection fraction, by estimation, is 55 to 60%. The left ventricle has normal function. The left ventricle has no regional wall motion abnormalities. Left ventricular diastolic parameters are consistent with Grade I diastolic dysfunction (impaired relaxation).  2. Right ventricular systolic function is normal. The right ventricular size is normal. Tricuspid regurgitation signal is inadequate for assessing PA pressure.  3. The mitral valve is abnormal. Mild mitral valve regurgitation. No evidence of mitral stenosis.  4. The aortic valve is tricuspid. Aortic valve regurgitation is not visualized. No aortic stenosis is present.  5. Aortic dilatation noted. 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  6. The inferior vena cava is normal in size with greater than 50% respiratory variability, suggesting right atrial pressure of 3 mmHg. FINDINGS  Left Ventricle: Left ventricular ejection fraction, by estimation, is 55 to 60%. The left ventricle has normal function. The left ventricle has no regional wall motion abnormalities. The left ventricular internal cavity size was normal in size. There is  no  left ventricular hypertrophy. Left ventricular diastolic parameters are consistent with Grade I diastolic dysfunction (impaired relaxation). Normal left ventricular filling pressure. Right Ventricle: The right ventricular size is normal. Right vetricular wall thickness was not well visualized. Right ventricular systolic function is normal. Tricuspid regurgitation signal is inadequate for assessing PA pressure. Left Atrium: Left atrial size was normal in size. Right Atrium: Right atrial size was normal in size. Pericardium: There is no evidence of pericardial effusion. Mitral Valve: The mitral valve is abnormal. Mild mitral valve regurgitation. No evidence of mitral valve stenosis. Tricuspid Valve: The tricuspid valve is normal in structure. Tricuspid valve regurgitation is trivial. No evidence of tricuspid stenosis. Aortic Valve: The aortic valve is tricuspid. Aortic valve regurgitation is not visualized. No aortic stenosis is present. Aortic valve mean gradient measures 5.4 mmHg. Aortic valve peak gradient  measures 8.7 mmHg. Aortic valve area, by VTI measures 1.85 cm. Pulmonic Valve: The pulmonic valve was not well visualized. Pulmonic valve regurgitation is not visualized. No evidence of pulmonic stenosis. Aorta: The aortic root is normal in size and structure and aortic dilatation noted. 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 Venous: The inferior vena cava is normal in size with greater than 50% respiratory variability, suggesting right atrial pressure of 3 mmHg. IAS/Shunts: No atrial level shunt detected by color flow Doppler.  LEFT VENTRICLE PLAX 2D LVIDd:         5.00 cm   Diastology LVIDs:         3.60 cm   LV e' medial:    13.20 cm/s LV PW:         0.90 cm   LV E/e' medial:  5.8 LV IVS:        0.80 cm   LV e' lateral:   6.22 cm/s LVOT diam:     2.00 cm   LV E/e' lateral: 12.3 LV SV:         67 LV SV Index:   32 LVOT Area:     3.14 cm  RIGHT VENTRICLE RV S prime:     12.60 cm/s  TAPSE (M-mode): 2.2 cm LEFT ATRIUM             Index        RIGHT ATRIUM           Index LA diam:        4.00 cm 1.90 cm/m   RA Area:     10.10 cm LA Vol (A2C):   69.3 ml 32.88 ml/m  RA Volume:   20.40 ml  9.68 ml/m LA Vol (A4C):   65.9 ml 31.26 ml/m LA Biplane Vol: 70.8 ml 33.59 ml/m  AORTIC VALVE AV Area (Vmax):    1.94 cm AV Area (Vmean):   1.69 cm AV Area (VTI):     1.85 cm AV Vmax:           147.81 cm/s AV Vmean:          109.081 cm/s AV VTI:            0.360 m AV Peak Grad:      8.7 mmHg AV Mean Grad:      5.4 mmHg LVOT Vmax:         91.34 cm/s LVOT Vmean:        58.622 cm/s LVOT VTI:          0.212 m LVOT/AV VTI ratio: 0.59  AORTA Ao Root diam: 3.40 cm Ao Asc diam:  3.60 cm MITRAL VALVE MV Area (PHT): 4.71 cm     SHUNTS MV Decel Time: 161 msec     Systemic VTI:  0.21 m MR Peak grad: 82.2 mmHg     Systemic Diam: 2.00 cm MR Vmax:      453.33 cm/s MV E velocity: 76.60 cm/s MV A velocity: 108.00 cm/s MV E/A ratio:  0.71 Dina Rich MD Electronically signed by Dina Rich MD Signature Date/Time: 04/17/2023/1:33:26 PM    Final    CT CHEST ABDOMEN PELVIS WO CONTRAST  Result Date: 04/16/2023 CLINICAL DATA:  Sharp chest pain radiating to back. Left-sided rib pain and right-sided abdominal pain. EXAM: CT CHEST, ABDOMEN AND PELVIS WITHOUT CONTRAST TECHNIQUE: Multidetector CT imaging of the chest, abdomen and pelvis was performed following the standard protocol without IV contrast. RADIATION DOSE REDUCTION: This exam was  performed according to the departmental dose-optimization program which includes automated exposure control, adjustment of the mA and/or kV according to patient size and/or use of iterative reconstruction technique. COMPARISON:  10/07/2022. FINDINGS: CT CHEST FINDINGS Cardiovascular: The heart is normal in size and there is no pericardial effusion. Three-vessel coronary artery calcifications are noted. There is atherosclerotic calcification of the aorta without evidence of aneurysm.  The pulmonary trunk is normal in caliber. Mediastinum/Nodes: No mediastinal or hilar lymphadenopathy. Evaluation of the hila is limited due to lack of IV contrast. Coarse calcification is noted in the left lobe of the thyroid gland. The trachea and esophagus are within normal limits. Lungs/Pleura: Mild atelectasis is present bilaterally. No effusion or pneumothorax. Small calcified granuloma are present bilaterally. Musculoskeletal: Degenerative changes are present in the thoracic spine. No acute fracture is seen. CT ABDOMEN PELVIS FINDINGS Hepatobiliary: No focal liver abnormality is seen. The liver is mildly enlarged. No gallstones, gallbladder wall thickening, or biliary dilatation. Pancreas: Unremarkable. No pancreatic ductal dilatation or surrounding inflammatory changes. Spleen: The spleen is enlarged at 14.1 cm in length. No focal abnormality. Adrenals/Urinary Tract: The adrenal glands are within normal limits. A nonobstructive renal calculus is noted on the right. No hydroureteronephrosis bilaterally. The bladder is unremarkable. Stomach/Bowel: Stomach is within normal limits. Appendix is surgically absent. No evidence of bowel wall thickening, distention, or inflammatory changes. No free air or pneumatosis. Vascular/Lymphatic: Aortic atherosclerosis. No enlarged abdominal or pelvic lymph nodes. Reproductive: Status post hysterectomy. No adnexal masses. Other: No abdominopelvic ascites. Subcutaneous fat stranding is noted in the anterior abdominal wall in the left lower quadrant, unchanged from the prior exam. Musculoskeletal: Degenerative changes are present in the lumbar spine. No acute fracture. IMPRESSION: 1. No acute process in the chest, abdomen, or pelvis. 2. Nonobstructive right renal calculus. 3. Mild hepatosplenomegaly. 4. Subcutaneous fat stranding in the anterior abdominal wall in the left lower quadrant, unchanged from the prior exam, possible cellulitis. 5. Coronary artery calcifications. 6.  Aortic atherosclerosis. Electronically Signed   By: Thornell Sartorius M.D.   On: 04/16/2023 00:19   DG Chest 2 View  Result Date: 04/15/2023 CLINICAL DATA:  Left-sided chest pain for 2 days EXAM: CHEST - 2 VIEW COMPARISON:  03/30/2023 FINDINGS: Frontal and lateral views of the chest demonstrate an unremarkable cardiac silhouette. No airspace disease, effusion, or pneumothorax. No acute bony abnormality. IMPRESSION: 1. No acute intrathoracic process. Electronically Signed   By: Sharlet Salina M.D.   On: 04/15/2023 22:33    Barnetta Chapel, MD  [facility]  If 7PM-7AM, please contact night-coverage [email or web address] Password [email or web address] 05/01/2023, 3:59 PM   LOS: 14 days

## 2023-05-01 NOTE — Progress Notes (Addendum)
Orthostatic BPs Sitting 147/89 (104)  Standing 109/84 (94)   Patient c/o dizziness and weakness with ambulation; BP then checked sitting and standing. Pt reports ongoing issue and sometimes feels like she will pass out, but she has not told anyone about these symptoms while admitted. Attending MD notified of orthostatic hypotension.  Ina Homes, PT, DPT Acute Rehabilitation Services  Personal: [facility] Office: [phone]

## 2023-05-02 DIAGNOSIS — R0789 Other chest pain: Secondary | ICD-10-CM | POA: Diagnosis not present

## 2023-05-02 DIAGNOSIS — I2510 Atherosclerotic heart disease of native coronary artery without angina pectoris: Secondary | ICD-10-CM | POA: Diagnosis not present

## 2023-05-02 DIAGNOSIS — I951 Orthostatic hypotension: Secondary | ICD-10-CM

## 2023-05-02 DIAGNOSIS — E871 Hypo-osmolality and hyponatremia: Secondary | ICD-10-CM

## 2023-05-02 DIAGNOSIS — I10 Essential (primary) hypertension: Secondary | ICD-10-CM | POA: Diagnosis not present

## 2023-05-02 DIAGNOSIS — N179 Acute kidney failure, unspecified: Secondary | ICD-10-CM | POA: Diagnosis not present

## 2023-05-02 LAB — GLUCOSE, CAPILLARY
Glucose-Capillary: 261 mg/dL — ABNORMAL HIGH (ref 70–99)
Glucose-Capillary: 223 mg/dL — ABNORMAL HIGH (ref 70–99)
Glucose-Capillary: 163 mg/dL — ABNORMAL HIGH (ref 70–99)
Glucose-Capillary: 200 mg/dL — ABNORMAL HIGH (ref 70–99)

## 2023-05-02 LAB — RENAL FUNCTION PANEL
Albumin: 3.1 g/dL — ABNORMAL LOW (ref 3.5–5.0)
Anion gap: 14 (ref 5–15)
BUN: 42 mg/dL — ABNORMAL HIGH (ref 6–20)
CO2: 25 mmol/L (ref 22–32)
Calcium: 8.4 mg/dL — ABNORMAL LOW (ref 8.9–10.3)
Chloride: 90 mmol/L — ABNORMAL LOW (ref 98–111)
Creatinine, Ser: 2.42 mg/dL — ABNORMAL HIGH (ref 0.44–1.00)
GFR, Estimated: 22 mL/min — ABNORMAL LOW (ref >60)
Glucose, Bld: 264 mg/dL — ABNORMAL HIGH (ref 70–99)
Phosphorus: 3.9 mg/dL (ref 2.5–4.6)
Potassium: 3.5 mmol/L (ref 3.5–5.1)
Sodium: 129 mmol/L — ABNORMAL LOW (ref 135–145)

## 2023-05-02 MED ORDER — ISOSORBIDE MONONITRATE ER 60 MG PO TB24
60.0000 mg | ORAL_TABLET | Freq: Every morning | ORAL | Status: DC
  Administered 2023-05-03: 60 mg via ORAL
  Filled 2023-05-02: qty 1

## 2023-05-02 MED ORDER — AMLODIPINE BESYLATE 2.5 MG PO TABS
2.5000 mg | ORAL_TABLET | Freq: Every day | ORAL | Status: DC
  Administered 2023-05-03: 2.5 mg via ORAL
  Filled 2023-05-02: qty 1

## 2023-05-02 NOTE — Progress Notes (Addendum)
Rounding Note    Patient Name: [name] Date of Encounter: 05/02/2023  [facility] Cardiologist: Marjo Bicker, MD   Subjective   CP last night that was different than her CP at presentation. She feels poorly with nausea and anorexia.  Inpatient Medications    Scheduled Meds:  amLODipine  5 mg Oral Daily   apixaban  5 mg Oral BID   atorvastatin  80 mg Oral q morning   carvedilol  12.5 mg Oral BID WC   clonazePAM  0.5 mg Oral TID   clopidogrel  75 mg Oral Daily   darbepoetin (ARANESP) injection - NON-DIALYSIS  60 mcg Subcutaneous Q Fri-1800   ezetimibe  10 mg Oral Daily   furosemide  40 mg Intravenous BID   gabapentin  300 mg Oral QHS   insulin aspart  0-15 Units Subcutaneous TID WC   insulin aspart  0-5 Units Subcutaneous QHS   insulin aspart  14 Units Subcutaneous TID WC   insulin detemir  50 Units Subcutaneous BID   isosorbide mononitrate  90 mg Oral q morning   lamoTRIgine  100 mg Oral BID   multivitamin with minerals  1 tablet Oral Daily   omega-3 acid ethyl esters  1 g Oral BID   polyethylene glycol  17 g Oral BID   senna-docusate  2 tablet Oral QHS   sertraline  100 mg Oral Daily   sodium chloride flush  3 mL Intravenous Q12H   Continuous Infusions:  sodium chloride Stopped (04/28/23 1915)   PRN Meds: sodium chloride, acetaminophen **OR** acetaminophen, alum & mag hydroxide-simeth, bisacodyl, diphenhydrAMINE, fentaNYL (SUBLIMAZE) injection, nitroGLYCERIN, ondansetron **OR** ondansetron (ZOFRAN) IV, mouth rinse, oxyCODONE, prochlorperazine, sodium chloride flush   Vital Signs    Vitals:   05/01/23 2056 05/02/23 0022 05/02/23 0244 05/02/23 0918  BP: (!) 127/51 (!) 152/70 118/74 (!) 148/73  Pulse:  67  70  Resp: 18  20 18   Temp: 98.2 F (36.8 C)  98.5 F (36.9 C)   TempSrc: Oral  Oral   SpO2:  97%    Weight:   110 kg   Height:        Intake/Output Summary (Last 24 hours) at 05/02/2023 1024 Last data filed at 05/01/2023  2202 Gross per 24 hour  Intake 243 ml  Output --  Net 243 ml      05/02/2023    2:44 AM 05/01/2023    3:46 AM 04/30/2023    6:01 AM  Last 3 Weights  Weight (lbs) 242 lb 8 oz 242 lb 4.8 oz 245 lb  Weight (kg) 109.997 kg 109.907 kg 111.131 kg      Telemetry    Sinus rhythm in the 70s - Personally Reviewed  ECG    No new tracings - Personally Reviewed  Physical Exam   GEN: No acute distress.   Neck: No JVD Cardiac: RRR, no murmurs, rubs, or gallops.  Respiratory: Clear to auscultation bilaterally. GI: Soft, nontender, non-distended  MS: No edema; No deformity. Neuro:  Nonfocal  Psych: Normal affect   Labs    High Sensitivity Troponin:   Recent Labs  Lab 04/22/23 0432 04/24/23 1221 04/24/23 1436 04/27/23 1608 04/27/23 1817  TROPONINIHS 4 4 4 5 5      Chemistry Recent Labs  Lab 04/30/23 0037 05/01/23 0724 05/02/23 0327  NA 133* 133* 129*  K 4.0 3.5 3.5  CL 94* 91* 90*  CO2 27 28 25   GLUCOSE 256* 179* 264*  BUN 37* 37*  42*  CREATININE 2.49* 2.65* 2.42*  CALCIUM 8.6* 8.5* 8.4*  ALBUMIN 3.1* 3.3* 3.1*  GFRNONAA 22* 20* 22*  ANIONGAP 12 14 14     Lipids No results for input(s): "CHOL", "TRIG", "HDL", "LABVLDL", "LDLCALC", "CHOLHDL" in the last 168 hours.  Hematology Recent Labs  Lab 04/27/23 0118 04/28/23 0040 04/29/23 0103  WBC 5.5 5.4 5.2  RBC 3.15* 3.20* 3.22*  HGB 8.5* 8.7* 8.7*  HCT 27.6* 28.2* 28.3*  MCV 87.6 88.1 87.9  MCH 27.0 27.2 27.0  MCHC 30.8 30.9 30.7  RDW 17.9* 18.1* 18.1*  PLT 173 179 182   Thyroid No results for input(s): "TSH", "FREET4" in the last 168 hours.  BNPNo results for input(s): "BNP", "PROBNP" in the last 168 hours.  DDimer No results for input(s): "DDIMER" in the last 168 hours.   Radiology    No results found.  Cardiac Studies   Echo 04/17/23 IMPRESSIONS    1. Left ventricular ejection fraction, by estimation, is 55 to 60%. The  left ventricle has normal function. The left ventricle has no regional  wall  motion abnormalities. Left ventricular diastolic parameters are  consistent with Grade I diastolic  dysfunction (impaired relaxation).   2. Right ventricular systolic function is normal. The right ventricular  size is normal. Tricuspid regurgitation signal is inadequate for assessing  PA pressure.   3. The mitral valve is abnormal. Mild mitral valve regurgitation. No  evidence of mitral stenosis.   4. The aortic valve is tricuspid. Aortic valve regurgitation is not  visualized. No aortic stenosis is present.   5. Aortic dilatation noted. There is mild dilatation of the ascending  aorta, measuring 36 mm.   6. The inferior vena cava is normal in size with greater than 50%  respiratory variability, suggesting right atrial pressure of 3 mmHg.    Limited Echo 04/22/23 IMPRESSIONS    1. Limited Echo for IVC check.   2. Left ventricular ejection fraction, by estimation, is 60 to 65%. The  left ventricle has normal function. Left ventricular diastolic function  could not be evaluated.   3. Right ventricular systolic function is normal. The right ventricular  size is normal. There is normal pulmonary artery systolic pressure.   4. The inferior vena cava is normal in size with <50% respiratory  variability, suggesting right atrial pressure of 8 mmHg.    RHC 04/25/23   Hemodynamic findings consistent with mild pulmonary hypertension.   Mild elevation of right heart pressures  Patient Profile     62 y.o. female with a history of CKD, CAD with PCI 2022, PAF on eliquis, HLD, DM2 with hyperglycemia, and anemia presented to [facility] with chest pain.  Had chest pain last night, is also dizzy with standing, question orthostatic hypotension  Assessment & Plan    Chest pain CAD s/p DES-RCA (2022 in [place]) - pt presented with chest pain but ruled out with negative CE and nonischemic EKG - ischemic evaluation complicated by CKD - medical therapy recommende - continue coreg 12.5 mg BID, amlodipine 5 m,  imdur 90 mg - plavix and eliquis, no ASA - she had chest pain last night, but also symptoms concerning for orthostatic hypotension - consider ranexa 500 mg BID   HFpEF Mild pulmonary hypertension - echo with preserved EF - RHC as above - defer diuretic regimen to nephrology - holding lasix now since she is not able to eat or drink, nausea   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 - sCr 2.42 - appreciate nephrology  PAF Chronic anticoagulation Continue coreg and resume eliquis   Hyperlipidemia with LDL goal < 70 04/17/2023: Cholesterol 204; HDL 26; LDL Cholesterol UNABLE TO CALCULATE IF TRIGLYCERIDE OVER 400 mg/dL; Triglycerides 448; VLDL UNABLE TO CALCULATE IF TRIGLYCERIDE OVER 400 mg/dL - continue 80 mg lipitor and zetia - taking lovaza - once she gets better insurance, would transition to vascepa       For questions or updates, please contact  [facility] Please consult [email or web address] for contact info under        Signed, Marcelino Duster, PA  05/02/2023, 10:24 AM     Patient seen and examined. Agree with assessment and plan.  Continues to have intermittent atypical chest pain.  Blood pressure evaluation today with orthostatics show supine blood pressure 121/62; sitting blood pressure 106/71; standing blood pressure 87/48; and blood pressure standing after 3 minutes and 82/61.  Will decrease isosorbide from 90 mg down to 60 mg and decrease amlodipine to 2.5 mg.  If patient is to start future Ranexa it would use low-dose with renal insufficiency.  Cr today continues to be elevated at 2.42.  Sodium is decreased at 129 potassium 3.5.  Albumin 3.1.  Hold Lasix.  plan medical therapy.   Lennette Bihari, MD, [facility] 05/02/2023 11:47 AM

## 2023-05-02 NOTE — Plan of Care (Signed)

## 2023-05-02 NOTE — Plan of Care (Signed)
  Problem: Fluid Volume: Goal: Ability to maintain a balanced intake and output will improve Outcome: Progressing   Problem: Nutritional: Goal: Maintenance of adequate nutrition will improve Outcome: Progressing   Problem: Skin Integrity: Goal: Risk for impaired skin integrity will decrease Outcome: Progressing   Problem: Tissue Perfusion: Goal: Adequacy of tissue perfusion will improve Outcome: Progressing   Problem: Clinical Measurements: Goal: Ability to maintain clinical measurements within normal limits will improve Outcome: Progressing

## 2023-05-02 NOTE — Progress Notes (Signed)
PROGRESS NOTE  [name] ZOX:[id number] DOB: [birth year] DOA: 04/15/2023 PCP: Patient, No Pcp Per  Brief History:  62 y.o. female with medical history significant of CAD s/p PCI, hypertension, hyperlipidemia, T2DM, DVT on Eliquis who presents to the emergency department with complaints of chest pain.  Patient complained of left-sided chest pain which occurred yesterday while sitting in a car chest pain was described as sharp and nonreproducible and was rated as 8/10 on pain scale with radiation to the back, this was associated with dizziness and nausea without vomiting.  She states that she called her PCP who advised her to go to the ED for further evaluation and management.  Patient denies shortness of breath, diaphoresis, fever.  The patient's troponins have been unremarkable.  Echocardiogram on 04/22/2023 showed EF 60 to 65%, trivial MR, normal RV EF.  However, patient continued to have intermittent chest pain throughout her hospitalization.  Her hospitalization was prolonged due to development of acute on chronic renal failure with serum creatinine up to 2.56.  Nephrology was consulted.  The patient was started on IV heparin for her persistent chest pain.  She received IV heparin for about a week and it was ultimately discontinued on 04/24/2023.  After discussion with nephrology, it was felt that the patient likely may have new renal baseline.  After discussion with the patient of the risk, benefits, and alternatives, all parties involved agreed to pursue left heart catheterization, however, cardiology team decided to hold cardiac catheterization..   05/01/2023: Patient seen.  No chest pain.  Serum creatinine is improving.  Nephrology input is appreciated.  However, patient reports that she feels unwell.  Poor appetite and p.o. intake.  Continue to monitor for now.  Cardiology input is appreciated.  Assessment/Plan: Chest pain with high risk for cardiac etiology/unstable angina - appreciate  cardiology team consultation - continue supportive measures, IV heparin infusion and high dose statin  - working to improve creatinine in case cath is approved - cardio team planning to premedicate for cath given history of contrast allergy -has some atypical features--requiring fentanyl IV pushes for pain -Patient has received 8 days of intravenous heparin.  This was discontinued on 04/24/2023. -04/22/2023 echo EF 60 to 65%, normal RV EF, trivial TR. -After discussion with nephrology and the patient, it was decided to go forward with left heart catheterization.  Cardiology team decided not to proceed with cardiac catheterization. 05/02/2023: No chest pain.  Patient will follow-up with cardiology team on discharge.  AKI on CKD stage 3b  -Baseline creatinine 1.2-1.4 -Serum creatinine today is 2.78.   - renal US done 8/3 with normal findings reported  - urinalysis reassuring -Nephrology is directing care.   - CT stone study--negative hydronephrosis, bibasilar GGO--infx vs edema; trace bilateral pleural effusion, moderate stool burden 05/01/2023: Mild worsening of renal function.  Serum creatinine is 2.65. 05/02/2023: Improvement in renal function noted today.  Serum creatinine is 2.42.  Continue to monitor for now.  pulmonary infiltrates -viral respiratory panel--negative -check COVID negative -check PCT < 0.10   Anemia in CKD  - Hg down to 7.7 with hydration - with her CAD, cardiology team want her Hg>8 - transfused 1 unit PRBC on 8/5 - Hg up to 9 after transfusion - follow   - hemoccult stools 05/01/2023: Last hemoglobin was 8.7 g/dL.   RLQ abdominal pain - intermittent, lipase test came back normal - pt reports appendectomy and cholecystectomy - CT abd/pelvis without contrast ordered (  due to contrast allergy and upcoming cath) - 8/2 CT AP with findings of hepatomegaly and steatosis of liver, no acute findings to explain abd pain - 8/6 CT renal protocol--negative hydronephrosis,  bibasilar GGO--infx vs edema; trace bilateral pleural effusion, moderate stool burden  CAD -continue ASA/plavix, statin -s/p RCA PCI in [place] 2022 04/27/2023: Cardiology team is directing care.  Possible cardiac catheterization in the morning.   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 - increased prandial novolog to 14 units TID with meals - continue home detemir 50 units BID  - 04/15/23 A1C--9.1    Chronic atrial fibrillation - carvedilol for heart rate control - holding apixaban in anticipation for possible cath later this week  - IV heparin for anticoagulation>>stopped 8/8   Hyperlipidemia - atorvastatin 80 mg daily    Essential Hypertension  - resumed home carvedilol with good BP control    Morbid Obesity -BMI 45.26 -lifestyle modification       Family Communication:   no Family at bedside   Consultants:  renal, cards   Code Status:  FULL   DVT Prophylaxis:  IV Heparin     Procedures: As Listed in Progress Note Above   Antibiotics: None   Subjective: No chest pain. Reports feeling unwell. Poor appetite.  Objective: Vitals:   05/02/23 0022 05/02/23 0244 05/02/23 0918 05/02/23 1138  BP: (!) 152/70 118/74 (!) 148/73 (!) 120/54  Pulse: 67  70 60  Resp:  20 18 16   Temp:  98.5 F (36.9 C)  (!) 97.5 F (36.4 C)  TempSrc:  Oral  Oral  SpO2: 97%     Weight:  110 kg    Height:        Intake/Output Summary (Last 24 hours) at 05/02/2023 2014 Last data filed at 05/01/2023 2202 Gross per 24 hour  Intake 243 ml  Output --  Net 243 ml   Weight change: 0.091 kg Exam:  General:  Pt is morbidly obese.  Not in any distress.  Awake and alert.   HEENT: Patient is pale.  No jaundice.   Cardiovascular: S1-S2. Respiratory: Clear to auscultation. Abdomen: Obese, soft and  nontender. Neuro: Awake and alert.  Moves all extremities.    Extremities: Edema is improving.  Data Reviewed: I have personally reviewed following labs and imaging studies Basic Metabolic Panel: Recent Labs  Lab 04/28/23 0040 04/29/23 0103 04/30/23 0037 05/01/23 0724 05/02/23 0327  NA 132* 135 133* 133* 129*  K 3.8 3.9 4.0 3.5 3.5  CL 94* 98 94* 91* 90*  CO2 27 26 27 28 25   GLUCOSE 187* 129* 256* 179* 264*  BUN 36* 34* 37* 37* 42*  CREATININE 2.78* 2.76* 2.49* 2.65* 2.42*  CALCIUM 8.1* 8.1* 8.6* 8.5* 8.4*  PHOS 4.5 3.8 4.0 4.7* 3.9   Liver Function Tests: Recent Labs  Lab 04/28/23 0040 04/29/23 0103 04/30/23 0037 05/01/23 0724 05/02/23 0327  ALBUMIN 2.6* 2.8* 3.1* 3.3* 3.1*   No results for input(s): "LIPASE", "AMYLASE" in the last 168 hours.  No results for input(s): "AMMONIA" in the last 168 hours. Coagulation Profile: No results for input(s): "INR", "PROTIME" in the last 168 hours. CBC: Recent Labs  Lab 04/26/23 0238 04/27/23 0118 04/28/23 0040 04/29/23 0103  WBC 4.6 5.5 5.4 5.2  HGB 8.6* 8.5* 8.7* 8.7*  HCT 27.0* 27.6* 28.2*  28.3*  MCV 86.5 87.6 88.1 87.9  PLT 161 173 179 182   Cardiac Enzymes: No results for input(s): "CKTOTAL", "CKMB", "CKMBINDEX", "TROPONINI" in the last 168 hours.  BNP: Invalid input(s): "POCBNP" CBG: Recent Labs  Lab 05/01/23 2054 05/02/23 0336 05/02/23 0752 05/02/23 1140 05/02/23 1534  GLUCAP 310* 261* 223* 163* 200*   HbA1C: No results for input(s): "HGBA1C" in the last 72 hours. Urine analysis:    Component Value Date/Time   COLORURINE YELLOW 04/21/2023 0726   APPEARANCEUR CLOUDY (A) 04/21/2023 0726   LABSPEC 1.006 04/21/2023 0726   PHURINE 5.0 04/21/2023 0726   GLUCOSEU NEGATIVE 04/21/2023 0726   HGBUR NEGATIVE 04/21/2023 0726   BILIRUBINUR NEGATIVE 04/21/2023 0726   KETONESUR NEGATIVE 04/21/2023 0726   PROTEINUR NEGATIVE 04/21/2023 0726   UROBILINOGEN 0.2 12/17/2011 2258   NITRITE NEGATIVE 04/21/2023  0726   LEUKOCYTESUR NEGATIVE 04/21/2023 0726   Sepsis Labs: @LABRCNTIP (procalcitonin:4,lacticidven:4) ) Recent Results (from the past 240 hour(s))  Respiratory (~20 pathogens) panel by PCR     Status: None   Collection Time: 04/23/23  3:13 AM   Specimen: Nasopharyngeal Swab; Respiratory  Result Value Ref Range Status   Adenovirus NOT DETECTED NOT DETECTED Final   Coronavirus 229E NOT DETECTED NOT DETECTED Final    Comment: (NOTE) The Coronavirus on the Respiratory Panel, DOES NOT test for the novel  Coronavirus (2019 nCoV)    Coronavirus HKU1 NOT DETECTED NOT DETECTED Final   Coronavirus NL63 NOT DETECTED NOT DETECTED Final   Coronavirus OC43 NOT DETECTED NOT DETECTED Final   Metapneumovirus NOT DETECTED NOT DETECTED Final   Rhinovirus / Enterovirus NOT DETECTED NOT DETECTED Final   Influenza A NOT DETECTED NOT DETECTED Final   Influenza B NOT DETECTED NOT DETECTED Final   Parainfluenza Virus 1 NOT DETECTED NOT DETECTED Final   Parainfluenza Virus 2 NOT DETECTED NOT DETECTED Final   Parainfluenza Virus 3 NOT DETECTED NOT DETECTED Final   Parainfluenza Virus 4 NOT DETECTED NOT DETECTED Final   Respiratory Syncytial Virus NOT DETECTED NOT DETECTED Final   Bordetella pertussis NOT DETECTED NOT DETECTED Final   Bordetella Parapertussis NOT DETECTED NOT DETECTED Final   Chlamydophila pneumoniae NOT DETECTED NOT DETECTED Final   Mycoplasma pneumoniae NOT DETECTED NOT DETECTED Final    Comment: Performed at [facility], [address] [place], [place] [address]  SARS Coronavirus 2 by RT PCR (hospital order, performed in [facility] hospital lab) *cepheid single result test* Anterior Nasal Swab     Status: None   Collection Time: 04/23/23  5:31 PM   Specimen: Anterior Nasal Swab  Result Value Ref Range Status   SARS Coronavirus 2 by RT PCR NEGATIVE NEGATIVE Final    Comment: (NOTE) SARS-CoV-2 target nucleic acids are NOT DETECTED.  The SARS-CoV-2 RNA is generally detectable  in upper and lower respiratory specimens during the acute phase of infection. The lowest concentration of SARS-CoV-2 viral copies this assay can detect is 250 copies / mL. A negative result does not preclude SARS-CoV-2 infection and should not be used as the sole basis for treatment or other patient management decisions.  A negative result may occur with improper specimen collection / handling, submission of specimen other than nasopharyngeal swab, presence of viral mutation(s) within the areas targeted by this assay, and inadequate number of viral copies (<250 copies / mL). A negative result must be combined with clinical observations, patient history, and epidemiological information.  Fact Sheet for Patients:   RoadLapTop.co.za  Fact Sheet for  Healthcare Providers: http://kim-miller.com/  This test is not yet approved or  cleared by the [place] and has been authorized for detection and/or diagnosis of SARS-CoV-2 by FDA under an Emergency Use Authorization (EUA).  This EUA will remain in effect (meaning this test can be used) for the duration of the COVID-19 declaration under Section 564(b)(1) of the Act, 21 U.S.C. section 360bbb-3(b)(1), unless the authorization is terminated or revoked sooner.  Performed at [facility], [address] [place], [place] [address]      Scheduled Meds:  [START ON 05/03/2023] amLODipine  2.5 mg Oral Daily   apixaban  5 mg Oral BID   atorvastatin  80 mg Oral q morning   carvedilol  12.5 mg Oral BID WC   clonazePAM  0.5 mg Oral TID   clopidogrel  75 mg Oral Daily   darbepoetin (ARANESP) injection - NON-DIALYSIS  60 mcg Subcutaneous Q Fri-1800   ezetimibe  10 mg Oral Daily   gabapentin  300 mg Oral QHS   insulin aspart  0-15 Units Subcutaneous TID WC   insulin aspart  0-5 Units Subcutaneous QHS   insulin aspart  14 Units Subcutaneous TID WC   insulin detemir  50 Units Subcutaneous BID    [START ON 05/03/2023] isosorbide mononitrate  60 mg Oral q morning   lamoTRIgine  100 mg Oral BID   multivitamin with minerals  1 tablet Oral Daily   omega-3 acid ethyl esters  1 g Oral BID   polyethylene glycol  17 g Oral BID   senna-docusate  2 tablet Oral QHS   sertraline  100 mg Oral Daily   sodium chloride flush  3 mL Intravenous Q12H   Continuous Infusions:  sodium chloride Stopped (04/28/23 1915)    Procedures/Studies: CT HEAD CODE STROKE WO CONTRAST  Result Date: 04/25/2023 CLINICAL DATA:  Code stroke.  TIA.  Acute visual changes. EXAM: CT HEAD WITHOUT CONTRAST TECHNIQUE: Contiguous axial images were obtained from the base of the skull through the vertex without intravenous contrast. RADIATION DOSE REDUCTION: This exam was performed according to the departmental dose-optimization program which includes automated exposure control, adjustment of the mA and/or kV according to patient size and/or use of iterative reconstruction technique. COMPARISON:  CT head without contrast 02/08/2023 FINDINGS: Brain: No acute infarct, hemorrhage, or mass lesion is present. The ventricles are of normal size. No significant extraaxial fluid collection is present. The brainstem and cerebellum are within normal limits. Midline structures are within normal limits. Vascular: Atherosclerotic calcifications are present within the cavernous internal carotid arteries. No hyperdense vessel is present. Skull: Calvarium is intact. No focal lytic or blastic lesions are present. No significant extracranial soft tissue lesion is present. Sinuses/Orbits: Polyps are again noted in the left maxillary sinus. Increased mucosal thickening is present. The globes and orbits are within normal limits. IMPRESSION: 1. Normal CT appearance of the brain. 2. Increased mucosal thickening in the left maxillary sinus. The above was relayed via text pager to Dr. Viviann Spare on 04/25/2023 at 18:56 . Electronically Signed   By: Marin Roberts M.D.    On: 04/25/2023 18:56   CARDIAC CATHETERIZATION  Result Date: 04/25/2023   Hemodynamic findings consistent with mild pulmonary hypertension. Mild elevation of right heart pressures   CT RENAL STONE STUDY  Result Date: 04/22/2023 CLINICAL DATA:  Left flank pain, nausea EXAM: CT ABDOMEN AND PELVIS WITHOUT CONTRAST TECHNIQUE: Multidetector CT imaging of the abdomen and pelvis was performed following the standard protocol without IV contrast. RADIATION DOSE  REDUCTION: This exam was performed according to the departmental dose-optimization program which includes automated exposure control, adjustment of the mA and/or kV according to patient size and/or use of iterative reconstruction technique. COMPARISON:  04/18/2023 FINDINGS: Lower chest: Interval development of bibasilar peribronchovascular ground-glass pulmonary infiltrates likely related to multifocal infection or noncardiogenic pulmonary edema. Trace bilateral pleural effusions. Extensive multi-vessel coronary artery calcification. Global cardiac size within normal limits. Hepatobiliary: No focal liver abnormality is seen. Status post cholecystectomy. No biliary dilatation. Pancreas: Unremarkable. No pancreatic ductal dilatation or surrounding inflammatory changes. Spleen: Mild splenomegaly is stable with the spleen measuring 15.6 cm in greatest dimension. This appears slightly progressive since remote prior examination 10/07/2022. No intrasplenic lesions identified on this noncontrast examination. Adrenals/Urinary Tract: The adrenal glands are unremarkable. The kidneys are normal in size and position. 1-2 mm punctate nonobstructing calculi are noted within the interpolar and lower polar regions of the kidneys bilaterally. No hydronephrosis. Minimal nonspecific bilateral perinephric stranding. No perinephric fluid collections. No hydronephrosis. No ureteral calculi. The bladder is unremarkable. Stomach/Bowel: Moderate colonic stool burden without evidence of  obstruction. Status post subtotal appendectomy. The stomach, small bowel, and large bowel are otherwise unremarkable. No evidence of obstruction or focal inflammation. No free intraperitoneal gas or fluid. Vascular/Lymphatic: Aortic atherosclerosis. No enlarged abdominal or pelvic lymph nodes. Reproductive: Status post hysterectomy. No adnexal masses. Other: No abdominal wall hernia. Musculoskeletal: Osseous structures are age-appropriate. No acute bone abnormality. No lytic or blastic bone lesion. IMPRESSION: 1. Interval development of bibasilar peribronchovascular ground-glass pulmonary infiltrates likely related to multifocal infection or noncardiogenic pulmonary edema. Trace bilateral pleural effusions. 2. Extensive multi-vessel coronary artery calcification. 3. Mild splenomegaly, progressive since remote prior examination of 10/07/2022. 4. Minimal bilateral nonobstructing nephrolithiasis. No urolithiasis. No hydronephrosis. 5. Moderate colonic stool burden without evidence of obstruction. 6. Aortic atherosclerosis. Aortic Atherosclerosis (ICD10-I70.0). Electronically Signed   By: Helyn Numbers M.D.   On: 04/22/2023 19:45   ECHOCARDIOGRAM LIMITED  Result Date: 04/22/2023    ECHOCARDIOGRAM LIMITED REPORT   Patient Name:   [name] Date of Exam: 04/22/2023 Medical Rec #:  [id number]      Height:       63.0 in Accession #:    [id number]     Weight:       245.0 lb Date of Birth:  June 18, 1961      BSA:          2.108 m Patient Age:    61 years       BP:           117/62 mmHg Patient Gender: F              HR:           67 bpm. [facility] Procedure: Limited Echo, Cardiac Doppler and Color Doppler Indications:    CHF                 IVC check  History:        Patient has prior history of Echocardiogram examinations, most                 recent 04/17/2023. CHF, CAD and Angina, Arrythmias:Atrial                 Fibrillation, Signs/Symptoms:Chest Pain; Risk                 Factors:Hypertension,  Diabetes and Dyslipidemia. CKD.  Sonographer:    Mikki Harbor Referring Phys: [id number] VISHNU P  MALLIPEDDI  Sonographer Comments: Patient is obese. IMPRESSIONS  1. Limited Echo for IVC check.  2. Left ventricular ejection fraction, by estimation, is 60 to 65%. The left ventricle has normal function. Left ventricular diastolic function could not be evaluated.  3. Right ventricular systolic function is normal. The right ventricular size is normal. There is normal pulmonary artery systolic pressure.  4. The inferior vena cava is normal in size with <50% respiratory variability, suggesting right atrial pressure of 8 mmHg. FINDINGS  Left Ventricle: Left ventricular ejection fraction, by estimation, is 60 to 65%. The left ventricle has normal function. The left ventricular internal cavity size was normal in size. There is no left ventricular hypertrophy. Left ventricular diastolic function could not be evaluated. Right Ventricle: The right ventricular size is normal. Right ventricular systolic function is normal. There is normal pulmonary artery systolic pressure. The tricuspid regurgitant velocity is 2.52 m/s, and with an assumed right atrial pressure of 8 mmHg,  the estimated right ventricular systolic pressure is 33.4 mmHg. Left Atrium: Left atrial size was not assessed. Right Atrium: Right atrial size was not assessed. Pericardium: There is no evidence of pericardial effusion. Mitral Valve: The mitral valve is normal in structure. Tricuspid Valve: The tricuspid valve is normal in structure. Tricuspid valve regurgitation is trivial. Aortic Valve: The aortic valve is tricuspid. There is mild calcification of the aortic valve. There is mild thickening of the aortic valve. There is mild aortic valve annular calcification. Pulmonic Valve: The pulmonic valve was normal in structure. Aorta: The aortic root is normal in size and structure. Venous: The inferior vena cava is normal in size with less than 50% respiratory  variability, suggesting right atrial pressure of 8 mmHg. IAS/Shunts: The interatrial septum was not assessed. LEFT VENTRICLE PLAX 2D LVIDd:         4.70 cm LVIDs:         3.00 cm LV PW:         1.20 cm LV IVS:        1.10 cm LVOT diam:     2.10 cm LVOT Area:     3.46 cm  LEFT ATRIUM         Index LA diam:    4.40 cm 2.09 cm/m   AORTA Ao Root diam: 3.10 cm TRICUSPID VALVE TR Peak grad:   25.4 mmHg TR Vmax:        252.00 cm/s  SHUNTS Systemic Diam: 2.10 cm Vishnu Priya Mallipeddi Electronically signed by Winfield Rast Mallipeddi Signature Date/Time: 04/22/2023/5:07:38 PM    Final    DG CHEST PORT 1 VIEW  Result Date: 04/21/2023 CLINICAL DATA:  Chest pain EXAM: PORTABLE CHEST 1 VIEW COMPARISON:  04/20/2023 FINDINGS: The heart size and mediastinal contours are within normal limits. Both lungs are clear. The visualized skeletal structures are unremarkable. IMPRESSION: No active disease. Electronically Signed   By: Charlett Nose M.D.   On: 04/21/2023 16:58   DG CHEST PORT 1 VIEW  Result Date: 04/20/2023 CLINICAL DATA:  [id number] Chest pain [id number] EXAM: PORTABLE CHEST - 1 VIEW COMPARISON:  04/15/2023 FINDINGS: Relatively low lung volumes with some crowding of bronchovascular structures centrally. No new infiltrate. No pneumothorax. Heart size and mediastinal contours are within normal limits. No effusion. Visualized bones unremarkable. IMPRESSION: Low lung volumes. No acute findings. Electronically Signed   By: Corlis Leak M.D.   On: 04/20/2023 10:03   US RENAL  Result Date: 04/19/2023 CLINICAL DATA:  Acute on chronic kidney injury EXAM: RENAL /  URINARY TRACT ULTRASOUND COMPLETE COMPARISON:  CT abdomen pelvis 04/18/2023 FINDINGS: Right Kidney: Renal measurements: 9.4 x 4.9 x 4.4 cm = volume: 105 mL. Echogenicity within normal limits. No mass or hydronephrosis visualized. Left Kidney: Renal measurements: 11.6 x 5.6 x 5.0 cm = volume: 169 mL. Echogenicity within normal limits. No mass or hydronephrosis visualized.  Bladder: Limited evaluation due to to collapsed configuration. Other: Diffuse increased echogenicity of the visualized portions of the hepatic parenchyma are a nonspecific indicator of hepatocellular dysfunction, most commonly steatosis. IMPRESSION: No significant sonographic abnormality of the kidneys. Electronically Signed   By: Acquanetta Belling M.D.   On: 04/19/2023 10:55   CT ABDOMEN PELVIS WO CONTRAST  Result Date: 04/18/2023 CLINICAL DATA:  Right lower quadrant abdominal pain EXAM: CT ABDOMEN AND PELVIS WITHOUT CONTRAST TECHNIQUE: Multidetector CT imaging of the abdomen and pelvis was performed following the standard protocol without IV contrast. RADIATION DOSE REDUCTION: This exam was performed according to the departmental dose-optimization program which includes automated exposure control, adjustment of the mA and/or kV according to patient size and/or use of iterative reconstruction technique. COMPARISON:  None Available. FINDINGS: Lower chest: No acute findings. Coronary artery and aortic calcifications. Hepatobiliary: Prior cholecystectomy. Diffuse fatty infiltration of the liver. No focal hepatic abnormality. Mild hepatomegaly with craniocaudal length 23 cm. Pancreas: No focal abnormality or ductal dilatation. Spleen: Splenomegaly with a craniocaudal length of 14.5 cm. This is stable since prior study. No focal abnormality. Adrenals/Urinary Tract: Punctate bilateral nonobstructing renal calculi. No ureteral stones or hydronephrosis. Adrenal glands and urinary bladder unremarkable. Stomach/Bowel: Stomach, large and small bowel grossly unremarkable. Vascular/Lymphatic: Aortic atherosclerosis. No evidence of aneurysm or adenopathy. Reproductive: Prior hysterectomy.  No adnexal masses. Other: No free fluid or free air. Musculoskeletal: No acute bony abnormality. IMPRESSION: Hepatic steatosis.  Hepatosplenomegaly. No acute findings in the abdomen or pelvis. Aortic atherosclerosis, coronary artery disease.  Punctate bilateral nephrolithiasis.  No hydronephrosis. Electronically Signed   By: Charlett Nose M.D.   On: 04/18/2023 19:22   ECHOCARDIOGRAM COMPLETE  Result Date: 04/17/2023    ECHOCARDIOGRAM REPORT   Patient Name:   [name] Date of Exam: 04/17/2023 Medical Rec #:  [id number]      Height:       63.0 in Accession #:    [id number]     Weight:       245.0 lb Date of Birth:  December 12, 1960      BSA:          2.108 m Patient Age:    61 years       BP:           136/73 mmHg Patient Gender: F              HR:           71 bpm. [facility] Procedure: 2D Echo, Cardiac Doppler and Color Doppler Indications:    Chest Pain R07.9  History:        Patient has no prior history of Echocardiogram examinations.                 CAD, Arrythmias:Atrial Fibrillation, Signs/Symptoms:Chest Pain;                 Risk Factors:Dyslipidemia, Hypertension and Diabetes.  Sonographer:    Aron Baba Referring Phys: [id number] Ellsworth Lennox  Sonographer Comments: Patient is obese. IMPRESSIONS  1. Left ventricular ejection fraction, by estimation, is 55 to 60%. The left ventricle has normal function. The  left ventricle has no regional wall motion abnormalities. Left ventricular diastolic parameters are consistent with Grade I diastolic dysfunction (impaired relaxation).  2. Right ventricular systolic function is normal. The right ventricular size is normal. Tricuspid regurgitation signal is inadequate for assessing PA pressure.  3. The mitral valve is abnormal. Mild mitral valve regurgitation. No evidence of mitral stenosis.  4. The aortic valve is tricuspid. Aortic valve regurgitation is not visualized. No aortic stenosis is present.  5. Aortic dilatation noted. 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  6. The inferior vena cava is normal in size with greater than 50% respiratory variability, suggesting right atrial pressure of 3 mmHg. FINDINGS  Left Ventricle: Left ventricular ejection fraction, by  estimation, is 55 to 60%. The left ventricle has normal function. The left ventricle has no regional wall motion abnormalities. The left ventricular internal cavity size was normal in size. There is  no left ventricular hypertrophy. Left ventricular diastolic parameters are consistent with Grade I diastolic dysfunction (impaired relaxation). Normal left ventricular filling pressure. Right Ventricle: The right ventricular size is normal. Right vetricular wall thickness was not well visualized. Right ventricular systolic function is normal. Tricuspid regurgitation signal is inadequate for assessing PA pressure. Left Atrium: Left atrial size was normal in size. Right Atrium: Right atrial size was normal in size. Pericardium: There is no evidence of pericardial effusion. Mitral Valve: The mitral valve is abnormal. Mild mitral valve regurgitation. No evidence of mitral valve stenosis. Tricuspid Valve: The tricuspid valve is normal in structure. Tricuspid valve regurgitation is trivial. No evidence of tricuspid stenosis. Aortic Valve: The aortic valve is tricuspid. Aortic valve regurgitation is not visualized. No aortic stenosis is present. Aortic valve mean gradient measures 5.4 mmHg. Aortic valve peak gradient measures 8.7 mmHg. Aortic valve area, by VTI measures 1.85 cm. Pulmonic Valve: The pulmonic valve was not well visualized. Pulmonic valve regurgitation is not visualized. No evidence of pulmonic stenosis. Aorta: The aortic root is normal in size and structure and aortic dilatation noted. 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 Venous: The inferior vena cava is normal in size with greater than 50% respiratory variability, suggesting right atrial pressure of 3 mmHg. IAS/Shunts: No atrial level shunt detected by color flow Doppler.  LEFT VENTRICLE PLAX 2D LVIDd:         5.00 cm   Diastology LVIDs:         3.60 cm   LV e' medial:    13.20 cm/s LV PW:         0.90 cm   LV E/e' medial:  5.8 LV  IVS:        0.80 cm   LV e' lateral:   6.22 cm/s LVOT diam:     2.00 cm   LV E/e' lateral: 12.3 LV SV:         67 LV SV Index:   32 LVOT Area:     3.14 cm  RIGHT VENTRICLE RV S prime:     12.60 cm/s TAPSE (M-mode): 2.2 cm LEFT ATRIUM             Index        RIGHT ATRIUM           Index LA diam:        4.00 cm 1.90 cm/m   RA Area:     10.10 cm LA Vol (A2C):   69.3 ml 32.88 ml/m  RA Volume:   20.40  ml  9.68 ml/m LA Vol (A4C):   65.9 ml 31.26 ml/m LA Biplane Vol: 70.8 ml 33.59 ml/m  AORTIC VALVE AV Area (Vmax):    1.94 cm AV Area (Vmean):   1.69 cm AV Area (VTI):     1.85 cm AV Vmax:           147.81 cm/s AV Vmean:          109.081 cm/s AV VTI:            0.360 m AV Peak Grad:      8.7 mmHg AV Mean Grad:      5.4 mmHg LVOT Vmax:         91.34 cm/s LVOT Vmean:        58.622 cm/s LVOT VTI:          0.212 m LVOT/AV VTI ratio: 0.59  AORTA Ao Root diam: 3.40 cm Ao Asc diam:  3.60 cm MITRAL VALVE MV Area (PHT): 4.71 cm     SHUNTS MV Decel Time: 161 msec     Systemic VTI:  0.21 m MR Peak grad: 82.2 mmHg     Systemic Diam: 2.00 cm MR Vmax:      453.33 cm/s MV E velocity: 76.60 cm/s MV A velocity: 108.00 cm/s MV E/A ratio:  0.71 Dina Rich MD Electronically signed by Dina Rich MD Signature Date/Time: 04/17/2023/1:33:26 PM    Final    CT CHEST ABDOMEN PELVIS WO CONTRAST  Result Date: 04/16/2023 CLINICAL DATA:  Sharp chest pain radiating to back. Left-sided rib pain and right-sided abdominal pain. EXAM: CT CHEST, ABDOMEN AND PELVIS WITHOUT CONTRAST TECHNIQUE: Multidetector CT imaging of the chest, abdomen and pelvis was performed following the standard protocol without IV contrast. RADIATION DOSE REDUCTION: This exam was performed according to the departmental dose-optimization program which includes automated exposure control, adjustment of the mA and/or kV according to patient size and/or use of iterative reconstruction technique. COMPARISON:  10/07/2022. FINDINGS: CT CHEST FINDINGS Cardiovascular: The  heart is normal in size and there is no pericardial effusion. Three-vessel coronary artery calcifications are noted. There is atherosclerotic calcification of the aorta without evidence of aneurysm. The pulmonary trunk is normal in caliber. Mediastinum/Nodes: No mediastinal or hilar lymphadenopathy. Evaluation of the hila is limited due to lack of IV contrast. Coarse calcification is noted in the left lobe of the thyroid gland. The trachea and esophagus are within normal limits. Lungs/Pleura: Mild atelectasis is present bilaterally. No effusion or pneumothorax. Small calcified granuloma are present bilaterally. Musculoskeletal: Degenerative changes are present in the thoracic spine. No acute fracture is seen. CT ABDOMEN PELVIS FINDINGS Hepatobiliary: No focal liver abnormality is seen. The liver is mildly enlarged. No gallstones, gallbladder wall thickening, or biliary dilatation. Pancreas: Unremarkable. No pancreatic ductal dilatation or surrounding inflammatory changes. Spleen: The spleen is enlarged at 14.1 cm in length. No focal abnormality. Adrenals/Urinary Tract: The adrenal glands are within normal limits. A nonobstructive renal calculus is noted on the right. No hydroureteronephrosis bilaterally. The bladder is unremarkable. Stomach/Bowel: Stomach is within normal limits. Appendix is surgically absent. No evidence of bowel wall thickening, distention, or inflammatory changes. No free air or pneumatosis. Vascular/Lymphatic: Aortic atherosclerosis. No enlarged abdominal or pelvic lymph nodes. Reproductive: Status post hysterectomy. No adnexal masses. Other: No abdominopelvic ascites. Subcutaneous fat stranding is noted in the anterior abdominal wall in the left lower quadrant, unchanged from the prior exam. Musculoskeletal: Degenerative changes are present in the lumbar spine. No acute fracture. IMPRESSION: 1. No acute process  in the chest, abdomen, or pelvis. 2. Nonobstructive right renal calculus. 3. Mild  hepatosplenomegaly. 4. Subcutaneous fat stranding in the anterior abdominal wall in the left lower quadrant, unchanged from the prior exam, possible cellulitis. 5. Coronary artery calcifications. 6. Aortic atherosclerosis. Electronically Signed   By: Thornell Sartorius M.D.   On: 04/16/2023 00:19   DG Chest 2 View  Result Date: 04/15/2023 CLINICAL DATA:  Left-sided chest pain for 2 days EXAM: CHEST - 2 VIEW COMPARISON:  03/30/2023 FINDINGS: Frontal and lateral views of the chest demonstrate an unremarkable cardiac silhouette. No airspace disease, effusion, or pneumothorax. No acute bony abnormality. IMPRESSION: 1. No acute intrathoracic process. Electronically Signed   By: Sharlet Salina M.D.   On: 04/15/2023 22:33    Barnetta Chapel, MD  [facility]  If 7PM-7AM, please contact night-coverage [email or web address] Password [email or web address] 05/02/2023, 8:14 PM   LOS: 15 days

## 2023-05-02 NOTE — Progress Notes (Signed)
Pt is a 62 y.o. yo female with longstanding DM and HTN who was admitted on 04/15/2023 with CP   Assessment/Plan: 1. Renal-  baseline crt 1.1 to 1.4-  ( was 1.49 on 03/30/23) , on 7/30 1.9 upon admission and then progressed to 2.5-2.75.  UA bland and US unremarkable-  occasional lower BPs noted during course but not on ACE/ARB.  The plan was in place to get renal artery duplex for further workup.  There are no indications for dialysis so no plans for such-   she is nonoliguric (1L/24h).  orthostatics slightly positive by pulse. Last dose of Lasix 80mg  was on 8/11  Restarted Lasix 40mg  BID on 8/13. Renal function seems to be consistently in the 2.4-2.6 range in the past week. Renal function a little better today but will hold Lasix as she's not really eating or drinking with the nausea.    2. CV -  right heart cath 8/9 c/Marks some pulmonary HTN and volume overload-  had been diuresed and tolerated  well -    If cards felt strongly about pursuing left heart cath we would do whatever we needed to do -  not sure that she would require dialysis even then.  Difficult situation as I am not sure she will ever say that she feels good so difficult to interpret her sxms.   3. Anemia-  is not helping things-  last 4/4 dose of IV iron given 8/12 and ESA on 8/9  5. HTN/volume-  coreg 12.5 BID and imdur 90-  given lasix -  now that her crt is bumping-  was orthostatic by pulse   Subjective:  says she feels worse today with pain in lower quadrants + nausea + mouth dry.   Cardiology may be planning for left heart cath ?      Objective Vital signs in last 24 hours: Vitals:   05/01/23 2056 05/02/23 0022 05/02/23 0244 05/02/23 0918  BP: (!) 127/51 (!) 152/70 118/74 (!) 148/73  Pulse:  67  70  Resp: 18  20 18   Temp: 98.2 F (36.8 C)  98.5 F (36.9 C)   TempSrc: Oral  Oral   SpO2:  97%    Weight:   110 kg   Height:       Weight change: 0.091 kg  Intake/Output Summary (Last 24 hours) at 05/02/2023 1034 Last  data filed at 05/01/2023 2202 Gross per 24 hour  Intake 243 ml  Output --  Net 243 ml      [name]    Labs: Basic Metabolic Panel: Recent Labs  Lab 04/30/23 0037 05/01/23 0724 05/02/23 0327  NA 133* 133* 129*  K 4.0 3.5 3.5  CL 94* 91* 90*  CO2 27 28 25   GLUCOSE 256* 179* 264*  BUN 37* 37* 42*  CREATININE 2.49* 2.65* 2.42*  CALCIUM 8.6* 8.5* 8.4*  PHOS 4.0 4.7* 3.9   Liver Function Tests: Recent Labs  Lab 04/30/23 0037 05/01/23 0724 05/02/23 0327  ALBUMIN 3.1* 3.3* 3.1*   No results for input(s): "LIPASE", "AMYLASE" in the last 168 hours. No results for input(s): "AMMONIA" in the last 168 hours. CBC: Recent Labs  Lab 04/26/23 0238 04/27/23 0118 04/28/23 0040 04/29/23 0103  WBC 4.6 5.5 5.4 5.2  HGB 8.6* 8.5* 8.7* 8.7*  HCT 27.0* 27.6* 28.2* 28.3*  MCV 86.5 87.6 88.1 87.9  PLT 161 173 179 182   Cardiac Enzymes: No results for input(s): "CKTOTAL", "CKMB", "CKMBINDEX", "TROPONINI" in the last 168 hours.  CBG: Recent Labs  Lab 05/01/23 1142 05/01/23 1535 05/01/23 2054 05/02/23 0336 05/02/23 0752  GLUCAP 260* 249* 310* 261* 223*    Iron Studies: No results for input(s): "IRON", "TIBC", "TRANSFERRIN", "FERRITIN" in the last 72 hours. Studies/Results: No results found. Medications: Infusions:  sodium chloride Stopped (04/28/23 1915)    Scheduled Medications:  amLODipine  5 mg Oral Daily   apixaban  5 mg Oral BID   atorvastatin  80 mg Oral q morning   carvedilol  12.5 mg Oral BID WC   clonazePAM  0.5 mg Oral TID   clopidogrel  75 mg Oral Daily   darbepoetin (ARANESP) injection - NON-DIALYSIS  60 mcg Subcutaneous Q Fri-1800   ezetimibe  10 mg Oral Daily   furosemide  40 mg Intravenous BID   gabapentin  300 mg Oral QHS   insulin aspart  0-15 Units Subcutaneous TID WC   insulin aspart  0-5 Units Subcutaneous QHS   insulin aspart  14 Units Subcutaneous TID WC   insulin detemir  50 Units Subcutaneous BID   isosorbide mononitrate  90 mg  Oral q morning   lamoTRIgine  100 mg Oral BID   multivitamin with minerals  1 tablet Oral Daily   omega-3 acid ethyl esters  1 g Oral BID   polyethylene glycol  17 g Oral BID   senna-docusate  2 tablet Oral QHS   sertraline  100 mg Oral Daily   sodium chloride flush  3 mL Intravenous Q12H    have reviewed scheduled and prn medications.  Physical Exam: General: obese, no distress  Heart: RRR  Lungs: mostly clear Abdomen: obese, soft non tender Extremities:no obvious peripheral edema     05/02/2023,10:34 AM  LOS: 15 days

## 2023-05-03 DIAGNOSIS — R0789 Other chest pain: Secondary | ICD-10-CM | POA: Diagnosis not present

## 2023-05-03 LAB — RENAL FUNCTION PANEL
Albumin: 3.1 g/dL — ABNORMAL LOW (ref 3.5–5.0)
Anion gap: 16 — ABNORMAL HIGH (ref 5–15)
BUN: 43 mg/dL — ABNORMAL HIGH (ref 6–20)
CO2: 25 mmol/L (ref 22–32)
Calcium: 8.6 mg/dL — ABNORMAL LOW (ref 8.9–10.3)
Chloride: 91 mmol/L — ABNORMAL LOW (ref 98–111)
Creatinine, Ser: 2.3 mg/dL — ABNORMAL HIGH (ref 0.44–1.00)
GFR, Estimated: 24 mL/min — ABNORMAL LOW (ref >60)
Glucose, Bld: 324 mg/dL — ABNORMAL HIGH (ref 70–99)
Phosphorus: 4.5 mg/dL (ref 2.5–4.6)
Potassium: 3.7 mmol/L (ref 3.5–5.1)
Sodium: 132 mmol/L — ABNORMAL LOW (ref 135–145)

## 2023-05-03 LAB — GLUCOSE, CAPILLARY
Glucose-Capillary: 222 mg/dL — ABNORMAL HIGH (ref 70–99)
Glucose-Capillary: 268 mg/dL — ABNORMAL HIGH (ref 70–99)
Glucose-Capillary: 242 mg/dL — ABNORMAL HIGH (ref 70–99)
Glucose-Capillary: 439 mg/dL — ABNORMAL HIGH (ref 70–99)

## 2023-05-03 MED ORDER — ISOSORBIDE MONONITRATE ER 60 MG PO TB24: 60.0000 mg | ORAL_TABLET | Freq: Every morning | ORAL | 0 refills | Status: DC

## 2023-05-03 MED ORDER — CARVEDILOL 25 MG PO TABS: 12.5000 mg | ORAL_TABLET | Freq: Two times a day (BID) | ORAL | 0 refills | Status: DC

## 2023-05-03 MED ORDER — EZETIMIBE 10 MG PO TABS: 10.0000 mg | ORAL_TABLET | Freq: Every day | ORAL | 3 refills | Status: DC

## 2023-05-03 MED ORDER — AMLODIPINE BESYLATE 2.5 MG PO TABS: 2.5000 mg | ORAL_TABLET | Freq: Every day | ORAL | 3 refills | Status: DC

## 2023-05-03 NOTE — Progress Notes (Signed)
Pt is a 62 y.o. yo female with longstanding DM and HTN who was admitted on 04/15/2023 with CP   Assessment/Plan: 1. Renal-  baseline crt 1.1 to 1.4-  ( was 1.49 on 03/30/23) , on 7/30 1.9 upon admission and then progressed to 2.5-2.75.  UA bland and US unremarkable-  occasional lower BPs noted during course but not on ACE/ARB.  The plan was in place to get renal artery duplex for further workup.  There are no indications for dialysis so no plans for such-   she is nonoliguric (1L/24h).  orthostatics slightly positive by pulse. Last dose of Lasix 80mg  was on 8/11  Restarted Lasix 40mg  BID on 8/13. Renal function seems to be consistently in the 2.4-2.6 range in the past week. Stopped the Lasix on 8/16 when she had nausea and wasn't really eating or drinking.  She feels much better now and renal function improving. Signing off at this time; please reconsult as needed. From renal standpoint she is stable for d/c.  2. CV -  right heart cath 8/9 c/w some pulmonary HTN and volume overload-  had been diuresed and tolerated  well -    If cards felt strongly about pursuing left heart cath we would do whatever we needed to do -  not sure that she would require dialysis even then.  Difficult situation as I am not sure she will ever say that she feels good so difficult to interpret her sxms.   3. Anemia-  is not helping things-  last 4/4 dose of IV iron given 8/12 and ESA on 8/9  5. HTN/volume-  coreg 12.5 BID and imdur 90-  given lasix -  now that her crt is bumping-  was orthostatic by pulse   Subjective:  says she feels much better  today; +BM.  Cardiology may be planning for left heart cath ?      Objective Vital signs in last 24 hours: Vitals:   05/02/23 1138 05/02/23 2121 05/03/23 0340 05/03/23 0805  BP: (!) 120/54 126/60 (!) 152/59 133/69  Pulse: 60 65 66 66  Resp: 16  15 16   Temp: (!) 97.5 F (36.4 C) (!) 97.5 F (36.4 C) 98.2 F (36.8 C) 97.7 F (36.5 C)  TempSrc: Oral Oral Oral Oral   SpO2:  93% 97% 99%  Weight:   109.5 kg   Height:       Weight change: -0.544 kg  Intake/Output Summary (Last 24 hours) at 05/03/2023 0916 Last data filed at 05/02/2023 2100 Gross per 24 hour  Intake 240 ml  Output --  Net 240 ml      Imran Nuon W    Labs: Basic Metabolic Panel: Recent Labs  Lab 05/01/23 0724 05/02/23 0327 05/03/23 0141  NA 133* 129* 132*  K 3.5 3.5 3.7  CL 91* 90* 91*  CO2 28 25 25   GLUCOSE 179* 264* 324*  BUN 37* 42* 43*  CREATININE 2.65* 2.42* 2.30*  CALCIUM 8.5* 8.4* 8.6*  PHOS 4.7* 3.9 4.5   Liver Function Tests: Recent Labs  Lab 05/01/23 0724 05/02/23 0327 05/03/23 0141  ALBUMIN 3.3* 3.1* 3.1*   No results for input(s): "LIPASE", "AMYLASE" in the last 168 hours. No results for input(s): "AMMONIA" in the last 168 hours. CBC: Recent Labs  Lab 04/27/23 0118 04/28/23 0040 04/29/23 0103  WBC 5.5 5.4 5.2  HGB 8.5* 8.7* 8.7*  HCT 27.6* 28.2* 28.3*  MCV 87.6 88.1 87.9  PLT 173 179 182   Cardiac Enzymes: No  results for input(s): "CKTOTAL", "CKMB", "CKMBINDEX", "TROPONINI" in the last 168 hours.  CBG: Recent Labs  Lab 05/02/23 1140 05/02/23 1534 05/02/23 2116 05/03/23 0338 05/03/23 0803  GLUCAP 163* 200* 222* 268* 242*    Iron Studies: No results for input(s): "IRON", "TIBC", "TRANSFERRIN", "FERRITIN" in the last 72 hours. Studies/Results: No results found. Medications: Infusions:  sodium chloride Stopped (04/28/23 1915)    Scheduled Medications:  amLODipine  2.5 mg Oral Daily   apixaban  5 mg Oral BID   atorvastatin  80 mg Oral q morning   carvedilol  12.5 mg Oral BID WC   clonazePAM  0.5 mg Oral TID   clopidogrel  75 mg Oral Daily   darbepoetin (ARANESP) injection - NON-DIALYSIS  60 mcg Subcutaneous Q Fri-1800   ezetimibe  10 mg Oral Daily   gabapentin  300 mg Oral QHS   insulin aspart  0-15 Units Subcutaneous TID WC   insulin aspart  0-5 Units Subcutaneous QHS   insulin aspart  14 Units Subcutaneous TID WC    insulin detemir  50 Units Subcutaneous BID   isosorbide mononitrate  60 mg Oral q morning   lamoTRIgine  100 mg Oral BID   multivitamin with minerals  1 tablet Oral Daily   omega-3 acid ethyl esters  1 g Oral BID   polyethylene glycol  17 g Oral BID   senna-docusate  2 tablet Oral QHS   sertraline  100 mg Oral Daily   sodium chloride flush  3 mL Intravenous Q12H    have reviewed scheduled and prn medications.  Physical Exam: General: obese, no distress  Heart: RRR  Lungs: mostly clear Abdomen: obese, soft non tender Extremities:no obvious peripheral edema     05/03/2023,9:16 AM  LOS: 16 days

## 2023-05-03 NOTE — Progress Notes (Signed)
Rounding Note    Patient Name: [name] Date of Encounter: 05/03/2023  [facility] Cardiologist: Marjo Bicker, MD   Patient Profile     62 y.o. female with a history of CKD, CAD with PCI 2022, PAF on eliquis, HLD, DM2 with hyperglycemia, and anemia presented to [facility] with chest pain.  The complaints of orthostatic lightheadedness Troponins were negative and ECG is benign medical therapy was recommended  Subjective   Feels much better ready to go home.  Will be seeking a cardiologist in [facility]  Inpatient Medications    Scheduled Meds:  amLODipine  2.5 mg Oral Daily   apixaban  5 mg Oral BID   atorvastatin  80 mg Oral q morning   carvedilol  12.5 mg Oral BID WC   clonazePAM  0.5 mg Oral TID   clopidogrel  75 mg Oral Daily   darbepoetin (ARANESP) injection - NON-DIALYSIS  60 mcg Subcutaneous Q Fri-1800   ezetimibe  10 mg Oral Daily   gabapentin  300 mg Oral QHS   insulin aspart  0-15 Units Subcutaneous TID WC   insulin aspart  0-5 Units Subcutaneous QHS   insulin aspart  14 Units Subcutaneous TID WC   insulin detemir  50 Units Subcutaneous BID   isosorbide mononitrate  60 mg Oral q morning   lamoTRIgine  100 mg Oral BID   multivitamin with minerals  1 tablet Oral Daily   omega-3 acid ethyl esters  1 g Oral BID   polyethylene glycol  17 g Oral BID   senna-docusate  2 tablet Oral QHS   sertraline  100 mg Oral Daily   sodium chloride flush  3 mL Intravenous Q12H   Continuous Infusions:  sodium chloride Stopped (04/28/23 1915)   PRN Meds: sodium chloride, acetaminophen **OR** acetaminophen, alum & mag hydroxide-simeth, bisacodyl, diphenhydrAMINE, fentaNYL (SUBLIMAZE) injection, nitroGLYCERIN, ondansetron **OR** ondansetron (ZOFRAN) IV, mouth rinse, oxyCODONE, prochlorperazine, sodium chloride flush   Vital Signs    Vitals:   05/02/23 2121 05/03/23 0340 05/03/23 0805 05/03/23 1230  BP: 126/60 (!) 152/59 133/69 (!) 133/59  Pulse: 65 66 66 71   Resp:  15 16 18   Temp: (!) 97.5 F (36.4 C) 98.2 F (36.8 C) 97.7 F (36.5 C) 97.9 F (36.6 C)  TempSrc: Oral Oral Oral Oral  SpO2: 93% 97% 99% 99%  Weight:  109.5 kg    Height:        Intake/Output Summary (Last 24 hours) at 05/03/2023 1346 Last data filed at 05/02/2023 2100 Gross per 24 hour  Intake 240 ml  Output --  Net 240 ml      05/03/2023    3:40 AM 05/02/2023    2:44 AM 05/01/2023    3:46 AM  Last 3 Weights  Weight (lbs) 241 lb 4.8 oz 242 lb 8 oz 242 lb 4.8 oz  Weight (kg) 109.453 kg 109.997 kg 109.907 kg      Telemetry      - Personally Reviewed  ECG    No new tracings - Personally Reviewed  Physical Exam  Well developed and nourished in no acute distress HENT normal Neck supple Clear Regular rate and rhythm, no murmurs or gallops Abd-soft with active BS No Clubbing cyanosis edema Skin-warm and diaphoretic but says she always is A & Oriented  Grossly normal sensory and motor function   Labs    High Sensitivity Troponin:   Recent Labs  Lab 04/22/23 0432 04/24/23 1221 04/24/23 1436 04/27/23 1608 04/27/23  1817  TROPONINIHS 4 4 4 5 5      Chemistry Recent Labs  Lab 05/01/23 0724 05/02/23 0327 05/03/23 0141  NA 133* 129* 132*  K 3.5 3.5 3.7  CL 91* 90* 91*  CO2 28 25 25   GLUCOSE 179* 264* 324*  BUN 37* 42* 43*  CREATININE 2.65* 2.42* 2.30*  CALCIUM 8.5* 8.4* 8.6*  ALBUMIN 3.3* 3.1* 3.1*  GFRNONAA 20* 22* 24*  ANIONGAP 14 14 16*    Lipids No results for input(s): "CHOL", "TRIG", "HDL", "LABVLDL", "LDLCALC", "CHOLHDL" in the last 168 hours.  Hematology Recent Labs  Lab 04/27/23 0118 04/28/23 0040 04/29/23 0103  WBC 5.5 5.4 5.2  RBC 3.15* 3.20* 3.22*  HGB 8.5* 8.7* 8.7*  HCT 27.6* 28.2* 28.3*  MCV 87.6 88.1 87.9  MCH 27.0 27.2 27.0  MCHC 30.8 30.9 30.7  RDW 17.9* 18.1* 18.1*  PLT 173 179 182   Thyroid No results for input(s): "TSH", "FREET4" in the last 168 hours.  BNPNo results for input(s): "BNP", "PROBNP" in the last 168  hours.  DDimer No results for input(s): "DDIMER" in the last 168 hours.   Radiology    No results found.  Cardiac Studies   Echo 04/17/23 IMPRESSIONS    1. Left ventricular ejection fraction, by estimation, is 55 to 60%. The  left ventricle has normal function. The left ventricle has no regional  wall motion abnormalities. Left ventricular diastolic parameters are  consistent with Grade I diastolic  dysfunction (impaired relaxation).   2. Right ventricular systolic function is normal. The right ventricular  size is normal. Tricuspid regurgitation signal is inadequate for assessing  PA pressure.   3. The mitral valve is abnormal. Mild mitral valve regurgitation. No  evidence of mitral stenosis.   4. The aortic valve is tricuspid. Aortic valve regurgitation is not  visualized. No aortic stenosis is present.   5. Aortic dilatation noted. There is mild dilatation of the ascending  aorta, measuring 36 mm.   6. The inferior vena cava is normal in size with greater than 50%  respiratory variability, suggesting right atrial pressure of 3 mmHg.    Limited Echo 04/22/23 IMPRESSIONS    1. Limited Echo for IVC check.   2. Left ventricular ejection fraction, by estimation, is 60 to 65%. The  left ventricle has normal function. Left ventricular diastolic function  could not be evaluated.   3. Right ventricular systolic function is normal. The right ventricular  size is normal. There is normal pulmonary artery systolic pressure.   4. The inferior vena cava is normal in size with <50% respiratory  variability, suggesting right atrial pressure of 8 mmHg.    RHC 04/25/23   Hemodynamic findings consistent with mild pulmonary hypertension.   Mild elevation of right heart pressures  Patient Profile     62 y.o. female with a history of CKD, CAD with PCI 2022, PAF on eliquis, HLD, DM2 with hyperglycemia, and anemia presented to [facility] with chest pain.  Had chest pain last night, is also dizzy with  standing, question orthostatic hypotension  Assessment & Plan    Chest pain HFpEF CAD s/p DES-RCA (2022 in [place]) Hypertension Orthostatic hypotension Chronic kidney disease  Estimated Creatinine Clearance: 30.9 mL/min (A) (by C-G formula based on SCr of 2.3 mg/dL (H)). (3D-4A) PAF on anticoagulation   Patient presents with chest pain.  Presumed noncardiac although occurred in the context of coronary disease. Continue carvedilol isosorbide and amlodipine, doses had to be down titrated because of orthostatic  hypotension. Continue Plavix and Eliquis without aspirin. Continue statin and zetia with plans to go to [facility] follow-up with insurance change; continue fish oils for now  -      For questions or updates, please contact [facility] Please consult [email or web address] for contact info under        Signed, Sherryl Manges, MD  05/03/2023, 1:46 PM

## 2023-05-07 NOTE — Discharge Summary (Signed)
Physician Discharge Summary   Patient: [name] MRN: [id number] DOB: Sep 12, 1961  Admit date:     04/15/2023  Discharge date: 05/03/2023  Discharge Physician: Charolotte Eke   PCP: Patient, No Pcp Per   Recommendations at discharge:  {Tip this will not be part of the note when signed- Example include specific recommendations for outpatient follow-up, pending tests to follow-up on. (Optional):26781}  ***  Discharge Diagnoses: Principal Problem:   Atypical chest pain Active Problems:   Mixed hyperlipidemia   Essential hypertension   CAD S/P percutaneous coronary angioplasty   Nausea   Acute kidney injury superimposed on chronic kidney disease (HCC)   Type 2 diabetes mellitus with hyperglycemia (HCC)   Persistent atrial fibrillation (HCC)   Obesity, Class III, BMI 40-49.9 (morbid obesity) (HCC)   Chest pain with high risk for cardiac etiology   RLQ abdominal pain   Unstable angina (HCC)   Stage 3b chronic kidney disease (HCC)   Type 2 diabetes mellitus with complication, with long-term current use of insulin (HCC)   Acute on chronic heart failure with preserved ejection fraction (HCC)   Chronic anemia   Chronic diastolic heart failure (HCC)   Orthostatic hypotension   Hyponatremia  Resolved Problems:   * No resolved hospital problems. *  Hospital Course: 62 y.o. female with medical history significant of CAD s/p PCI, hypertension, hyperlipidemia, T2DM, DVT on Eliquis who presents to the emergency department with complaints of chest pain.  Patient complained of left-sided chest pain which occurred yesterday while sitting in a car chest pain was described as sharp and nonreproducible and was rated as 8/10 on pain scale with radiation to the back, this was associated with dizziness and nausea without vomiting.  She states that she called her PCP who advised her to go to the ED for further evaluation and management.  Patient denies shortness of breath, diaphoresis, fever.  The  patient's troponins have been unremarkable.  Echocardiogram on 04/22/2023 showed EF 60 to 65%, trivial MR, normal RV EF.  However, patient continued to have intermittent chest pain throughout her hospitalization.  Her hospitalization was prolonged due to development of acute on chronic renal failure with serum creatinine up to 2.56.  Nephrology was consulted.  The patient was started on IV heparin for her persistent chest pain.  She received IV heparin for about a week and it was ultimately discontinued on 04/24/2023.  After discussion with nephrology, it was felt that the patient likely may have new renal baseline.  After discussion with the patient of the risk, benefits, and alternatives, all parties involved agreed to pursue left heart catheterization.  Assessment and Plan: No notes have been filed under this hospital service. Service: Hospitalist     {Tip this will not be part of the note when signed Body mass index is 42.74 kg/m. ,  Nutrition Documentation    Flowsheet Row ED to Hosp-Admission (Discharged) from 04/15/2023 in [facility]  Nutrition Problem Increased nutrient needs  Etiology acute illness  Nutrition Goal Patient will meet greater than or equal to 90% of their needs  Interventions MVI, Magic cup     ,  (Optional):26781}  {(NOTE) Pain control PDMP Statment (Optional):26782} Consultants: *** Procedures performed: ***  Disposition: {Plan; Disposition:26390} Diet recommendation:  Discharge Diet Orders (From admission, onward)     Start     Ordered   05/03/23 0000  Diet - low sodium heart healthy        05/03/23 1440           {  Diet_Plan:26776} DISCHARGE MEDICATION: Allergies as of 05/03/2023       Reactions   Solu-medrol [methylprednisolone Sodium Succ] Shortness Of Breath   Contrast Media [iodinated Contrast Media] Itching, Swelling   Doxycycline Hives   Ibuprofen Other (See Comments)   GI upset   Keflex [cephalexin] Nausea And Vomiting    Metformin And Related Nausea Only   Nitroglycerin Nausea And Vomiting   Nsaids Other (See Comments)   Stomach pain/bleeding   Shellfish Allergy Itching, Swelling   Sulfa Antibiotics Nausea And Vomiting   Toradol [ketorolac Tromethamine] Hives   Bentyl [dicyclomine Hcl] Anxiety   Blueberry Flavor Nausea And Vomiting, Rash   Nitrofurantoin Monohyd Macro Itching   Strawberry Flavor Nausea And Vomiting, Rash        Medication List     TAKE these medications    acetaminophen 325 MG tablet Commonly known as: TYLENOL Take by mouth.   amLODipine 2.5 MG tablet Commonly known as: NORVASC Take 1 tablet (2.5 mg total) by mouth daily.   apixaban 5 MG Tabs tablet Commonly known as: Eliquis Take 1 tablet (5 mg total) by mouth 2 (two) times daily.   atorvastatin 40 MG tablet Commonly known as: LIPITOR Take 40 mg by mouth every morning.   Banophen 25 mg capsule Generic drug: diphenhydrAMINE Take 25 mg by mouth at bedtime as needed for sleep.   carvedilol 25 MG tablet Commonly known as: COREG Take 0.5 tablets (12.5 mg total) by mouth 2 (two) times daily with a meal. What changed: how much to take   clopidogrel 75 MG tablet Commonly known as: PLAVIX Take 75 mg by mouth daily.   ezetimibe 10 MG tablet Commonly known as: ZETIA Take 1 tablet (10 mg total) by mouth daily.   gabapentin 300 MG capsule Commonly known as: NEURONTIN Take 300 mg by mouth 3 (three) times daily.   insulin detemir 100 UNIT/ML injection Commonly known as: LEVEMIR Inject 50 Units into the skin 2 (two) times daily.   isosorbide mononitrate 60 MG 24 hr tablet Commonly known as: IMDUR Take 1 tablet (60 mg total) by mouth every morning. What changed:  medication strength how much to take   lamoTRIgine 100 MG tablet Commonly known as: LAMICTAL Take 100 mg by mouth 2 (two) times daily.   NovoLOG FlexPen 100 UNIT/ML FlexPen Generic drug: insulin aspart Inject 20 Units into the skin 4 (four) times  daily.   ProAir HFA 108 (90 Base) MCG/ACT inhaler Generic drug: albuterol Inhale 1-2 puffs into the lungs every 6 (six) hours as needed for wheezing or shortness of breath.   sertraline 100 MG tablet Commonly known as: ZOLOFT Take 100 mg by mouth daily.        Discharge Exam: Filed Weights   05/01/23 0346 05/02/23 0244 05/03/23 0340  Weight: 109.9 kg 110 kg 109.5 kg   ***  Condition at discharge: {DC Condition:26389}  The results of significant diagnostics from this hospitalization (including imaging, microbiology, ancillary and laboratory) are listed below for reference.   Imaging Studies: CT HEAD CODE STROKE WO CONTRAST  Result Date: 04/25/2023 CLINICAL DATA:  Code stroke.  TIA.  Acute visual changes. EXAM: CT HEAD WITHOUT CONTRAST TECHNIQUE: Contiguous axial images were obtained from the base of the skull through the vertex without intravenous contrast. RADIATION DOSE REDUCTION: This exam was performed according to the departmental dose-optimization program which includes automated exposure control, adjustment of the mA and/or kV according to patient size and/or use of iterative reconstruction technique. COMPARISON:  CT head  without contrast 02/08/2023 FINDINGS: Brain: No acute infarct, hemorrhage, or mass lesion is present. The ventricles are of normal size. No significant extraaxial fluid collection is present. The brainstem and cerebellum are within normal limits. Midline structures are within normal limits. Vascular: Atherosclerotic calcifications are present within the cavernous internal carotid arteries. No hyperdense vessel is present. Skull: Calvarium is intact. No focal lytic or blastic lesions are present. No significant extracranial soft tissue lesion is present. Sinuses/Orbits: Polyps are again noted in the left maxillary sinus. Increased mucosal thickening is present. The globes and orbits are within normal limits. IMPRESSION: 1. Normal CT appearance of the brain. 2.  Increased mucosal thickening in the left maxillary sinus. The above was relayed via text pager to Dr. Viviann Spare on 04/25/2023 at 18:56 . Electronically Signed   By: Marin Roberts M.D.   On: 04/25/2023 18:56   CARDIAC CATHETERIZATION  Result Date: 04/25/2023   Hemodynamic findings consistent with mild pulmonary hypertension. Mild elevation of right heart pressures   CT RENAL STONE STUDY  Result Date: 04/22/2023 CLINICAL DATA:  Left flank pain, nausea EXAM: CT ABDOMEN AND PELVIS WITHOUT CONTRAST TECHNIQUE: Multidetector CT imaging of the abdomen and pelvis was performed following the standard protocol without IV contrast. RADIATION DOSE REDUCTION: This exam was performed according to the departmental dose-optimization program which includes automated exposure control, adjustment of the mA and/or kV according to patient size and/or use of iterative reconstruction technique. COMPARISON:  04/18/2023 FINDINGS: Lower chest: Interval development of bibasilar peribronchovascular ground-glass pulmonary infiltrates likely related to multifocal infection or noncardiogenic pulmonary edema. Trace bilateral pleural effusions. Extensive multi-vessel coronary artery calcification. Global cardiac size within normal limits. Hepatobiliary: No focal liver abnormality is seen. Status post cholecystectomy. No biliary dilatation. Pancreas: Unremarkable. No pancreatic ductal dilatation or surrounding inflammatory changes. Spleen: Mild splenomegaly is stable with the spleen measuring 15.6 cm in greatest dimension. This appears slightly progressive since remote prior examination 10/07/2022. No intrasplenic lesions identified on this noncontrast examination. Adrenals/Urinary Tract: The adrenal glands are unremarkable. The kidneys are normal in size and position. 1-2 mm punctate nonobstructing calculi are noted within the interpolar and lower polar regions of the kidneys bilaterally. No hydronephrosis. Minimal nonspecific bilateral  perinephric stranding. No perinephric fluid collections. No hydronephrosis. No ureteral calculi. The bladder is unremarkable. Stomach/Bowel: Moderate colonic stool burden without evidence of obstruction. Status post subtotal appendectomy. The stomach, small bowel, and large bowel are otherwise unremarkable. No evidence of obstruction or focal inflammation. No free intraperitoneal gas or fluid. Vascular/Lymphatic: Aortic atherosclerosis. No enlarged abdominal or pelvic lymph nodes. Reproductive: Status post hysterectomy. No adnexal masses. Other: No abdominal wall hernia. Musculoskeletal: Osseous structures are age-appropriate. No acute bone abnormality. No lytic or blastic bone lesion. IMPRESSION: 1. Interval development of bibasilar peribronchovascular ground-glass pulmonary infiltrates likely related to multifocal infection or noncardiogenic pulmonary edema. Trace bilateral pleural effusions. 2. Extensive multi-vessel coronary artery calcification. 3. Mild splenomegaly, progressive since remote prior examination of 10/07/2022. 4. Minimal bilateral nonobstructing nephrolithiasis. No urolithiasis. No hydronephrosis. 5. Moderate colonic stool burden without evidence of obstruction. 6. Aortic atherosclerosis. Aortic Atherosclerosis (ICD10-I70.0). Electronically Signed   By: Helyn Numbers M.D.   On: 04/22/2023 19:45   ECHOCARDIOGRAM LIMITED  Result Date: 04/22/2023    ECHOCARDIOGRAM LIMITED REPORT   Patient Name:   [name] Date of Exam: 04/22/2023 Medical Rec #:  [id number]      Height:       63.0 in Accession #:    [id number]     Weight:  245.0 lb Date of Birth:  02-Apr-1961      BSA:          2.108 m Patient Age:    61 years       BP:           117/62 mmHg Patient Gender: F              HR:           67 bpm. [facility] Procedure: Limited Echo, Cardiac Doppler and Color Doppler Indications:    CHF                 IVC check  History:        Patient has prior history of Echocardiogram  examinations, most                 recent 04/17/2023. CHF, CAD and Angina, Arrythmias:Atrial                 Fibrillation, Signs/Symptoms:Chest Pain; Risk                 Factors:Hypertension, Diabetes and Dyslipidemia. CKD.  Sonographer:    Mikki Harbor Referring Phys: [id number] VISHNU P MALLIPEDDI  Sonographer Comments: Patient is obese. IMPRESSIONS  1. Limited Echo for IVC check.  2. Left ventricular ejection fraction, by estimation, is 60 to 65%. The left ventricle has normal function. Left ventricular diastolic function could not be evaluated.  3. Right ventricular systolic function is normal. The right ventricular size is normal. There is normal pulmonary artery systolic pressure.  4. The inferior vena cava is normal in size with <50% respiratory variability, suggesting right atrial pressure of 8 mmHg. FINDINGS  Left Ventricle: Left ventricular ejection fraction, by estimation, is 60 to 65%. The left ventricle has normal function. The left ventricular internal cavity size was normal in size. There is no left ventricular hypertrophy. Left ventricular diastolic function could not be evaluated. Right Ventricle: The right ventricular size is normal. Right ventricular systolic function is normal. There is normal pulmonary artery systolic pressure. The tricuspid regurgitant velocity is 2.52 m/s, and with an assumed right atrial pressure of 8 mmHg,  the estimated right ventricular systolic pressure is 33.4 mmHg. Left Atrium: Left atrial size was not assessed. Right Atrium: Right atrial size was not assessed. Pericardium: There is no evidence of pericardial effusion. Mitral Valve: The mitral valve is normal in structure. Tricuspid Valve: The tricuspid valve is normal in structure. Tricuspid valve regurgitation is trivial. Aortic Valve: The aortic valve is tricuspid. There is mild calcification of the aortic valve. There is mild thickening of the aortic valve. There is mild aortic valve annular calcification. Pulmonic  Valve: The pulmonic valve was normal in structure. Aorta: The aortic root is normal in size and structure. Venous: The inferior vena cava is normal in size with less than 50% respiratory variability, suggesting right atrial pressure of 8 mmHg. IAS/Shunts: The interatrial septum was not assessed. LEFT VENTRICLE PLAX 2D LVIDd:         4.70 cm LVIDs:         3.00 cm LV PW:         1.20 cm LV IVS:        1.10 cm LVOT diam:     2.10 cm LVOT Area:     3.46 cm  LEFT ATRIUM         Index LA diam:    4.40 cm 2.09 cm/m   AORTA Ao  Root diam: 3.10 cm TRICUSPID VALVE TR Peak grad:   25.4 mmHg TR Vmax:        252.00 cm/s  SHUNTS Systemic Diam: 2.10 cm Vishnu Priya Mallipeddi Electronically signed by Winfield Rast Mallipeddi Signature Date/Time: 04/22/2023/5:07:38 PM    Final    DG CHEST PORT 1 VIEW  Result Date: 04/21/2023 CLINICAL DATA:  Chest pain EXAM: PORTABLE CHEST 1 VIEW COMPARISON:  04/20/2023 FINDINGS: The heart size and mediastinal contours are within normal limits. Both lungs are clear. The visualized skeletal structures are unremarkable. IMPRESSION: No active disease. Electronically Signed   By: Charlett Nose M.D.   On: 04/21/2023 16:58   DG CHEST PORT 1 VIEW  Result Date: 04/20/2023 CLINICAL DATA:  [id number] Chest pain [id number] EXAM: PORTABLE CHEST - 1 VIEW COMPARISON:  04/15/2023 FINDINGS: Relatively low lung volumes with some crowding of bronchovascular structures centrally. No new infiltrate. No pneumothorax. Heart size and mediastinal contours are within normal limits. No effusion. Visualized bones unremarkable. IMPRESSION: Low lung volumes. No acute findings. Electronically Signed   By: Corlis Leak M.D.   On: 04/20/2023 10:03   US RENAL  Result Date: 04/19/2023 CLINICAL DATA:  Acute on chronic kidney injury EXAM: RENAL / URINARY TRACT ULTRASOUND COMPLETE COMPARISON:  CT abdomen pelvis 04/18/2023 FINDINGS: Right Kidney: Renal measurements: 9.4 x 4.9 x 4.4 cm = volume: 105 mL. Echogenicity within normal limits. No  mass or hydronephrosis visualized. Left Kidney: Renal measurements: 11.6 x 5.6 x 5.0 cm = volume: 169 mL. Echogenicity within normal limits. No mass or hydronephrosis visualized. Bladder: Limited evaluation due to to collapsed configuration. Other: Diffuse increased echogenicity of the visualized portions of the hepatic parenchyma are a nonspecific indicator of hepatocellular dysfunction, most commonly steatosis. IMPRESSION: No significant sonographic abnormality of the kidneys. Electronically Signed   By: Acquanetta Belling M.D.   On: 04/19/2023 10:55   CT ABDOMEN PELVIS WO CONTRAST  Result Date: 04/18/2023 CLINICAL DATA:  Right lower quadrant abdominal pain EXAM: CT ABDOMEN AND PELVIS WITHOUT CONTRAST TECHNIQUE: Multidetector CT imaging of the abdomen and pelvis was performed following the standard protocol without IV contrast. RADIATION DOSE REDUCTION: This exam was performed according to the departmental dose-optimization program which includes automated exposure control, adjustment of the mA and/or kV according to patient size and/or use of iterative reconstruction technique. COMPARISON:  None Available. FINDINGS: Lower chest: No acute findings. Coronary artery and aortic calcifications. Hepatobiliary: Prior cholecystectomy. Diffuse fatty infiltration of the liver. No focal hepatic abnormality. Mild hepatomegaly with craniocaudal length 23 cm. Pancreas: No focal abnormality or ductal dilatation. Spleen: Splenomegaly with a craniocaudal length of 14.5 cm. This is stable since prior study. No focal abnormality. Adrenals/Urinary Tract: Punctate bilateral nonobstructing renal calculi. No ureteral stones or hydronephrosis. Adrenal glands and urinary bladder unremarkable. Stomach/Bowel: Stomach, large and small bowel grossly unremarkable. Vascular/Lymphatic: Aortic atherosclerosis. No evidence of aneurysm or adenopathy. Reproductive: Prior hysterectomy.  No adnexal masses. Other: No free fluid or free air.  Musculoskeletal: No acute bony abnormality. IMPRESSION: Hepatic steatosis.  Hepatosplenomegaly. No acute findings in the abdomen or pelvis. Aortic atherosclerosis, coronary artery disease. Punctate bilateral nephrolithiasis.  No hydronephrosis. Electronically Signed   By: Charlett Nose M.D.   On: 04/18/2023 19:22   ECHOCARDIOGRAM COMPLETE  Result Date: 04/17/2023    ECHOCARDIOGRAM REPORT   Patient Name:   [name] Date of Exam: 04/17/2023 Medical Rec #:  [id number]      Height:       63.0 in Accession #:  [id number]     Weight:       245.0 lb Date of Birth:  September 13, 1961      BSA:          2.108 m Patient Age:    61 years       BP:           136/73 mmHg Patient Gender: F              HR:           71 bpm. [facility] Procedure: 2D Echo, Cardiac Doppler and Color Doppler Indications:    Chest Pain R07.9  History:        Patient has no prior history of Echocardiogram examinations.                 CAD, Arrythmias:Atrial Fibrillation, Signs/Symptoms:Chest Pain;                 Risk Factors:Dyslipidemia, Hypertension and Diabetes.  Sonographer:    Aron Baba Referring Phys: [id number] Ellsworth Lennox  Sonographer Comments: Patient is obese. IMPRESSIONS  1. Left ventricular ejection fraction, by estimation, is 55 to 60%. The left ventricle has normal function. The left ventricle has no regional wall motion abnormalities. Left ventricular diastolic parameters are consistent with Grade I diastolic dysfunction (impaired relaxation).  2. Right ventricular systolic function is normal. The right ventricular size is normal. Tricuspid regurgitation signal is inadequate for assessing PA pressure.  3. The mitral valve is abnormal. Mild mitral valve regurgitation. No evidence of mitral stenosis.  4. The aortic valve is tricuspid. Aortic valve regurgitation is not visualized. No aortic stenosis is present.  5. Aortic dilatation noted. 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  6. The  inferior vena cava is normal in size with greater than 50% respiratory variability, suggesting right atrial pressure of 3 mmHg. FINDINGS  Left Ventricle: Left ventricular ejection fraction, by estimation, is 55 to 60%. The left ventricle has normal function. The left ventricle has no regional wall motion abnormalities. The left ventricular internal cavity size was normal in size. There is  no left ventricular hypertrophy. Left ventricular diastolic parameters are consistent with Grade I diastolic dysfunction (impaired relaxation). Normal left ventricular filling pressure. Right Ventricle: The right ventricular size is normal. Right vetricular wall thickness was not well visualized. Right ventricular systolic function is normal. Tricuspid regurgitation signal is inadequate for assessing PA pressure. Left Atrium: Left atrial size was normal in size. Right Atrium: Right atrial size was normal in size. Pericardium: There is no evidence of pericardial effusion. Mitral Valve: The mitral valve is abnormal. Mild mitral valve regurgitation. No evidence of mitral valve stenosis. Tricuspid Valve: The tricuspid valve is normal in structure. Tricuspid valve regurgitation is trivial. No evidence of tricuspid stenosis. Aortic Valve: The aortic valve is tricuspid. Aortic valve regurgitation is not visualized. No aortic stenosis is present. Aortic valve mean gradient measures 5.4 mmHg. Aortic valve peak gradient measures 8.7 mmHg. Aortic valve area, by VTI measures 1.85 cm. Pulmonic Valve: The pulmonic valve was not well visualized. Pulmonic valve regurgitation is not visualized. No evidence of pulmonic stenosis. Aorta: The aortic root is normal in size and structure and aortic dilatation noted. 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 Venous: The inferior vena cava is normal in size with greater than 50% respiratory variability, suggesting right atrial pressure of 3 mmHg. IAS/Shunts: No atrial level shunt  detected by color  flow Doppler.  LEFT VENTRICLE PLAX 2D LVIDd:         5.00 cm   Diastology LVIDs:         3.60 cm   LV e' medial:    13.20 cm/s LV PW:         0.90 cm   LV E/e' medial:  5.8 LV IVS:        0.80 cm   LV e' lateral:   6.22 cm/s LVOT diam:     2.00 cm   LV E/e' lateral: 12.3 LV SV:         67 LV SV Index:   32 LVOT Area:     3.14 cm  RIGHT VENTRICLE RV S prime:     12.60 cm/s TAPSE (M-mode): 2.2 cm LEFT ATRIUM             Index        RIGHT ATRIUM           Index LA diam:        4.00 cm 1.90 cm/m   RA Area:     10.10 cm LA Vol (A2C):   69.3 ml 32.88 ml/m  RA Volume:   20.40 ml  9.68 ml/m LA Vol (A4C):   65.9 ml 31.26 ml/m LA Biplane Vol: 70.8 ml 33.59 ml/m  AORTIC VALVE AV Area (Vmax):    1.94 cm AV Area (Vmean):   1.69 cm AV Area (VTI):     1.85 cm AV Vmax:           147.81 cm/s AV Vmean:          109.081 cm/s AV VTI:            0.360 m AV Peak Grad:      8.7 mmHg AV Mean Grad:      5.4 mmHg LVOT Vmax:         91.34 cm/s LVOT Vmean:        58.622 cm/s LVOT VTI:          0.212 m LVOT/AV VTI ratio: 0.59  AORTA Ao Root diam: 3.40 cm Ao Asc diam:  3.60 cm MITRAL VALVE MV Area (PHT): 4.71 cm     SHUNTS MV Decel Time: 161 msec     Systemic VTI:  0.21 m MR Peak grad: 82.2 mmHg     Systemic Diam: 2.00 cm MR Vmax:      453.33 cm/s MV E velocity: 76.60 cm/s MV A velocity: 108.00 cm/s MV E/A ratio:  0.71 Dina Rich MD Electronically signed by Dina Rich MD Signature Date/Time: 04/17/2023/1:33:26 PM    Final    CT CHEST ABDOMEN PELVIS WO CONTRAST  Result Date: 04/16/2023 CLINICAL DATA:  Sharp chest pain radiating to back. Left-sided rib pain and right-sided abdominal pain. EXAM: CT CHEST, ABDOMEN AND PELVIS WITHOUT CONTRAST TECHNIQUE: Multidetector CT imaging of the chest, abdomen and pelvis was performed following the standard protocol without IV contrast. RADIATION DOSE REDUCTION: This exam was performed according to the departmental dose-optimization program which includes automated  exposure control, adjustment of the mA and/or kV according to patient size and/or use of iterative reconstruction technique. COMPARISON:  10/07/2022. FINDINGS: CT CHEST FINDINGS Cardiovascular: The heart is normal in size and there is no pericardial effusion. Three-vessel coronary artery calcifications are noted. There is atherosclerotic calcification of the aorta without evidence of aneurysm. The pulmonary trunk is normal in caliber. Mediastinum/Nodes: No mediastinal or hilar lymphadenopathy. Evaluation of the hila is limited due to  lack of IV contrast. Coarse calcification is noted in the left lobe of the thyroid gland. The trachea and esophagus are within normal limits. Lungs/Pleura: Mild atelectasis is present bilaterally. No effusion or pneumothorax. Small calcified granuloma are present bilaterally. Musculoskeletal: Degenerative changes are present in the thoracic spine. No acute fracture is seen. CT ABDOMEN PELVIS FINDINGS Hepatobiliary: No focal liver abnormality is seen. The liver is mildly enlarged. No gallstones, gallbladder wall thickening, or biliary dilatation. Pancreas: Unremarkable. No pancreatic ductal dilatation or surrounding inflammatory changes. Spleen: The spleen is enlarged at 14.1 cm in length. No focal abnormality. Adrenals/Urinary Tract: The adrenal glands are within normal limits. A nonobstructive renal calculus is noted on the right. No hydroureteronephrosis bilaterally. The bladder is unremarkable. Stomach/Bowel: Stomach is within normal limits. Appendix is surgically absent. No evidence of bowel wall thickening, distention, or inflammatory changes. No free air or pneumatosis. Vascular/Lymphatic: Aortic atherosclerosis. No enlarged abdominal or pelvic lymph nodes. Reproductive: Status post hysterectomy. No adnexal masses. Other: No abdominopelvic ascites. Subcutaneous fat stranding is noted in the anterior abdominal wall in the left lower quadrant, unchanged from the prior exam.  Musculoskeletal: Degenerative changes are present in the lumbar spine. No acute fracture. IMPRESSION: 1. No acute process in the chest, abdomen, or pelvis. 2. Nonobstructive right renal calculus. 3. Mild hepatosplenomegaly. 4. Subcutaneous fat stranding in the anterior abdominal wall in the left lower quadrant, unchanged from the prior exam, possible cellulitis. 5. Coronary artery calcifications. 6. Aortic atherosclerosis. Electronically Signed   By: Thornell Sartorius M.D.   On: 04/16/2023 00:19   DG Chest 2 View  Result Date: 04/15/2023 CLINICAL DATA:  Left-sided chest pain for 2 days EXAM: CHEST - 2 VIEW COMPARISON:  03/30/2023 FINDINGS: Frontal and lateral views of the chest demonstrate an unremarkable cardiac silhouette. No airspace disease, effusion, or pneumothorax. No acute bony abnormality. IMPRESSION: 1. No acute intrathoracic process. Electronically Signed   By: Sharlet Salina M.D.   On: 04/15/2023 22:33    Microbiology: Results for orders placed or performed during the hospital encounter of 04/15/23  Respiratory (~20 pathogens) panel by PCR     Status: None   Collection Time: 04/23/23  3:13 AM   Specimen: Nasopharyngeal Swab; Respiratory  Result Value Ref Range Status   Adenovirus NOT DETECTED NOT DETECTED Final   Coronavirus 229E NOT DETECTED NOT DETECTED Final    Comment: (NOTE) The Coronavirus on the Respiratory Panel, DOES NOT test for the novel  Coronavirus (2019 nCoV)    Coronavirus HKU1 NOT DETECTED NOT DETECTED Final   Coronavirus NL63 NOT DETECTED NOT DETECTED Final   Coronavirus OC43 NOT DETECTED NOT DETECTED Final   Metapneumovirus NOT DETECTED NOT DETECTED Final   Rhinovirus / Enterovirus NOT DETECTED NOT DETECTED Final   Influenza A NOT DETECTED NOT DETECTED Final   Influenza B NOT DETECTED NOT DETECTED Final   Parainfluenza Virus 1 NOT DETECTED NOT DETECTED Final   Parainfluenza Virus 2 NOT DETECTED NOT DETECTED Final   Parainfluenza Virus 3 NOT DETECTED NOT DETECTED  Final   Parainfluenza Virus 4 NOT DETECTED NOT DETECTED Final   Respiratory Syncytial Virus NOT DETECTED NOT DETECTED Final   Bordetella pertussis NOT DETECTED NOT DETECTED Final   Bordetella Parapertussis NOT DETECTED NOT DETECTED Final   Chlamydophila pneumoniae NOT DETECTED NOT DETECTED Final   Mycoplasma pneumoniae NOT DETECTED NOT DETECTED Final    Comment: Performed at [facility], [address], [place], [place] [address]  SARS Coronavirus 2 by RT PCR (hospital order,  performed in [facility] hospital lab) *cepheid single result test* Anterior Nasal Swab     Status: None   Collection Time: 04/23/23  5:31 PM   Specimen: Anterior Nasal Swab  Result Value Ref Range Status   SARS Coronavirus 2 by RT PCR NEGATIVE NEGATIVE Final    Comment: (NOTE) SARS-CoV-2 target nucleic acids are NOT DETECTED.  The SARS-CoV-2 RNA is generally detectable in upper and lower respiratory specimens during the acute phase of infection. The lowest concentration of SARS-CoV-2 viral copies this assay can detect is 250 copies / mL. A negative result does not preclude SARS-CoV-2 infection and should not be used as the sole basis for treatment or other patient management decisions.  A negative result may occur with improper specimen collection / handling, submission of specimen other than nasopharyngeal swab, presence of viral mutation(s) within the areas targeted by this assay, and inadequate number of viral copies (<250 copies / mL). A negative result must be combined with clinical observations, patient history, and epidemiological information.  Fact Sheet for Patients:   RoadLapTop.co.za  Fact Sheet for Healthcare Providers: http://kim-miller.com/  This test is not yet approved or  cleared by the [place] FDA and has been authorized for detection and/or diagnosis of SARS-CoV-2 by FDA under an Emergency Use Authorization (EUA).  This EUA will  remain in effect (meaning this test can be used) for the duration of the COVID-19 declaration under Section 564(b)(1) of the Act, 21 U.S.C. section 360bbb-3(b)(1), unless the authorization is terminated or revoked sooner.  Performed at [facility], [address], [place], [place] [address]     Labs: CBC: No results for input(s): "WBC", "NEUTROABS", "HGB", "HCT", "MCV", "PLT" in the last 168 hours. Basic Metabolic Panel: Recent Labs  Lab 05/01/23 0724 05/02/23 0327 05/03/23 0141  NA 133* 129* 132*  K 3.5 3.5 3.7  CL 91* 90* 91*  CO2 28 25 25   GLUCOSE 179* 264* 324*  BUN 37* 42* 43*  CREATININE 2.65* 2.42* 2.30*  CALCIUM 8.5* 8.4* 8.6*  PHOS 4.7* 3.9 4.5   Liver Function Tests: Recent Labs  Lab 05/01/23 0724 05/02/23 0327 05/03/23 0141  ALBUMIN 3.3* 3.1* 3.1*   CBG: Recent Labs  Lab 05/02/23 1534 05/02/23 2116 05/03/23 0338 05/03/23 0803 05/03/23 1228  GLUCAP 200* 222* 268* 242* 439*    Discharge time spent: {LESS THAN/GREATER THAN:26388} 30 minutes.  Signed: Charolotte Eke, MD [facility] 05/07/2023

## 2023-05-28 ENCOUNTER — Emergency Department (HOSPITAL_COMMUNITY): Payer: Medicaid - Out of State

## 2023-05-28 ENCOUNTER — Emergency Department (HOSPITAL_COMMUNITY)
Admission: EM | Admit: 2023-05-28 | Discharge: 2023-05-29 | Disposition: A | Discharge status: Home / Self Care | Payer: Medicaid - Out of State | Attending: Emergency Medicine | Admitting: Emergency Medicine

## 2023-05-28 ENCOUNTER — Other Ambulatory Visit: Payer: Self-pay

## 2023-05-28 ENCOUNTER — Encounter (HOSPITAL_COMMUNITY): Payer: Self-pay | Admitting: Radiology

## 2023-05-28 DIAGNOSIS — R531 Weakness: Secondary | ICD-10-CM | POA: Diagnosis present

## 2023-05-28 DIAGNOSIS — N1832 Chronic kidney disease, stage 3b: Secondary | ICD-10-CM | POA: Insufficient documentation

## 2023-05-28 DIAGNOSIS — E1122 Type 2 diabetes mellitus with diabetic chronic kidney disease: Secondary | ICD-10-CM | POA: Diagnosis not present

## 2023-05-28 DIAGNOSIS — Z20822 Contact with and (suspected) exposure to covid-19: Secondary | ICD-10-CM | POA: Insufficient documentation

## 2023-05-28 DIAGNOSIS — I129 Hypertensive chronic kidney disease with stage 1 through stage 4 chronic kidney disease, or unspecified chronic kidney disease: Secondary | ICD-10-CM | POA: Insufficient documentation

## 2023-05-28 DIAGNOSIS — Z7901 Long term (current) use of anticoagulants: Secondary | ICD-10-CM | POA: Insufficient documentation

## 2023-05-28 DIAGNOSIS — J45909 Unspecified asthma, uncomplicated: Secondary | ICD-10-CM | POA: Diagnosis not present

## 2023-05-28 DIAGNOSIS — R0789 Other chest pain: Secondary | ICD-10-CM | POA: Diagnosis not present

## 2023-05-28 DIAGNOSIS — Z7722 Contact with and (suspected) exposure to environmental tobacco smoke (acute) (chronic): Secondary | ICD-10-CM | POA: Insufficient documentation

## 2023-05-28 DIAGNOSIS — Z79899 Other long term (current) drug therapy: Secondary | ICD-10-CM | POA: Diagnosis not present

## 2023-05-28 DIAGNOSIS — M791 Myalgia, unspecified site: Secondary | ICD-10-CM | POA: Diagnosis not present

## 2023-05-28 DIAGNOSIS — Z794 Long term (current) use of insulin: Secondary | ICD-10-CM | POA: Diagnosis not present

## 2023-05-28 DIAGNOSIS — R52 Pain, unspecified: Secondary | ICD-10-CM

## 2023-05-28 DIAGNOSIS — N3090 Cystitis, unspecified without hematuria: Secondary | ICD-10-CM | POA: Diagnosis not present

## 2023-05-28 LAB — URINALYSIS, ROUTINE W REFLEX MICROSCOPIC
Bilirubin Urine: NEGATIVE
Glucose, UA: NEGATIVE mg/dL
Hgb urine dipstick: NEGATIVE
Ketones, ur: NEGATIVE mg/dL
Nitrite: NEGATIVE
Protein, ur: 30 mg/dL — AB
Specific Gravity, Urine: 1.01 (ref 1.005–1.030)
pH: 5 (ref 5.0–8.0)

## 2023-05-28 LAB — CBC
HCT: 32.2 % — ABNORMAL LOW (ref 36.0–46.0)
Hemoglobin: 11 g/dL — ABNORMAL LOW (ref 12.0–15.0)
MCH: 29.5 pg (ref 26.0–34.0)
MCHC: 34.2 g/dL (ref 30.0–36.0)
MCV: 86.3 fL (ref 80.0–100.0)
Platelets: 174 10*3/uL (ref 150–400)
RBC: 3.73 MIL/uL — ABNORMAL LOW (ref 3.87–5.11)
RDW: 16.9 % — ABNORMAL HIGH (ref 11.5–15.5)
WBC: 5.3 10*3/uL (ref 4.0–10.5)
nRBC: 0 % (ref 0.0–0.2)

## 2023-05-28 LAB — BASIC METABOLIC PANEL
Anion gap: 11 (ref 5–15)
BUN: 27 mg/dL — ABNORMAL HIGH (ref 6–20)
CO2: 20 mmol/L — ABNORMAL LOW (ref 22–32)
Calcium: 8.9 mg/dL (ref 8.9–10.3)
Chloride: 101 mmol/L (ref 98–111)
Creatinine, Ser: 1.92 mg/dL — ABNORMAL HIGH (ref 0.44–1.00)
GFR, Estimated: 29 mL/min — ABNORMAL LOW (ref >60)
Glucose, Bld: 250 mg/dL — ABNORMAL HIGH (ref 70–99)
Potassium: 4.1 mmol/L (ref 3.5–5.1)
Sodium: 132 mmol/L — ABNORMAL LOW (ref 135–145)

## 2023-05-28 LAB — TROPONIN I (HIGH SENSITIVITY)
Troponin I (High Sensitivity): 4 ng/L (ref <18)
Troponin I (High Sensitivity): 4 ng/L (ref <18)

## 2023-05-28 LAB — HEPATIC FUNCTION PANEL
ALT: 16 U/L (ref 0–44)
AST: 14 U/L — ABNORMAL LOW (ref 15–41)
Albumin: 3.4 g/dL — ABNORMAL LOW (ref 3.5–5.0)
Alkaline Phosphatase: 69 U/L (ref 38–126)
Bilirubin, Direct: <0.1 mg/dL (ref 0.0–0.2)
Total Bilirubin: 0.6 mg/dL (ref 0.3–1.2)
Total Protein: 8.9 g/dL — ABNORMAL HIGH (ref 6.5–8.1)

## 2023-05-28 LAB — BRAIN NATRIURETIC PEPTIDE: B Natriuretic Peptide: 38 pg/mL (ref 0.0–100.0)

## 2023-05-28 LAB — RESP PANEL BY RT-PCR (RSV, FLU A&B, COVID)  RVPGX2
Influenza A by PCR: NEGATIVE
Influenza B by PCR: NEGATIVE
Resp Syncytial Virus by PCR: NEGATIVE
SARS Coronavirus 2 by RT PCR: NEGATIVE

## 2023-05-28 MED ORDER — MORPHINE SULFATE (PF) 4 MG/ML IV SOLN
4.0000 mg | Freq: Once | INTRAVENOUS | Status: AC
  Administered 2023-05-28: 4 mg via INTRAVENOUS
  Filled 2023-05-28: qty 1

## 2023-05-28 MED ORDER — SODIUM CHLORIDE 0.9 % IV BOLUS
500.0000 mL | Freq: Once | INTRAVENOUS | Status: AC
  Administered 2023-05-28: 500 mL via INTRAVENOUS

## 2023-05-28 MED ORDER — ONDANSETRON HCL 4 MG/2ML IJ SOLN
4.0000 mg | Freq: Once | INTRAMUSCULAR | Status: AC
  Administered 2023-05-28: 4 mg via INTRAVENOUS
  Filled 2023-05-28: qty 2

## 2023-05-28 NOTE — ED Provider Notes (Signed)
[place] EMERGENCY DEPARTMENT AT [facility] Provider Note  CSN: [id number] Arrival date & time: 05/28/23 1841  Chief Complaint(s) Weakness and Chest Pain  HPI [name] is a 62 y.o. female with past medical history as below, significant for fibrillation, bipolar 1 disorder, DM, HLD, HTN, IBS, CKD 3BF HFpEF who presents to the ED with complaint of chest pain, weakness, dyspnea.   Reports she has been having cough, congestion, sneezing over the past week.  Over the past 3 days she has been having intermittent chest tightness, exertional dyspnea, fatigue, lower abdominal pain, right-sided flank pain.  Increased urination, body aches.  Leg pain bilateral. Occ chest pain, sharp, reproducible on palpation, mid sternal. Feels lightheaded when she stands or walks.  No syncope.  Increased urination and urgency.  Suprapubic and right lower quadrant/right flank pain.  Poor p.o. intake last few days.  Compliant with home medications.   Similar symptoms 2 months ago that prompted admission.  She had right heart cath 04/25/23; HFpEF, pulmonary hypertension.  Echocardiogram 8/624 with LVEF 60 - 6 5%   Past Medical History Past Medical History:  Diagnosis Date   Asthma    Atrial fibrillation (HCC)    Bipolar 1 disorder (HCC)    Chronic back pain    Chronic chest pain    Diabetes mellitus without complication (HCC)    Gastroparesis    GERD (gastroesophageal reflux disease)    Hyperlipemia    Hypertension    IBS (irritable bowel syndrome)    Normal cardiac stress test 02/2014   [facility]   Patient Active Problem List   Diagnosis Date Noted   Orthostatic hypotension 05/02/2023   Hyponatremia 05/02/2023   Chronic diastolic heart failure (HCC) 04/23/2023   Unstable angina (HCC) 04/22/2023   Stage 3b chronic kidney disease (HCC) 04/22/2023   Type 2 diabetes mellitus with complication, with long-term current use of insulin (HCC) 04/22/2023   Acute on chronic heart failure with  preserved ejection fraction (HCC) 04/22/2023   Chronic anemia 04/22/2023   RLQ abdominal pain 04/18/2023   Chest pain with high risk for cardiac etiology 04/17/2023   Atypical chest pain 04/16/2023   Nausea 04/16/2023   Acute kidney injury superimposed on chronic kidney disease (HCC) 04/16/2023   Type 2 diabetes mellitus with hyperglycemia (HCC) 04/16/2023   Persistent atrial fibrillation (HCC) 04/16/2023   Obesity, Class III, BMI 40-49.9 (morbid obesity) (HCC) 04/16/2023   Erroneous encounter - disregard 02/14/2023   Left-sided weakness 02/08/2023   BRBPR (bright red blood per rectum) 04/07/2012   GASTRIC POLYP 10/21/2006   Mixed hyperlipidemia 10/21/2006   DISORDER, BIPOLAR NOS 10/21/2006   DEPRESSION 10/21/2006   MIGRAINE HEADACHE 10/21/2006   Essential hypertension 10/21/2006   CAD S/P percutaneous coronary angioplasty 10/21/2006   GERD 10/21/2006   LOW BACK PAIN 10/21/2006   Home Medication(s) Prior to Admission medications   Medication Sig Start Date End Date Taking? Authorizing Provider  acetaminophen (TYLENOL) 325 MG tablet Take 325 mg by mouth every 4 (four) hours as needed for mild pain, headache or fever. 05/01/21  Yes [provider]  albuterol (PROAIR HFA) 108 (90 BASE) MCG/ACT inhaler Inhale 1-2 puffs into the lungs every 6 (six) hours as needed for wheezing or shortness of breath.   Yes [provider]  apixaban (ELIQUIS) 5 MG TABS tablet Take 1 tablet (5 mg total) by mouth 2 (two) times daily. 11/17/17  Yes Bethann Berkshire, MD  atorvastatin (LIPITOR) 40 MG tablet Take 40 mg by  mouth every morning.   Yes [provider]  carvedilol (COREG) 25 MG tablet Take 0.5 tablets (12.5 mg total) by mouth 2 (two) times daily with a meal. Patient taking differently: Take 12.5 mg by mouth daily with breakfast. 05/03/23  Yes Charolotte Eke, MD  clopidogrel (PLAVIX) 75 MG tablet Take 75 mg by mouth daily.   Yes [provider]  diphenhydrAMINE (BENADRYL)  25 MG tablet Take 25 mg by mouth at bedtime as needed for sleep. 04/25/21  Yes [provider]  gabapentin (NEURONTIN) 300 MG capsule Take 300 mg by mouth 3 (three) times daily.   Yes [provider]  insulin detemir (LEVEMIR) 100 UNIT/ML injection Inject 50 Units into the skin 2 (two) times daily.   Yes [provider]  isosorbide mononitrate (IMDUR) 60 MG 24 hr tablet Take 1 tablet (60 mg total) by mouth every morning. 05/04/23  Yes Charolotte Eke, MD  levofloxacin (LEVAQUIN) 250 MG tablet Take 1 tablet (250 mg total) by mouth daily for 2 days. 05/29/23 05/31/23 Yes Tanda Rockers A, DO  losartan (COZAAR) 25 MG tablet Take 25 mg by mouth daily. 12/19/22  Yes [provider]  NOVOLOG FLEXPEN 100 UNIT/ML FlexPen Inject 20 Units into the skin 4 (four) times daily. 4-5 times daily 05/13/15  Yes [provider]  sertraline (ZOLOFT) 100 MG tablet Take 100 mg by mouth daily.   Yes [provider]  trazodone (DESYREL) 300 MG tablet Take 300 mg by mouth at bedtime. 03/03/23  Yes [provider]                                                                                                                                    Past Surgical History Past Surgical History:  Procedure Laterality Date   ABDOMINAL HYSTERECTOMY  2001   APPENDECTOMY  10/2005   CHOLECYSTECTOMY  10/2005   COLONOSCOPY  2007   Patel ([name]pt reports hemorrhoids   ESOPHAGOGASTRODUODENOSCOPY  02/28/2006   [name], normal on daily PPI, myultiple hyperplastic polyps   MOUTH SURGERY     RIGHT HEART CATH N/A 04/25/2023   Procedure: RIGHT HEART CATH;  Surgeon: Kathleene Hazel, MD;  Location: [facility] INVASIVE CV LAB;  Service: Cardiovascular;  Laterality: N/A;   RIGHT OOPHORECTOMY  1999   Family History Family History  Problem Relation Age of Onset   Cirrhosis Mother 52       ?etiology   Cirrhosis Father        ?etiology   Cirrhosis Sister 37       ?etiology    Diverticulitis Sister     Social History Social History   Tobacco Use   Smoking status: Passive Smoke Exposure - Never Smoker   Smokeless tobacco: Never  Vaping Use   Vaping status: Never Used  Substance Use Topics   Alcohol use: No   Drug use: No   Allergies Gelatin, Iodinated contrast  media, Methylprednisolone sodium succ, Dicyclomine hcl, Doxycycline, Glutamic acid, Ketorolac, Lisinopril, Metformin, Nitroglycerin, Metformin and related, Nsaids, Other, Quetiapine, Vancomycin, Blueberry flavor, Cephalexin, Dicyclomine, Fish allergy, Ibuprofen, Ketorolac tromethamine, Nitrofuran derivatives, Nitrofurantoin, Nitrofurantoin monohyd macro, Shellfish allergy, Strawberry extract, Strawberry flavor, and Sulfa antibiotics  Review of Systems Review of Systems  Constitutional:  Positive for appetite change and fatigue.  HENT:  Positive for congestion and sneezing.   Respiratory:  Positive for cough, chest tightness and shortness of breath.   Cardiovascular:  Positive for chest pain and palpitations.  Gastrointestinal:  Positive for abdominal pain and nausea. Negative for vomiting.  Genitourinary:  Positive for flank pain, frequency and urgency.  Musculoskeletal:  Positive for arthralgias. Negative for gait problem.  Neurological:  Positive for weakness.  All other systems reviewed and are negative.   Physical Exam Vital Signs  I have reviewed the triage vital signs BP 134/61   Pulse 73   Temp 98.2 F (36.8 C) (Oral)   Resp 14   Ht 5\' 3"  (1.6 m)   Wt 111.1 kg   SpO2 98%   BMI 43.40 kg/m  Physical Exam Vitals and nursing note reviewed.  Constitutional:      General: She is not in acute distress.    Appearance: Normal appearance. She is well-developed. She is obese. She is not ill-appearing.  HENT:     Head: Normocephalic and atraumatic.     Right Ear: External ear normal.     Left Ear: External ear normal.     Nose: Nose normal.     Mouth/Throat:     Mouth: Mucous  membranes are moist.  Eyes:     General: No scleral icterus.       Right eye: No discharge.        Left eye: No discharge.  Cardiovascular:     Rate and Rhythm: Normal rate and regular rhythm.     Pulses: Normal pulses.     Heart sounds: Normal heart sounds.  Pulmonary:     Effort: Pulmonary effort is normal. No respiratory distress.     Breath sounds: Normal breath sounds. No stridor. No wheezing.  Abdominal:     General: Abdomen is flat. There is no distension.     Palpations: Abdomen is soft.     Tenderness: There is no abdominal tenderness.  Musculoskeletal:     Cervical back: No rigidity.     Right lower leg: Edema present.     Left lower leg: Edema present.  Skin:    General: Skin is warm and dry.     Capillary Refill: Capillary refill takes less than 2 seconds.  Neurological:     Mental Status: She is alert.  Psychiatric:        Mood and Affect: Mood normal.        Behavior: Behavior normal. Behavior is cooperative.     ED Results and Treatments Labs (all labs ordered are listed, but only abnormal results are displayed) Labs Reviewed  BASIC METABOLIC PANEL - Abnormal; Notable for the following components:      Result Value   Sodium 132 (*)    CO2 20 (*)    Glucose, Bld 250 (*)    BUN 27 (*)    Creatinine, Ser 1.92 (*)    GFR, Estimated 29 (*)    All other components within normal limits  CBC - Abnormal; Notable for the following components:   RBC 3.73 (*)    Hemoglobin 11.0 (*)    HCT 32.2 (*)  RDW 16.9 (*)    All other components within normal limits  HEPATIC FUNCTION PANEL - Abnormal; Notable for the following components:   Total Protein 8.9 (*)    Albumin 3.4 (*)    AST 14 (*)    All other components within normal limits  URINALYSIS, ROUTINE W REFLEX MICROSCOPIC - Abnormal; Notable for the following components:   APPearance CLOUDY (*)    Protein, ur 30 (*)    Leukocytes,Ua SMALL (*)    Bacteria, UA RARE (*)    All other components within normal  limits  RESP PANEL BY RT-PCR (RSV, FLU A&B, COVID)  RVPGX2  BRAIN NATRIURETIC PEPTIDE  TROPONIN I (HIGH SENSITIVITY)  TROPONIN I (HIGH SENSITIVITY)                                                                                                                          Radiology No results found.  Pertinent labs & imaging results that were available during my care of the patient were reviewed by me and considered in my medical decision making (see MDM for details).  Medications Ordered in ED Medications  morphine (PF) 4 MG/ML injection 4 mg (4 mg Intravenous Given 05/28/23 2104)  ondansetron (ZOFRAN) injection 4 mg (4 mg Intravenous Given 05/28/23 2102)  sodium chloride 0.9 % bolus 500 mL (0 mLs Intravenous Stopped 05/28/23 2248)  levofloxacin (LEVAQUIN) tablet 250 mg (250 mg Oral Given 05/29/23 0024)                                                                                                                                     Procedures Procedures  (including critical care time)  Medical Decision Making / ED Course    Medical Decision Making:    Luwam Currie is a 62 y.o. female with past medical history as below, significant for fibrillation, bipolar 1 disorder, DM, HLD, HTN, IBS, CKD 3BF HFpEF who presents to the ED with complaint of chest pain, weakness, dyspnea. . The complaint involves an extensive differential diagnosis and also carries with it a high risk of complications and morbidity.  Serious etiology was considered. Ddx includes but is not limited to: In my evaluation of this patient's dyspnea my DDx includes, but is not limited to, pneumonia, pulmonary embolism, pneumothorax, pulmonary edema, metabolic acidosis, asthma, COPD, cardiac cause, anemia, anxiety, etc.  Differential includes all life-threatening causes for chest  pain. This includes but is not exclusive to acute coronary syndrome, aortic dissection, pulmonary embolism, cardiac tamponade, community-acquired  pneumonia, pericarditis, musculoskeletal chest wall pain, etc.   Complete initial physical exam performed, notably the patient  was no acute distress, breathing comfortably on ambient air.    Reviewed and confirmed nursing documentation for past medical history, family history, social history.  Vital signs reviewed.    Clinical Course as of 05/29/23 2339  Wed May 28, 2023  2251 Creatinine(!): 1.92 Improved from prior  [SG]  2357 Hepatic function panel(!) [SG]    Clinical Course User Index [SG] Sloan Leiter, DO      She had right heart cath 04/25/23; HFpEF, pulmonary hypertension.  Echocardiogram 8/624 with LVEF 60 - 6 5%  Workup is reassuring, labs stable.  She has questionable UTI, she is having dysuria and urgency, see urine culture, start antibiotics.  Allergies reviewed, will give Levaquin.  Feeling better at time of discharge.  Recommend close patient follow with PCP.  Her troponins are negative, she had right heart cath done around a month ago and echocardiogram which were both stable.  Symptoms do not appear to be consistent with ACS, symptoms appear consistent with atypical chest pain, recommend outpatient follow-up.  The patient improved significantly and was discharged in stable condition. Detailed discussions were had with the patient regarding current findings, and need for close f/u with PCP or on call doctor. The patient has been instructed to return immediately if the symptoms worsen in any way for re-evaluation. Patient verbalized understanding and is in agreement with current care plan. All questions answered prior to discharge.                   Additional history obtained: -Additional history obtained from na -External records from outside source obtained and reviewed including: Chart review including previous notes, labs, imaging, consultation notes including  Recent admission, prior heart cath, prior echo, home medications,, allergy list   Lab  Tests: -I ordered, reviewed, and interpreted labs.   The pertinent results include:   Labs Reviewed  BASIC METABOLIC PANEL - Abnormal; Notable for the following components:      Result Value   Sodium 132 (*)    CO2 20 (*)    Glucose, Bld 250 (*)    BUN 27 (*)    Creatinine, Ser 1.92 (*)    GFR, Estimated 29 (*)    All other components within normal limits  CBC - Abnormal; Notable for the following components:   RBC 3.73 (*)    Hemoglobin 11.0 (*)    HCT 32.2 (*)    RDW 16.9 (*)    All other components within normal limits  HEPATIC FUNCTION PANEL - Abnormal; Notable for the following components:   Total Protein 8.9 (*)    Albumin 3.4 (*)    AST 14 (*)    All other components within normal limits  URINALYSIS, ROUTINE W REFLEX MICROSCOPIC - Abnormal; Notable for the following components:   APPearance CLOUDY (*)    Protein, ur 30 (*)    Leukocytes,Ua SMALL (*)    Bacteria, UA RARE (*)    All other components within normal limits  RESP PANEL BY RT-PCR (RSV, FLU A&B, COVID)  RVPGX2  BRAIN NATRIURETIC PEPTIDE  TROPONIN I (HIGH SENSITIVITY)  TROPONIN I (HIGH SENSITIVITY)    Notable for labs stable  EKG   EKG Interpretation Date/Time:    Ventricular Rate:    PR Interval:  QRS Duration:    QT Interval:    QTC Calculation:   R Axis:      Text Interpretation:           Imaging Studies ordered: I ordered imaging studies including CXR CT renal I independently visualized the following imaging with scope of interpretation limited to determining acute life threatening conditions related to emergency care; findings noted above, significant for stable imaging  I independently visualized and interpreted imaging. I agree with the radiologist interpretation   Medicines ordered and prescription drug management: Meds ordered this encounter  Medications   morphine (PF) 4 MG/ML injection 4 mg   ondansetron (ZOFRAN) injection 4 mg   sodium chloride 0.9 % bolus 500 mL    levofloxacin (LEVAQUIN) tablet 250 mg   levofloxacin (LEVAQUIN) 250 MG tablet    Sig: Take 1 tablet (250 mg total) by mouth daily for 2 days.    Dispense:  2 tablet    Refill:  0    -I have reviewed the patients home medicines and have made adjustments as needed   Consultations Obtained: na   Cardiac Monitoring: The patient was maintained on a cardiac monitor.  I personally viewed and interpreted the cardiac monitored which showed an underlying rhythm of: NSR  Social Determinants of Health:  Diagnosis or treatment significantly limited by social determinants of health: former smoker and obesity   Reevaluation: After the interventions noted above, I reevaluated the patient and found that they have improved  Co morbidities that complicate the patient evaluation  Past Medical History:  Diagnosis Date   Asthma    Atrial fibrillation (HCC)    Bipolar 1 disorder (HCC)    Chronic back pain    Chronic chest pain    Diabetes mellitus without complication (HCC)    Gastroparesis    GERD (gastroesophageal reflux disease)    Hyperlipemia    Hypertension    IBS (irritable bowel syndrome)    Normal cardiac stress test 02/2014   [facility]      Dispostion: Disposition decision including need for hospitalization was considered, and patient discharged from emergency department.    Final Clinical Impression(s) / ED Diagnoses Final diagnoses:  Generalized weakness  Body aches  Cystitis  Atypical chest pain        Sloan Leiter, DO 05/29/23 2339

## 2023-05-28 NOTE — ED Triage Notes (Signed)
Pt states "I haven't felt good for several days but my chest started hurting today and if I walk any at all I feel SOB" Hospitalized 1 month go and symptoms are similar as they were then.

## 2023-05-28 NOTE — ED Notes (Signed)
RN ambulated pt while observing 02 levels. While she appeared winded when she returned to the bed she her O2 levels never dropped below 97%.  She also c/o legs feeling weak.

## 2023-05-28 NOTE — ED Notes (Signed)
Pt now to Xray.

## 2023-05-28 NOTE — ED Notes (Signed)
Lab in room getting blood, RN will return.

## 2023-05-29 MED ORDER — LEVOFLOXACIN 500 MG PO TABS
250.0000 mg | ORAL_TABLET | Freq: Once | ORAL | Status: AC
  Administered 2023-05-29: 250 mg via ORAL
  Filled 2023-05-29: qty 1

## 2023-05-29 MED ORDER — LEVOFLOXACIN 250 MG PO TABS: 250.0000 mg | ORAL_TABLET | Freq: Every day | ORAL | 0 refills | Status: AC

## 2023-05-29 NOTE — Discharge Instructions (Addendum)
It was a pleasure caring for you today in the emergency department. ° °Please return to the emergency department for any worsening or worrisome symptoms. ° ° °

## 2023-06-04 ENCOUNTER — Inpatient Hospital Stay (HOSPITAL_COMMUNITY)
Admission: EM | Admit: 2023-06-04 | Discharge: 2023-06-08 | DRG: 682 | Discharge status: Against Medical Advice | Payer: Medicaid - Out of State | Attending: Internal Medicine | Admitting: Internal Medicine

## 2023-06-04 ENCOUNTER — Encounter (HOSPITAL_COMMUNITY): Payer: Self-pay

## 2023-06-04 ENCOUNTER — Other Ambulatory Visit: Payer: Self-pay

## 2023-06-04 DIAGNOSIS — Z90721 Acquired absence of ovaries, unilateral: Secondary | ICD-10-CM

## 2023-06-04 DIAGNOSIS — E8721 Acute metabolic acidosis: Secondary | ICD-10-CM | POA: Diagnosis present

## 2023-06-04 DIAGNOSIS — Z881 Allergy status to other antibiotic agents status: Secondary | ICD-10-CM

## 2023-06-04 DIAGNOSIS — D61818 Other pancytopenia: Secondary | ICD-10-CM | POA: Insufficient documentation

## 2023-06-04 DIAGNOSIS — E66813 Obesity, class 3: Secondary | ICD-10-CM | POA: Diagnosis present

## 2023-06-04 DIAGNOSIS — E782 Mixed hyperlipidemia: Secondary | ICD-10-CM | POA: Diagnosis present

## 2023-06-04 DIAGNOSIS — R531 Weakness: Principal | ICD-10-CM

## 2023-06-04 DIAGNOSIS — Z6841 Body Mass Index (BMI) 40.0 and over, adult: Secondary | ICD-10-CM

## 2023-06-04 DIAGNOSIS — Z7902 Long term (current) use of antithrombotics/antiplatelets: Secondary | ICD-10-CM

## 2023-06-04 DIAGNOSIS — E8889 Other specified metabolic disorders: Secondary | ICD-10-CM | POA: Diagnosis present

## 2023-06-04 DIAGNOSIS — D631 Anemia in chronic kidney disease: Secondary | ICD-10-CM | POA: Diagnosis present

## 2023-06-04 DIAGNOSIS — Z91148 Patient's other noncompliance with medication regimen for other reason: Secondary | ICD-10-CM

## 2023-06-04 DIAGNOSIS — Z886 Allergy status to analgesic agent status: Secondary | ICD-10-CM

## 2023-06-04 DIAGNOSIS — Z91041 Radiographic dye allergy status: Secondary | ICD-10-CM

## 2023-06-04 DIAGNOSIS — J45909 Unspecified asthma, uncomplicated: Secondary | ICD-10-CM | POA: Diagnosis present

## 2023-06-04 DIAGNOSIS — Z9861 Coronary angioplasty status: Secondary | ICD-10-CM

## 2023-06-04 DIAGNOSIS — Z9049 Acquired absence of other specified parts of digestive tract: Secondary | ICD-10-CM

## 2023-06-04 DIAGNOSIS — Z888 Allergy status to other drugs, medicaments and biological substances status: Secondary | ICD-10-CM

## 2023-06-04 DIAGNOSIS — E871 Hypo-osmolality and hyponatremia: Secondary | ICD-10-CM | POA: Diagnosis present

## 2023-06-04 DIAGNOSIS — Z91013 Allergy to seafood: Secondary | ICD-10-CM

## 2023-06-04 DIAGNOSIS — I480 Paroxysmal atrial fibrillation: Secondary | ICD-10-CM | POA: Insufficient documentation

## 2023-06-04 DIAGNOSIS — U071 COVID-19: Secondary | ICD-10-CM

## 2023-06-04 DIAGNOSIS — E1122 Type 2 diabetes mellitus with diabetic chronic kidney disease: Secondary | ICD-10-CM | POA: Diagnosis present

## 2023-06-04 DIAGNOSIS — E1143 Type 2 diabetes mellitus with diabetic autonomic (poly)neuropathy: Secondary | ICD-10-CM | POA: Diagnosis present

## 2023-06-04 DIAGNOSIS — R197 Diarrhea, unspecified: Secondary | ICD-10-CM | POA: Insufficient documentation

## 2023-06-04 DIAGNOSIS — E876 Hypokalemia: Secondary | ICD-10-CM | POA: Insufficient documentation

## 2023-06-04 DIAGNOSIS — Z7901 Long term (current) use of anticoagulants: Secondary | ICD-10-CM

## 2023-06-04 DIAGNOSIS — D696 Thrombocytopenia, unspecified: Secondary | ICD-10-CM | POA: Insufficient documentation

## 2023-06-04 DIAGNOSIS — E46 Unspecified protein-calorie malnutrition: Secondary | ICD-10-CM | POA: Insufficient documentation

## 2023-06-04 DIAGNOSIS — Z91018 Allergy to other foods: Secondary | ICD-10-CM

## 2023-06-04 DIAGNOSIS — F319 Bipolar disorder, unspecified: Secondary | ICD-10-CM | POA: Diagnosis present

## 2023-06-04 DIAGNOSIS — N3000 Acute cystitis without hematuria: Secondary | ICD-10-CM | POA: Insufficient documentation

## 2023-06-04 DIAGNOSIS — K581 Irritable bowel syndrome with constipation: Secondary | ICD-10-CM | POA: Diagnosis present

## 2023-06-04 DIAGNOSIS — I10 Essential (primary) hypertension: Secondary | ICD-10-CM | POA: Diagnosis present

## 2023-06-04 DIAGNOSIS — Z79899 Other long term (current) drug therapy: Secondary | ICD-10-CM

## 2023-06-04 DIAGNOSIS — K3184 Gastroparesis: Secondary | ICD-10-CM | POA: Diagnosis present

## 2023-06-04 DIAGNOSIS — Z882 Allergy status to sulfonamides status: Secondary | ICD-10-CM

## 2023-06-04 DIAGNOSIS — N185 Chronic kidney disease, stage 5: Secondary | ICD-10-CM | POA: Diagnosis present

## 2023-06-04 DIAGNOSIS — I132 Hypertensive heart and chronic kidney disease with heart failure and with stage 5 chronic kidney disease, or end stage renal disease: Secondary | ICD-10-CM | POA: Diagnosis present

## 2023-06-04 DIAGNOSIS — I5033 Acute on chronic diastolic (congestive) heart failure: Secondary | ICD-10-CM | POA: Diagnosis present

## 2023-06-04 DIAGNOSIS — N170 Acute kidney failure with tubular necrosis: Principal | ICD-10-CM | POA: Diagnosis present

## 2023-06-04 DIAGNOSIS — E8809 Other disorders of plasma-protein metabolism, not elsewhere classified: Secondary | ICD-10-CM | POA: Diagnosis present

## 2023-06-04 DIAGNOSIS — M549 Dorsalgia, unspecified: Secondary | ICD-10-CM | POA: Diagnosis present

## 2023-06-04 DIAGNOSIS — N189 Chronic kidney disease, unspecified: Secondary | ICD-10-CM | POA: Diagnosis present

## 2023-06-04 DIAGNOSIS — I2510 Atherosclerotic heart disease of native coronary artery without angina pectoris: Secondary | ICD-10-CM

## 2023-06-04 DIAGNOSIS — Z5982 Transportation insecurity: Secondary | ICD-10-CM

## 2023-06-04 DIAGNOSIS — I5032 Chronic diastolic (congestive) heart failure: Secondary | ICD-10-CM | POA: Diagnosis present

## 2023-06-04 DIAGNOSIS — Z86718 Personal history of other venous thrombosis and embolism: Secondary | ICD-10-CM

## 2023-06-04 DIAGNOSIS — E1165 Type 2 diabetes mellitus with hyperglycemia: Secondary | ICD-10-CM | POA: Diagnosis present

## 2023-06-04 DIAGNOSIS — Z90710 Acquired absence of both cervix and uterus: Secondary | ICD-10-CM

## 2023-06-04 DIAGNOSIS — E860 Dehydration: Secondary | ICD-10-CM | POA: Diagnosis present

## 2023-06-04 DIAGNOSIS — K219 Gastro-esophageal reflux disease without esophagitis: Secondary | ICD-10-CM | POA: Diagnosis present

## 2023-06-04 DIAGNOSIS — I272 Pulmonary hypertension, unspecified: Secondary | ICD-10-CM | POA: Diagnosis present

## 2023-06-04 DIAGNOSIS — G8929 Other chronic pain: Secondary | ICD-10-CM | POA: Diagnosis present

## 2023-06-04 DIAGNOSIS — I252 Old myocardial infarction: Secondary | ICD-10-CM

## 2023-06-04 DIAGNOSIS — D509 Iron deficiency anemia, unspecified: Secondary | ICD-10-CM | POA: Diagnosis present

## 2023-06-04 DIAGNOSIS — D649 Anemia, unspecified: Secondary | ICD-10-CM | POA: Insufficient documentation

## 2023-06-04 DIAGNOSIS — I482 Chronic atrial fibrillation, unspecified: Secondary | ICD-10-CM | POA: Insufficient documentation

## 2023-06-04 DIAGNOSIS — N179 Acute kidney failure, unspecified: Secondary | ICD-10-CM | POA: Diagnosis present

## 2023-06-04 DIAGNOSIS — R112 Nausea with vomiting, unspecified: Secondary | ICD-10-CM | POA: Diagnosis present

## 2023-06-04 DIAGNOSIS — Z794 Long term (current) use of insulin: Secondary | ICD-10-CM

## 2023-06-04 LAB — URINALYSIS, ROUTINE W REFLEX MICROSCOPIC
Bilirubin Urine: NEGATIVE
Glucose, UA: 50 mg/dL — AB
Ketones, ur: NEGATIVE mg/dL
Leukocytes,Ua: NEGATIVE
Nitrite: NEGATIVE
Protein, ur: 100 mg/dL — AB
Specific Gravity, Urine: 1.014 (ref 1.005–1.030)
pH: 5 (ref 5.0–8.0)

## 2023-06-04 LAB — CBG MONITORING, ED: Glucose-Capillary: 126 mg/dL — ABNORMAL HIGH (ref 70–99)

## 2023-06-04 LAB — RESP PANEL BY RT-PCR (RSV, FLU A&B, COVID)  RVPGX2
Influenza A by PCR: NEGATIVE
Influenza B by PCR: NEGATIVE
Resp Syncytial Virus by PCR: NEGATIVE
SARS Coronavirus 2 by RT PCR: POSITIVE — AB

## 2023-06-04 NOTE — ED Triage Notes (Addendum)
Pt arrived via POV c/o weakness, N/V/D. Pt reports recent exposure to a Covid Positive family member. Pt reports decreased oral intake and could possibly be dehydrated. Pt reports she has been unable to take her insulin and recent prescribed medications due to her feeling sick. Pt recently Dx with Cystitis.

## 2023-06-05 ENCOUNTER — Emergency Department (HOSPITAL_COMMUNITY): Payer: Medicaid - Out of State

## 2023-06-05 DIAGNOSIS — D631 Anemia in chronic kidney disease: Secondary | ICD-10-CM | POA: Diagnosis present

## 2023-06-05 DIAGNOSIS — D696 Thrombocytopenia, unspecified: Secondary | ICD-10-CM

## 2023-06-05 DIAGNOSIS — Z794 Long term (current) use of insulin: Secondary | ICD-10-CM

## 2023-06-05 DIAGNOSIS — I272 Pulmonary hypertension, unspecified: Secondary | ICD-10-CM | POA: Diagnosis present

## 2023-06-05 DIAGNOSIS — R112 Nausea with vomiting, unspecified: Secondary | ICD-10-CM | POA: Diagnosis not present

## 2023-06-05 DIAGNOSIS — E860 Dehydration: Secondary | ICD-10-CM | POA: Diagnosis present

## 2023-06-05 DIAGNOSIS — E782 Mixed hyperlipidemia: Secondary | ICD-10-CM

## 2023-06-05 DIAGNOSIS — U071 COVID-19: Secondary | ICD-10-CM

## 2023-06-05 DIAGNOSIS — I5033 Acute on chronic diastolic (congestive) heart failure: Secondary | ICD-10-CM | POA: Diagnosis present

## 2023-06-05 DIAGNOSIS — E871 Hypo-osmolality and hyponatremia: Secondary | ICD-10-CM | POA: Diagnosis present

## 2023-06-05 DIAGNOSIS — E1122 Type 2 diabetes mellitus with diabetic chronic kidney disease: Secondary | ICD-10-CM | POA: Diagnosis present

## 2023-06-05 DIAGNOSIS — N170 Acute kidney failure with tubular necrosis: Secondary | ICD-10-CM | POA: Diagnosis present

## 2023-06-05 DIAGNOSIS — E876 Hypokalemia: Secondary | ICD-10-CM | POA: Diagnosis not present

## 2023-06-05 DIAGNOSIS — N179 Acute kidney failure, unspecified: Secondary | ICD-10-CM | POA: Diagnosis not present

## 2023-06-05 DIAGNOSIS — N189 Chronic kidney disease, unspecified: Secondary | ICD-10-CM

## 2023-06-05 DIAGNOSIS — N185 Chronic kidney disease, stage 5: Secondary | ICD-10-CM | POA: Insufficient documentation

## 2023-06-05 DIAGNOSIS — E8721 Acute metabolic acidosis: Secondary | ICD-10-CM | POA: Diagnosis present

## 2023-06-05 DIAGNOSIS — F319 Bipolar disorder, unspecified: Secondary | ICD-10-CM | POA: Diagnosis present

## 2023-06-05 DIAGNOSIS — E8809 Other disorders of plasma-protein metabolism, not elsewhere classified: Secondary | ICD-10-CM

## 2023-06-05 DIAGNOSIS — E1165 Type 2 diabetes mellitus with hyperglycemia: Secondary | ICD-10-CM | POA: Diagnosis present

## 2023-06-05 DIAGNOSIS — Z6841 Body Mass Index (BMI) 40.0 and over, adult: Secondary | ICD-10-CM | POA: Diagnosis not present

## 2023-06-05 DIAGNOSIS — I482 Chronic atrial fibrillation, unspecified: Secondary | ICD-10-CM

## 2023-06-05 DIAGNOSIS — I132 Hypertensive heart and chronic kidney disease with heart failure and with stage 5 chronic kidney disease, or end stage renal disease: Secondary | ICD-10-CM | POA: Diagnosis present

## 2023-06-05 DIAGNOSIS — E8889 Other specified metabolic disorders: Secondary | ICD-10-CM | POA: Diagnosis present

## 2023-06-05 DIAGNOSIS — R197 Diarrhea, unspecified: Secondary | ICD-10-CM

## 2023-06-05 DIAGNOSIS — I10 Essential (primary) hypertension: Secondary | ICD-10-CM

## 2023-06-05 DIAGNOSIS — R531 Weakness: Secondary | ICD-10-CM | POA: Diagnosis present

## 2023-06-05 DIAGNOSIS — J45909 Unspecified asthma, uncomplicated: Secondary | ICD-10-CM | POA: Diagnosis present

## 2023-06-05 DIAGNOSIS — N3000 Acute cystitis without hematuria: Secondary | ICD-10-CM | POA: Diagnosis present

## 2023-06-05 DIAGNOSIS — D61818 Other pancytopenia: Secondary | ICD-10-CM | POA: Diagnosis present

## 2023-06-05 DIAGNOSIS — E46 Unspecified protein-calorie malnutrition: Secondary | ICD-10-CM

## 2023-06-05 DIAGNOSIS — I480 Paroxysmal atrial fibrillation: Secondary | ICD-10-CM | POA: Diagnosis present

## 2023-06-05 DIAGNOSIS — E1143 Type 2 diabetes mellitus with diabetic autonomic (poly)neuropathy: Secondary | ICD-10-CM | POA: Diagnosis present

## 2023-06-05 LAB — COMPREHENSIVE METABOLIC PANEL
ALT: 23 U/L (ref 0–44)
AST: 14 U/L — ABNORMAL LOW (ref 15–41)
Albumin: 3.4 g/dL — ABNORMAL LOW (ref 3.5–5.0)
Alkaline Phosphatase: 50 U/L (ref 38–126)
Anion gap: 14 (ref 5–15)
BUN: 49 mg/dL — ABNORMAL HIGH (ref 6–20)
CO2: 19 mmol/L — ABNORMAL LOW (ref 22–32)
Calcium: 7.9 mg/dL — ABNORMAL LOW (ref 8.9–10.3)
Chloride: 100 mmol/L (ref 98–111)
Creatinine, Ser: 5.96 mg/dL — ABNORMAL HIGH (ref 0.44–1.00)
GFR, Estimated: 8 mL/min — ABNORMAL LOW (ref >60)
Glucose, Bld: 139 mg/dL — ABNORMAL HIGH (ref 70–99)
Potassium: 3.2 mmol/L — ABNORMAL LOW (ref 3.5–5.1)
Sodium: 133 mmol/L — ABNORMAL LOW (ref 135–145)
Total Bilirubin: 0.5 mg/dL (ref 0.3–1.2)
Total Protein: 8.7 g/dL — ABNORMAL HIGH (ref 6.5–8.1)

## 2023-06-05 LAB — MAGNESIUM
Magnesium: 1.8 mg/dL (ref 1.7–2.4)
Magnesium: 1.7 mg/dL (ref 1.7–2.4)

## 2023-06-05 LAB — BASIC METABOLIC PANEL
Anion gap: 12 (ref 5–15)
BUN: 48 mg/dL — ABNORMAL HIGH (ref 6–20)
CO2: 19 mmol/L — ABNORMAL LOW (ref 22–32)
Calcium: 7.3 mg/dL — ABNORMAL LOW (ref 8.9–10.3)
Chloride: 103 mmol/L (ref 98–111)
Creatinine, Ser: 6.54 mg/dL — ABNORMAL HIGH (ref 0.44–1.00)
GFR, Estimated: 7 mL/min — ABNORMAL LOW (ref >60)
Glucose, Bld: 105 mg/dL — ABNORMAL HIGH (ref 70–99)
Potassium: 3.6 mmol/L (ref 3.5–5.1)
Sodium: 134 mmol/L — ABNORMAL LOW (ref 135–145)

## 2023-06-05 LAB — CBC WITH DIFFERENTIAL/PLATELET
Abs Immature Granulocytes: 0.02 10*3/uL (ref 0.00–0.07)
Basophils Absolute: 0 10*3/uL (ref 0.0–0.1)
Basophils Relative: 0 %
Eosinophils Absolute: 0.1 10*3/uL (ref 0.0–0.5)
Eosinophils Relative: 1 %
HCT: 28.2 % — ABNORMAL LOW (ref 36.0–46.0)
Hemoglobin: 9.6 g/dL — ABNORMAL LOW (ref 12.0–15.0)
Immature Granulocytes: 0 %
Lymphocytes Relative: 26 %
Lymphs Abs: 1.5 10*3/uL (ref 0.7–4.0)
MCH: 29.8 pg (ref 26.0–34.0)
MCHC: 34 g/dL (ref 30.0–36.0)
MCV: 87.6 fL (ref 80.0–100.0)
Monocytes Absolute: 0.4 10*3/uL (ref 0.1–1.0)
Monocytes Relative: 7 %
Neutro Abs: 3.8 10*3/uL (ref 1.7–7.7)
Neutrophils Relative %: 66 %
Platelets: 147 10*3/uL — ABNORMAL LOW (ref 150–400)
RBC: 3.22 MIL/uL — ABNORMAL LOW (ref 3.87–5.11)
RDW: 17.2 % — ABNORMAL HIGH (ref 11.5–15.5)
WBC: 5.8 10*3/uL (ref 4.0–10.5)
nRBC: 0 % (ref 0.0–0.2)

## 2023-06-05 LAB — PHOSPHORUS: Phosphorus: 7.7 mg/dL — ABNORMAL HIGH (ref 2.5–4.6)

## 2023-06-05 LAB — GLUCOSE, CAPILLARY
Glucose-Capillary: 104 mg/dL — ABNORMAL HIGH (ref 70–99)
Glucose-Capillary: 97 mg/dL (ref 70–99)
Glucose-Capillary: 128 mg/dL — ABNORMAL HIGH (ref 70–99)

## 2023-06-05 MED ORDER — ONDANSETRON HCL 4 MG/2ML IJ SOLN
4.0000 mg | Freq: Once | INTRAMUSCULAR | Status: AC
  Administered 2023-06-05: 4 mg via INTRAVENOUS
  Filled 2023-06-05: qty 2

## 2023-06-05 MED ORDER — ORAL CARE MOUTH RINSE: 15.0000 mL | OROMUCOSAL | Status: DC | PRN

## 2023-06-05 MED ORDER — GLUCERNA SHAKE PO LIQD
237.0000 mL | Freq: Three times a day (TID) | ORAL | Status: DC
  Administered 2023-06-05 – 2023-06-08 (×9): 237 mL via ORAL

## 2023-06-05 MED ORDER — OXYCODONE HCL 5 MG PO TABS
5.0000 mg | ORAL_TABLET | ORAL | Status: DC | PRN
  Administered 2023-06-05 (×2): 5 mg via ORAL
  Filled 2023-06-05 (×2): qty 1

## 2023-06-05 MED ORDER — ACETAMINOPHEN 325 MG PO TABS
650.0000 mg | ORAL_TABLET | Freq: Four times a day (QID) | ORAL | Status: DC | PRN
  Administered 2023-06-05: 650 mg via ORAL
  Filled 2023-06-05: qty 2

## 2023-06-05 MED ORDER — INSULIN ASPART 100 UNIT/ML IJ SOLN: 0.0000 [IU] | Freq: Every day | INTRAMUSCULAR | Status: DC

## 2023-06-05 MED ORDER — TRAZODONE HCL 50 MG PO TABS
50.0000 mg | ORAL_TABLET | Freq: Every evening | ORAL | Status: DC | PRN
  Filled 2023-06-05: qty 1

## 2023-06-05 MED ORDER — SENNOSIDES-DOCUSATE SODIUM 8.6-50 MG PO TABS
1.0000 | ORAL_TABLET | Freq: Every evening | ORAL | Status: DC | PRN
  Filled 2023-06-05: qty 1

## 2023-06-05 MED ORDER — POTASSIUM CHLORIDE 10 MEQ/100ML IV SOLN
10.0000 meq | INTRAVENOUS | Status: AC
  Administered 2023-06-05 (×3): 10 meq via INTRAVENOUS
  Filled 2023-06-05 (×3): qty 100

## 2023-06-05 MED ORDER — CARVEDILOL 12.5 MG PO TABS
12.5000 mg | ORAL_TABLET | Freq: Two times a day (BID) | ORAL | Status: DC
  Administered 2023-06-05 – 2023-06-08 (×5): 12.5 mg via ORAL
  Filled 2023-06-05 (×6): qty 1

## 2023-06-05 MED ORDER — ATORVASTATIN CALCIUM 40 MG PO TABS
40.0000 mg | ORAL_TABLET | Freq: Every morning | ORAL | Status: DC
  Administered 2023-06-06 – 2023-06-07 (×2): 40 mg via ORAL
  Filled 2023-06-05 (×3): qty 1

## 2023-06-05 MED ORDER — FENTANYL CITRATE PF 50 MCG/ML IJ SOSY
50.0000 ug | PREFILLED_SYRINGE | Freq: Once | INTRAMUSCULAR | Status: AC
  Administered 2023-06-05: 50 ug via INTRAVENOUS
  Filled 2023-06-05: qty 1

## 2023-06-05 MED ORDER — HYDRALAZINE HCL 20 MG/ML IJ SOLN: 10.0000 mg | INTRAMUSCULAR | Status: DC | PRN

## 2023-06-05 MED ORDER — SODIUM CHLORIDE 0.9 % IV SOLN: INTRAVENOUS | Status: AC

## 2023-06-05 MED ORDER — ONDANSETRON HCL 4 MG/2ML IJ SOLN
4.0000 mg | Freq: Four times a day (QID) | INTRAMUSCULAR | Status: DC | PRN
  Administered 2023-06-05 – 2023-06-07 (×5): 4 mg via INTRAVENOUS
  Filled 2023-06-05 (×5): qty 2

## 2023-06-05 MED ORDER — INSULIN GLARGINE-YFGN 100 UNIT/ML ~~LOC~~ SOLN
10.0000 [IU] | Freq: Every day | SUBCUTANEOUS | Status: DC
  Administered 2023-06-05 – 2023-06-07 (×3): 10 [IU] via SUBCUTANEOUS
  Filled 2023-06-05 (×4): qty 0.1

## 2023-06-05 MED ORDER — APIXABAN 5 MG PO TABS: 5.0000 mg | ORAL_TABLET | Freq: Two times a day (BID) | ORAL | Status: DC

## 2023-06-05 MED ORDER — GUAIFENESIN 100 MG/5ML PO LIQD: 5.0000 mL | ORAL | Status: DC | PRN

## 2023-06-05 MED ORDER — METOPROLOL TARTRATE 5 MG/5ML IV SOLN: 5.0000 mg | INTRAVENOUS | Status: DC | PRN

## 2023-06-05 MED ORDER — SODIUM CHLORIDE 0.9 % IV SOLN
1.0000 g | Freq: Once | INTRAVENOUS | Status: AC
  Administered 2023-06-05: 1 g via INTRAVENOUS
  Filled 2023-06-05: qty 10

## 2023-06-05 MED ORDER — SODIUM CHLORIDE 0.9 % IV BOLUS
1000.0000 mL | Freq: Once | INTRAVENOUS | Status: AC
  Administered 2023-06-05: 1000 mL via INTRAVENOUS

## 2023-06-05 MED ORDER — INSULIN ASPART 100 UNIT/ML IJ SOLN
0.0000 [IU] | Freq: Three times a day (TID) | INTRAMUSCULAR | Status: DC
  Administered 2023-06-06 – 2023-06-07 (×4): 3 [IU] via SUBCUTANEOUS
  Administered 2023-06-07 – 2023-06-08 (×3): 2 [IU] via SUBCUTANEOUS

## 2023-06-05 MED ORDER — APIXABAN 5 MG PO TABS
5.0000 mg | ORAL_TABLET | Freq: Two times a day (BID) | ORAL | Status: DC
  Administered 2023-06-05 – 2023-06-07 (×5): 5 mg via ORAL
  Filled 2023-06-05 (×6): qty 1

## 2023-06-05 MED ORDER — IPRATROPIUM-ALBUTEROL 0.5-2.5 (3) MG/3ML IN SOLN: 3.0000 mL | RESPIRATORY_TRACT | Status: DC | PRN

## 2023-06-05 NOTE — Plan of Care (Signed)

## 2023-06-05 NOTE — Evaluation (Signed)
Physical Therapy Evaluation Patient Details Name: [name] MRN: [id number] DOB: 05-31-1961 Today's Date: 06/05/2023  History of Present Illness  [facility] ER:In the emergency department, BP was 127/93 on arrival to the ED, other vital signs were within normal range.  Workup in the ED showed normocytic anemia and thrombocytopenia.  BMP showed sodium 133, potassium 3.2, chloride 100, bicarb 19, blood glucose 139, BUN 49, creatinine 5.96 (baseline creatinine 1.9-2.3).  Albumin 3.4.  Influenza A, B, RSV was negative, SARS coronavirus 2 was positive.  CT abdomen and pelvis without contrast showed no acute abnormality  Chest x-ray showed no active disease.  Patient was treated with IV ceftriaxone, IV fentanyl 50 mcg x 1 was given.  She was started on IV hydralazine and Lopressor due to elevated BP.  Hospitalist was asked to admit patient for further evaluation and management.  Clinical Impression  PT I in bed mobility, fatigues after 30 ft of ambulation.         If plan is discharge home, recommend the following: Assistance with cooking/housework;Assist for transportation   Can travel by private vehicle    yes    Equipment Recommendations None recommended by PT  Recommendations for Other Services    no      Precautions / Restrictions Precautions Precautions: None Restrictions Weight Bearing Restrictions: No      Mobility  Bed Mobility Overal bed mobility: Independent                  Transfers Overall transfer level: Independent                      Ambulation/Gait Ambulation/Gait assistance: Modified independent (Device/Increase time) Gait Distance (Feet): 30 Feet Assistive device: None Gait Pattern/deviations: Decreased step length - right, Decreased step length - left   Gait velocity interpretation: <1.31 ft/sec, indicative of household ambulator   General Gait Details: waddles -pt states that is how she normally walks           Pertinent  Vitals/Pain Pain Assessment Pain Assessment: 0-10 Pain Score: 4  Pain Location: abdominal Pain Descriptors / Indicators: Aching Pain Intervention(s): Limited activity within patient's tolerance    Home Living Family/patient expects to be discharged to:: Private residence Living Arrangements: Children Available Help at Discharge: Family;Available 24 hours/day Type of Home: House Home Access: Stairs to enter Entrance Stairs-Rails: None Entrance Stairs-Number of Steps: 2   Home Layout: One level Home Equipment: Grab bars - tub/shower Additional Comments: Pt states she lives with daughter who assists when needed    Prior Function Prior Level of Function : Independent/Modified Independent             Mobility Comments: states that she walks without a walker but she has one ADLs Comments: able to perform ADLs     Extremity/Trunk Assessment        Lower Extremity Assessment Lower Extremity Assessment: Overall WFL for tasks assessed       Communication   Communication Communication: No apparent difficulties Cueing Techniques: Verbal cues  Cognition Arousal: Alert Behavior During Therapy: WFL for tasks assessed/performed                                                   Assessment/Plan    PT Assessment Patient needs continued PT services  PT Problem List Decreased strength  PT Treatment Interventions Gait training;Therapeutic exercise    PT Goals (Current goals can be found in the Care Plan section)       Frequency Min 2X/week        AM-PAC PT "6 Clicks" Mobility  Outcome Measure Help needed turning from your back to your side while in a flat bed without using bedrails?: None Help needed moving from lying on your back to sitting on the side of a flat bed without using bedrails?: None Help needed moving to and from a bed to a chair (including a wheelchair)?: None Help needed standing up from a chair using your arms (e.g.,  wheelchair or bedside chair)?: None Help needed to walk in hospital room?: None Help needed climbing 3-5 steps with a railing? : A Little 6 Click Score: 23    End of Session   Activity Tolerance: Patient tolerated treatment well Patient left: in bed;with call bell/phone within reach   PT Visit Diagnosis: Unsteadiness on feet (R26.81)    Time: 2536-6440 PT Time Calculation (min) (ACUTE ONLY): 21 min   Charges:   PT Evaluation $PT Eval Low Complexity: 1 Low            Virgina Organ, PT CLT [phone]  06/05/2023, 3:35 PM

## 2023-06-05 NOTE — TOC Initial Note (Signed)
Transition of Care [facility]) - Initial/Assessment Note    Patient Details  Name: [name] MRN: [id number] Date of Birth: Jun 02, 1961  [facility] CM/SW Contact:    Villa Herb, LCSWA Phone Number: 06/05/2023, 1:01 PM  Clinical Narrative:                 Pt is high risk for readmission. CSW spoke with pts daughter who states that pt lives in a home with her ex husband and her daughter. Pts daughter provides transportation and provides assistance with ADLs when needed. Pt has a walker to use when needed. Pts daughter inquired about pts out of states Medicaid being from [place] and needing it changed to [facility]. CSW explained they will have to work with the social services office in the county pt lives in to start this transfer. TOC to follow.   Expected Discharge Plan: Home/Self Care Barriers to Discharge: Continued Medical Work up   Patient Goals and CMS Choice Patient states their goals for this hospitalization and ongoing recovery are:: return homw CMS Medicare.gov Compare Post Acute Care list provided to:: Patient Choice offered to / list presented to : Patient      Expected Discharge Plan and Services In-house Referral: Clinical Social Work Discharge Planning Services: CM Consult   Living arrangements for the past 2 months: Single Family Home                                      Prior Living Arrangements/Services Living arrangements for the past 2 months: Single Family Home Lives with:: Adult Children, Self Patient language and need for interpreter reviewed:: Yes Do you feel safe going back to the place where you live?: Yes      Need for Family Participation in Patient Care: Yes (Comment) Care giver support system in place?: Yes (comment) Current home services: DME Criminal Activity/Legal Involvement Pertinent to Current Situation/Hospitalization: No - Comment as needed  Activities of Daily Living Home Assistive Devices/Equipment: Eyeglasses,  Wheelchair ADL Screening (condition at time of admission) Patient's cognitive ability adequate to safely complete daily activities?: Yes Is the patient deaf or have difficulty hearing?: No Does the patient have difficulty seeing, even when wearing glasses/contacts?: No Does the patient have difficulty concentrating, remembering, or making decisions?: No Patient able to express need for assistance with ADLs?: Yes Does the patient have difficulty dressing or bathing?: No Independently performs ADLs?: Yes (appropriate for developmental age) Does the patient have difficulty walking or climbing stairs?: No Weakness of Legs: Both Weakness of Arms/Hands: None  Permission Sought/Granted                  Emotional Assessment Appearance:: Appears stated age Attitude/Demeanor/Rapport: Engaged Affect (typically observed): Accepting   Alcohol / Substance Use: Not Applicable Psych Involvement: No (comment)  Admission diagnosis:  Dehydration [E86.0] Weakness [R53.1] AKI (acute kidney injury) (HCC) [N17.9] Acute kidney injury (HCC) [N17.9] COVID-19 [U07.1] Patient Active Problem List   Diagnosis Date Noted   Acute kidney injury (HCC) 06/05/2023   Hypokalemia 06/05/2023   COVID-19 virus infection 06/05/2023   Acute diarrhea 06/05/2023   Thrombocytopenia (HCC) 06/05/2023   Hypoalbuminemia due to protein-calorie malnutrition (HCC) 06/05/2023   Atrial fibrillation, chronic (HCC) 06/05/2023   Orthostatic hypotension 05/02/2023   Hyponatremia 05/02/2023   Chronic diastolic heart failure (HCC) 04/23/2023   Unstable angina (HCC) 04/22/2023   Stage 3b chronic kidney disease (  HCC) 04/22/2023   Type 2 diabetes mellitus with complication, with long-term current use of insulin (HCC) 04/22/2023   Acute on chronic heart failure with preserved ejection fraction (HCC) 04/22/2023   Chronic anemia 04/22/2023   RLQ abdominal pain 04/18/2023   Chest pain with high risk for cardiac etiology 04/17/2023    Atypical chest pain 04/16/2023   Nausea & vomiting 04/16/2023   Acute kidney injury superimposed on chronic kidney disease (HCC) 04/16/2023   Type 2 diabetes mellitus with hyperglycemia (HCC) 04/16/2023   Persistent atrial fibrillation (HCC) 04/16/2023   Obesity, Class III, BMI 40-49.9 (morbid obesity) (HCC) 04/16/2023   Erroneous encounter - disregard 02/14/2023   Left-sided weakness 02/08/2023   BRBPR (bright red blood per rectum) 04/07/2012   GASTRIC POLYP 10/21/2006   Mixed hyperlipidemia 10/21/2006   DISORDER, BIPOLAR NOS 10/21/2006   DEPRESSION 10/21/2006   MIGRAINE HEADACHE 10/21/2006   Essential hypertension 10/21/2006   CAD S/P percutaneous coronary angioplasty 10/21/2006   GERD 10/21/2006   LOW BACK PAIN 10/21/2006   PCP:  Patient, No Pcp Per Pharmacy:   [id number] [place], [place] - [address] AT [place] [facility] [place] [address] [place] [address] Phone: [phone] Fax: [phone]     Social Determinants of Health (SDOH) Social History: SDOH Screenings   Food Insecurity: No Food Insecurity (06/05/2023)  Housing: Low Risk  (06/05/2023)  Transportation Needs: Unmet Transportation Needs (06/05/2023)  Utilities: Not At Risk (06/05/2023)  Tobacco Use: Medium Risk (06/04/2023)   SDOH Interventions:     Readmission Risk Interventions    06/05/2023   12:59 PM  Readmission Risk Prevention Plan  Transportation Screening Complete  Medication Review (RN Care Manager) Complete  HRI or Home Care Consult Complete  SW Recovery Care/Counseling Consult Complete  Palliative Care Screening Not Applicable  Skilled Nursing Facility Not Applicable

## 2023-06-05 NOTE — H&P (Signed)
History and Physical    [name]: [name] ZOX:[id number] DOB: 09/16/1961 DOA: 06/04/2023 DOS: the [name] was seen and examined on 06/05/2023 PCP: [name], No Pcp Per  [name] coming from: Home  Chief Complaint:  Chief Complaint  [name] presents with   Weakness   HPI: [name] is a 62 y.o. female with medical history significant of hypertension, hyperlipidemia, CAD s/p PCI T2DM, DVT on Eliquis who presents to the emergency department with complaints of generalized weakness days of onset of nausea, vomiting and diarrhea.  [name] states that she was recently exposed to a family member who had COVID.  She endorsed coughing (nonproductive), decreased oral intake.  Vomiting was several episodes daily and was nonbloody.  Diarrhea was watery in nature and was also nonbloody.  She felt dehydrated and she states that she was recently treated for UTI.  [name] complained of dizziness and generalized weakness.  She has not been compliant with with insulin and other meds due to feeling sick.  ED Course:  In the emergency department, BP was 127/93 on arrival to the ED, other vital signs were within normal range.  Workup in the ED showed normocytic anemia and thrombocytopenia.  BMP showed sodium 133, potassium 3.2, chloride 100, bicarb 19, blood glucose 139, BUN 49, creatinine 5.96 (baseline creatinine 1.9-2.3).  Albumin 3.4.  Influenza A, B, RSV was negative, SARS coronavirus 2 was positive. CT abdomen and pelvis without contrast showed no acute abnormality Chest x-ray showed no active disease. [name] was treated with IV ceftriaxone, IV fentanyl 50 mcg x 1 was given.  She was started on IV hydralazine and Lopressor due to elevated BP. Hospitalist was asked to admit [name] for further evaluation and management.  Review of Systems: Review of systems as noted in the HPI. All other systems reviewed and are negative.   Past Medical History:  Diagnosis Date   Asthma    Atrial fibrillation  (HCC)    Bipolar 1 disorder (HCC)    Chronic back pain    Chronic chest pain    Diabetes mellitus without complication (HCC)    Gastroparesis    GERD (gastroesophageal reflux disease)    Hyperlipemia    Hypertension    IBS (irritable bowel syndrome)    Normal cardiac stress test 02/2014   [facility]   Past Surgical History:  Procedure Laterality Date   ABDOMINAL HYSTERECTOMY  2001   APPENDECTOMY  10/2005   CHOLECYSTECTOMY  10/2005   COLONOSCOPY  2007   [facility]el ([name])-pt reports hemorrhoids   ESOPHAGOGASTRODUODENOSCOPY  02/28/2006   [name], normal on daily PPI, myultiple hyperplastic polyps   MOUTH SURGERY     RIGHT HEART CATH N/A 04/25/2023   Procedure: RIGHT HEART CATH;  Surgeon: Kathleene Hazel, MD;  Location: [facility];  Service: Cardiovascular;  Laterality: N/A;   RIGHT OOPHORECTOMY  1999    Social History:  reports that she is a non-smoker but has been exposed to tobacco smoke. She has never used smokeless tobacco. She reports that she does not drink alcohol and does not use drugs.   Allergies  Allergen Reactions   Gelatin Swelling and Other (See Comments)    No jello of any kind  No jello of any kind  No jello of any kind   Iodinated Contrast Media Itching, Swelling, Anaphylaxis, Hives and Other (See Comments)    Other reaction(s): Edema   Methylprednisolone Sodium Succ Shortness Of Breath    Other reaction(s): Shortness Of Breath, Unknown  Dicyclomine Hcl Anxiety and Other (See Comments)    "shaky and sick"   Doxycycline Hives and Rash    rash   Glutamic Acid Itching and Swelling   Ketorolac Hives and Rash    rash  Tolerated Toradol in the ED without side effect, hives, or complaint on 02/17/2022   Lisinopril Swelling and Other (See Comments)    Other reaction(s): Angioedema (facial, lip, or tongue swelling)   Metformin Nausea And Vomiting and Other (See Comments)    sick to stomach  "It made me sick where I couldn't get out of  the bed"  Other reaction(s): Dyspepsia  sick to stomach   Nitroglycerin Nausea And Vomiting and Swelling    Pt states it makes her too sick   Metformin And Related Nausea Only   Nsaids Other (See Comments)    Stomach pain/bleeding  bleeding in stomach  bleeding in stomach  Other reaction(s): Other (See Comments)  Stomach pain/bleeding   Other Itching, Swelling and Other (See Comments)    Solu-Medrol Mix-O-Vial   Quetiapine Other (See Comments)   Vancomycin     Itching and erythema at infusion site within a few minutes of starting infusion. No systemic evidence of Red Man Syndrome   Blueberry Flavor Nausea And Vomiting and Rash   Cephalexin Nausea And Vomiting, Nausea Only and Other (See Comments)    sick to stomach   Dicyclomine Nausea Only and Other (See Comments)    sick to stomach  Other reaction(s): Anxiety, Unknown  sick to stomach   Fish Allergy Rash   Ibuprofen Other (See Comments) and Nausea And Vomiting    GI upset  Other reaction(s): Gastrointestinal, Other (See Comments)  GI upset  Reports stomach upset 2/2 hx gastric polyps. Not true allergy.   Ketorolac Tromethamine Hives, Other (See Comments) and Rash   Nitrofuran Derivatives Itching and Nausea And Vomiting    sick to stomach   Nitrofurantoin Nausea And Vomiting   Nitrofurantoin Monohyd Macro Itching   Shellfish Allergy Itching, Swelling and Rash   Strawberry Extract Rash   Strawberry Flavor Nausea And Vomiting and Rash   Sulfa Antibiotics Nausea And Vomiting and Other (See Comments)    "It just makes me real sick"    Family History  Problem Relation Age of Onset   Cirrhosis Mother 70       ?etiology   Cirrhosis Father        ?etiology   Cirrhosis Sister 30       ?etiology   Diverticulitis Sister     Prior to Admission medications   Medication Sig Start Date End Date Taking? Authorizing Provider  acetaminophen (TYLENOL) 325 MG tablet Take 325 mg by mouth every 4 (four) hours as needed for mild  pain, headache or fever. 05/01/21   [provider]  albuterol (PROAIR HFA) 108 (90 BASE) MCG/ACT inhaler Inhale 1-2 puffs into the lungs every 6 (six) hours as needed for wheezing or shortness of breath.    [provider]  apixaban (ELIQUIS) 5 MG TABS tablet Take 1 tablet (5 mg total) by mouth 2 (two) times daily. 11/17/17   Bethann Berkshire, MD  atorvastatin (LIPITOR) 40 MG tablet Take 40 mg by mouth every morning.    [provider]  carvedilol (COREG) 25 MG tablet Take 0.5 tablets (12.5 mg total) by mouth 2 (two) times daily with a meal. [name] taking differently: Take 12.5 mg by mouth daily with breakfast. 05/03/23   Charolotte Eke, MD  clopidogrel (  PLAVIX) 75 MG tablet Take 75 mg by mouth daily.    [provider]  diphenhydrAMINE (BENADRYL) 25 MG tablet Take 25 mg by mouth at bedtime as needed for sleep. 04/25/21   [provider]  gabapentin (NEURONTIN) 300 MG capsule Take 300 mg by mouth 3 (three) times daily.    [provider]  insulin detemir (LEVEMIR) 100 UNIT/ML injection Inject 50 Units into the skin 2 (two) times daily.    [provider]  isosorbide mononitrate (IMDUR) 60 MG 24 hr tablet Take 1 tablet (60 mg total) by mouth every morning. 05/04/23   Charolotte Eke, MD  losartan (COZAAR) 25 MG tablet Take 25 mg by mouth daily. 12/19/22   [provider]  NOVOLOG FLEXPEN 100 UNIT/ML FlexPen Inject 20 Units into the skin 4 (four) times daily. 4-5 times daily 05/13/15   [provider]  sertraline (ZOLOFT) 100 MG tablet Take 100 mg by mouth daily.    [provider]  trazodone (DESYREL) 300 MG tablet Take 300 mg by mouth at bedtime. 03/03/23   [provider]    Physical Exam: BP (!) 113/53 (BP Location: Right Arm)   Pulse 68   Temp 98 F (36.7 C) (Oral)   Resp 20   Ht 5\' 3"  (1.6 m)   Wt 105 kg   SpO2 100%   BMI 41.01 kg/m   General: 62 y.o. year-old female well developed well nourished  in no acute distress.  Alert and oriented x3. HEENT: NCAT, EOMI Neck: Supple, trachea medial Cardiovascular: Regular rate and rhythm with no rubs or gallops.  No thyromegaly or JVD noted.  No lower extremity edema. 2/4 pulses in all 4 extremities. Respiratory: Clear to auscultation with no wheezes or rales. Good inspiratory effort. Abdomen: Soft, nontender nondistended with normal bowel sounds x4 quadrants. Muskuloskeletal: No cyanosis, clubbing or edema noted bilaterally Neuro: CN II-XII intact, strength 5/5 x 4, sensation, reflexes intact Skin: No ulcerative lesions noted or rashes Psychiatry: Mood is appropriate for condition and setting          Labs on Admission:  Basic Metabolic Panel: Recent Labs  Lab 06/04/23 2354  NA 133*  K 3.2*  CL 100  CO2 19*  GLUCOSE 139*  BUN 49*  CREATININE 5.96*  CALCIUM 7.9*   Liver Function Tests: Recent Labs  Lab 06/04/23 2354  AST 14*  ALT 23  ALKPHOS 50  BILITOT 0.5  PROT 8.7*  ALBUMIN 3.4*   No results for input(s): "LIPASE", "AMYLASE" in the last 168 hours. No results for input(s): "AMMONIA" in the last 168 hours. CBC: Recent Labs  Lab 06/04/23 2354  WBC 5.8  NEUTROABS 3.8  HGB 9.6*  HCT 28.2*  MCV 87.6  PLT 147*   Cardiac Enzymes: No results for input(s): "CKTOTAL", "CKMB", "CKMBINDEX", "TROPONINI" in the last 168 hours.  BNP (last 3 results) Recent Labs    04/21/23 1256 04/22/23 0432 05/28/23 2046  BNP 183.0* 297.0* 38.0    ProBNP (last 3 results) No results for input(s): "PROBNP" in the last 8760 hours.  CBG: Recent Labs  Lab 06/04/23 2241  GLUCAP 126*    Radiological Exams on Admission: CT Renal Stone Study  Result Date: 06/05/2023 CLINICAL DATA:  Weakness with nausea and vomiting, initial encounter EXAM: CT ABDOMEN AND PELVIS WITHOUT CONTRAST TECHNIQUE: Multidetector CT imaging of the abdomen and pelvis was performed following the standard protocol without IV contrast. RADIATION DOSE REDUCTION:  This exam was performed according to the departmental  dose-optimization program which includes automated exposure control, adjustment of the mA and/or kV according to [name] size and/or use of iterative reconstruction technique. COMPARISON:  05/28/2023 FINDINGS: Lower chest: No acute abnormality. Hepatobiliary: No focal liver abnormality is seen. Status post cholecystectomy. No biliary dilatation. Pancreas: Unremarkable. No pancreatic ductal dilatation or surrounding inflammatory changes. Spleen: Normal in size without focal abnormality. Adrenals/Urinary Tract: Adrenal glands are within normal limits. Kidneys demonstrate small nonobstructing renal calculi bilaterally. Ureters are within normal limits. The bladder is decompressed. Stomach/Bowel: No obstructive or inflammatory changes of the colon are seen. The appendix has been surgically removed. Small bowel and stomach are within normal limits. Vascular/Lymphatic: Aortic atherosclerosis. No enlarged abdominal or pelvic lymph nodes. Reproductive: Status post hysterectomy. No adnexal masses. Other: No abdominal wall hernia or abnormality. No abdominopelvic ascites. Musculoskeletal: No acute or significant osseous findings. Stable skin induration is noted in skin overlying the anterior left pelvis. IMPRESSION: No acute abnormality is noted. Bilateral nonobstructing renal calculi. No change from the prior exam. Electronically Signed   By: Alcide Clever M.D.   On: 06/05/2023 01:43   DG Chest Portable 1 View  Result Date: 06/05/2023 CLINICAL DATA:  Cough and weakness EXAM: PORTABLE CHEST 1 VIEW COMPARISON:  05/28/2023 FINDINGS: Cardiac shadow is within normal limits. Lungs are well aerated bilaterally. No focal infiltrate or effusion is seen. No bony abnormality is noted. IMPRESSION: No active disease. Electronically Signed   By: Alcide Clever M.D.   On: 06/05/2023 01:39    EKG: I independently viewed the EKG done and my findings are as followed: Normal sinus rhythm  at a rate of 65 bpm  Assessment/Plan Present on Admission:  Acute kidney injury superimposed on chronic kidney disease (HCC)  Nausea & vomiting  Type 2 diabetes mellitus with hyperglycemia (HCC)  Mixed hyperlipidemia  Essential hypertension  Obesity, Class III, BMI 40-49.9 (morbid obesity) (HCC)  Principal Problem:   Acute kidney injury superimposed on chronic kidney disease (HCC) Active Problems:   Mixed hyperlipidemia   Essential hypertension   Nausea & vomiting   Type 2 diabetes mellitus with hyperglycemia (HCC)   Obesity, Class III, BMI 40-49.9 (morbid obesity) (HCC)   Hypokalemia   COVID-19 virus infection   Acute diarrhea   Thrombocytopenia (HCC)   Hypoalbuminemia due to protein-calorie malnutrition (HCC)   Atrial fibrillation, chronic (HCC)  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  Continue gentle hydration Renally adjust medications, avoid nephrotoxic agents/dehydration/hypotension  Hypokalemia K+ 3.2, this will be replenished  COVID-19 virus infection [name] was not hypoxic and afebrile Continue symptomatic treatment at this time  Nausea and vomiting Continue Zofran as needed  Acute diarrhea [name] has not had any bowel movement since arrival to the ED Consider obtaining an stool sample for OCD/GI stool panel if [name] continues to have diarrhea  Thrombocytopenia possibly reactive Platelets 147, stable, continue to monitor platelet levels with morning labs  Hypoalbuminemia Albumin 3.4, protein supplement will be provided  Type 2 DM uncontrolled with hyperglycemia Hemoglobin A1c about a month ago was 9.1 Continue Semglee 10 units nightly and adjust dose accordingly Continue ISS and hypoglycemia protocol  Chronic atrial fibrillation Continue carvedilol for heart rate control Continue eliquis   Mixed hyperlipidemia Continue atorvastatin 80 mg daily    Essential Hypertension  Continue home carvedilol    Morbid Obesity (BMI 41.01) Continue diet and lifestyle modification     DVT prophylaxis: Eliquis   Advance Care Planning:   Code Status: Full Code   Consults:  None  Family Communication: None at bedside  Severity of Illness: The appropriate [name] status for this [name] is INPATIENT. Inpatient status is judged to be reasonable and necessary in order to provide the required intensity of service to ensure the [name]'s safety. The [name]'s presenting symptoms, physical exam findings, and initial radiographic and laboratory data in the context of their chronic comorbidities is felt to place them at high risk for further clinical deterioration. Furthermore, it is not anticipated that the [name] will be medically stable for discharge from the hospital within 2 midnights of admission.   * I certify that at the point of admission it is my clinical judgment that the [name] will require inpatient hospital care spanning beyond 2 midnights from the point of admission due to high intensity of service, high risk for further deterioration and high frequency of surveillance required.*  Author: Frankey Shown, DO 06/05/2023 9:20 AM  For on call review [email or web address].

## 2023-06-05 NOTE — ED Provider Notes (Signed)
[place] EMERGENCY DEPARTMENT AT [facility] Provider Note   CSN: [id number] Arrival date & time: 06/04/23  2220     History  Chief Complaint  Patient presents with   Weakness    [name] is a 62 y.o. female.  The history is provided by the patient.  Patient with extensive history including chronic kidney disease, diabetes, obesity, heart failure presents with generalized weakness.  Patient reports she had a recent exposure to COVID from a family member when she has been coughing and feeling generalized weakness.  She also reports over the past several days she has had multiple episodes of nausea vomiting diarrhea that are all nonbloody.  Recently treated for UTI but has not completed therapy. No fevers.  No recent travel She reports feeling generalized weakness and dizziness and had a near syncopal episode but no traumatic injuries She reports decreased urine output and decreased oral intake     Home Medications Prior to Admission medications   Medication Sig Start Date End Date Taking? Authorizing Provider  acetaminophen (TYLENOL) 325 MG tablet Take 325 mg by mouth every 4 (four) hours as needed for mild pain, headache or fever. 05/01/21   [provider]  albuterol (PROAIR HFA) 108 (90 BASE) MCG/ACT inhaler Inhale 1-2 puffs into the lungs every 6 (six) hours as needed for wheezing or shortness of breath.    [provider]  apixaban (ELIQUIS) 5 MG TABS tablet Take 1 tablet (5 mg total) by mouth 2 (two) times daily. 11/17/17   Bethann Berkshire, MD  atorvastatin (LIPITOR) 40 MG tablet Take 40 mg by mouth every morning.    [provider]  carvedilol (COREG) 25 MG tablet Take 0.5 tablets (12.5 mg total) by mouth 2 (two) times daily with a meal. Patient taking differently: Take 12.5 mg by mouth daily with breakfast. 05/03/23   Charolotte Eke, MD  clopidogrel (PLAVIX) 75 MG tablet Take 75 mg by mouth daily.    [provider]   diphenhydrAMINE (BENADRYL) 25 MG tablet Take 25 mg by mouth at bedtime as needed for sleep. 04/25/21   [provider]  gabapentin (NEURONTIN) 300 MG capsule Take 300 mg by mouth 3 (three) times daily.    [provider]  insulin detemir (LEVEMIR) 100 UNIT/ML injection Inject 50 Units into the skin 2 (two) times daily.    [provider]  isosorbide mononitrate (IMDUR) 60 MG 24 hr tablet Take 1 tablet (60 mg total) by mouth every morning. 05/04/23   Charolotte Eke, MD  losartan (COZAAR) 25 MG tablet Take 25 mg by mouth daily. 12/19/22   [provider]  NOVOLOG FLEXPEN 100 UNIT/ML FlexPen Inject 20 Units into the skin 4 (four) times daily. 4-5 times daily 05/13/15   [provider]  sertraline (ZOLOFT) 100 MG tablet Take 100 mg by mouth daily.    [provider]  trazodone (DESYREL) 300 MG tablet Take 300 mg by mouth at bedtime. 03/03/23   [provider]      Allergies    Gelatin, Iodinated contrast media, Methylprednisolone sodium succ, Dicyclomine hcl, Doxycycline, Glutamic acid, Ketorolac, Lisinopril, Metformin, Nitroglycerin, Metformin and related, Nsaids, Other, Quetiapine, Vancomycin, Blueberry flavor, Cephalexin, Dicyclomine, Fish allergy, Ibuprofen, Ketorolac tromethamine, Nitrofuran derivatives, Nitrofurantoin, Nitrofurantoin monohyd macro, Shellfish allergy, Strawberry extract, Strawberry flavor, and Sulfa antibiotics    Review of Systems   Review of Systems  Constitutional:  Positive for fatigue.  Gastrointestinal:  Positive for diarrhea and vomiting. Negative for  blood in stool.  Neurological:  Positive for weakness.    Physical Exam Updated Vital Signs BP (!) 123/52   Pulse 67   Temp 99 F (37.2 C) (Oral)   Resp 20   Ht 1.6 m (5\' 3" )   Wt 111.1 kg   SpO2 99%   BMI 43.39 kg/m  Physical Exam CONSTITUTIONAL: Chronically ill-appearing HEAD: Normocephalic/atraumatic EYES: EOMI NECK: supple no meningeal  signs SPINE/BACK:entire spine nontender CV: S1/S2 noted, no murmurs/rubs/gallops noted LUNGS: Lungs are clear to auscultation bilaterally, no apparent distress ABDOMEN: soft, nontender, no rebound or guarding, bowel sounds noted throughout abdomen GU: Bilateral cva tenderness NEURO: Pt is awake/alert/appropriate, moves all extremitiesx4.  No facial droop.  No arm or leg drift EXTREMITIES: pulses normal/equal, full ROM SKIN: warm, color normal  ED Results / Procedures / Treatments   Labs (all labs ordered are listed, but only abnormal results are displayed) Labs Reviewed  RESP PANEL BY RT-PCR (RSV, FLU A&B, COVID)  RVPGX2 - Abnormal; Notable for the following components:      Result Value   SARS Coronavirus 2 by RT PCR POSITIVE (*)    All other components within normal limits  URINALYSIS, ROUTINE W REFLEX MICROSCOPIC - Abnormal; Notable for the following components:   APPearance CLOUDY (*)    Glucose, UA 50 (*)    Hgb urine dipstick SMALL (*)    Protein, ur 100 (*)    Bacteria, UA MANY (*)    All other components within normal limits  CBC WITH DIFFERENTIAL/PLATELET - Abnormal; Notable for the following components:   RBC 3.22 (*)    Hemoglobin 9.6 (*)    HCT 28.2 (*)    RDW 17.2 (*)    Platelets 147 (*)    All other components within normal limits  COMPREHENSIVE METABOLIC PANEL - Abnormal; Notable for the following components:   Sodium 133 (*)    Potassium 3.2 (*)    CO2 19 (*)    Glucose, Bld 139 (*)    BUN 49 (*)    Creatinine, Ser 5.96 (*)    Calcium 7.9 (*)    Total Protein 8.7 (*)    Albumin 3.4 (*)    AST 14 (*)    GFR, Estimated 8 (*)    All other components within normal limits  CBG MONITORING, ED - Abnormal; Notable for the following components:   Glucose-Capillary 126 (*)    All other components within normal limits    EKG EKG Interpretation Date/Time:  Wednesday June 04 2023 22:51:43 EDT Ventricular Rate:  65 PR Interval:  147 QRS  Duration:  100 QT Interval:  442 QTC Calculation: 460 R Axis:   63  Text Interpretation: Sinus rhythm No significant change since last tracing Confirmed by Zadie Rhine ([id number]) on 06/04/2023 11:39:15 PM  Radiology CT Renal Stone Study  Result Date: 06/05/2023 CLINICAL DATA:  Weakness with nausea and vomiting, initial encounter EXAM: CT ABDOMEN AND PELVIS WITHOUT CONTRAST TECHNIQUE: Multidetector CT imaging of the abdomen and pelvis was performed following the standard protocol without IV contrast. RADIATION DOSE REDUCTION: This exam was performed according to the departmental dose-optimization program which includes automated exposure control, adjustment of the mA and/or kV according to patient size and/or use of iterative reconstruction technique. COMPARISON:  05/28/2023 FINDINGS: Lower chest: No acute abnormality. Hepatobiliary: No focal liver abnormality is seen. Status post cholecystectomy. No biliary dilatation. Pancreas: Unremarkable. No pancreatic ductal dilatation or surrounding inflammatory changes. Spleen: Normal in size without focal abnormality. Adrenals/Urinary Tract: Adrenal  glands are within normal limits. Kidneys demonstrate small nonobstructing renal calculi bilaterally. Ureters are within normal limits. The bladder is decompressed. Stomach/Bowel: No obstructive or inflammatory changes of the colon are seen. The appendix has been surgically removed. Small bowel and stomach are within normal limits. Vascular/Lymphatic: Aortic atherosclerosis. No enlarged abdominal or pelvic lymph nodes. Reproductive: Status post hysterectomy. No adnexal masses. Other: No abdominal wall hernia or abnormality. No abdominopelvic ascites. Musculoskeletal: No acute or significant osseous findings. Stable skin induration is noted in skin overlying the anterior left pelvis. IMPRESSION: No acute abnormality is noted. Bilateral nonobstructing renal calculi. No change from the prior exam. Electronically Signed    By: Alcide Clever M.D.   On: 06/05/2023 01:43   DG Chest Portable 1 View  Result Date: 06/05/2023 CLINICAL DATA:  Cough and weakness EXAM: PORTABLE CHEST 1 VIEW COMPARISON:  05/28/2023 FINDINGS: Cardiac shadow is within normal limits. Lungs are well aerated bilaterally. No focal infiltrate or effusion is seen. No bony abnormality is noted. IMPRESSION: No active disease. Electronically Signed   By: Alcide Clever M.D.   On: 06/05/2023 01:39    Procedures .Critical Care  Performed by: Zadie Rhine, MD Authorized by: Zadie Rhine, MD   Critical care provider statement:    Critical care time (minutes):  35   Critical care start time:  06/05/2023 12:55 AM   Critical care end time:  06/05/2023 1:30 AM   Critical care time was exclusive of:  Separately billable procedures and treating other patients   Critical care was necessary to treat or prevent imminent or life-threatening deterioration of the following conditions:  Metabolic crisis and renal failure   Critical care was time spent personally by me on the following activities:  Re-evaluation of patient's condition, pulse oximetry, ordering and review of radiographic studies, ordering and review of laboratory studies, ordering and performing treatments and interventions, evaluation of patient's response to treatment, examination of patient, obtaining history from patient or surrogate, development of treatment plan with patient or surrogate and review of old charts   I assumed direction of critical care for this patient from another provider in my specialty: no     Care discussed with: admitting provider       Medications Ordered in ED Medications  sodium chloride 0.9 % bolus 1,000 mL (1,000 mLs Intravenous New Bag/Given 06/05/23 0108)  ondansetron (ZOFRAN) injection 4 mg (4 mg Intravenous Given 06/05/23 0109)  fentaNYL (SUBLIMAZE) injection 50 mcg (50 mcg Intravenous Given 06/05/23 0110)  cefTRIAXone (ROCEPHIN) 1 g in sodium chloride 0.9 % 100  mL IVPB (0 g Intravenous Stopped 06/05/23 0150)    ED Course/ Medical Decision Making/ A&P Clinical Course as of 06/05/23 0255  Thu Jun 05, 2023  0042 Creatinine(!): 5.96 Acute kidney injury [DW]  0042 Hemoglobin(!): 9.6 Anemia [DW]  0043 SARS Coronavirus 2 by RT PCR(!): POSITIVE Positive for COVID-19 [DW]  0054 Patient reports recent generalized weakness, cough and vomiting diarrhea.  Patient found to positive for COVID-19.  She also has evidence of acute kidney injury with creatinine near 6.  She will require admission.  Will obtain CT imaging due to her flank pain and reported abdominal pain though her abdominal exam is unremarkable [DW]  0202 All imaging is unremarkable at this time.  Patient feels overall improving, but still has acute renal failure will need to be admitted [DW]  0252 Discussed with Dr. Thomes Dinning for acute on chronic renal failure [DW]    Clinical Course User Index [DW] Zadie Rhine, MD  Medical Decision Making Amount and/or Complexity of Data Reviewed Labs: ordered. Decision-making details documented in ED Course. Radiology: ordered. ECG/medicine tests: ordered.  Risk Prescription drug management. Decision regarding hospitalization.   This patient presents to the ED for concern of generalized weakness, this involves an extensive number of treatment options, and is a complaint that carries with it a high risk of complications and morbidity.  The differential diagnosis includes but is not limited to CVA, intracranial hemorrhage, acute coronary syndrome, renal failure, urinary tract infection, electrolyte disturbance, pneumonia   Comorbidities that complicate the patient evaluation: Patient's presentation is complicated by their history of CHF, obesity  Social Determinants of Health: Patient's  lack of transportation   increases the complexity of managing their presentation  Additional history obtained: Records reviewed  previous admission documents  Lab Tests: I Ordered, and personally interpreted labs.  The pertinent results include: Acute kidney injury, anemia, positive for COVID-19  Imaging Studies ordered: I ordered imaging studies including CT scan renal and X-ray chest   I independently visualized and interpreted imaging which showed no acute findings I agree with the radiologist interpretation  Cardiac Monitoring: The patient was maintained on a cardiac monitor.  I personally viewed and interpreted the cardiac monitor which showed an underlying rhythm of:  sinus rhythm  Medicines ordered and prescription drug management: I ordered medication including Zofran for nausea Fentanyl for pain Reevaluation of the patient after these medicines showed that the patient    improved  Critical Interventions:   IV fluids, IV antibiotics for possible UTI  Consultations Obtained: I requested consultation with the admitting physician Dr. Thomes Dinning , and discussed  findings as well as pertinent plan - they recommend: Admit  Reevaluation: After the interventions noted above, I reevaluated the patient and found that they have :improved  Complexity of problems addressed: Patient's presentation is most consistent with  acute presentation with potential threat to life or bodily function  Disposition: After consideration of the diagnostic results and the patient's response to treatment,  I feel that the patent would benefit from admission   .           Final Clinical Impression(s) / ED Diagnoses Final diagnoses:  Weakness  Dehydration  AKI (acute kidney injury) (HCC)  COVID-19    Rx / DC Orders ED Discharge Orders     None         Zadie Rhine, MD 06/05/23 [phone]

## 2023-06-05 NOTE — Progress Notes (Signed)
Patient admitted by Dr. Thomes Dinning this morning  62 year old with history of HTN, HLD, CAD status post PCI, DVT on Eliquis comes to the ED with generalized weakness.  Patient recently exposed to COVID and now she is being admitted for COVID-19, dehydration and acute kidney injury.  CT abdomen pelvis is overall unremarkable.  No other new complaints besides generalized weakness Vitals are overall stable.  AKI on CKD stage V -Baseline creatinine 2.3, admission creatinine 5.96.  Prerenal.  IV fluids.  Avoid nephrotoxic drugs.  Hypokalemia - As needed repletion  COVID-19 infection - Supportive care.  No signs of shortness of breath will place on as needed bronchodilators.  Diarrhea - Suspect from COVID-19.  Supportive care  Other medical conditions reviewed and stable: Diabetes mellitus type 2 currently on insulin and sliding scale Chronic atrial fibrillation on Coreg and Eliquis Hyperlipidemia on Lipitor Essential hypertension continue Coreg and start IV as needed meds  Stephania Fragmin MD [facility]

## 2023-06-05 NOTE — Plan of Care (Signed)
Problem: Education: Goal: Understanding of CV disease, CV risk reduction, and recovery process will improve Outcome: Progressing Goal: Individualized Educational Video(s) Outcome: Progressing   Problem: Activity: Goal: Ability to return to baseline activity level will improve Outcome: Progressing   Problem: Cardiovascular: Goal: Ability to achieve and maintain adequate cardiovascular perfusion will improve Outcome: Progressing Goal: Vascular access site(s) Level 0-1 will be maintained Outcome: Progressing   Problem: Health Behavior/Discharge Planning: Goal: Ability to safely manage health-related needs after discharge will improve Outcome: Progressing   Problem: Education: Goal: Knowledge of General Education information will improve Description: Including pain rating scale, medication(s)/side effects and non-pharmacologic comfort measures Outcome: Progressing   Problem: Health Behavior/Discharge Planning: Goal: Ability to manage health-related needs will improve Outcome: Progressing   Problem: Clinical Measurements: Goal: Ability to maintain clinical measurements within normal limits will improve Outcome: Progressing Goal: Will remain free from infection Outcome: Progressing Goal: Diagnostic test results will improve Outcome: Progressing Goal: Respiratory complications will improve Outcome: Progressing Goal: Cardiovascular complication will be avoided Outcome: Progressing   Problem: Activity: Goal: Risk for activity intolerance will decrease Outcome: Progressing   Problem: Nutrition: Goal: Adequate nutrition will be maintained Outcome: Progressing   Problem: Coping: Goal: Level of anxiety will decrease Outcome: Progressing   Problem: Elimination: Goal: Will not experience complications related to bowel motility Outcome: Progressing Goal: Will not experience complications related to urinary retention Outcome: Progressing   Problem: Pain Managment: Goal:  General experience of comfort will improve Outcome: Progressing   Problem: Safety: Goal: Ability to remain free from injury will improve Outcome: Progressing   Problem: Skin Integrity: Goal: Risk for impaired skin integrity will decrease Outcome: Progressing   Problem: Education: Goal: Ability to describe self-care measures that may prevent or decrease complications (Diabetes Survival Skills Education) will improve Outcome: Progressing Goal: Individualized Educational Video(s) Outcome: Progressing   Problem: Coping: Goal: Ability to adjust to condition or change in health will improve Outcome: Progressing   Problem: Fluid Volume: Goal: Ability to maintain a balanced intake and output will improve Outcome: Progressing   Problem: Health Behavior/Discharge Planning: Goal: Ability to identify and utilize available resources and services will improve Outcome: Progressing Goal: Ability to manage health-related needs will improve Outcome: Progressing   Problem: Metabolic: Goal: Ability to maintain appropriate glucose levels will improve Outcome: Progressing   Problem: Nutritional: Goal: Maintenance of adequate nutrition will improve Outcome: Progressing Goal: Progress toward achieving an optimal weight will improve Outcome: Progressing   Problem: Skin Integrity: Goal: Risk for impaired skin integrity will decrease Outcome: Progressing   Problem: Tissue Perfusion: Goal: Adequacy of tissue perfusion will improve Outcome: Progressing

## 2023-06-06 ENCOUNTER — Inpatient Hospital Stay (HOSPITAL_COMMUNITY): Payer: Medicaid - Out of State

## 2023-06-06 DIAGNOSIS — D61818 Other pancytopenia: Secondary | ICD-10-CM | POA: Insufficient documentation

## 2023-06-06 DIAGNOSIS — U071 COVID-19: Secondary | ICD-10-CM | POA: Diagnosis not present

## 2023-06-06 DIAGNOSIS — Z9861 Coronary angioplasty status: Secondary | ICD-10-CM

## 2023-06-06 DIAGNOSIS — I2510 Atherosclerotic heart disease of native coronary artery without angina pectoris: Secondary | ICD-10-CM

## 2023-06-06 DIAGNOSIS — E871 Hypo-osmolality and hyponatremia: Secondary | ICD-10-CM

## 2023-06-06 DIAGNOSIS — I482 Chronic atrial fibrillation, unspecified: Secondary | ICD-10-CM | POA: Diagnosis not present

## 2023-06-06 DIAGNOSIS — D649 Anemia, unspecified: Secondary | ICD-10-CM | POA: Insufficient documentation

## 2023-06-06 DIAGNOSIS — I10 Essential (primary) hypertension: Secondary | ICD-10-CM | POA: Diagnosis not present

## 2023-06-06 DIAGNOSIS — N179 Acute kidney failure, unspecified: Secondary | ICD-10-CM | POA: Diagnosis not present

## 2023-06-06 LAB — BASIC METABOLIC PANEL
Anion gap: 13 (ref 5–15)
BUN: 49 mg/dL — ABNORMAL HIGH (ref 6–20)
CO2: 17 mmol/L — ABNORMAL LOW (ref 22–32)
Calcium: 7.1 mg/dL — ABNORMAL LOW (ref 8.9–10.3)
Chloride: 105 mmol/L (ref 98–111)
Creatinine, Ser: 7.75 mg/dL — ABNORMAL HIGH (ref 0.44–1.00)
GFR, Estimated: 6 mL/min — ABNORMAL LOW (ref >60)
Glucose, Bld: 104 mg/dL — ABNORMAL HIGH (ref 70–99)
Potassium: 3.4 mmol/L — ABNORMAL LOW (ref 3.5–5.1)
Sodium: 135 mmol/L (ref 135–145)

## 2023-06-06 LAB — CBC
HCT: 24.8 % — ABNORMAL LOW (ref 36.0–46.0)
Hemoglobin: 8.3 g/dL — ABNORMAL LOW (ref 12.0–15.0)
MCH: 29.5 pg (ref 26.0–34.0)
MCHC: 33.5 g/dL (ref 30.0–36.0)
MCV: 88.3 fL (ref 80.0–100.0)
Platelets: 121 10*3/uL — ABNORMAL LOW (ref 150–400)
RBC: 2.81 MIL/uL — ABNORMAL LOW (ref 3.87–5.11)
RDW: 17.2 % — ABNORMAL HIGH (ref 11.5–15.5)
WBC: 3.7 10*3/uL — ABNORMAL LOW (ref 4.0–10.5)
nRBC: 0 % (ref 0.0–0.2)

## 2023-06-06 LAB — IRON AND TIBC
Iron: 37 ug/dL (ref 28–170)
Saturation Ratios: 18 % (ref 10.4–31.8)
TIBC: 209 ug/dL — ABNORMAL LOW (ref 250–450)
UIBC: 172 ug/dL

## 2023-06-06 LAB — GLUCOSE, CAPILLARY
Glucose-Capillary: 109 mg/dL — ABNORMAL HIGH (ref 70–99)
Glucose-Capillary: 154 mg/dL — ABNORMAL HIGH (ref 70–99)
Glucose-Capillary: 130 mg/dL — ABNORMAL HIGH (ref 70–99)
Glucose-Capillary: 126 mg/dL — ABNORMAL HIGH (ref 70–99)

## 2023-06-06 LAB — FERRITIN: Ferritin: 374 ng/mL — ABNORMAL HIGH (ref 11–307)

## 2023-06-06 LAB — MAGNESIUM: Magnesium: 1.7 mg/dL (ref 1.7–2.4)

## 2023-06-06 LAB — PHOSPHORUS: Phosphorus: 7.8 mg/dL — ABNORMAL HIGH (ref 2.5–4.6)

## 2023-06-06 MED ORDER — SEVELAMER CARBONATE 800 MG PO TABS
1600.0000 mg | ORAL_TABLET | Freq: Three times a day (TID) | ORAL | Status: DC
  Administered 2023-06-06 – 2023-06-08 (×6): 1600 mg via ORAL
  Filled 2023-06-06 (×6): qty 2

## 2023-06-06 MED ORDER — LOPERAMIDE HCL 2 MG PO CAPS: 2.0000 mg | ORAL_CAPSULE | ORAL | Status: DC | PRN

## 2023-06-06 MED ORDER — PROCHLORPERAZINE EDISYLATE 10 MG/2ML IJ SOLN
10.0000 mg | Freq: Four times a day (QID) | INTRAMUSCULAR | Status: DC | PRN
  Administered 2023-06-06 – 2023-06-07 (×4): 10 mg via INTRAVENOUS
  Filled 2023-06-06 (×4): qty 2

## 2023-06-06 MED ORDER — SODIUM CHLORIDE 0.9 % IV SOLN: INTRAVENOUS | Status: DC

## 2023-06-06 NOTE — Consult Note (Signed)
[facility] Renal Consultation Note  Requesting MD: Joen Laura, MD Indication for Consultation:  AKI   Chief complaint: weakness and n/v/d  HPI:  [name] is a 62 y.o. female with a history of atrial fibrillation, bipolar disorder, diabetes mellitus, hypertension, and CKD who presented to the hospital with generalized weakness and nausea, vomiting, and diarrhea.  She had a close contact who was infected with COVID.  She was diagnosed with COVID-19 infection.  Her course has been complicated by AKI with a creatinine of 5.96 on presentation which is risen to 7.75 on the date of consult.  Nephrology is consulted for assistance with management of acute kidney injury on CKD stage IV.  She was started on fluids yesterday - NS at 75 ml/hr - these are now off.  There is no urine output charted. She is on room air. She had renal US without hydro and normal echogenicity.  Baseline Cr 2.0 - 2.5 most recently as below.  Her renal function has worsened over the past year.  She denies NSAID use.  States n/v at home and diarrhea - initially got better now back again.  Urinated more today than she had been.  No difficulty with urination.  She and I discussed the risks/benefits/indications for dialysis and she would want dialysis if she developed an indication.  We discussed no current indication but may be coming as soon as the next 24-48 hours.    Creatinine, Ser  Date/Time Value Ref Range Status  06/06/2023 04:50 AM 7.75 (H) 0.44 - 1.00 mg/dL Final  07/13/2535 64:40 PM 6.54 (H) 0.44 - 1.00 mg/dL Final  34/74/2595 63:87 PM 5.96 (H) 0.44 - 1.00 mg/dL Final  56/43/3295 18:84 PM 1.92 (H) 0.44 - 1.00 mg/dL Final  16/60/6301 60:10 AM 2.30 (H) 0.44 - 1.00 mg/dL Final  93/23/5573 22:02 AM 2.42 (H) 0.44 - 1.00 mg/dL Final  54/27/0623 76:28 AM 2.65 (H) 0.44 - 1.00 mg/dL Final  31/51/7616 07:37 AM 2.49 (H) 0.44 - 1.00 mg/dL Final  10/62/6948 54:62 AM 2.76 (H) 0.44 - 1.00 mg/dL Final   70/35/0093 81:82 AM 2.78 (H) 0.44 - 1.00 mg/dL Final  99/37/1696 78:93 AM 2.75 (H) 0.44 - 1.00 mg/dL Final  81/09/7508 25:85 AM 2.62 (H) 0.44 - 1.00 mg/dL Final  27/78/2423 53:61 AM 2.52 (H) 0.44 - 1.00 mg/dL Final  44/31/5400 86:76 AM 2.56 (H) 0.44 - 1.00 mg/dL Final  19/50/9326 71:24 AM 2.53 (H) 0.44 - 1.00 mg/dL Final  58/05/9832 82:50 AM 2.33 (H) 0.44 - 1.00 mg/dL Final  53/97/6734 19:37 AM 2.25 (H) 0.44 - 1.00 mg/dL Final  90/24/0973 53:29 AM 2.17 (H) 0.44 - 1.00 mg/dL Final  92/42/6834 19:62 AM 2.07 (H) 0.44 - 1.00 mg/dL Final  22/97/9892 11:94 AM 1.96 (H) 0.44 - 1.00 mg/dL Final  17/40/8144 81:85 AM 1.85 (H) 0.44 - 1.00 mg/dL Final  63/14/9702 63:78 AM 1.89 (H) 0.44 - 1.00 mg/dL Final  58/85/0277 41:28 PM 1.94 (H) 0.44 - 1.00 mg/dL Final  78/67/6720 94:70 PM 1.49 (H) 0.44 - 1.00 mg/dL Final  96/28/3662 94:76 PM 1.41 (H) 0.44 - 1.00 mg/dL Final  54/65/0354 65:68 PM 1.27 (H) 0.44 - 1.00 mg/dL Final  12/75/1700 17:49 PM 1.24 (H) 0.44 - 1.00 mg/dL Final  44/96/7591 63:84 AM 1.15 (H) 0.44 - 1.00 mg/dL Final  66/59/9357 01:77 AM 1.13 (H) 0.44 - 1.00 mg/dL Final  93/90/3009 23:30 PM 1.16 (H) 0.44 - 1.00 mg/dL Final  07/62/2633 35:45 PM 0.96 0.44 - 1.00 mg/dL Final  62/56/3893  03:38 PM 0.94 0.44 - 1.00 mg/dL Final  40/06/2724 36:64 PM 0.98 0.44 - 1.00 mg/dL Final  40/34/7425 95:63 PM 0.95 0.44 - 1.00 mg/dL Final  87/56/4332 95:18 PM 1.44 (H) 0.44 - 1.00 mg/dL Final  84/16/6063 01:60 AM 0.86 0.50 - 1.10 mg/dL Final  10/93/2355 73:22 AM 1.00 0.50 - 1.10 mg/dL Final  02/54/2706 23:76 AM 0.90 0.50 - 1.10 mg/dL Final  28/31/5176 16:07 PM 0.75 0.50 - 1.10 mg/dL Final  37/06/6268 48:54 AM 0.81 0.50 - 1.10 mg/dL Final  62/70/3500 93:81 PM 0.73 0.4 - 1.2 mg/dL Final  82/99/3716 96:78 PM 0.67 0.4 - 1.2 mg/dL Final  93/81/0175 10:25 PM 0.92 0.4 - 1.2 mg/dL Final  85/27/7824 23:53 PM 0.82  Final  12/04/2007 10:45 PM 0.8  Final  12/04/2007 10:40 PM 0.84  Final  03/17/2007 05:12 PM 0.87   Final     PMHx:   Past Medical History:  Diagnosis Date   Asthma    Atrial fibrillation (HCC)    Bipolar 1 disorder (HCC)    Chronic back pain    Chronic chest pain    Diabetes mellitus without complication (HCC)    Gastroparesis    GERD (gastroesophageal reflux disease)    Hyperlipemia    Hypertension    IBS (irritable bowel syndrome)    Normal cardiac stress test 02/2014   [facility]    Past Surgical History:  Procedure Laterality Date   ABDOMINAL HYSTERECTOMY  2001   APPENDECTOMY  10/2005   CHOLECYSTECTOMY  10/2005   COLONOSCOPY  2007   Patel ([name]pt reports hemorrhoids   ESOPHAGOGASTRODUODENOSCOPY  02/28/2006   [name], normal on daily PPI, myultiple hyperplastic polyps   MOUTH SURGERY     RIGHT HEART CATH N/A 04/25/2023   Procedure: RIGHT HEART CATH;  Surgeon: Kathleene Hazel, MD;  Location: [facility] INVASIVE CV LAB;  Service: Cardiovascular;  Laterality: N/A;   RIGHT OOPHORECTOMY  1999    Family Hx:  Family History  Problem Relation Age of Onset   Cirrhosis Mother 25       ?etiology   Cirrhosis Father        ?etiology   Cirrhosis Sister 30       ?etiology   Diverticulitis Sister   No family history of CKD  Social History:  reports that she is a non-smoker but has been exposed to tobacco smoke. She has never used smokeless tobacco. She reports that she does not drink alcohol and does not use drugs.  Allergies:  Allergies  Allergen Reactions   Gelatin Swelling    No jello of any kind   Iodinated Contrast Media Anaphylaxis, Hives, Itching and Swelling   Methylprednisolone Sodium Succ Shortness Of Breath   Dicyclomine Hcl Anxiety and Other (See Comments)    "shaky and sick" GI upset, sick to stomach   Doxycycline Hives and Rash    rash   Glutamic Acid Itching and Swelling   Ketorolac Hives and Rash    Tolerated Toradol in the ED without side effect, hives, or complaint on 02/17/2022   Lisinopril Swelling    Angioedema (facial, lip, or  tongue swelling)   Metformin Nausea And Vomiting and Other (See Comments)    Dyspepsia sick to stomach "It made me sick where I couldn't get out of the bed"   Nitroglycerin Nausea And Vomiting and Swelling    Pt states it makes her too sick   Nsaids Other (See Comments)    Stomach pain/bleeding   Other Itching, Swelling  and Other (See Comments)    Solu-Medrol Mix-O-Vial   Quetiapine Other (See Comments)   Vancomycin     Itching and erythema at infusion site within a few minutes of starting infusion. No systemic evidence of Red Man Syndrome   Blueberry Flavor Nausea And Vomiting and Rash   Cephalexin Nausea And Vomiting and Nausea Only    sick to stomach   Fish Allergy Rash   Ibuprofen Nausea And Vomiting    GI upset Reports stomach upset 2/2 hx gastric polyps. Not true allergy.   Nitrofuran Derivatives Itching and Nausea And Vomiting    sick to stomach   Shellfish Allergy Itching, Swelling and Rash   Strawberry Flavor Nausea And Vomiting and Rash   Sulfa Antibiotics Nausea And Vomiting and Other (See Comments)    "It just makes me real sick"    Medications: Prior to Admission medications   Medication Sig Start Date End Date Taking? Authorizing Provider  acetaminophen (TYLENOL) 325 MG tablet Take 325 mg by mouth every 4 (four) hours as needed for mild pain, headache or fever. 05/01/21  Yes [provider]  apixaban (ELIQUIS) 5 MG TABS tablet Take 1 tablet (5 mg total) by mouth 2 (two) times daily. 11/17/17  Yes Bethann Berkshire, MD  atorvastatin (LIPITOR) 40 MG tablet Take 40 mg by mouth every morning.   Yes [provider]  carvedilol (COREG) 25 MG tablet Take 0.5 tablets (12.5 mg total) by mouth 2 (two) times daily with a meal. Patient taking differently: Take 12.5 mg by mouth daily with breakfast. 05/03/23  Yes Charolotte Eke, MD  clonazePAM (KLONOPIN) 1 MG tablet Take 1 mg by mouth 2 (two) times daily.   Yes [provider]  clopidogrel (PLAVIX) 75 MG  tablet Take 75 mg by mouth daily.   Yes [provider]  diphenhydrAMINE (BENADRYL) 25 MG tablet Take 25 mg by mouth at bedtime as needed for sleep. 04/25/21  Yes [provider]  DM-Doxylamine-Acetaminophen (NYQUIL HBP COLD & FLU PO) Take 15 mLs by mouth at bedtime as needed (congestion, sore throat, cold).   Yes [provider]  insulin detemir (LEVEMIR) 100 UNIT/ML injection Inject 50 Units into the skin 2 (two) times daily.   Yes [provider]  NOVOLOG FLEXPEN 100 UNIT/ML FlexPen Inject 20 Units into the skin 4 (four) times daily. 4-5 times daily 05/13/15  Yes [provider]  sertraline (ZOLOFT) 100 MG tablet Take 100 mg by mouth daily.   Yes [provider]  trazodone (DESYREL) 300 MG tablet Take 300 mg by mouth at bedtime. 03/03/23  Yes [provider]  gabapentin (NEURONTIN) 300 MG capsule Take 300 mg by mouth 3 (three) times daily. Patient not taking: Reported on 06/05/2023    [provider]  isosorbide mononitrate (IMDUR) 60 MG 24 hr tablet Take 1 tablet (60 mg total) by mouth every morning. Patient not taking: Reported on 06/05/2023 05/04/23   Charolotte Eke, MD    I have reviewed the patient's current and reported prior to admission medications.  Labs:     Latest Ref Rng & Units 06/06/2023    4:50 AM 06/05/2023   12:29 PM 06/04/2023   11:54 PM  BMP  Glucose 70 - 99 mg/dL 161  096  045   BUN 6 - 20 mg/dL 49  48  49   Creatinine 0.44 - 1.00 mg/dL 4.09  8.11  9.14   Sodium 135 - 145 mmol/L 135  134  133  Potassium 3.5 - 5.1 mmol/L 3.4  3.6  3.2   Chloride 98 - 111 mmol/L 105  103  100   CO2 22 - 32 mmol/L 17  19  19    Calcium 8.9 - 10.3 mg/dL 7.1  7.3  7.9     Urinalysis    Component Value Date/Time   COLORURINE YELLOW 06/04/2023 2252   APPEARANCEUR CLOUDY (A) 06/04/2023 2252   LABSPEC 1.014 06/04/2023 2252   PHURINE 5.0 06/04/2023 2252   GLUCOSEU 50 (A) 06/04/2023 2252   HGBUR SMALL (A) 06/04/2023 2252    BILIRUBINUR NEGATIVE 06/04/2023 2252   KETONESUR NEGATIVE 06/04/2023 2252   PROTEINUR 100 (A) 06/04/2023 2252   UROBILINOGEN 0.2 12/17/2011 2258   NITRITE NEGATIVE 06/04/2023 2252   LEUKOCYTESUR NEGATIVE 06/04/2023 2252     ROS:  Pertinent items noted in HPI and remainder of comprehensive ROS otherwise negative.  Physical Exam: Vitals:   06/05/23 2132 06/06/23 0413  BP: 107/89 (!) 126/52  Pulse: 68 66  Resp: 18 18  Temp: 98 F (36.7 C) 98 F (36.7 C)  SpO2: 100% 97%     General:  adult female in bed in NAD at rest   HEENT: NCAT Eyes: EOMI sclera anicteric Neck: supple trachea midline  Heart: S1S2 no rub Lungs: clear to auscultation; normal work of breathing on room air  Abdomen: soft/nt/nd Extremities: no edema; no cyanosis or clubbing Skin: no rash on extremities exposed Neuro: alert and oriented x 3 provides hx and follows commands Psych normal mood and affect GU no foley   Assessment/Plan:  # AKI  - secondary to ischemic and pre-renal insults in the setting of covid - fluids as tolerated  - resume normal saline at 100 ml/hr - ordered strict ins/outs - check post-void residual bladder scan and place a foley if retains over 250 mL urine - no acute indication for dialysis however she may develop a need if her renal function does not improve   # CKD stage IV - Baseline Cr 2-2.5 most recently - creatinine has worsened over the past year.   # Covid 19 infection  - Therapies per primary team   # HTN  - acceptable control; avoid hypotension   # metabolic acidosis  - Secondary to AKI with covid   # Metabolic bone disease with hyperphosphatemia  - Check intact PTH - Start renvela with meals  - follow phos   # Anemia of CKD  - Check iron stores - anticipate will need ESA - yesterday was near goal but dropped today  Disposition - continue inpatient monitoring.  Nephrology will review her labs and chart over the weekend.  Please reach out if any questions     Estanislado Emms 06/06/2023, 11:41 AM

## 2023-06-06 NOTE — Assessment & Plan Note (Signed)
No hypoxia or chest infiltrates.  No indication for antivirals or steroids. - Supportive care

## 2023-06-06 NOTE — Assessment & Plan Note (Addendum)
Baseline ~CKD IIIa or b.  Cr ~1.2 in the last 2 years since NSTEMI.  Admitted last month for 2 weeks, during that stay, Cr up to mid 2s.    Nephrology consulted at that time, UA bland, plain renal US unremarkable, thought to have progression of her CKD, plans for renal duplex US as outpatient.    UA this time with mild protein, some bacteria, no signfiicant sediment.   - Continue IV fluids - Consult Nephrology - Avoid nephrotoxins - Strict urine measurement

## 2023-06-06 NOTE — Progress Notes (Signed)
Progress Note   Patient: [name] [id number] DOB: [birth year] DOA: 06/04/2023     1 DOS: the patient was seen and examined on 06/06/2023 at 8:42 AM      Brief hospital course: Mrs. Kitzman is a 62 y.o. F with Bipolar, CAD s/p PCI 2022, CKD IIIa baseline 1.2 until July of this year, hx AF and hx DVT on Eliquis, obesity, DM, HFpEF and HTN who presented with malaise and SOB with exertion.  In the ER, found to have COVID and Cr up to 5.9 (from discharge Cr 2.3  Admitted and started on fluids.  CXR clear, not on O2.     Assessment and Plan: * Acute kidney injury superimposed on chronic kidney disease (HCC) Baseline ~CKD IIIa or b.  Cr ~1.2 in the last 2 years since NSTEMI.  Admitted last month for 2 weeks, during that stay, Cr up to mid 2s.    Nephrology consulted at that time, UA bland, plain renal US unremarkable, thought to have progression of her CKD, plans for renal duplex US as outpatient.    UA this time with mild protein, some bacteria, no signfiicant sediment.   - Continue IV fluids - Consult Nephrology - Avoid nephrotoxins - Strict urine measurement    COVID-19 virus infection No hypoxia or chest infiltrates.  No indication for antivirals or steroids. - Supportive care  Pancytopenia (HCC) Likely due to COVID, mild leukopenia, marked anemia, and mild thrombocytopenia.  Atrial fibrillation, chronic (HCC) - Continue Eliquis, carvedilol  Hypoalbuminemia due to protein-calorie malnutrition (HCC)    Thrombocytopenia (HCC) Mild, stable, no clinical bleeding  Hypokalemia - Hold potassium for now given worsening renal failure  Hyponatremia In the setting of COVID, renal failure.  Mild, asymptomatic. - Continue IV fluids for now -SSRI is being held  Chronic diastolic heart failure (HCC) No pitting edema, no hypoxia, not on diuretics at baseline - Monitor urine output   Iron deficiency anemia Hgb 8 and stable.  Iron studies last month showed ferritin <20  and iron sat 10% - Oral iron  Obesity, Class III, BMI 40-49.9 (morbid obesity) (HCC) BMI 41  Type 2 diabetes mellitus with hyperglycemia (HCC) Glucose adequately controlled - Continue glargine - Continue sliding scale correction insulin - Hold gabapentin given renal failure  Coronary artery disease with angina - Continue Lipitor, Eliquis, Coreg - Hold Plavix, Imdur  Essential hypertension Blood pressure controlled - Continue Coreg  DISORDER, BIPOLAR NOS - Hold home clonazepam, sertraline - Continue home trazodone  Mixed hyperlipidemia - Continue Lipitor          Subjective: Patient is feeling quite nauseated, still having frequent diarrhea, feeling generalized malaise and weakness.  No confusion, no respiratory distress, no dyspnea, no orthopnea, no swelling.     Physical Exam: BP (!) 126/52 (BP Location: Right Arm)   Pulse 66   Temp 98 F (36.7 C)   Resp 18   Ht 5\' 3"  (1.6 m)   Wt 105 kg   SpO2 97%   BMI 41.01 kg/m   Obese adult female, lying in bed, appears weak and tired RRR, no murmurs, no peripheral edema, no JVP visible due to body habitus Respiratory rate normal, lungs clear without rales or wheezes, overall severely diminished and limited lung exam however Abdomen with mild nonfocal tenderness to palpation, no rigidity, no guarding Attention normal, affect normal, judgment and insight appear normal, face symmetric, speech fluent, generalized weakness, oriented to person, place, time     Data Reviewed: Cussed with nephrology  Basic metabolic panel shows worsening creatinine, slight hypokalemia and hyponatremia Shows mild progressive pancytopenia  Family Communication: None present    Disposition: Status is: Inpatient Patient was admitted with AKI in setting of COVID.  Slightly worse. Consult nephrology.           Author: Alberteen Sam, MD 06/06/2023 8:55 AM  For on call review [email or web address].

## 2023-06-06 NOTE — Assessment & Plan Note (Signed)
BMI 41

## 2023-06-06 NOTE — Assessment & Plan Note (Signed)
-   Hold potassium for now given worsening renal failure

## 2023-06-06 NOTE — Assessment & Plan Note (Signed)
-   Hold home clonazepam, sertraline - Continue home trazodone

## 2023-06-06 NOTE — Plan of Care (Signed)
  Problem: Education: Goal: Knowledge of General Education information will improve Description: Including pain rating scale, medication(s)/side effects and non-pharmacologic comfort measures Outcome: Progressing   Problem: Health Behavior/Discharge Planning: Goal: Ability to manage health-related needs will improve Outcome: Progressing   Problem: Education: Goal: Knowledge of risk factors and measures for prevention of condition will improve Outcome: Progressing   Problem: Coping: Goal: Psychosocial and spiritual needs will be supported Outcome: Progressing   Problem: Respiratory: Goal: Will maintain a patent airway Outcome: Progressing Goal: Complications related to the disease process, condition or treatment will be avoided or minimized Outcome: Progressing   Problem: Respiratory: Goal: Will maintain a patent airway Outcome: Progressing Goal: Complications related to the disease process, condition or treatment will be avoided or minimized Outcome: Progressing

## 2023-06-06 NOTE — Assessment & Plan Note (Signed)
Glucose adequately controlled - Continue glargine - Continue sliding scale correction insulin - Hold gabapentin given renal failure

## 2023-06-06 NOTE — Progress Notes (Signed)
Pt had one vomiting episode this morning. Zofran given, pt resting at this time.

## 2023-06-06 NOTE — Assessment & Plan Note (Signed)
Hgb 8 and stable.  Iron studies last month showed ferritin <20 and iron sat 10% - Oral iron

## 2023-06-06 NOTE — Assessment & Plan Note (Addendum)
In the setting of COVID, renal failure.  Mild, asymptomatic. - Continue IV fluids for now -SSRI is being held

## 2023-06-06 NOTE — Hospital Course (Addendum)
Mrs. Guidera is a 62 y.o. F with Bipolar, CAD s/p PCI 2022, CKD IIIa baseline 1.2 until July of this year, hx AF and hx DVT on Eliquis, obesity, DM, HFpEF and HTN who presented with malaise and SOB with exertion.  In the ER, found to have COVID and Cr up to 5.9 (from discharge Cr 2.3  Admitted and started on fluids.  CXR clear, not on O2.

## 2023-06-06 NOTE — Assessment & Plan Note (Signed)
Likely due to COVID, mild leukopenia, marked anemia, and mild thrombocytopenia.

## 2023-06-06 NOTE — Plan of Care (Signed)
Problem: Education: Goal: Knowledge of General Education information will improve Description Including pain rating scale, medication(s)/side effects and non-pharmacologic comfort measures Outcome: Progressing   Problem: Health Behavior/Discharge Planning: Goal: Ability to manage health-related needs will improve Outcome: Progressing

## 2023-06-06 NOTE — Progress Notes (Signed)
Pt vomited x2, compazine ordered and given. Echo at bedside.

## 2023-06-06 NOTE — Assessment & Plan Note (Signed)
Mild, stable, no clinical bleeding

## 2023-06-06 NOTE — Assessment & Plan Note (Signed)
-   Continue Eliquis, carvedilol

## 2023-06-06 NOTE — Assessment & Plan Note (Signed)
-  Continue Lipitor °

## 2023-06-06 NOTE — Assessment & Plan Note (Signed)
No pitting edema, no hypoxia, not on diuretics at baseline - Monitor urine output

## 2023-06-06 NOTE — Assessment & Plan Note (Signed)
Blood pressure controlled - Continue Coreg

## 2023-06-06 NOTE — Assessment & Plan Note (Addendum)
-   Continue Lipitor, Eliquis, Coreg - Hold Plavix, Imdur

## 2023-06-06 NOTE — Plan of Care (Signed)
Problem: Education: Goal: Knowledge of General Education information will improve Description: Including pain rating scale, medication(s)/side effects and non-pharmacologic comfort measures Outcome: Progressing   Problem: Health Behavior/Discharge Planning: Goal: Ability to manage health-related needs will improve Outcome: Progressing   Problem: Clinical Measurements: Goal: Ability to maintain clinical measurements within normal limits will improve Outcome: Progressing   Problem: Coping: Goal: Level of anxiety will decrease Outcome: Progressing   Problem: Safety: Goal: Ability to remain free from injury will improve Outcome: Progressing   Problem: Skin Integrity: Goal: Risk for impaired skin integrity will decrease Outcome: Progressing

## 2023-06-07 DIAGNOSIS — N179 Acute kidney failure, unspecified: Secondary | ICD-10-CM | POA: Diagnosis not present

## 2023-06-07 DIAGNOSIS — U071 COVID-19: Secondary | ICD-10-CM | POA: Diagnosis not present

## 2023-06-07 DIAGNOSIS — I10 Essential (primary) hypertension: Secondary | ICD-10-CM | POA: Diagnosis not present

## 2023-06-07 DIAGNOSIS — I482 Chronic atrial fibrillation, unspecified: Secondary | ICD-10-CM | POA: Diagnosis not present

## 2023-06-07 DIAGNOSIS — N3000 Acute cystitis without hematuria: Secondary | ICD-10-CM | POA: Insufficient documentation

## 2023-06-07 LAB — CBC
HCT: 25.1 % — ABNORMAL LOW (ref 36.0–46.0)
Hemoglobin: 8.2 g/dL — ABNORMAL LOW (ref 12.0–15.0)
MCH: 29.1 pg (ref 26.0–34.0)
MCHC: 32.7 g/dL (ref 30.0–36.0)
MCV: 89 fL (ref 80.0–100.0)
Platelets: 130 10*3/uL — ABNORMAL LOW (ref 150–400)
RBC: 2.82 MIL/uL — ABNORMAL LOW (ref 3.87–5.11)
RDW: 17.2 % — ABNORMAL HIGH (ref 11.5–15.5)
WBC: 3.5 10*3/uL — ABNORMAL LOW (ref 4.0–10.5)
nRBC: 0 % (ref 0.0–0.2)

## 2023-06-07 LAB — URINALYSIS, W/ REFLEX TO CULTURE (INFECTION SUSPECTED)
Bilirubin Urine: NEGATIVE
Glucose, UA: NEGATIVE mg/dL
Ketones, ur: NEGATIVE mg/dL
Leukocytes,Ua: NEGATIVE
Nitrite: NEGATIVE
Protein, ur: 30 mg/dL — AB
Specific Gravity, Urine: 1.005 (ref 1.005–1.030)
pH: 6 (ref 5.0–8.0)

## 2023-06-07 LAB — BASIC METABOLIC PANEL
Anion gap: 13 (ref 5–15)
BUN: 54 mg/dL — ABNORMAL HIGH (ref 6–20)
CO2: 15 mmol/L — ABNORMAL LOW (ref 22–32)
Calcium: 7.1 mg/dL — ABNORMAL LOW (ref 8.9–10.3)
Chloride: 109 mmol/L (ref 98–111)
Creatinine, Ser: 9 mg/dL — ABNORMAL HIGH (ref 0.44–1.00)
GFR, Estimated: 5 mL/min — ABNORMAL LOW (ref >60)
Glucose, Bld: 117 mg/dL — ABNORMAL HIGH (ref 70–99)
Potassium: 3.6 mmol/L (ref 3.5–5.1)
Sodium: 137 mmol/L (ref 135–145)

## 2023-06-07 LAB — MAGNESIUM: Magnesium: 1.9 mg/dL (ref 1.7–2.4)

## 2023-06-07 LAB — GLUCOSE, CAPILLARY
Glucose-Capillary: 125 mg/dL — ABNORMAL HIGH (ref 70–99)
Glucose-Capillary: 162 mg/dL — ABNORMAL HIGH (ref 70–99)
Glucose-Capillary: 153 mg/dL — ABNORMAL HIGH (ref 70–99)
Glucose-Capillary: 161 mg/dL — ABNORMAL HIGH (ref 70–99)

## 2023-06-07 LAB — PHOSPHORUS: Phosphorus: 6.8 mg/dL — ABNORMAL HIGH (ref 2.5–4.6)

## 2023-06-07 MED ORDER — CIPROFLOXACIN HCL 250 MG PO TABS
500.0000 mg | ORAL_TABLET | Freq: Every day | ORAL | Status: DC
  Administered 2023-06-07: 500 mg via ORAL
  Filled 2023-06-07: qty 2

## 2023-06-07 MED ORDER — SODIUM BICARBONATE 8.4 % IV SOLN
INTRAVENOUS | Status: DC
  Filled 2023-06-07 (×3): qty 1000

## 2023-06-07 NOTE — Progress Notes (Signed)
Progress Note   Patient: [name] [id number] DOB: 1961-02-05 DOA: 06/04/2023     2 DOS: the patient was seen and examined on 06/07/2023 at 9:20AM      Brief hospital course: [name] is a 62 y.o. F with Bipolar, CAD s/p PCI 2022, CKD IIIa baseline 1.2 until July of this year, hx AF and hx DVT on Eliquis, obesity, DM, HFpEF and HTN who presented with malaise and SOB with exertion.  In the ER, found to have COVID and Cr up to 5.9 (from discharge Cr 2.3  Admitted and started on fluids.  CXR clear, not on O2.     Assessment and Plan: * Acute kidney injury superimposed on chronic kidney disease (HCC) Acute metabolic acidosis due to renal failure Cr worse today.   UOP only 250 cc documented yesterday - IV fluids per Nephrology - Strict I/Os - Consult Nephrology - Avoid nephrotoxins    Acute cystitis - Obtain urine culture - Start Cipro  Hypocalcemia - Defer MBD mgmt to Nephrology - Follow PTH  Atrial fibrillation, chronic (HCC) - Continue Eliquis, carvedilol    Hypokalemia - Hold potassium for now given worsening renal failure  Hyponatremia Mild - Continue IVF - Hold SSRI  Chronic diastolic heart failure (HCC) No pitting edema, no hypoxia, not on diuretics at baseline   Iron deficiency anemia Hgb 8 and stable.  Iron studies last month showed ferritin <20 and iron sat 10% - Oral iron  Type 2 diabetes mellitus with hyperglycemia (HCC) Glucose controlled - Continue glargine, may need to reduce - Continue sliding scale correction insulin - Hold gabapentin given renal failure  Coronary artery disease with angina - Continue Lipitor, Eliquis, Coreg - Hold Plavix, Imdur  Essential hypertension Blood pressure controlled - Continue Coreg  DISORDER, BIPOLAR NOS - Hold home clonazepam, sertraline - Continue home trazodone  Mixed hyperlipidemia - Continue Lipitor          Subjective: Patient feels fine, tired, she did have some nausea  today, also still having loose stools.  No fever, no respiratory symptoms, no swelling.     Physical Exam: BP (!) 170/72   Pulse 72   Temp 97.8 F (36.6 C) (Oral)   Resp 19   Ht 5\' 3"  (1.6 m)   Wt 105 kg   SpO2 98%   BMI 41.01 kg/m   Obese adult female, lying in bed, interactive and appropriate RRR, no murmurs, no peripheral edema Respiratory normal, lungs clear without rales or wheezes Abdomen soft without tenderness palpation or guarding, no ascites or distention Attention normal, affect appropriate, judgment insight appear normal  Data Reviewed: Metabolic panel shows creatinine up to 9 Mild metabolic acidosis Discussed with nephrology  Family Communication: None    Disposition: Status is: Inpatient Worsening renal failure        Author: Alberteen Sam, MD 06/07/2023 6:18 PM  For on call review [email or web address].

## 2023-06-07 NOTE — Assessment & Plan Note (Signed)
-   Defer MBD mgmt to Nephrology - Follow PTH

## 2023-06-07 NOTE — Progress Notes (Signed)
[facility] Progress Note    Assessment/ Plan:   AKI  - secondary to ischemic and pre-renal insults in the setting of covid. Likely ATN at this point -imaging without obstruction. UA with many wbcs, bacteria, and +yeast. Will check urine culture and start cipro (she was supposed to be taking this prior to admission) - fluids as tolerated (euvolemic on exam),  will stop NS and switch to bicarb gtt 125cc/hr x 1 day -hopefully her kidney function will turn around and improve soon but if not will need to consider renal replacement therapy which she is accepting of. She does not have any absolute indications for RRT at this junction (N/V could also be 2/2 covid?) - Avoid nephrotoxic medications including NSAIDs and iodinated intravenous contrast exposure unless the latter is absolutely indicated.  Preferred narcotic agents for pain control are hydromorphone, fentanyl, and methadone. Morphine should not be used. Avoid Baclofen and avoid oral sodium phosphate and magnesium citrate based laxatives / bowel preps. Continue strict Input and Output monitoring. Will monitor the patient closely with you and intervene or adjust therapy as indicated by changes in clinical status/labs    CKD stage IV - Baseline Cr 2-2.5 most recently - creatinine has worsened over the past year.    Covid 19 infection  - Therapies per primary team    HTN  - acceptable control; avoid hypotension    Metabolic acidosis  - Secondary to AKI with covid, bicarb gtt as above    CKD MBD with hyperphosphatemia  - PTH pending - renvela with meals  - follow phos    Anemia of CKD  Pancytopenia -transfuse prn for hgb <7 -will start ESA -pancytopenia w/u per primary service  Discussed with primary service.  Will not be seen physically tomorrow but I will be monitoring her chart. Will see her if needed. Please call with any questions/concerns.  Anthony Sar, MD [facility]  Subjective:   UOP  charted 250cc overnight Patient seen and examined bedside. Patient reports she feels a whole lot better today as compared to when she came in. Had vomiting yesterday, relieved with antiemetics. Will be trying to eat. She reports that she is urinating a lot,  denies dysuria, hematuria. She reports that she was being treated with cipro for 'kidney infection' up until she came in but could not complete course of abx because of cost. Denies any swelling, cp, sob.   Objective:   BP (!) 157/71 (BP Location: Right Arm)   Pulse 65   Temp 98.3 F (36.8 C) (Oral)   Resp 20   Ht 5\' 3"  (1.6 m)   Wt 105 kg   SpO2 100%   BMI 41.01 kg/m   Intake/Output Summary (Last 24 hours) at 06/07/2023 1404 Last data filed at 06/07/2023 1200 Gross per 24 hour  Intake 2157.13 ml  Output 250 ml  Net 1907.13 ml   Weight change:   Physical Exam: Gen: NAD CVS: RRR Resp: normal wob Abd: obese, soft Ext: no edema Neuro: awake, alert  Imaging: US RENAL  Result Date: 06/06/2023 CLINICAL DATA:  [id number] AKI (acute kidney injury) (HCC) [id number] EXAM: RENAL / URINARY TRACT ULTRASOUND COMPLETE COMPARISON:  June 05, 2023 FINDINGS: Right Kidney: Renal measurements: 11.0 x 4.9 x 5.3 cm = volume: 149 mL. Echogenicity within normal limits. No mass or hydronephrosis visualized. Left Kidney: Renal measurements: 13.1 x 5.6 x 5.4 cm = volume: 204 mL. Echogenicity within normal limits. No mass or hydronephrosis visualized. Bladder: Appears normal for  degree of bladder distention. Other: Increased hepatic echogenicity. Spleen is enlarged spanning 16.7 cm for an estimated volume of 676 ML. IMPRESSION: 1. No hydronephrosis. 2. Hepatic steatosis. 3. Splenomegaly. Electronically Signed   By: Meda Klinefelter M.D.   On: 06/06/2023 10:35    Labs: BMET Recent Labs  Lab 06/04/23 2354 06/05/23 0928 06/05/23 1229 06/06/23 0450 06/07/23 0432  NA 133*  --  134* 135 137  K 3.2*  --  3.6 3.4* 3.6  CL 100  --  103 105 109  CO2 19*   --  19* 17* 15*  GLUCOSE 139*  --  105* 104* 117*  BUN 49*  --  48* 49* 54*  CREATININE 5.96*  --  6.54* 7.75* 9.00*  CALCIUM 7.9*  --  7.3* 7.1* 7.1*  PHOS  --  7.7*  --  7.8* 6.8*   CBC Recent Labs  Lab 06/04/23 2354 06/06/23 0450 06/07/23 0432  WBC 5.8 3.7* 3.5*  NEUTROABS 3.8  --   --   HGB 9.6* 8.3* 8.2*  HCT 28.2* 24.8* 25.1*  MCV 87.6 88.3 89.0  PLT 147* 121* 130*    Medications:     apixaban  5 mg Oral BID   atorvastatin  40 mg Oral q morning   carvedilol  12.5 mg Oral BID WC   feeding supplement (GLUCERNA SHAKE)  237 mL Oral TID BM   insulin aspart  0-15 Units Subcutaneous TID WC   insulin aspart  0-5 Units Subcutaneous QHS   insulin glargine-yfgn  10 Units Subcutaneous QHS   sevelamer carbonate  1,600 mg Oral TID WC      Anthony Sar, MD  [facility] 06/07/2023, 2:04 PM

## 2023-06-07 NOTE — Plan of Care (Signed)
  Problem: Education: Goal: Knowledge of General Education information will improve Description Including pain rating scale, medication(s)/side effects and non-pharmacologic comfort measures Outcome: Progressing   

## 2023-06-08 DIAGNOSIS — N179 Acute kidney failure, unspecified: Secondary | ICD-10-CM | POA: Diagnosis not present

## 2023-06-08 DIAGNOSIS — U071 COVID-19: Secondary | ICD-10-CM | POA: Diagnosis not present

## 2023-06-08 DIAGNOSIS — I5032 Chronic diastolic (congestive) heart failure: Secondary | ICD-10-CM | POA: Insufficient documentation

## 2023-06-08 DIAGNOSIS — I480 Paroxysmal atrial fibrillation: Secondary | ICD-10-CM | POA: Insufficient documentation

## 2023-06-08 DIAGNOSIS — D696 Thrombocytopenia, unspecified: Secondary | ICD-10-CM | POA: Diagnosis not present

## 2023-06-08 LAB — BASIC METABOLIC PANEL
Anion gap: 13 (ref 5–15)
BUN: 58 mg/dL — ABNORMAL HIGH (ref 6–20)
CO2: 19 mmol/L — ABNORMAL LOW (ref 22–32)
Calcium: 7.1 mg/dL — ABNORMAL LOW (ref 8.9–10.3)
Chloride: 104 mmol/L (ref 98–111)
Creatinine, Ser: 9.74 mg/dL — ABNORMAL HIGH (ref 0.44–1.00)
GFR, Estimated: 4 mL/min — ABNORMAL LOW (ref >60)
Glucose, Bld: 162 mg/dL — ABNORMAL HIGH (ref 70–99)
Potassium: 3.6 mmol/L (ref 3.5–5.1)
Sodium: 136 mmol/L (ref 135–145)

## 2023-06-08 LAB — CBC
HCT: 22.2 % — ABNORMAL LOW (ref 36.0–46.0)
Hemoglobin: 7.5 g/dL — ABNORMAL LOW (ref 12.0–15.0)
MCH: 29.8 pg (ref 26.0–34.0)
MCHC: 33.8 g/dL (ref 30.0–36.0)
MCV: 88.1 fL (ref 80.0–100.0)
Platelets: 118 10*3/uL — ABNORMAL LOW (ref 150–400)
RBC: 2.52 MIL/uL — ABNORMAL LOW (ref 3.87–5.11)
RDW: 16.9 % — ABNORMAL HIGH (ref 11.5–15.5)
WBC: 3.4 10*3/uL — ABNORMAL LOW (ref 4.0–10.5)
nRBC: 0 % (ref 0.0–0.2)

## 2023-06-08 LAB — PHOSPHORUS: Phosphorus: 5.3 mg/dL — ABNORMAL HIGH (ref 2.5–4.6)

## 2023-06-08 LAB — GLUCOSE, CAPILLARY
Glucose-Capillary: 138 mg/dL — ABNORMAL HIGH (ref 70–99)
Glucose-Capillary: 127 mg/dL — ABNORMAL HIGH (ref 70–99)

## 2023-06-08 LAB — MAGNESIUM: Magnesium: 1.7 mg/dL (ref 1.7–2.4)

## 2023-06-08 MED ORDER — AMLODIPINE BESYLATE 5 MG PO TABS
2.5000 mg | ORAL_TABLET | Freq: Every day | ORAL | Status: DC
  Administered 2023-06-08: 2.5 mg via ORAL
  Filled 2023-06-08: qty 1

## 2023-06-08 MED ORDER — LAMOTRIGINE 100 MG PO TABS: 100.0000 mg | ORAL_TABLET | Freq: Two times a day (BID) | ORAL | Status: DC

## 2023-06-08 MED ORDER — PROCHLORPERAZINE EDISYLATE 10 MG/2ML IJ SOLN
5.0000 mg | Freq: Four times a day (QID) | INTRAMUSCULAR | Status: DC
  Administered 2023-06-08: 5 mg via INTRAVENOUS
  Filled 2023-06-08 (×2): qty 2

## 2023-06-08 MED ORDER — CEFDINIR 300 MG PO CAPS: 300.0000 mg | ORAL_CAPSULE | Freq: Every day | ORAL | Status: DC

## 2023-06-08 MED ORDER — ISOSORBIDE MONONITRATE ER 60 MG PO TB24
60.0000 mg | ORAL_TABLET | Freq: Every day | ORAL | Status: DC
  Administered 2023-06-08: 60 mg via ORAL
  Filled 2023-06-08: qty 1

## 2023-06-08 MED ORDER — ATORVASTATIN CALCIUM 40 MG PO TABS
80.0000 mg | ORAL_TABLET | Freq: Every morning | ORAL | Status: DC
  Administered 2023-06-08: 80 mg via ORAL
  Filled 2023-06-08: qty 2

## 2023-06-08 MED ORDER — LACTATED RINGERS IV SOLN: INTRAVENOUS | Status: DC

## 2023-06-08 NOTE — Progress Notes (Signed)
Chart and labs reviewed remotely. Cr up to 9.7 from 9 however seems rate of rise is slowing down. CO2 better, will change bicarb gtt to LR provided her volume status is stable. C/w cipro for now. If labs are worsening tomorrow, then will need to start considering initiation of HD. I have briefly discussed this possibility when I saw her yesterday. Please call with any questions/concerns.  Anthony Sar, MD [facility]

## 2023-06-08 NOTE — Discharge Summary (Addendum)
Physician Discharge Summary   Patient: [name] MRN: [id number] DOB: 05/19/1961  Admit date:     06/04/2023  Discharge date: 06-16-23  Discharge Physician: [name]   PCP: Patient, No Pcp Per   LEAVING AGAINST MEDICAL ADVICE    Hospital Course: 62 year old female with a history of CAD s/pDES-RCA 2022 ([place])  , hypertension, hyperlipidemia, T2DM, DVT on Eliquis, paroxysmal atrial fibrillation, bipolar disorder presented on 06/05/2023 with generalized weakness associated with nausea, vomiting, diarrhea.  She had decreased oral intake and some dizziness.  She states that she had not been compliant with her insulins secondary to feeling sick.  In the ED, the patient was afebrile and hemodynamically stable with oxygen saturation 95-98% on room air.  Initial BMP showed sodium 133, potassium 3.2, bicarbonate 19, serum creatinine 5.6. The patient was started on IV fluids.  Unfortunately, her renal function continued to worsen. Nephrology was consulted to assist with management. The patient was started on a bicarbonate drip. The patient's diarrhea ultimately improved.  She remained stable on room air without any shortness of breath.  The patient had continued nausea with decreased oral intake during the hospitalization.     The patient was recently hospitalized from 04/15/2023 to 05/03/2023.  During that hospitalization, the patient had unstable angina.  She was treated with IV heparin for over 7 days.  She was subsequent transferred to [facility] for heart catheterization.  She underwent right heart catheterization on 04/25/2023 which showed pulmonary hypertension and volume overload.  Her cardiac index was 3.2, PA pressure 50/21, RV pressure 48/11.  Given evidence of fluid overload with acute on chronic HFpEF the patient was started on intravenous furosemide and diuresed.  Left heart catheterization was deferred secondary to the patient's acute on chronic kidney disease.  She was continued on aspirin to  1 mg, Lipitor 80 mg, carvedilol 12.5 mg twice daily, Plavix on 5 mg daily, Imdur 90 mg daily.  Aspirin was ultimately stopped as the patient was continued on anticoagulation (apixaban) for atrial fibrillation.  She was continued on amlodipine 5 mg daily for her hypertension in addition to her other cardiac medications. Regarding her renal function, her previous renal baseline was 1.1-1.4.  Serum creatinine progressed to 2.5-2.75.  The patient's serum creatinine ultimately stabilized around 2.3-2.6.  In speaking with [name], she demonstrated the ability to understand the serious nature and consequences of leaving the hospital prior to completion of an adequate course of treatment. She was able to articulate the consequences of leaving prior to completion of adequate therapy, and she understood that leaving could result in bodily harm or even death.    On 2023/06/16, I was informed that [name] wished to leave the hospital against medical advice. [name] stated that he/she wished to leave the hospital prior to completing medical therapy because she felt much better, was able to eat and drink and was uncomfortable and did not want to stay. We dicussed with him/her the nature of his present illness, that she was being kept in the hospital due to ongoing active issues which included: Acute on chronic renal failure, atrial fibrillation, HFpEF.  It was explained that inpatient therapy was still warranted because of the risk of recurrence of Acute on chronic renal failure, atrial fibrillation, HFpEF and that the risks of leaving prior to completion of treatment and preparation of a safe discharge plan would be recurrence of renal failure, HFpEF, resulting in end-organ damage ultimately leading to death. Ultimately, Holly Metz  Marks chose to forgo further treatment in the inpatient setting.        Assessment and Plan: Acute on chronic renal failure--CKD stage IV -Baseline creatinine  2.3-2.6 -Appreciate nephrology follow-up -Serum creatinine continues to gradually rise -Continue bicarbonate drip -Strict I's and O's   COVID-19 infection/enteritis -This is contributing to the patient's nausea, vomiting, and diarrhea -Overall emesis and diarrhea improved -Continues to be nauseous with decreased oral intake -stable on RA without respiratory symptoms   Pyuria -Initial UA on the day of admission showed 21-50 WBC -No culture was sent -Patient received 1 dose of ceftriaxone -plan cefdinir x 3 doses   Paroxysmal atrial fibrillation -Currently in sinus rhythm -Hold apixaban in preparation for HD catheter placement -Continue carvedilol -will need heparin IV bridge   Hyponatremia -Secondary to acute kidney injury and volume depletion -Improved with bicarbonate drip   Hypokalemia -Repleted   Chronic HFpEF -04/22/2023 echo EF 60 to 65%, normal RV function, trivial TR -Clinically compensated -continue Carvedilol   Coronary artery disease s/pDES-RCA 2022 ([place])  -No chest pain presently -Holding Plavix temporarily in preparation for possible dialysis catheter -Continue atorvastatin--increase back to 80 mg -restart imdur   Anemia of CKD -Iron saturation 18% -Ferritin 374 -ESA per nephrology   Thrombocytopenia -Secondary to acute medical condition -Patient does have history of splenomegaly -B12 -Folic acid   Essential hypertension -Continue carvedilol, Imdur -restart amlodipine   Uncontrolled diabetes mellitus type 2 -Holding Semglee secondary to poor oral intake -Continue NovoLog sliding scale -continue reduced dose semglee -04/15/2023 hemoglobin A1c 9.1   Hyperlipidemia -Continue atorvastatin   Chronic constipation  - pt reports having diabetic neuropathy and chronic problems with constipation  - pt has been on regular laxatives and has had a bowel movement   DISORDER, BIPOLAR NOS - Continue home trazodone, lamictal, sertraline        Consultants: renal Procedures performed: none  Disposition: AGAINST MEDICAL ADVICE  DISCHARGE MEDICATION: Allergies as of 06/08/2023       Reactions   Gelatin Swelling   No jello of any kind   Iodinated Contrast Media Anaphylaxis, Hives, Itching, Swelling   Methylprednisolone Sodium Succ Shortness Of Breath   Dicyclomine Hcl Anxiety, Other (See Comments)   "shaky and sick" GI upset, sick to stomach   Doxycycline Hives, Rash   rash   Glutamic Acid Itching, Swelling   Ketorolac Hives, Rash   Tolerated Toradol in the ED without side effect, hives, or complaint on 02/17/2022   Lisinopril Swelling   Angioedema (facial, lip, or tongue swelling)   Metformin Nausea And Vomiting, Other (See Comments)   Dyspepsia sick to stomach "It made me sick where I couldn't get out of the bed"   Nitroglycerin Nausea And Vomiting, Swelling   Pt states it makes her too sick   Nsaids Other (See Comments)   Stomach pain/bleeding   Other Itching, Swelling, Other (See Comments)   Solu-Medrol Mix-O-Vial   Quetiapine Other (See Comments)   Vancomycin    Itching and erythema at infusion site within a few minutes of starting infusion. No systemic evidence of Red Man Syndrome   Blueberry Flavor Nausea And Vomiting, Rash   Cephalexin Nausea And Vomiting, Nausea Only   sick to stomach   Fish Allergy Rash   Ibuprofen Nausea And Vomiting   GI upset Reports stomach upset 2/2 hx gastric polyps. Not true allergy.   Nitrofuran Derivatives Itching, Nausea And Vomiting   sick to stomach   Shellfish Allergy Itching, Swelling, Rash   Strawberry  Flavor Nausea And Vomiting, Rash   Sulfa Antibiotics Nausea And Vomiting, Other (See Comments)   "It just makes me real sick"        Medication List     TAKE these medications    acetaminophen 325 MG tablet Commonly known as: TYLENOL Take 325 mg by mouth every 4 (four) hours as needed for mild pain, headache or fever.   apixaban 5 MG Tabs tablet Commonly  known as: Eliquis Take 1 tablet (5 mg total) by mouth 2 (two) times daily.   atorvastatin 40 MG tablet Commonly known as: LIPITOR Take 40 mg by mouth every morning.   carvedilol 25 MG tablet Commonly known as: COREG Take 0.5 tablets (12.5 mg total) by mouth 2 (two) times daily with a meal. What changed: when to take this   clonazePAM 1 MG tablet Commonly known as: KLONOPIN Take 1 mg by mouth 2 (two) times daily.   clopidogrel 75 MG tablet Commonly known as: PLAVIX Take 75 mg by mouth daily.   diphenhydrAMINE 25 MG tablet Commonly known as: BENADRYL Take 25 mg by mouth at bedtime as needed for sleep.   gabapentin 300 MG capsule Commonly known as: NEURONTIN Take 300 mg by mouth 3 (three) times daily.   insulin detemir 100 UNIT/ML injection Commonly known as: LEVEMIR Inject 50 Units into the skin 2 (two) times daily.   isosorbide mononitrate 60 MG 24 hr tablet Commonly known as: IMDUR Take 1 tablet (60 mg total) by mouth every morning.   NovoLOG FlexPen 100 UNIT/ML FlexPen Generic drug: insulin aspart Inject 20 Units into the skin 4 (four) times daily. 4-5 times daily   NYQUIL HBP COLD & FLU PO Take 15 mLs by mouth at bedtime as needed (congestion, sore throat, cold).   sertraline 100 MG tablet Commonly known as: ZOLOFT Take 100 mg by mouth daily.   trazodone 300 MG tablet Commonly known as: DESYREL Take 300 mg by mouth at bedtime.        Discharge Exam: Filed Weights   06/04/23 2240 06/05/23 0605  Weight: 111.1 kg 105 kg   HEENT:  [place]/AT, No thrush, no icterus CV:  RRR, no rub, no S3, no S4 Lung:  CTA, no wheeze, no rhonchi Abd:  soft/+BS, NT Ext:  No edema, no lymphangitis, no synovitis, no rash   Condition at discharge: AGAINST MEDICAL ADVICE  The results of significant diagnostics from this hospitalization (including imaging, microbiology, ancillary and laboratory) are listed below for reference.   Imaging Studies: US RENAL  Result Date:  June 25, 2023 CLINICAL DATA:  [id number] AKI (acute kidney injury) [facility]) [id number] EXAM: RENAL / URINARY TRACT ULTRASOUND COMPLETE COMPARISON:  June 05, 2023 FINDINGS: Right Kidney: Renal measurements: 11.0 x 4.9 x 5.3 cm = volume: 149 mL. Echogenicity within normal limits. No mass or hydronephrosis visualized. Left Kidney: Renal measurements: 13.1 x 5.6 x 5.4 cm = volume: 204 mL. Echogenicity within normal limits. No mass or hydronephrosis visualized. Bladder: Appears normal for degree of bladder distention. Other: Increased hepatic echogenicity. Spleen is enlarged spanning 16.7 cm for an estimated volume of 676 ML. IMPRESSION: 1. No hydronephrosis. 2. Hepatic steatosis. 3. Splenomegaly. Electronically Signed   By: Meda Klinefelter M.D.   On: 2023-06-25 10:35   CT Renal Stone Study  Result Date: 06/05/2023 CLINICAL DATA:  Weakness with nausea and vomiting, initial encounter EXAM: CT ABDOMEN AND PELVIS WITHOUT CONTRAST TECHNIQUE: Multidetector CT imaging of the abdomen and pelvis was performed following the standard protocol without IV contrast. RADIATION  DOSE REDUCTION: This exam was performed according to the departmental dose-optimization program which includes automated exposure control, adjustment of the mA and/or kV according to patient size and/or use of iterative reconstruction technique. COMPARISON:  05/28/2023 FINDINGS: Lower chest: No acute abnormality. Hepatobiliary: No focal liver abnormality is seen. Status post cholecystectomy. No biliary dilatation. Pancreas: Unremarkable. No pancreatic ductal dilatation or surrounding inflammatory changes. Spleen: Normal in size without focal abnormality. Adrenals/Urinary Tract: Adrenal glands are within normal limits. Kidneys demonstrate small nonobstructing renal calculi bilaterally. Ureters are within normal limits. The bladder is decompressed. Stomach/Bowel: No obstructive or inflammatory changes of the colon are seen. The appendix has been surgically removed.  Small bowel and stomach are within normal limits. Vascular/Lymphatic: Aortic atherosclerosis. No enlarged abdominal or pelvic lymph nodes. Reproductive: Status post hysterectomy. No adnexal masses. Other: No abdominal wall hernia or abnormality. No abdominopelvic ascites. Musculoskeletal: No acute or significant osseous findings. Stable skin induration is noted in skin overlying the anterior left pelvis. IMPRESSION: No acute abnormality is noted. Bilateral nonobstructing renal calculi. No change from the prior exam. Electronically Signed   By: Alcide Clever M.D.   On: 06/05/2023 01:43   DG Chest Portable 1 View  Result Date: 06/05/2023 CLINICAL DATA:  Cough and weakness EXAM: PORTABLE CHEST 1 VIEW COMPARISON:  05/28/2023 FINDINGS: Cardiac shadow is within normal limits. Lungs are well aerated bilaterally. No focal infiltrate or effusion is seen. No bony abnormality is noted. IMPRESSION: No active disease. Electronically Signed   By: Alcide Clever M.D.   On: 06/05/2023 01:39   CT RENAL STONE STUDY  Result Date: 05/28/2023 CLINICAL DATA:  Right lower quadrant abdominal pain. EXAM: CT ABDOMEN AND PELVIS WITHOUT CONTRAST TECHNIQUE: Multidetector CT imaging of the abdomen and pelvis was performed following the standard protocol without IV contrast. RADIATION DOSE REDUCTION: This exam was performed according to the departmental dose-optimization program which includes automated exposure control, adjustment of the mA and/or kV according to patient size and/or use of iterative reconstruction technique. COMPARISON:  CT abdomen pelvis dated 04/22/2023. FINDINGS: Evaluation of this exam is limited in the absence of intravenous contrast. Lower chest: The visualized lung bases are clear. Three-vessel coronary vascular calcification. No intra-abdominal free air or free fluid. Hepatobiliary: The liver is unremarkable. No biliary ductal dilatation. Cholecystectomy. No retained calcified stone noted in the central CBD.  Pancreas: Unremarkable. No pancreatic ductal dilatation or surrounding inflammatory changes. Spleen: Normal in size without focal abnormality. Adrenals/Urinary Tract: The adrenal glands unremarkable. Vascular calcifications versus faint punctate nonobstructing bilateral renal calculi. No hydronephrosis or obstructing stone. The visualized ureters and urinary bladder appear unremarkable. Stomach/Bowel: There is moderate stool throughout the colon. There is no bowel obstruction or active inflammation. Appendectomy. Vascular/Lymphatic: Moderate aortoiliac atherosclerotic disease. The IVC is unremarkable. No portal venous gas. There is no adenopathy. Reproductive: Hysterectomy.  No adnexal masses. Other: None Musculoskeletal: Osteopenia with degenerative changes of the spine. No acute osseous pathology. Induration of the skin and subcutaneous soft tissues of the left anterior pelvic wall, likely related to subcutaneous injection or traumatic contusion. Clinical correlation is recommended. No fluid collection or hematoma. IMPRESSION: 1. No acute intra-abdominal or pelvic pathology. No hydronephrosis or obstructing stone. 2. No bowel obstruction. 3.  Aortic Atherosclerosis (ICD10-I70.0). Electronically Signed   By: Elgie Collard M.D.   On: 05/28/2023 22:23   DG Chest 2 View  Result Date: 05/28/2023 CLINICAL DATA:  Chest pain. EXAM: CHEST - 2 VIEW COMPARISON:  April 21, 2023. FINDINGS: The heart size and mediastinal contours are  within normal limits. Both lungs are clear. The visualized skeletal structures are unremarkable. IMPRESSION: No active cardiopulmonary disease. Electronically Signed   By: Lupita Raider M.D.   On: 05/28/2023 20:36    Microbiology: Results for orders placed or performed during the hospital encounter of 06/04/23  Resp panel by RT-PCR (RSV, Flu A&B, Covid) Anterior Nasal Swab     Status: Abnormal   Collection Time: 06/04/23 10:40 PM   Specimen: Anterior Nasal Swab  Result Value Ref  Range Status   SARS Coronavirus 2 by RT PCR POSITIVE (A) NEGATIVE Final    Comment: (NOTE) SARS-CoV-2 target nucleic acids are DETECTED.  The SARS-CoV-2 RNA is generally detectable in upper respiratory specimens during the acute phase of infection. Positive results are indicative of the presence of the identified virus, but do not rule out bacterial infection or co-infection with other pathogens not detected by the test. Clinical correlation with patient history and other diagnostic information is necessary to determine patient infection status. The expected result is Negative.  Fact Sheet for Patients: BloggerCourse.com  Fact Sheet for Healthcare Providers: SeriousBroker.it  This test is not yet approved or cleared by the [place] FDA and  has been authorized for detection and/or diagnosis of SARS-CoV-2 by FDA under an Emergency Use Authorization (EUA).  This EUA will remain in effect (meaning this test can be used) for the duration of  the COVID-19 declaration under Section 564(b)(1) of the A ct, 21 U.S.C. section 360bbb-3(b)(1), unless the authorization is terminated or revoked sooner.     Influenza A by PCR NEGATIVE NEGATIVE Final   Influenza B by PCR NEGATIVE NEGATIVE Final    Comment: (NOTE) The Xpert Xpress SARS-CoV-2/FLU/RSV plus assay is intended as an aid in the diagnosis of influenza from Nasopharyngeal swab specimens and should not be used as a sole basis for treatment. Nasal washings and aspirates are unacceptable for Xpert Xpress SARS-CoV-2/FLU/RSV testing.  Fact Sheet for Patients: BloggerCourse.com  Fact Sheet for Healthcare Providers: SeriousBroker.it  This test is not yet approved or cleared by the [place] FDA and has been authorized for detection and/or diagnosis of SARS-CoV-2 by FDA under an Emergency Use Authorization (EUA). This EUA will  remain in effect (meaning this test can be used) for the duration of the COVID-19 declaration under Section 564(b)(1) of the Act, 21 U.S.C. section 360bbb-3(b)(1), unless the authorization is terminated or revoked.     Resp Syncytial Virus by PCR NEGATIVE NEGATIVE Final    Comment: (NOTE) Fact Sheet for Patients: BloggerCourse.com  Fact Sheet for Healthcare Providers: SeriousBroker.it  This test is not yet approved or cleared by the [place] FDA and has been authorized for detection and/or diagnosis of SARS-CoV-2 by FDA under an Emergency Use Authorization (EUA). This EUA will remain in effect (meaning this test can be used) for the duration of the COVID-19 declaration under Section 564(b)(1) of the Act, 21 U.S.C. section 360bbb-3(b)(1), unless the authorization is terminated or revoked.  Performed at [facility], [address], [place], [place] [address]     Labs: CBC: Recent Labs  Lab 06/04/23 2354 06/06/23 0450 06/07/23 0432 06/08/23 0505  WBC 5.8 3.7* 3.5* 3.4*  NEUTROABS 3.8  --   --   --   HGB 9.6* 8.3* 8.2* 7.5*  HCT 28.2* 24.8* 25.1* 22.2*  MCV 87.6 88.3 89.0 88.1  PLT 147* 121* 130* 118*   Basic Metabolic Panel: Recent Labs  Lab 06/04/23 2354 06/05/23 0928 06/05/23 1229 06/06/23 0450 06/07/23 0432 06/08/23  0505  NA 133*  --  134* 135 137 136  K 3.2*  --  3.6 3.4* 3.6 3.6  CL 100  --  103 105 109 104  CO2 19*  --  19* 17* 15* 19*  GLUCOSE 139*  --  105* 104* 117* 162*  BUN 49*  --  48* 49* 54* 58*  CREATININE 5.96*  --  6.54* 7.75* 9.00* 9.74*  CALCIUM 7.9*  --  7.3* 7.1* 7.1* 7.1*  MG  --  1.8 1.7 1.7 1.9 1.7  PHOS  --  7.7*  --  7.8* 6.8* 5.3*   Liver Function Tests: Recent Labs  Lab 06/04/23 2354  AST 14*  ALT 23  ALKPHOS 50  BILITOT 0.5  PROT 8.7*  ALBUMIN 3.4*   CBG: Recent Labs  Lab 06/07/23 1142 06/07/23 1640 06/07/23 2115 06/08/23 0758 06/08/23 1122  GLUCAP 162* 153*  161* 138* 127*    Discharge time spent: greater than 30 minutes.  Signed: Catarina Hartshorn, MD [facility] 06/08/2023

## 2023-06-08 NOTE — Progress Notes (Signed)
PROGRESS NOTE  Yuni Schwass NGE:[id number] DOB: 02/08/1961 DOA: 06/04/2023 PCP: Patient, No Pcp Per  Brief History:  62 year old female with a history of CAD s/pDES-RCA 2022 ([place])  , hypertension, hyperlipidemia, T2DM, DVT on Eliquis, paroxysmal atrial fibrillation, bipolar disorder presented on 06/05/2023 with generalized weakness associated with nausea, vomiting, diarrhea.  She had decreased oral intake and some dizziness.  She states that she had not been compliant with her insulins secondary to feeling sick.  In the ED, the patient was afebrile and hemodynamically stable with oxygen saturation 95-98% on room air.  Initial BMP showed sodium 133, potassium 3.2, bicarbonate 19, serum creatinine 5.6. The patient was started on IV fluids.  Unfortunately, her renal function continued to worsen. Nephrology was consulted to assist with management. The patient was started on a bicarbonate drip. The patient's diarrhea ultimately improved.  She remained stable on room air without any shortness of breath.  The patient had continued nausea with decreased oral intake during the hospitalization.   The patient was recently hospitalized from 04/15/2023 to 05/03/2023.  During that hospitalization, the patient had unstable angina.  She was treated with IV heparin for over 7 days.  She was subsequent transferred to [facility] for heart catheterization.  She underwent right heart catheterization on 04/25/2023 which showed pulmonary hypertension and volume overload.  Her cardiac index was 3.2, PA pressure 50/21, RV pressure 48/11.  Given evidence of fluid overload with acute on chronic HFpEF the patient was started on intravenous furosemide and diuresed.  Left heart catheterization was deferred secondary to the patient's acute on chronic kidney disease.  She was continued on aspirin to 1 mg, Lipitor 80 mg, carvedilol 12.5 mg twice daily, Plavix on 5 mg daily, Imdur 90 mg daily.  Aspirin was ultimately stopped as  the patient was continued on anticoagulation (apixaban) for atrial fibrillation.  She was continued on amlodipine 5 mg daily for her hypertension in addition to her other cardiac medications. Regarding her renal function, her previous renal baseline was 1.1-1.4.  Serum creatinine progressed to 2.5-2.75.  The patient's serum creatinine ultimately stabilized around 2.3-2.6.  Assessment/Plan: Acute on chronic renal failure--CKD stage IV -Baseline creatinine 2.3-2.6 -Appreciate nephrology follow-up -Serum creatinine continues to gradually rise -Continue bicarbonate drip -Strict I's and O's  COVID-19 infection/enteritis -This is contributing to the patient's nausea, vomiting, and diarrhea -Overall emesis and diarrhea improved -Continues to be nauseous with decreased oral intake -stable on RA without respiratory symptoms  Pyuria -Initial UA on the day of admission showed 21-50 WBC -No culture was sent -Patient received 1 dose of ceftriaxone -plan cefdinir x 3 doses  Paroxysmal atrial fibrillation -Currently in sinus rhythm -Hold apixaban in preparation for HD catheter placement -Continue carvedilol -will need heparin IV bridge  Hyponatremia -Secondary to acute kidney injury and volume depletion -Improved with bicarbonate drip  Hypokalemia -Repleted  Chronic HFpEF -04/22/2023 echo EF 60 to 65%, normal RV function, trivial TR -Clinically compensated -continue Carvedilol  Coronary artery disease s/pDES-RCA 2022 ([place])  -No chest pain presently -Holding Plavix temporarily in preparation for possible dialysis catheter -Continue atorvastatin--increase back to 80 mg -restart imdur  Anemia of CKD -Iron saturation 18% -Ferritin 374 -ESA per nephrology  Thrombocytopenia -Secondary to acute medical condition -Patient does have history of splenomegaly -B12 -Folic acid  Essential hypertension -Continue carvedilol, Imdur -restart amlodipine  Uncontrolled diabetes mellitus type  2 -Holding Semglee secondary to poor oral intake -Continue NovoLog sliding scale -continue reduced  dose semglee -04/15/2023 hemoglobin A1c 9.1  Hyperlipidemia -Continue atorvastatin  Chronic constipation  - pt reports having diabetic neuropathy and chronic problems with constipation  - pt has been on regular laxatives and has had a bowel movement  DISORDER, BIPOLAR NOS - Continue home trazodone       Family Communication:  no  Family at bedside  Consultants:  renal  Code Status:  FULL   DVT Prophylaxis:  apixaban   Procedures: As Listed in Progress Note Above  Antibiotics: Cefdinir 9/22>>     Subjective: Patient is feeling nauseous.  She had 1 episode of emesis last night.  She states the diarrhea is improved.  She denies any fevers, chills, headache, chest pain, shortness breath, hemoptysis, hematochezia, melena.  She has some dry cough.  She feels weak.  Objective: Vitals:   06/07/23 0439 06/07/23 1440 06/07/23 2110 06/08/23 0533  BP: (!) 157/71 (!) 170/72 (!) 161/64 (!) 160/73  Pulse: 65 72 73 79  Resp: 20 19 20 20   Temp: 98.3 F (36.8 C) 97.8 F (36.6 C) 98 F (36.7 C) 98.2 F (36.8 C)  TempSrc: Oral Oral Oral Oral  SpO2: 100% 98% 98% 94%  Weight:      Height:        Intake/Output Summary (Last 24 hours) at 06/08/2023 0622 Last data filed at 06/07/2023 1821 Gross per 24 hour  Intake 929.07 ml  Output 450 ml  Net 479.07 ml   Weight change:  Exam:  General:  Pt is alert, follows commands appropriately, not in acute distress HEENT: No icterus, No thrush, No neck mass, [place]/AT Cardiovascular: RRR, S1/S2, no rubs, no gallops Respiratory: CTA bilaterally, no wheezing, no crackles, no rhonchi Abdomen: Soft/+BS, non tender, non distended, no guarding Extremities: No edema, No lymphangitis, No petechiae, No rashes, no synovitis   Data Reviewed: I have personally reviewed following labs and imaging studies Basic Metabolic Panel: Recent Labs  Lab  06/04/23 2354 06/05/23 0928 06/05/23 1229 06/06/23 0450 06/07/23 0432  NA 133*  --  134* 135 137  K 3.2*  --  3.6 3.4* 3.6  CL 100  --  103 105 109  CO2 19*  --  19* 17* 15*  GLUCOSE 139*  --  105* 104* 117*  BUN 49*  --  48* 49* 54*  CREATININE 5.96*  --  6.54* 7.75* 9.00*  CALCIUM 7.9*  --  7.3* 7.1* 7.1*  MG  --  1.8 1.7 1.7 1.9  PHOS  --  7.7*  --  7.8* 6.8*   Liver Function Tests: Recent Labs  Lab 06/04/23 2354  AST 14*  ALT 23  ALKPHOS 50  BILITOT 0.5  PROT 8.7*  ALBUMIN 3.4*   No results for input(s): "LIPASE", "AMYLASE" in the last 168 hours. No results for input(s): "AMMONIA" in the last 168 hours. Coagulation Profile: No results for input(s): "INR", "PROTIME" in the last 168 hours. CBC: Recent Labs  Lab 06/04/23 2354 06/06/23 0450 06/07/23 0432 06/08/23 0505  WBC 5.8 3.7* 3.5* 3.4*  NEUTROABS 3.8  --   --   --   HGB 9.6* 8.3* 8.2* 7.5*  HCT 28.2* 24.8* 25.1* 22.2*  MCV 87.6 88.3 89.0 88.1  PLT 147* 121* 130* 118*   Cardiac Enzymes: No results for input(s): "CKTOTAL", "CKMB", "CKMBINDEX", "TROPONINI" in the last 168 hours. BNP: Invalid input(s): "POCBNP" CBG: Recent Labs  Lab 06/06/23 2034 06/07/23 0733 06/07/23 1142 06/07/23 1640 06/07/23 2115  GLUCAP 126* 125* 162* 153* 161*   HbA1C:  No results for input(s): "HGBA1C" in the last 72 hours. Urine analysis:    Component Value Date/Time   COLORURINE STRAW (A) 06/07/2023 1439   APPEARANCEUR CLEAR 06/07/2023 1439   LABSPEC 1.005 06/07/2023 1439   PHURINE 6.0 06/07/2023 1439   GLUCOSEU NEGATIVE 06/07/2023 1439   HGBUR SMALL (A) 06/07/2023 1439   BILIRUBINUR NEGATIVE 06/07/2023 1439   KETONESUR NEGATIVE 06/07/2023 1439   PROTEINUR 30 (A) 06/07/2023 1439   UROBILINOGEN 0.2 12/17/2011 2258   NITRITE NEGATIVE 06/07/2023 1439   LEUKOCYTESUR NEGATIVE 06/07/2023 1439   Sepsis Labs: @LABRCNTIP (procalcitonin:4,lacticidven:4) ) Recent Results (from the past 240 hour(s))  Resp panel by RT-PCR  (RSV, Flu A&B, Covid) Anterior Nasal Swab     Status: Abnormal   Collection Time: 06/04/23 10:40 PM   Specimen: Anterior Nasal Swab  Result Value Ref Range Status   SARS Coronavirus 2 by RT PCR POSITIVE (A) NEGATIVE Final    Comment: (NOTE) SARS-CoV-2 target nucleic acids are DETECTED.  The SARS-CoV-2 RNA is generally detectable in upper respiratory specimens during the acute phase of infection. Positive results are indicative of the presence of the identified virus, but do not rule out bacterial infection or co-infection with other pathogens not detected by the test. Clinical correlation with patient history and other diagnostic information is necessary to determine patient infection status. The expected result is Negative.  Fact Sheet for Patients: BloggerCourse.com  Fact Sheet for Healthcare Providers: SeriousBroker.it  This test is not yet approved or cleared by the [place] FDA and  has been authorized for detection and/or diagnosis of SARS-CoV-2 by FDA under an Emergency Use Authorization (EUA).  This EUA will remain in effect (meaning this test can be used) for the duration of  the COVID-19 declaration under Section 564(b)(1) of the A ct, 21 U.S.C. section 360bbb-3(b)(1), unless the authorization is terminated or revoked sooner.     Influenza A by PCR NEGATIVE NEGATIVE Final   Influenza B by PCR NEGATIVE NEGATIVE Final    Comment: (NOTE) The Xpert Xpress SARS-CoV-2/FLU/RSV plus assay is intended as an aid in the diagnosis of influenza from Nasopharyngeal swab specimens and should not be used as a sole basis for treatment. Nasal washings and aspirates are unacceptable for Xpert Xpress SARS-CoV-2/FLU/RSV testing.  Fact Sheet for Patients: BloggerCourse.com  Fact Sheet for Healthcare Providers: SeriousBroker.it  This test is not yet approved or cleared by the [place] FDA and has been authorized for detection and/or diagnosis of SARS-CoV-2 by FDA under an Emergency Use Authorization (EUA). This EUA will remain in effect (meaning this test can be used) for the duration of the COVID-19 declaration under Section 564(b)(1) of the Act, 21 U.S.C. section 360bbb-3(b)(1), unless the authorization is terminated or revoked.     Resp Syncytial Virus by PCR NEGATIVE NEGATIVE Final    Comment: (NOTE) Fact Sheet for Patients: BloggerCourse.com  Fact Sheet for Healthcare Providers: SeriousBroker.it  This test is not yet approved or cleared by the [place] FDA and has been authorized for detection and/or diagnosis of SARS-CoV-2 by FDA under an Emergency Use Authorization (EUA). This EUA will remain in effect (meaning this test can be used) for the duration of the COVID-19 declaration under Section 564(b)(1) of the Act, 21 U.S.C. section 360bbb-3(b)(1), unless the authorization is terminated or revoked.  Performed at [facility], [address], [place], [place] [address]      Scheduled Meds:  apixaban  5 mg Oral BID   atorvastatin  40 mg Oral q morning  carvedilol  12.5 mg Oral BID WC   ciprofloxacin  500 mg Oral Daily   feeding supplement (GLUCERNA SHAKE)  237 mL Oral TID BM   insulin aspart  0-15 Units Subcutaneous TID WC   insulin aspart  0-5 Units Subcutaneous QHS   insulin glargine-yfgn  10 Units Subcutaneous QHS   sevelamer carbonate  1,600 mg Oral TID WC   Continuous Infusions:  sodium bicarbonate 150 mEq in dextrose 5 % 1,150 mL infusion 125 mL/hr at 06/07/23 1710    Procedures/Studies: US RENAL  Result Date: 06/06/2023 CLINICAL DATA:  [id number] AKI (acute kidney injury) (HCC) [id number] EXAM: RENAL / URINARY TRACT ULTRASOUND COMPLETE COMPARISON:  June 05, 2023 FINDINGS: Right Kidney: Renal measurements: 11.0 x 4.9 x 5.3 cm = volume: 149 mL. Echogenicity within normal limits. No  mass or hydronephrosis visualized. Left Kidney: Renal measurements: 13.1 x 5.6 x 5.4 cm = volume: 204 mL. Echogenicity within normal limits. No mass or hydronephrosis visualized. Bladder: Appears normal for degree of bladder distention. Other: Increased hepatic echogenicity. Spleen is enlarged spanning 16.7 cm for an estimated volume of 676 ML. IMPRESSION: 1. No hydronephrosis. 2. Hepatic steatosis. 3. Splenomegaly. Electronically Signed   By: Meda Klinefelter M.D.   On: 06/06/2023 10:35   CT Renal Stone Study  Result Date: 06/05/2023 CLINICAL DATA:  Weakness with nausea and vomiting, initial encounter EXAM: CT ABDOMEN AND PELVIS WITHOUT CONTRAST TECHNIQUE: Multidetector CT imaging of the abdomen and pelvis was performed following the standard protocol without IV contrast. RADIATION DOSE REDUCTION: This exam was performed according to the departmental dose-optimization program which includes automated exposure control, adjustment of the mA and/or kV according to patient size and/or use of iterative reconstruction technique. COMPARISON:  05/28/2023 FINDINGS: Lower chest: No acute abnormality. Hepatobiliary: No focal liver abnormality is seen. Status post cholecystectomy. No biliary dilatation. Pancreas: Unremarkable. No pancreatic ductal dilatation or surrounding inflammatory changes. Spleen: Normal in size without focal abnormality. Adrenals/Urinary Tract: Adrenal glands are within normal limits. Kidneys demonstrate small nonobstructing renal calculi bilaterally. Ureters are within normal limits. The bladder is decompressed. Stomach/Bowel: No obstructive or inflammatory changes of the colon are seen. The appendix has been surgically removed. Small bowel and stomach are within normal limits. Vascular/Lymphatic: Aortic atherosclerosis. No enlarged abdominal or pelvic lymph nodes. Reproductive: Status post hysterectomy. No adnexal masses. Other: No abdominal wall hernia or abnormality. No abdominopelvic ascites.  Musculoskeletal: No acute or significant osseous findings. Stable skin induration is noted in skin overlying the anterior left pelvis. IMPRESSION: No acute abnormality is noted. Bilateral nonobstructing renal calculi. No change from the prior exam. Electronically Signed   By: Alcide Clever M.D.   On: 06/05/2023 01:43   DG Chest Portable 1 View  Result Date: 06/05/2023 CLINICAL DATA:  Cough and weakness EXAM: PORTABLE CHEST 1 VIEW COMPARISON:  05/28/2023 FINDINGS: Cardiac shadow is within normal limits. Lungs are well aerated bilaterally. No focal infiltrate or effusion is seen. No bony abnormality is noted. IMPRESSION: No active disease. Electronically Signed   By: Alcide Clever M.D.   On: 06/05/2023 01:39   CT RENAL STONE STUDY  Result Date: 05/28/2023 CLINICAL DATA:  Right lower quadrant abdominal pain. EXAM: CT ABDOMEN AND PELVIS WITHOUT CONTRAST TECHNIQUE: Multidetector CT imaging of the abdomen and pelvis was performed following the standard protocol without IV contrast. RADIATION DOSE REDUCTION: This exam was performed according to the departmental dose-optimization program which includes automated exposure control, adjustment of the mA and/or kV according to patient size and/or use of iterative  reconstruction technique. COMPARISON:  CT abdomen pelvis dated 04/22/2023. FINDINGS: Evaluation of this exam is limited in the absence of intravenous contrast. Lower chest: The visualized lung bases are clear. Three-vessel coronary vascular calcification. No intra-abdominal free air or free fluid. Hepatobiliary: The liver is unremarkable. No biliary ductal dilatation. Cholecystectomy. No retained calcified stone noted in the central CBD. Pancreas: Unremarkable. No pancreatic ductal dilatation or surrounding inflammatory changes. Spleen: Normal in size without focal abnormality. Adrenals/Urinary Tract: The adrenal glands unremarkable. Vascular calcifications versus faint punctate nonobstructing bilateral renal  calculi. No hydronephrosis or obstructing stone. The visualized ureters and urinary bladder appear unremarkable. Stomach/Bowel: There is moderate stool throughout the colon. There is no bowel obstruction or active inflammation. Appendectomy. Vascular/Lymphatic: Moderate aortoiliac atherosclerotic disease. The IVC is unremarkable. No portal venous gas. There is no adenopathy. Reproductive: Hysterectomy.  No adnexal masses. Other: None Musculoskeletal: Osteopenia with degenerative changes of the spine. No acute osseous pathology. Induration of the skin and subcutaneous soft tissues of the left anterior pelvic wall, likely related to subcutaneous injection or traumatic contusion. Clinical correlation is recommended. No fluid collection or hematoma. IMPRESSION: 1. No acute intra-abdominal or pelvic pathology. No hydronephrosis or obstructing stone. 2. No bowel obstruction. 3.  Aortic Atherosclerosis (ICD10-I70.0). Electronically Signed   By: Elgie Collard M.D.   On: 05/28/2023 22:23   DG Chest 2 View  Result Date: 05/28/2023 CLINICAL DATA:  Chest pain. EXAM: CHEST - 2 VIEW COMPARISON:  April 21, 2023. FINDINGS: The heart size and mediastinal contours are within normal limits. Both lungs are clear. The visualized skeletal structures are unremarkable. IMPRESSION: No active cardiopulmonary disease. Electronically Signed   By: Lupita Raider M.D.   On: 05/28/2023 20:36    Catarina Hartshorn, DO  [facility]  If 7PM-7AM, please contact night-coverage [email or web address] Password [email or web address] 06/08/2023, 6:22 AM   LOS: 3 days

## 2023-06-08 NOTE — Plan of Care (Signed)
  Problem: Education: Goal: Understanding of CV disease, CV risk reduction, and recovery process will improve Outcome: Progressing Goal: Individualized Educational Video(s) Outcome: Progressing   Problem: Activity: Goal: Ability to return to baseline activity level will improve Outcome: Progressing   Problem: Cardiovascular: Goal: Ability to achieve and maintain adequate cardiovascular perfusion will improve Outcome: Progressing Goal: Vascular access site(s) Level 0-1 will be maintained Outcome: Progressing   Problem: Health Behavior/Discharge Planning: Goal: Ability to safely manage health-related needs after discharge will improve Outcome: Progressing   Problem: Education: Goal: Knowledge of General Education information will improve Description: Including pain rating scale, medication(s)/side effects and non-pharmacologic comfort measures Outcome: Progressing   Problem: Health Behavior/Discharge Planning: Goal: Ability to manage health-related needs will improve Outcome: Progressing   Problem: Clinical Measurements: Goal: Ability to maintain clinical measurements within normal limits will improve Outcome: Progressing Goal: Will remain free from infection Outcome: Progressing Goal: Diagnostic test results will improve Outcome: Progressing Goal: Respiratory complications will improve Outcome: Progressing Goal: Cardiovascular complication will be avoided Outcome: Progressing   Problem: Activity: Goal: Risk for activity intolerance will decrease Outcome: Progressing   Problem: Nutrition: Goal: Adequate nutrition will be maintained Outcome: Progressing   Problem: Coping: Goal: Level of anxiety will decrease Outcome: Progressing   Problem: Elimination: Goal: Will not experience complications related to bowel motility Outcome: Progressing Goal: Will not experience complications related to urinary retention Outcome: Progressing   Problem: Pain Managment: Goal:  General experience of comfort will improve Outcome: Progressing   Problem: Safety: Goal: Ability to remain free from injury will improve Outcome: Progressing   Problem: Skin Integrity: Goal: Risk for impaired skin integrity will decrease Outcome: Progressing   Problem: Education: Goal: Ability to describe self-care measures that may prevent or decrease complications (Diabetes Survival Skills Education) will improve Outcome: Progressing Goal: Individualized Educational Video(s) Outcome: Progressing   Problem: Coping: Goal: Ability to adjust to condition or change in health will improve Outcome: Progressing   Problem: Fluid Volume: Goal: Ability to maintain a balanced intake and output will improve Outcome: Progressing   Problem: Health Behavior/Discharge Planning: Goal: Ability to identify and utilize available resources and services will improve Outcome: Progressing Goal: Ability to manage health-related needs will improve Outcome: Progressing   Problem: Metabolic: Goal: Ability to maintain appropriate glucose levels will improve Outcome: Progressing   Problem: Nutritional: Goal: Maintenance of adequate nutrition will improve Outcome: Progressing Goal: Progress toward achieving an optimal weight will improve Outcome: Progressing   Problem: Skin Integrity: Goal: Risk for impaired skin integrity will decrease Outcome: Progressing   Problem: Tissue Perfusion: Goal: Adequacy of tissue perfusion will improve Outcome: Progressing   Problem: Education: Goal: Knowledge of risk factors and measures for prevention of condition will improve Outcome: Progressing   Problem: Coping: Goal: Psychosocial and spiritual needs will be supported Outcome: Progressing   Problem: Respiratory: Goal: Will maintain a patent airway Outcome: Progressing Goal: Complications related to the disease process, condition or treatment will be avoided or minimized Outcome: Progressing

## 2023-06-08 NOTE — Plan of Care (Signed)
progressing 

## 2023-06-09 LAB — PARATHYROID HORMONE, INTACT (NO CA): PTH: 138 pg/mL — ABNORMAL HIGH (ref 15–65)

## 2023-11-15 DIAGNOSIS — K922 Gastrointestinal hemorrhage, unspecified: Secondary | ICD-10-CM

## 2023-11-15 HISTORY — DX: Gastrointestinal hemorrhage, unspecified: K92.2

## 2024-05-05 ENCOUNTER — Emergency Department (HOSPITAL_COMMUNITY): Payer: Self-pay

## 2024-05-05 ENCOUNTER — Inpatient Hospital Stay (HOSPITAL_COMMUNITY)
Admission: EM | Admit: 2024-05-05 | Discharge: 2024-05-08 | DRG: 683 | Disposition: A | Discharge status: Home / Self Care | Payer: Self-pay | Attending: Internal Medicine | Admitting: Internal Medicine

## 2024-05-05 ENCOUNTER — Other Ambulatory Visit: Payer: Self-pay

## 2024-05-05 ENCOUNTER — Encounter (HOSPITAL_COMMUNITY): Payer: Self-pay | Admitting: Emergency Medicine

## 2024-05-05 DIAGNOSIS — R079 Chest pain, unspecified: Principal | ICD-10-CM | POA: Diagnosis present

## 2024-05-05 DIAGNOSIS — Z794 Long term (current) use of insulin: Secondary | ICD-10-CM | POA: Diagnosis not present

## 2024-05-05 DIAGNOSIS — E782 Mixed hyperlipidemia: Secondary | ICD-10-CM | POA: Diagnosis present

## 2024-05-05 DIAGNOSIS — E1122 Type 2 diabetes mellitus with diabetic chronic kidney disease: Secondary | ICD-10-CM | POA: Diagnosis present

## 2024-05-05 DIAGNOSIS — Z79899 Other long term (current) drug therapy: Secondary | ICD-10-CM | POA: Diagnosis not present

## 2024-05-05 DIAGNOSIS — Z91041 Radiographic dye allergy status: Secondary | ICD-10-CM

## 2024-05-05 DIAGNOSIS — I252 Old myocardial infarction: Secondary | ICD-10-CM

## 2024-05-05 DIAGNOSIS — K581 Irritable bowel syndrome with constipation: Secondary | ICD-10-CM | POA: Diagnosis present

## 2024-05-05 DIAGNOSIS — Z9102 Food additives allergy status: Secondary | ICD-10-CM

## 2024-05-05 DIAGNOSIS — Z882 Allergy status to sulfonamides status: Secondary | ICD-10-CM

## 2024-05-05 DIAGNOSIS — Z7901 Long term (current) use of anticoagulants: Secondary | ICD-10-CM

## 2024-05-05 DIAGNOSIS — K219 Gastro-esophageal reflux disease without esophagitis: Secondary | ICD-10-CM | POA: Diagnosis present

## 2024-05-05 DIAGNOSIS — E1143 Type 2 diabetes mellitus with diabetic autonomic (poly)neuropathy: Secondary | ICD-10-CM | POA: Diagnosis present

## 2024-05-05 DIAGNOSIS — E861 Hypovolemia: Secondary | ICD-10-CM | POA: Diagnosis present

## 2024-05-05 DIAGNOSIS — I5032 Chronic diastolic (congestive) heart failure: Secondary | ICD-10-CM | POA: Diagnosis present

## 2024-05-05 DIAGNOSIS — Z9049 Acquired absence of other specified parts of digestive tract: Secondary | ICD-10-CM

## 2024-05-05 DIAGNOSIS — N184 Chronic kidney disease, stage 4 (severe): Secondary | ICD-10-CM | POA: Diagnosis present

## 2024-05-05 DIAGNOSIS — I10 Essential (primary) hypertension: Secondary | ICD-10-CM | POA: Diagnosis not present

## 2024-05-05 DIAGNOSIS — N179 Acute kidney failure, unspecified: Secondary | ICD-10-CM | POA: Diagnosis present

## 2024-05-05 DIAGNOSIS — Z7951 Long term (current) use of inhaled steroids: Secondary | ICD-10-CM | POA: Diagnosis not present

## 2024-05-05 DIAGNOSIS — K3184 Gastroparesis: Secondary | ICD-10-CM | POA: Diagnosis present

## 2024-05-05 DIAGNOSIS — R55 Syncope and collapse: Secondary | ICD-10-CM | POA: Insufficient documentation

## 2024-05-05 DIAGNOSIS — N189 Chronic kidney disease, unspecified: Secondary | ICD-10-CM | POA: Diagnosis not present

## 2024-05-05 DIAGNOSIS — Z881 Allergy status to other antibiotic agents status: Secondary | ICD-10-CM

## 2024-05-05 DIAGNOSIS — F319 Bipolar disorder, unspecified: Secondary | ICD-10-CM | POA: Diagnosis present

## 2024-05-05 DIAGNOSIS — I2510 Atherosclerotic heart disease of native coronary artery without angina pectoris: Secondary | ICD-10-CM | POA: Diagnosis present

## 2024-05-05 DIAGNOSIS — Z86718 Personal history of other venous thrombosis and embolism: Secondary | ICD-10-CM | POA: Diagnosis not present

## 2024-05-05 DIAGNOSIS — J45909 Unspecified asthma, uncomplicated: Secondary | ICD-10-CM | POA: Diagnosis present

## 2024-05-05 DIAGNOSIS — Z883 Allergy status to other anti-infective agents status: Secondary | ICD-10-CM

## 2024-05-05 DIAGNOSIS — D631 Anemia in chronic kidney disease: Secondary | ICD-10-CM | POA: Diagnosis present

## 2024-05-05 DIAGNOSIS — I480 Paroxysmal atrial fibrillation: Secondary | ICD-10-CM | POA: Diagnosis present

## 2024-05-05 DIAGNOSIS — Z955 Presence of coronary angioplasty implant and graft: Secondary | ICD-10-CM

## 2024-05-05 DIAGNOSIS — Z91013 Allergy to seafood: Secondary | ICD-10-CM

## 2024-05-05 DIAGNOSIS — Z7902 Long term (current) use of antithrombotics/antiplatelets: Secondary | ICD-10-CM

## 2024-05-05 DIAGNOSIS — R42 Dizziness and giddiness: Secondary | ICD-10-CM | POA: Diagnosis not present

## 2024-05-05 DIAGNOSIS — E785 Hyperlipidemia, unspecified: Secondary | ICD-10-CM | POA: Diagnosis not present

## 2024-05-05 DIAGNOSIS — Z888 Allergy status to other drugs, medicaments and biological substances status: Secondary | ICD-10-CM

## 2024-05-05 DIAGNOSIS — E1165 Type 2 diabetes mellitus with hyperglycemia: Secondary | ICD-10-CM | POA: Diagnosis present

## 2024-05-05 DIAGNOSIS — I130 Hypertensive heart and chronic kidney disease with heart failure and stage 1 through stage 4 chronic kidney disease, or unspecified chronic kidney disease: Secondary | ICD-10-CM | POA: Diagnosis present

## 2024-05-05 DIAGNOSIS — Z90710 Acquired absence of both cervix and uterus: Secondary | ICD-10-CM

## 2024-05-05 DIAGNOSIS — Z90721 Acquired absence of ovaries, unilateral: Secondary | ICD-10-CM

## 2024-05-05 DIAGNOSIS — I25119 Atherosclerotic heart disease of native coronary artery with unspecified angina pectoris: Secondary | ICD-10-CM

## 2024-05-05 DIAGNOSIS — I6523 Occlusion and stenosis of bilateral carotid arteries: Secondary | ICD-10-CM | POA: Diagnosis present

## 2024-05-05 DIAGNOSIS — Z886 Allergy status to analgesic agent status: Secondary | ICD-10-CM

## 2024-05-05 DIAGNOSIS — R0789 Other chest pain: Secondary | ICD-10-CM | POA: Diagnosis not present

## 2024-05-05 DIAGNOSIS — Z91018 Allergy to other foods: Secondary | ICD-10-CM

## 2024-05-05 DIAGNOSIS — N185 Chronic kidney disease, stage 5: Secondary | ICD-10-CM | POA: Diagnosis present

## 2024-05-05 LAB — CBC
HCT: 27.3 % — ABNORMAL LOW (ref 36.0–46.0)
Hemoglobin: 8.9 g/dL — ABNORMAL LOW (ref 12.0–15.0)
MCH: 30.1 pg (ref 26.0–34.0)
MCHC: 32.6 g/dL (ref 30.0–36.0)
MCV: 92.2 fL (ref 80.0–100.0)
Platelets: 200 K/uL (ref 150–400)
RBC: 2.96 MIL/uL — ABNORMAL LOW (ref 3.87–5.11)
RDW: 16 % — ABNORMAL HIGH (ref 11.5–15.5)
WBC: 6 K/uL (ref 4.0–10.5)
nRBC: 0 % (ref 0.0–0.2)

## 2024-05-05 LAB — HEPATIC FUNCTION PANEL
ALT: 11 U/L (ref 0–44)
AST: 11 U/L — ABNORMAL LOW (ref 15–41)
Albumin: 3.3 g/dL — ABNORMAL LOW (ref 3.5–5.0)
Alkaline Phosphatase: 50 U/L (ref 38–126)
Bilirubin, Direct: <0.1 mg/dL (ref 0.0–0.2)
Total Bilirubin: 0.7 mg/dL (ref 0.0–1.2)
Total Protein: 8.9 g/dL — ABNORMAL HIGH (ref 6.5–8.1)

## 2024-05-05 LAB — BASIC METABOLIC PANEL WITH GFR
Anion gap: 13 (ref 5–15)
BUN: 45 mg/dL — ABNORMAL HIGH (ref 8–23)
CO2: 18 mmol/L — ABNORMAL LOW (ref 22–32)
Calcium: 8.7 mg/dL — ABNORMAL LOW (ref 8.9–10.3)
Chloride: 105 mmol/L (ref 98–111)
Creatinine, Ser: 5.57 mg/dL — ABNORMAL HIGH (ref 0.44–1.00)
GFR, Estimated: 8 mL/min — ABNORMAL LOW (ref >60)
Glucose, Bld: 163 mg/dL — ABNORMAL HIGH (ref 70–99)
Potassium: 4.9 mmol/L (ref 3.5–5.1)
Sodium: 136 mmol/L (ref 135–145)

## 2024-05-05 LAB — LIPASE, BLOOD: Lipase: 44 U/L (ref 11–51)

## 2024-05-05 LAB — TROPONIN I (HIGH SENSITIVITY)
Troponin I (High Sensitivity): 4 ng/L (ref <18)
Troponin I (High Sensitivity): 4 ng/L (ref <18)

## 2024-05-05 MED ORDER — LACTATED RINGERS IV BOLUS
500.0000 mL | Freq: Once | INTRAVENOUS | Status: AC
  Administered 2024-05-05: 500 mL via INTRAVENOUS

## 2024-05-05 MED ORDER — FAMOTIDINE IN NACL 20-0.9 MG/50ML-% IV SOLN
20.0000 mg | Freq: Once | INTRAVENOUS | Status: AC
  Administered 2024-05-05: 20 mg via INTRAVENOUS
  Filled 2024-05-05: qty 50

## 2024-05-05 MED ORDER — ONDANSETRON HCL 4 MG/2ML IJ SOLN
4.0000 mg | Freq: Once | INTRAMUSCULAR | Status: AC
  Administered 2024-05-05: 4 mg via INTRAVENOUS
  Filled 2024-05-05: qty 2

## 2024-05-05 MED ORDER — MORPHINE SULFATE (PF) 4 MG/ML IV SOLN
4.0000 mg | Freq: Once | INTRAVENOUS | Status: AC
  Administered 2024-05-05: 4 mg via INTRAVENOUS
  Filled 2024-05-05: qty 1

## 2024-05-05 NOTE — H&P (Signed)
 History and Physical    Patient: [name] FMW:[id number] DOB: 03-Mar-1961 DOA: 05/05/2024 DOS: the patient was seen and examined on 05/06/2024 PCP: System, Provider Not In  Patient coming from: Home  Chief Complaint:  Chief Complaint  Patient presents with   Chest Pain   HPI: [name] is a 63 y.o. female with medical history significant of hypertension, hyperlipidemia, paroxysmal atrial fibrillation on Eliquis , T2DM, DVT, CAD s/pDES-RCA 2022 in [place], [place]  who presents to the emergency department due to nonreproducible, pressure-like chest pain and abdominal pain which started around 4 to 5 PM this afternoon while at rest.  Chest pain radiates to right shoulder and was associated with shortness of breath.  Abdominal pain was rated as 7/10 on pain scale.  She presented to the ED.  While in the waiting room, she complained of feeling lightheaded, she was sweating and felt like passing out. Patient states that she was admitted to [facility], [facility]  about a week ago due to pneumonia and that she was also transfused with 1 unit of PRBC due to history of GI bleed with unknown source (4 months or earlier, she received 2 units of PRBCs at [facility].  EGD done at that time was normal per patient).  ED Course:  In the emergency department, she was hemodynamically stable.  Workup in the ED showed CBC normal CBC except for hemoglobin of 8.9 and hematocrit of 27.3.  BMP shows sodium of 136, potassium 4.9, chloride 105, bicarb 18, blood glucose 163, BUN 45, creatinine 5.57 (creatinine was 9.74 on 06/08/2023).  Troponin x 2 was flat at 4, lipase 44, urinalysis was positive for proteinuria. CT abdomen and pelvis without contrast showed no acute findings in the abdominal pelvis.  Punctate bilateral nephrolithiasis.  No hydronephrosis. Chest x-ray showed no acute findings She was treated with IV famotidine , IV Zofran , IV morphine  and IV hydration was provided. TRH was asked admit  patient  Review of Systems: Review of systems as noted in the HPI. All other systems reviewed and are negative.   Past Medical History:  Diagnosis Date   Asthma    Atrial fibrillation (HCC)    Bipolar 1 disorder (HCC)    Chronic back pain    Chronic chest pain    Diabetes mellitus without complication (HCC)    Gastroparesis    GERD (gastroesophageal reflux disease)    Hyperlipemia    Hypertension    IBS (irritable bowel syndrome)    Normal cardiac stress test 02/2014   [facility]   Past Surgical History:  Procedure Laterality Date   ABDOMINAL HYSTERECTOMY  2001   APPENDECTOMY  10/2005   CHOLECYSTECTOMY  10/2005   COLONOSCOPY  2007   Patel ([name])-pt reports hemorrhoids   ESOPHAGOGASTRODUODENOSCOPY  02/28/2006   [name], normal on daily PPI, myultiple hyperplastic polyps   MOUTH SURGERY     RIGHT HEART CATH N/A 04/25/2023   Procedure: RIGHT HEART CATH;  Surgeon: Verlin Lonni BIRCH, MD;  Location: [facility] INVASIVE CV LAB;  Service: Cardiovascular;  Laterality: N/A;   RIGHT OOPHORECTOMY  1999    Social History:  reports that she is a non-smoker but has been exposed to tobacco smoke. She has never used smokeless tobacco. She reports that she does not drink alcohol and does not use drugs.   Allergies  Allergen Reactions   Gelatin Swelling    No jello of any kind   Iodinated Contrast Media Anaphylaxis, Hives, Itching and Swelling   Methylprednisolone  Sodium Succ Shortness  Of Breath   Dicyclomine Hcl Anxiety and Other (See Comments)    shaky and sick GI upset, sick to stomach   Doxycycline Hives and Rash    rash   Glutamic Acid Itching and Swelling   Ketorolac Hives and Rash    Tolerated Toradol in the ED without side effect, hives, or complaint on 02/17/2022   Lisinopril  Swelling    Angioedema (facial, lip, or tongue swelling)   Metformin Nausea And Vomiting and Other (See Comments)    Dyspepsia sick to stomach It made me sick where I couldn't get out of  the bed   Nitroglycerin  Nausea And Vomiting and Swelling    Pt states it makes her too sick   Nsaids Other (See Comments)    Stomach pain/bleeding   Other Itching, Swelling and Other (See Comments)    Solu-Medrol  Mix-O-Vial   Quetiapine Other (See Comments)   Vancomycin     Itching and erythema at infusion site within a few minutes of starting infusion. No systemic evidence of Red Man Syndrome   Blueberry Flavoring Agent (Non-Screening) Nausea And Vomiting and Rash   Cephalexin Nausea And Vomiting and Nausea Only    sick to stomach   Fish Allergy Rash   Ibuprofen Nausea And Vomiting    GI upset Reports stomach upset 2/2 hx gastric polyps. Not true allergy.   Nitrofuran Derivatives Itching and Nausea And Vomiting    sick to stomach   Shellfish Allergy Itching, Swelling and Rash   Strawberry Flavoring Agent (Non-Screening) Nausea And Vomiting and Rash   Sulfa Antibiotics Nausea And Vomiting and Other (See Comments)    It just makes me real sick    Family History  Problem Relation Age of Onset   Cirrhosis Mother 47       ?etiology   Cirrhosis Father        ?etiology   Cirrhosis Sister 30       ?etiology   Diverticulitis Sister      Prior to Admission medications   Medication Sig Start Date End Date Taking? Authorizing Provider  acetaminophen  (TYLENOL ) 325 MG tablet Take 325 mg by mouth every 4 (four) hours as needed for mild pain, headache or fever. 05/01/21   [provider]  apixaban  (ELIQUIS ) 5 MG TABS tablet Take 1 tablet (5 mg total) by mouth 2 (two) times daily. 11/17/17   Suzette Pac, MD  atorvastatin  (LIPITOR) 40 MG tablet Take 40 mg by mouth every morning.    [provider]  carvedilol  (COREG ) 25 MG tablet Take 0.5 tablets (12.5 mg total) by mouth 2 (two) times daily with a meal. Patient taking differently: Take 12.5 mg by mouth daily with breakfast. 05/03/23   Tobie Paticia BROCKS, MD  clonazePAM  (KLONOPIN ) 1 MG tablet Take 1 mg by mouth 2 (two) times  daily.    [provider]  clopidogrel  (PLAVIX ) 75 MG tablet Take 75 mg by mouth daily.    [provider]  diphenhydrAMINE  (BENADRYL ) 25 MG tablet Take 25 mg by mouth at bedtime as needed for sleep. 04/25/21   [provider]  DM-Doxylamine-Acetaminophen  (NYQUIL HBP COLD & FLU PO) Take 15 mLs by mouth at bedtime as needed (congestion, sore throat, cold).    [provider]  gabapentin  (NEURONTIN ) 300 MG capsule Take 300 mg by mouth 3 (three) times daily. Patient not taking: Reported on 06/05/2023    [provider]  insulin  detemir (LEVEMIR ) 100 UNIT/ML injection Inject 50 Units into the skin 2 (  two) times daily.    [provider]  isosorbide  mononitrate (IMDUR ) 60 MG 24 hr tablet Take 1 tablet (60 mg total) by mouth every morning. Patient not taking: Reported on 06/05/2023 05/04/23   Tobie Paticia BROCKS, MD  NOVOLOG  FLEXPEN 100 UNIT/ML FlexPen Inject 20 Units into the skin 4 (four) times daily. 4-5 times daily 05/13/15   [provider]  sertraline  (ZOLOFT ) 100 MG tablet Take 100 mg by mouth daily.    [provider]  trazodone  (DESYREL ) 300 MG tablet Take 300 mg by mouth at bedtime. 03/03/23   [provider]    Physical Exam: BP 122/60   Pulse 67   Temp 97.7 F (36.5 C) (Oral)   Resp 12   SpO2 93%   General: 63 y.o. year-old female appearing, but in no acute distress.  Alert and oriented x3. HEENT: NCAT, EOMI Neck: Supple, trachea medial Cardiovascular: Regular rate and rhythm with no rubs or gallops.  No thyromegaly or JVD noted.  No lower extremity edema. 2/4 pulses in all 4 extremities. Respiratory: Clear to auscultation with no wheezes or rales. Good inspiratory effort. Abdomen: Soft, nontender nondistended with normal bowel sounds x4 quadrants. Muskuloskeletal: No cyanosis, clubbing or edema noted bilaterally Neuro: CN II-XII intact, strength 5/5 x 4, sensation, reflexes intact Skin: No ulcerative lesions  noted or rashes Psychiatry: Judgement and insight appear normal. Mood is appropriate for condition and setting          Labs on Admission:  Basic Metabolic Panel: Recent Labs  Lab 05/05/24 1835  NA 136  K 4.9  CL 105  CO2 18*  GLUCOSE 163*  BUN 45*  CREATININE 5.57*  CALCIUM  8.7*   Liver Function Tests: Recent Labs  Lab 05/05/24 1936  AST 11*  ALT 11  ALKPHOS 50  BILITOT 0.7  PROT 8.9*  ALBUMIN  3.3*   Recent Labs  Lab 05/05/24 1936  LIPASE 44   No results for input(s): AMMONIA in the last 168 hours. CBC: Recent Labs  Lab 05/05/24 1927  WBC 6.0  HGB 8.9*  HCT 27.3*  MCV 92.2  PLT 200   Cardiac Enzymes: No results for input(s): CKTOTAL, CKMB, CKMBINDEX, TROPONINI in the last 168 hours.  BNP (last 3 results) Recent Labs    05/28/23 2046  BNP 38.0    ProBNP (last 3 results) No results for input(s): PROBNP in the last 8760 hours.  CBG: No results for input(s): GLUCAP in the last 168 hours.  Radiological Exams on Admission: CT ABDOMEN PELVIS WO CONTRAST Result Date: 05/05/2024 CLINICAL DATA:  Abdominal pain EXAM: CT ABDOMEN AND PELVIS WITHOUT CONTRAST TECHNIQUE: Multidetector CT imaging of the abdomen and pelvis was performed following the standard protocol without IV contrast. RADIATION DOSE REDUCTION: This exam was performed according to the departmental dose-optimization program which includes automated exposure control, adjustment of the mA and/or kV according to patient size and/or use of iterative reconstruction technique. COMPARISON:  06/05/2023 FINDINGS: Lower chest: No acute findings. Coronary artery and aortic atherosclerosis. Hepatobiliary: No focal liver abnormality is seen. Status post cholecystectomy. No biliary dilatation. Pancreas: No focal abnormality or ductal dilatation. Spleen: No focal abnormality.  Normal size. Adrenals/Urinary Tract: Punctate nonobstructing bilateral renal stones. No ureteral stones or hydronephrosis.  Adrenal glands and urinary bladder unremarkable. Stomach/Bowel: Stomach, large and small bowel grossly unremarkable. Vascular/Lymphatic: No evidence of aneurysm or adenopathy. Aortic atherosclerosis. Reproductive: Prior hysterectomy.  No adnexal masses. Other: No free fluid or free air. Musculoskeletal: No acute bony abnormality. IMPRESSION: No  acute findings in the abdomen or pelvis. Punctate bilateral nephrolithiasis.  No hydronephrosis. Coronary artery disease, aortic atherosclerosis. Electronically Signed   By: Franky Crease M.D.   On: 05/05/2024 23:01   DG Chest 2 View Result Date: 05/05/2024 CLINICAL DATA:  Chest and abdominal pain. EXAM: CHEST - 2 VIEW COMPARISON:  06/05/2023 and CT chest 04/15/2023. FINDINGS: Trachea is midline. Heart size normal. Lungs are clear. No pleural fluid. Flowing anterior osteophytosis in the thoracic spine. IMPRESSION: No acute findings. Electronically Signed   By: Newell Eke M.D.   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)  Chronic heart failure with preserved ejection fraction (HFpEF) (HCC)  Coronary artery disease  Mixed hyperlipidemia  Type 2 diabetes mellitus with hyperglycemia (HCC)  Principal Problem:   Chest pain Active Problems:   Mixed hyperlipidemia   Coronary artery disease   Type 2 diabetes mellitus with hyperglycemia (HCC)   Acute kidney injury superimposed on stage 5 chronic kidney disease, not on chronic dialysis (HCC)   Chronic heart failure with preserved ejection fraction (HFpEF) (HCC)   Near syncope  Chest pain Continue telemetry  Troponins x2 was flat x 4 EKG personally reviewed showed normal sinus rhythm at rate of 69 bpm Cardiology will be consulted to help decide if Stress test is  needed in am Versus other  diagnostic modalities.     Near -Syncope Continue telemetry and watch for arrhythmias Troponins x 2 was flat at 4 continue to trend troponin EKG showed normal sinus rhythm at rate of 69 bpm Echocardiogram done on April 22, 2023 showed LVEF of 60-65 %.   Echocardiogram will be done to rule out significant aortic stenosis or other outflow obstruction, and also to evaluate EF and to rule out segmental/Regional wall motion abnormalities.  Carotid artery Dopplers will be done to rule out hemodynamically significant stenosis  AKI on CKD 5 Creatinine 5.57 (creatinine was 9.74 on 06/08/2023) Gentle hydration will be provided Renally adjust medications, avoid nephrotoxic agents/dehydration/hypotension Nephrology will be consulted  Chronic HFpEF Echo done on 04/22/2023 showed an EF 60 to 65%, normal RV function, trivial TR Continue Carvedilol    Coronary artery disease s/p DES-RCA 2022 ([place])  Continue atorvastatin  Continue imdur   T2DM with hyperglycemia Continue Semglee  10 units nightly and adjust dose accordingly Continue ISS and hypoglycemia protocol  Mixed Hyperlipidemia Continue atorvastatin     DVT prophylaxis: Eliquis   Code Status: Full code  Family Communication: None at bedside  Consults: Cardiology, nephrology.  Severity of Illness: The appropriate patient status for this patient is INPATIENT. Inpatient status is judged to be reasonable and necessary in order to provide the required intensity of service to ensure the patient's safety. The patient's presenting symptoms, physical exam findings, and initial radiographic and laboratory data in the context of their chronic comorbidities is felt to place them at high risk for further clinical deterioration. Furthermore, it is not anticipated that the patient will be medically stable for discharge from the hospital within 2 midnights of admission.   * I certify that at the point of admission it is my clinical  judgment that the patient will require inpatient hospital care spanning beyond 2 midnights from the point of admission due to high intensity of service, high risk for further  deterioration and high frequency of surveillance required.*  Author: Casilda Pickerill, DO 05/06/2024 2:50 AM  For on call review [email or web address].

## 2024-05-05 NOTE — ED Provider Notes (Cosign Needed Addendum)
 [place] EMERGENCY DEPARTMENT AT [facility] Provider Note   CSN: [id number] Arrival date & time: 05/05/24  1713     Patient presents with: Chest Pain   [name] is a 63 y.o. female.  History of heart attack in [place]  about 2 years ago resulting in stent.  History of A-fib on Eliquis , stage IV CKD, diabetes, obesity anemia, heart failure.  She presents the ER today stating she started having chest pain and abdominal pain between 4 and 5 PM tonight while at rest, denies exertional pain.  Pain radiates to the right shoulder.  She reports she does have some mild shortness of breath as well but no pleuritic pain, no hemoptysis.  She states she was feeling very lightheaded while sitting in the waiting room and started sweating and folic she was going to pass out but did not lose consciousness.  States she is having left lower abdominal pain with nausea.  Denies urinary symptoms.    Chest Pain      Prior to Admission medications   Medication Sig Start Date End Date Taking? Authorizing Provider  acetaminophen  (TYLENOL ) 325 MG tablet Take 325 mg by mouth every 4 (four) hours as needed for mild pain, headache or fever. 05/01/21   [provider]  apixaban  (ELIQUIS ) 5 MG TABS tablet Take 1 tablet (5 mg total) by mouth 2 (two) times daily. 11/17/17   Suzette Pac, MD  atorvastatin  (LIPITOR) 40 MG tablet Take 40 mg by mouth every morning.    [provider]  carvedilol  (COREG ) 25 MG tablet Take 0.5 tablets (12.5 mg total) by mouth 2 (two) times daily with a meal. Patient taking differently: Take 12.5 mg by mouth daily with breakfast. 05/03/23   Tobie Paticia BROCKS, MD  clonazePAM  (KLONOPIN ) 1 MG tablet Take 1 mg by mouth 2 (two) times daily.    [provider]  clopidogrel  (PLAVIX ) 75 MG tablet Take 75 mg by mouth daily.    [provider]  diphenhydrAMINE  (BENADRYL ) 25 MG tablet Take 25 mg by mouth at bedtime as needed for sleep. 04/25/21    [provider]  DM-Doxylamine-Acetaminophen  (NYQUIL HBP COLD & FLU PO) Take 15 mLs by mouth at bedtime as needed (congestion, sore throat, cold).    [provider]  gabapentin  (NEURONTIN ) 300 MG capsule Take 300 mg by mouth 3 (three) times daily. Patient not taking: Reported on 06/05/2023    [provider]  insulin  detemir (LEVEMIR ) 100 UNIT/ML injection Inject 50 Units into the skin 2 (two) times daily.    [provider]  isosorbide  mononitrate (IMDUR ) 60 MG 24 hr tablet Take 1 tablet (60 mg total) by mouth every morning. Patient not taking: Reported on 06/05/2023 05/04/23   Tobie Paticia BROCKS, MD  NOVOLOG  FLEXPEN 100 UNIT/ML FlexPen Inject 20 Units into the skin 4 (four) times daily. 4-5 times daily 05/13/15   [provider]  sertraline  (ZOLOFT ) 100 MG tablet Take 100 mg by mouth daily.    [provider]  trazodone  (DESYREL ) 300 MG tablet Take 300 mg by mouth at bedtime. 03/03/23   [provider]    Allergies: Gelatin, Iodinated contrast media, Methylprednisolone  sodium succ, Dicyclomine hcl, Doxycycline, Glutamic acid, Ketorolac, Lisinopril , Metformin, Nitroglycerin , Nsaids, Other, Quetiapine, Vancomycin, Blueberry flavoring agent (non-screening), Cephalexin, Fish allergy, Ibuprofen, Nitrofuran derivatives, Shellfish allergy, Strawberry flavoring agent (non-screening), and Sulfa antibiotics    Review of Systems  Cardiovascular:  Positive for chest pain.    Updated Vital Signs  BP (!) 149/57   Pulse 78   Temp 98.9 F (37.2 C) (Oral)   Resp 20   SpO2 97%   Physical Exam  (all labs ordered are listed, but only abnormal results are displayed) Labs Reviewed  BASIC METABOLIC PANEL WITH GFR - Abnormal; Notable for the following components:      Result Value   CO2 18 (*)    Glucose, Bld 163 (*)    BUN 45 (*)    Creatinine, Ser 5.57 (*)    Calcium  8.7 (*)    GFR, Estimated 8 (*)    All other components within normal limits   CBC - Abnormal; Notable for the following components:   RBC 2.96 (*)    Hemoglobin 8.9 (*)    HCT 27.3 (*)    RDW 16.0 (*)    All other components within normal limits  HEPATIC FUNCTION PANEL - Abnormal; Notable for the following components:   Total Protein 8.9 (*)    Albumin  3.3 (*)    AST 11 (*)    All other components within normal limits  LIPASE, BLOOD  URINALYSIS, ROUTINE W REFLEX MICROSCOPIC  TROPONIN I (HIGH SENSITIVITY)  TROPONIN I (HIGH SENSITIVITY)    EKG: None  Radiology: CT ABDOMEN PELVIS WO CONTRAST Result Date: 05/05/2024 CLINICAL DATA:  Abdominal pain EXAM: CT ABDOMEN AND PELVIS WITHOUT CONTRAST TECHNIQUE: Multidetector CT imaging of the abdomen and pelvis was performed following the standard protocol without IV contrast. RADIATION DOSE REDUCTION: This exam was performed according to the departmental dose-optimization program which includes automated exposure control, adjustment of the mA and/or kV according to patient size and/or use of iterative reconstruction technique. COMPARISON:  06/05/2023 FINDINGS: Lower chest: No acute findings. Coronary artery and aortic atherosclerosis. Hepatobiliary: No focal liver abnormality is seen. Status post cholecystectomy. No biliary dilatation. Pancreas: No focal abnormality or ductal dilatation. Spleen: No focal abnormality.  Normal size. Adrenals/Urinary Tract: Punctate nonobstructing bilateral renal stones. No ureteral stones or hydronephrosis. Adrenal glands and urinary bladder unremarkable. Stomach/Bowel: Stomach, large and small bowel grossly unremarkable. Vascular/Lymphatic: No evidence of aneurysm or adenopathy. Aortic atherosclerosis. Reproductive: Prior hysterectomy.  No adnexal masses. Other: No free fluid or free air. Musculoskeletal: No acute bony abnormality. IMPRESSION: No acute findings in the abdomen or pelvis. Punctate bilateral nephrolithiasis.  No hydronephrosis. Coronary artery disease, aortic atherosclerosis.  Electronically Signed   By: Franky Crease M.D.   On: 05/05/2024 23:01   DG Chest 2 View Result Date: 05/05/2024 CLINICAL DATA:  Chest and abdominal pain. EXAM: CHEST - 2 VIEW COMPARISON:  06/05/2023 and CT chest 04/15/2023. FINDINGS: Trachea is midline. Heart size normal. Lungs are clear. No pleural fluid. Flowing anterior osteophytosis in the thoracic spine. IMPRESSION: No acute findings. Electronically Signed   By: Newell Eke M.D.   On: 05/05/2024 17:58     Procedures   Medications Ordered in the ED  famotidine  (PEPCID ) IVPB 20 mg premix (has no administration in time range)  lactated ringers  bolus 500 mL (has no administration in time range)  ondansetron  (ZOFRAN ) injection 4 mg (4 mg Intravenous Given 05/05/24 2221)  morphine  (PF) 4 MG/ML injection 4 mg (4 mg Intravenous Given 05/05/24 2224)  lactated ringers  bolus 500 mL (500 mLs Intravenous New Bag/Given 05/05/24 2244)                                    Medical Decision Making Differential diagnosis includes but  not limited to ACS, PE, pneumonia, gastritis, pancreatitis, cholecystitis, appendicitis, UTI, other  ED course: Patient is here for chest pain as well as abdominal pain and nausea since between 4 and 5 PM today.  Pain is not exertional, described as pressure and radiates to the left shoulder.  EKG shows no acute changes and troponin is negative.  She is compliant with her Eliquis  so do not suspect PE and she is not hypoxic or tachycardic.  Her creatinine does appear to have worsened since her PCP visit in April from 3.3-5.57.  Having lower abdominal pain today as well and is tender in bilateral lower quadrants.  She does still make urine so will check urine and CT abdomen pelvis without contrast.   Troponin negative, lipase normal, hepatic function panel reassuring, creatinine today 5.57 with baseline 2.3 in April of this year.  Evaluation abdominal pain and Zofran  patient states chest pain is better still feeling overall  weak and unwell.  She also had near syncopal episode in the waiting room today.  I do not suspect PE, she is compliant with her Eliquis , not having pleuritic pain, she hypoxic.  No calf pain or swelling.  Patient not having back pain or tearing pain to suggest dissection, she does not have a widened mediastinum.  She is moderate risk heart score and not established with cardiology episode discussed with hospitalist regarding AKI on CKD, near syncope and chest pain and he is agreeable with admission.  Discussed with Dr. Adefeso.  Patient was maintained on cardiac monitor, she is in sinus rhythm.  Amount and/or Complexity of Data Reviewed External Data Reviewed: labs and notes.    Details: Chart from [facility] PCP in April 2025 shows creatinine was 3.3 Labs: ordered.    Details: Creatinine today 5.57 up from 3.3 and April 2025 Radiology: ordered and independent interpretation performed.    Details: Chest x-ray shows no pulmonary edema or infiltrate agree with radiology read  Risk Prescription drug management. Decision regarding hospitalization.        Final diagnoses:  Chest pain, unspecified type  Near syncope  AKI (acute kidney injury) [facility])    ED Discharge Orders     None          Suellen Sherran LABOR, PA-C 05/05/24 [address] 05/05/24 2344    Suzette Pac, MD 05/07/24 1230

## 2024-05-05 NOTE — ED Triage Notes (Signed)
 Pt presents with chest pain and abdominal pain x 2 hours.  Reports having a heart attack in the past.  Nauseated but no vomiting.

## 2024-05-06 ENCOUNTER — Inpatient Hospital Stay (HOSPITAL_COMMUNITY)

## 2024-05-06 DIAGNOSIS — N184 Chronic kidney disease, stage 4 (severe): Secondary | ICD-10-CM | POA: Diagnosis not present

## 2024-05-06 DIAGNOSIS — R55 Syncope and collapse: Secondary | ICD-10-CM

## 2024-05-06 DIAGNOSIS — R079 Chest pain, unspecified: Secondary | ICD-10-CM | POA: Diagnosis not present

## 2024-05-06 DIAGNOSIS — I2510 Atherosclerotic heart disease of native coronary artery without angina pectoris: Secondary | ICD-10-CM | POA: Diagnosis not present

## 2024-05-06 DIAGNOSIS — R0789 Other chest pain: Secondary | ICD-10-CM | POA: Diagnosis not present

## 2024-05-06 DIAGNOSIS — I480 Paroxysmal atrial fibrillation: Secondary | ICD-10-CM | POA: Diagnosis not present

## 2024-05-06 DIAGNOSIS — N179 Acute kidney failure, unspecified: Secondary | ICD-10-CM | POA: Diagnosis not present

## 2024-05-06 DIAGNOSIS — I5032 Chronic diastolic (congestive) heart failure: Secondary | ICD-10-CM

## 2024-05-06 DIAGNOSIS — E785 Hyperlipidemia, unspecified: Secondary | ICD-10-CM

## 2024-05-06 DIAGNOSIS — E1165 Type 2 diabetes mellitus with hyperglycemia: Secondary | ICD-10-CM | POA: Diagnosis not present

## 2024-05-06 DIAGNOSIS — I10 Essential (primary) hypertension: Secondary | ICD-10-CM

## 2024-05-06 LAB — COMPREHENSIVE METABOLIC PANEL WITH GFR
ALT: 22 U/L (ref 0–44)
AST: 32 U/L (ref 15–41)
Albumin: 3 g/dL — ABNORMAL LOW (ref 3.5–5.0)
Alkaline Phosphatase: 71 U/L (ref 38–126)
Anion gap: 11 (ref 5–15)
BUN: 45 mg/dL — ABNORMAL HIGH (ref 8–23)
CO2: 19 mmol/L — ABNORMAL LOW (ref 22–32)
Calcium: 8.6 mg/dL — ABNORMAL LOW (ref 8.9–10.3)
Chloride: 106 mmol/L (ref 98–111)
Creatinine, Ser: 5.33 mg/dL — ABNORMAL HIGH (ref 0.44–1.00)
GFR, Estimated: 9 mL/min — ABNORMAL LOW (ref >60)
Glucose, Bld: 126 mg/dL — ABNORMAL HIGH (ref 70–99)
Potassium: 4.6 mmol/L (ref 3.5–5.1)
Sodium: 136 mmol/L (ref 135–145)
Total Bilirubin: 0.5 mg/dL (ref 0.0–1.2)
Total Protein: 8.1 g/dL (ref 6.5–8.1)

## 2024-05-06 LAB — IRON AND TIBC
Iron: 71 ug/dL (ref 28–170)
Saturation Ratios: 32 % — ABNORMAL HIGH (ref 10.4–31.8)
TIBC: 220 ug/dL — ABNORMAL LOW (ref 250–450)
UIBC: 149 ug/dL

## 2024-05-06 LAB — URINALYSIS, ROUTINE W REFLEX MICROSCOPIC
Bilirubin Urine: NEGATIVE
Glucose, UA: NEGATIVE mg/dL
Ketones, ur: NEGATIVE mg/dL
Nitrite: NEGATIVE
Protein, ur: 100 mg/dL — AB
Specific Gravity, Urine: 1.012 (ref 1.005–1.030)
pH: 5 (ref 5.0–8.0)

## 2024-05-06 LAB — CBC
HCT: 23.8 % — ABNORMAL LOW (ref 36.0–46.0)
Hemoglobin: 7.9 g/dL — ABNORMAL LOW (ref 12.0–15.0)
MCH: 30.2 pg (ref 26.0–34.0)
MCHC: 33.2 g/dL (ref 30.0–36.0)
MCV: 90.8 fL (ref 80.0–100.0)
Platelets: 171 K/uL (ref 150–400)
RBC: 2.62 MIL/uL — ABNORMAL LOW (ref 3.87–5.11)
RDW: 15.9 % — ABNORMAL HIGH (ref 11.5–15.5)
WBC: 5.1 K/uL (ref 4.0–10.5)
nRBC: 0 % (ref 0.0–0.2)

## 2024-05-06 LAB — ECHOCARDIOGRAM COMPLETE
Area-P 1/2: 4.31 cm2
Calc EF: 75.4 %
S' Lateral: 3.4 cm
Single Plane A2C EF: 77.7 %
Single Plane A4C EF: 74.8 %

## 2024-05-06 LAB — CBG MONITORING, ED
Glucose-Capillary: 122 mg/dL — ABNORMAL HIGH (ref 70–99)
Glucose-Capillary: 126 mg/dL — ABNORMAL HIGH (ref 70–99)

## 2024-05-06 LAB — HEMOGLOBIN A1C
Hgb A1c MFr Bld: 6.3 % — ABNORMAL HIGH (ref 4.8–5.6)
Hgb A1c MFr Bld: 6.5 % — ABNORMAL HIGH (ref 4.8–5.6)
Mean Plasma Glucose: 134.11 mg/dL
Mean Plasma Glucose: 139.85 mg/dL

## 2024-05-06 LAB — PHOSPHORUS: Phosphorus: 5.4 mg/dL — ABNORMAL HIGH (ref 2.5–4.6)

## 2024-05-06 LAB — FOLATE: Folate: 12.9 ng/mL (ref >5.9)

## 2024-05-06 LAB — FERRITIN: Ferritin: 289 ng/mL (ref 11–307)

## 2024-05-06 LAB — VITAMIN B12: Vitamin B-12: 172 pg/mL — ABNORMAL LOW (ref 180–914)

## 2024-05-06 LAB — GLUCOSE, CAPILLARY
Glucose-Capillary: 114 mg/dL — ABNORMAL HIGH (ref 70–99)
Glucose-Capillary: 132 mg/dL — ABNORMAL HIGH (ref 70–99)

## 2024-05-06 LAB — MAGNESIUM: Magnesium: 1.3 mg/dL — ABNORMAL LOW (ref 1.7–2.4)

## 2024-05-06 LAB — HIV ANTIBODY (ROUTINE TESTING W REFLEX): HIV Screen 4th Generation wRfx: NONREACTIVE

## 2024-05-06 MED ORDER — INSULIN GLARGINE 100 UNIT/ML ~~LOC~~ SOLN
10.0000 [IU] | Freq: Every day | SUBCUTANEOUS | Status: DC
  Filled 2024-05-06: qty 0.1

## 2024-05-06 MED ORDER — HYDROMORPHONE HCL 1 MG/ML IJ SOLN
0.5000 mg | INTRAMUSCULAR | Status: DC | PRN
  Administered 2024-05-06 – 2024-05-07 (×5): 0.5 mg via INTRAVENOUS
  Filled 2024-05-06 (×5): qty 0.5

## 2024-05-06 MED ORDER — CARVEDILOL 12.5 MG PO TABS
12.5000 mg | ORAL_TABLET | Freq: Every day | ORAL | Status: DC
  Administered 2024-05-06 – 2024-05-08 (×3): 12.5 mg via ORAL
  Filled 2024-05-06 (×3): qty 1

## 2024-05-06 MED ORDER — ACETAMINOPHEN 650 MG RE SUPP: 650.0000 mg | Freq: Four times a day (QID) | RECTAL | Status: DC | PRN

## 2024-05-06 MED ORDER — INSULIN ASPART 100 UNIT/ML IJ SOLN: 0.0000 [IU] | Freq: Every day | INTRAMUSCULAR | Status: DC

## 2024-05-06 MED ORDER — ISOSORBIDE MONONITRATE ER 60 MG PO TB24
60.0000 mg | ORAL_TABLET | Freq: Every morning | ORAL | Status: DC
  Administered 2024-05-06 – 2024-05-08 (×3): 60 mg via ORAL
  Filled 2024-05-06 (×3): qty 1

## 2024-05-06 MED ORDER — PERFLUTREN LIPID MICROSPHERE
1.0000 mL | INTRAVENOUS | Status: AC | PRN
Start: 2024-05-06 — End: 2024-05-06
  Administered 2024-05-06: 1 mL via INTRAVENOUS

## 2024-05-06 MED ORDER — LACTATED RINGERS IV SOLN: INTRAVENOUS | Status: AC

## 2024-05-06 MED ORDER — ACETAMINOPHEN 325 MG PO TABS
650.0000 mg | ORAL_TABLET | Freq: Four times a day (QID) | ORAL | Status: DC | PRN
Start: 2024-05-06 — End: 2024-05-08

## 2024-05-06 MED ORDER — ATORVASTATIN CALCIUM 40 MG PO TABS
40.0000 mg | ORAL_TABLET | Freq: Every morning | ORAL | Status: DC
  Administered 2024-05-06 – 2024-05-08 (×3): 40 mg via ORAL
  Filled 2024-05-06 (×3): qty 1

## 2024-05-06 MED ORDER — APIXABAN 5 MG PO TABS
5.0000 mg | ORAL_TABLET | Freq: Two times a day (BID) | ORAL | Status: DC
  Administered 2024-05-06 – 2024-05-08 (×5): 5 mg via ORAL
  Filled 2024-05-06 (×5): qty 1

## 2024-05-06 MED ORDER — PANTOPRAZOLE SODIUM 40 MG PO TBEC
40.0000 mg | DELAYED_RELEASE_TABLET | Freq: Two times a day (BID) | ORAL | Status: DC
  Administered 2024-05-06 – 2024-05-08 (×5): 40 mg via ORAL
  Filled 2024-05-06 (×5): qty 1

## 2024-05-06 MED ORDER — INSULIN ASPART 100 UNIT/ML IJ SOLN
0.0000 [IU] | Freq: Three times a day (TID) | INTRAMUSCULAR | Status: DC
  Administered 2024-05-06 (×2): 1 [IU] via SUBCUTANEOUS
  Administered 2024-05-07 – 2024-05-08 (×4): 2 [IU] via SUBCUTANEOUS
  Filled 2024-05-06 (×2): qty 1

## 2024-05-06 MED ORDER — INSULIN GLARGINE-YFGN 100 UNIT/ML ~~LOC~~ SOLN
10.0000 [IU] | Freq: Every day | SUBCUTANEOUS | Status: DC
  Administered 2024-05-07: 10 [IU] via SUBCUTANEOUS
  Filled 2024-05-06 (×3): qty 0.1

## 2024-05-06 MED ORDER — ONDANSETRON HCL 4 MG PO TABS
4.0000 mg | ORAL_TABLET | Freq: Four times a day (QID) | ORAL | Status: DC | PRN
Start: 2024-05-06 — End: 2024-05-08
  Administered 2024-05-06 – 2024-05-07 (×2): 4 mg via ORAL
  Filled 2024-05-06 (×2): qty 1

## 2024-05-06 MED ORDER — CYANOCOBALAMIN 1000 MCG/ML IJ SOLN
1000.0000 ug | Freq: Once | INTRAMUSCULAR | Status: AC
  Administered 2024-05-06: 1000 ug via INTRAMUSCULAR
  Filled 2024-05-06: qty 1

## 2024-05-06 MED ORDER — MAGNESIUM SULFATE IN D5W 1-5 GM/100ML-% IV SOLN
1.0000 g | Freq: Once | INTRAVENOUS | Status: AC
  Administered 2024-05-06: 1 g via INTRAVENOUS
  Filled 2024-05-06: qty 100

## 2024-05-06 MED ORDER — VITAMIN B-12 100 MCG PO TABS
500.0000 ug | ORAL_TABLET | Freq: Every day | ORAL | Status: DC
  Administered 2024-05-07 – 2024-05-08 (×2): 500 ug via ORAL
  Filled 2024-05-06 (×2): qty 5

## 2024-05-06 MED ORDER — DARBEPOETIN ALFA 60 MCG/0.3ML IJ SOSY
60.0000 ug | PREFILLED_SYRINGE | INTRAMUSCULAR | Status: DC
  Administered 2024-05-06: 60 ug via SUBCUTANEOUS
  Filled 2024-05-06 (×3): qty 0.3

## 2024-05-06 MED ORDER — ONDANSETRON HCL 4 MG/2ML IJ SOLN
4.0000 mg | Freq: Four times a day (QID) | INTRAMUSCULAR | Status: DC | PRN
  Administered 2024-05-06 – 2024-05-07 (×3): 4 mg via INTRAVENOUS
  Filled 2024-05-06 (×3): qty 2

## 2024-05-06 NOTE — Discharge Instructions (Signed)

## 2024-05-06 NOTE — Consult Note (Signed)
 Cardiology Consultation   Patient ID: [name] MRN: [id number]; DOB: [birth year]  Admit date: 05/05/2024 Date of Consult: 05/06/2024  PCP:  System, Provider Not In    [facility] Providers Cardiologist:  Holly SHAUNNA Maywood, MD      Patient Profile: [name] is a 63 y.o. female with a hx of CAD s/p RCA PCI in 2022 in [place], [place] , Chronic HFpEF (04/2023 60-65% EF), hypertension, hyperlipidemia, T2DM, DVT on Eliquis , anemia in CKD, paroxysmal atrial fibrillation, morbid obesity who is being seen 05/06/2024 for the evaluation of chest pain  at the request of Dr. Evonnie.  History of Present Illness: [name] was last seen in heart care consult during hospitalization from 7/30-8/17/2024 for chest pain.  Echo 04/22/2023 showed EF 60 to 65% with trivial TR.  No LHC performed, did get RHC with RA mean of 18, RVSP 48, with PCWP 23.  Hospitalization complicated by AKI on CKD.  She was discharged on amlodipine  2.5 mg, Eliquis  5 mg twice daily, Lipitor 40 mg daily, Coreg  12.5 mg daily, Plavix  75 mg daily, Zetia  10 mg daily and Imdur  60 mg daily.  Patient was recently admitted to [facility] in [place]  about 1 week ago due to pneumonia.  Transfused with 1 unit of PRBC due to history of GI bleed with unknown source (4 months earlier, she received 2 units of PRBCs at [facility].  EGD done at that time was normal per patient.)   Presents to [facility] ED 8/20 for CP.  K4.6, CR 5.33 (9.74 in 05/2023), Mg 1.3, Hgb 7.9, A1c 6.3 TN 4 > 4 EKG: Sinus bradycardia, HR 59 with no significant changes. CXR without active disease. CT A/P without contrast showed no acute findings but signs of CAD aortic atherosclerosis. ED treated with IV famotidine , IV Zofran , IV morphine  and IV hydration was provided.   On interview, reports intermittent chest pain and SOB started yesterday around 4 to 5 PM while laying down.  CP described as sharp and like something sitting on chest, 8 out of 10, lasted  less than 5 minutes, radiated to the left shoulder.  Not associated with position changes, cough or deep breaths.  Associated with SOB, diaphoresis, nausea and dizziness.  Denied vomiting, palpitations and syncope.  Reports prior heart attack resulting in stent about 3 to 4 years ago in [place], [place] .  Notes this chest pain is similar to 2 prior episode.  Reports SOB with minimal exertion for several months.  She sleeps with 2 pillows because it is easier to breathe.  Denies any edema.  Notes ongoing dizziness, weakness  and presyncope for 3 to 4 months.  She uses a walker in the home and uses a wheelchair when out in grocery stores.  She is able to run after her 11-year-old grandson.  Reports daily medication compliance.  Denies any EtOH/drug use.  Also denied any tobacco use and reported secondhand smoke, however previous notes report tobacco use X 23 years.  Reports low-sodium diet.  Daily fluid intake includes 1 bottle of soda and 1 bottle of Hawaiian punch/coconut water.  Encouraged to increase daily water intake. Family history significant for 2 brothers with CABG, dad with multiple MIs/HTN/HLD who died in his 90s.   Past Medical History:  Diagnosis Date   Asthma    Atrial fibrillation (HCC)    Bipolar 1 disorder (HCC)    Chronic back pain    Chronic chest pain    Diabetes mellitus without complication (HCC)  Gastroparesis    GERD (gastroesophageal reflux disease)    Hyperlipemia    Hypertension    IBS (irritable bowel syndrome)    Normal cardiac stress test 02/2014   [facility]    Past Surgical History:  Procedure Laterality Date   ABDOMINAL HYSTERECTOMY  2001   APPENDECTOMY  10/2005   CHOLECYSTECTOMY  10/2005   COLONOSCOPY  2007   [name] ([name])-pt reports hemorrhoids   ESOPHAGOGASTRODUODENOSCOPY  02/28/2006   [name], normal on daily PPI, myultiple hyperplastic polyps   MOUTH SURGERY     RIGHT HEART CATH N/A 04/25/2023   Procedure: RIGHT HEART CATH;  Surgeon: Holly Lonni BIRCH, MD;  Location: [facility] IN[facility];  Service: Cardiovascular;  Laterality: N/A;   RIGHT OOPHORECTOMY  1999     Home Medications:  Prior to Admission medications   Medication Sig Start Date End Date Taking? Authorizing Provider  apixaban  (ELIQUIS ) 5 MG TABS tablet Take 1 tablet (5 mg total) by mouth 2 (two) times daily. 11/17/17  Yes Zammit, Joseph, MD  atorvastatin  (LIPITOR) 40 MG tablet Take 40 mg by mouth every morning.    [provider]  carvedilol  (COREG ) 25 MG tablet Take 0.5 tablets (12.5 mg total) by mouth 2 (two) times daily with a meal. Patient taking differently: Take 12.5 mg by mouth daily with breakfast. 05/03/23   Holly Paticia BROCKS, MD  clonazePAM  (KLONOPIN ) 1 MG tablet Take 1 mg by mouth 2 (two) times daily.    [provider]  clopidogrel  (PLAVIX ) 75 MG tablet Take 75 mg by mouth daily.    [provider]  diphenhydrAMINE  (BENADRYL ) 25 MG tablet Take 25 mg by mouth at bedtime as needed for sleep. 04/25/21   [provider]  DM-Doxylamine-Acetaminophen  (NYQUIL HBP COLD & FLU PO) Take 15 mLs by mouth at bedtime as needed (congestion, sore throat, cold).    [provider]  gabapentin  (NEURONTIN ) 300 MG capsule Take 300 mg by mouth 3 (three) times daily. Patient not taking: Reported on 06/05/2023    [provider]  insulin  detemir (LEVEMIR ) 100 UNIT/ML injection Inject 50 Units into the skin 2 (two) times daily.    [provider]  isosorbide  mononitrate (IMDUR ) 60 MG 24 hr tablet Take 1 tablet (60 mg total) by mouth every morning. Patient not taking: Reported on 06/05/2023 05/04/23   Holly Paticia BROCKS, MD  NOVOLOG  FLEXPEN 100 UNIT/ML FlexPen Inject 20 Units into the skin 4 (four) times daily. 4-5 times daily 05/13/15   [provider]  sertraline  (ZOLOFT ) 100 MG tablet Take 100 mg by mouth daily.    [provider]  trazodone  (DESYREL ) 300 MG tablet Take 300 mg by mouth at bedtime. 03/03/23    [provider]    Scheduled Meds:  apixaban   5 mg Oral BID   atorvastatin   40 mg Oral q morning   carvedilol   12.5 mg Oral Q breakfast   insulin  aspart  0-5 Units Subcutaneous QHS   insulin  aspart  0-9 Units Subcutaneous TID WC   insulin  glargine  10 Units Subcutaneous QHS   isosorbide  mononitrate  60 mg Oral q morning   pantoprazole   40 mg Oral BID   Continuous Infusions:  lactated ringers  50 mL/hr at 05/06/24 0805   PRN Meds: acetaminophen  **OR** acetaminophen , HYDROmorphone  (DILAUDID ) injection, ondansetron  **OR** ondansetron  (ZOFRAN ) IV  Allergies:    Allergies  Allergen Reactions   Gelatin Swelling    No jello of any kind   Iodinated Contrast Media Anaphylaxis,  Hives, Itching and Swelling   Methylprednisolone  Sodium Succ Shortness Of Breath   Dicyclomine Hcl Anxiety and Other (See Comments)    shaky and sick GI upset, sick to stomach   Doxycycline Hives and Rash    rash   Glutamic Acid Itching and Swelling   Ketorolac Hives and Rash    Tolerated Toradol in the ED without side effect, hives, or complaint on 02/17/2022   Lisinopril  Swelling    Angioedema (facial, lip, or tongue swelling)   Metformin Nausea And Vomiting and Other (See Comments)    Dyspepsia sick to stomach It made me sick where I couldn't get out of the bed   Nitroglycerin  Nausea And Vomiting and Swelling    Pt states it makes her too sick   Nsaids Other (See Comments)    Stomach pain/bleeding   Other Itching, Swelling and Other (See Comments)    Solu-Medrol  Mix-O-Vial   Quetiapine Other (See Comments)   Vancomycin     Itching and erythema at infusion site within a few minutes of starting infusion. No systemic evidence of Red Man Syndrome   Blueberry Flavoring Agent (Non-Screening) Nausea And Vomiting and Rash   Cephalexin Nausea And Vomiting and Nausea Only    sick to stomach   Fish Allergy Rash   Ibuprofen Nausea And Vomiting    GI upset Reports stomach upset 2/2 hx gastric  polyps. Not true allergy.   Nitrofuran Derivatives Itching and Nausea And Vomiting    sick to stomach   Shellfish Allergy Itching, Swelling and Rash   Strawberry Flavoring Agent (Non-Screening) Nausea And Vomiting and Rash   Sulfa Antibiotics Nausea And Vomiting and Other (See Comments)    It just makes me real sick    Social History:   Social History   Socioeconomic History   Marital status: Married    Spouse name: Not on file   Number of children: 4   Years of education: Not on file   Highest education level: Not on file  Occupational History   Occupation: disabled  Tobacco Use   Smoking status: Passive Smoke Exposure - Never Smoker   Smokeless tobacco: Never  Vaping Use   Vaping status: Never Used  Substance and Sexual Activity   Alcohol use: No   Drug use: No   Sexual activity: Not Currently  Other Topics Concern   Not on file  Social History Narrative   Lives w/ husband, daughter, son-in-law & grandkids   Family History:    Family History  Problem Relation Age of Onset   Cirrhosis Mother 29       ?etiology   Cirrhosis Father        ?etiology   Cirrhosis Sister 30       ?etiology   Diverticulitis Sister      ROS:  Please see the history of present illness.   All other ROS reviewed and negative.     Physical Exam/Data: Vitals:   05/06/24 0645 05/06/24 0700 05/06/24 0756 05/06/24 0758  BP: 136/61 (!) 137/58 (!) 129/55   Pulse: 63 76 68   Resp: 11 16 16    Temp:    98 F (36.7 C)  TempSrc:    Oral  SpO2: 96% 100% 97%     Intake/Output Summary (Last 24 hours) at 05/06/2024 0904 Last data filed at 05/06/2024 0206 Gross per 24 hour  Intake 1043.65 ml  Output --  Net 1043.65 ml      06/05/2023    6:05 AM 06/04/2023  10:40 PM 05/28/2023    7:05 PM  Last 3 Weights  Weight (lbs) 231 lb 7.7 oz 244 lb 14.9 oz 245 lb  Weight (kg) 105 kg 111.1 kg 111.131 kg     There is no height or weight on file to calculate BMI.  General:  Well nourished, well  developed, in no acute distress HEENT: normal Neck: no JVD Vascular: No carotid bruits; Distal pulses 2+ bilaterally Cardiac:  normal S1, S2; RRR; no murmur Lungs:  clear to auscultation bilaterally, no wheezing, rhonchi or rales  Abd: soft, nontender, no hepatomegaly  Ext: no edema Musculoskeletal:  No deformities, BUE and BLE strength normal and equal Skin: warm and dry  Neuro:  CNs 2-12 intact, no focal abnormalities noted Psych:  Normal affect   EKG:  The EKG was personally reviewed and demonstrates: NSR, HR 69, LVH with no significant changes Telemetry:  Telemetry was personally reviewed and demonstrates:  NSR, HR 60-70's  Relevant CV Studies: ECHO pending   ECHO 04/2023 IMPRESSIONS   1. Left ventricular ejection fraction, by estimation, is 55 to 60%. The  left ventricle has normal function. The left ventricle has no regional  wall motion abnormalities. Left ventricular diastolic parameters are  consistent with Grade I diastolic  dysfunction (impaired relaxation).   2. Right ventricular systolic function is normal. The right ventricular  size is normal. Tricuspid regurgitation signal is inadequate for assessing  PA pressure.   3. The mitral valve is abnormal. Mild mitral valve regurgitation. No  evidence of mitral stenosis.   4. The aortic valve is tricuspid. Aortic valve regurgitation is not  visualized. No aortic stenosis is present.   5. Aortic dilatation noted. There is mild dilatation of the ascending  aorta, measuring 36 mm.   6. The inferior vena cava is normal in size with greater than 50%  respiratory variability, suggesting right atrial pressure of 3 mmHg.   ECHO Limited 04/2023 IMPRESSIONS   1. Limited Echo for IVC check.   2. Left ventricular ejection fraction, by estimation, is 60 to 65%. The  left ventricle has normal function. Left ventricular diastolic function  could not be evaluated.   3. Right ventricular systolic function is normal. The right  ventricular  size is normal. There is normal pulmonary artery systolic pressure.   4. The inferior vena cava is normal in size with <50% respiratory  variability, suggesting right atrial pressure of 8 mmHg.    RHC 04/2023   Hemodynamic findings consistent with mild pulmonary hypertension. Mild elevation of right heart pressures    Laboratory Data: High Sensitivity Troponin:   Recent Labs  Lab 05/05/24 1835 05/05/24 1936  TROPONINIHS 4 4     Chemistry Recent Labs  Lab 05/05/24 1835 05/06/24 0228  NA 136 136  K 4.9 4.6  CL 105 106  CO2 18* 19*  GLUCOSE 163* 126*  BUN 45* 45*  CREATININE 5.57* 5.33*  CALCIUM  8.7* 8.6*  MG  --  1.3*  GFRNONAA 8* 9*  ANIONGAP 13 11    Recent Labs  Lab 05/05/24 1936 05/06/24 0228  PROT 8.9* 8.1  ALBUMIN  3.3* 3.0*  AST 11* 32  ALT 11 22  ALKPHOS 50 71  BILITOT 0.7 0.5   Lipids No results for input(s): CHOL, TRIG, HDL, LABVLDL, LDLCALC, CHOLHDL in the last 168 hours.  Hematology Recent Labs  Lab 05/05/24 1927 05/06/24 0228  WBC 6.0 5.1  RBC 2.96* 2.62*  HGB 8.9* 7.9*  HCT 27.3* 23.8*  MCV 92.2 90.8  MCH 30.1 30.2  MCHC 32.6 33.2  RDW 16.0* 15.9*  PLT 200 171   Thyroid No results for input(s): TSH, FREET4 in the last 168 hours.  BNPNo results for input(s): BNP, PROBNP in the last 168 hours.  DDimer No results for input(s): DDIMER in the last 168 hours.  Radiology/Studies:  CT ABDOMEN PELVIS WO CONTRAST Result Date: 05/05/2024 CLINICAL DATA:  Abdominal pain EXAM: CT ABDOMEN AND PELVIS WITHOUT CONTRAST TECHNIQUE: Multidetector CT imaging of the abdomen and pelvis was performed following the standard protocol without IV contrast. RADIATION DOSE REDUCTION: This exam was performed according to the departmental dose-optimization program which includes automated exposure control, adjustment of the mA and/or kV according to patient size and/or use of iterative reconstruction technique. COMPARISON:  06/05/2023  FINDINGS: Lower chest: No acute findings. Coronary artery and aortic atherosclerosis. Hepatobiliary: No focal liver abnormality is seen. Status post cholecystectomy. No biliary dilatation. Pancreas: No focal abnormality or ductal dilatation. Spleen: No focal abnormality.  Normal size. Adrenals/Urinary Tract: Punctate nonobstructing bilateral renal stones. No ureteral stones or hydronephrosis. Adrenal glands and urinary bladder unremarkable. Stomach/Bowel: Stomach, large and small bowel grossly unremarkable. Vascular/Lymphatic: No evidence of aneurysm or adenopathy. Aortic atherosclerosis. Reproductive: Prior hysterectomy.  No adnexal masses. Other: No free fluid or free air. Musculoskeletal: No acute bony abnormality. IMPRESSION: No acute findings in the abdomen or pelvis. Punctate bilateral nephrolithiasis.  No hydronephrosis. Coronary artery disease, aortic atherosclerosis. Electronically Signed   By: Franky Crease M.D.   On: 05/05/2024 23:01   DG Chest 2 View Result Date: 05/05/2024 CLINICAL DATA:  Chest and abdominal pain. EXAM: CHEST - 2 VIEW COMPARISON:  06/05/2023 and CT chest 04/15/2023. FINDINGS: Trachea is midline. Heart size normal. Lungs are clear. No pleural fluid. Flowing anterior osteophytosis in the thoracic spine. IMPRESSION: No acute findings. Electronically Signed   By: Newell Eke M.D.   On: 05/05/2024 17:58     Assessment and Plan: Atypical Chest pain s/p RCA PCI in 2022 in [place] ECHO  04/22/2023: EF 60-65%, normal RV function, trivial TR. CAD risk factors: HL, HTN, 2 brothers with CABG, father with multiples MI's/HTN/HLD  Reports prior heart attack resulting in stent about 3 to 4 years ago in [place], [place] .  Notes this chest pain is similar to 2 prior episode. Reports intermittent CP described as sharp and like something sitting on chest, 8/10, lasting less than 5 minutes, radiated to the left shoulder. Associated with SOB, diaphoresis, nausea and dizziness.  Currently, no chest  pain. EKG: NSR, HR 69, LVH  Treated with IV morphine . ECHO pending.  Less concern for ischemic cause with negative troponins and no ischemic EKG changes.  Would not consider for cath at this time.  Patient is allergic to contrast and would need allergy prophylaxis for cath. Can reconsider based on further testing. Based on presentation and history of prior MI with similar pain, discussed stress test. Patient would not be able to walk on treadmill due to ongoing dizziness and weakness.  Patient would prefer Lexiscan .  Discussed with MD.  Order Lexiscan .  Order n.p.o. diet order.  Patient is not a candidate for CCTA given history of stent. Continue Coreg  12.5 mg twice daily, Imdur  60 mg daily.  Can consider increasing Imdur  if BP allows.  Near Syncope  Reports ongoing dizziness, weakness  and presyncope. Denies syncope.  Negative TN, EKG w/o ischemic changes ECHO pending  US  carotid pending  Encourage to increase daily water intake.  Chronic HFpEF ECHO  04/22/2023: EF 60-65%, normal  RV function, trivial TR. ECHO pending.  Reports SOB with minimal exertion for several months.  She sleeps with 2 pillows because it is easier to breathe.  Denies any edema.  Currently, no SOB.  No signs of volume overload.  No need for dialysis at this time. Continue Coreg  12.5 mg twice daily  AKI on CKD 5  Creatinine 5.57 (creatinine was 9.74 on 06/08/2023)  Avoid nephrotoxic agents/dehydration/hypotension  Awaiting nephrology consult.   HTN  BP 134/70 while in room.  Continue Coreg  12.5 mg twice daily, Imdur  60 mg daily Continue to follow BP.   HLD 04/2023 LDL 120 Continue Lipitor 40 mg daily  Paroxysmal atrial fibrillation No signs of recurrent A-fib on EKG/telemetry Continue Eliquis  5 mg twice daily  HX of DVT  Continue Eliquis  5 mg twice daily   Risk Assessment/Risk Scores:  TIMI Risk Score for Unstable Angina or Non-ST Elevation MI:   The patient's TIMI risk score is 2, which indicates a 8%  risk of all cause mortality, new or recurrent myocardial infarction or need for urgent revascularization in the next 14 days.{     For questions or updates, please contact [facility] Please consult [email or web address] for contact info under    Signed, Lorette CINDERELLA Kapur, PA-C  05/06/2024 9:04 AM

## 2024-05-06 NOTE — Hospital Course (Addendum)
 63 year old female with a history of CAD s/pDES-RCA 2022 ([place]) , hypertension, hyperlipidemia, T2DM, DVT on Eliquis , paroxysmal atrial fibrillation, bipolar disorder, CKD stage IV, and HFpEF presenting with chest pain and abdominal pain that began around 4 PM on 05/05/2024.  The patient was sitting on her couch watching television at the time.  She has not had any exertional chest pain.  She denies any fevers, chills, headache, neck pain, cough, hemoptysis.  She does have chronic dyspnea on exertion. She stated that she had some nausea and dry heaves on 05/05/2024, but this has improved.  She has not had any diarrhea, hematochezia, melena, dysuria, hematuria.  She denies any new medications.  She denies any NSAIDs.  She states that she was admitted to [facility] a little over a week prior to this admission.  She states that she only stayed in the hospital for 2 days, and she was treated for pneumonia and given 1 unit of PRBC.  In the ED, the patient was afebrile hemodynamically stable with oxygen  saturation 100% room air.  WBC 6.0, hemoglobin 8.9, platelets 200.  Sodium 136, potassium 4.9, bicarbonate 18, serum creatinine 5.57.  The patient was given 1 L of LR and morphine  4 mg x 1.  Troponin 4 >> 4.  EKG shows sinus rhythm with no concerning ST/T wave changes.  CT abdomen and pelvis was negative for any acute findings.  She was admitted for further evaluation and treatment of her acute kidney injury and chest pain.  The patient has had numerous hospitalizations. More recently, she was admitted to [facility] 12/13/2023 to 12/17/23.  At that time, the patient had hemoglobin of 6.0.  She underwent EGD which showed gastric polyps which were felt to be the source of her anemia.  She was transfused 2 units PRBC.  During this hospital admission, her serum creatinine was noted to be 3.30 on 12/17/23

## 2024-05-06 NOTE — Progress Notes (Addendum)
 PROGRESS NOTE  [name] FMW:[id number] DOB: 1960/10/29 DOA: 05/05/2024 PCP: System, Provider Not In  Brief History:  63 year old female with a history of CAD s/pDES-RCA 2022 ([place]) , hypertension, hyperlipidemia, T2DM, DVT on Eliquis , paroxysmal atrial fibrillation, bipolar disorder, CKD stage IV, and HFpEF presenting with chest pain and abdominal pain that began around 4 PM on 05/05/2024.  The patient was sitting on her couch watching television at the time.  She has not had any exertional chest pain.  She denies any fevers, chills, headache, neck pain, cough, hemoptysis.  She does have chronic dyspnea on exertion. She stated that she had some nausea and dry heaves on 05/05/2024, but this has improved.  She has not had any diarrhea, hematochezia, melena, dysuria, hematuria.  She denies any new medications.  She denies any NSAIDs.  She states that she was admitted to [facility] a little over a week prior to this admission.  She states that she only stayed in the hospital for 2 days, and she was treated for pneumonia and given 1 unit of PRBC.  In the ED, the patient was afebrile hemodynamically stable with oxygen  saturation 100% room air.  WBC 6.0, hemoglobin 8.9, platelets 200.  Sodium 136, potassium 4.9, bicarbonate 18, serum creatinine 5.57.  The patient was given 1 L of LR and morphine  4 mg x 1.  Troponin 4 >> 4.  EKG shows sinus rhythm with no concerning ST/T wave changes.  CT abdomen and pelvis was negative for any acute findings.  She was admitted for further evaluation and treatment of her acute kidney injury and chest pain.  The patient has had numerous hospitalizations. More recently, she was admitted to [facility] 12/13/2023 to 12/17/23.  At that time, the patient had hemoglobin of 6.0.  She underwent EGD which showed gastric polyps which were felt to be the source of her anemia.  She was transfused 2 units PRBC.      Assessment/Plan: Acute on chronic renal  failure--CKD stage IV - Baseline creatinine 3.0-3.3 - Renal ultrasound--negative hydronephrosis - Nephrology consult - Start IV fluids judiciously  Chest pain - Troponin 4>> 4 - 04/22/2023 echo EF 60 to 65%, normal RVF, trivial TR - Repeat echo  Paroxysmal atrial fibrillation - Currently in sinus rhythm - Continue apixaban  - Continue carvedilol   Chronic HFpEF - Clinically compensated - 04/22/2023 echo EF 60 to 65%, normal RVF, trivial TR  Coronary artery disease - Status post DES to the RCA 2022 in [place]  -Patient had unstable angina 03/2023--LHC was deferred at that time secondary to her poor renal function - Continue carvedilol , Lipitor, Plavix , Imdur   Anemia of CKD - Check iron studies - Baseline hemoglobin 8-9  Essential hypertension - Continue carvedilol  - Holding amlodipine  temporarily  Uncontrolled diabetes mellitus type 2 with hyperglycemia - Start reduced dose Semglee  - Recheck hemoglobin A1c - NovoLog  sliding scale  Chronic constipation  - pt reports having diabetic neuropathy and chronic problems with constipation  - pt has been on regular laxatives and has had a bowel movement   Bipolar disorder, NOS - Continue home trazodone , lamictal , sertraline  --Review of the medical record shows that she is no longer taking these medications even as of her recent hospitalization at [facility]           Family Communication: no  Family at bedside  Consultants:  renal, cardiology  Code Status:  FULL   DVT Prophylaxis:  apixaban    Procedures:  As Listed in Progress Note Above  Antibiotics: None    Subjective: Patient denies fevers, chills, headache, chest pain, dyspnea, nausea, vomiting, diarrhea, abdominal pain, dysuria, hematuria, hematochezia, and melena.  Objective: Vitals:   05/06/24 0600 05/06/24 0615 05/06/24 0630 05/06/24 0645  BP: (!) 124/57 (!) 150/55 135/65 136/61  Pulse: 62 71 69 63  Resp: 12 12 13 11   Temp:       TempSrc:      SpO2: 96% 93% 93% 96%    Intake/Output Summary (Last 24 hours) at 05/06/2024 9277 Last data filed at 05/06/2024 0206 Gross per 24 hour  Intake 1043.65 ml  Output --  Net 1043.65 ml   Weight change:  Exam:  General:  Pt is alert, follows commands appropriately, not in acute distress HEENT: No icterus, No thrush, No neck mass, [place]/AT Cardiovascular: RRR, S1/S2, no rubs, no gallops Respiratory: CTA bilaterally, no wheezing, no crackles, no rhonchi Abdomen: Soft/+BS, non tender, non distended, no guarding Extremities: No edema, No lymphangitis, No petechiae, No rashes, no synovitis   Data Reviewed: I have personally reviewed following labs and imaging studies Basic Metabolic Panel: Recent Labs  Lab 05/05/24 1835 05/06/24 0228  NA 136 136  K 4.9 4.6  CL 105 106  CO2 18* 19*  GLUCOSE 163* 126*  BUN 45* 45*  CREATININE 5.57* 5.33*  CALCIUM  8.7* 8.6*  MG  --  1.3*  PHOS  --  5.4*   Liver Function Tests: Recent Labs  Lab 05/05/24 1936 05/06/24 0228  AST 11* 32  ALT 11 22  ALKPHOS 50 71  BILITOT 0.7 0.5  PROT 8.9* 8.1  ALBUMIN  3.3* 3.0*   Recent Labs  Lab 05/05/24 1936  LIPASE 44   No results for input(s): AMMONIA in the last 168 hours. Coagulation Profile: No results for input(s): INR, PROTIME in the last 168 hours. CBC: Recent Labs  Lab 05/05/24 1927 05/06/24 0228  WBC 6.0 5.1  HGB 8.9* 7.9*  HCT 27.3* 23.8*  MCV 92.2 90.8  PLT 200 171   Cardiac Enzymes: No results for input(s): CKTOTAL, CKMB, CKMBINDEX, TROPONINI in the last 168 hours. BNP: Invalid input(s): POCBNP CBG: No results for input(s): GLUCAP in the last 168 hours. HbA1C: No results for input(s): HGBA1C in the last 72 hours. Urine analysis:    Component Value Date/Time   COLORURINE YELLOW 05/05/2024 2203   APPEARANCEUR CLOUDY (A) 05/05/2024 2203   LABSPEC 1.012 05/05/2024 2203   PHURINE 5.0 05/05/2024 2203   GLUCOSEU NEGATIVE 05/05/2024 2203    HGBUR SMALL (A) 05/05/2024 2203   BILIRUBINUR NEGATIVE 05/05/2024 2203   KETONESUR NEGATIVE 05/05/2024 2203   PROTEINUR 100 (A) 05/05/2024 2203   UROBILINOGEN 0.2 12/17/2011 2258   NITRITE NEGATIVE 05/05/2024 2203   LEUKOCYTESUR SMALL (A) 05/05/2024 2203   Sepsis Labs: @LABRCNTIP (procalcitonin:4,lacticidven:4) )No results found for this or any previous visit (from the past 240 hours).   Scheduled Meds:  insulin  aspart  0-5 Units Subcutaneous QHS   insulin  aspart  0-9 Units Subcutaneous TID WC   insulin  glargine  10 Units Subcutaneous QHS   pantoprazole   40 mg Oral BID   Continuous Infusions:  Procedures/Studies: CT ABDOMEN PELVIS WO CONTRAST Result Date: 05/05/2024 CLINICAL DATA:  Abdominal pain EXAM: CT ABDOMEN AND PELVIS WITHOUT CONTRAST TECHNIQUE: Multidetector CT imaging of the abdomen and pelvis was performed following the standard protocol without IV contrast. RADIATION DOSE REDUCTION: This exam was performed according to the departmental dose-optimization program which includes automated exposure control, adjustment of the mA  and/or kV according to patient size and/or use of iterative reconstruction technique. COMPARISON:  06/05/2023 FINDINGS: Lower chest: No acute findings. Coronary artery and aortic atherosclerosis. Hepatobiliary: No focal liver abnormality is seen. Status post cholecystectomy. No biliary dilatation. Pancreas: No focal abnormality or ductal dilatation. Spleen: No focal abnormality.  Normal size. Adrenals/Urinary Tract: Punctate nonobstructing bilateral renal stones. No ureteral stones or hydronephrosis. Adrenal glands and urinary bladder unremarkable. Stomach/Bowel: Stomach, large and small bowel grossly unremarkable. Vascular/Lymphatic: No evidence of aneurysm or adenopathy. Aortic atherosclerosis. Reproductive: Prior hysterectomy.  No adnexal masses. Other: No free fluid or free air. Musculoskeletal: No acute bony abnormality. IMPRESSION: No acute findings in the  abdomen or pelvis. Punctate bilateral nephrolithiasis.  No hydronephrosis. Coronary artery disease, aortic atherosclerosis. Electronically Signed   By: Franky Crease M.D.   On: 05/05/2024 23:01   DG Chest 2 View Result Date: 05/05/2024 CLINICAL DATA:  Chest and abdominal pain. EXAM: CHEST - 2 VIEW COMPARISON:  06/05/2023 and CT chest 04/15/2023. FINDINGS: Trachea is midline. Heart size normal. Lungs are clear. No pleural fluid. Flowing anterior osteophytosis in the thoracic spine. IMPRESSION: No acute findings. Electronically Signed   By: Newell Eke M.D.   On: 05/05/2024 17:58    Alm Schneider, DO  [facility]  If 7PM-7AM, please contact night-coverage [email or web address] Password [email or web address] 05/06/2024, 7:22 AM   LOS: 1 day

## 2024-05-06 NOTE — Consult Note (Signed)
 Reason for Consult:  AKI/CKD stage IV Referring Physician:  Tat, MD  [name] is an 63 y.o. female with a PMH significant for DM, HTN, HLD, CAD, h/o DVT on Eliquis , P A fib, h/o GIB due to gastric polyps, bipolar disorder, HFpEF, and h/o AKI/CKD stage IV who presented to [facility] ED on 05/05/24 c/o chest and abdominal pain.  She did have N/V prior to presentation.  She did have a recent admission at [facility] with pneumonia.  In the ED, Temp 98.9, Bp 157/70, HR 72, SpO2 99% on room air.  Labs notable for WBC 6, Hgb 8.9, Co2 18, BUN 45, Cr 5.57, gluc 163, Ca 8.7, alb 3.3, troponin I 4.  ECG with NRS no ST/twave changes.  CT of abd/pelvis without acute findings.  We were consulted due to recurrent AKI/CKD Stage IV. The trend in Scr is seen below.   She was seen by our practice in September 2024 when she presented with AKI/CKD stage IV in setting of volume depletion and covid-19 infection.  Scr continued to climb to 9.74, however she left AMA because she was afraid of dialysis.  Next known Scr was 2.9 on 12/13/23 and then rose to 3.2 in April.  She had been followed by Nephrology in [place] but had not seen them in over a year due to insurance issues.  She was hospitalized last week and was seen by Nephrology who set up follow up with them in September.  She thinks her Scr was in the 3's at that time but is not sure.  No recent labs available for review.  She does not take NSAIDs or Cox-II I's.  She had some N/V prior to admission and admits that she has not been eating or drinking much over the past week since her discharge.     Trend in Creatinine: Creatinine, Ser  Date/Time Value Ref Range Status  05/06/2024 02:28 AM 5.33 (H) 0.44 - 1.00 mg/dL Final  91/79/7974 93:64 PM 5.57 (H) 0.44 - 1.00 mg/dL Final  95/97/7974 3.2 (H)    12/13/2023 2.9 (H)    06/08/2023 05:05 AM 9.74 (H) 0.44 - 1.00 mg/dL Final  90/78/7975 95:67 AM 9.00 (H) 0.44 - 1.00 mg/dL Final  90/79/7975 95:49 AM 7.75 (H) 0.44 - 1.00 mg/dL Final   90/80/7975 87:70 PM 6.54 (H) 0.44 - 1.00 mg/dL Final  90/81/7975 88:45 PM 5.96 (H) 0.44 - 1.00 mg/dL Final  90/88/7975 92:61 PM 1.92 (H) 0.44 - 1.00 mg/dL Final  91/82/7975 98:58 AM 2.30 (H) 0.44 - 1.00 mg/dL Final  91/83/7975 96:72 AM 2.42 (H) 0.44 - 1.00 mg/dL Final  91/84/7975 92:75 AM 2.65 (H) 0.44 - 1.00 mg/dL Final  91/85/7975 87:62 AM 2.49 (H) 0.44 - 1.00 mg/dL Final  91/86/7975 98:96 AM 2.76 (H) 0.44 - 1.00 mg/dL Final  91/87/7975 87:59 AM 2.78 (H) 0.44 - 1.00 mg/dL Final  91/88/7975 98:81 AM 2.75 (H) 0.44 - 1.00 mg/dL Final  91/89/7975 97:61 AM 2.62 (H) 0.44 - 1.00 mg/dL Final  91/90/7975 92:87 AM 2.52 (H) 0.44 - 1.00 mg/dL Final  91/91/7975 95:52 AM 2.56 (H) 0.44 - 1.00 mg/dL Final  91/92/7975 95:60 AM 2.53 (H) 0.44 - 1.00 mg/dL Final  91/93/7975 95:67 AM 2.33 (H) 0.44 - 1.00 mg/dL Final  91/94/7975 95:50 AM 2.25 (H) 0.44 - 1.00 mg/dL Final  91/95/7975 95:44 AM 2.17 (H) 0.44 - 1.00 mg/dL Final  91/96/7975 95:51 AM 2.07 (H) 0.44 - 1.00 mg/dL Final  91/97/7975 95:61 AM 1.96 (H) 0.44 - 1.00 mg/dL  Final  04/17/2023 06:36 AM 1.85 (H) 0.44 - 1.00 mg/dL Final  92/68/7975 93:47 AM 1.89 (H) 0.44 - 1.00 mg/dL Final  92/69/7975 90:41 PM 1.94 (H) 0.44 - 1.00 mg/dL Final  92/85/7975 94:42 PM 1.49 (H) 0.44 - 1.00 mg/dL Final  94/74/7975 96:66 PM 1.41 (H) 0.44 - 1.00 mg/dL Final  94/92/7975 87:74 PM 1.27 (H) 0.44 - 1.00 mg/dL Final  94/95/7975 95:90 PM 1.24 (H) 0.44 - 1.00 mg/dL Final  97/74/7975 98:92 AM 1.15 (H) 0.44 - 1.00 mg/dL Final  97/85/7975 96:59 AM 1.13 (H) 0.44 - 1.00 mg/dL Final  98/78/7975 94:66 PM 1.16 (H) 0.44 - 1.00 mg/dL Final  90/87/7980 90:80 PM 0.96 0.44 - 1.00 mg/dL Final  96/95/7980 96:61 PM 0.94 0.44 - 1.00 mg/dL Final  89/70/7981 91:78 PM 0.98 0.44 - 1.00 mg/dL Final  88/84/7983 96:79 PM 0.95 0.44 - 1.00 mg/dL Final  90/96/7983 92:59 PM 1.44 (H) 0.44 - 1.00 mg/dL Final  92/76/7986 96:64 AM 0.86 0.50 - 1.10 mg/dL Final  92/76/7986 97:49 AM 1.00 0.50 - 1.10 mg/dL  Final  95/90/7986 87:40 AM 0.90 0.50 - 1.10 mg/dL Final  95/97/7986 89:62 PM 0.75 0.50 - 1.10 mg/dL Final  96/86/7986 87:84 AM 0.81 0.50 - 1.10 mg/dL Final  93/96/7987 98:54 PM 0.73 0.4 - 1.2 mg/dL Final  94/78/7987 91:41 PM 0.67 0.4 - 1.2 mg/dL Final  90/74/7988 87:92 PM 0.92 0.4 - 1.2 mg/dL Final  93/86/7990 98:94 PM 0.82  Final  12/04/2007 10:45 PM 0.8  Final  12/04/2007 10:40 PM 0.84  Final  03/17/2007 05:12 PM 0.87  Final    PMH:   Past Medical History:  Diagnosis Date   Asthma    Atrial fibrillation (HCC)    Bipolar 1 disorder (HCC)    Chronic back pain    Chronic chest pain    Diabetes mellitus without complication (HCC)    Gastroparesis    GERD (gastroesophageal reflux disease)    Hyperlipemia    Hypertension    IBS (irritable bowel syndrome)    Normal cardiac stress test 02/2014   [facility]    PSH:   Past Surgical History:  Procedure Laterality Date   ABDOMINAL HYSTERECTOMY  2001   APPENDECTOMY  10/2005   CHOLECYSTECTOMY  10/2005   COLONOSCOPY  2007   Patel ([name]pt reports hemorrhoids   ESOPHAGOGASTRODUODENOSCOPY  02/28/2006   [name], normal on daily PPI, myultiple hyperplastic polyps   MOUTH SURGERY     RIGHT HEART CATH N/A 04/25/2023   Procedure: RIGHT HEART CATH;  Surgeon: Verlin Lonni BIRCH, MD;  Location: [facility] INVASIVE CV LAB;  Service: Cardiovascular;  Laterality: N/A;   RIGHT OOPHORECTOMY  1999    Allergies:  Allergies  Allergen Reactions   Gelatin Swelling    No jello of any kind   Iodinated Contrast Media Anaphylaxis, Hives, Itching and Swelling   Methylprednisolone  Sodium Succ Shortness Of Breath   Dicyclomine Hcl Anxiety and Other (See Comments)    shaky and sick GI upset, sick to stomach   Doxycycline Hives and Rash    rash   Glutamic Acid Itching and Swelling   Ketorolac Hives and Rash    Tolerated Toradol in the ED without side effect, hives, or complaint on 02/17/2022   Lisinopril  Swelling    Angioedema (facial,  lip, or tongue swelling)   Metformin Nausea And Vomiting and Other (See Comments)    Dyspepsia sick to stomach It made me sick where I couldn't get out of the bed   Nitroglycerin  Nausea  And Vomiting and Swelling    Pt states it makes her too sick   Nsaids Other (See Comments)    Stomach pain/bleeding   Other Itching, Swelling and Other (See Comments)    Solu-Medrol  Mix-O-Vial   Quetiapine Other (See Comments)   Vancomycin     Itching and erythema at infusion site within a few minutes of starting infusion. No systemic evidence of Red Man Syndrome   Blueberry Flavoring Agent (Non-Screening) Nausea And Vomiting and Rash   Cephalexin Nausea And Vomiting and Nausea Only    sick to stomach   Fish Allergy Rash   Ibuprofen Nausea And Vomiting    GI upset Reports stomach upset 2/2 hx gastric polyps. Not true allergy.   Nitrofuran Derivatives Itching and Nausea And Vomiting    sick to stomach   Shellfish Allergy Itching, Swelling and Rash   Strawberry Flavoring Agent (Non-Screening) Nausea And Vomiting and Rash   Sulfa Antibiotics Nausea And Vomiting and Other (See Comments)    It just makes me real sick    Medications:   Prior to Admission medications   Medication Sig Start Date End Date Taking? Authorizing Provider  albuterol  (VENTOLIN  HFA) 108 (90 Base) MCG/ACT inhaler Inhale 2 puffs into the lungs every 6 (six) hours as needed for wheezing or shortness of breath.   Yes [provider]  amLODipine  (NORVASC ) 10 MG tablet Take 10 mg by mouth daily.   Yes [provider]  apixaban  (ELIQUIS ) 5 MG TABS tablet Take 1 tablet (5 mg total) by mouth 2 (two) times daily. 11/17/17  Yes Suzette Pac, MD  atorvastatin  (LIPITOR) 40 MG tablet Take 40 mg by mouth every morning.   Yes [provider]  carvedilol  (COREG ) 25 MG tablet Take 0.5 tablets (12.5 mg total) by mouth 2 (two) times daily with a meal. Patient taking differently: Take 12.5 mg by mouth daily with  breakfast. 05/03/23  Yes Tobie Paticia BROCKS, MD  clopidogrel  (PLAVIX ) 75 MG tablet Take 75 mg by mouth daily.   Yes [provider]  famotidine  (PEPCID ) 20 MG tablet Take 20 mg by mouth 2 (two) times daily.   Yes [provider]  gabapentin  (NEURONTIN ) 300 MG capsule Take 300 mg by mouth 3 (three) times daily.   Yes [provider]  lamoTRIgine  (LAMICTAL ) 100 MG tablet Take 100 mg by mouth 2 (two) times daily.   Yes [provider]  LANTUS  SOLOSTAR 100 UNIT/ML Solostar Pen Inject 50 Units into the skin 2 (two) times daily.   Yes [provider]  NOVOLOG  FLEXPEN 100 UNIT/ML FlexPen Inject 20 Units into the skin 4 (four) times daily. 4-5 times daily 05/13/15  Yes [provider]  pantoprazole  (PROTONIX ) 40 MG tablet Take 40 mg by mouth 2 (two) times daily before a meal.   Yes [provider]  promethazine  (PHENERGAN ) 25 MG tablet Take 25 mg by mouth every 6 (six) hours as needed for nausea or vomiting.   Yes [provider]  sertraline  (ZOLOFT ) 100 MG tablet Take 100 mg by mouth daily.   Yes [provider]  SYMBICORT 160-4.5 MCG/ACT inhaler Inhale 2 puffs into the lungs daily at 6 (six) AM. 02/03/24  Yes [provider]  clonazePAM  (KLONOPIN ) 1 MG tablet Take 1 mg by mouth 2 (two) times daily. Patient not taking: Reported on 05/06/2024    [provider]  isosorbide  mononitrate (IMDUR ) 60 MG 24 hr tablet Take 1 tablet (60 mg total) by mouth every morning.  Patient not taking: Reported on 06/05/2023 05/04/23   Tobie Paticia BROCKS, MD    Inpatient medications:  apixaban   5 mg Oral BID   atorvastatin   40 mg Oral q morning   carvedilol   12.5 mg Oral Q breakfast   insulin  aspart  0-5 Units Subcutaneous QHS   insulin  aspart  0-9 Units Subcutaneous TID WC   insulin  glargine  10 Units Subcutaneous QHS   isosorbide  mononitrate  60 mg Oral q morning   pantoprazole   40 mg Oral BID    Discontinued Meds:   Medications  Discontinued During This Encounter  Medication Reason   acetaminophen  (TYLENOL ) 325 MG tablet Patient Preference   diphenhydrAMINE  (BENADRYL ) 25 MG tablet Patient Preference   trazodone  (DESYREL ) 300 MG tablet Patient Preference   DM-Doxylamine-Acetaminophen  (NYQUIL HBP COLD & FLU PO) Patient Preference   insulin  detemir (LEVEMIR ) 100 UNIT/ML injection Change in therapy    Social History:  reports that she is a non-smoker but has been exposed to tobacco smoke. She has never used smokeless tobacco. She reports that she does not drink alcohol and does not use drugs.  Family History:   Family History  Problem Relation Age of Onset   Cirrhosis Mother 77       ?etiology   Cirrhosis Father        ?etiology   Cirrhosis Sister 30       ?etiology   Diverticulitis Sister     Pertinent items are noted in HPI. Weight change:   Intake/Output Summary (Last 24 hours) at 05/06/2024 0914 Last data filed at 05/06/2024 0206 Gross per 24 hour  Intake 1043.65 ml  Output --  Net 1043.65 ml   BP (!) 134/52   Pulse 67   Temp 98 F (36.7 C) (Oral)   Resp 17   SpO2 99%  Vitals:   05/06/24 0700 05/06/24 0756 05/06/24 0758 05/06/24 0900  BP: (!) 137/58 (!) 129/55  (!) 134/52  Pulse: 76 68  67  Resp: 16 16  17   Temp:   98 F (36.7 C)   TempSrc:   Oral   SpO2: 100% 97%  99%     General appearance: cooperative, fatigued, and no distress Head: Normocephalic, without obvious abnormality, atraumatic Resp: clear to auscultation bilaterally Cardio: regular rate and rhythm, S1, S2 normal, no murmur, click, rub or gallop GI: soft, non-tender; bowel sounds normal; no masses,  no organomegaly Extremities: extremities normal, atraumatic, no cyanosis or edema  Labs: Basic Metabolic Panel: Recent Labs  Lab 05/05/24 1835 05/05/24 1936 05/06/24 0228  NA 136  --  136  K 4.9  --  4.6  CL 105  --  106  CO2 18*  --  19*  GLUCOSE 163*  --  126*  BUN 45*  --  45*  CREATININE 5.57*  --  5.33*   ALBUMIN   --  3.3* 3.0*  CALCIUM  8.7*  --  8.6*  PHOS  --   --  5.4*   Liver Function Tests: Recent Labs  Lab 05/05/24 1936 05/06/24 0228  AST 11* 32  ALT 11 22  ALKPHOS 50 71  BILITOT 0.7 0.5  PROT 8.9* 8.1  ALBUMIN  3.3* 3.0*   Recent Labs  Lab 05/05/24 1936  LIPASE 44   No results for input(s): AMMONIA in the last 168 hours. CBC: Recent Labs  Lab 05/05/24 1927 05/06/24 0228  WBC 6.0 5.1  HGB 8.9* 7.9*  HCT 27.3* 23.8*  MCV 92.2 90.8  PLT 200 171  PT/INR: @LABRCNTIP (inr:5) Cardiac Enzymes: )No results for input(s): CKTOTAL, CKMB, CKMBINDEX, TROPONINI in the last 168 hours. CBG: Recent Labs  Lab 05/06/24 0808  GLUCAP 122*    Iron Studies: No results for input(s): IRON, TIBC, TRANSFERRIN, FERRITIN in the last 168 hours.  Xrays/Other Studies: US  Carotid Bilateral Result Date: 05/06/2024 CLINICAL DATA:  Hypertension.  Syncope.  Hyperlipidemia.  Diabetes. EXAM: BILATERAL CAROTID DUPLEX ULTRASOUND TECHNIQUE: Elnor scale imaging, color Doppler and duplex ultrasound were performed of bilateral carotid and vertebral arteries in the neck. COMPARISON:  01/06/2006 FINDINGS: Criteria: Quantification of carotid stenosis is based on velocity parameters that correlate the residual internal carotid diameter with NASCET-based stenosis levels, using the diameter of the distal internal carotid lumen as the denominator for stenosis measurement. The following velocity measurements were obtained: RIGHT ICA: 109/27 cm/sec CCA: 97/18 cm/sec SYSTOLIC ICA/CCA RATIO:  1.1 ECA: 336 cm/sec LEFT ICA: 219/31 cm/sec CCA: 137/18 cm/sec SYSTOLIC ICA/CCA RATIO:  1.6 ECA: 170 cm/sec RIGHT CAROTID ARTERY: Mild calcified atheromatous plaque of the RIGHT carotid bulb. RIGHT VERTEBRAL ARTERY:  Antegrade flow. LEFT CAROTID ARTERY: Moderate long segment calcified plaque of the distal LEFT common carotid artery extending into the proximal internal carotid artery. LEFT VERTEBRAL ARTERY:   Antegrade flow. IMPRESSION: 1. Moderate long segment calcified plaque of the distal LEFT common carotid artery extending into the proximal internal carotid artery. Doppler measurements indicative of 50-69% stenosis of the LEFT internal carotid artery. 2. Mild calcified plaque of the RIGHT carotid bulb without significant stenosis. Electronically Signed   By: Aliene Lloyd M.D.   On: 05/06/2024 10:43   US  RENAL Result Date: 05/06/2024 CLINICAL DATA:  Acute on chronic renal failure. EXAM: RENAL / URINARY TRACT ULTRASOUND COMPLETE COMPARISON:  June 06, 2023. FINDINGS: Right Kidney: Renal measurements: 9.3 x 4.3 x 4.1 cm = volume: 88 mL. Echogenicity within normal limits. No mass or hydronephrosis visualized. Left Kidney: Renal measurements: 10.3 x 5.0 x 4.5 cm = volume: 123 mL. Echogenicity within normal limits. No mass or hydronephrosis visualized. Bladder: Appears normal for degree of bladder distention. Calculated prevoid volume of 150 mL. Other: None. Electronically Signed   By: Lynwood Landy Raddle M.D.   On: 05/06/2024 10:04   CT ABDOMEN PELVIS WO CONTRAST Result Date: 05/05/2024 CLINICAL DATA:  Abdominal pain EXAM: CT ABDOMEN AND PELVIS WITHOUT CONTRAST TECHNIQUE: Multidetector CT imaging of the abdomen and pelvis was performed following the standard protocol without IV contrast. RADIATION DOSE REDUCTION: This exam was performed according to the departmental dose-optimization program which includes automated exposure control, adjustment of the mA and/or kV according to patient size and/or use of iterative reconstruction technique. COMPARISON:  06/05/2023 FINDINGS: Lower chest: No acute findings. Coronary artery and aortic atherosclerosis. Hepatobiliary: No focal liver abnormality is seen. Status post cholecystectomy. No biliary dilatation. Pancreas: No focal abnormality or ductal dilatation. Spleen: No focal abnormality.  Normal size. Adrenals/Urinary Tract: Punctate nonobstructing bilateral renal stones.  No ureteral stones or hydronephrosis. Adrenal glands and urinary bladder unremarkable. Stomach/Bowel: Stomach, large and small bowel grossly unremarkable. Vascular/Lymphatic: No evidence of aneurysm or adenopathy. Aortic atherosclerosis. Reproductive: Prior hysterectomy.  No adnexal masses. Other: No free fluid or free air. Musculoskeletal: No acute bony abnormality. IMPRESSION: No acute findings in the abdomen or pelvis. Punctate bilateral nephrolithiasis.  No hydronephrosis. Coronary artery disease, aortic atherosclerosis. Electronically Signed   By: Franky Crease M.D.   On: 05/05/2024 23:01   DG Chest 2 View Result Date: 05/05/2024 CLINICAL DATA:  Chest and abdominal pain. EXAM: CHEST - 2  VIEW COMPARISON:  06/05/2023 and CT chest 04/15/2023. FINDINGS: Trachea is midline. Heart size normal. Lungs are clear. No pleural fluid. Flowing anterior osteophytosis in the thoracic spine. IMPRESSION: No acute findings. Electronically Signed   By: Newell Eke M.D.   On: 05/05/2024 17:58     Assessment/Plan:  AKI/CKD stage IV vs progressive CKD Stage V - Underlying CKD stage IV due to longstanding, poorly controlled DM and HTN.  Had severe AKI in September last year with Scr of 9.75 and did not follow up until hospitalized in April 2025 and had Scr of 3.2 at that time.  No labs since for review although she reports that it was around that same number during her last admission a week ago and was set up to follow up with nephrology in [place] on 05/31/24.  This episode of AKI/CKD stage IV is presumably due to ischemic ATN in setting of volume depletion and relative hypotension.  Scr improving slightly with IVF's.  CT scan without obstruction.  Renal US  without increased echogenicity.  Unclear if this is AKI/CKD or progressive CKD. Currently without urgent indication for HD and has had significantly higher BUN/Cr in the past that did not require RRT.  We did discuss that she has advanced CKD and will need to continue  to follow closely with Nephrology after discharge.  Will order serologies for completeness as this does not appear to have been performed during her admission last year.   Avoid nephrotoxic medications including NSAIDs and iodinated intravenous contrast exposure unless the latter is absolutely indicated.   Preferred narcotic agents for pain control are hydromorphone , fentanyl , and methadone. Morphine  should not be used.  Avoid Baclofen and avoid oral sodium phosphate and magnesium  citrate based laxatives / bowel preps.  Continue strict Input and Output monitoring. Will monitor the patient closely with you and intervene or adjust therapy as indicated by changes in clinical status/labs   Chest pain - ECG and troponin normal.  ECHO pending. P. Atrial fibrillation - continue with carvedilol  and Eliquis  HFpEF - appears hypovolemic on exam, continue with IVF's and follow. CAD - stable Anemia of CKD stage IV - TSAT 32%.  Will start ESA and transfuse for Hgb <7. DM type 2 - per primary Bipolar disorder - per primary   Fairy DELENA Sellar 05/06/2024, 9:14 AM

## 2024-05-06 NOTE — TOC CM/SW Note (Signed)
 Transition of Care [facility]) - Inpatient Brief Assessment   Patient Details  Name: [name] MRN: [id number] Date of Birth: Feb 14, 1961  [facility] of [facility] CM/SW Contact:    Lucie Lunger, LCSWA Phone Number: 05/06/2024, 9:57 AM   Clinical Narrative: Transition of Care Department [facility]) has reviewed patient and no TOC needs have been identified at this time. We will continue to monitor patient advancement through interdiciplinary progression rounds. If new patient transition needs arise, please place a TOC consult.  Transition of Care Asessment: Insurance and Status: Insurance coverage has been reviewed Patient has primary care physician: Yes (provider not in system, PCP list added as additional resource) Home environment has been reviewed: From home Prior level of function:: Inependent Prior/Current Home Services: No current home services Social Drivers of Health Review: SDOH reviewed no interventions necessary Readmission risk has been reviewed: Yes Transition of care needs: no transition of care needs at this time

## 2024-05-07 ENCOUNTER — Encounter (HOSPITAL_COMMUNITY): Payer: Self-pay | Admitting: Internal Medicine

## 2024-05-07 ENCOUNTER — Inpatient Hospital Stay (HOSPITAL_COMMUNITY)

## 2024-05-07 DIAGNOSIS — R0789 Other chest pain: Secondary | ICD-10-CM | POA: Diagnosis not present

## 2024-05-07 DIAGNOSIS — R079 Chest pain, unspecified: Secondary | ICD-10-CM | POA: Diagnosis not present

## 2024-05-07 DIAGNOSIS — R42 Dizziness and giddiness: Secondary | ICD-10-CM

## 2024-05-07 DIAGNOSIS — Z7901 Long term (current) use of anticoagulants: Secondary | ICD-10-CM | POA: Diagnosis not present

## 2024-05-07 DIAGNOSIS — I2510 Atherosclerotic heart disease of native coronary artery without angina pectoris: Secondary | ICD-10-CM | POA: Diagnosis not present

## 2024-05-07 DIAGNOSIS — N179 Acute kidney failure, unspecified: Secondary | ICD-10-CM | POA: Diagnosis not present

## 2024-05-07 DIAGNOSIS — I5032 Chronic diastolic (congestive) heart failure: Secondary | ICD-10-CM | POA: Diagnosis not present

## 2024-05-07 DIAGNOSIS — N184 Chronic kidney disease, stage 4 (severe): Secondary | ICD-10-CM | POA: Diagnosis not present

## 2024-05-07 DIAGNOSIS — I480 Paroxysmal atrial fibrillation: Secondary | ICD-10-CM | POA: Diagnosis not present

## 2024-05-07 LAB — CBC
HCT: 23.9 % — ABNORMAL LOW (ref 36.0–46.0)
Hemoglobin: 7.6 g/dL — ABNORMAL LOW (ref 12.0–15.0)
MCH: 29.9 pg (ref 26.0–34.0)
MCHC: 31.8 g/dL (ref 30.0–36.0)
MCV: 94.1 fL (ref 80.0–100.0)
Platelets: 166 K/uL (ref 150–400)
RBC: 2.54 MIL/uL — ABNORMAL LOW (ref 3.87–5.11)
RDW: 16.3 % — ABNORMAL HIGH (ref 11.5–15.5)
WBC: 4.5 K/uL (ref 4.0–10.5)
nRBC: 0 % (ref 0.0–0.2)

## 2024-05-07 LAB — EXTRACTABLE NUCLEAR ANTIGEN ANTIBODY
ENA SM Ab Ser-aCnc: <0.2 AI (ref 0.0–0.9)
Ribonucleic Protein: <0.2 AI (ref 0.0–0.9)
SSA (Ro) (ENA) Antibody, IgG: <0.2 AI (ref 0.0–0.9)
SSB (La) (ENA) Antibody, IgG: <0.2 AI (ref 0.0–0.9)
Scleroderma (Scl-70) (ENA) Antibody, IgG: <0.2 AI (ref 0.0–0.9)
ds DNA Ab: <1 [IU]/mL (ref 0–9)

## 2024-05-07 LAB — RENAL FUNCTION PANEL
Albumin: 3 g/dL — ABNORMAL LOW (ref 3.5–5.0)
Anion gap: 9 (ref 5–15)
BUN: 45 mg/dL — ABNORMAL HIGH (ref 8–23)
CO2: 22 mmol/L (ref 22–32)
Calcium: 8.6 mg/dL — ABNORMAL LOW (ref 8.9–10.3)
Chloride: 107 mmol/L (ref 98–111)
Creatinine, Ser: 5.35 mg/dL — ABNORMAL HIGH (ref 0.44–1.00)
GFR, Estimated: 9 mL/min — ABNORMAL LOW (ref >60)
Glucose, Bld: 95 mg/dL (ref 70–99)
Phosphorus: 5.7 mg/dL — ABNORMAL HIGH (ref 2.5–4.6)
Potassium: 4.6 mmol/L (ref 3.5–5.1)
Sodium: 138 mmol/L (ref 135–145)

## 2024-05-07 LAB — LIPID PANEL
Cholesterol: 149 mg/dL (ref 0–200)
HDL: 25 mg/dL — ABNORMAL LOW (ref >40)
LDL Cholesterol: 65 mg/dL (ref 0–99)
Total CHOL/HDL Ratio: 6 ratio
Triglycerides: 296 mg/dL — ABNORMAL HIGH (ref <150)
VLDL: 59 mg/dL — ABNORMAL HIGH (ref 0–40)

## 2024-05-07 LAB — GLUCOSE, CAPILLARY
Glucose-Capillary: 81 mg/dL (ref 70–99)
Glucose-Capillary: 162 mg/dL — ABNORMAL HIGH (ref 70–99)
Glucose-Capillary: 173 mg/dL — ABNORMAL HIGH (ref 70–99)
Glucose-Capillary: 202 mg/dL — ABNORMAL HIGH (ref 70–99)
Glucose-Capillary: 170 mg/dL — ABNORMAL HIGH (ref 70–99)

## 2024-05-07 LAB — ANCA TITERS
Atypical P-ANCA titer: <1:20 {titer}
C-ANCA: <1:20 {titer}
P-ANCA: <1:20 {titer}

## 2024-05-07 LAB — NM MYOCAR MULTI W/SPECT W/WALL MOTION / EF
Base ST Depression (mm): 0 mm
Exercise duration (min): 0 min
LV dias vol: 147 mL (ref 46–106)
LV sys vol: 94 mL (ref 3.8–5.2)
MPHR: 159 {beats}/min
Nuc Stress EF: 36 %
Peak HR: 106 {beats}/min
Percent HR: 66 %
RATE: 0.2
Rest HR: 64 {beats}/min
Rest Nuclear Isotope Dose: 10.4 mCi
SDS: 3
SRS: 4
SSS: 7
ST Depression (mm): 0 mm
Stress Nuclear Isotope Dose: 32.8 mCi
TID: 1.07

## 2024-05-07 LAB — C4 COMPLEMENT: Complement C4, Body Fluid: 31 mg/dL (ref 12–38)

## 2024-05-07 LAB — ANA W/REFLEX IF POSITIVE: Anti Nuclear Antibody (ANA): NEGATIVE

## 2024-05-07 LAB — C3 COMPLEMENT: C3 Complement: 136 mg/dL (ref 82–167)

## 2024-05-07 LAB — ANTI-JO 1 ANTIBODY, IGG: Anti JO-1: <0.2 AI (ref 0.0–0.9)

## 2024-05-07 LAB — ANTISTREPTOLYSIN O TITER: ASO: 149 [IU]/mL (ref 0.0–200.0)

## 2024-05-07 MED ORDER — GABAPENTIN 300 MG PO CAPS
300.0000 mg | ORAL_CAPSULE | Freq: Two times a day (BID) | ORAL | Status: DC
  Administered 2024-05-07 – 2024-05-08 (×2): 300 mg via ORAL
  Filled 2024-05-07 (×2): qty 1

## 2024-05-07 MED ORDER — TECHNETIUM TC 99M TETROFOSMIN IV KIT
30.0000 | PACK | Freq: Once | INTRAVENOUS | Status: AC | PRN
  Administered 2024-05-07: 32.8 via INTRAVENOUS

## 2024-05-07 MED ORDER — SODIUM CHLORIDE FLUSH 0.9 % IV SOLN
INTRAVENOUS | Status: AC
Start: 2024-05-07 — End: 2024-05-07
  Administered 2024-05-07: 10 mL via INTRAVENOUS
  Filled 2024-05-07: qty 10

## 2024-05-07 MED ORDER — REGADENOSON 0.4 MG/5ML IV SOLN
INTRAVENOUS | Status: AC
  Administered 2024-05-07: 0.4 mg via INTRAVENOUS
  Filled 2024-05-07: qty 5

## 2024-05-07 MED ORDER — SODIUM CHLORIDE 0.9 % IV SOLN: INTRAVENOUS | Status: AC

## 2024-05-07 MED ORDER — TECHNETIUM TC 99M TETROFOSMIN IV KIT
10.0000 | PACK | Freq: Once | INTRAVENOUS | Status: AC | PRN
  Administered 2024-05-07: 10.4 via INTRAVENOUS

## 2024-05-07 MED ORDER — SODIUM CHLORIDE 0.9 % IV SOLN: INTRAVENOUS | Status: DC

## 2024-05-07 NOTE — Progress Notes (Signed)
 PROGRESS NOTE  Annjanette Wertenberger FMW:[id number] DOB: 06/30/1961 DOA: 05/05/2024 PCP: System, Provider Not In  Brief History:  63 year old female with a history of CAD s/pDES-RCA 2022 ([place]) , hypertension, hyperlipidemia, T2DM, DVT on Eliquis , paroxysmal atrial fibrillation, bipolar disorder, CKD stage IV, and HFpEF presenting with chest pain and abdominal pain that began around 4 PM on 05/05/2024.  The patient was sitting on her couch watching television at the time.  She has not had any exertional chest pain.  She denies any fevers, chills, headache, neck pain, cough, hemoptysis.  She does have chronic dyspnea on exertion. She stated that she had some nausea and dry heaves on 05/05/2024, but this has improved.  She has not had any diarrhea, hematochezia, melena, dysuria, hematuria.  She denies any new medications.  She denies any NSAIDs.  She states that she was admitted to [facility] a little over a week prior to this admission.  She states that she only stayed in the hospital for 2 days, and she was treated for pneumonia and given 1 unit of PRBC.  In the ED, the patient was afebrile hemodynamically stable with oxygen  saturation 100% room air.  WBC 6.0, hemoglobin 8.9, platelets 200.  Sodium 136, potassium 4.9, bicarbonate 18, serum creatinine 5.57.  The patient was given 1 L of LR and morphine  4 mg x 1.  Troponin 4 >> 4.  EKG shows sinus rhythm with no concerning ST/T wave changes.  CT abdomen and pelvis was negative for any acute findings.  She was admitted for further evaluation and treatment of her acute kidney injury and chest pain.  The patient has had numerous hospitalizations. More recently, she was admitted to [facility] 12/13/2023 to 12/17/23.  At that time, the patient had hemoglobin of 6.0.  She underwent EGD which showed gastric polyps which were felt to be the source of her anemia.  She was transfused 2 units PRBC.  During this hospital admission, her serum creatinine was  noted to be 3.30 on 12/17/23      Assessment/Plan: Acute on chronic renal failure--CKD stage IV - Baseline creatinine 3.0-3.3 - Renal ultrasound--negative hydronephrosis - Nephrology consult appreciated--discussed with Dr. Jerrye - continue IV fluids judiciously - she may have progression of CKD - autoimmune antibodies neg (ANCA/ANA/ENA/SSA/SSB/RNP)   Chest pain - Troponin 4>> 4 - 04/22/2023 echo EF 60 to 65%, normal RVF, trivial TR - 05/06/24 Echo EF 60-65%, no WMA, G1DD - Lexiscan  8/22 - continue imdur --pt had run out x 3 months - Lexiscan --small reversible perfusion defect with mild reduction in uptake present in the apical anterior and apex with normal wall motion in the defect area consistent with a combination of ischemia and artifact.  - 05/07/24--discussed with cardiology--ok from cardiac standpoint to d/c home   Paroxysmal atrial fibrillation - Currently in sinus rhythm - Continue apixaban  - Continue carvedilol    Chronic HFpEF - Clinically compensated - 04/22/2023 echo EF 60 to 65%, normal RVF, trivial TR   Coronary artery disease - Status post DES to the RCA 2022 in [place]  -Patient had unstable angina 03/2023--LHC was deferred at that time secondary to her poor renal function - Continue carvedilol , Lipitor, Plavix , Imdur  - not on aspirin  due to Eliquis  use   Carotid stenosis -05/05/24 Carotid US  showed moderate L carotid stenosis 50-69% which will need to be followed as OP  -doubt this is clinically contributing to her dizziness   Anemia of CKD - iron saturation 32  ferritin 289 - folate 289 - B12 172>>replete with IM x 1, then po - Baseline hemoglobin 8-9   Essential hypertension - Continue carvedilol  - Holding amlodipine  temporarily   Uncontrolled diabetes mellitus type 2 with hyperglycemia - Continue reduced dose Semglee  - 05/06/24 hemoglobin A1c--6.5 - NovoLog  sliding scale   Chronic constipation  - pt reports having diabetic neuropathy and chronic problems  with constipation  - pt has been on regular laxatives and has had a bowel movement - 05/05/24 CT AP--neg for acute findings   Bipolar disorder, NOS - Continue home trazodone , lamictal , sertraline  --Review of the medical record shows that she is no longer taking these medications even as of her recent hospitalization at [facility]       Family Communication:  no Family at bedside  Consultants:  renal, cardiology  Code Status:  FULL  DVT Prophylaxis:  apixaban    Procedures: As Listed in Progress Note Above  Antibiotics: None          Subjective: Pt states she still has some cp, but is a little better.  Denies n/v/d, abd pain, sob  Objective: Vitals:   05/06/24 1721 05/06/24 2012 05/07/24 0019 05/07/24 0430  BP: (!) 127/54 (!) 117/57 (!) 143/61 (!) 144/50  Pulse: 62 61 65 66  Resp:  18 18 18   Temp: 98.1 F (36.7 C) 98 F (36.7 C) 97.9 F (36.6 C) 97.8 F (36.6 C)  TempSrc: Oral Oral Oral Oral  SpO2: 97% 99% 100% 99%  Weight:      Height:        Intake/Output Summary (Last 24 hours) at 05/07/2024 1336 Last data filed at 05/07/2024 0418 Gross per 24 hour  Intake 929.37 ml  Output --  Net 929.37 ml   Weight change:  Exam:  General:  Pt is alert, follows commands appropriately, not in acute distress HEENT: No icterus, No thrush, No neck mass, [place]/AT Cardiovascular: RRR, S1/S2, no rubs, no gallops Respiratory: fine bibasilar rales. No wheeze Abdomen: Soft/+BS, non tender, non distended, no guarding Extremities: No edema, No lymphangitis, No petechiae, No rashes, no synovitis   Data Reviewed: I have personally reviewed following labs and imaging studies Basic Metabolic Panel: Recent Labs  Lab 05/05/24 1835 05/06/24 0228 05/07/24 0428  NA 136 136 138  K 4.9 4.6 4.6  CL 105 106 107  CO2 18* 19* 22  GLUCOSE 163* 126* 95  BUN 45* 45* 45*  CREATININE 5.57* 5.33* 5.35*  CALCIUM  8.7* 8.6* 8.6*  MG  --  1.3*  --   PHOS  --  5.4* 5.7*    Liver Function Tests: Recent Labs  Lab 05/05/24 1936 05/06/24 0228 05/07/24 0428  AST 11* 32  --   ALT 11 22  --   ALKPHOS 50 71  --   BILITOT 0.7 0.5  --   PROT 8.9* 8.1  --   ALBUMIN  3.3* 3.0* 3.0*   Recent Labs  Lab 05/05/24 1936  LIPASE 44   No results for input(s): AMMONIA in the last 168 hours. Coagulation Profile: No results for input(s): INR, PROTIME in the last 168 hours. CBC: Recent Labs  Lab 05/05/24 1927 05/06/24 0228 05/07/24 0428  WBC 6.0 5.1 4.5  HGB 8.9* 7.9* 7.6*  HCT 27.3* 23.8* 23.9*  MCV 92.2 90.8 94.1  PLT 200 171 166   Cardiac Enzymes: No results for input(s): CKTOTAL, CKMB, CKMBINDEX, TROPONINI in the last 168 hours. BNP: Invalid input(s): POCBNP CBG: Recent Labs  Lab 05/06/24 1135 05/06/24  1612 05/06/24 2018 05/07/24 0722 05/07/24 1129  GLUCAP 126* 114* 132* 81 162*   HbA1C: Recent Labs    05/06/24 0228 05/06/24 0748  HGBA1C 6.3* 6.5*   Urine analysis:    Component Value Date/Time   COLORURINE YELLOW 05/05/2024 2203   APPEARANCEUR CLOUDY (A) 05/05/2024 2203   LABSPEC 1.012 05/05/2024 2203   PHURINE 5.0 05/05/2024 2203   GLUCOSEU NEGATIVE 05/05/2024 2203   HGBUR SMALL (A) 05/05/2024 2203   BILIRUBINUR NEGATIVE 05/05/2024 2203   KETONESUR NEGATIVE 05/05/2024 2203   PROTEINUR 100 (A) 05/05/2024 2203   UROBILINOGEN 0.2 12/17/2011 2258   NITRITE NEGATIVE 05/05/2024 2203   LEUKOCYTESUR SMALL (A) 05/05/2024 2203   Sepsis Labs: @LABRCNTIP (procalcitonin:4,lacticidven:4) )No results found for this or any previous visit (from the past 240 hours).   Scheduled Meds:  apixaban   5 mg Oral BID   atorvastatin   40 mg Oral q morning   carvedilol   12.5 mg Oral Q breakfast   darbepoetin (ARANESP ) injection - DIALYSIS  60 mcg Subcutaneous Q Thu-1800   insulin  aspart  0-5 Units Subcutaneous QHS   insulin  aspart  0-9 Units Subcutaneous TID WC   insulin  glargine-yfgn  10 Units Subcutaneous QHS   isosorbide   mononitrate  60 mg Oral q morning   pantoprazole   40 mg Oral BID   vitamin B-12  500 mcg Oral Daily   Continuous Infusions:  sodium chloride  75 mL/hr at 05/07/24 1141    Procedures/Studies: ECHOCARDIOGRAM COMPLETE Result Date: 05/06/2024    ECHOCARDIOGRAM REPORT   Patient Name:   ALEESA SWEIGERT Hamelin Date of Exam: 05/06/2024 Medical Rec #:  [id number]      Height:       63.0 in Accession #:    [id number]     Weight:       231.5 lb Date of Birth:  1961-06-12      BSA:          2.058 m Patient Age:    62 years       BP:           109/48 mmHg Patient Gender: F              HR:           64 bpm. Exam Location:  [facility] Procedure: 2D Echo, Cardiac Doppler, Color Doppler and Intracardiac            Opacification Agent (Both Spectral and Color Flow Doppler were            utilized during procedure). Indications:    Syncope  History:        Patient has prior history of Echocardiogram examinations.                 Signs/Symptoms:Syncope; Risk Factors:Hypertension.  Sonographer:    Vella Key Referring Phys: [id number] OLADAPO ADEFESO IMPRESSIONS  1. No LV thrombus by Definity . Left ventricular ejection fraction, by estimation, is 60 to 65%. The left ventricle has normal function. The left ventricle has no regional wall motion abnormalities. There is mild left ventricular hypertrophy. Left ventricular diastolic parameters are consistent with Grade I diastolic dysfunction (impaired relaxation).  2. Right ventricular systolic function is normal. The right ventricular size is normal. Tricuspid regurgitation signal is inadequate for assessing PA pressure.  3. The mitral valve is normal in structure. Trivial mitral valve regurgitation. No evidence of mitral stenosis.  4. The aortic valve is tricuspid. Aortic valve regurgitation is not visualized. No aortic stenosis is present. Comparison(s): No  significant change from prior study. FINDINGS  Left Ventricle: No LV thrombus by Definity . Left ventricular ejection fraction, by  estimation, is 60 to 65%. The left ventricle has normal function. The left ventricle has no regional wall motion abnormalities. Strain was performed and the global longitudinal strain is indeterminate. The left ventricular internal cavity size was normal in size. There is mild left ventricular hypertrophy. Left ventricular diastolic parameters are consistent with Grade I diastolic dysfunction (impaired relaxation).  Normal left ventricular filling pressure. Right Ventricle: The right ventricular size is normal. No increase in right ventricular wall thickness. Right ventricular systolic function is normal. Tricuspid regurgitation signal is inadequate for assessing PA pressure. Left Atrium: Left atrial size was normal in size. Right Atrium: Right atrial size was normal in size. Pericardium: There is no evidence of pericardial effusion. Mitral Valve: The mitral valve is normal in structure. Trivial mitral valve regurgitation. No evidence of mitral valve stenosis. Tricuspid Valve: The tricuspid valve is normal in structure. Tricuspid valve regurgitation is mild . No evidence of tricuspid stenosis. Aortic Valve: The aortic valve is tricuspid. Aortic valve regurgitation is not visualized. No aortic stenosis is present. Pulmonic Valve: The pulmonic valve was normal in structure. Pulmonic valve regurgitation is trivial. No evidence of pulmonic stenosis. Aorta: The aortic root and ascending aorta are structurally normal, with no evidence of dilitation. Venous: The inferior vena cava was not well visualized. IAS/Shunts: The interatrial septum was not well visualized. Additional Comments: 3D was performed not requiring image post processing on an independent workstation and was indeterminate.  LEFT VENTRICLE PLAX 2D LVIDd:         4.50 cm      Diastology LVIDs:         3.40 cm      LV e' medial:    7.18 cm/s LV PW:         1.20 cm      LV E/e' medial:  10.9 LV IVS:        1.20 cm      LV e' lateral:   8.32 cm/s LVOT diam:      1.60 cm      LV E/e' lateral: 9.4 LV SV:         46 LV SV Index:   22 LVOT Area:     2.01 cm  LV Volumes (MOD) LV vol d, MOD A2C: 117.0 ml LV vol d, MOD A4C: 160.0 ml LV vol s, MOD A2C: 26.1 ml LV vol s, MOD A4C: 40.4 ml LV SV MOD A2C:     90.9 ml LV SV MOD A4C:     160.0 ml LV SV MOD BP:      104.9 ml RIGHT VENTRICLE RV Basal diam:  3.40 cm RV S prime:     11.80 cm/s TAPSE (M-mode): 3.2 cm LEFT ATRIUM             Index        RIGHT ATRIUM           Index LA diam:        3.50 cm 1.70 cm/m   RA Area:     15.10 cm LA Vol (A2C):   70.8 ml 34.41 ml/m  RA Volume:   35.20 ml  17.11 ml/m LA Vol (A4C):   46.9 ml 22.79 ml/m LA Biplane Vol: 59.7 ml 29.01 ml/m  AORTIC VALVE LVOT Vmax:   108.00 cm/s LVOT Vmean:  68.300 cm/s LVOT VTI:    0.229 m  AORTA  Ao Root diam: 3.00 cm Ao Asc diam:  3.40 cm MITRAL VALVE MV Area (PHT): 4.31 cm    SHUNTS MV Decel Time: 176 msec    Systemic VTI:  0.23 m MV E velocity: 78.30 cm/s  Systemic Diam: 1.60 cm MV A velocity: 68.40 cm/s MV E/A ratio:  1.14 Vishnu Priya Mallipeddi Electronically signed by Diannah Late Mallipeddi Signature Date/Time: 05/06/2024/12:06:13 PM    Final    US  Carotid Bilateral Result Date: 05/06/2024 CLINICAL DATA:  Hypertension.  Syncope.  Hyperlipidemia.  Diabetes. EXAM: BILATERAL CAROTID DUPLEX ULTRASOUND TECHNIQUE: Elnor scale imaging, color Doppler and duplex ultrasound were performed of bilateral carotid and vertebral arteries in the neck. COMPARISON:  01/06/2006 FINDINGS: Criteria: Quantification of carotid stenosis is based on velocity parameters that correlate the residual internal carotid diameter with NASCET-based stenosis levels, using the diameter of the distal internal carotid lumen as the denominator for stenosis measurement. The following velocity measurements were obtained: RIGHT ICA: 109/27 cm/sec CCA: 97/18 cm/sec SYSTOLIC ICA/CCA RATIO:  1.1 ECA: 336 cm/sec LEFT ICA: 219/31 cm/sec CCA: 137/18 cm/sec SYSTOLIC ICA/CCA RATIO:  1.6 ECA: 170 cm/sec  RIGHT CAROTID ARTERY: Mild calcified atheromatous plaque of the RIGHT carotid bulb. RIGHT VERTEBRAL ARTERY:  Antegrade flow. LEFT CAROTID ARTERY: Moderate long segment calcified plaque of the distal LEFT common carotid artery extending into the proximal internal carotid artery. LEFT VERTEBRAL ARTERY:  Antegrade flow. IMPRESSION: 1. Moderate long segment calcified plaque of the distal LEFT common carotid artery extending into the proximal internal carotid artery. Doppler measurements indicative of 50-69% stenosis of the LEFT internal carotid artery. 2. Mild calcified plaque of the RIGHT carotid bulb without significant stenosis. Electronically Signed   By: Aliene Lloyd M.D.   On: 05/06/2024 10:43   US  RENAL Result Date: 05/06/2024 CLINICAL DATA:  Acute on chronic renal failure. EXAM: RENAL / URINARY TRACT ULTRASOUND COMPLETE COMPARISON:  June 06, 2023. FINDINGS: Right Kidney: Renal measurements: 9.3 x 4.3 x 4.1 cm = volume: 88 mL. Echogenicity within normal limits. No mass or hydronephrosis visualized. Left Kidney: Renal measurements: 10.3 x 5.0 x 4.5 cm = volume: 123 mL. Echogenicity within normal limits. No mass or hydronephrosis visualized. Bladder: Appears normal for degree of bladder distention. Calculated prevoid volume of 150 mL. Other: None. Electronically Signed   By: Lynwood Landy Raddle M.D.   On: 05/06/2024 10:04   CT ABDOMEN PELVIS WO CONTRAST Result Date: 05/05/2024 CLINICAL DATA:  Abdominal pain EXAM: CT ABDOMEN AND PELVIS WITHOUT CONTRAST TECHNIQUE: Multidetector CT imaging of the abdomen and pelvis was performed following the standard protocol without IV contrast. RADIATION DOSE REDUCTION: This exam was performed according to the departmental dose-optimization program which includes automated exposure control, adjustment of the mA and/or kV according to patient size and/or use of iterative reconstruction technique. COMPARISON:  06/05/2023 FINDINGS: Lower chest: No acute findings. Coronary  artery and aortic atherosclerosis. Hepatobiliary: No focal liver abnormality is seen. Status post cholecystectomy. No biliary dilatation. Pancreas: No focal abnormality or ductal dilatation. Spleen: No focal abnormality.  Normal size. Adrenals/Urinary Tract: Punctate nonobstructing bilateral renal stones. No ureteral stones or hydronephrosis. Adrenal glands and urinary bladder unremarkable. Stomach/Bowel: Stomach, large and small bowel grossly unremarkable. Vascular/Lymphatic: No evidence of aneurysm or adenopathy. Aortic atherosclerosis. Reproductive: Prior hysterectomy.  No adnexal masses. Other: No free fluid or free air. Musculoskeletal: No acute bony abnormality. IMPRESSION: No acute findings in the abdomen or pelvis. Punctate bilateral nephrolithiasis.  No hydronephrosis. Coronary artery disease, aortic atherosclerosis. Electronically Signed   By: Franky  Dover M.D.   On: 05/05/2024 23:01   DG Chest 2 View Result Date: 05/05/2024 CLINICAL DATA:  Chest and abdominal pain. EXAM: CHEST - 2 VIEW COMPARISON:  06/05/2023 and CT chest 04/15/2023. FINDINGS: Trachea is midline. Heart size normal. Lungs are clear. No pleural fluid. Flowing anterior osteophytosis in the thoracic spine. IMPRESSION: No acute findings. Electronically Signed   By: Newell Eke M.D.   On: 05/05/2024 17:58    Alm Schneider, DO  [facility]  If 7PM-7AM, please contact night-coverage [email or web address] Password [email or web address] 05/07/2024, 1:36 PM   LOS: 2 days

## 2024-05-07 NOTE — Progress Notes (Addendum)
     [name] presented for a Lexiscan  nuclear stress test today.  I Raphael LOISE Bring, PA-C, provided direct supervision and was present during the stress portion of the study today, which was completed without significant symptoms, immediate complications. Nonspecific STTW changes noted on EKG with Lexiscan  which are nonspecific in setting of pharmacologic protocol. No chest pain.  Stress imaging is pending at this time.  Preliminary ECG findings may be listed in the chart, but the stress test result will not be finalized until perfusion imaging is complete.  Raphael LOISE Bring, PA-C  05/07/2024, 8:30 AM

## 2024-05-07 NOTE — Plan of Care (Signed)
  Problem: Education: Goal: Ability to describe self-care measures that may prevent or decrease complications (Diabetes Survival Skills Education) will improve Outcome: Progressing Goal: Individualized Educational Video(s) Outcome: Progressing   Problem: Skin Integrity: Goal: Risk for impaired skin integrity will decrease Outcome: Progressing   Problem: Activity: Goal: Risk for activity intolerance will decrease Outcome: Progressing   Problem: Pain Managment: Goal: General experience of comfort will improve and/or be controlled Outcome: Progressing

## 2024-05-07 NOTE — Progress Notes (Signed)
 [facility] Progress Note  Name: [name] MRN: [id number] DOB: July 18, 1961  Chief Complaint:  Chest pain, n/v  Subjective:  Doesn't feel great.  She has been weak recently.  Has to rest because she feels like she is going to pass out and has to ride in a chair at the chair, which is unusual.  Strict ins/outs are not available.  She had one unmeasured urine void over 8/21 charted.  She got 1 liter of LR on 8/20 and got LR at 50 ml/hr x less than 24 hours starting on 8/21.  She thinks her creatinine was in the 3's recently.  She got blood during a recent admission at an outside hospital.  She feels like she may be a little dry - states has been NPO a lot recently.   Review of systems:  Some shortness of breath  Some chest discomfort  Her stomach hurts; states has nausea without vomiting earlier Sometimes dizziness  ------------------- Background on consult:  [name] is an 63 y.o. female with a PMH significant for DM, HTN, HLD, CAD, h/o DVT on Eliquis , P A fib, h/o GIB due to gastric polyps, bipolar disorder, HFpEF, and h/o AKI/CKD stage IV who presented to [facility] ED on 05/05/24 c/o chest and abdominal pain.  She did have N/V prior to presentation.  She did have a recent admission at [facility] with pneumonia.  In the ED, Temp 98.9, Bp 157/70, HR 72, SpO2 99% on room air.  Labs notable for WBC 6, Hgb 8.9, Co2 18, BUN 45, Cr 5.57, gluc 163, Ca 8.7, alb 3.3, troponin I 4.  ECG with NRS no ST/twave changes.  CT of abd/pelvis without acute findings.  We were consulted due to recurrent AKI/CKD Stage IV. The trend in Scr is seen below.    She was seen by our practice in September 2024 when she presented with AKI/CKD stage IV in setting of volume depletion and covid-19 infection.  Scr continued to climb to 9.74, however she left AMA because she was afraid of dialysis.  Next known Scr was 2.9 on 12/13/23 and then rose to 3.2 in April.  She had been followed by Nephrology in [place] but  had not seen them in over a year due to insurance issues.  She was hospitalized last week and was seen by Nephrology who set up follow up with them in September.  She thinks her Scr was in the 3's at that time but is not sure.  No recent labs available for review.  She does not take NSAIDs or Cox-II I's.  She had some N/V prior to admission and admits that she has not been eating or drinking much over the past week since her discharge.        Intake/Output Summary (Last 24 hours) at 05/07/2024 1032 Last data filed at 05/07/2024 0418 Gross per 24 hour  Intake 929.37 ml  Output --  Net 929.37 ml    Vitals:  Vitals:   05/06/24 1721 05/06/24 2012 05/07/24 0019 05/07/24 0430  BP: (!) 127/54 (!) 117/57 (!) 143/61 (!) 144/50  Pulse: 62 61 65 66  Resp:  18 18 18   Temp: 98.1 F (36.7 C) 98 F (36.7 C) 97.9 F (36.6 C) 97.8 F (36.6 C)  TempSrc: Oral Oral Oral Oral  SpO2: 97% 99% 100% 99%  Weight:      Height:         Physical Exam:  General adult female in bed in no acute  distress HEENT normocephalic atraumatic extraocular movements intact sclera anicteric Neck supple trachea midline Lungs clear to auscultation bilaterally normal work of breathing at rest  Heart S1S2 no rub Abdomen soft nontender nondistended Extremities no edema  Psych normal mood and affect Neuro alert and oriented x 3 provides hx and follows commands   Medications reviewed   Labs:     Latest Ref Rng & Units 05/07/2024    4:28 AM 05/06/2024    2:28 AM 05/05/2024    6:35 PM  BMP  Glucose 70 - 99 mg/dL 95  873  836   BUN 8 - 23 mg/dL 45  45  45   Creatinine 0.44 - 1.00 mg/dL 4.64  4.66  4.42   Sodium 135 - 145 mmol/L 138  136  136   Potassium 3.5 - 5.1 mmol/L 4.6  4.6  4.9   Chloride 98 - 111 mmol/L 107  106  105   CO2 22 - 32 mmol/L 22  19  18    Calcium  8.9 - 10.3 mg/dL 8.6  8.6  8.7      Assessment/Plan:   Assessment/Plan:  AKI/CKD stage IV vs progressive CKD Stage V - Underlying CKD stage IV due to  longstanding, poorly controlled DM and HTN.  Had severe AKI in September last year with Scr of 9.75 and did not follow up until hospitalized in April 2025 and had Scr of 3.2 at that time.  No labs since for review although she reports that it was around that same number during her last admission a week ago and was set up to follow up with nephrology in [place] on 05/31/24.  This episode of AKI/CKD stage IV is presumably due to ischemic ATN in setting of volume depletion and relative hypotension.  Scr improving slightly with IVF's.  CT scan without obstruction.  Renal US  without increased echogenicity.  Unclear if this is AKI/CKD or progressive CKD.  Currently without urgent indication for HD and has had significantly higher BUN/Cr in the past that did not require RRT. Will give NS at 75 ml/hr x 24 hours    We did discuss that she has advanced CKD and will need to continue to follow closely with Nephrology after discharge.   Our team ordered serologies for completeness as this does not appear to have been performed during her admission last year.   ASO ok.  Complement ok.  ANA negative ANCA pending Note multiple myeloma panel is pending, as well I have ordered strict ins/outs   CKD stage IV with possible progression as above Chest pain - ECG and troponin normal.  S/p ECHO.  Per primary team.  Note hx CAD HTN - controlled P. Atrial fibrillation - continue with carvedilol  and Eliquis  HFpEF - appears hypovolemic to near euvolemic on exam. Will order gentle fluids x 12 hours   Anemia of CKD stage IV - TSAT 32%.   On aranesp  60 mcg every Thursday.   Transfuse for Hb <7.   CBC today and in AM.  Anticipate may need blood DM type 2 - per primary Bipolar disorder - per primary  Disposition - Continue inpatient monitoring today.  Discussed with team.  If she feels better tomorrow and if labs stable may be able to consider discharge (I do feel like she will likely need PRBC's prior to discharge).  She  states she has outpatient follow-up with her nephrologist in [place]  in early September.  I have emphasized the importance of attending this follow-up  Nephrology will review her labs over the weekend but she will not be physically seen.  Please do not hesitate to reach out to the nephrologist on call with any questions or concerns  Katheryn JAYSON Saba, MD 05/07/2024 11:17 AM

## 2024-05-07 NOTE — TOC Initial Note (Signed)
 Transition of Care [facility]) - Initial/Assessment Note    Patient Details  Name: [name] MRN: [id number] Date of Birth: May 20, 1961  [facility] CM/SW Contact:    Lucie Lunger, LCSWA Phone Number: 05/07/2024, 10:41 AM  Clinical Narrative:                 Pt is high risk for readmission. CSW spoke with pt at bedside to complete assessment. Pt lives with family. Pt states she is independent with ADLs but has assistance if needed. Pt can drive and family can provide transport as well. Pt states she has not had HH. Pt has a rollator to use when needed. TOC to follow.   Expected Discharge Plan: Home/Self Care Barriers to Discharge: Continued Medical Work up   Patient Goals and CMS Choice Patient states their goals for this hospitalization and ongoing recovery are:: From home CMS Medicare.gov Compare Post Acute Care list provided to:: Patient Choice offered to / list presented to : Patient      Expected Discharge Plan and Services In-house Referral: Clinical Social Work Discharge Planning Services: CM Consult   Living arrangements for the past 2 months: Single Family Home                                      Prior Living Arrangements/Services Living arrangements for the past 2 months: Single Family Home Lives with:: Relatives Patient language and need for interpreter reviewed:: Yes Do you feel safe going back to the place where you live?: Yes      Need for Family Participation in Patient Care: Yes (Comment) Care giver support system in place?: Yes (comment) Current home services: DME Criminal Activity/Legal Involvement Pertinent to Current Situation/Hospitalization: No - Comment as needed  Activities of Daily Living   ADL Screening (condition at time of admission) Independently performs ADLs?: Yes (appropriate for developmental age) Is the patient deaf or have difficulty hearing?: No Does the patient have difficulty seeing, even when wearing  glasses/contacts?: No Does the patient have difficulty concentrating, remembering, or making decisions?: No  Permission Sought/Granted                  Emotional Assessment Appearance:: Appears stated age Attitude/Demeanor/Rapport: Engaged Affect (typically observed): Accepting Orientation: : Oriented to Self, Oriented to Place, Oriented to  Time, Oriented to Situation Alcohol / Substance Use: Not Applicable Psych Involvement: No (comment)  Admission diagnosis:  AKI (acute kidney injury) (HCC) [N17.9] Near syncope [R55] Chest pain [R07.9] Chest pain, unspecified type [R07.9] Patient Active Problem List   Diagnosis Date Noted   Near syncope 05/06/2024   CKD (chronic kidney disease) stage 4, GFR 15-29 ml/min (HCC) 05/06/2024   PAF (paroxysmal atrial fibrillation) (HCC) 05/06/2024   Chest pain 05/05/2024   Chronic heart failure with preserved ejection fraction (HFpEF) (HCC) 06/08/2023   Paroxysmal atrial fibrillation (HCC) 06/08/2023   Hypocalcemia 06/07/2023   Acute cystitis 06/07/2023   Pancytopenia (HCC) 06/06/2023   Acute kidney injury superimposed on stage 5 chronic kidney disease, not on chronic dialysis (HCC) 06/05/2023   Hypokalemia 06/05/2023   COVID-19 virus infection 06/05/2023   Thrombocytopenia (HCC) 06/05/2023   Hypoalbuminemia due to protein-calorie malnutrition (HCC) 06/05/2023   Atrial fibrillation, chronic (HCC) 06/05/2023   Orthostatic hypotension 05/02/2023   Hyponatremia 05/02/2023   Chronic diastolic heart failure (HCC) 04/23/2023   Unstable angina (HCC) 04/22/2023   Stage 3b chronic kidney  disease (HCC) 04/22/2023   Type 2 diabetes mellitus with complication, with long-term current use of insulin  (HCC) 04/22/2023   Acute on chronic heart failure with preserved ejection fraction (HCC) 04/22/2023   Iron deficiency anemia 04/22/2023   RLQ abdominal pain 04/18/2023   Chest pain with high risk for cardiac etiology 04/17/2023   Atypical chest pain  04/16/2023   Acute kidney injury superimposed on chronic kidney disease (HCC) 04/16/2023   Type 2 diabetes mellitus with hyperglycemia (HCC) 04/16/2023   Persistent atrial fibrillation (HCC) 04/16/2023   Obesity, Class III, BMI 40-49.9 (morbid obesity) 04/16/2023   Erroneous encounter - disregard 02/14/2023   Left-sided weakness 02/08/2023   BRBPR (bright red blood per rectum) 04/07/2012   GASTRIC POLYP 10/21/2006   Mixed hyperlipidemia 10/21/2006   DISORDER, BIPOLAR NOS 10/21/2006   DEPRESSION 10/21/2006   Migraine headache 10/21/2006   Essential hypertension 10/21/2006   Coronary artery disease 10/21/2006   GERD 10/21/2006   LOW BACK PAIN 10/21/2006   PCP:  System, Provider Not In Pharmacy:   [id number] [place], [place] - [address] AT [place] [address] [place] [address] Phone: [phone] Fax: [phone]     Social Drivers of Health (SDOH) Social History: SDOH Screenings   Food Insecurity: No Food Insecurity (05/06/2024)  Housing: Low Risk  (05/06/2024)  Transportation Needs: Unmet Transportation Needs (05/06/2024)  Utilities: Not At Risk (05/06/2024)  Tobacco Use: Medium Risk (05/05/2024)   SDOH Interventions:     Readmission Risk Interventions    05/07/2024   10:40 AM 06/05/2023   12:59 PM  Readmission Risk Prevention Plan  Transportation Screening Complete Complete  HRI or Home Care Consult Complete   Social Work Consult for Recovery Care Planning/Counseling Complete   Palliative Care Screening Not Applicable   Medication Review Oceanographer) Complete Complete  HRI or Home Care Consult  Complete  SW Recovery Care/Counseling Consult  Complete  Palliative Care Screening  Not Applicable  Skilled Nursing Facility  Not Applicable

## 2024-05-07 NOTE — Progress Notes (Signed)
 Progress Note  Patient Name: [name] Date of Encounter: 05/07/2024  Primary Cardiologist: Vishnu P Mallipeddi, MD  Subjective   Feeling weak but no CP or SOB this AM. Tolerated Lexiscan  well with only transient mild nausea, no cardiac symptoms.  Inpatient Medications    Scheduled Meds:  apixaban   5 mg Oral BID   atorvastatin   40 mg Oral q morning   carvedilol   12.5 mg Oral Q breakfast   darbepoetin (ARANESP ) injection - DIALYSIS  60 mcg Subcutaneous Q Thu-1800   insulin  aspart  0-5 Units Subcutaneous QHS   insulin  aspart  0-9 Units Subcutaneous TID WC   insulin  glargine-yfgn  10 Units Subcutaneous QHS   isosorbide  mononitrate  60 mg Oral q morning   pantoprazole   40 mg Oral BID   vitamin B-12  500 mcg Oral Daily   Continuous Infusions:  PRN Meds: acetaminophen  **OR** acetaminophen , HYDROmorphone  (DILAUDID ) injection, ondansetron  **OR** ondansetron  (ZOFRAN ) IV, technetium tetrofosmin    Vital Signs    Vitals:   05/06/24 1721 05/06/24 2012 05/07/24 0019 05/07/24 0430  BP: (!) 127/54 (!) 117/57 (!) 143/61 (!) 144/50  Pulse: 62 61 65 66  Resp:  18 18 18   Temp: 98.1 F (36.7 C) 98 F (36.7 C) 97.9 F (36.6 C) 97.8 F (36.6 C)  TempSrc: Oral Oral Oral Oral  SpO2: 97% 99% 100% 99%  Weight:      Height:        Intake/Output Summary (Last 24 hours) at 05/07/2024 0856 Last data filed at 05/07/2024 0418 Gross per 24 hour  Intake 929.37 ml  Output --  Net 929.37 ml      05/06/2024    2:30 PM 06/05/2023    6:05 AM 06/04/2023   10:40 PM  Last 3 Weights  Weight (lbs) 229 lb 8 oz 231 lb 7.7 oz 244 lb 14.9 oz  Weight (kg) 104.1 kg 105 kg 111.1 kg     Telemetry    NSR - Personally Reviewed  Physical Exam   GEN: No acute distress.  HEENT: Normocephalic, atraumatic, sclera non-icteric. Neck: No JVD or bruits. Cardiac: RRR no murmurs, rubs, or gallops.  Respiratory: Clear to auscultation bilaterally. Breathing is unlabored. GI: Soft, nontender, non-distended,  BS +x 4. MS: no deformity. Extremities: No clubbing or cyanosis. No edema. Distal pedal pulses are 2+ and equal bilaterally. Neuro:  AAOx3. Follows commands. Psych:  Responds to questions appropriately with a normal affect.  Labs    High Sensitivity Troponin:   Recent Labs  Lab 05/05/24 1835 05/05/24 1936  TROPONINIHS 4 4      Cardiac EnzymesNo results for input(s): TROPONINI in the last 168 hours. No results for input(s): TROPIPOC in the last 168 hours.   Chemistry Recent Labs  Lab 05/05/24 1835 05/05/24 1936 05/06/24 0228 05/07/24 0428  NA 136  --  136 138  K 4.9  --  4.6 4.6  CL 105  --  106 107  CO2 18*  --  19* 22  GLUCOSE 163*  --  126* 95  BUN 45*  --  45* 45*  CREATININE 5.57*  --  5.33* 5.35*  CALCIUM  8.7*  --  8.6* 8.6*  PROT  --  8.9* 8.1  --   ALBUMIN   --  3.3* 3.0* 3.0*  AST  --  11* 32  --   ALT  --  11 22  --   ALKPHOS  --  50 71  --   BILITOT  --  0.7 0.5  --  GFRNONAA 8*  --  9* 9*  ANIONGAP 13  --  11 9     Hematology Recent Labs  Lab 05/05/24 1927 05/06/24 0228  WBC 6.0 5.1  RBC 2.96* 2.62*  HGB 8.9* 7.9*  HCT 27.3* 23.8*  MCV 92.2 90.8  MCH 30.1 30.2  MCHC 32.6 33.2  RDW 16.0* 15.9*  PLT 200 171    BNPNo results for input(s): BNP, PROBNP in the last 168 hours.   DDimer No results for input(s): DDIMER in the last 168 hours.   Radiology    ECHOCARDIOGRAM COMPLETE Result Date: 05/06/2024    ECHOCARDIOGRAM REPORT   Patient Name:   [name] Date of Exam: 05/06/2024 Medical Rec #:  [id number]      Height:       63.0 in Accession #:    [id number]     Weight:       231.5 lb Date of Birth:  April 13, 1961      BSA:          2.058 m Patient Age:    62 years       BP:           109/48 mmHg Patient Gender: F              HR:           64 bpm. Exam Location:  [facility] Procedure: 2D Echo, Cardiac Doppler, Color Doppler and Intracardiac            Opacification Agent (Both Spectral and Color Flow Doppler were            utilized  during procedure). Indications:    Syncope  History:        Patient has prior history of Echocardiogram examinations.                 Signs/Symptoms:Syncope; Risk Factors:Hypertension.  Sonographer:    Vella Key Referring Phys: [id number] OLADAPO ADEFESO IMPRESSIONS  1. No LV thrombus by Definity . Left ventricular ejection fraction, by estimation, is 60 to 65%. The left ventricle has normal function. The left ventricle has no regional wall motion abnormalities. There is mild left ventricular hypertrophy. Left ventricular diastolic parameters are consistent with Grade I diastolic dysfunction (impaired relaxation).  2. Right ventricular systolic function is normal. The right ventricular size is normal. Tricuspid regurgitation signal is inadequate for assessing PA pressure.  3. The mitral valve is normal in structure. Trivial mitral valve regurgitation. No evidence of mitral stenosis.  4. The aortic valve is tricuspid. Aortic valve regurgitation is not visualized. No aortic stenosis is present. Comparison(s): No significant change from prior study. FINDINGS  Left Ventricle: No LV thrombus by Definity . Left ventricular ejection fraction, by estimation, is 60 to 65%. The left ventricle has normal function. The left ventricle has no regional wall motion abnormalities. Strain was performed and the global longitudinal strain is indeterminate. The left ventricular internal cavity size was normal in size. There is mild left ventricular hypertrophy. Left ventricular diastolic parameters are consistent with Grade I diastolic dysfunction (impaired relaxation).  Normal left ventricular filling pressure. Right Ventricle: The right ventricular size is normal. No increase in right ventricular wall thickness. Right ventricular systolic function is normal. Tricuspid regurgitation signal is inadequate for assessing PA pressure. Left Atrium: Left atrial size was normal in size. Right Atrium: Right atrial size was normal in size.  Pericardium: There is no evidence of pericardial effusion. Mitral Valve: The mitral valve is normal in structure. Trivial  mitral valve regurgitation. No evidence of mitral valve stenosis. Tricuspid Valve: The tricuspid valve is normal in structure. Tricuspid valve regurgitation is mild . No evidence of tricuspid stenosis. Aortic Valve: The aortic valve is tricuspid. Aortic valve regurgitation is not visualized. No aortic stenosis is present. Pulmonic Valve: The pulmonic valve was normal in structure. Pulmonic valve regurgitation is trivial. No evidence of pulmonic stenosis. Aorta: The aortic root and ascending aorta are structurally normal, with no evidence of dilitation. Venous: The inferior vena cava was not well visualized. IAS/Shunts: The interatrial septum was not well visualized. Additional Comments: 3D was performed not requiring image post processing on an independent workstation and was indeterminate.  LEFT VENTRICLE PLAX 2D LVIDd:         4.50 cm      Diastology LVIDs:         3.40 cm      LV e' medial:    7.18 cm/s LV PW:         1.20 cm      LV E/e' medial:  10.9 LV IVS:        1.20 cm      LV e' lateral:   8.32 cm/s LVOT diam:     1.60 cm      LV E/e' lateral: 9.4 LV SV:         46 LV SV Index:   22 LVOT Area:     2.01 cm  LV Volumes (MOD) LV vol d, MOD A2C: 117.0 ml LV vol d, MOD A4C: 160.0 ml LV vol s, MOD A2C: 26.1 ml LV vol s, MOD A4C: 40.4 ml LV SV MOD A2C:     90.9 ml LV SV MOD A4C:     160.0 ml LV SV MOD BP:      104.9 ml RIGHT VENTRICLE RV Basal diam:  3.40 cm RV S prime:     11.80 cm/s TAPSE (M-mode): 3.2 cm LEFT ATRIUM             Index        RIGHT ATRIUM           Index LA diam:        3.50 cm 1.70 cm/m   RA Area:     15.10 cm LA Vol (A2C):   70.8 ml 34.41 ml/m  RA Volume:   35.20 ml  17.11 ml/m LA Vol (A4C):   46.9 ml 22.79 ml/m LA Biplane Vol: 59.7 ml 29.01 ml/m  AORTIC VALVE LVOT Vmax:   108.00 cm/s LVOT Vmean:  68.300 cm/s LVOT VTI:    0.229 m  AORTA Ao Root diam: 3.00 cm Ao Asc  diam:  3.40 cm MITRAL VALVE MV Area (PHT): 4.31 cm    SHUNTS MV Decel Time: 176 msec    Systemic VTI:  0.23 m MV E velocity: 78.30 cm/s  Systemic Diam: 1.60 cm MV A velocity: 68.40 cm/s MV E/A ratio:  1.14 Vishnu Priya Mallipeddi Electronically signed by Diannah Late Mallipeddi Signature Date/Time: 05/06/2024/12:06:13 PM    Final    US  Carotid Bilateral Result Date: 05/06/2024 CLINICAL DATA:  Hypertension.  Syncope.  Hyperlipidemia.  Diabetes. EXAM: BILATERAL CAROTID DUPLEX ULTRASOUND TECHNIQUE: Elnor scale imaging, color Doppler and duplex ultrasound were performed of bilateral carotid and vertebral arteries in the neck. COMPARISON:  01/06/2006 FINDINGS: Criteria: Quantification of carotid stenosis is based on velocity parameters that correlate the residual internal carotid diameter with NASCET-based stenosis levels, using the diameter of the distal internal carotid lumen as  the denominator for stenosis measurement. The following velocity measurements were obtained: RIGHT ICA: 109/27 cm/sec CCA: 97/18 cm/sec SYSTOLIC ICA/CCA RATIO:  1.1 ECA: 336 cm/sec LEFT ICA: 219/31 cm/sec CCA: 137/18 cm/sec SYSTOLIC ICA/CCA RATIO:  1.6 ECA: 170 cm/sec RIGHT CAROTID ARTERY: Mild calcified atheromatous plaque of the RIGHT carotid bulb. RIGHT VERTEBRAL ARTERY:  Antegrade flow. LEFT CAROTID ARTERY: Moderate long segment calcified plaque of the distal LEFT common carotid artery extending into the proximal internal carotid artery. LEFT VERTEBRAL ARTERY:  Antegrade flow. IMPRESSION: 1. Moderate long segment calcified plaque of the distal LEFT common carotid artery extending into the proximal internal carotid artery. Doppler measurements indicative of 50-69% stenosis of the LEFT internal carotid artery. 2. Mild calcified plaque of the RIGHT carotid bulb without significant stenosis. Electronically Signed   By: Aliene Lloyd M.D.   On: 05/06/2024 10:43   US  RENAL Result Date: 05/06/2024 CLINICAL DATA:  Acute on chronic renal  failure. EXAM: RENAL / URINARY TRACT ULTRASOUND COMPLETE COMPARISON:  June 06, 2023. FINDINGS: Right Kidney: Renal measurements: 9.3 x 4.3 x 4.1 cm = volume: 88 mL. Echogenicity within normal limits. No mass or hydronephrosis visualized. Left Kidney: Renal measurements: 10.3 x 5.0 x 4.5 cm = volume: 123 mL. Echogenicity within normal limits. No mass or hydronephrosis visualized. Bladder: Appears normal for degree of bladder distention. Calculated prevoid volume of 150 mL. Other: None. Electronically Signed   By: Lynwood Landy Raddle M.D.   On: 05/06/2024 10:04   CT ABDOMEN PELVIS WO CONTRAST Result Date: 05/05/2024 CLINICAL DATA:  Abdominal pain EXAM: CT ABDOMEN AND PELVIS WITHOUT CONTRAST TECHNIQUE: Multidetector CT imaging of the abdomen and pelvis was performed following the standard protocol without IV contrast. RADIATION DOSE REDUCTION: This exam was performed according to the departmental dose-optimization program which includes automated exposure control, adjustment of the mA and/or kV according to patient size and/or use of iterative reconstruction technique. COMPARISON:  06/05/2023 FINDINGS: Lower chest: No acute findings. Coronary artery and aortic atherosclerosis. Hepatobiliary: No focal liver abnormality is seen. Status post cholecystectomy. No biliary dilatation. Pancreas: No focal abnormality or ductal dilatation. Spleen: No focal abnormality.  Normal size. Adrenals/Urinary Tract: Punctate nonobstructing bilateral renal stones. No ureteral stones or hydronephrosis. Adrenal glands and urinary bladder unremarkable. Stomach/Bowel: Stomach, large and small bowel grossly unremarkable. Vascular/Lymphatic: No evidence of aneurysm or adenopathy. Aortic atherosclerosis. Reproductive: Prior hysterectomy.  No adnexal masses. Other: No free fluid or free air. Musculoskeletal: No acute bony abnormality. IMPRESSION: No acute findings in the abdomen or pelvis. Punctate bilateral nephrolithiasis.  No  hydronephrosis. Coronary artery disease, aortic atherosclerosis. Electronically Signed   By: Franky Crease M.D.   On: 05/05/2024 23:01   DG Chest 2 View Result Date: 05/05/2024 CLINICAL DATA:  Chest and abdominal pain. EXAM: CHEST - 2 VIEW COMPARISON:  06/05/2023 and CT chest 04/15/2023. FINDINGS: Trachea is midline. Heart size normal. Lungs are clear. No pleural fluid. Flowing anterior osteophytosis in the thoracic spine. IMPRESSION: No acute findings. Electronically Signed   By: Newell Eke M.D.   On: 05/05/2024 17:58    Cardiac Studies   Echo 05/06/24    1. No LV thrombus by Definity . Left ventricular ejection fraction, by  estimation, is 60 to 65%. The left ventricle has normal function. The left  ventricle has no regional wall motion abnormalities. There is mild left  ventricular hypertrophy. Left  ventricular diastolic parameters are consistent with Grade I diastolic  dysfunction (impaired relaxation).   2. Right ventricular systolic function is normal. The right  ventricular  size is normal. Tricuspid regurgitation signal is inadequate for assessing  PA pressure.   3. The mitral valve is normal in structure. Trivial mitral valve  regurgitation. No evidence of mitral stenosis.   4. The aortic valve is tricuspid. Aortic valve regurgitation is not  visualized. No aortic stenosis is present.   Comparison(s): No significant change from prior study.    Patient Profile     63 y.o. female with  CAD s/p RCA PCI in 2022 in [place], [place] , Chronic HFpEF (04/2023 60-65% EF), hypertension, hyperlipidemia, T2DM, DVT on Eliquis , anemia in CKD, CKD stage IV-V, GIB of unknown source s/p prior transfusion, paroxysmal atrial fibrillation, morbid obesity, tobacco abuse presented to [facility] with chest pain/SOB. Cardioogy consulted, troponins negative. Also following with nephrology for progressive CKD not yet on HD.  Assessment & Plan    1. Chest pain: Symptoms felt somewhat atypical. Troponins  negative. Echo showed no significant change from prior, EF 60-65%, mild LVH, G1DD. Nuclear stress test showed nonspecific STTW changes during Lexiscan , nonspecific during pharmacologic study, tolerated well. Perfusion imaging is pending.  2. CAD s/p RCA PCI in 2022: Nuc report pending above. Med list included Plavix  PTA, not resumed here. Await MD input regarding this. Note recent admissions requiring transfusion at outside hospital. Remains on carvedilol  12.5mg  once daily (?), Imdur  60mg  daily. Med plan per MD.  3. Dizziness: in setting of skipped meals, felt secondary to poor PO intake, AKI, anemia may be contributing as well. Telemetry/EKG without significant arrhythmia. Carotid US  showed moderate L carotid stenosis 50-69% which will need to be followed as OP. Defer to primary team if brain imaging needed.  4. Progressive CKD: Initially serum creatinine was 2 that worsened to 9 in 2024, same year and currently in the 5's  Not on dialysis. Per nephrology.   5. Paroxysmal A-fib: NSR, continue Eliquis  5 mg twice daily. Nephrology note does not report any concern about continuing. Also on this for hx of DVT per notes.  6. Anemia - Hgb last checked yesterday AM at 7.9, will defer management to primary team.  7. Carotid artery disease - 50-69% stenosis of the LEFT internal carotid artery, mild RIGHT carotid stenosis. Will need OP f/u for this. Not a good candidate for CTA given CKD at this time.  Remainder per primary.  For questions or updates, please contact [facility] Please consult [email or web address] for contact info under Cardiology/STEMI.  Signed, Mervil Wacker N Rayyan Burley, PA-C 05/07/2024, 8:56 AM

## 2024-05-08 DIAGNOSIS — N179 Acute kidney failure, unspecified: Secondary | ICD-10-CM | POA: Diagnosis not present

## 2024-05-08 DIAGNOSIS — R079 Chest pain, unspecified: Secondary | ICD-10-CM | POA: Diagnosis not present

## 2024-05-08 DIAGNOSIS — N184 Chronic kidney disease, stage 4 (severe): Secondary | ICD-10-CM | POA: Diagnosis not present

## 2024-05-08 DIAGNOSIS — I5032 Chronic diastolic (congestive) heart failure: Secondary | ICD-10-CM | POA: Diagnosis not present

## 2024-05-08 LAB — CBC
HCT: 23.5 % — ABNORMAL LOW (ref 36.0–46.0)
Hemoglobin: 7.5 g/dL — ABNORMAL LOW (ref 12.0–15.0)
MCH: 29.6 pg (ref 26.0–34.0)
MCHC: 31.9 g/dL (ref 30.0–36.0)
MCV: 92.9 fL (ref 80.0–100.0)
Platelets: 155 K/uL (ref 150–400)
RBC: 2.53 MIL/uL — ABNORMAL LOW (ref 3.87–5.11)
RDW: 16.1 % — ABNORMAL HIGH (ref 11.5–15.5)
WBC: 4.3 K/uL (ref 4.0–10.5)
nRBC: 0 % (ref 0.0–0.2)

## 2024-05-08 LAB — RENAL FUNCTION PANEL
Albumin: 3 g/dL — ABNORMAL LOW (ref 3.5–5.0)
Anion gap: 10 (ref 5–15)
BUN: 46 mg/dL — ABNORMAL HIGH (ref 8–23)
CO2: 21 mmol/L — ABNORMAL LOW (ref 22–32)
Calcium: 8.2 mg/dL — ABNORMAL LOW (ref 8.9–10.3)
Chloride: 108 mmol/L (ref 98–111)
Creatinine, Ser: 5.31 mg/dL — ABNORMAL HIGH (ref 0.44–1.00)
GFR, Estimated: 9 mL/min — ABNORMAL LOW (ref >60)
Glucose, Bld: 198 mg/dL — ABNORMAL HIGH (ref 70–99)
Phosphorus: 4.8 mg/dL — ABNORMAL HIGH (ref 2.5–4.6)
Potassium: 4.4 mmol/L (ref 3.5–5.1)
Sodium: 139 mmol/L (ref 135–145)

## 2024-05-08 LAB — GLUCOSE, CAPILLARY
Glucose-Capillary: 161 mg/dL — ABNORMAL HIGH (ref 70–99)
Glucose-Capillary: 151 mg/dL — ABNORMAL HIGH (ref 70–99)

## 2024-05-08 LAB — COMPLEMENT, TOTAL: Compl, Total (CH50): >60 U/mL (ref >41)

## 2024-05-08 MED ORDER — CYANOCOBALAMIN 500 MCG PO TABS
500.0000 ug | ORAL_TABLET | Freq: Every day | ORAL | Status: DC
Start: 2024-05-09 — End: 2024-06-26

## 2024-05-08 MED ORDER — ALPRAZOLAM 0.5 MG PO TABS
0.5000 mg | ORAL_TABLET | Freq: Once | ORAL | Status: AC
  Administered 2024-05-08: 0.5 mg via ORAL
  Filled 2024-05-08: qty 1

## 2024-05-08 MED ORDER — ISOSORBIDE MONONITRATE ER 60 MG PO TB24: 60.0000 mg | ORAL_TABLET | Freq: Every morning | ORAL | 1 refills | Status: DC

## 2024-05-08 NOTE — Discharge Summary (Signed)
 Physician Discharge Summary   Patient: [name] MRN: [id number] DOB: Jul 24, 1961  Admit date:     05/05/2024  Discharge date: 05/08/24  Discharge Physician: Alm Traves Majchrzak   PCP: System, Provider Not In   Recommendations at discharge:   Please follow up with primary care provider within 1-2 weeks  Please repeat BMP and CBC in one week    Hospital Course: 63 year old female with a history of CAD s/pDES-RCA 2022 ([place]) , hypertension, hyperlipidemia, T2DM, DVT on Eliquis , paroxysmal atrial fibrillation, bipolar disorder, CKD stage IV, and HFpEF presenting with chest pain and abdominal pain that began around 4 PM on 05/05/2024.  The patient was sitting on her couch watching television at the time.  She has not had any exertional chest pain.  She denies any fevers, chills, headache, neck pain, cough, hemoptysis.  She does have chronic dyspnea on exertion. She stated that she had some nausea and dry heaves on 05/05/2024, but this has improved.  She has not had any diarrhea, hematochezia, melena, dysuria, hematuria.  She denies any new medications.  She denies any NSAIDs.  She states that she was admitted to [facility] a little over a week prior to this admission.  She states that she only stayed in the hospital for 2 days, and she was treated for pneumonia and given 1 unit of PRBC.  In the ED, the patient was afebrile hemodynamically stable with oxygen  saturation 100% room air.  WBC 6.0, hemoglobin 8.9, platelets 200.  Sodium 136, potassium 4.9, bicarbonate 18, serum creatinine 5.57.  The patient was given 1 L of LR and morphine  4 mg x 1.  Troponin 4 >> 4.  EKG shows sinus rhythm with no concerning ST/T wave changes.  CT abdomen and pelvis was negative for any acute findings.  She was admitted for further evaluation and treatment of her acute kidney injury and chest pain.  The patient has had numerous hospitalizations. More recently, she was admitted to [facility] 12/13/2023 to 12/17/23.  At  that time, the patient had hemoglobin of 6.0.  She underwent EGD which showed gastric polyps which were felt to be the source of her anemia.  She was transfused 2 units PRBC.  During this hospital admission, her serum creatinine was noted to be 3.30 on 12/17/23     Assessment and Plan: Acute on chronic renal failure--CKD stage IV - Baseline creatinine 3.0-3.3 - Renal ultrasound--negative hydronephrosis - Nephrology consult appreciated--discussed with Dr. Jerrye - continue IV fluids judiciously - likely has progression of CKD - autoimmune antibodies neg (ANCA/ANA/ENA/SSA/SSB/RNP) - serum creatinine remains stable x 72 hours ~5.3 range - 8/23--discussed with renal, Dr. Maury to d/c and follow up with outpt nephrologist   Chest pain - Troponin 4>> 4 - atypical by history - 04/22/2023 echo EF 60 to 65%, normal RVF, trivial TR - 05/06/24 Echo EF 60-65%, no WMA, G1DD - Lexiscan  8/22 - continue imdur --pt had run out x 3 months - Lexiscan --small reversible perfusion defect with mild reduction in uptake present in the apical anterior and apex with normal wall motion in the defect area consistent with a combination of ischemia and artifact.  - 05/07/24--discussed with cardiology--ok from cardiac standpoint to d/c home   Paroxysmal atrial fibrillation - Currently in sinus rhythm - Continue apixaban  - Continue carvedilol    Chronic HFpEF - Clinically compensated - 04/22/2023 echo EF 60 to 65%, normal RVF, trivial TR - continue coreg    Coronary artery disease - Status post DES to the RCA 2022 in  [place]  -Patient had unstable angina 03/2023--LHC was deferred at that time secondary to her poor renal function - Continue carvedilol , Lipitor, Plavix , Imdur  - not on aspirin  due to Eliquis  use    Carotid stenosis -05/05/24 Carotid US  showed moderate L carotid stenosis 50-69% which will need to be followed as OP  -doubt this is clinically contributing to her dizziness   Anemia of CKD - iron  saturation 32 ferritin 289 - folate 289 - B12 172>>replete with IM x 1, then po - Baseline hemoglobin 8-9   Essential hypertension - Continue carvedilol  - Holding amlodipine  temporarily   Uncontrolled diabetes mellitus type 2 with hyperglycemia - Continue reduced dose Semglee  - 05/06/24 hemoglobin A1c--6.5 - NovoLog  sliding scale   Chronic constipation  - pt reports having diabetic neuropathy and chronic problems with constipation  - pt has been on regular laxatives and has had a bowel movement - 05/05/24 CT AP--neg for acute findings   Bipolar disorder, NOS - Continue home trazodone , lamictal , sertraline  --Review of the medical record shows that she is no longer taking these medications even as of her recent hospitalization at [facility]          Consultants: cardiology, renal Procedures performed: none  Disposition: Home Diet recommendation:  Cardiac and Carb modified diet DISCHARGE MEDICATION: Allergies as of 05/08/2024       Reactions   Gelatin Swelling   No jello of any kind   Iodinated Contrast Media Anaphylaxis, Hives, Itching, Swelling   Methylprednisolone  Sodium Succ Shortness Of Breath   Dicyclomine Hcl Anxiety, Other (See Comments)   shaky and sick GI upset, sick to stomach   Doxycycline Hives, Rash   rash   Glutamic Acid Itching, Swelling   Ketorolac Hives, Rash   Tolerated Toradol in the ED without side effect, hives, or complaint on 02/17/2022   Lisinopril  Swelling   Angioedema (facial, lip, or tongue swelling)   Metformin Nausea And Vomiting, Other (See Comments)   Dyspepsia sick to stomach It made me sick where I couldn't get out of the bed   Nitroglycerin  Nausea And Vomiting, Swelling   Pt states it makes her too sick   Nsaids Other (See Comments)   Stomach pain/bleeding   Other Itching, Swelling, Other (See Comments)   Solu-Medrol  Mix-O-Vial   Quetiapine Other (See Comments)   Vancomycin    Itching and erythema at  infusion site within a few minutes of starting infusion. No systemic evidence of Red Man Syndrome   Blueberry Flavoring Agent (non-screening) Nausea And Vomiting, Rash   Cephalexin Nausea And Vomiting, Nausea Only   sick to stomach   Fish Allergy Rash   Ibuprofen Nausea And Vomiting   GI upset Reports stomach upset 2/2 hx gastric polyps. Not true allergy.   Nitrofuran Derivatives Itching, Nausea And Vomiting   sick to stomach   Shellfish Allergy Itching, Swelling, Rash   Strawberry Flavoring Agent (non-screening) Nausea And Vomiting, Rash   Sulfa Antibiotics Nausea And Vomiting, Other (See Comments)   It just makes me real sick        Medication List     STOP taking these medications    amLODipine  10 MG tablet Commonly known as: NORVASC        TAKE these medications    albuterol  108 (90 Base) MCG/ACT inhaler Commonly known as: VENTOLIN  HFA Inhale 2 puffs into the lungs every 6 (six) hours as needed for wheezing or shortness of breath.   apixaban  5 MG Tabs tablet Commonly known as: Eliquis   Take 1 tablet (5 mg total) by mouth 2 (two) times daily.   atorvastatin  40 MG tablet Commonly known as: LIPITOR Take 40 mg by mouth every morning.   carvedilol  25 MG tablet Commonly known as: COREG  Take 0.5 tablets (12.5 mg total) by mouth 2 (two) times daily with a meal. What changed: when to take this   clonazePAM  1 MG tablet Commonly known as: KLONOPIN  Take 1 mg by mouth 2 (two) times daily.   clopidogrel  75 MG tablet Commonly known as: PLAVIX  Take 75 mg by mouth daily.   cyanocobalamin  500 MCG tablet Commonly known as: VITAMIN B12 Take 1 tablet (500 mcg total) by mouth daily. Start taking on: May 09, 2024   famotidine  20 MG tablet Commonly known as: PEPCID  Take 20 mg by mouth 2 (two) times daily.   gabapentin  300 MG capsule Commonly known as: NEURONTIN  Take 300 mg by mouth 3 (three) times daily.   isosorbide  mononitrate 60 MG 24 hr tablet Commonly known  as: IMDUR  Take 1 tablet (60 mg total) by mouth every morning. Start taking on: May 09, 2024   lamoTRIgine  100 MG tablet Commonly known as: LAMICTAL  Take 100 mg by mouth 2 (two) times daily.   Lantus  SoloStar 100 UNIT/ML Solostar Pen Generic drug: insulin  glargine Inject 50 Units into the skin 2 (two) times daily.   NovoLOG  FlexPen 100 UNIT/ML FlexPen Generic drug: insulin  aspart Inject 20 Units into the skin 4 (four) times daily. 4-5 times daily   pantoprazole  40 MG tablet Commonly known as: PROTONIX  Take 40 mg by mouth 2 (two) times daily before a meal.   promethazine  25 MG tablet Commonly known as: PHENERGAN  Take 25 mg by mouth every 6 (six) hours as needed for nausea or vomiting.   sertraline  100 MG tablet Commonly known as: ZOLOFT  Take 100 mg by mouth daily.   Symbicort 160-4.5 MCG/ACT inhaler Generic drug: budesonide-formoterol Inhale 2 puffs into the lungs daily at 6 (six) AM.        Discharge Exam: Filed Weights   05/06/24 1430  Weight: 104.1 kg   HEENT:  [place]/AT, No thrush, no icterus CV:  RRR, no rub, no S3, no S4 Lung:  CTA, no wheeze, no rhonchi Abd:  soft/+BS, NT Ext:  No edema, no lymphangitis, no synovitis, no rash   Condition at discharge: stable  The results of significant diagnostics from this hospitalization (including imaging, microbiology, ancillary and laboratory) are listed below for reference.   Imaging Studies: NM Myocar Multi W/Spect W/Wall Motion / EF Result Date: 05/07/2024   No ST deviation was noted. Pharmacological protocol was used.   LV perfusion is abnormal. There is a small reversible perfusion defect with mild reduction in uptake present in the apical anterior and apex with normal wall motion in the defect area consistent with a combination of ischemia and artifact. Inferior wall could not be evaluated for any ischemia due to significant adjacent extracardiac activity.   Left ventricular function is abnormal. Nuclear stress EF:  36%. Visually LVEF appears to be normal with mild inferior wall hypokinesis.   Findings are consistent with ischemia and no infarction. The study is high risk due to decreased LVEF. Correlate with Echocardiogram.   ECHOCARDIOGRAM COMPLETE Result Date: 05/06/2024    ECHOCARDIOGRAM REPORT   Patient Name:   [name] Date of Exam: 05/06/2024 Medical Rec #:  [id number]      Height:       63.0 in Accession #:    [id number]  Weight:       231.5 lb Date of Birth:  [birth year]      BSA:          2.058 m Patient Age:    62 years       BP:           109/48 mmHg Patient Gender: F              HR:           64 bpm. Exam Location:  [facility] Procedure: 2D Echo, Cardiac Doppler, Color Doppler and Intracardiac            Opacification Agent (Both Spectral and Color Flow Doppler were            utilized during procedure). Indications:    Syncope  History:        Patient has prior history of Echocardiogram examinations.                 Signs/Symptoms:Syncope; Risk Factors:Hypertension.  Sonographer:    Vella Key Referring Phys: [id number] OLADAPO ADEFESO IMPRESSIONS  1. No LV thrombus by Definity . Left ventricular ejection fraction, by estimation, is 60 to 65%. The left ventricle has normal function. The left ventricle has no regional wall motion abnormalities. There is mild left ventricular hypertrophy. Left ventricular diastolic parameters are consistent with Grade I diastolic dysfunction (impaired relaxation).  2. Right ventricular systolic function is normal. The right ventricular size is normal. Tricuspid regurgitation signal is inadequate for assessing PA pressure.  3. The mitral valve is normal in structure. Trivial mitral valve regurgitation. No evidence of mitral stenosis.  4. The aortic valve is tricuspid. Aortic valve regurgitation is not visualized. No aortic stenosis is present. Comparison(s): No significant change from prior study. FINDINGS  Left Ventricle: No LV thrombus by Definity . Left ventricular ejection  fraction, by estimation, is 60 to 65%. The left ventricle has normal function. The left ventricle has no regional wall motion abnormalities. Strain was performed and the global longitudinal strain is indeterminate. The left ventricular internal cavity size was normal in size. There is mild left ventricular hypertrophy. Left ventricular diastolic parameters are consistent with Grade I diastolic dysfunction (impaired relaxation).  Normal left ventricular filling pressure. Right Ventricle: The right ventricular size is normal. No increase in right ventricular wall thickness. Right ventricular systolic function is normal. Tricuspid regurgitation signal is inadequate for assessing PA pressure. Left Atrium: Left atrial size was normal in size. Right Atrium: Right atrial size was normal in size. Pericardium: There is no evidence of pericardial effusion. Mitral Valve: The mitral valve is normal in structure. Trivial mitral valve regurgitation. No evidence of mitral valve stenosis. Tricuspid Valve: The tricuspid valve is normal in structure. Tricuspid valve regurgitation is mild . No evidence of tricuspid stenosis. Aortic Valve: The aortic valve is tricuspid. Aortic valve regurgitation is not visualized. No aortic stenosis is present. Pulmonic Valve: The pulmonic valve was normal in structure. Pulmonic valve regurgitation is trivial. No evidence of pulmonic stenosis. Aorta: The aortic root and ascending aorta are structurally normal, with no evidence of dilitation. Venous: The inferior vena cava was not well visualized. IAS/Shunts: The interatrial septum was not well visualized. Additional Comments: 3D was performed not requiring image post processing on an independent workstation and was indeterminate.  LEFT VENTRICLE PLAX 2D LVIDd:         4.50 cm      Diastology LVIDs:         3.40 cm  LV e' medial:    7.18 cm/s LV PW:         1.20 cm      LV E/e' medial:  10.9 LV IVS:        1.20 cm      LV e' lateral:   8.32 cm/s  LVOT diam:     1.60 cm      LV E/e' lateral: 9.4 LV SV:         46 LV SV Index:   22 LVOT Area:     2.01 cm  LV Volumes (MOD) LV vol d, MOD A2C: 117.0 ml LV vol d, MOD A4C: 160.0 ml LV vol s, MOD A2C: 26.1 ml LV vol s, MOD A4C: 40.4 ml LV SV MOD A2C:     90.9 ml LV SV MOD A4C:     160.0 ml LV SV MOD BP:      104.9 ml RIGHT VENTRICLE RV Basal diam:  3.40 cm RV S prime:     11.80 cm/s TAPSE (M-mode): 3.2 cm LEFT ATRIUM             Index        RIGHT ATRIUM           Index LA diam:        3.50 cm 1.70 cm/m   RA Area:     15.10 cm LA Vol (A2C):   70.8 ml 34.41 ml/m  RA Volume:   35.20 ml  17.11 ml/m LA Vol (A4C):   46.9 ml 22.79 ml/m LA Biplane Vol: 59.7 ml 29.01 ml/m  AORTIC VALVE LVOT Vmax:   108.00 cm/s LVOT Vmean:  68.300 cm/s LVOT VTI:    0.229 m  AORTA Ao Root diam: 3.00 cm Ao Asc diam:  3.40 cm MITRAL VALVE MV Area (PHT): 4.31 cm    SHUNTS MV Decel Time: 176 msec    Systemic VTI:  0.23 m MV E velocity: 78.30 cm/s  Systemic Diam: 1.60 cm MV A velocity: 68.40 cm/s MV E/A ratio:  1.14 Vishnu Priya Mallipeddi Electronically signed by Diannah Late Mallipeddi Signature Date/Time: 05/06/2024/12:06:13 PM    Final    US  Carotid Bilateral Result Date: 05/06/2024 CLINICAL DATA:  Hypertension.  Syncope.  Hyperlipidemia.  Diabetes. EXAM: BILATERAL CAROTID DUPLEX ULTRASOUND TECHNIQUE: Elnor scale imaging, color Doppler and duplex ultrasound were performed of bilateral carotid and vertebral arteries in the neck. COMPARISON:  01/06/2006 FINDINGS: Criteria: Quantification of carotid stenosis is based on velocity parameters that correlate the residual internal carotid diameter with NASCET-based stenosis levels, using the diameter of the distal internal carotid lumen as the denominator for stenosis measurement. The following velocity measurements were obtained: RIGHT ICA: 109/27 cm/sec CCA: 97/18 cm/sec SYSTOLIC ICA/CCA RATIO:  1.1 ECA: 336 cm/sec LEFT ICA: 219/31 cm/sec CCA: 137/18 cm/sec SYSTOLIC ICA/CCA RATIO:  1.6 ECA:  170 cm/sec RIGHT CAROTID ARTERY: Mild calcified atheromatous plaque of the RIGHT carotid bulb. RIGHT VERTEBRAL ARTERY:  Antegrade flow. LEFT CAROTID ARTERY: Moderate long segment calcified plaque of the distal LEFT common carotid artery extending into the proximal internal carotid artery. LEFT VERTEBRAL ARTERY:  Antegrade flow. IMPRESSION: 1. Moderate long segment calcified plaque of the distal LEFT common carotid artery extending into the proximal internal carotid artery. Doppler measurements indicative of 50-69% stenosis of the LEFT internal carotid artery. 2. Mild calcified plaque of the RIGHT carotid bulb without significant stenosis. Electronically Signed   By: Aliene Lloyd M.D.   On: 05/06/2024 10:43   US  RENAL Result Date:  05/06/2024 CLINICAL DATA:  Acute on chronic renal failure. EXAM: RENAL / URINARY TRACT ULTRASOUND COMPLETE COMPARISON:  June 06, 2023. FINDINGS: Right Kidney: Renal measurements: 9.3 x 4.3 x 4.1 cm = volume: 88 mL. Echogenicity within normal limits. No mass or hydronephrosis visualized. Left Kidney: Renal measurements: 10.3 x 5.0 x 4.5 cm = volume: 123 mL. Echogenicity within normal limits. No mass or hydronephrosis visualized. Bladder: Appears normal for degree of bladder distention. Calculated prevoid volume of 150 mL. Other: None. Electronically Signed   By: Lynwood Landy Raddle M.D.   On: 05/06/2024 10:04   CT ABDOMEN PELVIS WO CONTRAST Result Date: 05/05/2024 CLINICAL DATA:  Abdominal pain EXAM: CT ABDOMEN AND PELVIS WITHOUT CONTRAST TECHNIQUE: Multidetector CT imaging of the abdomen and pelvis was performed following the standard protocol without IV contrast. RADIATION DOSE REDUCTION: This exam was performed according to the departmental dose-optimization program which includes automated exposure control, adjustment of the mA and/or kV according to patient size and/or use of iterative reconstruction technique. COMPARISON:  06/05/2023 FINDINGS: Lower chest: No acute findings.  Coronary artery and aortic atherosclerosis. Hepatobiliary: No focal liver abnormality is seen. Status post cholecystectomy. No biliary dilatation. Pancreas: No focal abnormality or ductal dilatation. Spleen: No focal abnormality.  Normal size. Adrenals/Urinary Tract: Punctate nonobstructing bilateral renal stones. No ureteral stones or hydronephrosis. Adrenal glands and urinary bladder unremarkable. Stomach/Bowel: Stomach, large and small bowel grossly unremarkable. Vascular/Lymphatic: No evidence of aneurysm or adenopathy. Aortic atherosclerosis. Reproductive: Prior hysterectomy.  No adnexal masses. Other: No free fluid or free air. Musculoskeletal: No acute bony abnormality. IMPRESSION: No acute findings in the abdomen or pelvis. Punctate bilateral nephrolithiasis.  No hydronephrosis. Coronary artery disease, aortic atherosclerosis. Electronically Signed   By: Franky Crease M.D.   On: 05/05/2024 23:01   DG Chest 2 View Result Date: 05/05/2024 CLINICAL DATA:  Chest and abdominal pain. EXAM: CHEST - 2 VIEW COMPARISON:  06/05/2023 and CT chest 04/15/2023. FINDINGS: Trachea is midline. Heart size normal. Lungs are clear. No pleural fluid. Flowing anterior osteophytosis in the thoracic spine. IMPRESSION: No acute findings. Electronically Signed   By: Newell Eke M.D.   On: 05/05/2024 17:58    Microbiology: Results for orders placed or performed during the hospital encounter of 06/04/23  Resp panel by RT-PCR (RSV, Flu A&B, Covid) Anterior Nasal Swab     Status: Abnormal   Collection Time: 06/04/23 10:40 PM   Specimen: Anterior Nasal Swab  Result Value Ref Range Status   SARS Coronavirus 2 by RT PCR POSITIVE (A) NEGATIVE Final    Comment: (NOTE) SARS-CoV-2 target nucleic acids are DETECTED.  The SARS-CoV-2 RNA is generally detectable in upper respiratory specimens during the acute phase of infection. Positive results are indicative of the presence of the identified virus, but do not rule out  bacterial infection or co-infection with other pathogens not detected by the test. Clinical correlation with patient history and other diagnostic information is necessary to determine patient infection status. The expected result is Negative.  Fact Sheet for Patients: BloggerCourse.com  Fact Sheet for Healthcare Providers: SeriousBroker.it  This test is not yet approved or cleared by the [place]  FDA and  has been authorized for detection and/or diagnosis of SARS-CoV-2 by FDA under an Emergency Use Authorization (EUA).  This EUA will remain in effect (meaning this test can be used) for the duration of  the COVID-19 declaration under Section 564(b)(1) of the A ct, 21 U.S.C. section 360bbb-3(b)(1), unless the authorization is terminated or revoked sooner.  Influenza A by PCR NEGATIVE NEGATIVE Final   Influenza B by PCR NEGATIVE NEGATIVE Final    Comment: (NOTE) The Xpert Xpress SARS-CoV-2/FLU/RSV plus assay is intended as an aid in the diagnosis of influenza from Nasopharyngeal swab specimens and should not be used as a sole basis for treatment. Nasal washings and aspirates are unacceptable for Xpert Xpress SARS-CoV-2/FLU/RSV testing.  Fact Sheet for Patients: BloggerCourse.com  Fact Sheet for Healthcare Providers: SeriousBroker.it  This test is not yet approved or cleared by the [place]  FDA and has been authorized for detection and/or diagnosis of SARS-CoV-2 by FDA under an Emergency Use Authorization (EUA). This EUA will remain in effect (meaning this test can be used) for the duration of the COVID-19 declaration under Section 564(b)(1) of the Act, 21 U.S.C. section 360bbb-3(b)(1), unless the authorization is terminated or revoked.     Resp Syncytial Virus by PCR NEGATIVE NEGATIVE Final    Comment: (NOTE) Fact Sheet for  Patients: BloggerCourse.com  Fact Sheet for Healthcare Providers: SeriousBroker.it  This test is not yet approved or cleared by the [place]  FDA and has been authorized for detection and/or diagnosis of SARS-CoV-2 by FDA under an Emergency Use Authorization (EUA). This EUA will remain in effect (meaning this test can be used) for the duration of the COVID-19 declaration under Section 564(b)(1) of the Act, 21 U.S.C. section 360bbb-3(b)(1), unless the authorization is terminated or revoked.  Performed at [facility], [address] [place], [place] [address]     Labs: CBC: Recent Labs  Lab 05/05/24 1927 05/06/24 0228 05/07/24 0428 05/08/24 0409  WBC 6.0 5.1 4.5 4.3  HGB 8.9* 7.9* 7.6* 7.5*  HCT 27.3* 23.8* 23.9* 23.5*  MCV 92.2 90.8 94.1 92.9  PLT 200 171 166 155   Basic Metabolic Panel: Recent Labs  Lab 05/05/24 1835 05/06/24 0228 05/07/24 0428 05/08/24 0409  NA 136 136 138 139  K 4.9 4.6 4.6 4.4  CL 105 106 107 108  CO2 18* 19* 22 21*  GLUCOSE 163* 126* 95 198*  BUN 45* 45* 45* 46*  CREATININE 5.57* 5.33* 5.35* 5.31*  CALCIUM  8.7* 8.6* 8.6* 8.2*  MG  --  1.3*  --   --   PHOS  --  5.4* 5.7* 4.8*   Liver Function Tests: Recent Labs  Lab 05/05/24 1936 05/06/24 0228 05/07/24 0428 05/08/24 0409  AST 11* 32  --   --   ALT 11 22  --   --   ALKPHOS 50 71  --   --   BILITOT 0.7 0.5  --   --   PROT 8.9* 8.1  --   --   ALBUMIN  3.3* 3.0* 3.0* 3.0*   CBG: Recent Labs  Lab 05/07/24 1129 05/07/24 1703 05/07/24 2008 05/07/24 2256 05/08/24 0734  GLUCAP 162* 173* 202* 170* 161*    Discharge time spent: greater than 30 minutes.  Signed: Alm Schneider, MD [facility] 05/08/2024

## 2024-05-08 NOTE — Progress Notes (Signed)
 At the beginning of the shift, during my assessment patient [name] noted to be swollen near Iv site. Knot present. Iv fluids stopped, iv removed.    Patient got a new iv via ultrasound in the R fa. Patient Marks noted to be swollen near iv site. Knot present. Iv fluids stopped. MD Manfred made aware.

## 2024-05-08 NOTE — Plan of Care (Signed)

## 2024-05-08 NOTE — Plan of Care (Signed)
  Problem: Education: Goal: Ability to describe self-care measures that may prevent or decrease complications (Diabetes Survival Skills Education) will improve Outcome: Progressing   Problem: Education: Goal: Knowledge of General Education information will improve Description: Including pain rating scale, medication(s)/side effects and non-pharmacologic comfort measures Outcome: Progressing   Problem: Coping: Goal: Level of anxiety will decrease Outcome: Not Progressing   Problem: Pain Managment: Goal: General experience of comfort will improve and/or be controlled Outcome: Progressing

## 2024-05-09 LAB — MULTIPLE MYELOMA PANEL, SERUM
Albumin SerPl Elph-Mcnc: 3.2 g/dL (ref 2.9–4.4)
Albumin/Glob SerPl: 0.8 (ref 0.7–1.7)
Alpha 1: 0.2 g/dL (ref 0.0–0.4)
Alpha2 Glob SerPl Elph-Mcnc: 0.9 g/dL (ref 0.4–1.0)
B-Globulin SerPl Elph-Mcnc: 0.8 g/dL (ref 0.7–1.3)
Gamma Glob SerPl Elph-Mcnc: 2.6 g/dL — ABNORMAL HIGH (ref 0.4–1.8)
Globulin, Total: 4.5 g/dL — ABNORMAL HIGH (ref 2.2–3.9)
IgA: 27 mg/dL — ABNORMAL LOW (ref 87–352)
IgG (Immunoglobin G), Serum: 2921 mg/dL — ABNORMAL HIGH (ref 586–1602)
IgM (Immunoglobulin M), Srm: 11 mg/dL — ABNORMAL LOW (ref 26–217)
M Protein SerPl Elph-Mcnc: 2.4 g/dL — ABNORMAL HIGH
Total Protein ELP: 7.7 g/dL (ref 6.0–8.5)

## 2024-05-18 ENCOUNTER — Other Ambulatory Visit: Payer: Self-pay

## 2024-05-18 ENCOUNTER — Emergency Department (HOSPITAL_COMMUNITY)
Admission: EM | Admit: 2024-05-18 | Discharge: 2024-05-18 | Disposition: A | Discharge status: Home / Self Care | Attending: Emergency Medicine | Admitting: Emergency Medicine

## 2024-05-18 ENCOUNTER — Emergency Department (HOSPITAL_COMMUNITY)

## 2024-05-18 ENCOUNTER — Encounter (HOSPITAL_COMMUNITY): Payer: Self-pay

## 2024-05-18 DIAGNOSIS — F439 Reaction to severe stress, unspecified: Secondary | ICD-10-CM | POA: Insufficient documentation

## 2024-05-18 DIAGNOSIS — Z7902 Long term (current) use of antithrombotics/antiplatelets: Secondary | ICD-10-CM | POA: Insufficient documentation

## 2024-05-18 DIAGNOSIS — Z794 Long term (current) use of insulin: Secondary | ICD-10-CM | POA: Insufficient documentation

## 2024-05-18 DIAGNOSIS — F419 Anxiety disorder, unspecified: Secondary | ICD-10-CM | POA: Diagnosis not present

## 2024-05-18 LAB — TROPONIN I (HIGH SENSITIVITY)
Troponin I (High Sensitivity): 3 ng/L (ref <18)
Troponin I (High Sensitivity): 4 ng/L (ref <18)

## 2024-05-18 LAB — CBC WITH DIFFERENTIAL/PLATELET
Abs Immature Granulocytes: 0.07 K/uL (ref 0.00–0.07)
Basophils Absolute: 0 K/uL (ref 0.0–0.1)
Basophils Relative: 1 %
Eosinophils Absolute: 0.1 K/uL (ref 0.0–0.5)
Eosinophils Relative: 2 %
HCT: 27.4 % — ABNORMAL LOW (ref 36.0–46.0)
Hemoglobin: 7.9 g/dL — ABNORMAL LOW (ref 12.0–15.0)
Immature Granulocytes: 1 %
Lymphocytes Relative: 26 %
Lymphs Abs: 1.5 K/uL (ref 0.7–4.0)
MCH: 29.7 pg (ref 26.0–34.0)
MCHC: 28.8 g/dL — ABNORMAL LOW (ref 30.0–36.0)
MCV: 103 fL — ABNORMAL HIGH (ref 80.0–100.0)
Monocytes Absolute: 0.5 K/uL (ref 0.1–1.0)
Monocytes Relative: 9 %
Neutro Abs: 3.4 K/uL (ref 1.7–7.7)
Neutrophils Relative %: 61 %
Platelets: 140 K/uL — ABNORMAL LOW (ref 150–400)
RBC: 2.66 MIL/uL — ABNORMAL LOW (ref 3.87–5.11)
RDW: 16.9 % — ABNORMAL HIGH (ref 11.5–15.5)
WBC: 5.6 K/uL (ref 4.0–10.5)
nRBC: 0 % (ref 0.0–0.2)

## 2024-05-18 LAB — COMPREHENSIVE METABOLIC PANEL WITH GFR
ALT: 9 U/L (ref 0–44)
AST: 14 U/L — ABNORMAL LOW (ref 15–41)
Albumin: 2.9 g/dL — ABNORMAL LOW (ref 3.5–5.0)
Alkaline Phosphatase: 55 U/L (ref 38–126)
Anion gap: 16 — ABNORMAL HIGH (ref 5–15)
BUN: 34 mg/dL — ABNORMAL HIGH (ref 8–23)
CO2: 13 mmol/L — ABNORMAL LOW (ref 22–32)
Calcium: 8.3 mg/dL — ABNORMAL LOW (ref 8.9–10.3)
Chloride: 109 mmol/L (ref 98–111)
Creatinine, Ser: 3.9 mg/dL — ABNORMAL HIGH (ref 0.44–1.00)
GFR, Estimated: 13 mL/min — ABNORMAL LOW (ref >60)
Glucose, Bld: 185 mg/dL — ABNORMAL HIGH (ref 70–99)
Potassium: 4.6 mmol/L (ref 3.5–5.1)
Sodium: 138 mmol/L (ref 135–145)
Total Bilirubin: 0.5 mg/dL (ref 0.0–1.2)
Total Protein: 8.4 g/dL — ABNORMAL HIGH (ref 6.5–8.1)

## 2024-05-18 LAB — LIPASE, BLOOD: Lipase: 48 U/L (ref 11–51)

## 2024-05-18 MED ORDER — ONDANSETRON HCL 4 MG/2ML IJ SOLN
4.0000 mg | Freq: Once | INTRAMUSCULAR | Status: AC
  Administered 2024-05-18: 4 mg via INTRAVENOUS
  Filled 2024-05-18: qty 2

## 2024-05-18 MED ORDER — CLONAZEPAM 0.5 MG PO TABS: 0.5000 mg | ORAL_TABLET | Freq: Two times a day (BID) | ORAL | 0 refills | Status: AC | PRN

## 2024-05-18 MED ORDER — HYDROMORPHONE HCL 1 MG/ML IJ SOLN
1.0000 mg | Freq: Once | INTRAMUSCULAR | Status: AC
  Administered 2024-05-18: 1 mg via INTRAVENOUS
  Filled 2024-05-18: qty 1

## 2024-05-18 NOTE — ED Triage Notes (Addendum)
 Pt c/o abdominal pain and chest pain x couple days ago. States that she is dizzy, weak and almost passed out. Endorses nausea.

## 2024-05-18 NOTE — ED Provider Notes (Signed)
 [place] EMERGENCY DEPARTMENT AT [facility] Provider Note   CSN: [id number] Arrival date & time: 05/18/24  9590     Patient presents with: Chest Pain and Dizziness   [name] is a 63 y.o. female.   Presents to the emergency department for evaluation of multiple problems.  Patient was hospitalized 2 weeks ago for chest pain and abdominal pain.  She had a cardiac evaluation during that time.  She was told that she has stage V kidney disease during that hospital stay, reports that she did not really know that she had kidney problems.  She has been extremely anxious and stressed about this since then.  She has not been able to follow-up with nephrology yet.  Patient complaining of back pain, abdominal pain, chest pain.  She is here tonight because she is having memory problems and dizziness, feels like she is going to pass out at times when she stands up.  Son is at the bedside, reports that he is concerned that she might be having strokes.       Prior to Admission medications   Medication Sig Start Date End Date Taking? Authorizing Provider  clonazePAM  (KLONOPIN ) 0.5 MG tablet Take 1 tablet (0.5 mg total) by mouth 2 (two) times daily as needed for anxiety. 05/18/24  Yes Cameo Shewell, Lonni PARAS, MD  albuterol  (VENTOLIN  HFA) 108 (90 Base) MCG/ACT inhaler Inhale 2 puffs into the lungs every 6 (six) hours as needed for wheezing or shortness of breath.    [provider]  apixaban  (ELIQUIS ) 5 MG TABS tablet Take 1 tablet (5 mg total) by mouth 2 (two) times daily. 11/17/17   Suzette Pac, MD  atorvastatin  (LIPITOR) 40 MG tablet Take 40 mg by mouth every morning.    [provider]  carvedilol  (COREG ) 25 MG tablet Take 0.5 tablets (12.5 mg total) by mouth 2 (two) times daily with a meal. Patient taking differently: Take 12.5 mg by mouth daily with breakfast. 05/03/23   Tobie Paticia BROCKS, MD  clopidogrel  (PLAVIX ) 75 MG tablet Take 75 mg by mouth daily.    [provider]  famotidine  (PEPCID ) 20 MG tablet Take 20 mg by mouth 2 (two) times daily.    [provider]  gabapentin  (NEURONTIN ) 300 MG capsule Take 300 mg by mouth 3 (three) times daily.    [provider]  isosorbide  mononitrate (IMDUR ) 60 MG 24 hr tablet Take 1 tablet (60 mg total) by mouth every morning. 05/09/24   Tat, Alm, MD  lamoTRIgine  (LAMICTAL ) 100 MG tablet Take 100 mg by mouth 2 (two) times daily.    [provider]  LANTUS  SOLOSTAR 100 UNIT/ML Solostar Pen Inject 50 Units into the skin 2 (two) times daily.    [provider]  NOVOLOG  FLEXPEN 100 UNIT/ML FlexPen Inject 20 Units into the skin 4 (four) times daily. 4-5 times daily 05/13/15   [provider]  pantoprazole  (PROTONIX ) 40 MG tablet Take 40 mg by mouth 2 (two) times daily before a meal.    [provider]  promethazine  (PHENERGAN ) 25 MG tablet Take 25 mg by mouth every 6 (six) hours as needed for nausea or vomiting.    [provider]  sertraline  (ZOLOFT ) 100 MG tablet Take 100 mg by mouth daily.    [provider]  SYMBICORT 160-4.5 MCG/ACT inhaler Inhale 2 puffs into the lungs daily at 6 (six) AM. 02/03/24   [provider]  vitamin B-12 (VITAMIN B12) 500 MCG tablet  Take 1 tablet (500 mcg total) by mouth daily. 05/09/24   Evonnie Lenis, MD    Allergies: Gelatin, Iodinated contrast media, Methylprednisolone  sodium succ, Dicyclomine hcl, Doxycycline, Glutamic acid, Ketorolac, Lisinopril , Metformin, Nitroglycerin , Nsaids, Other, Quetiapine, Vancomycin, Blueberry flavoring agent (non-screening), Cephalexin, Fish allergy, Ibuprofen, Nitrofuran derivatives, Shellfish allergy, Strawberry flavoring agent (non-screening), and Sulfa antibiotics    Review of Systems  Updated Vital Signs BP (!) 186/74   Pulse 83   Temp 98.1 F (36.7 C)   Resp 15   SpO2 98%   Physical Exam Vitals and nursing note reviewed.  Constitutional:      General: She is not  in acute distress.    Appearance: She is well-developed.  HENT:     Head: Normocephalic and atraumatic.     Mouth/Throat:     Mouth: Mucous membranes are moist.  Eyes:     General: Vision grossly intact. Gaze aligned appropriately.     Extraocular Movements: Extraocular movements intact.     Conjunctiva/sclera: Conjunctivae normal.  Cardiovascular:     Rate and Rhythm: Normal rate and regular rhythm.     Pulses: Normal pulses.     Heart sounds: Normal heart sounds, S1 normal and S2 normal. No murmur heard.    No friction rub. No gallop.  Pulmonary:     Effort: Pulmonary effort is normal. No respiratory distress.     Breath sounds: Normal breath sounds.  Abdominal:     General: Bowel sounds are normal.     Palpations: Abdomen is soft.     Tenderness: There is no abdominal tenderness. There is no guarding or rebound.     Hernia: No hernia is present.  Musculoskeletal:        General: No swelling.     Cervical back: Full passive range of motion without pain, normal range of motion and neck supple. No spinous process tenderness or muscular tenderness. Normal range of motion.     Right lower leg: No edema.     Left lower leg: No edema.  Skin:    General: Skin is warm and dry.     Capillary Refill: Capillary refill takes less than 2 seconds.     Findings: No ecchymosis, erythema, rash or wound.  Neurological:     General: No focal deficit present.     Mental Status: She is alert and oriented to person, place, and time.     GCS: GCS eye subscore is 4. GCS verbal subscore is 5. GCS motor subscore is 6.     Cranial Nerves: Cranial nerves 2-12 are intact.     Sensory: Sensation is intact.     Motor: Motor function is intact.     Coordination: Coordination is intact.  Psychiatric:        Attention and Perception: Attention normal.        Mood and Affect: Mood normal.        Speech: Speech normal.        Behavior: Behavior normal.     (all labs ordered are listed, but only  abnormal results are displayed) Labs Reviewed  CBC WITH DIFFERENTIAL/PLATELET - Abnormal; Notable for the following components:      Result Value   RBC 2.66 (*)    Hemoglobin 7.9 (*)    HCT 27.4 (*)    MCV 103.0 (*)    MCHC 28.8 (*)    RDW 16.9 (*)    Platelets 140 (*)    All other components within normal limits  COMPREHENSIVE  METABOLIC PANEL WITH GFR - Abnormal; Notable for the following components:   CO2 13 (*)    Glucose, Bld 185 (*)    BUN 34 (*)    Creatinine, Ser 3.90 (*)    Calcium  8.3 (*)    Total Protein 8.4 (*)    Albumin  2.9 (*)    AST 14 (*)    GFR, Estimated 13 (*)    Anion gap 16 (*)    All other components within normal limits  LIPASE, BLOOD  URINALYSIS, ROUTINE W REFLEX MICROSCOPIC  TROPONIN I (HIGH SENSITIVITY)  TROPONIN I (HIGH SENSITIVITY)    EKG: EKG Interpretation Date/Time:  Tuesday May 18 2024 04:19:29 EDT Ventricular Rate:  77 PR Interval:  154 QRS Duration:  98 QT Interval:  401 QTC Calculation: 454 R Axis:   50  Text Interpretation: Sinus rhythm Normal ECG Confirmed by Haze Lonni PARAS [phone]) on 05/18/2024 4:21:13 AM  Radiology: CT HEAD WO CONTRAST ( ) Result Date: 05/18/2024 EXAM: CT HEAD WITHOUT CONTRAST 05/18/2024 04:56:22 AM TECHNIQUE: CT of the head was performed without the administration of intravenous contrast. Automated exposure control, iterative reconstruction, and/or weight based adjustment of the mA/kV was utilized to reduce the radiation dose to as low as reasonably achievable. COMPARISON: CT of the head dated 04/25/2023. CLINICAL HISTORY: Memory loss; Mental status change, unknown cause. States that she is dizzy, weak and almost passed out. Endorses nausea. FINDINGS: BRAIN AND VENTRICLES: No acute hemorrhage. No evidence of acute infarct. No hydrocephalus. No extra-axial collection. No mass effect or midline shift. Calcifications within the carotid siphons and vertebral arteries. ORBITS: No acute abnormality. SINUSES:  Polypoid mucosal disease within the floors of the maxillary sinuses. Leftward deviation of the nasal septum. SOFT TISSUES AND SKULL: No acute soft tissue abnormality. No skull fracture. IMPRESSION: 1. No acute intracranial abnormality. 2. Polypoid mucosal disease within the floors of the maxillary sinuses and leftward deviation of the nasal septum. Electronically signed by: Evalene Coho MD 05/18/2024 04:59 AM EDT RP Workstation: [id number]     Procedures   Medications Ordered in the ED  HYDROmorphone  (DILAUDID ) injection 1 mg (1 mg Intravenous Given 05/18/24 0601)  ondansetron  (ZOFRAN ) injection 4 mg (4 mg Intravenous Given 05/18/24 0601)                                    Medical Decision Making Amount and/or Complexity of Data Reviewed External Data Reviewed: labs, radiology, ECG and notes. Labs: ordered. Decision-making details documented in ED Course. Radiology: ordered and independent interpretation performed. Decision-making details documented in ED Course. ECG/medicine tests: ordered and independent interpretation performed. Decision-making details documented in ED Course.  Risk Prescription drug management.   Differential diagnosis considered includes, but not limited to: Stroke; vertigo; angina; NSTEMI; STEMI; anemia  Patient presents to the emergency department with multiple complaints.  She has a complicated medical history.  Patient continues to complain of chest pain abdominal pain, back pain.  These are somewhat chronic in nature.  She was hospitalized within the last 2 weeks for same and workup did not reveal obvious cause.  She ruled out for heart attack.  She was found to have elevated creatinine.  She has baseline renal insufficiency.  Patient reports that she had been pretending that her kidneys are not failing for years and that when she was in the hospital this past time she realized that she could not do that anymore.  Since leaving  the hospital she has not been  eating or drinking, has had an increase in her stress, anxiety.  She is not sleeping.  I believe these are causal for a lot of her symptoms today.  No focal findings on exam to suggest stroke.  CT head unremarkable.  Pressure elevated on arrival, this improved with analgesia.  She is due for her morning meds.  Troponins once again negative.  She has follow-up scheduled with cardiology.  She also has follow-up scheduled with nephrology.  She is relieved to know that her creatinine is better today.  Patient needs to follow-up with primary care for long-term treatment and management of her anxiety and stress but will provide with limited supply of Xanax .     Final diagnoses:  Stress  Anxiety    ED Discharge Orders          Ordered    clonazePAM  (KLONOPIN ) 0.5 MG tablet  2 times daily PRN        05/18/24 0631               Haze Lonni PARAS, MD 05/18/24 [phone]

## 2024-05-27 ENCOUNTER — Emergency Department (HOSPITAL_COMMUNITY)
Admission: EM | Admit: 2024-05-27 | Discharge: 2024-05-28 | Disposition: A | Discharge status: Home / Self Care | Attending: Emergency Medicine | Admitting: Emergency Medicine

## 2024-05-27 ENCOUNTER — Encounter (HOSPITAL_COMMUNITY): Payer: Self-pay

## 2024-05-27 ENCOUNTER — Other Ambulatory Visit: Payer: Self-pay

## 2024-05-27 DIAGNOSIS — I120 Hypertensive chronic kidney disease with stage 5 chronic kidney disease or end stage renal disease: Secondary | ICD-10-CM | POA: Diagnosis not present

## 2024-05-27 DIAGNOSIS — N390 Urinary tract infection, site not specified: Secondary | ICD-10-CM | POA: Diagnosis not present

## 2024-05-27 DIAGNOSIS — R5383 Other fatigue: Secondary | ICD-10-CM

## 2024-05-27 DIAGNOSIS — N185 Chronic kidney disease, stage 5: Secondary | ICD-10-CM | POA: Diagnosis not present

## 2024-05-27 DIAGNOSIS — E1122 Type 2 diabetes mellitus with diabetic chronic kidney disease: Secondary | ICD-10-CM | POA: Diagnosis not present

## 2024-05-27 DIAGNOSIS — R109 Unspecified abdominal pain: Secondary | ICD-10-CM | POA: Diagnosis present

## 2024-05-27 NOTE — ED Triage Notes (Signed)
 Pov from home. Cc of flank pain. Decreased appetite. Off and on for a while Diarrhea yesterday + today. 3x today.   Was recently dx with  stage V kidney failure. Hasn't completely felt good since she was here last.

## 2024-05-27 NOTE — ED Provider Notes (Signed)
 [name] EMERGENCY DEPARTMENT AT [facility] Provider Note   CSN: [id number] Arrival date & time: 05/27/24  2250     History Chief Complaint  Patient presents with   Multiple Complaints    HPI [name] is a 63 y.o. female presenting for chief complaint of near syncope. Reports diarrhea 3-4 pd NBNB Very poor po intake. 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 BL Flank pain Patient's recorded medical, surgical, social, medication list and allergies were reviewed in the Snapshot window as part of the initial history.   Review of Systems   Review of Systems  Constitutional:  Positive for fatigue. Negative for chills and fever.  HENT:  Negative for ear pain and sore throat.   Eyes:  Negative for pain and visual disturbance.  Respiratory:  Negative for cough and shortness of breath.   Cardiovascular:  Negative for chest pain and palpitations.  Gastrointestinal:  Positive for abdominal pain, diarrhea and nausea. Negative for abdominal distention, constipation and vomiting.  Genitourinary:  Negative for dysuria and hematuria.  Musculoskeletal:  Negative for arthralgias and back pain.  Skin:  Negative for color change and rash.  Neurological:  Negative for seizures and syncope.  All other systems reviewed and are negative.   Physical Exam Updated Vital Signs BP (!) 163/67   Pulse 77   Temp 98.2 F (36.8 C)   Resp 16   Ht 5' 3 (1.6 m)   Wt 104.1 kg   SpO2 97%   BMI 40.65 kg/m  Physical Exam Vitals and nursing note reviewed.  Constitutional:      General: She is not in acute distress.    Appearance: She is well-developed.  HENT:     Head: Normocephalic and atraumatic.  Eyes:     Conjunctiva/sclera: Conjunctivae normal.  Cardiovascular:     Rate and Rhythm: Normal rate and regular rhythm.     Heart sounds: No murmur heard. Pulmonary:     Effort: Pulmonary effort is normal. No respiratory distress.     Breath sounds: Normal  breath sounds.  Abdominal:     Palpations: Abdomen is soft.     Tenderness: There is no abdominal tenderness.  Musculoskeletal:        General: No swelling.     Cervical back: Neck supple.  Skin:    General: Skin is warm and dry.     Capillary Refill: Capillary refill takes less than 2 seconds.  Neurological:     Mental Status: She is alert.  Psychiatric:        Mood and Affect: Mood normal.      ED Course/ Medical Decision Making/ A&P    Procedures Procedures   Medications Ordered in ED Medications  cephALEXin  (KEFLEX ) capsule 500 mg (500 mg Oral Given 05/28/24 0354)  lactated ringers  bolus 1,000 mL (0 mLs Intravenous Stopped 05/28/24 0200)  ondansetron  (ZOFRAN ) injection 4 mg (4 mg Intravenous Given 05/28/24 0051)  oxyCODONE  (Oxy IR/ROXICODONE ) immediate release tablet 10 mg (10 mg Oral Given 05/28/24 0051)  ondansetron  (ZOFRAN ) injection 4 mg (4 mg Intravenous Given 05/28/24 0354)  oxyCODONE  (Oxy IR/ROXICODONE ) immediate release tablet 10 mg (10 mg Oral Given 05/28/24 0354)   Medical Decision Making:   Elena Davia is a 63 y.o. female who presented to the ED today with abdominal pain, detailed above.    Patient placed on continuous vitals and telemetry monitoring while in ED which was reviewed periodically.  Complete initial physical exam  performed, notably the patient  was HDS in NAD.     Reviewed and confirmed nursing documentation for past medical history, family history, social history.    Initial Assessment:   With the patient's presentation of abdominal pain, most likely diagnosis is nonspecific etiology. Other diagnoses were considered including (but not limited to) gastroenteritis, colitis, small bowel obstruction, appendicitis, cholecystitis, pancreatitis, nephrolithiasis, UTI, pyleonephritis These are considered less likely due to history of present illness and physical exam findings.   This is most consistent with an acute life/limb threatening illness complicated  by underlying chronic conditions.   Initial Plan:  CBC/CMP to evaluate for underlying infectious/metabolic etiology for patient's abdominal pain  Lipase to evaluate for pancreatitis  CTAB/Pelvis to evaluate for structural/surgical etiology of patients' severe abdominal pain.  Urinalysis and repeat physical assessment to evaluate for UTI/Pyelonpehritis  Empiric management of symptoms with escalating pain control and antiemetics as needed.   Initial Study Results:   Laboratory  All laboratory results reviewed without evidence of clinically relevant pathology.   Exceptions include: Bacteriuria, leukocyturia  Radiology All images reviewed independently. Agree with radiology report at this time.   CT ABDOMEN PELVIS WO CONTRAST Result Date: 05/28/2024 EXAM: CT ABDOMEN AND PELVIS WITHOUT CONTRAST 05/28/2024 02:36:16 AM TECHNIQUE: CT of the abdomen and pelvis was performed without the administration of intravenous contrast. Multiplanar reformatted images are provided for review. Automated exposure control, iterative reconstruction, and/or weight-based adjustment of the mA/kV was utilized to reduce the radiation dose to as low as reasonably achievable. COMPARISON: CT abdomen and pelvis 05/05/2024. CLINICAL HISTORY: Abdominal pain, acute, nonlocalized. Cc of flank pain. Decreased appetite. Off and on for a while; Diarrhea yesterday + today. 3x today. 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 Hasn't completely felt good since she was here last. FINDINGS: LOWER CHEST: No acute abnormality. LIVER: The liver is unremarkable. GALLBLADDER AND BILE DUCTS: Cholecystectomy. SPLEEN: No acute abnormality. PANCREAS: No acute abnormality. ADRENAL GLANDS: No acute abnormality. KIDNEYS, URETERS AND BLADDER: Punctate nonobstructing bilateral nephrolithiasis. No hydronephrosis. No perinephric or periureteral stranding. Urinary bladder is unremarkable. GI AND BOWEL: Stomach demonstrates no acute abnormality. There is no  bowel obstruction. PERITONEUM AND RETROPERITONEUM: No ascites. No free air. VASCULATURE: Aortic atherosclerotic calcification. LYMPH NODES: No lymphadenopathy. REPRODUCTIVE ORGANS: Hysterectomy. BONES AND SOFT TISSUES: No acute osseous abnormality. No focal soft tissue abnormality. IMPRESSION: 1. No acute findings in the abdomen or pelvis. 2. Punctate nonobstructing bilateral nephrolithiasis. Electronically signed by: Norman Gatlin MD 05/28/2024 02:44 AM EDT RP Workstation: [id number]   CT HEAD WO CONTRAST ( ) Result Date: 05/18/2024 EXAM: CT HEAD WITHOUT CONTRAST 05/18/2024 04:56:22 AM TECHNIQUE: CT of the head was performed without the administration of intravenous contrast. Automated exposure control, iterative reconstruction, and/or weight based adjustment of the mA/kV was utilized to reduce the radiation dose to as low as reasonably achievable. COMPARISON: CT of the head dated 04/25/2023. CLINICAL HISTORY: Memory loss; Mental status change, unknown cause. States that she is dizzy, weak and almost passed out. Endorses nausea. FINDINGS: BRAIN AND VENTRICLES: No acute hemorrhage. No evidence of acute infarct. No hydrocephalus. No extra-axial collection. No mass effect or midline shift. Calcifications within the carotid siphons and vertebral arteries. ORBITS: No acute abnormality. SINUSES: Polypoid mucosal disease within the floors of the maxillary sinuses. Leftward deviation of the nasal septum. SOFT TISSUES AND SKULL: No acute soft tissue abnormality. No skull fracture. IMPRESSION: 1. No acute intracranial abnormality. 2. Polypoid mucosal disease within the floors of the maxillary sinuses and leftward deviation of the nasal  septum. Electronically signed by: Evalene Coho MD 05/18/2024 04:59 AM EDT RP Workstation: [id number]   NM Myocar Multi W/Spect Marisela Motion / EF Result Date: 05/07/2024   No ST deviation was noted. Pharmacological protocol was used.   LV perfusion is abnormal. There is a small  reversible perfusion defect with mild reduction in uptake present in the apical anterior and apex with normal wall motion in the defect area consistent with a combination of ischemia and artifact. Inferior wall could not be evaluated for any ischemia due to significant adjacent extracardiac activity.   Left ventricular function is abnormal. Nuclear stress EF: 36%. Visually LVEF appears to be normal with mild inferior wall hypokinesis.   Findings are consistent with ischemia and no infarction. The study is high risk due to decreased LVEF. Correlate with Echocardiogram.   ECHOCARDIOGRAM COMPLETE Result Date: 05/06/2024    ECHOCARDIOGRAM REPORT   Patient Name:   AMEENAH PROSSER [place] Date of Exam: 05/06/2024 Medical Rec #:  [id number]      Height:       63.0 in Accession #:    [id number]     Weight:       231.5 lb Date of Birth:  Jan 31, 1961      BSA:          2.058 m Patient Age:    62 years       BP:           109/48 mmHg Patient Gender: F              HR:           64 bpm. Exam Location:  [facility] Procedure: 2D Echo, Cardiac Doppler, Color Doppler and Intracardiac            Opacification Agent (Both Spectral and Color Flow Doppler were            utilized during procedure). Indications:    Syncope  History:        Patient has prior history of Echocardiogram examinations.                 Signs/Symptoms:Syncope; Risk Factors:Hypertension.  Sonographer:    Vella Key Referring Phys: [id number] OLADAPO ADEFESO IMPRESSIONS  1. No LV thrombus by Definity . Left ventricular ejection fraction, by estimation, is 60 to 65%. The left ventricle has normal function. The left ventricle has no regional wall motion abnormalities. There is mild left ventricular hypertrophy. Left ventricular diastolic parameters are consistent with Grade I diastolic dysfunction (impaired relaxation).  2. Right ventricular systolic function is normal. The right ventricular size is normal. Tricuspid regurgitation signal is inadequate for assessing PA pressure.   3. The mitral valve is normal in structure. Trivial mitral valve regurgitation. No evidence of mitral stenosis.  4. The aortic valve is tricuspid. Aortic valve regurgitation is not visualized. No aortic stenosis is present. Comparison(s): No significant change from prior study. FINDINGS  Left Ventricle: No LV thrombus by Definity . Left ventricular ejection fraction, by estimation, is 60 to 65%. The left ventricle has normal function. The left ventricle has no regional wall motion abnormalities. Strain was performed and the global longitudinal strain is indeterminate. The left ventricular internal cavity size was normal in size. There is mild left ventricular hypertrophy. Left ventricular diastolic parameters are consistent with Grade I diastolic dysfunction (impaired relaxation).  Normal left ventricular filling pressure. Right Ventricle: The right ventricular size is normal. No increase in right ventricular wall thickness. Right ventricular systolic function is normal. Tricuspid regurgitation  signal is inadequate for assessing PA pressure. Left Atrium: Left atrial size was normal in size. Right Atrium: Right atrial size was normal in size. Pericardium: There is no evidence of pericardial effusion. Mitral Valve: The mitral valve is normal in structure. Trivial mitral valve regurgitation. No evidence of mitral valve stenosis. Tricuspid Valve: The tricuspid valve is normal in structure. Tricuspid valve regurgitation is mild . No evidence of tricuspid stenosis. Aortic Valve: The aortic valve is tricuspid. Aortic valve regurgitation is not visualized. No aortic stenosis is present. Pulmonic Valve: The pulmonic valve was normal in structure. Pulmonic valve regurgitation is trivial. No evidence of pulmonic stenosis. Aorta: The aortic root and ascending aorta are structurally normal, with no evidence of dilitation. Venous: The inferior vena cava was not well visualized. IAS/Shunts: The interatrial septum was not well  visualized. Additional Comments: 3D was performed not requiring image post processing on an independent workstation and was indeterminate.  LEFT VENTRICLE PLAX 2D LVIDd:         4.50 cm      Diastology LVIDs:         3.40 cm      LV e' medial:    7.18 cm/s LV PW:         1.20 cm      LV E/e' medial:  10.9 LV IVS:        1.20 cm      LV e' lateral:   8.32 cm/s LVOT diam:     1.60 cm      LV E/e' lateral: 9.4 LV SV:         46 LV SV Index:   22 LVOT Area:     2.01 cm  LV Volumes (MOD) LV vol d, MOD A2C: 117.0 ml LV vol d, MOD A4C: 160.0 ml LV vol s, MOD A2C: 26.1 ml LV vol s, MOD A4C: 40.4 ml LV SV MOD A2C:     90.9 ml LV SV MOD A4C:     160.0 ml LV SV MOD BP:      104.9 ml RIGHT VENTRICLE RV Basal diam:  3.40 cm RV S prime:     11.80 cm/s TAPSE (M-mode): 3.2 cm LEFT ATRIUM             Index        RIGHT ATRIUM           Index LA diam:        3.50 cm 1.70 cm/m   RA Area:     15.10 cm LA Vol (A2C):   70.8 ml 34.41 ml/m  RA Volume:   35.20 ml  17.11 ml/m LA Vol (A4C):   46.9 ml 22.79 ml/m LA Biplane Vol: 59.7 ml 29.01 ml/m  AORTIC VALVE LVOT Vmax:   108.00 cm/s LVOT Vmean:  68.300 cm/s LVOT VTI:    0.229 m  AORTA Ao Root diam: 3.00 cm Ao Asc diam:  3.40 cm MITRAL VALVE MV Area (PHT): 4.31 cm    SHUNTS MV Decel Time: 176 msec    Systemic VTI:  0.23 m MV E velocity: 78.30 cm/s  Systemic Diam: 1.60 cm MV A velocity: 68.40 cm/s MV E/A ratio:  1.14 Vishnu Priya Mallipeddi Electronically signed by Diannah Late Mallipeddi Signature Date/Time: 05/06/2024/12:06:13 PM    Final    US  Carotid Bilateral Result Date: 05/06/2024 CLINICAL DATA:  Hypertension.  Syncope.  Hyperlipidemia.  Diabetes. EXAM: BILATERAL CAROTID DUPLEX ULTRASOUND TECHNIQUE: Elnor scale imaging, color Doppler and duplex ultrasound were performed of  bilateral carotid and vertebral arteries in the neck. COMPARISON:  01/06/2006 FINDINGS: Criteria: Quantification of carotid stenosis is based on velocity parameters that correlate the residual internal  carotid diameter with NASCET-based stenosis levels, using the diameter of the distal internal carotid lumen as the denominator for stenosis measurement. The following velocity measurements were obtained: RIGHT ICA: 109/27 cm/sec CCA: 97/18 cm/sec SYSTOLIC ICA/CCA RATIO:  1.1 ECA: 336 cm/sec LEFT ICA: 219/31 cm/sec CCA: 137/18 cm/sec SYSTOLIC ICA/CCA RATIO:  1.6 ECA: 170 cm/sec RIGHT CAROTID ARTERY: Mild calcified atheromatous plaque of the RIGHT carotid bulb. RIGHT VERTEBRAL ARTERY:  Antegrade flow. LEFT CAROTID ARTERY: Moderate long segment calcified plaque of the distal LEFT common carotid artery extending into the proximal internal carotid artery. LEFT VERTEBRAL ARTERY:  Antegrade flow. IMPRESSION: 1. Moderate long segment calcified plaque of the distal LEFT common carotid artery extending into the proximal internal carotid artery. Doppler measurements indicative of 50-69% stenosis of the LEFT internal carotid artery. 2. Mild calcified plaque of the RIGHT carotid bulb without significant stenosis. Electronically Signed   By: Aliene Lloyd M.D.   On: 05/06/2024 10:43   US  RENAL Result Date: 05/06/2024 CLINICAL DATA:  Acute on chronic renal failure. EXAM: RENAL / URINARY TRACT ULTRASOUND COMPLETE COMPARISON:  June 06, 2023. FINDINGS: Right Kidney: Renal measurements: 9.3 x 4.3 x 4.1 cm = volume: 88 mL. Echogenicity within normal limits. No mass or hydronephrosis visualized. Left Kidney: Renal measurements: 10.3 x 5.0 x 4.5 cm = volume: 123 mL. Echogenicity within normal limits. No mass or hydronephrosis visualized. Bladder: Appears normal for degree of bladder distention. Calculated prevoid volume of 150 mL. Other: None. Electronically Signed   By: Lynwood Landy Raddle M.D.   On: 05/06/2024 10:04   CT ABDOMEN PELVIS WO CONTRAST Result Date: 05/05/2024 CLINICAL DATA:  Abdominal pain EXAM: CT ABDOMEN AND PELVIS WITHOUT CONTRAST TECHNIQUE: Multidetector CT imaging of the abdomen and pelvis was performed following  the standard protocol without IV contrast. RADIATION DOSE REDUCTION: This exam was performed according to the departmental dose-optimization program which includes automated exposure control, adjustment of the mA and/or kV according to patient size and/or use of iterative reconstruction technique. COMPARISON:  06/05/2023 FINDINGS: Lower chest: No acute findings. Coronary artery and aortic atherosclerosis. Hepatobiliary: No focal liver abnormality is seen. Status post cholecystectomy. No biliary dilatation. Pancreas: No focal abnormality or ductal dilatation. Spleen: No focal abnormality.  Normal size. Adrenals/Urinary Tract: Punctate nonobstructing bilateral renal stones. No ureteral stones or hydronephrosis. Adrenal glands and urinary bladder unremarkable. Stomach/Bowel: Stomach, large and small bowel grossly unremarkable. Vascular/Lymphatic: No evidence of aneurysm or adenopathy. Aortic atherosclerosis. Reproductive: Prior hysterectomy.  No adnexal masses. Other: No free fluid or free air. Musculoskeletal: No acute bony abnormality. IMPRESSION: No acute findings in the abdomen or pelvis. Punctate bilateral nephrolithiasis.  No hydronephrosis. Coronary artery disease, aortic atherosclerosis. Electronically Signed   By: Franky Crease M.D.   On: 05/05/2024 23:01   DG Chest 2 View Result Date: 05/05/2024 CLINICAL DATA:  Chest and abdominal pain. EXAM: CHEST - 2 VIEW COMPARISON:  06/05/2023 and CT chest 04/15/2023. FINDINGS: Trachea is midline. Heart size normal. Lungs are clear. No pleural fluid. Flowing anterior osteophytosis in the thoracic spine. IMPRESSION: No acute findings. Electronically Signed   By: Newell Eke M.D.   On: 05/05/2024 17:58    Final Reassessment and Plan:   Patient's lab work grossly nondiagnostic.  Uremia continues to worsen which may be the etiology of her fatigue syndrome.  She follows up with nephrology next  week for conversations around initiation of dialysis.  She was educated on  aggressive hydration and was treated in the emergency room to help manage symptoms.  She does have some bacteriuria and is describing some urinary tract infection-like syndrome with frequency and discomfort.  Patient would like to be started on antimicrobials due to the symptoms.  Urine culture sent to pharmacy and patient started on Keflex  pending this report. Patient ambulatory tolerating p.o. intake and feels improved after IV fluids here in the ER, she feels comfortable outpatient care management follow-up with her PCP/nephrologist.  Disposition:  I have considered need for hospitalization, however, considering all of the above, I believe this patient is stable for discharge at this time.  Patient/family educated about specific return precautions for given chief complaint and symptoms.  Patient/family educated about follow-up with PCP.     Patient/family expressed understanding of return precautions and need for follow-up. Patient spoken to regarding all imaging and laboratory results and appropriate follow up for these results. All education provided in verbal form with additional information in written form. Time was allowed for answering of patient questions. Patient discharged.    Emergency Department Medication Summary:   Medications  cephALEXin  (KEFLEX ) capsule 500 mg (500 mg Oral Given 05/28/24 0354)  lactated ringers  bolus 1,000 mL (0 mLs Intravenous Stopped 05/28/24 0200)  ondansetron  (ZOFRAN ) injection 4 mg (4 mg Intravenous Given 05/28/24 0051)  oxyCODONE  (Oxy IR/ROXICODONE ) immediate release tablet 10 mg (10 mg Oral Given 05/28/24 0051)  ondansetron  (ZOFRAN ) injection 4 mg (4 mg Intravenous Given 05/28/24 0354)  oxyCODONE  (Oxy IR/ROXICODONE ) immediate release tablet 10 mg (10 mg Oral Given 05/28/24 0354)             Clinical Impression:  1. Other fatigue   2. Urinary tract infection without hematuria, site unspecified      Discharge   Final Clinical Impression(s) / ED  Diagnoses Final diagnoses:  Other fatigue  Urinary tract infection without hematuria, site unspecified    Rx / DC Orders ED Discharge Orders          Ordered    cephALEXin  (KEFLEX ) 500 MG capsule  3 times daily        05/28/24 0345    ondansetron  (ZOFRAN ) 4 MG tablet  Every 6 hours PRN        05/28/24 0346              Jerral Meth, MD 05/28/24 0518

## 2024-05-28 ENCOUNTER — Emergency Department (HOSPITAL_COMMUNITY)

## 2024-05-28 LAB — LIPASE, BLOOD: Lipase: 53 U/L — ABNORMAL HIGH (ref 11–51)

## 2024-05-28 LAB — CBC WITH DIFFERENTIAL/PLATELET
Abs Immature Granulocytes: 0.04 K/uL (ref 0.00–0.07)
Basophils Absolute: 0 K/uL (ref 0.0–0.1)
Basophils Relative: 0 %
Eosinophils Absolute: 0.2 K/uL (ref 0.0–0.5)
Eosinophils Relative: 3 %
HCT: 24.3 % — ABNORMAL LOW (ref 36.0–46.0)
Hemoglobin: 8.1 g/dL — ABNORMAL LOW (ref 12.0–15.0)
Immature Granulocytes: 1 %
Lymphocytes Relative: 21 %
Lymphs Abs: 1.2 K/uL (ref 0.7–4.0)
MCH: 30.6 pg (ref 26.0–34.0)
MCHC: 33.3 g/dL (ref 30.0–36.0)
MCV: 91.7 fL (ref 80.0–100.0)
Monocytes Absolute: 0.4 K/uL (ref 0.1–1.0)
Monocytes Relative: 7 %
Neutro Abs: 3.9 K/uL (ref 1.7–7.7)
Neutrophils Relative %: 68 %
Platelets: 168 K/uL (ref 150–400)
RBC: 2.65 MIL/uL — ABNORMAL LOW (ref 3.87–5.11)
RDW: 16.7 % — ABNORMAL HIGH (ref 11.5–15.5)
WBC: 5.8 K/uL (ref 4.0–10.5)
nRBC: 0 % (ref 0.0–0.2)

## 2024-05-28 LAB — COMPREHENSIVE METABOLIC PANEL WITH GFR
ALT: 13 U/L (ref 0–44)
AST: 14 U/L — ABNORMAL LOW (ref 15–41)
Albumin: 3.4 g/dL — ABNORMAL LOW (ref 3.5–5.0)
Alkaline Phosphatase: 52 U/L (ref 38–126)
Anion gap: 14 (ref 5–15)
BUN: 39 mg/dL — ABNORMAL HIGH (ref 8–23)
CO2: 20 mmol/L — ABNORMAL LOW (ref 22–32)
Calcium: 9 mg/dL (ref 8.9–10.3)
Chloride: 100 mmol/L (ref 98–111)
Creatinine, Ser: 3.68 mg/dL — ABNORMAL HIGH (ref 0.44–1.00)
GFR, Estimated: 13 mL/min — ABNORMAL LOW
Glucose, Bld: 224 mg/dL — ABNORMAL HIGH (ref 70–99)
Potassium: 4.3 mmol/L (ref 3.5–5.1)
Sodium: 134 mmol/L — ABNORMAL LOW (ref 135–145)
Total Bilirubin: 0.8 mg/dL (ref 0.0–1.2)
Total Protein: 8.9 g/dL — ABNORMAL HIGH (ref 6.5–8.1)

## 2024-05-28 LAB — URINALYSIS, ROUTINE W REFLEX MICROSCOPIC
Bilirubin Urine: NEGATIVE
Glucose, UA: NEGATIVE mg/dL
Ketones, ur: NEGATIVE mg/dL
Nitrite: NEGATIVE
Protein, ur: 30 mg/dL — AB
Specific Gravity, Urine: 1.005 (ref 1.005–1.030)
pH: 6 (ref 5.0–8.0)

## 2024-05-28 MED ORDER — OXYCODONE HCL 5 MG PO TABS
10.0000 mg | ORAL_TABLET | Freq: Once | ORAL | Status: AC
  Administered 2024-05-28: 10 mg via ORAL
  Filled 2024-05-28: qty 2

## 2024-05-28 MED ORDER — ONDANSETRON HCL 4 MG PO TABS: 4.0000 mg | ORAL_TABLET | Freq: Four times a day (QID) | ORAL | 0 refills | Status: DC | PRN

## 2024-05-28 MED ORDER — CEPHALEXIN 500 MG PO CAPS
500.0000 mg | ORAL_CAPSULE | Freq: Three times a day (TID) | ORAL | Status: DC
  Administered 2024-05-28: 500 mg via ORAL
  Filled 2024-05-28: qty 1

## 2024-05-28 MED ORDER — ONDANSETRON HCL 4 MG/2ML IJ SOLN
4.0000 mg | Freq: Once | INTRAMUSCULAR | Status: AC
  Administered 2024-05-28: 4 mg via INTRAVENOUS
  Filled 2024-05-28: qty 2

## 2024-05-28 MED ORDER — CEPHALEXIN 500 MG PO CAPS: 500.0000 mg | ORAL_CAPSULE | Freq: Three times a day (TID) | ORAL | 0 refills | Status: DC

## 2024-05-28 MED ORDER — LACTATED RINGERS IV BOLUS
1000.0000 mL | Freq: Once | INTRAVENOUS | Status: AC
  Administered 2024-05-28: 1000 mL via INTRAVENOUS

## 2024-05-31 NOTE — Progress Notes (Signed)
 [facility] Office Visit  Patient Name: [name] Date of Birth:  September 29, 1960, 63 y.o. Female Medical Record # [id number] Date:   06/01/2024  Assessment & Plan 1. Stage 5 chronic kidney disease (HCC)   2. Hypertension   3. Type 2 diabetes mellitus with diabetic chronic kidney disease (HCC)   4. Anemia in chronic kidney disease   5. Other bipolar disorder (HCC)   6. Iron deficiency anemia, not otherwise specified     CKD due to Type 2 DM/HTN -Advised patient to maintain blood pressure/glucose control, adequate hydration and avoid nephrotoxic medications to preserve renal function.  -discussed poor renal function, care everywhere reviewed revealing longstanding CKD with continued progression over the last few years, will need dialysis in future, will need to start preparations for dialysis -apparently patient has history of not going to f/up appts, she is now willing to see below specialist and family is going to help her get to appts  -refer to hematology- significant transfusion depdence recently, history of IDA/GI bleeding -refer to treatment options -refer to psych- patient feels her anxiety is contributing to a lot of her current symptoms, has hx of bipolar disorder, has not seen any psych since moving to this area a few years ago -refer to GI- abnormal endoscopy, IDA - hx of CAD with increase in DOE and orthopnea, has cardio f/up with Dr Murray planned for next month, denies any chest pain -increase fluid intake, goal 50 oz per day -discussed uremic signs and symptoms  - labs today, RTC in 6 weeks, discuss access planning at that time   Orders Placed This Encounter  . Vitamin D 25 Hydroxy  . Renal Function Panel  . PTH, Intact  . CBC  . Urine Albumin  / Creatinine Ratio  . Iron Panel (Fe, TIBC, TSAT)  . Ambulatory referral to Renal Treatment Options Education  . Ambulatory referral to Psychiatry  . Ambulatory referral to Gastroenterology  . Ambulatory referral  to Hematology / Oncology  . POCT urinalysis dipstick    Return in about 6 weeks (around 07/12/2024).    Chief Complaint: CKD Follow-up History of Present Illness [name] is a 63 y.o. female with bipolar disorder, insulin  dependent DM, HTN, and CKD.   She has family member with her today, he is going to help her start getting to more f.up appts.  Patient reports last diarrhea was wed/thurs.  Moved to this area from [place]  a few years ago, did not find out about renal failure until the last two months, hx of bipolar disorder, this diagnosis is causing her significant stress.  She reports poor appetite, nausea, no vomiting, this is chronic, present for many years, numerous appts with GI in tx, has not seen GI locally. Apparently [facility] wanted her to go to [place], unclear why, however she is unable to do this She reports fatigue, weakness and nausea today She is eating very little, drinking ~52ml per day. Denies any vomiting  ED visit on 9/11, third visit at [facility] for abd pain/diarrhea/nausea, advised to f/up with nephrology, concern for symptomatic uremia  Seen by Dr jacobo while inpt at [facility] in August, PNA/hypertensive urgency   Hospitalization 05/08/24 at [facility]  63 year old female with a history of CAD s/pDES-RCA 2022 ([place]) , hypertension, hyperlipidemia, T2DM, DVT on Eliquis , paroxysmal atrial fibrillation, bipolar disorder, CKD stage IV, and HFpEF presenting with chest pain and abdominal pain that began around 4 PM on 05/05/2024.  The patient was sitting on her couch watching television  at the time.  She has not had any exertional chest pain.  She denies any fevers, chills, headache, neck pain, cough, hemoptysis.  She does have chronic dyspnea on exertion. She stated that she had some nausea and dry heaves on 05/05/2024, but this has improved.  She has not had any diarrhea, hematochezia, melena, dysuria, hematuria.  She denies any new medications.  She denies any NSAIDs.   She  states that she was admitted to [facility] a little over a week prior to this admission.  She states that she only stayed in the hospital for 2 days, and she was treated for pneumonia and given 1 unit of PRBC.   In the ED, the patient was afebrile hemodynamically stable with oxygen  saturation 100% room air.  WBC 6.0, hemoglobin 8.9, platelets 200.  Sodium 136, potassium 4.9, bicarbonate 18, serum creatinine 5.57.  The patient was given 1 L of LR and morphine  4 mg x 1.  Troponin 4 >> 4.  EKG shows sinus rhythm with no concerning ST/T wave changes.  CT abdomen and pelvis was negative for any acute findings.  She was admitted for further evaluation and treatment of her acute kidney injury and chest pain.   The patient has had numerous hospitalizations. More recently, she was admitted to [facility] 12/13/2023 to 12/17/23.  At that time, the patient had hemoglobin of 6.0.  She underwent EGD which showed gastric polyps which were felt to be the source of her anemia.  She was transfused 2 units PRBC.  During this hospital admission, her serum creatinine was noted to be 3.30 on 12/17/23  Most recent labs 05/28/24- BUN 39, Creat 3.68, BUN 39, bicarb 20, na 134, K 4.3, alb 3.4, egfr 13, hgb 8.1, MCV 91.7  The following portions of the patient's chart were reviewed in this encounter and updated as appropriate:  Tobacco  Allergies  Meds  Problems  Med Hx  Surg Hx  Fam Hx      Family History  Problem Relation Age of Onset  . Diabetes Mother   . Stroke Mother   . Hypertension Father   . Heart disease Father   . Stroke Sister   . Heart disease Sister   . Diabetes Sister   . Diabetes Sister   . Heart disease Brother   . Diabetes Brother   . Heart disease Brother   . Cancer Brother        liver     History reviewed. No pertinent surgical history.   Social History   Tobacco Use  . Smoking status: Never  . Smokeless tobacco: Never  Substance Use Topics  . Alcohol use: Not on file     Active Ambulatory Problems    Diagnosis Date Noted  . Acute on chronic heart failure co-occurrent with normal ejection fraction (HCC) 04/22/2023  . Acute-on-chronic renal failure (HCC) 04/16/2023  . Iron deficiency anemia 04/22/2023  . Anxiety 04/30/2022  . Bipolar affective disorder, current episode depression (HCC) 12/31/2023  . Diabetic peripheral neuropathy (HCC) 04/30/2022  . Esophageal reflux 10/21/2006  . Essential hypertension 10/21/2006  . History of deep vein thrombosis 05/26/2024  . Hyperlipidemia 04/30/2022  . Hyponatremia 05/02/2023  . Myocardial infarction (HCC) 04/30/2022  . Stage 5 chronic kidney disease (HCC) 06/01/2024  . Type 2 diabetes mellitus with diabetic chronic kidney disease (HCC) 06/01/2024  . Anemia in chronic kidney disease 06/01/2024  . Other bipolar disorder (HCC) 06/01/2024   Resolved Ambulatory Problems    Diagnosis Date Noted  .  No Resolved Ambulatory Problems   No Additional Past Medical History      Review of Systems  Constitutional:  Positive for malaise/fatigue. Negative for chills and fever.  HENT:  Negative for hoarse voice, nosebleeds and sore throat.   Eyes:  Positive for impaired. Negative for blurred vision and double vision.  Respiratory:  Negative for cough, sputum production and shortness of breath.   Cardiovascular:  Negative for chest pain, palpitations, orthopnea and leg swelling.  Gastrointestinal:  Positive for diarrhea, nausea and poor appetite. Negative for abdominal pain, blood in stool, melena and vomiting.  Genitourinary:  Negative for dysuria, frequency, hematuria and nocturia.  Musculoskeletal:  Negative for myalgias and muscle cramps.  Skin:  Negative for rash and wound.  Neurological:  Negative for dizziness and weakness.  Endo/Heme/Allergies:  Negative for polydipsia. Does not bruise/bleed easily.  Psychiatric/Behavioral:  The patient is nervous/anxious.     Medication List Current Outpatient Medications   Medication Sig Dispense Refill  . atorvastatin  (LIPITOR) 40 MG tablet Take 40 mg by mouth 1 (one) time each day    . clonazePAM  (KlonoPIN ) 0.5 MG tablet 0.5 mg 2 (two) times a day if needed    . isosorbide  mononitrate (IMDUR ) 60 MG 24 hr tablet Take 60 mg by mouth 1 (one) time each day    . ondansetron  (ZOFRAN ) 4 MG tablet Take 4 mg by mouth every 8 (eight) hours if needed    . amLODIPine  (NORVASC ) 10 MG tablet Take 10 mg by mouth 1 (one) time each day    . carvedilol  (COREG ) 25 MG tablet 25 mg in the morning and 25 mg in the evening. Take with meals.    . clopidogrel  (PLAVIX ) 75 MG tablet 75 mg 1 (one) time each day    . Eliquis  5 MG tablet Take 5 mg by mouth in the morning and 5 mg in the evening.    . famotidine  (PEPCID ) 20 MG tablet Take 20 mg by mouth in the morning and 20 mg in the evening.    . gabapentin  (NEURONTIN ) 300 MG capsule 300 mg in the morning and 300 mg in the evening and 300 mg before bedtime.    . lamoTRIgine  (LaMICtal ) 100 MG tablet Take 100 mg by mouth 1 (one) time each day    . Lantus  SoloStar 100 UNIT/ML injection Inject 50 Units under the skin in the morning and 50 Units in the evening.    . pantoprazole  (PROTONIX ) 40 MG EC tablet Take 40 mg by mouth in the morning and 40 mg in the evening.    . sertraline  (ZOLOFT ) 100 MG tablet Take 100 mg by mouth 1 (one) time each day     No current facility-administered medications for this visit.     Allergy List Allergies  Allergen Reactions  . Methylprednisolone  Sodium Succ Shortness of breath  . Ketorolac Hives and Rash    Tolerated Toradol in the ED without side effect, hives, or complaint on 02/17/2022  . Lisinopril  Other (see comments) and Swelling    Angioedema (facial, lip, or tongue swelling)  . Metformin Nausea And Vomiting and Other (see comments)    Dyspepsia  sick to stomach  It made me sick where I couldn't get out of the bed  . Nitroglycerin  Nausea And Vomiting and Swelling    Pt states it makes her too  sick  . Iodinated Contrast Media Other (see comments)  . Nsaids Other (see comments)  . Quetiapine Other (see comments)    Seroquel  .  Vancomycin     Itching and erythema at infusion site within a few minutes of starting infusion. No systemic evidence of Red Man Syndrome  . Ibuprofen Nausea And Vomiting    GI upset  Reports stomach upset 2/2 hx gastric polyps. Not true allergy.  . Nitrofuran Derivatives Itching and Nausea And Vomiting    sick to stomach  . Shellfish Allergy Itching, Rash and Swelling  . Strawberry Flavoring Agent (Non-Screening) Nausea And Vomiting and Rash  . Sulfa Antibiotics Nausea And Vomiting and Other (see comments)    It just makes me real sick    Physical Exam Vitals:   05/31/24 1557  BP: 125/71  Pulse: 71  Temp: 96.5 F  Weight: 230 lb 6.4 oz (105 kg)  Height: 5' 3 (1.6 m)    Vitals reviewed. Constitutional: She is oriented to person, place, and time. She appears well-developed and well-nourished. No distress.  HEENT:  Head: Normocephalic.  Eyes: Conjunctivae are normal. Pupils are equal, round, and reactive to light. No scleral icterus.  Cardiovascular:  Normal rate, regular rhythm and normal heart sounds.      Exam reveals no gallop and no friction rub.      No murmur heard.She exhibits no edema.  Pulmonary/Chest: Effort normal and breath sounds normal. No respiratory distress. She has no wheezes. She has no rales.  Neurological: She is alert and oriented to person, place, and time.  Skin: Skin is warm and dry. She is not diaphoretic.  Psychiatric: Her behavior is normal. Her mood appears anxious.    Labs     No lab exists for component: LABALBU      No lab exists for component: PTHINTACT       Lab Results  Component Value Date   BLOODURINE Trace 05/31/2024   PHUR 6.0 05/31/2024   GLUCOSEU Negative 05/31/2024   KETONESU Negative 05/31/2024   PROTEINUR 1+ 05/31/2024   UROBILINOGEN 0.2 05/31/2024   LEUKOCYTESU 1+ 05/31/2024    NITRITEU Negative 05/31/2024        No lab exists for component: IRON SATURATION   No LOS data to display   Please note this report has been produced using speech recognition software and may contain errors related to that system including errors in grammar, punctuation, spelling, and possibly words and phrases that may be inappropriate.  If there are any questions or concerns, please feel free to contact the dictating provider for clarification.    Katelyn Holder, PA-C

## 2024-06-01 NOTE — Progress Notes (Signed)
 Hey I called patient with her labs I need to get her set up to start ESA, they use procrit in [place]? Lets start her at 10,000 units weekly, she has needed multiple transfusions recently Also sent you a msg about multiple referrals for her

## 2024-06-08 NOTE — Progress Notes (Signed)
 [facility] Treatment Options Education  Patient Name: [name] Date of Birth:  09-27-1960, 63 y.o. Female Medical Record # [id number] Date:   06/08/2024  Kidney Care:  365  Understanding CKD:  Reviewed stages of CKD, ESRD, CKD diet recommendations, and symptoms of CKD.   Coping with CKD:  Tips for coping with CKD and building a support network were reviewed.    Choosing a Treatment Option:  Information was given on all treatment options and encouraged the patient to discuss with their physician their best options for treatment.   Kidney Care Resources:    [x]   Provided [email or web address] for their KidneyCare:365 class.   [x]   Provided the KidneyCare:365 Education Booklet for the KidneyCare:365 class.  Hemodialysis (in-center):  Discussed treatment regimen and accesses for this modality.  Peritoneal Dialysis:  Discussed training, follow up appointments after training, home visits, treatment regimen, needs of the home, and access for this modality.    Home Hemodialysis:   Discussed training, follow up appointments after training, home visits, treatment regimen, needs of the home, and accesses for this modality.  Discussed that [facility] requires additional safety measures and approval from their physician and Home Dialysis Team.   Nocturnal Hemodialysis (in-center):  Reviewed that select clinics in our area provide treatments in the late evening.  The benefits of longer hemodialysis treatments were discussed.   Transplant:  Discussed the transplant option and encouraged patient to speak with their physician to determine if this is an option for them.   Choosing no Treatment:  Discussed that supportive care treats the discomfort of ESRD, rather than treating to prolong life.  AVF:  Creation, healing time, usage, and care were all discussed.  AVG:  The differences in the AVG and AVF were discussed.  Creation, healing time, usage, and care were all  discussed.   Hemodialysis Catheter:  Placement of the catheter, use, and care were all discussed. Reviewed that this access type is generally considered a temporary access until a functioning PD catheter, AVF, or AVG are in use.    PD Catheter:  Placement of the catheter, use, and care were all discussed.    Treatment Preference:  Undecided  Coping:  Adequate coping  CKD Insurance Coordinator:  Yes   Comments:  Pt attended class at [facility] clinic with son. Pt and son very quiet during class with no questions or indication of modality chosen. [facility] to f/u in 1 week since pt is CKD 5. JLW

## 2024-06-22 NOTE — Progress Notes (Deleted)
 Cardiology Office Note    Date:  06/22/2024  ID:  [name], DOB 23-Jun-1961, MRN [id number] Cardiologist: Diannah SHAUNNA Maywood, MD { :  History of Present Illness:    [name] is a 63 y.o. female with past medical history of CAD (s/p PCI of RCA in [place], [place]  in 2022), chrinic HFpEF, HTN, HLD, Type 2 DM, history of DVT, paroxysmal atrial fibrillation, anemia and Stage 4-5 CKD who presents to the office today for hospital follow-up.   She was most recently admitted to [facility] in 04/2024 for evaluation of chest pain and symptoms were overall felt to be atypical for a cardiac etiology.  Troponin values remain negative and echocardiogram showed a preserved EF with mild LVH and grade 1 diastolic dysfunction. Given her renal function, a Lexiscan  Myoview  was performed and showed possible ischemia in the apical anterior wall and apex was also noted to have artifact. She was not taking Imdur  at home and was recommended to resume this with close follow-up within 1 month. Her baseline creatinine was around 3.0 - 3.3 but acutely elevated at ~ 5.5 during admission. Nephrology followed the patient during that time and she did not require HD.   ROS: ***  Studies Reviewed:   EKG: EKG is*** ordered today and demonstrates ***   EKG Interpretation Date/Time:    Ventricular Rate:    PR Interval:    QRS Duration:    QT Interval:    QTC Calculation:   R Axis:      Text Interpretation:         Carotid Dopplers: 04/2024 IMPRESSION: 1. Moderate long segment calcified plaque of the distal LEFT common carotid artery extending into the proximal internal carotid artery. Doppler measurements indicative of 50-69% stenosis of the LEFT internal carotid artery. 2. Mild calcified plaque of the RIGHT carotid bulb without significant stenosis.  Echocardiogram: 04/2024 IMPRESSIONS     1. No LV thrombus by Definity . Left ventricular ejection fraction, by  estimation, is 60 to 65%. The left  ventricle has normal function. The left  ventricle has no regional wall motion abnormalities. There is mild left  ventricular hypertrophy. Left  ventricular diastolic parameters are consistent with Grade I diastolic  dysfunction (impaired relaxation).   2. Right ventricular systolic function is normal. The right ventricular  size is normal. Tricuspid regurgitation signal is inadequate for assessing  PA pressure.   3. The mitral valve is normal in structure. Trivial mitral valve  regurgitation. No evidence of mitral stenosis.   4. The aortic valve is tricuspid. Aortic valve regurgitation is not  visualized. No aortic stenosis is present.   Comparison(s): No significant change from prior study.    NST: 04/2024   No ST deviation was noted. Pharmacological protocol was used.   LV perfusion is abnormal. There is a small reversible perfusion defect with mild reduction in uptake present in the apical anterior and apex with normal wall motion in the defect area consistent with a combination of ischemia and artifact. Inferior wall could not be evaluated for any ischemia due to significant adjacent extracardiac activity.   Left ventricular function is abnormal. Nuclear stress EF: 36%. Visually LVEF appears to be normal with mild inferior wall hypokinesis.   Findings are consistent with ischemia and no infarction. The study is high risk due to decreased LVEF. Correlate with Echocardiogram.     Risk Assessment/Calculations:   {Does this patient have ATRIAL FIBRILLATION?:[id number]} No BP recorded.  {Refresh Note OR Click here to  enter BP  :1}***         Physical Exam:   VS:  There were no vitals taken for this visit.   Wt Readings from Last 3 Encounters:  05/27/24 229 lb 8 oz (104.1 kg)  05/06/24 229 lb 8 oz (104.1 kg)  06/05/23 231 lb 7.7 oz (105 kg)     GEN: Well nourished, well developed in no acute distress NECK: No JVD; No carotid bruits CARDIAC: ***RRR, no murmurs, rubs,  gallops RESPIRATORY:  Clear to auscultation without rales, wheezing or rhonchi  ABDOMEN: Appears non-distended. No obvious abdominal masses. EXTREMITIES: No clubbing or cyanosis. No edema.  Distal pedal pulses are 2+ bilaterally.   Assessment and Plan:      {Are you ordering a CV Procedure (e.g. stress test, cath, DCCV, TEE, etc)?   Press F2        :[id number]}   Signed, Laymon CHRISTELLA Qua, PA-C

## 2024-06-23 ENCOUNTER — Ambulatory Visit: Attending: Student | Admitting: Student

## 2024-06-23 DIAGNOSIS — I6523 Occlusion and stenosis of bilateral carotid arteries: Secondary | ICD-10-CM

## 2024-06-23 DIAGNOSIS — I2510 Atherosclerotic heart disease of native coronary artery without angina pectoris: Secondary | ICD-10-CM

## 2024-06-23 DIAGNOSIS — I480 Paroxysmal atrial fibrillation: Secondary | ICD-10-CM

## 2024-06-25 ENCOUNTER — Inpatient Hospital Stay (HOSPITAL_COMMUNITY)
Admission: EM | Admit: 2024-06-25 | Discharge: 2024-07-04 | DRG: 302 | Disposition: A | Discharge status: Home / Self Care | Attending: Internal Medicine | Admitting: Internal Medicine

## 2024-06-25 DIAGNOSIS — I132 Hypertensive heart and chronic kidney disease with heart failure and with stage 5 chronic kidney disease, or end stage renal disease: Secondary | ICD-10-CM | POA: Diagnosis present

## 2024-06-25 DIAGNOSIS — K3184 Gastroparesis: Secondary | ICD-10-CM | POA: Diagnosis present

## 2024-06-25 DIAGNOSIS — K760 Fatty (change of) liver, not elsewhere classified: Secondary | ICD-10-CM | POA: Diagnosis present

## 2024-06-25 DIAGNOSIS — T45525A Adverse effect of antithrombotic drugs, initial encounter: Secondary | ICD-10-CM | POA: Diagnosis not present

## 2024-06-25 DIAGNOSIS — K766 Portal hypertension: Secondary | ICD-10-CM | POA: Diagnosis present

## 2024-06-25 DIAGNOSIS — E1122 Type 2 diabetes mellitus with diabetic chronic kidney disease: Secondary | ICD-10-CM | POA: Diagnosis present

## 2024-06-25 DIAGNOSIS — Z6841 Body Mass Index (BMI) 40.0 and over, adult: Secondary | ICD-10-CM

## 2024-06-25 DIAGNOSIS — Z888 Allergy status to other drugs, medicaments and biological substances status: Secondary | ICD-10-CM

## 2024-06-25 DIAGNOSIS — K529 Noninfective gastroenteritis and colitis, unspecified: Secondary | ICD-10-CM | POA: Diagnosis present

## 2024-06-25 DIAGNOSIS — Z90721 Acquired absence of ovaries, unilateral: Secondary | ICD-10-CM

## 2024-06-25 DIAGNOSIS — Z91013 Allergy to seafood: Secondary | ICD-10-CM

## 2024-06-25 DIAGNOSIS — Z882 Allergy status to sulfonamides status: Secondary | ICD-10-CM

## 2024-06-25 DIAGNOSIS — D631 Anemia in chronic kidney disease: Secondary | ICD-10-CM | POA: Diagnosis present

## 2024-06-25 DIAGNOSIS — R079 Chest pain, unspecified: Secondary | ICD-10-CM | POA: Diagnosis present

## 2024-06-25 DIAGNOSIS — Z7901 Long term (current) use of anticoagulants: Secondary | ICD-10-CM

## 2024-06-25 DIAGNOSIS — Z7722 Contact with and (suspected) exposure to environmental tobacco smoke (acute) (chronic): Secondary | ICD-10-CM | POA: Diagnosis present

## 2024-06-25 DIAGNOSIS — I2510 Atherosclerotic heart disease of native coronary artery without angina pectoris: Secondary | ICD-10-CM | POA: Diagnosis present

## 2024-06-25 DIAGNOSIS — Z7951 Long term (current) use of inhaled steroids: Secondary | ICD-10-CM

## 2024-06-25 DIAGNOSIS — I252 Old myocardial infarction: Secondary | ICD-10-CM

## 2024-06-25 DIAGNOSIS — T788XXA Other adverse effects, not elsewhere classified, initial encounter: Secondary | ICD-10-CM | POA: Diagnosis not present

## 2024-06-25 DIAGNOSIS — Z992 Dependence on renal dialysis: Secondary | ICD-10-CM

## 2024-06-25 DIAGNOSIS — Z79899 Other long term (current) drug therapy: Secondary | ICD-10-CM

## 2024-06-25 DIAGNOSIS — I10 Essential (primary) hypertension: Secondary | ICD-10-CM | POA: Diagnosis present

## 2024-06-25 DIAGNOSIS — N186 End stage renal disease: Secondary | ICD-10-CM | POA: Diagnosis present

## 2024-06-25 DIAGNOSIS — R072 Precordial pain: Principal | ICD-10-CM

## 2024-06-25 DIAGNOSIS — Z90710 Acquired absence of both cervix and uterus: Secondary | ICD-10-CM

## 2024-06-25 DIAGNOSIS — Y849 Medical procedure, unspecified as the cause of abnormal reaction of the patient, or of later complication, without mention of misadventure at the time of the procedure: Secondary | ICD-10-CM | POA: Diagnosis not present

## 2024-06-25 DIAGNOSIS — N189 Chronic kidney disease, unspecified: Secondary | ICD-10-CM

## 2024-06-25 DIAGNOSIS — E1143 Type 2 diabetes mellitus with diabetic autonomic (poly)neuropathy: Secondary | ICD-10-CM | POA: Diagnosis present

## 2024-06-25 DIAGNOSIS — G8929 Other chronic pain: Secondary | ICD-10-CM | POA: Diagnosis present

## 2024-06-25 DIAGNOSIS — Z91018 Allergy to other foods: Secondary | ICD-10-CM

## 2024-06-25 DIAGNOSIS — Z7902 Long term (current) use of antithrombotics/antiplatelets: Secondary | ICD-10-CM

## 2024-06-25 DIAGNOSIS — I5032 Chronic diastolic (congestive) heart failure: Secondary | ICD-10-CM | POA: Diagnosis present

## 2024-06-25 DIAGNOSIS — E1142 Type 2 diabetes mellitus with diabetic polyneuropathy: Secondary | ICD-10-CM | POA: Diagnosis present

## 2024-06-25 DIAGNOSIS — Z83719 Family history of colon polyps, unspecified: Secondary | ICD-10-CM

## 2024-06-25 DIAGNOSIS — Z794 Long term (current) use of insulin: Secondary | ICD-10-CM

## 2024-06-25 DIAGNOSIS — E875 Hyperkalemia: Secondary | ICD-10-CM | POA: Diagnosis not present

## 2024-06-25 DIAGNOSIS — E118 Type 2 diabetes mellitus with unspecified complications: Secondary | ICD-10-CM

## 2024-06-25 DIAGNOSIS — E782 Mixed hyperlipidemia: Secondary | ICD-10-CM | POA: Diagnosis present

## 2024-06-25 DIAGNOSIS — I4819 Other persistent atrial fibrillation: Secondary | ICD-10-CM | POA: Diagnosis present

## 2024-06-25 DIAGNOSIS — Z800 Family history of malignant neoplasm of digestive organs: Secondary | ICD-10-CM

## 2024-06-25 DIAGNOSIS — K3189 Other diseases of stomach and duodenum: Secondary | ICD-10-CM | POA: Diagnosis present

## 2024-06-25 DIAGNOSIS — S0012XA Contusion of left eyelid and periocular area, initial encounter: Secondary | ICD-10-CM | POA: Diagnosis not present

## 2024-06-25 DIAGNOSIS — J45909 Unspecified asthma, uncomplicated: Secondary | ICD-10-CM | POA: Diagnosis present

## 2024-06-25 DIAGNOSIS — Z556 Problems related to health literacy: Secondary | ICD-10-CM

## 2024-06-25 DIAGNOSIS — Z886 Allergy status to analgesic agent status: Secondary | ICD-10-CM

## 2024-06-25 DIAGNOSIS — K219 Gastro-esophageal reflux disease without esophagitis: Secondary | ICD-10-CM | POA: Diagnosis present

## 2024-06-25 DIAGNOSIS — K317 Polyp of stomach and duodenum: Secondary | ICD-10-CM | POA: Diagnosis present

## 2024-06-25 DIAGNOSIS — I25119 Atherosclerotic heart disease of native coronary artery with unspecified angina pectoris: Principal | ICD-10-CM | POA: Diagnosis present

## 2024-06-25 DIAGNOSIS — Z86718 Personal history of other venous thrombosis and embolism: Secondary | ICD-10-CM

## 2024-06-25 DIAGNOSIS — Z881 Allergy status to other antibiotic agents status: Secondary | ICD-10-CM

## 2024-06-25 DIAGNOSIS — F411 Generalized anxiety disorder: Secondary | ICD-10-CM | POA: Diagnosis present

## 2024-06-25 DIAGNOSIS — F319 Bipolar disorder, unspecified: Secondary | ICD-10-CM | POA: Diagnosis present

## 2024-06-25 DIAGNOSIS — Z5982 Transportation insecurity: Secondary | ICD-10-CM

## 2024-06-25 DIAGNOSIS — N179 Acute kidney failure, unspecified: Secondary | ICD-10-CM | POA: Diagnosis present

## 2024-06-25 DIAGNOSIS — Z9049 Acquired absence of other specified parts of digestive tract: Secondary | ICD-10-CM

## 2024-06-25 DIAGNOSIS — D649 Anemia, unspecified: Secondary | ICD-10-CM | POA: Diagnosis present

## 2024-06-25 DIAGNOSIS — Z5309 Procedure and treatment not carried out because of other contraindication: Secondary | ICD-10-CM | POA: Diagnosis not present

## 2024-06-25 DIAGNOSIS — Z91041 Radiographic dye allergy status: Secondary | ICD-10-CM

## 2024-06-25 DIAGNOSIS — Z955 Presence of coronary angioplasty implant and graft: Secondary | ICD-10-CM

## 2024-06-25 DIAGNOSIS — E66813 Obesity, class 3: Secondary | ICD-10-CM | POA: Diagnosis present

## 2024-06-25 DIAGNOSIS — E1165 Type 2 diabetes mellitus with hyperglycemia: Secondary | ICD-10-CM | POA: Diagnosis present

## 2024-06-25 DIAGNOSIS — D62 Acute posthemorrhagic anemia: Secondary | ICD-10-CM | POA: Diagnosis not present

## 2024-06-25 HISTORY — DX: Panic disorder (episodic paroxysmal anxiety): F41.0

## 2024-06-25 HISTORY — DX: Anxiety disorder, unspecified: F41.9

## 2024-06-25 LAB — CBC WITH DIFFERENTIAL/PLATELET
Abs Immature Granulocytes: 0.07 K/uL (ref 0.00–0.07)
Basophils Absolute: 0 K/uL (ref 0.0–0.1)
Basophils Relative: 1 %
Eosinophils Absolute: 0.2 K/uL (ref 0.0–0.5)
Eosinophils Relative: 3 %
HCT: 27.1 % — ABNORMAL LOW (ref 36.0–46.0)
Hemoglobin: 9.1 g/dL — ABNORMAL LOW (ref 12.0–15.0)
Immature Granulocytes: 1 %
Lymphocytes Relative: 22 %
Lymphs Abs: 1.3 K/uL (ref 0.7–4.0)
MCH: 31 pg (ref 26.0–34.0)
MCHC: 33.6 g/dL (ref 30.0–36.0)
MCV: 92.2 fL (ref 80.0–100.0)
Monocytes Absolute: 0.4 K/uL (ref 0.1–1.0)
Monocytes Relative: 6 %
Neutro Abs: 3.9 K/uL (ref 1.7–7.7)
Neutrophils Relative %: 67 %
Platelets: 235 K/uL (ref 150–400)
RBC: 2.94 MIL/uL — ABNORMAL LOW (ref 3.87–5.11)
RDW: 16 % — ABNORMAL HIGH (ref 11.5–15.5)
WBC: 5.8 K/uL (ref 4.0–10.5)
nRBC: 0 % (ref 0.0–0.2)

## 2024-06-25 NOTE — ED Triage Notes (Signed)
 Pt comes in for CP. Pain started this afternoon. Pain has been hurting ever since. Family states her arm is swollen. Pt has stage 5 kidney failure. Pt called her kidney doctor and told her to come to the ED. A&Ox4.

## 2024-06-26 ENCOUNTER — Emergency Department (HOSPITAL_COMMUNITY)

## 2024-06-26 ENCOUNTER — Other Ambulatory Visit: Payer: Self-pay

## 2024-06-26 ENCOUNTER — Inpatient Hospital Stay (HOSPITAL_COMMUNITY)

## 2024-06-26 ENCOUNTER — Encounter (HOSPITAL_COMMUNITY): Payer: Self-pay | Admitting: Acute Care

## 2024-06-26 DIAGNOSIS — Z794 Long term (current) use of insulin: Secondary | ICD-10-CM | POA: Diagnosis not present

## 2024-06-26 DIAGNOSIS — I202 Refractory angina pectoris: Secondary | ICD-10-CM | POA: Diagnosis not present

## 2024-06-26 DIAGNOSIS — I129 Hypertensive chronic kidney disease with stage 1 through stage 4 chronic kidney disease, or unspecified chronic kidney disease: Secondary | ICD-10-CM | POA: Diagnosis not present

## 2024-06-26 DIAGNOSIS — J45909 Unspecified asthma, uncomplicated: Secondary | ICD-10-CM | POA: Diagnosis present

## 2024-06-26 DIAGNOSIS — F411 Generalized anxiety disorder: Secondary | ICD-10-CM

## 2024-06-26 DIAGNOSIS — N179 Acute kidney failure, unspecified: Secondary | ICD-10-CM | POA: Diagnosis not present

## 2024-06-26 DIAGNOSIS — K3189 Other diseases of stomach and duodenum: Secondary | ICD-10-CM | POA: Diagnosis not present

## 2024-06-26 DIAGNOSIS — I25119 Atherosclerotic heart disease of native coronary artery with unspecified angina pectoris: Secondary | ICD-10-CM | POA: Diagnosis present

## 2024-06-26 DIAGNOSIS — T182XXA Foreign body in stomach, initial encounter: Secondary | ICD-10-CM | POA: Diagnosis not present

## 2024-06-26 DIAGNOSIS — Y849 Medical procedure, unspecified as the cause of abnormal reaction of the patient, or of later complication, without mention of misadventure at the time of the procedure: Secondary | ICD-10-CM | POA: Diagnosis not present

## 2024-06-26 DIAGNOSIS — G8929 Other chronic pain: Secondary | ICD-10-CM | POA: Diagnosis present

## 2024-06-26 DIAGNOSIS — I10 Essential (primary) hypertension: Secondary | ICD-10-CM | POA: Diagnosis not present

## 2024-06-26 DIAGNOSIS — E1142 Type 2 diabetes mellitus with diabetic polyneuropathy: Secondary | ICD-10-CM | POA: Diagnosis present

## 2024-06-26 DIAGNOSIS — K2950 Unspecified chronic gastritis without bleeding: Secondary | ICD-10-CM | POA: Diagnosis not present

## 2024-06-26 DIAGNOSIS — I5032 Chronic diastolic (congestive) heart failure: Secondary | ICD-10-CM | POA: Diagnosis present

## 2024-06-26 DIAGNOSIS — E66813 Obesity, class 3: Secondary | ICD-10-CM | POA: Diagnosis present

## 2024-06-26 DIAGNOSIS — E875 Hyperkalemia: Secondary | ICD-10-CM | POA: Diagnosis not present

## 2024-06-26 DIAGNOSIS — E1122 Type 2 diabetes mellitus with diabetic chronic kidney disease: Secondary | ICD-10-CM | POA: Diagnosis present

## 2024-06-26 DIAGNOSIS — I4819 Other persistent atrial fibrillation: Secondary | ICD-10-CM | POA: Diagnosis present

## 2024-06-26 DIAGNOSIS — R0789 Other chest pain: Secondary | ICD-10-CM | POA: Diagnosis not present

## 2024-06-26 DIAGNOSIS — R1032 Left lower quadrant pain: Secondary | ICD-10-CM | POA: Diagnosis not present

## 2024-06-26 DIAGNOSIS — N189 Chronic kidney disease, unspecified: Secondary | ICD-10-CM | POA: Diagnosis not present

## 2024-06-26 DIAGNOSIS — D649 Anemia, unspecified: Secondary | ICD-10-CM | POA: Diagnosis not present

## 2024-06-26 DIAGNOSIS — E1165 Type 2 diabetes mellitus with hyperglycemia: Secondary | ICD-10-CM | POA: Diagnosis present

## 2024-06-26 DIAGNOSIS — I110 Hypertensive heart disease with heart failure: Secondary | ICD-10-CM | POA: Diagnosis not present

## 2024-06-26 DIAGNOSIS — F319 Bipolar disorder, unspecified: Secondary | ICD-10-CM | POA: Diagnosis present

## 2024-06-26 DIAGNOSIS — K31A11 Gastric intestinal metaplasia without dysplasia, involving the antrum: Secondary | ICD-10-CM | POA: Diagnosis not present

## 2024-06-26 DIAGNOSIS — K922 Gastrointestinal hemorrhage, unspecified: Secondary | ICD-10-CM

## 2024-06-26 DIAGNOSIS — D631 Anemia in chronic kidney disease: Secondary | ICD-10-CM | POA: Diagnosis present

## 2024-06-26 DIAGNOSIS — I482 Chronic atrial fibrillation, unspecified: Secondary | ICD-10-CM

## 2024-06-26 DIAGNOSIS — N184 Chronic kidney disease, stage 4 (severe): Secondary | ICD-10-CM | POA: Diagnosis not present

## 2024-06-26 DIAGNOSIS — N186 End stage renal disease: Secondary | ICD-10-CM | POA: Diagnosis present

## 2024-06-26 DIAGNOSIS — K766 Portal hypertension: Secondary | ICD-10-CM | POA: Diagnosis present

## 2024-06-26 DIAGNOSIS — D62 Acute posthemorrhagic anemia: Secondary | ICD-10-CM | POA: Diagnosis not present

## 2024-06-26 DIAGNOSIS — I2510 Atherosclerotic heart disease of native coronary artery without angina pectoris: Secondary | ICD-10-CM | POA: Diagnosis not present

## 2024-06-26 DIAGNOSIS — I5031 Acute diastolic (congestive) heart failure: Secondary | ICD-10-CM | POA: Diagnosis not present

## 2024-06-26 DIAGNOSIS — I480 Paroxysmal atrial fibrillation: Secondary | ICD-10-CM | POA: Diagnosis not present

## 2024-06-26 DIAGNOSIS — K760 Fatty (change of) liver, not elsewhere classified: Secondary | ICD-10-CM | POA: Diagnosis present

## 2024-06-26 DIAGNOSIS — Z452 Encounter for adjustment and management of vascular access device: Secondary | ICD-10-CM | POA: Diagnosis not present

## 2024-06-26 DIAGNOSIS — R079 Chest pain, unspecified: Secondary | ICD-10-CM | POA: Diagnosis not present

## 2024-06-26 DIAGNOSIS — D509 Iron deficiency anemia, unspecified: Secondary | ICD-10-CM | POA: Diagnosis not present

## 2024-06-26 DIAGNOSIS — I132 Hypertensive heart and chronic kidney disease with heart failure and with stage 5 chronic kidney disease, or end stage renal disease: Secondary | ICD-10-CM | POA: Diagnosis present

## 2024-06-26 DIAGNOSIS — R072 Precordial pain: Secondary | ICD-10-CM | POA: Diagnosis present

## 2024-06-26 DIAGNOSIS — K317 Polyp of stomach and duodenum: Secondary | ICD-10-CM | POA: Diagnosis not present

## 2024-06-26 DIAGNOSIS — Z992 Dependence on renal dialysis: Secondary | ICD-10-CM | POA: Diagnosis not present

## 2024-06-26 DIAGNOSIS — I2089 Other forms of angina pectoris: Secondary | ICD-10-CM | POA: Diagnosis not present

## 2024-06-26 DIAGNOSIS — E1143 Type 2 diabetes mellitus with diabetic autonomic (poly)neuropathy: Secondary | ICD-10-CM | POA: Diagnosis present

## 2024-06-26 DIAGNOSIS — Z6841 Body Mass Index (BMI) 40.0 and over, adult: Secondary | ICD-10-CM | POA: Diagnosis not present

## 2024-06-26 DIAGNOSIS — I252 Old myocardial infarction: Secondary | ICD-10-CM | POA: Diagnosis not present

## 2024-06-26 DIAGNOSIS — E782 Mixed hyperlipidemia: Secondary | ICD-10-CM | POA: Diagnosis present

## 2024-06-26 DIAGNOSIS — Z7901 Long term (current) use of anticoagulants: Secondary | ICD-10-CM | POA: Diagnosis not present

## 2024-06-26 LAB — URINALYSIS, ROUTINE W REFLEX MICROSCOPIC
Bilirubin Urine: NEGATIVE
Glucose, UA: NEGATIVE mg/dL
Ketones, ur: NEGATIVE mg/dL
Nitrite: NEGATIVE
Protein, ur: 100 mg/dL — AB
Specific Gravity, Urine: 1.012 (ref 1.005–1.030)
pH: 5 (ref 5.0–8.0)

## 2024-06-26 LAB — ECHOCARDIOGRAM COMPLETE
AR max vel: 2.18 cm2
AV Area VTI: 2.09 cm2
AV Area mean vel: 2.15 cm2
AV Mean grad: 5 mmHg
AV Peak grad: 7.8 mmHg
Ao pk vel: 1.4 m/s
Area-P 1/2: 2.39 cm2
Height: 63 in
S' Lateral: 3 cm
Weight: 3671.98 [oz_av]

## 2024-06-26 LAB — COMPREHENSIVE METABOLIC PANEL WITH GFR
ALT: 11 U/L (ref 0–44)
AST: 10 U/L — ABNORMAL LOW (ref 15–41)
Albumin: 4.1 g/dL (ref 3.5–5.0)
Alkaline Phosphatase: 82 U/L (ref 38–126)
Anion gap: 12 (ref 5–15)
BUN: 31 mg/dL — ABNORMAL HIGH (ref 8–23)
CO2: 22 mmol/L (ref 22–32)
Calcium: 9.5 mg/dL (ref 8.9–10.3)
Chloride: 99 mmol/L (ref 98–111)
Creatinine, Ser: 3.4 mg/dL — ABNORMAL HIGH (ref 0.44–1.00)
GFR, Estimated: 15 mL/min — ABNORMAL LOW (ref >60)
Glucose, Bld: 238 mg/dL — ABNORMAL HIGH (ref 70–99)
Potassium: 4.8 mmol/L (ref 3.5–5.1)
Sodium: 133 mmol/L — ABNORMAL LOW (ref 135–145)
Total Bilirubin: 0.5 mg/dL (ref 0.0–1.2)
Total Protein: 9.5 g/dL — ABNORMAL HIGH (ref 6.5–8.1)

## 2024-06-26 LAB — GLUCOSE, CAPILLARY
Glucose-Capillary: 167 mg/dL — ABNORMAL HIGH (ref 70–99)
Glucose-Capillary: 166 mg/dL — ABNORMAL HIGH (ref 70–99)

## 2024-06-26 LAB — MAGNESIUM: Magnesium: 1.9 mg/dL (ref 1.7–2.4)

## 2024-06-26 LAB — TROPONIN T, HIGH SENSITIVITY
Troponin T High Sensitivity: 33 ng/L — ABNORMAL HIGH (ref 0–19)
Troponin T High Sensitivity: 30 ng/L — ABNORMAL HIGH (ref 0–19)
Troponin T High Sensitivity: 30 ng/L — ABNORMAL HIGH (ref 0–19)
Troponin T High Sensitivity: 34 ng/L — ABNORMAL HIGH (ref 0–19)
Troponin T High Sensitivity: 33 ng/L — ABNORMAL HIGH (ref 0–19)

## 2024-06-26 LAB — CBG MONITORING, ED
Glucose-Capillary: 169 mg/dL — ABNORMAL HIGH (ref 70–99)
Glucose-Capillary: 149 mg/dL — ABNORMAL HIGH (ref 70–99)

## 2024-06-26 LAB — PRO BRAIN NATRIURETIC PEPTIDE: Pro Brain Natriuretic Peptide: 1087 pg/mL — ABNORMAL HIGH (ref <300.0)

## 2024-06-26 MED ORDER — APIXABAN 5 MG PO TABS
5.0000 mg | ORAL_TABLET | Freq: Two times a day (BID) | ORAL | Status: DC
  Administered 2024-06-26 (×2): 5 mg via ORAL
  Filled 2024-06-26 (×2): qty 1

## 2024-06-26 MED ORDER — FENTANYL CITRATE (PF) 100 MCG/2ML IJ SOLN
50.0000 ug | Freq: Once | INTRAMUSCULAR | Status: AC
  Administered 2024-06-26: 50 ug via INTRAVENOUS
  Filled 2024-06-26: qty 2

## 2024-06-26 MED ORDER — PANTOPRAZOLE SODIUM 40 MG PO TBEC
40.0000 mg | DELAYED_RELEASE_TABLET | Freq: Two times a day (BID) | ORAL | Status: DC
  Administered 2024-06-26 – 2024-07-04 (×15): 40 mg via ORAL
  Filled 2024-06-26 (×16): qty 1

## 2024-06-26 MED ORDER — ONDANSETRON HCL 4 MG/2ML IJ SOLN
4.0000 mg | Freq: Once | INTRAMUSCULAR | Status: AC
  Administered 2024-06-26: 4 mg via INTRAVENOUS
  Filled 2024-06-26: qty 2

## 2024-06-26 MED ORDER — LAMOTRIGINE 100 MG PO TABS
100.0000 mg | ORAL_TABLET | Freq: Two times a day (BID) | ORAL | Status: DC
  Administered 2024-06-26 – 2024-07-04 (×16): 100 mg via ORAL
  Filled 2024-06-26 (×6): qty 1
  Filled 2024-06-26: qty 4
  Filled 2024-06-26 (×10): qty 1

## 2024-06-26 MED ORDER — FENTANYL CITRATE (PF) 100 MCG/2ML IJ SOLN
25.0000 ug | INTRAMUSCULAR | Status: DC | PRN
Start: 2024-06-26 — End: 2024-06-26
  Administered 2024-06-26 (×2): 25 ug via INTRAVENOUS
  Filled 2024-06-26 (×2): qty 2

## 2024-06-26 MED ORDER — CARVEDILOL 12.5 MG PO TABS
12.5000 mg | ORAL_TABLET | Freq: Every day | ORAL | Status: DC
  Administered 2024-06-26: 12.5 mg via ORAL
  Filled 2024-06-26: qty 1

## 2024-06-26 MED ORDER — FAMOTIDINE 20 MG PO TABS
20.0000 mg | ORAL_TABLET | Freq: Every day | ORAL | Status: DC
  Administered 2024-06-26 – 2024-07-04 (×8): 20 mg via ORAL
  Filled 2024-06-26 (×9): qty 1

## 2024-06-26 MED ORDER — SERTRALINE HCL 50 MG PO TABS
100.0000 mg | ORAL_TABLET | Freq: Every day | ORAL | Status: DC
Start: 2024-06-26 — End: 2024-07-04
  Administered 2024-06-26 – 2024-07-04 (×8): 100 mg via ORAL
  Filled 2024-06-26 (×9): qty 2

## 2024-06-26 MED ORDER — INSULIN ASPART 100 UNIT/ML IJ SOLN
0.0000 [IU] | Freq: Three times a day (TID) | INTRAMUSCULAR | Status: DC
  Administered 2024-06-26: 1 [IU] via SUBCUTANEOUS
  Administered 2024-06-26 – 2024-06-27 (×4): 2 [IU] via SUBCUTANEOUS
  Administered 2024-06-27: 3 [IU] via SUBCUTANEOUS
  Administered 2024-06-27 – 2024-06-28 (×4): 2 [IU] via SUBCUTANEOUS
  Administered 2024-06-28: 3 [IU] via SUBCUTANEOUS
  Administered 2024-06-28 – 2024-06-29 (×2): 2 [IU] via SUBCUTANEOUS
  Administered 2024-06-29 – 2024-06-30 (×3): 1 [IU] via SUBCUTANEOUS
  Administered 2024-06-30: 3 [IU] via SUBCUTANEOUS
  Administered 2024-07-01 (×2): 1 [IU] via SUBCUTANEOUS
  Administered 2024-07-02 (×3): 2 [IU] via SUBCUTANEOUS
  Administered 2024-07-02 – 2024-07-03 (×2): 3 [IU] via SUBCUTANEOUS
  Administered 2024-07-03: 1 [IU] via SUBCUTANEOUS
  Administered 2024-07-03: 2 [IU] via SUBCUTANEOUS
  Administered 2024-07-03 – 2024-07-04 (×2): 3 [IU] via SUBCUTANEOUS
  Administered 2024-07-04: 2 [IU] via SUBCUTANEOUS
  Filled 2024-06-26: qty 1

## 2024-06-26 MED ORDER — ISOSORBIDE MONONITRATE ER 60 MG PO TB24
60.0000 mg | ORAL_TABLET | Freq: Every morning | ORAL | Status: DC
  Administered 2024-06-26 – 2024-06-28 (×3): 60 mg via ORAL
  Filled 2024-06-26 (×3): qty 1

## 2024-06-26 MED ORDER — CLONAZEPAM 0.5 MG PO TABS
0.5000 mg | ORAL_TABLET | Freq: Two times a day (BID) | ORAL | Status: DC | PRN
  Administered 2024-06-28 – 2024-07-03 (×3): 0.5 mg via ORAL
  Filled 2024-06-26 (×3): qty 1

## 2024-06-26 MED ORDER — FENTANYL CITRATE (PF) 50 MCG/ML IJ SOSY
25.0000 ug | PREFILLED_SYRINGE | INTRAMUSCULAR | Status: DC | PRN
Start: 2024-06-26 — End: 2024-07-04
  Administered 2024-06-26 – 2024-07-03 (×23): 25 ug via INTRAVENOUS
  Filled 2024-06-26 (×24): qty 1

## 2024-06-26 MED ORDER — CLOPIDOGREL BISULFATE 75 MG PO TABS
75.0000 mg | ORAL_TABLET | Freq: Every day | ORAL | Status: DC
  Administered 2024-06-26 – 2024-07-04 (×7): 75 mg via ORAL
  Filled 2024-06-26 (×9): qty 1

## 2024-06-26 NOTE — ED Notes (Signed)
 Pt informed this nurse that she has been having numbness to left arm/left leg off and on for past two days and difficultly getting her words out as well. Dr. Vicci informed and new orders for neuro checks.

## 2024-06-26 NOTE — Progress Notes (Signed)
 Pt arrived to room 330 via WC from ED. Pt able to stand and ambulate with standby assist from chair to standing scale and then to bed. A&O x4. Tele monitor applied and verified. VSS. Pt oriented to room and safety procedures, states understanding. Bed alarm on and call bell within reach for safety. Pt advised to call for needs.  Echo tech in room to perform ordered procedure.

## 2024-06-26 NOTE — H&P (Addendum)
 History and Physical    Patient: [name] FMW:[id number] DOB: [birth year] DOA: 06/25/2024 DOS: the patient was seen and examined on 06/26/2024 PCP: System, Provider Not In  Patient coming from: Home  Chief Complaint:  Chief Complaint  Patient presents with   Chest Pain   HPI: [name] is a 63 y.o. female with medical history significant of generalized anxiety disorder with panic attacks, asthma. Chronic chest pain  CAD, STEMI with PCI x . GI bleed, DVT, Bipolar disorder, DM 2, neuropathy, hyperlipidemia, hypertension, GERD, CKD V, gastroparesis, anemia of chronic disease and a fib. Patient reports over last 5-6 months has had worsening renal function has experienced dec,line in mobility tolerance, strength and dizziness and dyspnea when she gets up.  (Utilizes rolling seat walker with assistance from family members for home mobility)now progressed to stage 5. She is followed  with Dr Fernand nephrology in [place] .  This past Wednesday she experienced chest pain  accompanied by diaphoresis and nausea. Chest pain described left side of her chest and her left arm. This event occurred again today and her nephrologist referred her to come here to [facility] as patient has appointment on 18th. Workup in ED troponin flat 33-30, chest pain resolved with fentanyl  (unable to tolerate nitroglycerin ),  BNP 1087 with normal respirations and sats on room air. Last ECHO reviewed in EPIC 05/06/2024 EF 60-65%, LVH grade I diastolic dysfunction Review of Systems: As mentioned in the history of present illness. All other systems reviewed and are negative. Past Medical History:  Diagnosis Date   Anxiety    Asthma    Atrial fibrillation (HCC)    Bipolar 1 disorder (HCC)    Chronic back pain    Chronic chest pain    Diabetes mellitus without complication (HCC)    DVT (deep venous thrombosis) (HCC) 2017   Gastroparesis    GERD (gastroesophageal reflux disease)    GI bleed 11/2023   required 2 units PRBC  transfusion   Hyperlipemia    Hypertension    IBS (irritable bowel syndrome)    Normal cardiac stress test 02/14/2014   [facility]   Panic attacks    Past Surgical History:  Procedure Laterality Date   ABDOMINAL HYSTERECTOMY  2001   APPENDECTOMY  10/2005   CHOLECYSTECTOMY  10/2005   COLONOSCOPY  2007   [facility] ([name])-pt reports hemorrhoids   ESOPHAGOGASTRODUODENOSCOPY  02/28/2006   [name], normal on daily PPI, myultiple hyperplastic polyps   MOUTH SURGERY     RIGHT HEART CATH N/A 04/25/2023   Procedure: RIGHT HEART CATH;  Surgeon: Verlin Lonni BIRCH, MD;  Location: [facility];  Service: Cardiovascular;  Laterality: N/A;   RIGHT OOPHORECTOMY  1999   Social History:  reports that she is a non-smoker but has been exposed to tobacco smoke. She has never used smokeless tobacco. She reports that she does not drink alcohol and does not use drugs.  Allergies  Allergen Reactions   Gelatin Swelling    No jello of any kind   Iodinated Contrast Media Anaphylaxis, Hives, Itching and Swelling   Methylprednisolone  Sodium Succ Shortness Of Breath   Dicyclomine Hcl Anxiety and Other (See Comments)    shaky and sick GI upset, sick to stomach   Doxycycline Hives and Rash    rash   Glutamic Acid Itching and Swelling   Ketorolac Hives and Rash    Tolerated Toradol in the ED without side effect, hives, or complaint on 02/17/2022   Lisinopril  Swelling  Angioedema (facial, lip, or tongue swelling)   Metformin Nausea And Vomiting and Other (See Comments)    Dyspepsia sick to stomach It made me sick where I couldn't get out of the bed   Nitroglycerin  Nausea And Vomiting and Swelling    Pt states it makes her too sick   Nsaids Other (See Comments)    Stomach pain/bleeding   Other Itching, Swelling and Other (See Comments)    Solu-Medrol  Mix-O-Vial   Quetiapine Other (See Comments)   Vancomycin     Itching and erythema at infusion site within a few minutes of  starting infusion. No systemic evidence of Red Man Syndrome   Blueberry Flavoring Agent (Non-Screening) Nausea And Vomiting and Rash   Cephalexin  Nausea And Vomiting and Nausea Only    sick to stomach   Fish Allergy Rash   Ibuprofen Nausea And Vomiting    GI upset Reports stomach upset 2/2 hx gastric polyps. Not true allergy.   Nitrofuran Derivatives Itching and Nausea And Vomiting    sick to stomach   Shellfish Allergy Itching, Swelling and Rash   Strawberry Flavoring Agent (Non-Screening) Nausea And Vomiting and Rash   Sulfa Antibiotics Nausea And Vomiting and Other (See Comments)    It just makes me real sick    Family History  Problem Relation Age of Onset   Cirrhosis Mother 27       ?etiology   Cirrhosis Father        ?etiology   Cirrhosis Sister 30       ?etiology   Diverticulitis Sister     Prior to Admission medications   Medication Sig Start Date End Date Taking? Authorizing Provider  albuterol  (VENTOLIN  HFA) 108 (90 Base) MCG/ACT inhaler Inhale 2 puffs into the lungs every 6 (six) hours as needed for wheezing or shortness of breath.    [provider]  apixaban  (ELIQUIS ) 5 MG TABS tablet Take 1 tablet (5 mg total) by mouth 2 (two) times daily. 11/17/17   Suzette Pac, MD  atorvastatin  (LIPITOR) 40 MG tablet Take 40 mg by mouth every morning.    [provider]  carvedilol  (COREG ) 25 MG tablet Take 0.5 tablets (12.5 mg total) by mouth 2 (two) times daily with a meal. Patient taking differently: Take 12.5 mg by mouth daily with breakfast. 05/03/23   Tobie Paticia BROCKS, MD  cephALEXin  (KEFLEX ) 500 MG capsule Take 1 capsule (500 mg total) by mouth 3 (three) times daily. 05/28/24   Jerral Meth, MD  clonazePAM  (KLONOPIN ) 0.5 MG tablet Take 1 tablet (0.5 mg total) by mouth 2 (two) times daily as needed for anxiety. 05/18/24   Haze Lonni PARAS, MD  clopidogrel  (PLAVIX ) 75 MG tablet Take 75 mg by mouth daily.    [provider]  famotidine   (PEPCID ) 20 MG tablet Take 20 mg by mouth 2 (two) times daily.    [provider]  gabapentin  (NEURONTIN ) 300 MG capsule Take 300 mg by mouth 3 (three) times daily.    [provider]  isosorbide  mononitrate (IMDUR ) 60 MG 24 hr tablet Take 1 tablet (60 mg total) by mouth every morning. 05/09/24   Tat, Alm, MD  lamoTRIgine  (LAMICTAL ) 100 MG tablet Take 100 mg by mouth 2 (two) times daily.    [provider]  LANTUS  SOLOSTAR 100 UNIT/ML Solostar Pen Inject 50 Units into the skin 2 (two) times daily.    [provider]  NOVOLOG  FLEXPEN 100 UNIT/ML FlexPen Inject 20 Units into the skin 4 (  four) times daily. 4-5 times daily 05/13/15   [provider]  ondansetron  (ZOFRAN ) 4 MG tablet Take 1 tablet (4 mg total) by mouth every 6 (six) hours as needed for nausea or vomiting. 05/28/24   Jerral Meth, MD  pantoprazole  (PROTONIX ) 40 MG tablet Take 40 mg by mouth 2 (two) times daily before a meal.    [provider]  promethazine  (PHENERGAN ) 25 MG tablet Take 25 mg by mouth every 6 (six) hours as needed for nausea or vomiting.    [provider]  sertraline  (ZOLOFT ) 100 MG tablet Take 100 mg by mouth daily.    [provider]  SYMBICORT 160-4.5 MCG/ACT inhaler Inhale 2 puffs into the lungs daily at 6 (six) AM. 02/03/24   [provider]  vitamin B-12 (VITAMIN B12) 500 MCG tablet Take 1 tablet (500 mcg total) by mouth daily. 05/09/24   Evonnie Lenis, MD    Physical Exam: Vitals:   06/26/24 0515 06/26/24 0545 06/26/24 0600 06/26/24 0615  BP:  126/66 134/66   Pulse: 70 68 68 70  Resp: 16 11 15 14   Temp:      TempSrc:      SpO2: 98% 96% 98% 98%  Weight:      Height:      EKG reviewed by me - SR no STE; abnormal R wave progression, prolonged QTc  PE Neuro - alert and oriented x 4, MAEs equally, general weakness CV - S1S2, no peripheral edema, peripheral pulses 1 +, chest pain currently increasing mild to moderte +  nausea Pulm-lungs clear, no dyspnea at rest, able to currently lay flat without PND GI-abdomen soft, non tender   Assessment and Plan: Chest pain/CAD/STEMI with PCI hx -repeat EKG and troponin this AM Continue imdur , coreg , plavix , eliquis , lipitor, plavix  Prn fentanyl  for chest pain Monitor on tele Consult cardiology in AM  Elevated pro BNP  1087 Last echo mild LVH with EF 60-65% and grade I diastolic dysfunction 04/2024 - No current evidence of acute volume overload Repeat echo ordered  A fib Currently SR rate controlled Continue coreg  and eliquis  Monitor on tele  Diabetes (type 2 diabetes mellitus with hyperglycemia) Continue lantus  ACHS sliding scale insulin  - renal dose  Neurontin  for neuropathy F/u hgbA1C  CKD 4 Patient not on HD, still voids Renal protection strategies Monitor renal function  GAD/Bipolar Continue lamictal , sertraline , klonopin  Has planned psych appointment in 08/2024  GI bleed Reduce pepcid  to daily dosing due to renal function Protonix  continued Monitor for signs of bleeding and hgb trend       Advance Care Planning:   Code Status: Prior full  Consults: cardiology  Family Communication: daughter and husband at bedside  Severity of Illness: The appropriate patient status for this patient is INPATIENT. Inpatient status is judged to be reasonable and necessary in order to provide the required intensity of service to ensure the patient's safety. The patient's presenting symptoms, physical exam findings, and initial radiographic and laboratory data in the context of their chronic comorbidities is felt to place them at high risk for further clinical deterioration. Furthermore, it is not anticipated that the patient will be medically stable for discharge from the hospital within 2 midnights of admission.   * I certify that at the point of admission it is my clinical judgment that the patient will require inpatient hospital care spanning beyond  2 midnights from the point of admission due to high intensity of service, high risk for further deterioration and high frequency  of surveillance required.*  Author: Kaidyn Javid, DO 06/26/2024 6:46 AM  Attestation: Patient was seen and examined personally.  Patient's chart including labs, imaging and notes reviewed. I discussed with the Nurse Practitioner and agree with above assessment and plan.   For on call review [email or web address].

## 2024-06-26 NOTE — ED Provider Notes (Signed)
 [facility] EMERGENC[facility] DEPARTMENT AT [facility] Provider Note   CSN: [id number] Arrival date & time: 06/25/24  2124     Patient presents with: Chest Pain   [name] is a 63 y.o. female.   The history is provided by the patient and a relative.   Patient with history of atrial fibrillation, bipolar, chronic back pain, diabetes, hypertension presents for multiple complaints.  Patient reports that she has been having intermittent episodes of chest pain today.  It will occur at rest feels like there is tightness and squeezing and she has associated nausea and diaphoresis and shortness of breath.  No fevers or vomiting.  She reports feeling lightheaded but no syncope.  No focal arm or leg weakness.  She is now pain-free.  She has had multiple episodes today.  Patient reports continued abdominal pain for the past several days as well  No recent falls or trauma Past Medical History:  Diagnosis Date   Asthma    Atrial fibrillation (HCC)    Bipolar 1 disorder (HCC)    Chronic back pain    Chronic chest pain    Diabetes mellitus without complication (HCC)    Gastroparesis    GERD (gastroesophageal reflux disease)    Hyperlipemia    Hypertension    IBS (irritable bowel syndrome)    Normal cardiac stress test 02/2014   [facility]    Prior to Admission medications   Medication Sig Start Date End Date Taking? Authorizing Provider  albuterol  (VENTOLIN  HFA) 108 (90 Base) MCG/ACT inhaler Inhale 2 puffs into the lungs every 6 (six) hours as needed for wheezing or shortness of breath.    [provider]  apixaban  (ELIQUIS ) 5 MG TABS tablet Take 1 tablet (5 mg total) by mouth 2 (two) times daily. 11/17/17   Suzette Pac, MD  atorvastatin  (LIPITOR) 40 MG tablet Take 40 mg by mouth every morning.    [provider]  carvedilol  (COREG ) 25 MG tablet Take 0.5 tablets (12.5 mg total) by mouth 2 (two) times daily with a meal. Patient taking differently: Take  12.5 mg by mouth daily with breakfast. 05/03/23   Tobie Paticia BROCKS, MD  cephALEXin  (KEFLEX ) 500 MG capsule Take 1 capsule (500 mg total) by mouth 3 (three) times daily. 05/28/24   Jerral Meth, MD  clonazePAM  (KLONOPIN ) 0.5 MG tablet Take 1 tablet (0.5 mg total) by mouth 2 (two) times daily as needed for anxiety. 05/18/24   Haze Lonni PARAS, MD  clopidogrel  (PLAVIX ) 75 MG tablet Take 75 mg by mouth daily.    [provider]  famotidine  (PEPCID ) 20 MG tablet Take 20 mg by mouth 2 (two) times daily.    [provider]  gabapentin  (NEURONTIN ) 300 MG capsule Take 300 mg by mouth 3 (three) times daily.    [provider]  isosorbide  mononitrate (IMDUR ) 60 MG 24 hr tablet Take 1 tablet (60 mg total) by mouth every morning. 05/09/24   Tat, Alm, MD  lamoTRIgine  (LAMICTAL ) 100 MG tablet Take 100 mg by mouth 2 (two) times daily.    [provider]  LANTUS  SOLOSTAR 100 UNIT/ML Solostar Pen Inject 50 Units into the skin 2 (two) times daily.    [provider]  NOVOLOG  FLEXPEN 100 UNIT/ML FlexPen Inject 20 Units into the skin 4 (four) times daily. 4-5 times daily 05/13/15   [provider]  ondansetron  (ZOFRAN ) 4 MG tablet Take 1 tablet (4 mg total) by mouth every 6 (six) hours as  needed for nausea or vomiting. 05/28/24   Jerral Meth, MD  pantoprazole  (PROTONIX ) 40 MG tablet Take 40 mg by mouth 2 (two) times daily before a meal.    [provider]  promethazine  (PHENERGAN ) 25 MG tablet Take 25 mg by mouth every 6 (six) hours as needed for nausea or vomiting.    [provider]  sertraline  (ZOLOFT ) 100 MG tablet Take 100 mg by mouth daily.    [provider]  SYMBICORT 160-4.5 MCG/ACT inhaler Inhale 2 puffs into the lungs daily at 6 (six) AM. 02/03/24   [provider]  vitamin B-12 (VITAMIN B12) 500 MCG tablet Take 1 tablet (500 mcg total) by mouth daily. 05/09/24   Evonnie Lenis, MD    Allergies: Gelatin, Iodinated  contrast media, Methylprednisolone  sodium succ, Dicyclomine hcl, Doxycycline, Glutamic acid, Ketorolac, Lisinopril , Metformin, Nitroglycerin , Nsaids, Other, Quetiapine, Vancomycin, Blueberry flavoring agent (non-screening), Cephalexin , Fish allergy, Ibuprofen, Nitrofuran derivatives, Shellfish allergy, Strawberry flavoring agent (non-screening), and Sulfa antibiotics    Review of Systems  Constitutional:  Positive for diaphoresis. Negative for fever.  Respiratory:  Positive for shortness of breath.   Cardiovascular:  Positive for chest pain.  Gastrointestinal:  Positive for abdominal pain. Negative for vomiting.    Updated Vital Signs BP (!) 110/48   Pulse 66   Temp 98.6 F (37 C) (Oral)   Resp 15   Ht 1.6 m (5' 3)   Wt 104.1 kg   SpO2 97%   BMI 40.65 kg/m   Physical Exam CONSTITUTIONAL: Ill-appearing HEAD: Normocephalic/atraumatic EYES: EOMI/PERRL ENMT: Mucous membranes moist NECK: supple no meningeal signs CV: S1/S2 noted, no murmurs/rubs/gallops noted LUNGS: Lungs are clear to auscultation bilaterally, no apparent distress ABDOMEN: soft, diffuse lower abdominal tenderness, no rebound or guarding, bowel sounds noted throughout abdomen GU:no cva tenderness NEURO: Pt is awake/alert/appropriate, moves all extremitiesx4.  No facial droop.   EXTREMITIES: pulses normal/equal, full ROM Distal pulses equal and intact SKIN: warm, color normal  (all labs ordered are listed, but only abnormal results are displayed) Labs Reviewed  CBC WITH DIFFERENTIAL/PLATELET - Abnormal; Notable for the following components:      Result Value   RBC 2.94 (*)    Hemoglobin 9.1 (*)    HCT 27.1 (*)    RDW 16.0 (*)    All other components within normal limits  COMPREHENSIVE METABOLIC PANEL WITH GFR - Abnormal; Notable for the following components:   Sodium 133 (*)    Glucose, Bld 238 (*)    BUN 31 (*)    Creatinine, Ser 3.40 (*)    Total Protein 9.5 (*)    AST 10 (*)    GFR, Estimated 15 (*)     All other components within normal limits  PRO BRAIN NATRIURETIC PEPTIDE - Abnormal; Notable for the following components:   Pro Brain Natriuretic Peptide 1,087.0 (*)    All other components within normal limits  TROPONIN T, HIGH SENSITIVITY - Abnormal; Notable for the following components:   Troponin T High Sensitivity 33 (*)    All other components within normal limits  TROPONIN T, HIGH SENSITIVITY - Abnormal; Notable for the following components:   Troponin T High Sensitivity 30 (*)    All other components within normal limits  MAGNESIUM   URINALYSIS, ROUTINE W REFLEX MICROSCOPIC    EKG: EKG Interpretation Date/Time:  Saturday June 26 2024 01:10:28 EDT Ventricular Rate:  73 PR Interval:  151 QRS Duration:  100 QT Interval:  465 QTC Calculation: 513 R Axis:  41  Text Interpretation: Sinus rhythm Abnormal R-wave progression, early transition Prolonged QT interval No significant change since last tracing Confirmed by Midge Golas ([id number]) on 06/26/2024 1:14:24 AM  Radiology: CT ABDOMEN PELVIS WO CONTRAST Result Date: 06/26/2024 CLINICAL DATA:  Chest pain EXAM: CT ABDOMEN AND PELVIS WITHOUT CONTRAST TECHNIQUE: Multidetector CT imaging of the abdomen and pelvis was performed following the standard protocol without IV contrast. RADIATION DOSE REDUCTION: This exam was performed according to the departmental dose-optimization program which includes automated exposure control, adjustment of the mA and/or kV according to patient size and/or use of iterative reconstruction technique. COMPARISON:  05/28/2024 FINDINGS: Lower chest: No acute abnormality. Hepatobiliary: No focal liver abnormality is seen. Status post cholecystectomy. No biliary dilatation. Pancreas: Unremarkable. No pancreatic ductal dilatation or surrounding inflammatory changes. Spleen: Normal in size without focal abnormality. Adrenals/Urinary Tract: Adrenal glands are within normal limits. Kidneys are well visualized  bilaterally. The bladder is decompressed. Punctate nonobstructing renal stones are noted in the lower poles bilaterally. Stomach/Bowel: No obstructive or inflammatory changes of the colon are noted. The appendix has been surgically removed. Small bowel and stomach are within normal limits. Vascular/Lymphatic: Aortic atherosclerosis. No enlarged abdominal or pelvic lymph nodes. Reproductive: Status post hysterectomy. No adnexal masses. Other: No abdominal wall hernia or abnormality. No abdominopelvic ascites. Musculoskeletal: No acute or significant osseous findings. IMPRESSION: Punctate nonobstructing renal calculi. No other focal abnormality is noted. Electronically Signed   By: Oneil Devonshire M.D.   On: 06/26/2024 02:40   DG Chest Portable 1 View Result Date: 06/26/2024 CLINICAL DATA:  Chest pain for several hours, initial encounter EXAM: PORTABLE CHEST 1 VIEW COMPARISON:  05/05/2024 FINDINGS: The heart size and mediastinal contours are within normal limits. Both lungs are clear. The visualized skeletal structures are unremarkable. IMPRESSION: No active disease. Electronically Signed   By: Oneil Devonshire M.D.   On: 06/26/2024 02:37     Procedures   Medications Ordered in the ED  ondansetron  (ZOFRAN ) injection 4 mg (4 mg Intravenous Given 06/26/24 0157)  fentaNYL  (SUBLIMAZE ) injection 50 mcg (50 mcg Intravenous Given 06/26/24 0157)    Clinical Course as of 06/26/24 0319  Sat Jun 26, 2024  0241 Hemoglobin(!): 9.1 Anemia [DW]  0241 Creatinine(!): 3.40 Chronic renal failure [DW]  0241 Glucose(!): 238 hyperGlycemia [DW]  0246 Patient presented for multiple issues including chest pain and abdominal pain.  Chest pain episodes are concerning given she is having abrupt onset of chest pressure and tightness as well as diaphoresis.  She will need to be admitted for further evaluation.  Troponins are flat at around 30, likely due to her underlying renal failure  Patient also reported ongoing lower  abdominal pain, but did not reveal that she was recently seen in outside hospital for the same.  CT scan here is unrevealing for any acute emergent process [DW]  0316 Discussed with hospitalist team for admission [DW]    Clinical Course User Index [DW] Midge Golas, MD             HEART Score: 6                    Medical Decision Making Amount and/or Complexity of Data Reviewed Labs: ordered. Decision-making details documented in ED Course. Radiology: ordered.  Risk Prescription drug management. Decision regarding hospitalization.   This patient presents to the ED for concern of chest pain and abdominal pain, this involves an extensive number of treatment options, and is a complaint that carries with it a  high risk of complications and morbidity.  The differential diagnosis includes but is not limited to acute coronary syndrome, aortic dissection, pulmonary embolism, pericarditis, pneumothorax, pneumonia, myocarditis, pleurisy, esophageal rupture  pancreatitis, gastritis, peptic ulcer disease, bowel obstruction, bowel perforation, diverticulitis, AAA, ischemic bowel    Comorbidities that complicate the patient evaluation: Patient's presentation is complicated by their history of atrial fibrillation, bipolar  Social Determinants of Health: Patient's multiple ER visits, poor health literacy  increases the complexity of managing their presentation  Additional history obtained: Additional history obtained from family Records reviewed Care Everywhere/External Records  Lab Tests: I Ordered, and personally interpreted labs.  The pertinent results include: Anemia, chronic renal failure, hyperglycemia  Imaging Studies ordered: I ordered imaging studies including CT scan abdomen pelvis and X-ray chest  I independently visualized and interpreted imaging which showed no acute findings I agree with the radiologist interpretation  Cardiac Monitoring: The patient was maintained on a  cardiac monitor.  I personally viewed and interpreted the cardiac monitor which showed an underlying rhythm of:  sinus rhythm  Medicines ordered and prescription drug management: I ordered medication including fentanyl  for pain Reevaluation of the patient after these medicines showed that the patient    improved   Critical Interventions:   patient for evaluation for chest pain  Consultations Obtained: I requested consultation with the admitting physician [facility], and discussed  findings as well as pertinent plan - they recommend: Admit  Reevaluation: After the interventions noted above, I reevaluated the patient and found that they have :improved  Complexity of problems addressed: Patient's presentation is most consistent with  acute presentation with potential threat to life or bodily function  Disposition: After consideration of the diagnostic results and the patient's response to treatment,  I feel that the patent would benefit from admission  .   Patient to be admitted for chest pain evaluation.  No acute EKG changes.  Troponins have remained flat around 30.  She is already on anticoagulation.  Given that her pain is improved, will defer further management to the hospitalist team     Final diagnoses:  Precordial pain  Chronic renal failure, unspecified CKD stage    ED Discharge Orders     None          Midge Golas, MD 06/26/24 [phone]

## 2024-06-26 NOTE — Progress Notes (Signed)
 ASSUMPTION OF CARE NOTE   06/26/2024 12:50 PM  [name] was seen and examined.  The H&P by the admitting provider, orders, imaging was reviewed.  Please see new orders.  Will continue to follow.   Vitals:   06/26/24 1130 06/26/24 1145  BP:    Pulse: 69 72  Resp: 19 16  Temp:    SpO2: 98% 99%    Results for orders placed or performed during the hospital encounter of 06/25/24  CBC with Differential   Collection Time: 06/25/24 11:29 PM  Result Value Ref Range   WBC 5.8 4.0 - 10.5 K/uL   RBC 2.94 (L) 3.87 - 5.11 MIL/uL   Hemoglobin 9.1 (L) 12.0 - 15.0 g/dL   HCT 72.8 (L) 63.9 - 53.9 %   MCV 92.2 80.0 - 100.0 fL   MCH 31.0 26.0 - 34.0 pg   MCHC 33.6 30.0 - 36.0 g/dL   RDW 83.9 (H) 88.4 - 84.4 %   Platelets 235 150 - 400 K/uL   nRBC 0.0 0.0 - 0.2 %   Neutrophils Relative % 67 %   Neutro Abs 3.9 1.7 - 7.7 K/uL   Lymphocytes Relative 22 %   Lymphs Abs 1.3 0.7 - 4.0 K/uL   Monocytes Relative 6 %   Monocytes Absolute 0.4 0.1 - 1.0 K/uL   Eosinophils Relative 3 %   Eosinophils Absolute 0.2 0.0 - 0.5 K/uL   Basophils Relative 1 %   Basophils Absolute 0.0 0.0 - 0.1 K/uL   Immature Granulocytes 1 %   Abs Immature Granulocytes 0.07 0.00 - 0.07 K/uL  Comprehensive metabolic panel   Collection Time: 06/25/24 11:29 PM  Result Value Ref Range   Sodium 133 (L) 135 - 145 mmol/L   Potassium 4.8 3.5 - 5.1 mmol/L   Chloride 99 98 - 111 mmol/L   CO2 22 22 - 32 mmol/L   Glucose, Bld 238 (H) 70 - 99 mg/dL   BUN 31 (H) 8 - 23 mg/dL   Creatinine, Ser 6.59 (H) 0.44 - 1.00 mg/dL   Calcium  9.5 8.9 - 10.3 mg/dL   Total Protein 9.5 (H) 6.5 - 8.1 g/dL   Albumin  4.1 3.5 - 5.0 g/dL   AST 10 (L) 15 - 41 U/L   ALT 11 0 - 44 U/L   Alkaline Phosphatase 82 38 - 126 U/L   Total Bilirubin 0.5 0.0 - 1.2 mg/dL   GFR, Estimated 15 (L) >60 mL/min   Anion gap 12 5 - 15  Pro Brain natriuretic peptide   Collection Time: 06/25/24 11:29 PM  Result Value Ref Range   Pro Brain Natriuretic Peptide  1,087.0 (H) <300.0 pg/mL  Magnesium    Collection Time: 06/25/24 11:29 PM  Result Value Ref Range   Magnesium  1.9 1.7 - 2.4 mg/dL  Troponin T, High Sensitivity   Collection Time: 06/25/24 11:29 PM  Result Value Ref Range   Troponin T High Sensitivity 33 (H) 0 - 19 ng/L  Troponin T, High Sensitivity   Collection Time: 06/26/24  1:17 AM  Result Value Ref Range   Troponin T High Sensitivity 30 (H) 0 - 19 ng/L  Troponin T, High Sensitivity   Collection Time: 06/26/24  5:40 AM  Result Value Ref Range   Troponin T High Sensitivity 30 (H) 0 - 19 ng/L  CBG monitoring, ED   Collection Time: 06/26/24  8:06 AM  Result Value Ref Range   Glucose-Capillary 169 (H) 70 - 99 mg/dL  Troponin T, High Sensitivity  Collection Time: 06/26/24  9:19 AM  Result Value Ref Range   Troponin T High Sensitivity 34 (H) 0 - 19 ng/L  Troponin T, High Sensitivity   Collection Time: 06/26/24 11:31 AM  Result Value Ref Range   Troponin T High Sensitivity 33 (H) 0 - 19 ng/L  CBG monitoring, ED   Collection Time: 06/26/24 11:49 AM  Result Value Ref Range   Glucose-Capillary 149 (H) 70 - 99 mg/dL     KYM Louder, MD [facility]   06/25/2024  9:43 PM How to contact the [facility] Attending or Consulting provider 7A - 7P or covering provider during after hours 7P -7A, for this patient?  Check the care team in [facility] and look for a) attending/consulting [facility] provider listed and b) the [facility] team listed Log into [email or web address] and use [place]'s universal password to access. If you do not have the password, please contact the hospital operator. Locate the [facility] provider you are looking for under [facility] and page to a number that you can be directly reached. If you still have difficulty reaching the provider, please page the [facility] (Director on Call) for the Hospitalists listed on amion for assistance.

## 2024-06-26 NOTE — TOC Progression Note (Signed)
 Transition of Care [facility]) - Progression Note    Patient Details  Name: [name] MRN: [id number] Date of Birth: April 27, 1961  [facility] CM/SW Contact  Sharlyne Stabs, RN Phone Number: 06/26/2024, 11:49 AM  Clinical Narrative:  Patient in ED waiting on bed on 300, being admitted with Chest pain. Patient recently admitted and assessed due to high risk for readmission . She lives with family. Pt states she is independent with ADLs but has assistance if needed. Pt can drive and family can provide transport as well. Pt states she has not had HH. Pt has a rollator to use when needed. Planning to discharge home tomorrow, IPCM following.      Expected Discharge Plan: Home/Self Care Barriers to Discharge: Continued Medical Work up    Social Drivers of Health (SDOH) Interventions SDOH Screenings   Food Insecurity: No Food Insecurity (06/26/2024)  Housing: Unknown (06/26/2024)  Transportation Needs: No Transportation Needs (06/26/2024)  Recent Concern: Transportation Needs - Unmet Transportation Needs (05/06/2024)  Utilities: Not At Risk (06/26/2024)  Tobacco Use: Medium Risk (06/26/2024)    Readmission Risk Interventions    06/26/2024   11:49 AM 05/07/2024   10:40 AM 06/05/2023   12:59 PM  Readmission Risk Prevention Plan  Transportation Screening Complete Complete Complete  PCP or Specialist Appt within 3-5 Days Not Complete    HRI or Home Care Consult Complete Complete   Social Work Consult for Recovery Care Planning/Counseling Complete Complete   Palliative Care Screening Not Applicable Not Applicable   Medication Review Oceanographer) Complete Complete Complete  HRI or Home Care Consult   Complete  SW Recovery Care/Counseling Consult   Complete  Palliative Care Screening   Not Applicable  Skilled Nursing Facility   Not Applicable

## 2024-06-26 NOTE — ED Notes (Addendum)
 Pt states she is having a hard time urinating but was able to provide urine sample

## 2024-06-26 NOTE — Plan of Care (Signed)

## 2024-06-27 ENCOUNTER — Inpatient Hospital Stay (HOSPITAL_COMMUNITY)

## 2024-06-27 DIAGNOSIS — R079 Chest pain, unspecified: Secondary | ICD-10-CM | POA: Diagnosis not present

## 2024-06-27 DIAGNOSIS — N189 Chronic kidney disease, unspecified: Secondary | ICD-10-CM

## 2024-06-27 DIAGNOSIS — I202 Refractory angina pectoris: Secondary | ICD-10-CM | POA: Diagnosis not present

## 2024-06-27 DIAGNOSIS — I10 Essential (primary) hypertension: Secondary | ICD-10-CM | POA: Diagnosis not present

## 2024-06-27 DIAGNOSIS — N179 Acute kidney failure, unspecified: Secondary | ICD-10-CM | POA: Diagnosis not present

## 2024-06-27 DIAGNOSIS — Z794 Long term (current) use of insulin: Secondary | ICD-10-CM

## 2024-06-27 LAB — GLUCOSE, CAPILLARY
Glucose-Capillary: 161 mg/dL — ABNORMAL HIGH (ref 70–99)
Glucose-Capillary: 165 mg/dL — ABNORMAL HIGH (ref 70–99)
Glucose-Capillary: 218 mg/dL — ABNORMAL HIGH (ref 70–99)
Glucose-Capillary: 204 mg/dL — ABNORMAL HIGH (ref 70–99)
Glucose-Capillary: 180 mg/dL — ABNORMAL HIGH (ref 70–99)

## 2024-06-27 LAB — BASIC METABOLIC PANEL WITH GFR
Anion gap: 12 (ref 5–15)
BUN: 43 mg/dL — ABNORMAL HIGH (ref 8–23)
CO2: 23 mmol/L (ref 22–32)
Calcium: 8.8 mg/dL — ABNORMAL LOW (ref 8.9–10.3)
Chloride: 101 mmol/L (ref 98–111)
Creatinine, Ser: 5.5 mg/dL — ABNORMAL HIGH (ref 0.44–1.00)
GFR, Estimated: 8 mL/min — ABNORMAL LOW (ref >60)
Glucose, Bld: 148 mg/dL — ABNORMAL HIGH (ref 70–99)
Potassium: 4.9 mmol/L (ref 3.5–5.1)
Sodium: 136 mmol/L (ref 135–145)

## 2024-06-27 LAB — TROPONIN T, HIGH SENSITIVITY: Troponin T High Sensitivity: 41 ng/L — ABNORMAL HIGH (ref 0–19)

## 2024-06-27 MED ORDER — CARVEDILOL 3.125 MG PO TABS
6.2500 mg | ORAL_TABLET | Freq: Two times a day (BID) | ORAL | Status: DC
  Administered 2024-06-27 – 2024-07-03 (×11): 6.25 mg via ORAL
  Filled 2024-06-27 (×14): qty 2

## 2024-06-27 MED ORDER — CHLORHEXIDINE GLUCONATE CLOTH 2 % EX PADS
6.0000 | MEDICATED_PAD | Freq: Every day | CUTANEOUS | Status: DC
  Administered 2024-06-27 – 2024-06-29 (×3): 6 via TOPICAL

## 2024-06-27 MED ORDER — ATORVASTATIN CALCIUM 40 MG PO TABS
40.0000 mg | ORAL_TABLET | Freq: Every morning | ORAL | Status: DC
  Administered 2024-06-27 – 2024-07-04 (×7): 40 mg via ORAL
  Filled 2024-06-27 (×8): qty 1

## 2024-06-27 MED ORDER — APIXABAN 2.5 MG PO TABS: 2.5000 mg | ORAL_TABLET | Freq: Two times a day (BID) | ORAL | Status: DC

## 2024-06-27 MED ORDER — APIXABAN 5 MG PO TABS
5.0000 mg | ORAL_TABLET | Freq: Two times a day (BID) | ORAL | Status: DC
  Administered 2024-06-27 (×2): 5 mg via ORAL
  Filled 2024-06-27 (×3): qty 1

## 2024-06-27 NOTE — Progress Notes (Signed)
 Notified provider that creatinine 5.5, patient not short of breath, but feels chest pressure.

## 2024-06-27 NOTE — Hospital Course (Signed)
 Brief Narrative:   63 year old with history of GAD, asthma, CAD status post PCI, GI bleed, DVT, bipolar, DM 2, peripheral neuropathy, HLD, HTN, GERD, CKD stage IV, gastroparesis, anemia of chronic disease and A-fib presents to the hospital with complaints of chest pain, diaphoresis and nausea.  It has been noted that patient has been having worsening renal function over the course of last several months.  Assessment & Plan:    Chest pain/CAD/STEMI with PCI hx -Prior history of PCI in [place] .  Has had ongoing history of off-and-on chest pain.  Currently troponins remain flat without any ischemic changes on the EKG.  Echocardiogram overall shows normal EF without any wall motion abnormalities.  At this time recommending medical therapy with Plavix , statin, Coreg , Imdur .  Patient will need outpatient close follow-up with cardiology team.   Elevated pro BNP  -No obvious evidence of exacerbation noted.  Echocardiogram shows preserved EF without any wall motion abnormalities  Acute anemia GI bleed - Hemoglobin down to 6.8 underwent EGD showing normal esophagus but had large amount of solid food therefore planning on repeat endoscopy today -PPI twice daily - PRBC transfusion : 10/13.  Will give additional unit to keep hemoglobin above 8 in the setting of advanced cardiac issues   A fib Paroxysmal Patient is in normal sinus rhythm.  Continue Coreg .  Holding Eliquis  in the setting of anemia   Type 2 diabetes mellitus with hyperglycemia A1c 6.5.  Adjust insulin  regimen as necessary  AKI on CKD stage V Patient's creatinine has fluctuated from 3.0-7.  Creatinine on 10/10 was 3.4, now trending up to 6.9.  Seen by nephrology, planning for HD catheter placement tomorrow with general surgery -Renal ultrasound-negative   Hyperkalemia ---With Lokelma   GAD/Bipolar Continue lamictal , sertraline , klonopin  Has planned psych appointment in 08/2024     DVT prophylaxis: apixaban , on hold Code  Status: Full  Family Communication:  Disposition: anticipate home once GI issues or renal issues are resolved.     PT Follow up Recs:   Subjective:  Seen at bedside, currently awaiting her endoscopy later today.  No other complaints  Examination:  General exam: Appears calm and comfortable  Respiratory system: Clear to auscultation. Respiratory effort normal. Cardiovascular system: S1 & S2 heard, RRR. No JVD, murmurs, rubs, gallops or clicks. No pedal edema. Gastrointestinal system: Abdomen is nondistended, soft and nontender. No organomegaly or masses felt. Normal bowel sounds heard. Central nervous system: Alert and oriented. No focal neurological deficits. Extremities: Symmetric 5 x 5 power. Skin: No rashes, lesions or ulcers Psychiatry: Judgement and insight appear normal. Mood & affect appropriate.

## 2024-06-27 NOTE — Plan of Care (Signed)
  Problem: Education: Goal: Ability to describe self-care measures that may prevent or decrease complications (Diabetes Survival Skills Education) will improve Outcome: Progressing   Problem: Coping: Goal: Ability to adjust to condition or change in health will improve Outcome: Progressing   Problem: Nutritional: Goal: Maintenance of adequate nutrition will improve Outcome: Progressing   Problem: Skin Integrity: Goal: Risk for impaired skin integrity will decrease Outcome: Progressing   Problem: Education: Goal: Knowledge of General Education information will improve Description: Including pain rating scale, medication(s)/side effects and non-pharmacologic comfort measures Outcome: Progressing   Problem: Clinical Measurements: Goal: Respiratory complications will improve Outcome: Progressing   Problem: Fluid Volume: Goal: Ability to maintain a balanced intake and output will improve Outcome: Not Progressing   Problem: Health Behavior/Discharge Planning: Goal: Ability to manage health-related needs will improve Outcome: Not Progressing   Problem: Metabolic: Goal: Ability to maintain appropriate glucose levels will improve Outcome: Not Progressing   Problem: Clinical Measurements: Goal: Ability to maintain clinical measurements within normal limits will improve Outcome: Not Progressing   Problem: Activity: Goal: Risk for activity intolerance will decrease Outcome: Not Progressing   Problem: Coping: Goal: Level of anxiety will decrease Outcome: Not Progressing

## 2024-06-27 NOTE — Progress Notes (Signed)
 PROGRESS NOTE   [name]  FMW:[id number] DOB: Jun 28, 1961 DOA: 06/25/2024 PCP: System, Provider Not In   Chief Complaint  Patient presents with   Chest Pain   Level of care: Telemetry  Brief Admission History:  63 y.o. female with medical history significant of generalized anxiety disorder with panic attacks, asthma. Chronic chest pain  CAD, STEMI with PCI x . GI bleed, DVT, Bipolar disorder, DM 2, neuropathy, hyperlipidemia, hypertension, GERD, CKD V, gastroparesis, anemia of chronic disease and a fib.  Patient reports over last 5-6 months has had worsening renal function has experienced decline in mobility tolerance, strength and dizziness and dyspnea when she gets up.  (Utilizes rolling seat walker with assistance from family members for home mobility)now progressed to stage 5. She is followed  with Dr Fernand nephrology in [place] .  This past Wednesday she experienced chest pain accompanied by diaphoresis and nausea. Chest pain described left side of her chest and her left arm. This event occurred again today and her nephrologist referred her to come here to [facility] as patient has appointment on 18th. Workup in ED troponin flat 33-30, chest pain resolved with fentanyl  (unable to tolerate nitroglycerin ),  BNP 1087 with normal respirations and sats on room air. Last ECHO reviewed in EPIC 05/06/2024 EF 60-65%, LVH grade I diastolic dysfunction.  Unfortunately her creatinine has been worsening and she reports difficulty urinating and poor urine production.  She is being admitted for further management.     Assessment and Plan:  Chest pain/CAD/STEMI with PCI hx -repeat EKG and troponin this AM -Continue imdur , coreg , plavix , eliquis , lipitor, plavix  Prn fentanyl  for chest pain Monitor on tele ACS has been ruled out    Elevated pro BNP  1087 Last echo mild LVH with EF 60-65% and grade I diastolic dysfunction 04/2024 - No current evidence of acute volume overload Repeat echo LVEF 60-65% with  no regional wall motion abnormalities and indeterminate diastolic function    A fib Paroxysmal Currently SR rate controlled Continue coreg  and eliquis  Monitor on tele   Type 2 diabetes mellitus with hyperglycemia Continue lantus  ACHS sliding scale insulin  - renal dose  Neurontin  for neuropathy hgbA1C--6.5%  CBG (last 3)  Recent Labs    06/26/24 2129 06/27/24 0738 06/27/24 1122  GLUCAP 166* 161* 165*   AKI on CKD stage V Patient not on HD, unfortunately she seems to be developing oliguria Foley cath inserted on 10/12 to rule out obstruction   Requesting inpatient nephrology consultation  Follow daily Renal Function Panel Follow up renal US     GAD/Bipolar Continue lamictal , sertraline , klonopin  Has planned psych appointment in 08/2024   GI bleed Reduce pepcid  to daily dosing due to renal function Protonix  continued Monitor for signs of bleeding and hgb trend  DVT prophylaxis: apixaban   Code Status: Full  Family Communication:  Disposition: anticipate home    Consultants:  nephrology  Procedures:   Antimicrobials:    Subjective: Pt says she does not feel well.  She is worried about her poor urine output/production.  Pt reports chest pressure but does not have pain at this time.    Objective: Vitals:   06/26/24 2126 06/27/24 0151 06/27/24 0428 06/27/24 1410  BP: (!) 149/64 (!) 109/43 139/64 131/76  Pulse: 67 66 65 71  Resp: 18 18 17 17   Temp: 98.2 F (36.8 C) 98 F (36.7 C) 98.3 F (36.8 C) 98.2 F (36.8 C)  TempSrc: Oral Oral Oral   SpO2: 100% 99% 98% 96%  Weight:  Height:        Intake/Output Summary (Last 24 hours) at 06/27/2024 1452 Last data filed at 06/27/2024 0830 Gross per 24 hour  Intake 660 ml  Output 200 ml  Net 460 ml   Filed Weights   06/25/24 2129  Weight: 104.1 kg   Examination:  General exam: Appears calm and comfortable  Respiratory system: Clear to auscultation. Respiratory effort normal. Cardiovascular system:  normal S1 & S2 heard. No JVD, murmurs, rubs, gallops or clicks. No pedal edema. Gastrointestinal system: Abdomen is nondistended, soft and nontender. No organomegaly or masses felt. Normal bowel sounds heard. Central nervous system: Alert and oriented. No focal neurological deficits. Extremities: Symmetric 5 x 5 power. Skin: No rashes, lesions or ulcers. Psychiatry: Judgement and insight appear normal. Mood & affect appropriate.   Data Reviewed: I have personally reviewed following labs and imaging studies  CBC: Recent Labs  Lab 06/25/24 2329  WBC 5.8  NEUTROABS 3.9  HGB 9.1*  HCT 27.1*  MCV 92.2  PLT 235    Basic Metabolic Panel: Recent Labs  Lab 06/25/24 2329 06/27/24 0426  NA 133* 136  K 4.8 4.9  CL 99 101  CO2 22 23  GLUCOSE 238* 148*  BUN 31* 43*  CREATININE 3.40* 5.50*  CALCIUM  9.5 8.8*  MG 1.9  --     CBG: Recent Labs  Lab 06/26/24 1149 06/26/24 1602 06/26/24 2129 06/27/24 0738 06/27/24 1122  GLUCAP 149* 167* 166* 161* 165*    No results found for this or any previous visit (from the past 240 hours).   Radiology Studies: US  RENAL Result Date: 06/27/2024 CLINICAL DATA:  [id number] AKI (acute kidney injury) [id number]. EXAM: RENAL / URINARY TRACT ULTRASOUND COMPLETE COMPARISON:  None Available. FINDINGS: Right Kidney: Renal measurements: 4.7 x 4.8 x 10.4 cm = volume: 121.6 mL. Echogenicity within normal limits. No mass or hydronephrosis visualized. Left Kidney: Renal measurements: 4.6 x 4.6 x 11.7 cm = volume: 128.1 mL. Echogenicity within normal limits. No mass or hydronephrosis visualized. Bladder: Appears normal for degree of bladder distention. Other: None. IMPRESSION: Unremarkable renal ultrasound. Electronically Signed   By: Ree Molt M.D.   On: 06/27/2024 10:24   DG Chest Port 1 View Result Date: 06/27/2024 EXAM: 1 VIEW(S) XRAY OF THE CHEST 06/27/2024 07:58:45 AM COMPARISON: 06/26/2024 CLINICAL HISTORY: Chest pain and pressure FINDINGS: LINES, TUBES  AND DEVICES: Monitor wires noted. LUNGS AND PLEURA: No focal pulmonary opacity. No pulmonary edema. No pleural effusion. No pneumothorax. HEART AND MEDIASTINUM: No acute abnormality of the cardiac and mediastinal silhouettes. BONES AND SOFT TISSUES: Intact thoracic cage with thoracic spondylosis and mild thoracic dextroscoliosis. IMPRESSION: 1. No acute cardiopulmonary abnormality. Electronically signed by: Waddell Calk MD 06/27/2024 08:14 AM EDT RP Workstation: [id number]   ECHOCARDIOGRAM COMPLETE Result Date: 06/26/2024    ECHOCARDIOGRAM REPORT   Patient Name:   [name] Date of Exam: 06/26/2024 Medical Rec #:  [id number]      Height:       63.0 in Accession #:    [id number]     Weight:       229.5 lb Date of Birth:  1960/10/07      BSA:          2.050 m Patient Age:    63 years       BP:           123/91 mmHg Patient Gender: F              HR:  66 bpm. Exam Location:  [facility] Procedure: 2D Echo, Cardiac Doppler and Color Doppler (Both Spectral and Color            Flow Doppler were utilized during procedure). Indications:    CHF-Acute Diastolic I50.31  History:        Patient has prior history of Echocardiogram examinations, most                 recent 05/06/2024. Arrythmias:Atrial Fibrillation; Risk                 Factors:Hypertension and Diabetes.  Sonographer:    Jayson Gaskins Referring Phys: [id number] BRENDA MORRISON IMPRESSIONS  1. Left ventricular ejection fraction, by estimation, is 60 to 65%. The left ventricle has normal function. The left ventricle has no regional wall motion abnormalities. There is mild concentric left ventricular hypertrophy. Left ventricular diastolic parameters are indeterminate.  2. Right ventricular systolic function is normal. The right ventricular size is normal. There is normal pulmonary artery systolic pressure. The estimated right ventricular systolic pressure is 26.5 mmHg.  3. The mitral valve is grossly normal. Trivial mitral valve regurgitation.  4. The  aortic valve is tricuspid. There is mild calcification of the aortic valve. Aortic valve regurgitation is not visualized. Aortic valve sclerosis is present, with no evidence of aortic valve stenosis. Aortic valve mean gradient measures 5.0 mmHg.  5. The inferior vena cava is normal in size with <50% respiratory variability, suggesting right atrial pressure of 8 mmHg. Comparison(s): Prior images reviewed side by side. LVEF 60-65%. Normal estimated RVSP. FINDINGS  Left Ventricle: Left ventricular ejection fraction, by estimation, is 60 to 65%. The left ventricle has normal function. The left ventricle has no regional wall motion abnormalities. The left ventricular internal cavity size was normal in size. There is  mild concentric left ventricular hypertrophy. Left ventricular diastolic parameters are indeterminate. Right Ventricle: The right ventricular size is normal. No increase in right ventricular wall thickness. Right ventricular systolic function is normal. There is normal pulmonary artery systolic pressure. The tricuspid regurgitant velocity is 2.15 m/s, and  with an assumed right atrial pressure of 8 mmHg, the estimated right ventricular systolic pressure is 26.5 mmHg. Left Atrium: Left atrial size was normal in size. Right Atrium: Right atrial size was normal in size. Pericardium: There is no evidence of pericardial effusion. Presence of epicardial fat layer. Mitral Valve: The mitral valve is grossly normal. Trivial mitral valve regurgitation. Tricuspid Valve: The tricuspid valve is grossly normal. Tricuspid valve regurgitation is trivial. Aortic Valve: The aortic valve is tricuspid. There is mild calcification of the aortic valve. Aortic valve regurgitation is not visualized. Aortic valve sclerosis is present, with no evidence of aortic valve stenosis. Aortic valve mean gradient measures 5.0 mmHg. Aortic valve peak gradient measures 7.8 mmHg. Aortic valve area, by VTI measures 2.09 cm. Pulmonic Valve: The  pulmonic valve was not well visualized. Pulmonic valve regurgitation is trivial. Aorta: The aortic root is normal in size and structure. Venous: The inferior vena cava was not well visualized. The inferior vena cava is normal in size with less than 50% respiratory variability, suggesting right atrial pressure of 8 mmHg. IAS/Shunts: No atrial level shunt detected by color flow Doppler. Additional Comments: 3D was performed not requiring image post processing on an independent workstation and was indeterminate.  LEFT VENTRICLE PLAX 2D LVIDd:         4.60 cm   Diastology LVIDs:         3.00 cm  LV e' medial:    4.79 cm/s LV PW:         1.10 cm   LV E/e' medial:  18.4 LV IVS:        1.10 cm   LV e' lateral:   6.74 cm/s LVOT diam:     2.00 cm   LV E/e' lateral: 13.1 LV SV:         77 LV SV Index:   38 LVOT Area:     3.14 cm  RIGHT VENTRICLE RV S prime:     13.80 cm/s TAPSE (M-mode): 2.1 cm LEFT ATRIUM           Index        RIGHT ATRIUM           Index LA Vol (A2C): 66.4 ml 32.39 ml/m  RA Area:     14.90 cm LA Vol (A4C): 37.9 ml 18.49 ml/m  RA Volume:   38.70 ml  18.88 ml/m  AORTIC VALVE AV Area (Vmax):    2.18 cm AV Area (Vmean):   2.15 cm AV Area (VTI):     2.09 cm AV Vmax:           140.00 cm/s AV Vmean:          112.000 cm/s AV VTI:            0.370 m AV Peak Grad:      7.8 mmHg AV Mean Grad:      5.0 mmHg LVOT Vmax:         97.20 cm/s LVOT Vmean:        76.500 cm/s LVOT VTI:          0.246 m LVOT/AV VTI ratio: 0.66  AORTA Ao Root diam: 2.90 cm MITRAL VALVE               TRICUSPID VALVE MV Area (PHT): 2.39 cm    TR Peak grad:   18.5 mmHg MV Decel Time: 318 msec    TR Vmax:        215.00 cm/s MV E velocity: 88.10 cm/s MV A velocity: 84.30 cm/s  SHUNTS MV E/A ratio:  1.05        Systemic VTI:  0.25 m                            Systemic Diam: 2.00 cm Jayson Sierras MD Electronically signed by Jayson Sierras MD Signature Date/Time: 06/26/2024/3:25:41 PM    Final    CT ABDOMEN PELVIS WO CONTRAST Result  Date: 06/26/2024 CLINICAL DATA:  Chest pain EXAM: CT ABDOMEN AND PELVIS WITHOUT CONTRAST TECHNIQUE: Multidetector CT imaging of the abdomen and pelvis was performed following the standard protocol without IV contrast. RADIATION DOSE REDUCTION: This exam was performed according to the departmental dose-optimization program which includes automated exposure control, adjustment of the mA and/or kV according to patient size and/or use of iterative reconstruction technique. COMPARISON:  05/28/2024 FINDINGS: Lower chest: No acute abnormality. Hepatobiliary: No focal liver abnormality is seen. Status post cholecystectomy. No biliary dilatation. Pancreas: Unremarkable. No pancreatic ductal dilatation or surrounding inflammatory changes. Spleen: Normal in size without focal abnormality. Adrenals/Urinary Tract: Adrenal glands are within normal limits. Kidneys are well visualized bilaterally. The bladder is decompressed. Punctate nonobstructing renal stones are noted in the lower poles bilaterally. Stomach/Bowel: No obstructive or inflammatory changes of the colon are noted. The appendix has been surgically removed. Small bowel and stomach are within normal  limits. Vascular/Lymphatic: Aortic atherosclerosis. No enlarged abdominal or pelvic lymph nodes. Reproductive: Status post hysterectomy. No adnexal masses. Other: No abdominal wall hernia or abnormality. No abdominopelvic ascites. Musculoskeletal: No acute or significant osseous findings. IMPRESSION: Punctate nonobstructing renal calculi. No other focal abnormality is noted. Electronically Signed   By: Oneil Devonshire M.D.   On: 06/26/2024 02:40   DG Chest Portable 1 View Result Date: 06/26/2024 CLINICAL DATA:  Chest pain for several hours, initial encounter EXAM: PORTABLE CHEST 1 VIEW COMPARISON:  05/05/2024 FINDINGS: The heart size and mediastinal contours are within normal limits. Both lungs are clear. The visualized skeletal structures are unremarkable. IMPRESSION: No  active disease. Electronically Signed   By: Oneil Devonshire M.D.   On: 06/26/2024 02:37    Scheduled Meds:  apixaban   5 mg Oral BID   atorvastatin   40 mg Oral q morning   carvedilol   6.25 mg Oral BID WC   Chlorhexidine Gluconate Cloth  6 each Topical Daily   clopidogrel   75 mg Oral Daily   famotidine   20 mg Oral Daily   insulin  aspart  0-9 Units Subcutaneous TID AC & HS   isosorbide  mononitrate  60 mg Oral q morning   lamoTRIgine   100 mg Oral BID   pantoprazole   40 mg Oral BID AC   sertraline   100 mg Oral Daily   Continuous Infusions:   LOS: 1 day   Time spent: 57 mins  Marlyn Tondreau Vicci, MD How to contact the [facility] Attending or Consulting provider 7A - 7P or covering provider during after hours 7P -7A, for this patient?  Check the care team in [facility] and look for a) attending/consulting TRH provider listed and b) the TRH team listed Log into [email or web address] to find provider on call.  Locate the TRH provider you are looking for under [facility] and page to a number that you can be directly reached. If you still have difficulty reaching the provider, please page the [facility] (Director on Call) for the Hospitalists listed on amion for assistance.  06/27/2024, 2:52 PM

## 2024-06-27 NOTE — Progress Notes (Signed)
 Notified provider of post void residual of 116 ml.

## 2024-06-28 ENCOUNTER — Encounter (HOSPITAL_COMMUNITY): Payer: Self-pay | Admitting: Acute Care

## 2024-06-28 DIAGNOSIS — I202 Refractory angina pectoris: Secondary | ICD-10-CM | POA: Diagnosis not present

## 2024-06-28 DIAGNOSIS — R1032 Left lower quadrant pain: Secondary | ICD-10-CM

## 2024-06-28 DIAGNOSIS — K529 Noninfective gastroenteritis and colitis, unspecified: Secondary | ICD-10-CM

## 2024-06-28 DIAGNOSIS — R0789 Other chest pain: Secondary | ICD-10-CM | POA: Diagnosis not present

## 2024-06-28 DIAGNOSIS — D649 Anemia, unspecified: Secondary | ICD-10-CM

## 2024-06-28 DIAGNOSIS — I2510 Atherosclerotic heart disease of native coronary artery without angina pectoris: Secondary | ICD-10-CM | POA: Diagnosis not present

## 2024-06-28 DIAGNOSIS — I480 Paroxysmal atrial fibrillation: Secondary | ICD-10-CM

## 2024-06-28 DIAGNOSIS — Z7901 Long term (current) use of anticoagulants: Secondary | ICD-10-CM | POA: Diagnosis not present

## 2024-06-28 DIAGNOSIS — I10 Essential (primary) hypertension: Secondary | ICD-10-CM | POA: Diagnosis not present

## 2024-06-28 DIAGNOSIS — N179 Acute kidney failure, unspecified: Secondary | ICD-10-CM | POA: Diagnosis not present

## 2024-06-28 DIAGNOSIS — R079 Chest pain, unspecified: Secondary | ICD-10-CM | POA: Diagnosis not present

## 2024-06-28 LAB — CBC
HCT: 20.7 % — ABNORMAL LOW (ref 36.0–46.0)
Hemoglobin: 6.8 g/dL — CL (ref 12.0–15.0)
MCH: 30.8 pg (ref 26.0–34.0)
MCHC: 32.9 g/dL (ref 30.0–36.0)
MCV: 93.7 fL (ref 80.0–100.0)
Platelets: 152 K/uL (ref 150–400)
RBC: 2.21 MIL/uL — ABNORMAL LOW (ref 3.87–5.11)
RDW: 16.3 % — ABNORMAL HIGH (ref 11.5–15.5)
WBC: 6.2 K/uL (ref 4.0–10.5)
nRBC: 0 % (ref 0.0–0.2)

## 2024-06-28 LAB — GLUCOSE, CAPILLARY
Glucose-Capillary: 171 mg/dL — ABNORMAL HIGH (ref 70–99)
Glucose-Capillary: 198 mg/dL — ABNORMAL HIGH (ref 70–99)
Glucose-Capillary: 231 mg/dL — ABNORMAL HIGH (ref 70–99)
Glucose-Capillary: 182 mg/dL — ABNORMAL HIGH (ref 70–99)

## 2024-06-28 LAB — SEDIMENTATION RATE: Sed Rate: >140 mm/h — ABNORMAL HIGH (ref 0–30)

## 2024-06-28 LAB — C-REACTIVE PROTEIN: CRP: 4.6 mg/dL — ABNORMAL HIGH (ref <1.0)

## 2024-06-28 LAB — RENAL FUNCTION PANEL
Albumin: 3.3 g/dL — ABNORMAL LOW (ref 3.5–5.0)
Anion gap: 14 (ref 5–15)
BUN: 51 mg/dL — ABNORMAL HIGH (ref 8–23)
CO2: 21 mmol/L — ABNORMAL LOW (ref 22–32)
Calcium: 8.7 mg/dL — ABNORMAL LOW (ref 8.9–10.3)
Chloride: 102 mmol/L (ref 98–111)
Creatinine, Ser: 6.89 mg/dL — ABNORMAL HIGH (ref 0.44–1.00)
GFR, Estimated: 6 mL/min — ABNORMAL LOW (ref >60)
Glucose, Bld: 165 mg/dL — ABNORMAL HIGH (ref 70–99)
Phosphorus: 5.7 mg/dL — ABNORMAL HIGH (ref 2.5–4.6)
Potassium: 5.3 mmol/L — ABNORMAL HIGH (ref 3.5–5.1)
Sodium: 137 mmol/L (ref 135–145)

## 2024-06-28 LAB — IRON AND TIBC
Iron: 32 ug/dL (ref 28–170)
Saturation Ratios: 15 % (ref 10.4–31.8)
TIBC: 214 ug/dL — ABNORMAL LOW (ref 250–450)
UIBC: 182 ug/dL

## 2024-06-28 LAB — FERRITIN: Ferritin: 326 ng/mL — ABNORMAL HIGH (ref 11–307)

## 2024-06-28 LAB — PREPARE RBC (CROSSMATCH)

## 2024-06-28 MED ORDER — SODIUM CHLORIDE 0.9% IV SOLUTION: Freq: Once | INTRAVENOUS | Status: DC

## 2024-06-28 MED ORDER — ISOSORBIDE MONONITRATE ER 30 MG PO TB24
30.0000 mg | ORAL_TABLET | Freq: Once | ORAL | Status: AC
  Administered 2024-06-28: 30 mg via ORAL
  Filled 2024-06-28: qty 1

## 2024-06-28 MED ORDER — DARBEPOETIN ALFA 100 MCG/0.5ML IJ SOSY
100.0000 ug | PREFILLED_SYRINGE | INTRAMUSCULAR | Status: DC
Start: 2024-06-28 — End: 2024-07-04
  Administered 2024-06-28: 100 ug via SUBCUTANEOUS
  Filled 2024-06-28: qty 0.5

## 2024-06-28 MED ORDER — SODIUM ZIRCONIUM CYCLOSILICATE 10 G PO PACK
10.0000 g | PACK | Freq: Three times a day (TID) | ORAL | Status: AC
  Administered 2024-06-28 (×2): 10 g via ORAL
  Filled 2024-06-28 (×2): qty 1

## 2024-06-28 MED ORDER — ISOSORBIDE MONONITRATE ER 60 MG PO TB24
90.0000 mg | ORAL_TABLET | Freq: Every morning | ORAL | Status: DC
  Administered 2024-06-29 – 2024-07-04 (×5): 90 mg via ORAL
  Filled 2024-06-28 (×6): qty 1

## 2024-06-28 NOTE — Progress Notes (Signed)
 PROGRESS NOTE   [name]  FMW:[id number] DOB: 02-Jan-1961 DOA: 06/25/2024 PCP: System, Provider Not In   Chief Complaint  Patient presents with   Chest Pain   Level of care: Telemetry  Brief Admission History:  63 y.o. female with medical history significant of generalized anxiety disorder with panic attacks, asthma. Chronic chest pain  CAD, STEMI with PCI x . GI bleed, DVT, Bipolar disorder, DM 2, neuropathy, hyperlipidemia, hypertension, GERD, CKD V, gastroparesis, anemia of chronic disease and a fib.  Patient reports over last 5-6 months has had worsening renal function has experienced decline in mobility tolerance, strength and dizziness and dyspnea when she gets up.  (Utilizes rolling seat walker with assistance from family members for home mobility)now progressed to stage 5. She is followed  with Dr Fernand nephrology in [place] .  This past Wednesday she experienced chest pain accompanied by diaphoresis and nausea. Chest pain described left side of her chest and her left arm. This event occurred again today and her nephrologist referred her to come here to [facility] as patient has appointment on 18th. Workup in ED troponin flat 33-30, chest pain resolved with fentanyl  (unable to tolerate nitroglycerin ),  BNP 1087 with normal respirations and sats on room air. Last ECHO reviewed in EPIC 05/06/2024 EF 60-65%, LVH grade I diastolic dysfunction.  Unfortunately her creatinine has been worsening and she reports difficulty urinating and poor urine production.  She is being admitted for further management.     Assessment and Plan:  Chest pain/CAD/STEMI with PCI hx -repeat EKG and troponin this AM -Continue imdur , coreg , plavix , eliquis , lipitor, plavix  Prn fentanyl  for chest pain Monitor on tele ACS has been ruled out  -appreciate cardiology team consult and recommendations   Elevated pro BNP  1087 Last echo mild LVH with EF 60-65% and grade I diastolic dysfunction 04/2024 - No current  evidence of acute volume overload Repeat echo LVEF 60-65% with no regional wall motion abnormalities and indeterminate diastolic function    A fib Paroxysmal Currently SR rate controlled Continue coreg  but HOLD apixaban  given drop in Hg to 6.8 Monitor on tele   Type 2 diabetes mellitus with hyperglycemia Continue lantus  ACHS sliding scale insulin  - renal dose  Neurontin  for neuropathy hgbA1C--6.5%  CBG (last 3)  Recent Labs    06/27/24 2245 06/28/24 0728 06/28/24 1133  GLUCAP 180* 171* 198*   AKI on CKD stage V Patient not on HD, unfortunately creatinine worsening Foley cath inserted on 10/12 to rule out obstruction   Requesting inpatient nephrology consultation  Follow daily Renal Function Panel Follow up renal US  no obstruction seen   GAD/Bipolar Continue lamictal , sertraline , klonopin  Has planned psych appointment in 08/2024   GI bleed Reduce pepcid  to daily dosing due to renal function Protonix  continued Monitor for signs of bleeding and hgb trend Appreciate GI consult and plan for EGD in next 72 hours to allow apixaban  washout  DVT prophylaxis: apixaban   Code Status: Full  Family Communication:  Disposition: anticipate home    Consultants:  nephrology  Procedures:   Antimicrobials:    Subjective: Pt says she doesn't fell well, she says she still has intermittent chest pain but denies SOB.     Objective: Vitals:   06/28/24 0406 06/28/24 0634 06/28/24 0941 06/28/24 1255  BP:   (!) 146/70 (!) 147/63  Pulse:   79 73  Resp: 17   18  Temp:    97.9 F (36.6 C)  TempSrc:    Oral  SpO2:  98%  Weight:  100.5 kg    Height:        Intake/Output Summary (Last 24 hours) at 06/28/2024 1524 Last data filed at 06/28/2024 1400 Gross per 24 hour  Intake 900 ml  Output 1500 ml  Net -600 ml   Filed Weights   06/25/24 2129 06/28/24 0634  Weight: 104.1 kg 100.5 kg   Examination:  General exam: Appears calm and comfortable  Respiratory system: Clear to  auscultation. Respiratory effort normal. Cardiovascular system: normal S1 & S2 heard. No JVD, murmurs, rubs, gallops or clicks. 1+ pedal edema. Gastrointestinal system: Abdomen is nondistended, soft and nontender. No organomegaly or masses felt. Normal bowel sounds heard. Central nervous system: Alert and oriented. No focal neurological deficits. Extremities: Symmetric 5 x 5 power. Skin: No rashes, lesions or ulcers. Psychiatry: Judgement and insight appear normal. Mood & affect appropriate.   Data Reviewed: I have personally reviewed following labs and imaging studies  CBC: Recent Labs  Lab 06/25/24 2329 06/28/24 0414  WBC 5.8 6.2  NEUTROABS 3.9  --   HGB 9.1* 6.8*  HCT 27.1* 20.7*  MCV 92.2 93.7  PLT 235 152    Basic Metabolic Panel: Recent Labs  Lab 06/25/24 2329 06/27/24 0426 06/28/24 0246  NA 133* 136 137  K 4.8 4.9 5.3*  CL 99 101 102  CO2 22 23 21*  GLUCOSE 238* 148* 165*  BUN 31* 43* 51*  CREATININE 3.40* 5.50* 6.89*  CALCIUM  9.5 8.8* 8.7*  MG 1.9  --   --   PHOS  --   --  5.7*    CBG: Recent Labs  Lab 06/27/24 1630 06/27/24 2005 06/27/24 2245 06/28/24 0728 06/28/24 1133  GLUCAP 218* 204* 180* 171* 198*    No results found for this or any previous visit (from the past 240 hours).   Radiology Studies: US  RENAL Result Date: 06/27/2024 CLINICAL DATA:  [id number] AKI (acute kidney injury) [id number]. EXAM: RENAL / URINARY TRACT ULTRASOUND COMPLETE COMPARISON:  None Available. FINDINGS: Right Kidney: Renal measurements: 4.7 x 4.8 x 10.4 cm = volume: 121.6 mL. Echogenicity within normal limits. No mass or hydronephrosis visualized. Left Kidney: Renal measurements: 4.6 x 4.6 x 11.7 cm = volume: 128.1 mL. Echogenicity within normal limits. No mass or hydronephrosis visualized. Bladder: Appears normal for degree of bladder distention. Other: None. IMPRESSION: Unremarkable renal ultrasound. Electronically Signed   By: Ree Molt M.D.   On: 06/27/2024 10:24   DG  Chest Port 1 View Result Date: 06/27/2024 EXAM: 1 VIEW(S) XRAY OF THE CHEST 06/27/2024 07:58:45 AM COMPARISON: 06/26/2024 CLINICAL HISTORY: Chest pain and pressure FINDINGS: LINES, TUBES AND DEVICES: Monitor wires noted. LUNGS AND PLEURA: No focal pulmonary opacity. No pulmonary edema. No pleural effusion. No pneumothorax. HEART AND MEDIASTINUM: No acute abnormality of the cardiac and mediastinal silhouettes. BONES AND SOFT TISSUES: Intact thoracic cage with thoracic spondylosis and mild thoracic dextroscoliosis. IMPRESSION: 1. No acute cardiopulmonary abnormality. Electronically signed by: Waddell Calk MD 06/27/2024 08:14 AM EDT RP Workstation: [id number]    Scheduled Meds:  sodium chloride    Intravenous Once   atorvastatin   40 mg Oral q morning   carvedilol   6.25 mg Oral BID WC   Chlorhexidine Gluconate Cloth  6 each Topical Daily   clopidogrel   75 mg Oral Daily   darbepoetin (ARANESP ) injection - DIALYSIS  100 mcg Subcutaneous Q Mon-1800   famotidine   20 mg Oral Daily   insulin  aspart  0-9 Units Subcutaneous TID AC & HS   [START ON  06/29/2024] isosorbide  mononitrate  90 mg Oral q morning   lamoTRIgine   100 mg Oral BID   pantoprazole   40 mg Oral BID AC   sertraline   100 mg Oral Daily   sodium zirconium cyclosilicate  10 g Oral TID   Continuous Infusions:   LOS: 2 days   Time spent: 62 mins  Eldred Lievanos Vicci, MD How to contact the [facility] Attending or Consulting provider 7A - 7P or covering provider during after hours 7P -7A, for this patient?  Check the care team in [facility] and look for a) attending/consulting TRH provider listed and b) the TRH team listed Log into [email or web address] to find provider on call.  Locate the TRH provider you are looking for under [facility] and page to a number that you can be directly reached. If you still have difficulty reaching the provider, please page the [facility] (Director on Call) for the Hospitalists listed on amion for assistance.  06/28/2024, 3:24 PM

## 2024-06-28 NOTE — Consult Note (Signed)
 Gastroenterology Consult   Referring Provider: Dr. Vicci Primary Care Physician:  System, Provider Not In Primary Gastroenterologist:  unassigned  Patient ID: Monnica Saltsman; [id number]; 11-24-1960   Admit date: 06/25/2024  LOS: 2 days   Date of Consultation: 06/28/2024  Reason for Consultation:  GI bleed  History of Present Illness   Sneha Shizue Kaseman is a 63 y.o. year old female with past medical history of CAD, NSTEMI s/p DES 2022, DVT, DM, HLD, HTN, GERD, CKD with likely need for dialysis in future, gastroparesis, asthma, bipolar, afib on Eliquis , anemia with chronic disease and IDA, multiple blood transfusions and iron infusions over course of this year, presenting this admission with chest pain and LLQ abdominal pain. GI has been consulted as Hgb has dropped down to 6.8 today from 9.1 on presentation 10/10.   Hospital course: On admission Hgb 9.1, down to 6.8 when rechecked this morning on 10/13. Appears baseline 7/8 range. 1 unit PRBCs has been ordered but due to screen positive for antibodies, this sample has been sent to [facility] for further screening.   No thrombocytopenia. Creatinine 3.4 on admission and trending up, now 6.89, LFTs normal and have been normal historically, BNP over 1000, troponin elevated in 30s, low 41. US  with trace leukocytes, negative nitrites, rare bacteria,   CT 10/11 without contrast: nonobstructing renal calculi, otherwise no acute findings.   Nephrology and Cardiology have been consulted and following.   Eliquis  on hold as of this morning, 10.13, and last dose 10/12 in the evening. Remains on Plavix .   Today at consultation: She reports presenting to the ED with recurrent chest pain and LLQ abdominal pain. No overt GI bleeding. Stool is brown. Prior blood transfusions this year, EGD per notes in March/April in [place] with gastric polyps and felt this was source for anemia. Black stool in the spring. Was having IV iron infusions while in [place] . Moved  here in 2022 after stents placed. Last colonoscopy over 10 years ago. No capsule study she is aware.   No melena or hematochezia. LLQ abdominal pain is episodic, noted recurrent starting a few days ago. She is unable to identify any provoking factors. Feels at its worst a 6/10. Feels mild improvement after BM but then returns.  States she has this pain off and on for a few days during episodes. No constipation. No diarrhea. BM 2-3 times per day. Soft. No straining.  Feels like she is emptying out well. No pain after eating. No NSAIDs.   For the past year has had Intermittent nausea. Takes zofran . Hx of gastroparesis. Poor appetite. Believes she has lost weight but unable to report how much. No dysphagia. On pantoprazole  as outpatient BID.   Ferritin as low as 19 a year ago. States she hasn't had A1c checked in awhile. Sees PCP at [facility]. Difficulty controlling diabetes.   Notably, strong family history of cirrhosis in siblings and mother, not associated with alcohol. Prior imaging reviewed and although spleen reported as normal on non-contrast CT this admission, she has had evidence for hepatic steatosis and HSM on multiple prior CTs . No thrombocytopenia.    CT 06/22/24 at [facility]: suspected enteritis of proximal mid small bowel, likely infectious. Location mid jejunum to proximal mid ileum. Normal liver.    Colonoscopy at [facility] 2014: complete to cecum, hemorrhoids, otherwise normal EGD at [facility] 2014: normal esophagus, sessile 5-6 mm polypoid lesions in antrum s/p biopsy, normal duodenum s/p biopsy,    Mother: colon polyps, UGI bleeding, Cirrhosis  Brother and sister: both had cirrhosis. Did not drink.  Brother: deceased from liver cancer, has hx of cirrhosis.  Sister: problems with bowels, ?IBD   Past Medical History:  Diagnosis Date   Anxiety    Asthma    Atrial fibrillation (HCC)    Bipolar 1 disorder (HCC)    Chronic back pain    Chronic chest pain    Diabetes mellitus without  complication (HCC)    DVT (deep venous thrombosis) (HCC) 2017   Gastroparesis    GERD (gastroesophageal reflux disease)    GI bleed 11/2023   required 2 units PRBC transfusion   Hyperlipemia    Hypertension    IBS (irritable bowel syndrome)    Normal cardiac stress test 02/14/2014   [facility]   Panic attacks     Past Surgical History:  Procedure Laterality Date   ABDOMINAL HYSTERECTOMY  2001   APPENDECTOMY  10/2005   CHOLECYSTECTOMY  10/2005   COLONOSCOPY  2007   [facility] ([name])-pt reports hemorrhoids   ESOPHAGOGASTRODUODENOSCOPY  02/28/2006   [name], normal on daily PPI, myultiple hyperplastic polyps   MOUTH SURGERY     RIGHT HEART CATH N/A 04/25/2023   Procedure: RIGHT HEART CATH;  Surgeon: Verlin Lonni BIRCH, MD;  Location: [facility] INVASIVE CV LAB;  Service: Cardiovascular;  Laterality: N/A;   RIGHT OOPHORECTOMY  1999    Prior to Admission medications   Medication Sig Start Date End Date Taking? Authorizing Provider  albuterol  (VENTOLIN  HFA) 108 (90 Base) MCG/ACT inhaler Inhale 2 puffs into the lungs every 6 (six) hours as needed for wheezing or shortness of breath.   Yes [provider]  apixaban  (ELIQUIS ) 5 MG TABS tablet Take 1 tablet (5 mg total) by mouth 2 (two) times daily. 11/17/17  Yes Suzette Pac, MD  atorvastatin  (LIPITOR) 40 MG tablet Take 40 mg by mouth every morning.   Yes [provider]  carvedilol  (COREG ) 25 MG tablet Take 0.5 tablets (12.5 mg total) by mouth 2 (two) times daily with a meal. Patient taking differently: Take 12.5 mg by mouth daily with breakfast. Patient takes whole pill 25 mg BID 05/03/23  Yes Tobie Paticia BROCKS, MD  clonazePAM  (KLONOPIN ) 0.5 MG tablet Take 1 tablet (0.5 mg total) by mouth 2 (two) times daily as needed for anxiety. 05/18/24  Yes Pollina, Lonni PARAS, MD  clopidogrel  (PLAVIX ) 75 MG tablet Take 75 mg by mouth daily.   Yes [provider]  ergocalciferol (VITAMIN D2) 1.25 MG (50000 UT) capsule Take  50,000 Units by mouth every 30 (thirty) days. 06/01/24 06/01/25 Yes [provider]  famotidine  (PEPCID ) 20 MG tablet Take 20 mg by mouth 2 (two) times daily.   Yes [provider]  gabapentin  (NEURONTIN ) 300 MG capsule Take 300 mg by mouth 3 (three) times daily. Patient taking differently: Take 300 mg by mouth 3 (three) times daily. Reduced to BID by MD   Yes [provider]  hydrOXYzine  (ATARAX ) 25 MG tablet Take 25 mg by mouth every 12 (twelve) hours as needed for anxiety.   Yes [provider]  isosorbide  mononitrate (IMDUR ) 60 MG 24 hr tablet Take 1 tablet (60 mg total) by mouth every morning. 05/09/24  Yes Tat, Alm, MD  lamoTRIgine  (LAMICTAL ) 100 MG tablet Take 100 mg by mouth 2 (two) times daily.   Yes [provider]  LANTUS  SOLOSTAR 100 UNIT/ML Solostar Pen Inject 50 Units into the skin 2 (two) times daily.   Yes [provider]  NOVOLOG   FLEXPEN 100 UNIT/ML FlexPen Inject 15 Units into the skin 4 (four) times daily. 4-5 times daily 05/13/15  Yes [provider]  ondansetron  (ZOFRAN ) 4 MG tablet Take 1 tablet (4 mg total) by mouth every 6 (six) hours as needed for nausea or vomiting. 05/28/24  Yes Jerral Meth, MD  ondansetron  (ZOFRAN -ODT) 4 MG disintegrating tablet Take 2 mg by mouth every 6 (six) hours as needed. 06/20/24  Yes [provider]  pantoprazole  (PROTONIX ) 40 MG tablet Take 40 mg by mouth 2 (two) times daily before a meal.   Yes [provider]  promethazine  (PHENERGAN ) 25 MG tablet Take 25 mg by mouth every 6 (six) hours as needed for nausea or vomiting.   Yes [provider]  sertraline  (ZOLOFT ) 100 MG tablet Take 100 mg by mouth daily.   Yes [provider]  SYMBICORT 160-4.5 MCG/ACT inhaler Inhale 2 puffs into the lungs daily at 6 (six) AM. Patient not taking: Reported on 06/26/2024 02/03/24   [provider]    Current Facility-Administered Medications  Medication  Dose Route Frequency Provider Last Rate Last Admin   0.9 %  sodium chloride  infusion (Manually program via Guardrails IV Fluids)   Intravenous Once Adefeso, Oladapo, DO   Held at 06/28/24 0740   atorvastatin  (LIPITOR) tablet 40 mg  40 mg Oral q morning Johnson, Clanford L, MD   40 mg at 06/28/24 0940   carvedilol  (COREG ) tablet 6.25 mg  6.25 mg Oral BID WC Johnson, Clanford L, MD   6.25 mg at 06/28/24 0940   Chlorhexidine Gluconate Cloth 2 % PADS 6 each  6 each Topical Daily Johnson, Clanford L, MD   6 each at 06/28/24 9057   clonazePAM  (KLONOPIN ) tablet 0.5 mg  0.5 mg Oral BID PRN Jesus America, NP       clopidogrel  (PLAVIX ) tablet 75 mg  75 mg Oral Daily Jesus America, NP   75 mg at 06/27/24 9157   Darbepoetin Alfa  (ARANESP ) injection 100 mcg  100 mcg Subcutaneous Q Mon-1800 Marlee Motto B, MD       famotidine  (PEPCID ) tablet 20 mg  20 mg Oral Daily Jesus America, NP   20 mg at 06/28/24 0941   fentaNYL  (SUBLIMAZE ) injection 25 mcg  25 mcg Intravenous Q2H PRN Vicci, Clanford L, MD   25 mcg at 06/28/24 0941   insulin  aspart (novoLOG ) injection 0-9 Units  0-9 Units Subcutaneous TID AC & HS Jesus America, NP   2 Units at 06/28/24 9060   isosorbide  mononitrate (IMDUR ) 24 hr tablet 60 mg  60 mg Oral q morning Jesus America, NP   60 mg at 06/28/24 0940   lamoTRIgine  (LAMICTAL ) tablet 100 mg  100 mg Oral BID Jesus America, NP   100 mg at 06/28/24 0941   pantoprazole  (PROTONIX ) EC tablet 40 mg  40 mg Oral BID AC Morrison, Brenda, NP   40 mg at 06/28/24 0941   sertraline  (ZOLOFT ) tablet 100 mg  100 mg Oral Daily Jesus America, NP   100 mg at 06/28/24 0940   sodium zirconium cyclosilicate (LOKELMA) packet 10 g  10 g Oral TID Vicci Pen L, MD   10 g at 06/28/24 0940    Allergies as of 06/25/2024 - Review Complete 06/25/2024  Allergen Reaction Noted   Gelatin Swelling 09/10/2014   Iodinated contrast media Anaphylaxis, Hives, Itching, and Swelling 10/28/2014    Methylprednisolone  sodium succ Shortness Of Breath 05/21/2016   Dicyclomine hcl Anxiety and Other (See Comments) 10/14/2014  Doxycycline Hives and Rash 05/21/2016   Glutamic acid Itching and Swelling 07/14/2017   Ketorolac Hives and Rash 05/21/2016   Lisinopril  Swelling 03/31/2017   Metformin Nausea And Vomiting and Other (See Comments) 10/14/2014   Nitroglycerin  Nausea And Vomiting and Swelling 09/10/2014   Nsaids Other (See Comments) 07/14/2017   Other Itching, Swelling, and Other (See Comments) 05/21/2016   Quetiapine Other (See Comments) 05/28/2023   Vancomycin  11/13/2020   Blueberry flavoring agent (non-screening) Nausea And Vomiting and Rash 07/14/2017   Cephalexin  Nausea And Vomiting and Nausea Only 07/14/2017   Fish allergy Rash 11/05/2020   Ibuprofen Nausea And Vomiting 10/21/2006   Nitrofuran derivatives Itching and Nausea And Vomiting 11/26/2011   Shellfish allergy Itching, Swelling, and Rash 07/14/2017   Strawberry flavoring agent (non-screening) Nausea And Vomiting and Rash 07/14/2017   Sulfa antibiotics Nausea And Vomiting and Other (See Comments) 05/20/2015    Family History  Problem Relation Age of Onset   Cirrhosis Mother 30       ?etiology   Cirrhosis Father        ?etiology   Cirrhosis Sister 51       ?etiology   Diverticulitis Sister     Social History   Socioeconomic History   Marital status: Married    Spouse name: Not on file   Number of children: 4   Years of education: Not on file   Highest education level: Not on file  Occupational History   Occupation: disabled  Tobacco Use   Smoking status: Passive Smoke Exposure - Never Smoker   Smokeless tobacco: Never  Vaping Use   Vaping status: Never Used  Substance and Sexual Activity   Alcohol use: No   Drug use: No   Sexual activity: Not Currently  Other Topics Concern   Not on file  Social History Narrative   Lives w/ husband, daughter, son-in-law & grandkids   Social Drivers of Manufacturing engineer Strain: Not on file  Food Insecurity: No Food Insecurity (06/26/2024)   Hunger Vital Sign    Worried About Running Out of Food in the Last Year: Never true    Ran Out of Food in the Last Year: Never true  Transportation Needs: No Transportation Needs (06/26/2024)   PRAPARE - Administrator, Civil Service (Medical): No    Lack of Transportation (Non-Medical): No  Recent Concern: Transportation Needs - Unmet Transportation Needs (05/06/2024)   PRAPARE - Administrator, Civil Service (Medical): Yes    Lack of Transportation (Non-Medical): Yes  Physical Activity: Not on file  Stress: Not on file  Social Connections: Not on file  Intimate Partner Violence: Not At Risk (06/26/2024)   Humiliation, Afraid, Rape, and Kick questionnaire    Fear of Current or Ex-Partner: No    Emotionally Abused: No    Physically Abused: No    Sexually Abused: No     Review of Systems   Gen: see HPI CV: see HPI Resp: +DOE GI: see HPI GU : Denies urinary burning, blood in urine, urinary frequency, and urinary incontinence. MS: difficulty walking long distances Derm: Denies rash, itching, dry skin, hives. Psych: Denies depression, anxiety, memory loss, hallucinations, and confusion. Heme: Denies bruising or bleeding Neuro:  Denies any headaches, dizziness, paresthesias, shaking  Physical Exam   Vital Signs in last 24 hours: Temp:  [97.7 F (36.5 C)-98.4 F (36.9 C)] 98.4 F (36.9 C) (10/13 0337) Pulse Rate:  [68-80] 79 (10/13 0941) Resp:  [  15-18] 17 (10/13 0406) BP: (131-146)/(60-76) 146/70 (10/13 0941) SpO2:  [96 %-97 %] 97 % (10/13 0337) Weight:  [100.5 kg] 100.5 kg (10/13 0634) Last BM Date : 06/26/24  General:   Alert,  chronically ill-appearing, no distress Head:  Normocephalic and atraumatic. Eyes:  Sclera clear, no icterus.    Ears:  Normal auditory acuity. Lungs:  Clear throughout to auscultation.    Heart:  S1 S2 present, possible  murmur Abdomen:  Soft, obese, mild TTP only in LLQ and nondistended. No masses, hepatosplenomegaly or hernias noted.  Rectal: deferred   Msk:  Symmetrical without gross deformities. Normal posture. Extremities:  Without edema. Neurologic:  Alert and  oriented x4. Skin:  Intact without significant lesions or rashes. Psych:  Alert and cooperative. Normal mood and affect.  Intake/Output from previous day: 10/12 0701 - 10/13 0700 In: 1200 [P.O.:1200] Out: 1100 [Urine:1100] Intake/Output this shift: Total I/O In: 360 [P.O.:360] Out: -     Labs/Studies   Recent Labs Recent Labs    06/25/24 2329 06/28/24 0414  WBC 5.8 6.2  HGB 9.1* 6.8*  HCT 27.1* 20.7*  PLT 235 152   BMET Recent Labs    06/25/24 2329 06/27/24 0426 06/28/24 0246  NA 133* 136 137  K 4.8 4.9 5.3*  CL 99 101 102  CO2 22 23 21*  GLUCOSE 238* 148* 165*  BUN 31* 43* 51*  CREATININE 3.40* 5.50* 6.89*  CALCIUM  9.5 8.8* 8.7*   LFT Recent Labs    06/25/24 2329 06/28/24 0246  PROT 9.5*  --   ALBUMIN  4.1 3.3*  AST 10*  --   ALT 11  --   ALKPHOS 82  --   BILITOT 0.5  --      Radiology/Studies US  RENAL Result Date: 06/27/2024 CLINICAL DATA:  [id number] AKI (acute kidney injury) [id number]. EXAM: RENAL / URINARY TRACT ULTRASOUND COMPLETE COMPARISON:  None Available. FINDINGS: Right Kidney: Renal measurements: 4.7 x 4.8 x 10.4 cm = volume: 121.6 mL. Echogenicity within normal limits. No mass or hydronephrosis visualized. Left Kidney: Renal measurements: 4.6 x 4.6 x 11.7 cm = volume: 128.1 mL. Echogenicity within normal limits. No mass or hydronephrosis visualized. Bladder: Appears normal for degree of bladder distention. Other: None. IMPRESSION: Unremarkable renal ultrasound. Electronically Signed   By: Ree Molt M.D.   On: 06/27/2024 10:24   DG Chest Port 1 View Result Date: 06/27/2024 EXAM: 1 VIEW(S) XRAY OF THE CHEST 06/27/2024 07:58:45 AM COMPARISON: 06/26/2024 CLINICAL HISTORY: Chest pain and pressure  FINDINGS: LINES, TUBES AND DEVICES: Monitor wires noted. LUNGS AND PLEURA: No focal pulmonary opacity. No pulmonary edema. No pleural effusion. No pneumothorax. HEART AND MEDIASTINUM: No acute abnormality of the cardiac and mediastinal silhouettes. BONES AND SOFT TISSUES: Intact thoracic cage with thoracic spondylosis and mild thoracic dextroscoliosis. IMPRESSION: 1. No acute cardiopulmonary abnormality. Electronically signed by: Waddell Calk MD 06/27/2024 08:14 AM EDT RP Workstation: [id number]   ECHOCARDIOGRAM COMPLETE Result Date: 06/26/2024    ECHOCARDIOGRAM REPORT   Patient Name:   BAUDELIA SCHROEPFER Bradeen Date of Exam: 06/26/2024 Medical Rec #:  [id number]      Height:       63.0 in Accession #:    [id number]     Weight:       229.5 lb Date of Birth:  [birth year]      BSA:          2.050 m Patient Age:    63 years       BP:  123/91 mmHg Patient Gender: F              HR:           66 bpm. Exam Location:  [facility] Procedure: 2D Echo, Cardiac Doppler and Color Doppler (Both Spectral and Color            Flow Doppler were utilized during procedure). Indications:    CHF-Acute Diastolic I50.31  History:        Patient has prior history of Echocardiogram examinations, most                 recent 05/06/2024. Arrythmias:Atrial Fibrillation; Risk                 Factors:Hypertension and Diabetes.  Sonographer:    Jayson Gaskins Referring Phys: [id number] BRENDA MORRISON IMPRESSIONS  1. Left ventricular ejection fraction, by estimation, is 60 to 65%. The left ventricle has normal function. The left ventricle has no regional wall motion abnormalities. There is mild concentric left ventricular hypertrophy. Left ventricular diastolic parameters are indeterminate.  2. Right ventricular systolic function is normal. The right ventricular size is normal. There is normal pulmonary artery systolic pressure. The estimated right ventricular systolic pressure is 26.5 mmHg.  3. The mitral valve is grossly normal. Trivial mitral valve  regurgitation.  4. The aortic valve is tricuspid. There is mild calcification of the aortic valve. Aortic valve regurgitation is not visualized. Aortic valve sclerosis is present, with no evidence of aortic valve stenosis. Aortic valve mean gradient measures 5.0 mmHg.  5. The inferior vena cava is normal in size with <50% respiratory variability, suggesting right atrial pressure of 8 mmHg. Comparison(s): Prior images reviewed side by side. LVEF 60-65%. Normal estimated RVSP. FINDINGS  Left Ventricle: Left ventricular ejection fraction, by estimation, is 60 to 65%. The left ventricle has normal function. The left ventricle has no regional wall motion abnormalities. The left ventricular internal cavity size was normal in size. There is  mild concentric left ventricular hypertrophy. Left ventricular diastolic parameters are indeterminate. Right Ventricle: The right ventricular size is normal. No increase in right ventricular wall thickness. Right ventricular systolic function is normal. There is normal pulmonary artery systolic pressure. The tricuspid regurgitant velocity is 2.15 m/s, and  with an assumed right atrial pressure of 8 mmHg, the estimated right ventricular systolic pressure is 26.5 mmHg. Left Atrium: Left atrial size was normal in size. Right Atrium: Right atrial size was normal in size. Pericardium: There is no evidence of pericardial effusion. Presence of epicardial fat layer. Mitral Valve: The mitral valve is grossly normal. Trivial mitral valve regurgitation. Tricuspid Valve: The tricuspid valve is grossly normal. Tricuspid valve regurgitation is trivial. Aortic Valve: The aortic valve is tricuspid. There is mild calcification of the aortic valve. Aortic valve regurgitation is not visualized. Aortic valve sclerosis is present, with no evidence of aortic valve stenosis. Aortic valve mean gradient measures 5.0 mmHg. Aortic valve peak gradient measures 7.8 mmHg. Aortic valve area, by VTI measures 2.09  cm. Pulmonic Valve: The pulmonic valve was not well visualized. Pulmonic valve regurgitation is trivial. Aorta: The aortic root is normal in size and structure. Venous: The inferior vena cava was not well visualized. The inferior vena cava is normal in size with less than 50% respiratory variability, suggesting right atrial pressure of 8 mmHg. IAS/Shunts: No atrial level shunt detected by color flow Doppler. Additional Comments: 3D was performed not requiring image post processing on an independent workstation and was indeterminate.  LEFT VENTRICLE PLAX 2D LVIDd:         4.60 cm   Diastology LVIDs:         3.00 cm   LV e' medial:    4.79 cm/s LV PW:         1.10 cm   LV E/e' medial:  18.4 LV IVS:        1.10 cm   LV e' lateral:   6.74 cm/s LVOT diam:     2.00 cm   LV E/e' lateral: 13.1 LV SV:         77 LV SV Index:   38 LVOT Area:     3.14 cm  RIGHT VENTRICLE RV S prime:     13.80 cm/s TAPSE (M-mode): 2.1 cm LEFT ATRIUM           Index        RIGHT ATRIUM           Index LA Vol (A2C): 66.4 ml 32.39 ml/m  RA Area:     14.90 cm LA Vol (A4C): 37.9 ml 18.49 ml/m  RA Volume:   38.70 ml  18.88 ml/m  AORTIC VALVE AV Area (Vmax):    2.18 cm AV Area (Vmean):   2.15 cm AV Area (VTI):     2.09 cm AV Vmax:           140.00 cm/s AV Vmean:          112.000 cm/s AV VTI:            0.370 m AV Peak Grad:      7.8 mmHg AV Mean Grad:      5.0 mmHg LVOT Vmax:         97.20 cm/s LVOT Vmean:        76.500 cm/s LVOT VTI:          0.246 m LVOT/AV VTI ratio: 0.66  AORTA Ao Root diam: 2.90 cm MITRAL VALVE               TRICUSPID VALVE MV Area (PHT): 2.39 cm    TR Peak grad:   18.5 mmHg MV Decel Time: 318 msec    TR Vmax:        215.00 cm/s MV E velocity: 88.10 cm/s MV A velocity: 84.30 cm/s  SHUNTS MV E/A ratio:  1.05        Systemic VTI:  0.25 m                            Systemic Diam: 2.00 cm Jayson Sierras MD Electronically signed by Jayson Sierras MD Signature Date/Time: 06/26/2024/3:25:41 PM    Final      Assessment    Wilhemenia Wrenley Sayed is a 63 y.o. year old female with past medicatl history of CAD, NSTEMI s/p DES 2022, DVT, DM, HLD, HTN, GERD, CKD with likely need for dialysis in future, gastroparesis, asthma, bipolar, afib on Eliquis , anemia with chronic disease and IDA, multiple blood transfusions and iron infusions over course of this year, presenting this admission with chest pain and LLQ abdominal pain. GI has been consulted due to acute on chronic anemia with concern for occult GI bleed. She also reports chronic, intermittent LLQ abdominal pain, need for multiple IV iron infusions and blood transfusions over this past year, and strong family history of cirrhosis.   Acute on chronic anemia with history of transfusion dependent anemia: Hgb baseline 7/8 range, with admitting  9.1 and now 6.8 today but without overt GI bleeding, obvious melena, hematochezia, etc. Hemoccult status unknown. Regardless, has had evidence for notable IDA, transfusion dependent anemia, prior EGD in March/April 2025 at outside hospital with reported gastric polyps as possible source for occult blood loss. Unable to exclude small bowel AVMs as source for stuttering blood loss in setting of antiplatelet and anticoagulation chronically. Also unable to r/o occult colonic lesion. Could have ongoing blood loss from anywhere in the GI tract. Eliquis  last received yesterday evening. Recommend optimization from cardiac standpoint first, and could repeat EGD while inpatient, possible colonoscopy if patient is optimized. She prefers to wait as outpatient for colonoscopy. Would benefit from establishing care with Hematology as well for serial iron infusions and close monitoring.   LLQ abdominal pain: without obvious signs of diverticulitis on non-contrast CT and does note improvement after bowel movement but denies any constipation and feels stools are productive. She does have 2-3 bowel movements per day but denies diarrhea. Could consider dicyclomine but  would want to avoid constipation in this scenario. Recommend outpatient colonoscopy.   Prior enteritis: on [facility] at [facility] on 10/7. Not appreciated on non-contrast CT during this admission. With IDA and constellation of symptoms, also with reporting sister with possible IBD, recommend ileocolonoscopy as outpatient and further dedicated imaging may be needed in light of IDA to include capsule (recommend Agile first).   Strong family history of cirrhosis: hepatic steatosis noted on prior imaging and splenomegaly but no thrombocytopenia. LFTs normal. Unclear etiology for family member's liver disease but does not appear alcohol was a factor. As outpatient recommend more intensive evaluation. Does not appear to have advanced disease currently and no signs of portal hypertension.    Plan / Recommendations    Agree with holding Eliquis . In light of acute on chronic kidney disease, may need longer washout period (48-72 hours). Last dose yesterday evening so earliest diagnostic endoscopy would be tomorrow or possibly Wednesday Agree with cardiology consultation: await recommendations. Patient needs to be optimized from cardiac and renal standpoint prior to any procedures Agree with transfusion. Iron levels pending. Would also benefit from IV iron while inpatient and Hematology referral as outpatient Recommend at least EGD this admission in order to re-evaluate as felt to have bleeding from gastric polyps earlier this year (March/April). Could pursue ileocolonoscopy as outpatient, as patient is favoring colonoscopy outpatient. Additionally, small bowel imaging can be as outpatient as well. Unable to exclude AVMs  Check celiac serologies as well, and with ?enteritis on prior imaging and possible FH IBD, will check fecal cal, CRP, sed rate. Will need further evaluation as outpatient  Appreciate Nephrology and Cardiology following Timing of endoscopic procedures to be determined     06/28/2024, 11:24  AM  Therisa MICAEL Stager, PhD, ANP-BC [facility]

## 2024-06-28 NOTE — Progress Notes (Signed)
 Lab called that patient's type and screen is positive for antibodies and sample will have to be sent to [facility] for further screening. They will call the unit when blood is ready to be picked up. If needed prior to these results coming back, it would be an emergency release and least incompatible unit.

## 2024-06-28 NOTE — Consult Note (Addendum)
 [name] Admit Date: 06/25/2024 06/28/2024 [name] Requesting Physician:  JAYSON Louder MD  Reason for Consult:  AKI on CKD5 HPI:  76F PMH including DM2 with neuropathy, hypertension, CAD with history of PCI, chronic HFpEF, bipolar disorder, atrial fibrillation, history of GI bleeding, chronic recurrent chest pain who presented to [facility] over the weekend with recurrent chest pain.  Patient has been followed at [facility] by [facility].  More recently she was seen by outpatient nephrology in [place] on 05/31/2024, those external records have been reviewed.  Notably at that time her creatinine was 3.49 with a GFR of 14.  Here her chest pain workup included troponin trends which are stable.  Cardiology has been consulted.  At presentation her creatinine is 3.4 which appeared at baseline.  Over the past 3 days it is increased to 5.5 and 6.9 today.  Potassium is 5.3 and Lokelma has been prescribed.  Serum bicarbonate is 21 with an anion gap of 14.  She has a chronic anemia and hemoglobin has dropped to 6.8 from a presenting value of 9.1.  MCV is 93.  Patient denies use of NSAIDs prior to admission.  No nephrotoxic exposures including IV contrast since arrival.  She denies significant nausea/vomiting/diarrhea prior to admission.  She is eating normally here.  Urine analysis on 10/11 with 6-10 erythrocytes per high-powered field, 2+ protein present.  Foley catheter has been placed.  Renal ultrasound on 06/27/2024 and kidneys were 10.4 cm on the right and 11.7 cm on the left without evidence of mass or hydronephrosis.    No significant edema, dyspnea, at this time.  Blood pressures are fairly stable.  She still feels well having intermittent chest pain.   Creatinine, Ser (mg/dL)  Date Value  89/86/7974 6.89 (H)  06/27/2024 5.50 (H)  06/25/2024 3.40 (H)  05/28/2024 3.68 (H)  05/18/2024 3.90 (H)  05/08/2024 5.31 (H)  05/07/2024 5.35 (H)  05/06/2024 5.33 (H)   05/05/2024 5.57 (H)  06/08/2023 9.74 (H)  ] I/Os: I/O last 3 completed shifts: In: 1440 [P.O.:1440] Out: 1300 [Urine:1300]   ROS NSAIDS: Denies exposure IV Contrast no exposure TMP/SMX no exposure Hypotension not present Balance of 12 systems is negative w/ exceptions as above  PMH  Past Medical History:  Diagnosis Date   Anxiety    Asthma    Atrial fibrillation (HCC)    Bipolar 1 disorder (HCC)    Chronic back pain    Chronic chest pain    Diabetes mellitus without complication (HCC)    DVT (deep venous thrombosis) (HCC) 2017   Gastroparesis    GERD (gastroesophageal reflux disease)    GI bleed 11/2023   required 2 units PRBC transfusion   Hyperlipemia    Hypertension    IBS (irritable bowel syndrome)    Normal cardiac stress test 02/14/2014   [facility]   Panic attacks    PSH  Past Surgical History:  Procedure Laterality Date   ABDOMINAL HYSTERECTOMY  2001   APPENDECTOMY  10/2005   CHOLECYSTECTOMY  10/2005   COLONOSCOPY  2007   Patel ([name])-pt reports hemorrhoids   ESOPHAGOGASTRODUODENOSCOPY  02/28/2006   [name], normal on daily PPI, myultiple hyperplastic polyps   MOUTH SURGERY     RIGHT HEART CATH N/A 04/25/2023   Procedure: RIGHT HEART CATH;  Surgeon: Verlin Lonni BIRCH, MD;  Location: [facility] INVASIVE CV LAB;  Service: Cardiovascular;  Laterality: N/A;   RIGHT OOPHORECTOMY  1999   FH  Family History  Problem  Relation Age of Onset   Cirrhosis Mother 63       ?etiology   Cirrhosis Father        ?etiology   Cirrhosis Sister 43       ?etiology   Diverticulitis Sister    SH  reports that she is a non-smoker but has been exposed to tobacco smoke. She has never used smokeless tobacco. She reports that she does not drink alcohol and does not use drugs. Allergies  Allergies  Allergen Reactions   Gelatin Swelling    No jello of any kind   Iodinated Contrast Media Anaphylaxis, Hives, Itching and Swelling   Methylprednisolone  Sodium Succ  Shortness Of Breath   Dicyclomine Hcl Anxiety and Other (See Comments)    shaky and sick GI upset, sick to stomach   Doxycycline Hives and Rash    rash   Glutamic Acid Itching and Swelling   Ketorolac Hives and Rash    Tolerated Toradol in the ED without side effect, hives, or complaint on 02/17/2022   Lisinopril  Swelling    Angioedema (facial, lip, or tongue swelling)   Metformin Nausea And Vomiting and Other (See Comments)    Dyspepsia sick to stomach It made me sick where I couldn't get out of the bed   Nitroglycerin  Nausea And Vomiting and Swelling    Pt states it makes her too sick   Nsaids Other (See Comments)    Stomach pain/bleeding   Other Itching, Swelling and Other (See Comments)    Solu-Medrol  Mix-O-Vial   Quetiapine Other (See Comments)   Vancomycin     Itching and erythema at infusion site within a few minutes of starting infusion. No systemic evidence of Red Man Syndrome   Blueberry Flavoring Agent (Non-Screening) Nausea And Vomiting and Rash   Cephalexin  Nausea And Vomiting and Nausea Only    sick to stomach   Fish Allergy Rash   Ibuprofen Nausea And Vomiting    GI upset Reports stomach upset 2/2 hx gastric polyps. Not true allergy.   Nitrofuran Derivatives Itching and Nausea And Vomiting    sick to stomach   Shellfish Allergy Itching, Swelling and Rash   Strawberry Flavoring Agent (Non-Screening) Nausea And Vomiting and Rash   Sulfa Antibiotics Nausea And Vomiting and Other (See Comments)    It just makes me real sick   Home medications Prior to Admission medications   Medication Sig Start Date End Date Taking? Authorizing Provider  albuterol  (VENTOLIN  HFA) 108 (90 Base) MCG/ACT inhaler Inhale 2 puffs into the lungs every 6 (six) hours as needed for wheezing or shortness of breath.   Yes [provider]  apixaban  (ELIQUIS ) 5 MG TABS tablet Take 1 tablet (5 mg total) by mouth 2 (two) times daily. 11/17/17  Yes Zammit, Joseph, MD  atorvastatin   (LIPITOR) 40 MG tablet Take 40 mg by mouth every morning.   Yes [provider]  carvedilol  (COREG ) 25 MG tablet Take 0.5 tablets (12.5 mg total) by mouth 2 (two) times daily with a meal. Patient taking differently: Take 12.5 mg by mouth daily with breakfast. Patient takes whole pill 25 mg BID 05/03/23  Yes Patel, Rajiv C, MD  clonazePAM  (KLONOPIN ) 0.5 MG tablet Take 1 tablet (0.5 mg total) by mouth 2 (two) times daily as needed for anxiety. 05/18/24  Yes Pollina, Lonni PARAS, MD  clopidogrel  (PLAVIX ) 75 MG tablet Take 75 mg by mouth daily.   Yes [provider]  ergocalciferol (VITAMIN D2) 1.25 MG (50000 UT) capsule  Take 50,000 Units by mouth every 30 (thirty) days. 06/01/24 06/01/25 Yes [provider]  famotidine  (PEPCID ) 20 MG tablet Take 20 mg by mouth 2 (two) times daily.   Yes [provider]  gabapentin  (NEURONTIN ) 300 MG capsule Take 300 mg by mouth 3 (three) times daily. Patient taking differently: Take 300 mg by mouth 3 (three) times daily. Reduced to BID by MD   Yes [provider]  hydrOXYzine  (ATARAX ) 25 MG tablet Take 25 mg by mouth every 12 (twelve) hours as needed for anxiety.   Yes [provider]  isosorbide  mononitrate (IMDUR ) 60 MG 24 hr tablet Take 1 tablet (60 mg total) by mouth every morning. 05/09/24  Yes Tat, Alm, MD  lamoTRIgine  (LAMICTAL ) 100 MG tablet Take 100 mg by mouth 2 (two) times daily.   Yes [provider]  LANTUS  SOLOSTAR 100 UNIT/ML Solostar Pen Inject 50 Units into the skin 2 (two) times daily.   Yes [provider]  NOVOLOG  FLEXPEN 100 UNIT/ML FlexPen Inject 15 Units into the skin 4 (four) times daily. 4-5 times daily 05/13/15  Yes [provider]  ondansetron  (ZOFRAN ) 4 MG tablet Take 1 tablet (4 mg total) by mouth every 6 (six) hours as needed for nausea or vomiting. 05/28/24  Yes Jerral Meth, MD  ondansetron  (ZOFRAN -ODT) 4 MG disintegrating tablet Take 2 mg by mouth every 6  (six) hours as needed. 06/20/24  Yes [provider]  pantoprazole  (PROTONIX ) 40 MG tablet Take 40 mg by mouth 2 (two) times daily before a meal.   Yes [provider]  promethazine  (PHENERGAN ) 25 MG tablet Take 25 mg by mouth every 6 (six) hours as needed for nausea or vomiting.   Yes [provider]  sertraline  (ZOLOFT ) 100 MG tablet Take 100 mg by mouth daily.   Yes [provider]  SYMBICORT 160-4.5 MCG/ACT inhaler Inhale 2 puffs into the lungs daily at 6 (six) AM. Patient not taking: Reported on 06/26/2024 02/03/24   [provider]    Current Medications Scheduled Meds:  sodium chloride    Intravenous Once   atorvastatin   40 mg Oral q morning   carvedilol   6.25 mg Oral BID WC   Chlorhexidine Gluconate Cloth  6 each Topical Daily   clopidogrel   75 mg Oral Daily   famotidine   20 mg Oral Daily   insulin  aspart  0-9 Units Subcutaneous TID AC & HS   isosorbide  mononitrate  60 mg Oral q morning   lamoTRIgine   100 mg Oral BID   pantoprazole   40 mg Oral BID AC   sertraline   100 mg Oral Daily   sodium zirconium cyclosilicate  10 g Oral TID   Continuous Infusions: PRN Meds:.clonazePAM , fentaNYL  (SUBLIMAZE ) injection  CBC Recent Labs  Lab 06/25/24 2329 06/28/24 0414  WBC 5.8 6.2  NEUTROABS 3.9  --   HGB 9.1* 6.8*  HCT 27.1* 20.7*  MCV 92.2 93.7  PLT 235 152   Basic Metabolic Panel Recent Labs  Lab 06/25/24 2329 06/27/24 0426 06/28/24 0246  NA 133* 136 137  K 4.8 4.9 5.3*  CL 99 101 102  CO2 22 23 21*  GLUCOSE 238* 148* 165*  BUN 31* 43* 51*  CREATININE 3.40* 5.50* 6.89*  CALCIUM  9.5 8.8* 8.7*  PHOS  --   --  5.7*    Physical Exam  Blood pressure (!) 140/60, pulse 80, temperature 98.4 F (36.9 C), temperature source Oral, resp. rate 17, height 5' 4 (1.626 m), weight 100.5 kg,  SpO2 97%. GEN: Chronically ill, obese, NAD, engaging and conversant ENT: NCAT EYES: EOMI CV: Regular, normal S1 and S2, no rub PULM: Clear  bilaterally throughout, normal work of breathing ABD: Limited by adiposity, soft, nontender SKIN: No rashes or lesions EXT: No peripheral edema  Assessment 86F AKI on CKD5, AoC Anemia, recurrent chest pain with history of ASCVD/CAD.  AKI on CKD5, worsening, high risk of progressing to dialysis dependence.  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  Imaging reassuring.  Specific etiology unclear.  No obvious nephrotoxin exposure or identified threat to volume status.  Perhaps related to #2, worsening of anemia.  No uremic symptoms but potentially could develop within the next few days. Acute on chronic anemia: Now hemoglobin down to 6.8 with normal WBC and platelet counts, normocytic.  History of GI bleeds, on apixaban  as outpatient.  Transfusing per primary service.  Check iron levels, start Aranesp . Recurrent chest pain with significant history of CAD and PCI: Troponins reassuring.  Cardiology to see. Mild hyperkalemia:Lokelma has been prescribed.  Agree. Longstanding DM2 with microvascular disease History of GI bleed and A-fib on apixaban  at presentation  Plan She is eating well, will continue with oral rehydration given her extensive cardiac history and I do not really think that she is hypovolemic. Continue supportive care otherwise.  Home medication list, current medication list and medication history reviewed. Will follow closely Daily weights, Daily Renal Panel, Strict I/Os, Avoid nephrotoxins (NSAIDs, judicious IV Contrast)  Case and plan discussed with Dr. Vicci.   [name]  06/28/2024, 9:16 AM

## 2024-06-28 NOTE — Progress Notes (Addendum)
 Progress Note  RN called due to patient's hemoglobin dropping to 6.8 from 9.1 (10/10).  No obvious source of bleeding noted.  H/H was repeated and still returned at 6.8.  This is possibly due to ABLA/GI bleed.  Type and screen was done, 1 unit of blood was ordered for transfusion.  Consent was obtained from patient and 1 unit of blood will be transfused at this time. Patient was in no acute distress. Gastroenterologist will be consulted and and we shall await further recommendations   Total time: 32 minutes This includes time reviewing the chart including progress notes, labs, EKGs, taking medical decisions, ordering labs and documenting findings.  Please refer to admission H&P and progress notes regarding details of care for this patient   Type and screen was positive for antibodies per Lab. They will have to send it to [place] and it will take a while to get a matched unit.

## 2024-06-28 NOTE — H&P (View-Only) (Signed)
 Gastroenterology Consult   Referring Provider: Dr. Vicci Primary Care Physician:  System, Provider Not In Primary Gastroenterologist:  unassigned  Patient ID: Monnica Saltsman; [id number]; 11-24-1960   Admit date: 06/25/2024  LOS: 2 days   Date of Consultation: 06/28/2024  Reason for Consultation:  GI bleed  History of Present Illness   Sneha Shizue Kaseman is a 63 y.o. year old female with past medical history of CAD, NSTEMI s/p DES 2022, DVT, DM, HLD, HTN, GERD, CKD with likely need for dialysis in future, gastroparesis, asthma, bipolar, afib on Eliquis , anemia with chronic disease and IDA, multiple blood transfusions and iron infusions over course of this year, presenting this admission with chest pain and LLQ abdominal pain. GI has been consulted as Hgb has dropped down to 6.8 today from 9.1 on presentation 10/10.   Hospital course: On admission Hgb 9.1, down to 6.8 when rechecked this morning on 10/13. Appears baseline 7/8 range. 1 unit PRBCs has been ordered but due to screen positive for antibodies, this sample has been sent to [facility] for further screening.   No thrombocytopenia. Creatinine 3.4 on admission and trending up, now 6.89, LFTs normal and have been normal historically, BNP over 1000, troponin elevated in 30s, low 41. US  with trace leukocytes, negative nitrites, rare bacteria,   CT 10/11 without contrast: nonobstructing renal calculi, otherwise no acute findings.   Nephrology and Cardiology have been consulted and following.   Eliquis  on hold as of this morning, 10.13, and last dose 10/12 in the evening. Remains on Plavix .   Today at consultation: She reports presenting to the ED with recurrent chest pain and LLQ abdominal pain. No overt GI bleeding. Stool is brown. Prior blood transfusions this year, EGD per notes in March/April in [place] with gastric polyps and felt this was source for anemia. Black stool in the spring. Was having IV iron infusions while in [place] . Moved  here in 2022 after stents placed. Last colonoscopy over 10 years ago. No capsule study she is aware.   No melena or hematochezia. LLQ abdominal pain is episodic, noted recurrent starting a few days ago. She is unable to identify any provoking factors. Feels at its worst a 6/10. Feels mild improvement after BM but then returns.  States she has this pain off and on for a few days during episodes. No constipation. No diarrhea. BM 2-3 times per day. Soft. No straining.  Feels like she is emptying out well. No pain after eating. No NSAIDs.   For the past year has had Intermittent nausea. Takes zofran . Hx of gastroparesis. Poor appetite. Believes she has lost weight but unable to report how much. No dysphagia. On pantoprazole  as outpatient BID.   Ferritin as low as 19 a year ago. States she hasn't had A1c checked in awhile. Sees PCP at [facility]. Difficulty controlling diabetes.   Notably, strong family history of cirrhosis in siblings and mother, not associated with alcohol. Prior imaging reviewed and although spleen reported as normal on non-contrast CT this admission, she has had evidence for hepatic steatosis and HSM on multiple prior CTs . No thrombocytopenia.    CT 06/22/24 at [facility]: suspected enteritis of proximal mid small bowel, likely infectious. Location mid jejunum to proximal mid ileum. Normal liver.    Colonoscopy at [facility] 2014: complete to cecum, hemorrhoids, otherwise normal EGD at [facility] 2014: normal esophagus, sessile 5-6 mm polypoid lesions in antrum s/p biopsy, normal duodenum s/p biopsy,    Mother: colon polyps, UGI bleeding, Cirrhosis  Brother and sister: both had cirrhosis. Did not drink.  Brother: deceased from liver cancer, has hx of cirrhosis.  Sister: problems with bowels, ?IBD   Past Medical History:  Diagnosis Date   Anxiety    Asthma    Atrial fibrillation (HCC)    Bipolar 1 disorder (HCC)    Chronic back pain    Chronic chest pain    Diabetes mellitus without  complication (HCC)    DVT (deep venous thrombosis) (HCC) 2017   Gastroparesis    GERD (gastroesophageal reflux disease)    GI bleed 11/2023   required 2 units PRBC transfusion   Hyperlipemia    Hypertension    IBS (irritable bowel syndrome)    Normal cardiac stress test 02/14/2014   [facility]   Panic attacks     Past Surgical History:  Procedure Laterality Date   ABDOMINAL HYSTERECTOMY  2001   APPENDECTOMY  10/2005   CHOLECYSTECTOMY  10/2005   COLONOSCOPY  2007   [facility] ([name])-pt reports hemorrhoids   ESOPHAGOGASTRODUODENOSCOPY  02/28/2006   [name], normal on daily PPI, myultiple hyperplastic polyps   MOUTH SURGERY     RIGHT HEART CATH N/A 04/25/2023   Procedure: RIGHT HEART CATH;  Surgeon: Verlin Lonni BIRCH, MD;  Location: [facility] INVASIVE CV LAB;  Service: Cardiovascular;  Laterality: N/A;   RIGHT OOPHORECTOMY  1999    Prior to Admission medications   Medication Sig Start Date End Date Taking? Authorizing Provider  albuterol  (VENTOLIN  HFA) 108 (90 Base) MCG/ACT inhaler Inhale 2 puffs into the lungs every 6 (six) hours as needed for wheezing or shortness of breath.   Yes [provider]  apixaban  (ELIQUIS ) 5 MG TABS tablet Take 1 tablet (5 mg total) by mouth 2 (two) times daily. 11/17/17  Yes Suzette Pac, MD  atorvastatin  (LIPITOR) 40 MG tablet Take 40 mg by mouth every morning.   Yes [provider]  carvedilol  (COREG ) 25 MG tablet Take 0.5 tablets (12.5 mg total) by mouth 2 (two) times daily with a meal. Patient taking differently: Take 12.5 mg by mouth daily with breakfast. Patient takes whole pill 25 mg BID 05/03/23  Yes Tobie Paticia BROCKS, MD  clonazePAM  (KLONOPIN ) 0.5 MG tablet Take 1 tablet (0.5 mg total) by mouth 2 (two) times daily as needed for anxiety. 05/18/24  Yes Pollina, Lonni PARAS, MD  clopidogrel  (PLAVIX ) 75 MG tablet Take 75 mg by mouth daily.   Yes [provider]  ergocalciferol (VITAMIN D2) 1.25 MG (50000 UT) capsule Take  50,000 Units by mouth every 30 (thirty) days. 06/01/24 06/01/25 Yes [provider]  famotidine  (PEPCID ) 20 MG tablet Take 20 mg by mouth 2 (two) times daily.   Yes [provider]  gabapentin  (NEURONTIN ) 300 MG capsule Take 300 mg by mouth 3 (three) times daily. Patient taking differently: Take 300 mg by mouth 3 (three) times daily. Reduced to BID by MD   Yes [provider]  hydrOXYzine  (ATARAX ) 25 MG tablet Take 25 mg by mouth every 12 (twelve) hours as needed for anxiety.   Yes [provider]  isosorbide  mononitrate (IMDUR ) 60 MG 24 hr tablet Take 1 tablet (60 mg total) by mouth every morning. 05/09/24  Yes Tat, Alm, MD  lamoTRIgine  (LAMICTAL ) 100 MG tablet Take 100 mg by mouth 2 (two) times daily.   Yes [provider]  LANTUS  SOLOSTAR 100 UNIT/ML Solostar Pen Inject 50 Units into the skin 2 (two) times daily.   Yes [provider]  NOVOLOG   FLEXPEN 100 UNIT/ML FlexPen Inject 15 Units into the skin 4 (four) times daily. 4-5 times daily 05/13/15  Yes [provider]  ondansetron  (ZOFRAN ) 4 MG tablet Take 1 tablet (4 mg total) by mouth every 6 (six) hours as needed for nausea or vomiting. 05/28/24  Yes Jerral Meth, MD  ondansetron  (ZOFRAN -ODT) 4 MG disintegrating tablet Take 2 mg by mouth every 6 (six) hours as needed. 06/20/24  Yes [provider]  pantoprazole  (PROTONIX ) 40 MG tablet Take 40 mg by mouth 2 (two) times daily before a meal.   Yes [provider]  promethazine  (PHENERGAN ) 25 MG tablet Take 25 mg by mouth every 6 (six) hours as needed for nausea or vomiting.   Yes [provider]  sertraline  (ZOLOFT ) 100 MG tablet Take 100 mg by mouth daily.   Yes [provider]  SYMBICORT 160-4.5 MCG/ACT inhaler Inhale 2 puffs into the lungs daily at 6 (six) AM. Patient not taking: Reported on 06/26/2024 02/03/24   [provider]    Current Facility-Administered Medications  Medication  Dose Route Frequency Provider Last Rate Last Admin   0.9 %  sodium chloride  infusion (Manually program via Guardrails IV Fluids)   Intravenous Once Adefeso, Oladapo, DO   Held at 06/28/24 0740   atorvastatin  (LIPITOR) tablet 40 mg  40 mg Oral q morning Johnson, Clanford L, MD   40 mg at 06/28/24 0940   carvedilol  (COREG ) tablet 6.25 mg  6.25 mg Oral BID WC Johnson, Clanford L, MD   6.25 mg at 06/28/24 0940   Chlorhexidine Gluconate Cloth 2 % PADS 6 each  6 each Topical Daily Johnson, Clanford L, MD   6 each at 06/28/24 9057   clonazePAM  (KLONOPIN ) tablet 0.5 mg  0.5 mg Oral BID PRN Jesus America, NP       clopidogrel  (PLAVIX ) tablet 75 mg  75 mg Oral Daily Jesus America, NP   75 mg at 06/27/24 9157   Darbepoetin Alfa  (ARANESP ) injection 100 mcg  100 mcg Subcutaneous Q Mon-1800 Marlee Motto B, MD       famotidine  (PEPCID ) tablet 20 mg  20 mg Oral Daily Jesus America, NP   20 mg at 06/28/24 0941   fentaNYL  (SUBLIMAZE ) injection 25 mcg  25 mcg Intravenous Q2H PRN Vicci, Clanford L, MD   25 mcg at 06/28/24 0941   insulin  aspart (novoLOG ) injection 0-9 Units  0-9 Units Subcutaneous TID AC & HS Jesus America, NP   2 Units at 06/28/24 9060   isosorbide  mononitrate (IMDUR ) 24 hr tablet 60 mg  60 mg Oral q morning Jesus America, NP   60 mg at 06/28/24 0940   lamoTRIgine  (LAMICTAL ) tablet 100 mg  100 mg Oral BID Jesus America, NP   100 mg at 06/28/24 0941   pantoprazole  (PROTONIX ) EC tablet 40 mg  40 mg Oral BID AC Morrison, Brenda, NP   40 mg at 06/28/24 0941   sertraline  (ZOLOFT ) tablet 100 mg  100 mg Oral Daily Jesus America, NP   100 mg at 06/28/24 0940   sodium zirconium cyclosilicate (LOKELMA) packet 10 g  10 g Oral TID Vicci Pen L, MD   10 g at 06/28/24 0940    Allergies as of 06/25/2024 - Review Complete 06/25/2024  Allergen Reaction Noted   Gelatin Swelling 09/10/2014   Iodinated contrast media Anaphylaxis, Hives, Itching, and Swelling 10/28/2014    Methylprednisolone  sodium succ Shortness Of Breath 05/21/2016   Dicyclomine hcl Anxiety and Other (See Comments) 10/14/2014  Doxycycline Hives and Rash 05/21/2016   Glutamic acid Itching and Swelling 07/14/2017   Ketorolac Hives and Rash 05/21/2016   Lisinopril  Swelling 03/31/2017   Metformin Nausea And Vomiting and Other (See Comments) 10/14/2014   Nitroglycerin  Nausea And Vomiting and Swelling 09/10/2014   Nsaids Other (See Comments) 07/14/2017   Other Itching, Swelling, and Other (See Comments) 05/21/2016   Quetiapine Other (See Comments) 05/28/2023   Vancomycin  11/13/2020   Blueberry flavoring agent (non-screening) Nausea And Vomiting and Rash 07/14/2017   Cephalexin  Nausea And Vomiting and Nausea Only 07/14/2017   Fish allergy Rash 11/05/2020   Ibuprofen Nausea And Vomiting 10/21/2006   Nitrofuran derivatives Itching and Nausea And Vomiting 11/26/2011   Shellfish allergy Itching, Swelling, and Rash 07/14/2017   Strawberry flavoring agent (non-screening) Nausea And Vomiting and Rash 07/14/2017   Sulfa antibiotics Nausea And Vomiting and Other (See Comments) 05/20/2015    Family History  Problem Relation Age of Onset   Cirrhosis Mother 30       ?etiology   Cirrhosis Father        ?etiology   Cirrhosis Sister 51       ?etiology   Diverticulitis Sister     Social History   Socioeconomic History   Marital status: Married    Spouse name: Not on file   Number of children: 4   Years of education: Not on file   Highest education level: Not on file  Occupational History   Occupation: disabled  Tobacco Use   Smoking status: Passive Smoke Exposure - Never Smoker   Smokeless tobacco: Never  Vaping Use   Vaping status: Never Used  Substance and Sexual Activity   Alcohol use: No   Drug use: No   Sexual activity: Not Currently  Other Topics Concern   Not on file  Social History Narrative   Lives w/ husband, daughter, son-in-law & grandkids   Social Drivers of Manufacturing engineer Strain: Not on file  Food Insecurity: No Food Insecurity (06/26/2024)   Hunger Vital Sign    Worried About Running Out of Food in the Last Year: Never true    Ran Out of Food in the Last Year: Never true  Transportation Needs: No Transportation Needs (06/26/2024)   PRAPARE - Administrator, Civil Service (Medical): No    Lack of Transportation (Non-Medical): No  Recent Concern: Transportation Needs - Unmet Transportation Needs (05/06/2024)   PRAPARE - Administrator, Civil Service (Medical): Yes    Lack of Transportation (Non-Medical): Yes  Physical Activity: Not on file  Stress: Not on file  Social Connections: Not on file  Intimate Partner Violence: Not At Risk (06/26/2024)   Humiliation, Afraid, Rape, and Kick questionnaire    Fear of Current or Ex-Partner: No    Emotionally Abused: No    Physically Abused: No    Sexually Abused: No     Review of Systems   Gen: see HPI CV: see HPI Resp: +DOE GI: see HPI GU : Denies urinary burning, blood in urine, urinary frequency, and urinary incontinence. MS: difficulty walking long distances Derm: Denies rash, itching, dry skin, hives. Psych: Denies depression, anxiety, memory loss, hallucinations, and confusion. Heme: Denies bruising or bleeding Neuro:  Denies any headaches, dizziness, paresthesias, shaking  Physical Exam   Vital Signs in last 24 hours: Temp:  [97.7 F (36.5 C)-98.4 F (36.9 C)] 98.4 F (36.9 C) (10/13 0337) Pulse Rate:  [68-80] 79 (10/13 0941) Resp:  [  15-18] 17 (10/13 0406) BP: (131-146)/(60-76) 146/70 (10/13 0941) SpO2:  [96 %-97 %] 97 % (10/13 0337) Weight:  [100.5 kg] 100.5 kg (10/13 0634) Last BM Date : 06/26/24  General:   Alert,  chronically ill-appearing, no distress Head:  Normocephalic and atraumatic. Eyes:  Sclera clear, no icterus.    Ears:  Normal auditory acuity. Lungs:  Clear throughout to auscultation.    Heart:  S1 S2 present, possible  murmur Abdomen:  Soft, obese, mild TTP only in LLQ and nondistended. No masses, hepatosplenomegaly or hernias noted.  Rectal: deferred   Msk:  Symmetrical without gross deformities. Normal posture. Extremities:  Without edema. Neurologic:  Alert and  oriented x4. Skin:  Intact without significant lesions or rashes. Psych:  Alert and cooperative. Normal mood and affect.  Intake/Output from previous day: 10/12 0701 - 10/13 0700 In: 1200 [P.O.:1200] Out: 1100 [Urine:1100] Intake/Output this shift: Total I/O In: 360 [P.O.:360] Out: -     Labs/Studies   Recent Labs Recent Labs    06/25/24 2329 06/28/24 0414  WBC 5.8 6.2  HGB 9.1* 6.8*  HCT 27.1* 20.7*  PLT 235 152   BMET Recent Labs    06/25/24 2329 06/27/24 0426 06/28/24 0246  NA 133* 136 137  K 4.8 4.9 5.3*  CL 99 101 102  CO2 22 23 21*  GLUCOSE 238* 148* 165*  BUN 31* 43* 51*  CREATININE 3.40* 5.50* 6.89*  CALCIUM  9.5 8.8* 8.7*   LFT Recent Labs    06/25/24 2329 06/28/24 0246  PROT 9.5*  --   ALBUMIN  4.1 3.3*  AST 10*  --   ALT 11  --   ALKPHOS 82  --   BILITOT 0.5  --      Radiology/Studies US  RENAL Result Date: 06/27/2024 CLINICAL DATA:  [id number] AKI (acute kidney injury) [id number]. EXAM: RENAL / URINARY TRACT ULTRASOUND COMPLETE COMPARISON:  None Available. FINDINGS: Right Kidney: Renal measurements: 4.7 x 4.8 x 10.4 cm = volume: 121.6 mL. Echogenicity within normal limits. No mass or hydronephrosis visualized. Left Kidney: Renal measurements: 4.6 x 4.6 x 11.7 cm = volume: 128.1 mL. Echogenicity within normal limits. No mass or hydronephrosis visualized. Bladder: Appears normal for degree of bladder distention. Other: None. IMPRESSION: Unremarkable renal ultrasound. Electronically Signed   By: Ree Molt M.D.   On: 06/27/2024 10:24   DG Chest Port 1 View Result Date: 06/27/2024 EXAM: 1 VIEW(S) XRAY OF THE CHEST 06/27/2024 07:58:45 AM COMPARISON: 06/26/2024 CLINICAL HISTORY: Chest pain and pressure  FINDINGS: LINES, TUBES AND DEVICES: Monitor wires noted. LUNGS AND PLEURA: No focal pulmonary opacity. No pulmonary edema. No pleural effusion. No pneumothorax. HEART AND MEDIASTINUM: No acute abnormality of the cardiac and mediastinal silhouettes. BONES AND SOFT TISSUES: Intact thoracic cage with thoracic spondylosis and mild thoracic dextroscoliosis. IMPRESSION: 1. No acute cardiopulmonary abnormality. Electronically signed by: Waddell Calk MD 06/27/2024 08:14 AM EDT RP Workstation: [id number]   ECHOCARDIOGRAM COMPLETE Result Date: 06/26/2024    ECHOCARDIOGRAM REPORT   Patient Name:   BAUDELIA SCHROEPFER Bradeen Date of Exam: 06/26/2024 Medical Rec #:  [id number]      Height:       63.0 in Accession #:    [id number]     Weight:       229.5 lb Date of Birth:  [birth year]      BSA:          2.050 m Patient Age:    63 years       BP:  123/91 mmHg Patient Gender: F              HR:           66 bpm. Exam Location:  [facility] Procedure: 2D Echo, Cardiac Doppler and Color Doppler (Both Spectral and Color            Flow Doppler were utilized during procedure). Indications:    CHF-Acute Diastolic I50.31  History:        Patient has prior history of Echocardiogram examinations, most                 recent 05/06/2024. Arrythmias:Atrial Fibrillation; Risk                 Factors:Hypertension and Diabetes.  Sonographer:    Jayson Gaskins Referring Phys: [id number] BRENDA MORRISON IMPRESSIONS  1. Left ventricular ejection fraction, by estimation, is 60 to 65%. The left ventricle has normal function. The left ventricle has no regional wall motion abnormalities. There is mild concentric left ventricular hypertrophy. Left ventricular diastolic parameters are indeterminate.  2. Right ventricular systolic function is normal. The right ventricular size is normal. There is normal pulmonary artery systolic pressure. The estimated right ventricular systolic pressure is 26.5 mmHg.  3. The mitral valve is grossly normal. Trivial mitral valve  regurgitation.  4. The aortic valve is tricuspid. There is mild calcification of the aortic valve. Aortic valve regurgitation is not visualized. Aortic valve sclerosis is present, with no evidence of aortic valve stenosis. Aortic valve mean gradient measures 5.0 mmHg.  5. The inferior vena cava is normal in size with <50% respiratory variability, suggesting right atrial pressure of 8 mmHg. Comparison(s): Prior images reviewed side by side. LVEF 60-65%. Normal estimated RVSP. FINDINGS  Left Ventricle: Left ventricular ejection fraction, by estimation, is 60 to 65%. The left ventricle has normal function. The left ventricle has no regional wall motion abnormalities. The left ventricular internal cavity size was normal in size. There is  mild concentric left ventricular hypertrophy. Left ventricular diastolic parameters are indeterminate. Right Ventricle: The right ventricular size is normal. No increase in right ventricular wall thickness. Right ventricular systolic function is normal. There is normal pulmonary artery systolic pressure. The tricuspid regurgitant velocity is 2.15 m/s, and  with an assumed right atrial pressure of 8 mmHg, the estimated right ventricular systolic pressure is 26.5 mmHg. Left Atrium: Left atrial size was normal in size. Right Atrium: Right atrial size was normal in size. Pericardium: There is no evidence of pericardial effusion. Presence of epicardial fat layer. Mitral Valve: The mitral valve is grossly normal. Trivial mitral valve regurgitation. Tricuspid Valve: The tricuspid valve is grossly normal. Tricuspid valve regurgitation is trivial. Aortic Valve: The aortic valve is tricuspid. There is mild calcification of the aortic valve. Aortic valve regurgitation is not visualized. Aortic valve sclerosis is present, with no evidence of aortic valve stenosis. Aortic valve mean gradient measures 5.0 mmHg. Aortic valve peak gradient measures 7.8 mmHg. Aortic valve area, by VTI measures 2.09  cm. Pulmonic Valve: The pulmonic valve was not well visualized. Pulmonic valve regurgitation is trivial. Aorta: The aortic root is normal in size and structure. Venous: The inferior vena cava was not well visualized. The inferior vena cava is normal in size with less than 50% respiratory variability, suggesting right atrial pressure of 8 mmHg. IAS/Shunts: No atrial level shunt detected by color flow Doppler. Additional Comments: 3D was performed not requiring image post processing on an independent workstation and was indeterminate.  LEFT VENTRICLE PLAX 2D LVIDd:         4.60 cm   Diastology LVIDs:         3.00 cm   LV e' medial:    4.79 cm/s LV PW:         1.10 cm   LV E/e' medial:  18.4 LV IVS:        1.10 cm   LV e' lateral:   6.74 cm/s LVOT diam:     2.00 cm   LV E/e' lateral: 13.1 LV SV:         77 LV SV Index:   38 LVOT Area:     3.14 cm  RIGHT VENTRICLE RV S prime:     13.80 cm/s TAPSE (M-mode): 2.1 cm LEFT ATRIUM           Index        RIGHT ATRIUM           Index LA Vol (A2C): 66.4 ml 32.39 ml/m  RA Area:     14.90 cm LA Vol (A4C): 37.9 ml 18.49 ml/m  RA Volume:   38.70 ml  18.88 ml/m  AORTIC VALVE AV Area (Vmax):    2.18 cm AV Area (Vmean):   2.15 cm AV Area (VTI):     2.09 cm AV Vmax:           140.00 cm/s AV Vmean:          112.000 cm/s AV VTI:            0.370 m AV Peak Grad:      7.8 mmHg AV Mean Grad:      5.0 mmHg LVOT Vmax:         97.20 cm/s LVOT Vmean:        76.500 cm/s LVOT VTI:          0.246 m LVOT/AV VTI ratio: 0.66  AORTA Ao Root diam: 2.90 cm MITRAL VALVE               TRICUSPID VALVE MV Area (PHT): 2.39 cm    TR Peak grad:   18.5 mmHg MV Decel Time: 318 msec    TR Vmax:        215.00 cm/s MV E velocity: 88.10 cm/s MV A velocity: 84.30 cm/s  SHUNTS MV E/A ratio:  1.05        Systemic VTI:  0.25 m                            Systemic Diam: 2.00 cm Jayson Sierras MD Electronically signed by Jayson Sierras MD Signature Date/Time: 06/26/2024/3:25:41 PM    Final      Assessment    Wilhemenia Wrenley Sayed is a 63 y.o. year old female with past medicatl history of CAD, NSTEMI s/p DES 2022, DVT, DM, HLD, HTN, GERD, CKD with likely need for dialysis in future, gastroparesis, asthma, bipolar, afib on Eliquis , anemia with chronic disease and IDA, multiple blood transfusions and iron infusions over course of this year, presenting this admission with chest pain and LLQ abdominal pain. GI has been consulted due to acute on chronic anemia with concern for occult GI bleed. She also reports chronic, intermittent LLQ abdominal pain, need for multiple IV iron infusions and blood transfusions over this past year, and strong family history of cirrhosis.   Acute on chronic anemia with history of transfusion dependent anemia: Hgb baseline 7/8 range, with admitting  9.1 and now 6.8 today but without overt GI bleeding, obvious melena, hematochezia, etc. Hemoccult status unknown. Regardless, has had evidence for notable IDA, transfusion dependent anemia, prior EGD in March/April 2025 at outside hospital with reported gastric polyps as possible source for occult blood loss. Unable to exclude small bowel AVMs as source for stuttering blood loss in setting of antiplatelet and anticoagulation chronically. Also unable to r/o occult colonic lesion. Could have ongoing blood loss from anywhere in the GI tract. Eliquis  last received yesterday evening. Recommend optimization from cardiac standpoint first, and could repeat EGD while inpatient, possible colonoscopy if patient is optimized. She prefers to wait as outpatient for colonoscopy. Would benefit from establishing care with Hematology as well for serial iron infusions and close monitoring.   LLQ abdominal pain: without obvious signs of diverticulitis on non-contrast CT and does note improvement after bowel movement but denies any constipation and feels stools are productive. She does have 2-3 bowel movements per day but denies diarrhea. Could consider dicyclomine but  would want to avoid constipation in this scenario. Recommend outpatient colonoscopy.   Prior enteritis: on [facility] at [facility] on 10/7. Not appreciated on non-contrast CT during this admission. With IDA and constellation of symptoms, also with reporting sister with possible IBD, recommend ileocolonoscopy as outpatient and further dedicated imaging may be needed in light of IDA to include capsule (recommend Agile first).   Strong family history of cirrhosis: hepatic steatosis noted on prior imaging and splenomegaly but no thrombocytopenia. LFTs normal. Unclear etiology for family member's liver disease but does not appear alcohol was a factor. As outpatient recommend more intensive evaluation. Does not appear to have advanced disease currently and no signs of portal hypertension.    Plan / Recommendations    Agree with holding Eliquis . In light of acute on chronic kidney disease, may need longer washout period (48-72 hours). Last dose yesterday evening so earliest diagnostic endoscopy would be tomorrow or possibly Wednesday Agree with cardiology consultation: await recommendations. Patient needs to be optimized from cardiac and renal standpoint prior to any procedures Agree with transfusion. Iron levels pending. Would also benefit from IV iron while inpatient and Hematology referral as outpatient Recommend at least EGD this admission in order to re-evaluate as felt to have bleeding from gastric polyps earlier this year (March/April). Could pursue ileocolonoscopy as outpatient, as patient is favoring colonoscopy outpatient. Additionally, small bowel imaging can be as outpatient as well. Unable to exclude AVMs  Check celiac serologies as well, and with ?enteritis on prior imaging and possible FH IBD, will check fecal cal, CRP, sed rate. Will need further evaluation as outpatient  Appreciate Nephrology and Cardiology following Timing of endoscopic procedures to be determined     06/28/2024, 11:24  AM  Therisa MICAEL Stager, PhD, ANP-BC [facility]

## 2024-06-28 NOTE — Plan of Care (Addendum)
 Hgb 6.8 this morning, confirmed with a redraw. Covering provider notified. Provider placed order to transfuse 1 unit of pRBC. Pt has no obvious s/s of bleeding. Per pt BM yesterday was brown and normal. Pt continues to c/o bilateral leg pain and intermittent mid chest pain that improves or resolves with PRN medication. VS at baseline.    Problem: Education: Goal: Ability to describe self-care measures that may prevent or decrease complications (Diabetes Survival Skills Education) will improve Outcome: Progressing   Problem: Coping: Goal: Ability to adjust to condition or change in health will improve Outcome: Progressing   Problem: Fluid Volume: Goal: Ability to maintain a balanced intake and output will improve Outcome: Progressing   Problem: Metabolic: Goal: Ability to maintain appropriate glucose levels will improve Outcome: Progressing   Problem: Nutritional: Goal: Maintenance of adequate nutrition will improve Outcome: Progressing

## 2024-06-28 NOTE — Consult Note (Signed)
 Cardiology Consultation   Patient ID: [name] MRN: [id number]; DOB: 1960/10/25  Admit date: 06/25/2024 Date of Consult: 06/28/2024  PCP:  System, Provider Not In   [facility] Providers Cardiologist:  Vishnu P Mallipeddi, MD      Patient Profile: [name] is a 63 y.o. female with a hx of AD with prior PCI to RCA in 2022 in [place], [place] . Chronic HFpreF, HTN, HLD, DM2, prior DVT on eliquis , PAF, anemia of chronic disease, CKD IV who is being seen 06/28/2024 for the evaluation of chest pain at the request of Dr Vicci.  History of Present Illness: [name] 63 yo female history of CAD with prior PCI to RCA in 2022 in [place], [place] . Chronic HFpreF, HTN, HLD, DM2, prior DVT on eliquis , PAF, anemia of chronic disease, CKD IV, presented with chest pain.  She reports episodes mid to left sided pressure, 8/10 in severity. Can occur at rest or with exertion. Can get diaphoretic. When occurs with walking can have some DOE at those times, but no DOE with symptoms that occur at rest. Last for about 10 minutes. No relation to food or eating.    WBC 5.8 Hgb 9.1 Plt 235 K 4.8 Cr 3.4 BUN 31 ProBNP 1087 Mg 1.9  Trop 33--> 30-->30 -->34-->33 EKG SR, no ischemic changes CXR no acute process  04/2024 nuclear stress: small reversible apical defect, reported combination of ischemia and artifact. Limited evaluaton of the inferior wall due to artifact.  06/2024 echo: LVE 60-65%, no WMAs, indet diastolic function, normal RV  Past Medical History:  Diagnosis Date   Anxiety    Asthma    Atrial fibrillation (HCC)    Bipolar 1 disorder (HCC)    Chronic back pain    Chronic chest pain    Diabetes mellitus without complication (HCC)    DVT (deep venous thrombosis) (HCC) 2017   Gastroparesis    GERD (gastroesophageal reflux disease)    GI bleed 11/2023   required 2 units PRBC transfusion   Hyperlipemia    Hypertension    IBS (irritable bowel syndrome)    Normal cardiac stress  test 02/14/2014   [facility]   Panic attacks     Past Surgical History:  Procedure Laterality Date   ABDOMINAL HYSTERECTOMY  2001   APPENDECTOMY  10/2005   CHOLECYSTECTOMY  10/2005   COLONOSCOPY  2007   Patel ([name]pt reports hemorrhoids   ESOPHAGOGASTRODUODENOSCOPY  02/28/2006   [name], normal on daily PPI, myultiple hyperplastic polyps   MOUTH SURGERY     RIGHT HEART CATH N/A 04/25/2023   Procedure: RIGHT HEART CATH;  Surgeon: Verlin Lonni BIRCH, MD;  Location: [facility] INV[facility]IVE [facility];  Service: Cardiovascular;  Laterality: N/A;   RIGHT OOPHORECTOMY  1999     Scheduled Meds:  sodium chloride    Intravenous Once   atorvastatin   40 mg Oral q morning   carvedilol   6.25 mg Oral BID WC   Chlorhexidine Gluconate Cloth  6 each Topical Daily   clopidogrel   75 mg Oral Daily   darbepoetin (ARANESP ) injection - DIALYSIS  100 mcg Subcutaneous Q Mon-1800   famotidine   20 mg Oral Daily   insulin  aspart  0-9 Units Subcutaneous TID AC & HS   isosorbide  mononitrate  60 mg Oral q morning   lamoTRIgine   100 mg Oral BID   pantoprazole   40 mg Oral BID AC   sertraline   100 mg Oral Daily   sodium zirconium cyclosilicate  10 g Oral TID  Continuous Infusions:  PRN Meds: clonazePAM , fentaNYL  (SUBLIMAZE ) injection  Allergies:    Allergies  Allergen Reactions   Gelatin Swelling    No jello of any kind   Iodinated Contrast Media Anaphylaxis, Hives, Itching and Swelling   Methylprednisolone  Sodium Succ Shortness Of Breath   Dicyclomine Hcl Anxiety and Other (See Comments)    shaky and sick GI upset, sick to stomach   Doxycycline Hives and Rash    rash   Glutamic Acid Itching and Swelling   Ketorolac Hives and Rash    Tolerated Toradol in the ED without side effect, hives, or complaint on 02/17/2022   Lisinopril  Swelling    Angioedema (facial, lip, or tongue swelling)   Metformin Nausea And Vomiting and Other (See Comments)    Dyspepsia sick to stomach It made me sick  where I couldn't get out of the bed   Nitroglycerin  Nausea And Vomiting and Swelling    Pt states it makes her too sick   Nsaids Other (See Comments)    Stomach pain/bleeding   Other Itching, Swelling and Other (See Comments)    Solu-Medrol  Mix-O-Vial   Quetiapine Other (See Comments)   Vancomycin     Itching and erythema at infusion site within a few minutes of starting infusion. No systemic evidence of Red Man Syndrome   Blueberry Flavoring Agent (Non-Screening) Nausea And Vomiting and Rash   Cephalexin  Nausea And Vomiting and Nausea Only    sick to stomach   Fish Allergy Rash   Ibuprofen Nausea And Vomiting    GI upset Reports stomach upset 2/2 hx gastric polyps. Not true allergy.   Nitrofuran Derivatives Itching and Nausea And Vomiting    sick to stomach   Shellfish Allergy Itching, Swelling and Rash   Strawberry Flavoring Agent (Non-Screening) Nausea And Vomiting and Rash   Sulfa Antibiotics Nausea And Vomiting and Other (See Comments)    It just makes me real sick    Social History:   Social History   Socioeconomic History   Marital status: Married    Spouse name: Not on file   Number of children: 4   Years of education: Not on file   Highest education level: Not on file  Occupational History   Occupation: disabled  Tobacco Use   Smoking status: Passive Smoke Exposure - Never Smoker   Smokeless tobacco: Never  Vaping Use   Vaping status: Never Used  Substance and Sexual Activity   Alcohol use: No   Drug use: No   Sexual activity: Not Currently  Other Topics Concern   Not on file  Social History Narrative   Lives w/ husband, daughter, son-in-law & grandkids   Social Drivers of Corporate investment banker Strain: Not on file  Food Insecurity: No Food Insecurity (06/26/2024)   Hunger Vital Sign    Worried About Running Out of Food in the Last Year: Never true    Ran Out of Food in the Last Year: Never true  Transportation Needs: No Transportation Needs  (06/26/2024)   PRAPARE - Administrator, Civil Service (Medical): No    Lack of Transportation (Non-Medical): No  Recent Concern: Transportation Needs - Unmet Transportation Needs (05/06/2024)   PRAPARE - Administrator, Civil Service (Medical): Yes    Lack of Transportation (Non-Medical): Yes  Physical Activity: Not on file  Stress: Not on file  Social Connections: Not on file  Intimate Partner Violence: Not At Risk (06/26/2024)   Humiliation, Afraid,  Rape, and Kick questionnaire    Fear of Current or Ex-Partner: No    Emotionally Abused: No    Physically Abused: No    Sexually Abused: No    Family History:    Family History  Problem Relation Age of Onset   Cirrhosis Mother 33       ?etiology   Cirrhosis Father        ?etiology   Cirrhosis Sister 30       ?etiology   Diverticulitis Sister      ROS:  Please see the history of present illness.   All other ROS reviewed and negative.     Physical Exam/Data: Vitals:   06/28/24 0337 06/28/24 0406 06/28/24 0634 06/28/24 0941  BP: (!) 140/60   (!) 146/70  Pulse: 80   79  Resp: 15 17    Temp: 98.4 F (36.9 C)     TempSrc: Oral     SpO2: 97%     Weight:   100.5 kg   Height:        Intake/Output Summary (Last 24 hours) at 06/28/2024 1019 Last data filed at 06/28/2024 0634 Gross per 24 hour  Intake 900 ml  Output 1100 ml  Net -200 ml      06/28/2024    6:34 AM 06/25/2024    9:29 PM 05/27/2024   11:28 PM  Last 3 Weights  Weight (lbs) 221 lb 9 oz 229 lb 8 oz 229 lb 8 oz  Weight (kg) 100.5 kg 104.1 kg 104.1 kg     Body mass index is 38.03 kg/m.  General:  Well nourished, well developed, in no acute distress HEENT: normal Neck: no JVD Vascular: No carotid bruits; Distal pulses 2+ bilaterally Cardiac:  normal S1, S2; RRR; no murmur  Lungs:  clear to auscultation bilaterally, no wheezing, rhonchi or rales  Abd: soft, nontender, no hepatomegaly  Ext: no edema Musculoskeletal:  No  deformities, BUE and BLE strength normal and equal Skin: warm and dry  Neuro:  CNs 2-12 intact, no focal abnormalities noted Psych:  Normal affect     Laboratory Data: High Sensitivity Troponin:  No results for input(s): TROPONINIHS in the last 720 hours.   Chemistry Recent Labs  Lab 06/25/24 2329 06/27/24 0426 06/28/24 0246  NA 133* 136 137  K 4.8 4.9 5.3*  CL 99 101 102  CO2 22 23 21*  GLUCOSE 238* 148* 165*  BUN 31* 43* 51*  CREATININE 3.40* 5.50* 6.89*  CALCIUM  9.5 8.8* 8.7*  MG 1.9  --   --   GFRNONAA 15* 8* 6*  ANIONGAP 12 12 14     Recent Labs  Lab 06/25/24 2329 06/28/24 0246  PROT 9.5*  --   ALBUMIN  4.1 3.3*  AST 10*  --   ALT 11  --   ALKPHOS 82  --   BILITOT 0.5  --    Lipids No results for input(s): CHOL, TRIG, HDL, LABVLDL, LDLCALC, CHOLHDL in the last 168 hours.  Hematology Recent Labs  Lab 06/25/24 2329 06/28/24 0414  WBC 5.8 6.2  RBC 2.94* 2.21*  HGB 9.1* 6.8*  HCT 27.1* 20.7*  MCV 92.2 93.7  MCH 31.0 30.8  MCHC 33.6 32.9  RDW 16.0* 16.3*  PLT 235 152   Thyroid No results for input(s): TSH, FREET4 in the last 168 hours.  BNP Recent Labs  Lab 06/25/24 2329  PROBNP 1,087.0*    DDimer No results for input(s): DDIMER in the last 168 hours.  Radiology/Studies:  US  RENAL Result Date: 06/27/2024 CLINICAL DATA:  [id number] AKI (acute kidney injury) [id number]. EXAM: RENAL / URINARY TRACT ULTRASOUND COMPLETE COMPARISON:  None Available. FINDINGS: Right Kidney: Renal measurements: 4.7 x 4.8 x 10.4 cm = volume: 121.6 mL. Echogenicity within normal limits. No mass or hydronephrosis visualized. Left Kidney: Renal measurements: 4.6 x 4.6 x 11.7 cm = volume: 128.1 mL. Echogenicity within normal limits. No mass or hydronephrosis visualized. Bladder: Appears normal for degree of bladder distention. Other: None. IMPRESSION: Unremarkable renal ultrasound. Electronically Signed   By: Ree Molt M.D.   On: 06/27/2024 10:24   DG Chest Port 1  View Result Date: 06/27/2024 EXAM: 1 VIEW(S) XRAY OF THE CHEST 06/27/2024 07:58:45 AM COMPARISON: 06/26/2024 CLINICAL HISTORY: Chest pain and pressure FINDINGS: LINES, TUBES AND DEVICES: Monitor wires noted. LUNGS AND PLEURA: No focal pulmonary opacity. No pulmonary edema. No pleural effusion. No pneumothorax. HEART AND MEDIASTINUM: No acute abnormality of the cardiac and mediastinal silhouettes. BONES AND SOFT TISSUES: Intact thoracic cage with thoracic spondylosis and mild thoracic dextroscoliosis. IMPRESSION: 1. No acute cardiopulmonary abnormality. Electronically signed by: Waddell Calk MD 06/27/2024 08:14 AM EDT RP Workstation: [id number]   ECHOCARDIOGRAM COMPLETE Result Date: 06/26/2024    ECHOCARDIOGRAM REPORT   Patient Name:   [name] Kazmierczak Date of Exam: 06/26/2024 Medical Rec #:  [id number]      Height:       63.0 in Accession #:    [id number]     Weight:       229.5 lb Date of Birth:  1961-01-02      BSA:          2.050 m Patient Age:    63 years       BP:           123/91 mmHg Patient Gender: F              HR:           66 bpm. Exam Location:  [facility] Procedure: 2D Echo, Cardiac Doppler and Color Doppler (Both Spectral and Color            Flow Doppler were utilized during procedure). Indications:    CHF-Acute Diastolic I50.31  History:        Patient has prior history of Echocardiogram examinations, most                 recent 05/06/2024. Arrythmias:Atrial Fibrillation; Risk                 Factors:Hypertension and Diabetes.  Sonographer:    Jayson Gaskins Referring Phys: [id number] BRENDA MORRISON IMPRESSIONS  1. Left ventricular ejection fraction, by estimation, is 60 to 65%. The left ventricle has normal function. The left ventricle has no regional wall motion abnormalities. There is mild concentric left ventricular hypertrophy. Left ventricular diastolic parameters are indeterminate.  2. Right ventricular systolic function is normal. The right ventricular size is normal. There is normal  pulmonary artery systolic pressure. The estimated right ventricular systolic pressure is 26.5 mmHg.  3. The mitral valve is grossly normal. Trivial mitral valve regurgitation.  4. The aortic valve is tricuspid. There is mild calcification of the aortic valve. Aortic valve regurgitation is not visualized. Aortic valve sclerosis is present, with no evidence of aortic valve stenosis. Aortic valve mean gradient measures 5.0 mmHg.  5. The inferior vena cava is normal in size with <50% respiratory variability, suggesting right atrial pressure of 8 mmHg. Comparison(s): Prior images reviewed side by side. LVEF  60-65%. Normal estimated RVSP. FINDINGS  Left Ventricle: Left ventricular ejection fraction, by estimation, is 60 to 65%. The left ventricle has normal function. The left ventricle has no regional wall motion abnormalities. The left ventricular internal cavity size was normal in size. There is  mild concentric left ventricular hypertrophy. Left ventricular diastolic parameters are indeterminate. Right Ventricle: The right ventricular size is normal. No increase in right ventricular wall thickness. Right ventricular systolic function is normal. There is normal pulmonary artery systolic pressure. The tricuspid regurgitant velocity is 2.15 m/s, and  with an assumed right atrial pressure of 8 mmHg, the estimated right ventricular systolic pressure is 26.5 mmHg. Left Atrium: Left atrial size was normal in size. Right Atrium: Right atrial size was normal in size. Pericardium: There is no evidence of pericardial effusion. Presence of epicardial fat layer. Mitral Valve: The mitral valve is grossly normal. Trivial mitral valve regurgitation. Tricuspid Valve: The tricuspid valve is grossly normal. Tricuspid valve regurgitation is trivial. Aortic Valve: The aortic valve is tricuspid. There is mild calcification of the aortic valve. Aortic valve regurgitation is not visualized. Aortic valve sclerosis is present, with no evidence  of aortic valve stenosis. Aortic valve mean gradient measures 5.0 mmHg. Aortic valve peak gradient measures 7.8 mmHg. Aortic valve area, by VTI measures 2.09 cm. Pulmonic Valve: The pulmonic valve was not well visualized. Pulmonic valve regurgitation is trivial. Aorta: The aortic root is normal in size and structure. Venous: The inferior vena cava was not well visualized. The inferior vena cava is normal in size with less than 50% respiratory variability, suggesting right atrial pressure of 8 mmHg. IAS/Shunts: No atrial level shunt detected by color flow Doppler. Additional Comments: 3D was performed not requiring image post processing on an independent workstation and was indeterminate.  LEFT VENTRICLE PLAX 2D LVIDd:         4.60 cm   Diastology LVIDs:         3.00 cm   LV e' medial:    4.79 cm/s LV PW:         1.10 cm   LV E/e' medial:  18.4 LV IVS:        1.10 cm   LV e' lateral:   6.74 cm/s LVOT diam:     2.00 cm   LV E/e' lateral: 13.1 LV SV:         77 LV SV Index:   38 LVOT Area:     3.14 cm  RIGHT VENTRICLE RV S prime:     13.80 cm/s TAPSE (M-mode): 2.1 cm LEFT ATRIUM           Index        RIGHT ATRIUM           Index LA Vol (A2C): 66.4 ml 32.39 ml/m  RA Area:     14.90 cm LA Vol (A4C): 37.9 ml 18.49 ml/m  RA Volume:   38.70 ml  18.88 ml/m  AORTIC VALVE AV Area (Vmax):    2.18 cm AV Area (Vmean):   2.15 cm AV Area (VTI):     2.09 cm AV Vmax:           140.00 cm/s AV Vmean:          112.000 cm/s AV VTI:            0.370 m AV Peak Grad:      7.8 mmHg AV Mean Grad:      5.0 mmHg LVOT Vmax:  97.20 cm/s LVOT Vmean:        76.500 cm/s LVOT VTI:          0.246 m LVOT/AV VTI ratio: 0.66  AORTA Ao Root diam: 2.90 cm MITRAL VALVE               TRICUSPID VALVE MV Area (PHT): 2.39 cm    TR Peak grad:   18.5 mmHg MV Decel Time: 318 msec    TR Vmax:        215.00 cm/s MV E velocity: 88.10 cm/s MV A velocity: 84.30 cm/s  SHUNTS MV E/A ratio:  1.05        Systemic VTI:  0.25 m                             Systemic Diam: 2.00 cm Jayson Sierras MD Electronically signed by Jayson Sierras MD Signature Date/Time: 06/26/2024/3:25:41 PM    Final    CT ABDOMEN PELVIS WO CONTRAST Result Date: 06/26/2024 CLINICAL DATA:  Chest pain EXAM: CT ABDOMEN AND PELVIS WITHOUT CONTRAST TECHNIQUE: Multidetector CT imaging of the abdomen and pelvis was performed following the standard protocol without IV contrast. RADIATION DOSE REDUCTION: This exam was performed according to the departmental dose-optimization program which includes automated exposure control, adjustment of the mA and/or kV according to patient size and/or use of iterative reconstruction technique. COMPARISON:  05/28/2024 FINDINGS: Lower chest: No acute abnormality. Hepatobiliary: No focal liver abnormality is seen. Status post cholecystectomy. No biliary dilatation. Pancreas: Unremarkable. No pancreatic ductal dilatation or surrounding inflammatory changes. Spleen: Normal in size without focal abnormality. Adrenals/Urinary Tract: Adrenal glands are within normal limits. Kidneys are well visualized bilaterally. The bladder is decompressed. Punctate nonobstructing renal stones are noted in the lower poles bilaterally. Stomach/Bowel: No obstructive or inflammatory changes of the colon are noted. The appendix has been surgically removed. Small bowel and stomach are within normal limits. Vascular/Lymphatic: Aortic atherosclerosis. No enlarged abdominal or pelvic lymph nodes. Reproductive: Status post hysterectomy. No adnexal masses. Other: No abdominal wall hernia or abnormality. No abdominopelvic ascites. Musculoskeletal: No acute or significant osseous findings. IMPRESSION: Punctate nonobstructing renal calculi. No other focal abnormality is noted. Electronically Signed   By: Oneil Devonshire M.D.   On: 06/26/2024 02:40   DG Chest Portable 1 View Result Date: 06/26/2024 CLINICAL DATA:  Chest pain for several hours, initial encounter EXAM: PORTABLE CHEST 1 VIEW  COMPARISON:  05/05/2024 FINDINGS: The heart size and mediastinal contours are within normal limits. Both lungs are clear. The visualized skeletal structures are unremarkable. IMPRESSION: No active disease. Electronically Signed   By: Oneil Devonshire M.D.   On: 06/26/2024 02:37     Assessment and Plan: 1.Chest pain/CAD - PCI to RCA in 2022 in [place], [place]  - long history of chest pain - no objective evidence of ischemia by EKG or enzymes. Mild flat trop in setting of advanced CKD, HFpEF  not consistent with ACS.  - echo normal LVEF, no WMAs - 04/2024 nuclear stress limited by report but no clear ischemia - current presentation complicated by Hgb down to 6.8 being worked up for GI bleed, AKI on CKD Cr up to 6.89 with a GFR of 6. In general she is not a cath candidate.  - will review prior nuclear stress, at this time no plans for repeat ischemic testing. Follow symptoms with management of her anemia and volume status with progressive renal failure. Increase imdur  to 90mg  daily.  - medical  therapy with atorvastaitn 40, coreg  6.25mg  bid, plavix  75mg , imdur  60mg . Has been on plavix  since prior stent, unclear to me the long term plans for use while also on eliquis , I don't see that she has followed with cardiology as outpatient.    2.PAF - hold eliquis  with drop in Hgb   3. AKI on CKD - per nephrology, Cr up to 6.89       For questions or updates, please contact [facility] Please consult [email or web address] for contact info under      Signed, Alvan Carrier, MD  06/28/2024 10:19 AM

## 2024-06-29 ENCOUNTER — Inpatient Hospital Stay (HOSPITAL_COMMUNITY): Admitting: Anesthesiology

## 2024-06-29 ENCOUNTER — Other Ambulatory Visit: Payer: Self-pay

## 2024-06-29 ENCOUNTER — Encounter (HOSPITAL_COMMUNITY): Payer: Self-pay | Admitting: Acute Care

## 2024-06-29 ENCOUNTER — Encounter (HOSPITAL_COMMUNITY)
Admission: EM | Disposition: A | Discharge status: Home / Self Care | Payer: Self-pay | Source: Home / Self Care | Attending: Family Medicine

## 2024-06-29 DIAGNOSIS — D649 Anemia, unspecified: Secondary | ICD-10-CM | POA: Diagnosis not present

## 2024-06-29 DIAGNOSIS — R0789 Other chest pain: Secondary | ICD-10-CM | POA: Diagnosis not present

## 2024-06-29 DIAGNOSIS — D509 Iron deficiency anemia, unspecified: Secondary | ICD-10-CM

## 2024-06-29 DIAGNOSIS — I129 Hypertensive chronic kidney disease with stage 1 through stage 4 chronic kidney disease, or unspecified chronic kidney disease: Secondary | ICD-10-CM

## 2024-06-29 DIAGNOSIS — I202 Refractory angina pectoris: Secondary | ICD-10-CM | POA: Diagnosis not present

## 2024-06-29 DIAGNOSIS — N179 Acute kidney failure, unspecified: Secondary | ICD-10-CM | POA: Diagnosis not present

## 2024-06-29 DIAGNOSIS — E782 Mixed hyperlipidemia: Secondary | ICD-10-CM

## 2024-06-29 DIAGNOSIS — T182XXA Foreign body in stomach, initial encounter: Secondary | ICD-10-CM

## 2024-06-29 DIAGNOSIS — I10 Essential (primary) hypertension: Secondary | ICD-10-CM | POA: Diagnosis not present

## 2024-06-29 DIAGNOSIS — I2510 Atherosclerotic heart disease of native coronary artery without angina pectoris: Secondary | ICD-10-CM | POA: Diagnosis not present

## 2024-06-29 DIAGNOSIS — N184 Chronic kidney disease, stage 4 (severe): Secondary | ICD-10-CM

## 2024-06-29 DIAGNOSIS — I4819 Other persistent atrial fibrillation: Secondary | ICD-10-CM

## 2024-06-29 HISTORY — PX: ESOPHAGOGASTRODUODENOSCOPY: SHX5428

## 2024-06-29 LAB — GLUCOSE, CAPILLARY
Glucose-Capillary: 143 mg/dL — ABNORMAL HIGH (ref 70–99)
Glucose-Capillary: 161 mg/dL — ABNORMAL HIGH (ref 70–99)
Glucose-Capillary: 160 mg/dL — ABNORMAL HIGH (ref 70–99)
Glucose-Capillary: 133 mg/dL — ABNORMAL HIGH (ref 70–99)

## 2024-06-29 LAB — RENAL FUNCTION PANEL
Albumin: 3.3 g/dL — ABNORMAL LOW (ref 3.5–5.0)
Anion gap: 13 (ref 5–15)
BUN: 59 mg/dL — ABNORMAL HIGH (ref 8–23)
CO2: 21 mmol/L — ABNORMAL LOW (ref 22–32)
Calcium: 8.8 mg/dL — ABNORMAL LOW (ref 8.9–10.3)
Chloride: 101 mmol/L (ref 98–111)
Creatinine, Ser: 6.89 mg/dL — ABNORMAL HIGH (ref 0.44–1.00)
GFR, Estimated: 6 mL/min — ABNORMAL LOW (ref >60)
Glucose, Bld: 159 mg/dL — ABNORMAL HIGH (ref 70–99)
Phosphorus: 6.2 mg/dL — ABNORMAL HIGH (ref 2.5–4.6)
Potassium: 5.3 mmol/L — ABNORMAL HIGH (ref 3.5–5.1)
Sodium: 135 mmol/L (ref 135–145)

## 2024-06-29 LAB — CBC
HCT: 23.2 % — ABNORMAL LOW (ref 36.0–46.0)
Hemoglobin: 7.7 g/dL — ABNORMAL LOW (ref 12.0–15.0)
MCH: 30.3 pg (ref 26.0–34.0)
MCHC: 33.2 g/dL (ref 30.0–36.0)
MCV: 91.3 fL (ref 80.0–100.0)
Platelets: 135 K/uL — ABNORMAL LOW (ref 150–400)
RBC: 2.54 MIL/uL — ABNORMAL LOW (ref 3.87–5.11)
RDW: 16.8 % — ABNORMAL HIGH (ref 11.5–15.5)
WBC: 5.5 K/uL (ref 4.0–10.5)
nRBC: 0 % (ref 0.0–0.2)

## 2024-06-29 LAB — GLIADIN ANTIBODIES, SERUM
Antigliadin Abs, IgA: 1 U (ref 0–19)
Gliadin IgG: 1 U (ref 0–19)

## 2024-06-29 SURGERY — EGD (ESOPHAGOGASTRODUODENOSCOPY)
Anesthesia: General

## 2024-06-29 MED ORDER — LACTATED RINGERS IV SOLN: INTRAVENOUS | Status: DC

## 2024-06-29 MED ORDER — SODIUM ZIRCONIUM CYCLOSILICATE 10 G PO PACK
10.0000 g | PACK | Freq: Three times a day (TID) | ORAL | Status: AC
  Administered 2024-06-29 (×3): 10 g via ORAL
  Filled 2024-06-29 (×3): qty 1

## 2024-06-29 MED ORDER — PROPOFOL 500 MG/50ML IV EMUL
INTRAVENOUS | Status: DC | PRN
  Administered 2024-06-29: 50 mg via INTRAVENOUS

## 2024-06-29 MED ORDER — SODIUM CHLORIDE 0.9 % IV SOLN: INTRAVENOUS | Status: DC

## 2024-06-29 MED ORDER — LIDOCAINE 2% (20 MG/ML) 5 ML SYRINGE
INTRAMUSCULAR | Status: DC | PRN
  Administered 2024-06-29: 100 mg via INTRAVENOUS

## 2024-06-29 MED ORDER — ORAL CARE MOUTH RINSE: 15.0000 mL | OROMUCOSAL | Status: DC | PRN

## 2024-06-29 MED ORDER — METOCLOPRAMIDE HCL 5 MG/ML IJ SOLN
10.0000 mg | Freq: Once | INTRAMUSCULAR | Status: AC
  Administered 2024-06-29: 10 mg via INTRAVENOUS
  Filled 2024-06-29: qty 2

## 2024-06-29 NOTE — Progress Notes (Signed)
 Rounding Note   Patient Name: [name] Date of Encounter: 06/29/2024  [facility] Cardiologist: Diannah SHAUNNA Maywood, MD   Subjective Short episode of chest pain overnight  Scheduled Meds:  sodium chloride    Intravenous Once   atorvastatin   40 mg Oral q morning   carvedilol   6.25 mg Oral BID WC   Chlorhexidine Gluconate Cloth  6 each Topical Daily   clopidogrel   75 mg Oral Daily   darbepoetin (ARANESP ) injection - DIALYSIS  100 mcg Subcutaneous Q Mon-1800   famotidine   20 mg Oral Daily   insulin  aspart  0-9 Units Subcutaneous TID AC & HS   isosorbide  mononitrate  90 mg Oral q morning   lamoTRIgine   100 mg Oral BID   pantoprazole   40 mg Oral BID AC   sertraline   100 mg Oral Daily   sodium zirconium cyclosilicate  10 g Oral TID   Continuous Infusions:  PRN Meds: clonazePAM , fentaNYL  (SUBLIMAZE ) injection   Vital Signs  Vitals:   06/28/24 1645 06/28/24 1728 06/28/24 2045 06/29/24 0405  BP: (!) 106/52 117/68 (!) 124/58 (!) 144/60  Pulse: 73 74 75 76  Resp:   17 18  Temp: 98.2 F (36.8 C) 98.2 F (36.8 C) 98.1 F (36.7 C) 98.4 F (36.9 C)  TempSrc: Oral Oral Oral Oral  SpO2: 97% 98% 98% 96%  Weight:      Height:        Intake/Output Summary (Last 24 hours) at 06/29/2024 0820 Last data filed at 06/29/2024 0300 Gross per 24 hour  Intake 1337 ml  Output 1300 ml  Net 37 ml      06/28/2024    6:34 AM 06/25/2024    9:29 PM 05/27/2024   11:28 PM  Last 3 Weights  Weight (lbs) 221 lb 9 oz 229 lb 8 oz 229 lb 8 oz  Weight (kg) 100.5 kg 104.1 kg 104.1 kg      Telemetry NSR - Personally Reviewed  ECG  N/a - Personally Reviewed  Physical Exam  GEN: No acute distress.   Neck: No JVD Cardiac: RRR, no murmurs, rubs, or gallops.  Respiratory: Clear to auscultation bilaterally. GI: Soft, nontender, non-distended  MS: No edema; No deformity. Neuro:  Nonfocal  Psych: Normal affect   Labs High Sensitivity Troponin:  No results for input(s):  TROPONINIHS in the last 720 hours.   Chemistry Recent Labs  Lab 06/25/24 2329 06/27/24 0426 06/28/24 0246 06/29/24 0502  NA 133* 136 137 135  K 4.8 4.9 5.3* 5.3*  CL 99 101 102 101  CO2 22 23 21* 21*  GLUCOSE 238* 148* 165* 159*  BUN 31* 43* 51* 59*  CREATININE 3.40* 5.50* 6.89* 6.89*  CALCIUM  9.5 8.8* 8.7* 8.8*  MG 1.9  --   --   --   PROT 9.5*  --   --   --   ALBUMIN  4.1  --  3.3* 3.3*  AST 10*  --   --   --   ALT 11  --   --   --   ALKPHOS 82  --   --   --   BILITOT 0.5  --   --   --   GFRNONAA 15* 8* 6* 6*  ANIONGAP 12 12 14 13     Lipids No results for input(s): CHOL, TRIG, HDL, LABVLDL, LDLCALC, CHOLHDL in the last 168 hours.  Hematology Recent Labs  Lab 06/25/24 2329 06/28/24 0414 06/29/24 0502  WBC 5.8 6.2 5.5  RBC 2.94*  2.21* 2.54*  HGB 9.1* 6.8* 7.7*  HCT 27.1* 20.7* 23.2*  MCV 92.2 93.7 91.3  MCH 31.0 30.8 30.3  MCHC 33.6 32.9 33.2  RDW 16.0* 16.3* 16.8*  PLT 235 152 135*   Thyroid No results for input(s): TSH, FREET4 in the last 168 hours.  BNP Recent Labs  Lab 06/25/24 2329  PROBNP 1,087.0*    DDimer No results for input(s): DDIMER in the last 168 hours.   Radiology  US  RENAL Result Date: 06/27/2024 CLINICAL DATA:  [id number] AKI (acute kidney injury) [id number]. EXAM: RENAL / URINARY TRACT ULTRASOUND COMPLETE COMPARISON:  None Available. FINDINGS: Right Kidney: Renal measurements: 4.7 x 4.8 x 10.4 cm = volume: 121.6 mL. Echogenicity within normal limits. No mass or hydronephrosis visualized. Left Kidney: Renal measurements: 4.6 x 4.6 x 11.7 cm = volume: 128.1 mL. Echogenicity within normal limits. No mass or hydronephrosis visualized. Bladder: Appears normal for degree of bladder distention. Other: None. IMPRESSION: Unremarkable renal ultrasound. Electronically Signed   By: Ree Molt M.D.   On: 06/27/2024 10:24      Patient Profile   [name] is a 63 y.o. female with a hx of AD with prior PCI to RCA in 2022 in [place],  [place] . Chronic HFpreF, HTN, HLD, DM2, prior DVT on eliquis , PAF, anemia of chronic disease, CKD IV who is being seen 06/28/2024 for the evaluation of chest pain at the request of Dr Vicci.   Assessment & Plan    1.Chest pain/CAD - PCI to RCA in 2022 in [place], [place]  - long history of chest pain - no objective evidence of ischemia by EKG or enzymes. Mild flat trop in setting of advanced CKD, HFpEF  not consistent with ACS.  - 06/2024 echo normal LVEF, no WMAs - 04/2024 nuclear stress limited by report but no clear ischemia, possible mild apical ischemia though affected by artifact. Severe gut radiotracer uptake could not assess inferior wall in study  - current presentation complicated by Hgb down to 6.8 being worked up for GI bleed, AKI on CKD Cr up to 6.89 with a GFR of 6. In general she is not a cath candidate. Follow symptoms with adjustements of medical therapy, management of her anemia and volume status. F/u EGD planned by GI.  - we increased imdur  to 90mg .  - medical therapy with atorvastaitn 40, coreg  6.25mg  bid, plavix  75mg , imdur  60mg . Has been on plavix  since prior stent, unclear to me the long term plans for use while also on eliquis , I don't see that she has followed with cardiology as outpatient.     2.PAF - hold eliquis  with drop in Hgb     3. AKI on CKD - per nephrology, Cr up to 6.89     For questions or updates, please contact [facility] Please consult [email or web address] for contact info under       Signed, Alvan Carrier, MD  06/29/2024, 8:20 AM

## 2024-06-29 NOTE — Progress Notes (Signed)

## 2024-06-29 NOTE — Brief Op Note (Signed)
 06/29/2024  12:02 PM  PATIENT:  [name]  63 y.o. female  PRE-OPERATIVE DIAGNOSIS:  anemia  POST-OPERATIVE DIAGNOSIS:  food in stomach  PROCEDURE:  Procedure(s): EGD (ESOPHAGOGASTRODUODENOSCOPY) (N/A)  SURGEON:  Surgeons and Role:    * Eartha Angelia Sieving, MD - Primary  Patient underwent EGD under propofol sedation.  Tolerated the procedure adequately.   FINDINGS: - Normal esophagus.  - A large amount of food (residue) in the stomach. Scope was withdrawn due to aspiration risk of large amount of solid food.  RECOMMENDATIONS - Return patient to hospital ward for ongoing care.  - Clear liquid diet. NPO after MN. - Repeat upper endoscopy tomorrow due to the presence of food today. - Daily H/H  Sieving Eartha, MD Gastroenterology and Hepatology [facility]

## 2024-06-29 NOTE — Progress Notes (Signed)
 PROGRESS NOTE   [name]  FMW:[id number] DOB: 01-01-1961 DOA: 06/25/2024 PCP: System, Provider Not In   Chief Complaint  Patient presents with   Chest Pain   Level of care: Telemetry  Brief Admission History:  63 y.o. female with medical history significant of generalized anxiety disorder with panic attacks, asthma. Chronic chest pain  CAD, STEMI with PCI x . GI bleed, DVT, Bipolar disorder, DM 2, neuropathy, hyperlipidemia, hypertension, GERD, CKD V, gastroparesis, anemia of chronic disease and a fib.  Patient reports over last 5-6 months has had worsening renal function has experienced decline in mobility tolerance, strength and dizziness and dyspnea when she gets up.  (Utilizes rolling seat walker with assistance from family members for home mobility)now progressed to stage 5. She is followed  with Dr Fernand nephrology in [place] .  This past Wednesday she experienced chest pain accompanied by diaphoresis and nausea. Chest pain described left side of her chest and her left arm. This event occurred again today and her nephrologist referred her to come here to [facility] ER as patient has appointment on 18th. Workup in ED troponin flat 33-30, chest pain resolved with fentanyl  (unable to tolerate nitroglycerin ),  BNP 1087 with normal respirations and sats on room air. Last ECHO reviewed in EPIC 05/06/2024 EF 60-65%, LVH grade I diastolic dysfunction.  Unfortunately her creatinine has been worsening and she reports difficulty urinating and poor urine production.  She is being admitted for further management.     Assessment and Plan:  Chest pain/CAD/STEMI with PCI hx -repeat EKG and troponins have been reassuring -Continue imdur , coreg , plavix , eliquis , lipitor, plavix  -cardiology team increased dose of imdur  to 90 mg Prn fentanyl  for chest pain Monitor on tele ACS has been ruled out  -appreciate cardiology team consult and recommendations   Elevated pro BNP  1087 -- Last echo mild LVH with EF  60-65% and grade I diastolic dysfunction 04/2024 - No current evidence of acute volume overload - Repeat echo LVEF 60-65% with no regional wall motion abnormalities and indeterminate diastolic function    A fib Paroxysmal Currently SR rate controlled Continue coreg  but HOLD apixaban  given drop in Hg to 6.8 Monitor on tele   Type 2 diabetes mellitus with hyperglycemia Continue lantus  ACHS sliding scale insulin  - renal dose  Neurontin  for neuropathy hgbA1C--6.5%  CBG (last 3)  Recent Labs    06/28/24 1933 06/29/24 0717 06/29/24 1051  GLUCAP 182* 143* 161*   AKI on CKD stage V Patient not on HD, unfortunately creatinine worsening Foley cath inserted on 10/12 to rule out obstruction   Requesting inpatient nephrology consultation  Follow daily Renal Function Panel Follow up renal US  no obstruction seen  Hyperkalemia -- lokelma 10 g TID ordered    GAD/Bipolar Continue lamictal , sertraline , klonopin  Has planned psych appointment in 08/2024   Question of GI bleed Protonix  continued Monitor for signs of bleeding and hgb trend Appreciate GI consult and plan for EGD tomorrow 10/15   DVT prophylaxis: apixaban   Code Status: Full  Family Communication:  Disposition: anticipate home    Consultants:  nephrology  Procedures:   Antimicrobials:    Subjective: Says she still has intermittent chest pain, no SOB, no palpitations    Objective: Vitals:   06/29/24 1057 06/29/24 1206 06/29/24 1215 06/29/24 1245  BP: (!) 189/78 (!) 108/59 (!) 131/99 (!) 185/80  Pulse: 75 73 71 71  Resp: 14 19 18 18   Temp: 98.3 F (36.8 C) 98.2 F (36.8 C)  97.8 F (  36.6 C)  TempSrc: Oral   Oral  SpO2: 98% 98% 97% 99%  Weight: 100.5 kg     Height: 5' 4 (1.626 m)       Intake/Output Summary (Last 24 hours) at 06/29/2024 1320 Last data filed at 06/29/2024 1201 Gross per 24 hour  Intake 937 ml  Output 1750 ml  Net -813 ml   Filed Weights   06/25/24 2129 06/28/24 0634 06/29/24 1057   Weight: 104.1 kg 100.5 kg 100.5 kg   Examination:  General exam: Appears calm and comfortable  Respiratory system: Clear to auscultation. Respiratory effort normal. Cardiovascular system: normal S1 & S2 heard. No JVD, murmurs, rubs, gallops or clicks. 1+ pedal edema. Gastrointestinal system: Abdomen is nondistended, soft and nontender. No organomegaly or masses felt. Normal bowel sounds heard. Central nervous system: Alert and oriented. No focal neurological deficits. Extremities: Symmetric 5 x 5 power. Skin: No rashes, lesions or ulcers. Psychiatry: Judgement and insight appear normal. Mood & affect appropriate.   Data Reviewed: I have personally reviewed following labs and imaging studies  CBC: Recent Labs  Lab 06/25/24 2329 06/28/24 0414 06/29/24 0502  WBC 5.8 6.2 5.5  NEUTROABS 3.9  --   --   HGB 9.1* 6.8* 7.7*  HCT 27.1* 20.7* 23.2*  MCV 92.2 93.7 91.3  PLT 235 152 135*    Basic Metabolic Panel: Recent Labs  Lab 06/25/24 2329 06/27/24 0426 06/28/24 0246 06/29/24 0502  NA 133* 136 137 135  K 4.8 4.9 5.3* 5.3*  CL 99 101 102 101  CO2 22 23 21* 21*  GLUCOSE 238* 148* 165* 159*  BUN 31* 43* 51* 59*  CREATININE 3.40* 5.50* 6.89* 6.89*  CALCIUM  9.5 8.8* 8.7* 8.8*  MG 1.9  --   --   --   PHOS  --   --  5.7* 6.2*    CBG: Recent Labs  Lab 06/28/24 1133 06/28/24 1632 06/28/24 1933 06/29/24 0717 06/29/24 1051  GLUCAP 198* 231* 182* 143* 161*    No results found for this or any previous visit (from the past 240 hours).   Radiology Studies: No results found.   Scheduled Meds:  sodium chloride    Intravenous Once   atorvastatin   40 mg Oral q morning   carvedilol   6.25 mg Oral BID WC   Chlorhexidine Gluconate Cloth  6 each Topical Daily   clopidogrel   75 mg Oral Daily   darbepoetin (ARANESP ) injection - DIALYSIS  100 mcg Subcutaneous Q Mon-1800   famotidine   20 mg Oral Daily   insulin  aspart  0-9 Units Subcutaneous TID AC & HS   isosorbide  mononitrate   90 mg Oral q morning   lamoTRIgine   100 mg Oral BID   pantoprazole   40 mg Oral BID AC   sertraline   100 mg Oral Daily   sodium zirconium cyclosilicate  10 g Oral TID   Continuous Infusions:   LOS: 3 days   Time spent: 55 mins  Holly Delisi Vicci, MD How to contact the [facility] Attending or Consulting provider 7A - 7P or covering provider during after hours 7P -7A, for this patient?  Check the care team in [facility] and look for a) attending/consulting TRH provider listed and b) the TRH team listed Log into [email or web address] to find provider on call.  Locate the TRH provider you are looking for under [facility] and page to a number that you can be directly reached. If you still have difficulty reaching the provider, please page the [facility] (Director on Call)  for the Hospitalists listed on amion for assistance.  06/29/2024, 1:20 PM

## 2024-06-29 NOTE — Anesthesia Preprocedure Evaluation (Signed)
 Anesthesia Evaluation  Patient identified by MRN, date of birth, ID band Patient awake    Reviewed: Allergy & Precautions, H&P , NPO status , Patient's Chart, lab work & pertinent test results, reviewed documented beta blocker date and time   Airway Mallampati: II  TM Distance: >3 FB Neck ROM: full    Dental no notable dental hx.    Pulmonary asthma    Pulmonary exam normal breath sounds clear to auscultation       Cardiovascular Exercise Tolerance: Good hypertension, + angina  + CAD and +CHF   Rhythm:regular Rate:Normal     Neuro/Psych  Headaches PSYCHIATRIC DISORDERS Anxiety Depression Bipolar Disorder      GI/Hepatic Neg liver ROS,GERD  ,,  Endo/Other  diabetes    Renal/GU Renal disease  negative genitourinary   Musculoskeletal   Abdominal   Peds  Hematology  (+) Blood dyscrasia, anemia   Anesthesia Other Findings   Reproductive/Obstetrics negative OB ROS                              Anesthesia Physical Anesthesia Plan  ASA: 3 and emergent  Anesthesia Plan: General   Post-op Pain Management:    Induction:   PONV Risk Score and Plan: Propofol infusion  Airway Management Planned:   Additional Equipment:   Intra-op Plan:   Post-operative Plan:   Informed Consent: I have reviewed the patients History and Physical, chart, labs and discussed the procedure including the risks, benefits and alternatives for the proposed anesthesia with the patient or authorized representative who has indicated his/her understanding and acceptance.     Dental Advisory Given  Plan Discussed with: CRNA  Anesthesia Plan Comments:          Anesthesia Quick Evaluation

## 2024-06-29 NOTE — Op Note (Signed)
 [facility] Patient Name: [name] Procedure Date: 06/29/2024 11:45 AM MRN: [id number] Date of Birth: 04-22-1961 Attending MD: Toribio Fortune , , [id number] CSN: [id number] Age: 63 Admit Type: Inpatient Procedure:                Upper GI endoscopy Indications:              Anemia Providers:                Toribio Fortune, Harlene Lips, Crystal Page Referring MD:              Medicines:                Monitored Anesthesia Care Complications:            No immediate complications. Estimated Blood Loss:     Estimated blood loss: none. Procedure:                Pre-Anesthesia Assessment:                           - Prior to the procedure, a History and Physical                            was performed, and patient medications, allergies                            and sensitivities were reviewed. The patient's                            tolerance of previous anesthesia was reviewed.                           - The risks and benefits of the procedure and the                            sedation options and risks were discussed with the                            patient. All questions were answered and informed                            consent was obtained.                           - ASA Grade Assessment: III - A patient with severe                            systemic disease.                           After obtaining informed consent, the endoscope was                            passed under direct vision. Throughout the                            procedure, the patient's blood pressure, pulse, and  oxygen  saturations were monitored continuously. The                            [id number] ([id number])Leezm was introduced through the                            mouth, and advanced to the body of the stomach. The                            upper GI endoscopy was accomplished without                            difficulty. The patient tolerated the  procedure                            well. Scope In: 11:57:54 AM Scope Out: 11:58:39 AM Total Procedure Duration: 0 hours 0 minutes 45 seconds  Findings:      The examined esophagus was normal.      A large amount of food (residue) was found in the gastric body. Scope       was withdrawn due to aspiration risk of large amount of solid food. Impression:               - Normal esophagus.                           - A large amount of food (residue) in the stomach.                           - No specimens collected. Moderate Sedation:      Per Anesthesia Care Recommendation:           - Return patient to hospital ward for ongoing care.                           - Clear liquid diet. NPO after MN.                           - Repeat upper endoscopy tomorrow due to the                            presence of food today.                           - Daily H/H Procedure Code(s):        --- Professional ---                           43235, 52, Esophagogastroduodenoscopy, flexible,                            transoral; diagnostic, including collection of                            specimen(s) by brushing or washing, when performed                            (  separate procedure) Diagnosis Code(s):        --- Professional ---                           D64.9, Anemia, unspecified CPT copyright 2022 American Medical Association. All rights reserved. The codes documented in this report are preliminary and upon coder review may  be revised to meet current compliance requirements. Toribio Fortune, MD Toribio Fortune,  06/29/2024 12:03:05 PM This report has been signed electronically. Number of Addenda: 0

## 2024-06-29 NOTE — Progress Notes (Signed)
 Admit: 06/25/2024 LOS: 3  49F AKI on CKD5, AoC Anemia, recurrent chest pain with history of ASCVD/CAD.   Subjective:  No interval issues Cardiology notes reviewed, current plan med mgmt UOP  1.3L SCr stable 6.9, K 5.3, HCO3 21 stable No c/o or needs, good PO, no N/V  10/13 0701 - 10/14 0700 In: 1337 [P.O.:960; Blood:377] Out: 1300 [Urine:1300]  Filed Weights   06/25/24 2129 06/28/24 0634  Weight: 104.1 kg 100.5 kg    Scheduled Meds:  sodium chloride    Intravenous Once   atorvastatin   40 mg Oral q morning   carvedilol   6.25 mg Oral BID WC   Chlorhexidine Gluconate Cloth  6 each Topical Daily   clopidogrel   75 mg Oral Daily   darbepoetin (ARANESP ) injection - DIALYSIS  100 mcg Subcutaneous Q Mon-1800   famotidine   20 mg Oral Daily   insulin  aspart  0-9 Units Subcutaneous TID AC & HS   isosorbide  mononitrate  90 mg Oral q morning   lamoTRIgine   100 mg Oral BID   pantoprazole   40 mg Oral BID AC   sertraline   100 mg Oral Daily   sodium zirconium cyclosilicate  10 g Oral TID   Continuous Infusions: PRN Meds:.clonazePAM , fentaNYL  (SUBLIMAZE ) injection  Current Labs: reviewed  10/14 RFP: K5.3, bicarbonate 21, creatinine 6.9, BUN 59 consistent with stable unchanged AKI 10/14 CBC: Hemoglobin to 7.7 after 1 unit PRBC yesterday WBC and platelet count remain stable 10/13 iron panel: TSAT 15%, ferritin 326  Physical Exam:  Blood pressure (!) 144/60, pulse 76, temperature 98.4 F (36.9 C), temperature source Oral, resp. rate 18, height 5' 4 (1.626 m), weight 100.5 kg, SpO2 96%. NAD, lying in bed, conversant Regular, normal S1 and S2 Clear bilaterally, normal rhythm No peripheral edema   A AKI on CKD5, stable, high risk of progressing to dialysis dependence.  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  Imaging reassuring.  Specific etiology unclear.  No obvious nephrotoxin exposure or identified threat to volume status.  Perhaps related to #2, worsening of anemia.  Remains at high risk of  progression to dialysis dependence because of very low GFR but currently without uremic symptoms and GFR appears to be stable but low.  Electrolytes are stable.  Continue supportive care at the current time.   Acute on chronic anemia in the setting of anemia of CKD: Transfused 1 unit PRBC 10/13 with appropriate increase in hemoglobin.  TSAT 15% with ferritin 12/09/2008/13.  Aranesp  100 mcg given 10/13.  Trend. Recurrent chest pain with significant history of CAD and PCI: Troponins reassuring.  Cardiology following, currently medical management Mild hyperkalemia:Lokelma has been prescribed.  Agree. Longstanding DM2 with microvascular disease History of GI bleed and A-fib on apixaban  at presentation; held currently 2/2 #2  P Cont supportive care Good PO, no need for IVFs No RRT needs Medication Issues; Preferred narcotic agents for pain control are hydromorphone , fentanyl , and methadone. Morphine  should not be used.  Baclofen should be avoided Avoid oral sodium phosphate and magnesium  citrate based laxatives / bowel preps    Bernardino Gasman MD 06/29/2024, 8:42 AM  Recent Labs  Lab 06/27/24 0426 06/28/24 0246 06/29/24 0502  NA 136 137 135  K 4.9 5.3* 5.3*  CL 101 102 101  CO2 23 21* 21*  GLUCOSE 148* 165* 159*  BUN 43* 51* 59*  CREATININE 5.50* 6.89* 6.89*  CALCIUM  8.8* 8.7* 8.8*  PHOS  --  5.7* 6.2*   Recent Labs  Lab 06/25/24 2329 06/28/24 0414 06/29/24 0502  WBC 5.8 6.2 5.5  NEUTROABS 3.9  --   --   HGB 9.1* 6.8* 7.7*  HCT 27.1* 20.7* 23.2*  MCV 92.2 93.7 91.3  PLT 235 152 135*

## 2024-06-29 NOTE — Transfer of Care (Signed)
 Immediate Anesthesia Transfer of Care Note  Patient: [name]  Procedure(s) Performed: EGD (ESOPHAGOGASTRODUODENOSCOPY)  Patient Location: PACU  Anesthesia Type:General  Level of Consciousness: awake, alert , oriented, and patient cooperative  Airway & Oxygen  Therapy: Patient Spontanous Breathing  Post-op Assessment: Report given to RN, Post -op Vital signs reviewed and stable, and Patient moving all extremities X 4  Post vital signs: Reviewed and stable  Last Vitals:  Vitals Value Taken Time  BP 108/59 06/29/24 12:05  Temp    Pulse 70 06/29/24 12:10  Resp 20 06/29/24 12:10  SpO2 98 % 06/29/24 12:10  Vitals shown include unfiled device data.  Last Pain:  Vitals:   06/29/24 1153  TempSrc:   PainSc: 0-No pain      Patients Stated Pain Goal: 3 (06/29/24 1057)  Complications: No notable events documented.

## 2024-06-29 NOTE — Plan of Care (Signed)

## 2024-06-29 NOTE — Progress Notes (Signed)
 Gastroenterology Progress Note   Referring Provider: No ref. provider found Primary Care Physician:  System, Provider Not In Primary Gastroenterologist: previously unassigned (Dr. Cindie)  Patient ID: [name]; [id number]; 1961-06-01    Subjective   Continues to have some complaints of intermittent chest pain but biggest complaint is her leg pain and cramps. Also foley catheter irritating. Overall stable and denies abdominal pain, melena, brbpr. Is thirsty and would like to drink as soon as able. Typically has 3 BM daily but no BM since admission but receiving pain medication.    Objective   Vital signs in last 24 hours Temp:  [97.9 F (36.6 C)-98.4 F (36.9 C)] 98.4 F (36.9 C) (10/14 0405) Pulse Rate:  [73-76] 76 (10/14 0405) Resp:  [17-18] 18 (10/14 0405) BP: (106-147)/(52-68) 144/60 (10/14 0405) SpO2:  [96 %-98 %] 96 % (10/14 0405) Last BM Date : 06/26/24  Physical Exam General:   Alert and oriented, pleasant Head:  Normocephalic and atraumatic. Eyes:  No icterus, sclera clear. Conjuctiva pink.  Mouth:  Without lesions, mucosa pink and moist.  Neck:  Supple, without thyromegaly or masses.  Heart:  S1, S2 present, no murmurs noted.  Lungs: Clear to auscultation bilaterally, without wheezing, rales, or rhonchi.  Abdomen:  Bowel sounds present, soft, non-tender, non-distended. No HSM or hernias noted. No rebound or guarding. No masses appreciated  Neurologic:  Alert and  oriented x4;  grossly normal neurologically. Skin:  Warm and dry, intact without significant lesions.  Psych:  Alert and cooperative. Normal mood and affect.  Intake/Output from previous day: 10/13 0701 - 10/14 0700 In: 1337 [P.O.:960; Blood:377] Out: 1300 [Urine:1300] Intake/Output this shift: Total I/O In: -  Out: 450 [Urine:450]  Lab Results  Recent Labs    06/28/24 0414 06/29/24 0502  WBC 6.2 5.5  HGB 6.8* 7.7*  HCT 20.7* 23.2*  PLT 152 135*   BMET Recent Labs     06/27/24 0426 06/28/24 0246 06/29/24 0502  NA 136 137 135  K 4.9 5.3* 5.3*  CL 101 102 101  CO2 23 21* 21*  GLUCOSE 148* 165* 159*  BUN 43* 51* 59*  CREATININE 5.50* 6.89* 6.89*  CALCIUM  8.8* 8.7* 8.8*   LFT Recent Labs    06/28/24 0246 06/29/24 0502  ALBUMIN  3.3* 3.3*   PT/INR No results for input(s): LABPROT, INR in the last 72 hours. Hepatitis Panel No results for input(s): HEPBSAG, HCVAB, HEPAIGM, HEPBIGM in the last 72 hours.  Studies/Results US  RENAL Result Date: 06/27/2024 CLINICAL DATA:  [id number] AKI (acute kidney injury) [id number]. EXAM: RENAL / URINARY TRACT ULTRASOUND COMPLETE COMPARISON:  None Available. FINDINGS: Right Kidney: Renal measurements: 4.7 x 4.8 x 10.4 cm = volume: 121.6 mL. Echogenicity within normal limits. No mass or hydronephrosis visualized. Left Kidney: Renal measurements: 4.6 x 4.6 x 11.7 cm = volume: 128.1 mL. Echogenicity within normal limits. No mass or hydronephrosis visualized. Bladder: Appears normal for degree of bladder distention. Other: None. IMPRESSION: Unremarkable renal ultrasound. Electronically Signed   By: Ree Molt M.D.   On: 06/27/2024 10:24   DG Chest Port 1 View Result Date: 06/27/2024 EXAM: 1 VIEW(S) XRAY OF THE CHEST 06/27/2024 07:58:45 AM COMPARISON: 06/26/2024 CLINICAL HISTORY: Chest pain and pressure FINDINGS: LINES, TUBES AND DEVICES: Monitor wires noted. LUNGS AND PLEURA: No focal pulmonary opacity. No pulmonary edema. No pleural effusion. No pneumothorax. HEART AND MEDIASTINUM: No acute abnormality of the cardiac and mediastinal silhouettes. BONES AND SOFT TISSUES: Intact thoracic cage with thoracic spondylosis and mild  thoracic dextroscoliosis. IMPRESSION: 1. No acute cardiopulmonary abnormality. Electronically signed by: Waddell Calk MD 06/27/2024 08:14 AM EDT RP Workstation: [id number]   ECHOCARDIOGRAM COMPLETE Result Date: 06/26/2024    ECHOCARDIOGRAM REPORT   Patient Name:   [name] Date of Exam:  06/26/2024 Medical Rec #:  [id number]      Height:       63.0 in Accession #:    [id number]     Weight:       229.5 lb Date of Birth:  September 03, 1961      BSA:          2.050 m Patient Age:    63 years       BP:           123/91 mmHg Patient Gender: F              HR:           66 bpm. Exam Location:  [facility] Procedure: 2D Echo, Cardiac Doppler and Color Doppler (Both Spectral and Color            Flow Doppler were utilized during procedure). Indications:    CHF-Acute Diastolic I50.31  History:        Patient has prior history of Echocardiogram examinations, most                 recent 05/06/2024. Arrythmias:Atrial Fibrillation; Risk                 Factors:Hypertension and Diabetes.  Sonographer:    Jayson Gaskins Referring Phys: [id number] BRENDA MORRISON IMPRESSIONS  1. Left ventricular ejection fraction, by estimation, is 60 to 65%. The left ventricle has normal function. The left ventricle has no regional wall motion abnormalities. There is mild concentric left ventricular hypertrophy. Left ventricular diastolic parameters are indeterminate.  2. Right ventricular systolic function is normal. The right ventricular size is normal. There is normal pulmonary artery systolic pressure. The estimated right ventricular systolic pressure is 26.5 mmHg.  3. The mitral valve is grossly normal. Trivial mitral valve regurgitation.  4. The aortic valve is tricuspid. There is mild calcification of the aortic valve. Aortic valve regurgitation is not visualized. Aortic valve sclerosis is present, with no evidence of aortic valve stenosis. Aortic valve mean gradient measures 5.0 mmHg.  5. The inferior vena cava is normal in size with <50% respiratory variability, suggesting right atrial pressure of 8 mmHg. Comparison(s): Prior images reviewed side by side. LVEF 60-65%. Normal estimated RVSP. FINDINGS  Left Ventricle: Left ventricular ejection fraction, by estimation, is 60 to 65%. The left ventricle has normal function. The left  ventricle has no regional wall motion abnormalities. The left ventricular internal cavity size was normal in size. There is  mild concentric left ventricular hypertrophy. Left ventricular diastolic parameters are indeterminate. Right Ventricle: The right ventricular size is normal. No increase in right ventricular wall thickness. Right ventricular systolic function is normal. There is normal pulmonary artery systolic pressure. The tricuspid regurgitant velocity is 2.15 m/s, and  with an assumed right atrial pressure of 8 mmHg, the estimated right ventricular systolic pressure is 26.5 mmHg. Left Atrium: Left atrial size was normal in size. Right Atrium: Right atrial size was normal in size. Pericardium: There is no evidence of pericardial effusion. Presence of epicardial fat layer. Mitral Valve: The mitral valve is grossly normal. Trivial mitral valve regurgitation. Tricuspid Valve: The tricuspid valve is grossly normal. Tricuspid valve regurgitation is trivial. Aortic Valve: The aortic valve is tricuspid.  There is mild calcification of the aortic valve. Aortic valve regurgitation is not visualized. Aortic valve sclerosis is present, with no evidence of aortic valve stenosis. Aortic valve mean gradient measures 5.0 mmHg. Aortic valve peak gradient measures 7.8 mmHg. Aortic valve area, by VTI measures 2.09 cm. Pulmonic Valve: The pulmonic valve was not well visualized. Pulmonic valve regurgitation is trivial. Aorta: The aortic root is normal in size and structure. Venous: The inferior vena cava was not well visualized. The inferior vena cava is normal in size with less than 50% respiratory variability, suggesting right atrial pressure of 8 mmHg. IAS/Shunts: No atrial level shunt detected by color flow Doppler. Additional Comments: 3D was performed not requiring image post processing on an independent workstation and was indeterminate.  LEFT VENTRICLE PLAX 2D LVIDd:         4.60 cm   Diastology LVIDs:         3.00 cm    LV e' medial:    4.79 cm/s LV PW:         1.10 cm   LV E/e' medial:  18.4 LV IVS:        1.10 cm   LV e' lateral:   6.74 cm/s LVOT diam:     2.00 cm   LV E/e' lateral: 13.1 LV SV:         77 LV SV Index:   38 LVOT Area:     3.14 cm  RIGHT VENTRICLE RV S prime:     13.80 cm/s TAPSE (M-mode): 2.1 cm LEFT ATRIUM           Index        RIGHT ATRIUM           Index LA Vol (A2C): 66.4 ml 32.39 ml/m  RA Area:     14.90 cm LA Vol (A4C): 37.9 ml 18.49 ml/m  RA Volume:   38.70 ml  18.88 ml/m  AORTIC VALVE AV Area (Vmax):    2.18 cm AV Area (Vmean):   2.15 cm AV Area (VTI):     2.09 cm AV Vmax:           140.00 cm/s AV Vmean:          112.000 cm/s AV VTI:            0.370 m AV Peak Grad:      7.8 mmHg AV Mean Grad:      5.0 mmHg LVOT Vmax:         97.20 cm/s LVOT Vmean:        76.500 cm/s LVOT VTI:          0.246 m LVOT/AV VTI ratio: 0.66  AORTA Ao Root diam: 2.90 cm MITRAL VALVE               TRICUSPID VALVE MV Area (PHT): 2.39 cm    TR Peak grad:   18.5 mmHg MV Decel Time: 318 msec    TR Vmax:        215.00 cm/s MV E velocity: 88.10 cm/s MV A velocity: 84.30 cm/s  SHUNTS MV E/A ratio:  1.05        Systemic VTI:  0.25 m                            Systemic Diam: 2.00 cm Jayson Sierras MD Electronically signed by Jayson Sierras MD Signature Date/Time: 06/26/2024/3:25:41 PM    Final    CT ABDOMEN  PELVIS WO CONTRAST Result Date: 06/26/2024 CLINICAL DATA:  Chest pain EXAM: CT ABDOMEN AND PELVIS WITHOUT CONTRAST TECHNIQUE: Multidetector CT imaging of the abdomen and pelvis was performed following the standard protocol without IV contrast. RADIATION DOSE REDUCTION: This exam was performed according to the departmental dose-optimization program which includes automated exposure control, adjustment of the mA and/or kV according to patient size and/or use of iterative reconstruction technique. COMPARISON:  05/28/2024 FINDINGS: Lower chest: No acute abnormality. Hepatobiliary: No focal liver abnormality is seen. Status  post cholecystectomy. No biliary dilatation. Pancreas: Unremarkable. No pancreatic ductal dilatation or surrounding inflammatory changes. Spleen: Normal in size without focal abnormality. Adrenals/Urinary Tract: Adrenal glands are within normal limits. Kidneys are well visualized bilaterally. The bladder is decompressed. Punctate nonobstructing renal stones are noted in the lower poles bilaterally. Stomach/Bowel: No obstructive or inflammatory changes of the colon are noted. The appendix has been surgically removed. Small bowel and stomach are within normal limits. Vascular/Lymphatic: Aortic atherosclerosis. No enlarged abdominal or pelvic lymph nodes. Reproductive: Status post hysterectomy. No adnexal masses. Other: No abdominal wall hernia or abnormality. No abdominopelvic ascites. Musculoskeletal: No acute or significant osseous findings. IMPRESSION: Punctate nonobstructing renal calculi. No other focal abnormality is noted. Electronically Signed   By: Oneil Devonshire M.D.   On: 06/26/2024 02:40   DG Chest Portable 1 View Result Date: 06/26/2024 CLINICAL DATA:  Chest pain for several hours, initial encounter EXAM: PORTABLE CHEST 1 VIEW COMPARISON:  05/05/2024 FINDINGS: The heart size and mediastinal contours are within normal limits. Both lungs are clear. The visualized skeletal structures are unremarkable. IMPRESSION: No active disease. Electronically Signed   By: Oneil Devonshire M.D.   On: 06/26/2024 02:37    Assessment  63 y.o. female with a history of CAD, NSTEMI s/p DES in 2022, DVT, diabetes, HLD, HTN, GERD, CKD with possible recent progression to ESRD with likely need for dialysis in the future, gastroparesis, asthma, bipolar, A-fib on Eliquis , anemia of chronic disease with IDA requiring multiple blood transfusions and iron infusions over the last year who presented to the ED with reports of chest pain and left lower quadrant abdominal pain.  She was found to be acutely anemic with a hemoglobin less  than 7 and concern for occult GI bleed therefore GI consulted for further evaluation.  Acute on chronic anemia with history of transfusion dependent anemia: Baseline hemoglobin 7-8 range.  Initially 9.1 on admission and downtrending to 6.8 yesterday without any overt GI bleeding.  She does have evidence of notable IDA requiring transfusions and IV iron infusions recently.  EGD in March/April 2025 at outside hospital with reported gastric polyps considered as possible source for occult blood loss.  She did state that there was potential for small bowel AVMs that she was told may could bleed in the future.  She has been maintained on Eliquis  with her last dose received 10/12.  Endoscopy held off yesterday and ordered to allow for Eliquis  washout and also not optimized from a cardiac or nephrology standpoint.  She has been evaluated by cardiology who is not planning on any ischemic workup given she has not cardiac cath candidate.  Nephrology has no plans for immediate dialysis at this point and she has been receiving Lokelma for hyperkalemia.  Given her IDA she would likely benefit from hematology consultation outpatient for serial iron infusions and close monitoring.  Will defer consideration for ESA by nephrology.   Dr. Gonzella and discussed with anesthesia regarding the candidacy to pursue EGD today and they  have agreed that even though she does have some mild hypokalemia that we will proceed with EGD today for further evaluation.  LLQ abdominal pain: Presented with signs of left lower quadrant abdominal pain however CT showed no evidence of diverticulitis on noncontrast imaging.  Pain does not improve with bowel movements and denies any overt constipation.  Usually has 2-3 bowel movements daily at home without any diarrhea.  Has not had a bowel movement since admission however has been receiving narcotics.  Will need outpatient colonoscopy.  Prior enteritis: Noted on CT imaging at [facility] on  10/7.  None of this was appreciated on noncontrast imaging this admission.  Reports history of IBD and sister.  Given this as well as her anemia she needs outpatient ileocolonoscopy for further evaluation of the TI and the colon and potentially capsule study with [facility] for further workup of anemia.  Family history of cirrhosis She does have documented hepatic steatosis on prior imaging without thrombocytopenia or splenomegaly and currently has normal LFTs.  She does report history of liver disease and cirrhosis in her family.  Can consider further workup of fatty liver in the outpatient setting.  Plan / Recommendations  N.p.o. EGD today with Dr. Eartha Trend H/H, transfuse as needed Ongoing follow-up with nephrology and cardiology Likely needs outpatient hematology consultation Outpatient follow-up for evaluation of hepatic steatosis Outpatient ileocolonoscopy recommended after optimized outpatient from cardiac and renal standpoint.    LOS: 3 days    06/29/2024, 10:27 AM   Charmaine Melia, MSN, FNP-BC, AGACNP-BC [facility]

## 2024-06-30 ENCOUNTER — Inpatient Hospital Stay (HOSPITAL_COMMUNITY): Admitting: Anesthesiology

## 2024-06-30 ENCOUNTER — Encounter (HOSPITAL_COMMUNITY)
Admission: EM | Disposition: A | Discharge status: Home / Self Care | Payer: Self-pay | Source: Home / Self Care | Attending: Family Medicine

## 2024-06-30 ENCOUNTER — Encounter (HOSPITAL_COMMUNITY): Payer: Self-pay | Admitting: Acute Care

## 2024-06-30 DIAGNOSIS — R0789 Other chest pain: Secondary | ICD-10-CM | POA: Diagnosis not present

## 2024-06-30 DIAGNOSIS — K3189 Other diseases of stomach and duodenum: Secondary | ICD-10-CM | POA: Diagnosis not present

## 2024-06-30 DIAGNOSIS — K766 Portal hypertension: Secondary | ICD-10-CM | POA: Diagnosis not present

## 2024-06-30 DIAGNOSIS — I5032 Chronic diastolic (congestive) heart failure: Secondary | ICD-10-CM | POA: Diagnosis not present

## 2024-06-30 DIAGNOSIS — I110 Hypertensive heart disease with heart failure: Secondary | ICD-10-CM | POA: Diagnosis not present

## 2024-06-30 DIAGNOSIS — K317 Polyp of stomach and duodenum: Secondary | ICD-10-CM

## 2024-06-30 DIAGNOSIS — D500 Iron deficiency anemia secondary to blood loss (chronic): Secondary | ICD-10-CM

## 2024-06-30 DIAGNOSIS — N186 End stage renal disease: Secondary | ICD-10-CM

## 2024-06-30 DIAGNOSIS — K2950 Unspecified chronic gastritis without bleeding: Secondary | ICD-10-CM | POA: Diagnosis not present

## 2024-06-30 DIAGNOSIS — K31A11 Gastric intestinal metaplasia without dysplasia, involving the antrum: Secondary | ICD-10-CM

## 2024-06-30 DIAGNOSIS — I2510 Atherosclerotic heart disease of native coronary artery without angina pectoris: Secondary | ICD-10-CM | POA: Diagnosis not present

## 2024-06-30 DIAGNOSIS — N179 Acute kidney failure, unspecified: Secondary | ICD-10-CM | POA: Diagnosis not present

## 2024-06-30 DIAGNOSIS — N189 Chronic kidney disease, unspecified: Secondary | ICD-10-CM | POA: Diagnosis not present

## 2024-06-30 HISTORY — PX: ESOPHAGOGASTRODUODENOSCOPY: SHX5428

## 2024-06-30 LAB — GLUCOSE, CAPILLARY
Glucose-Capillary: 115 mg/dL — ABNORMAL HIGH (ref 70–99)
Glucose-Capillary: 120 mg/dL — ABNORMAL HIGH (ref 70–99)
Glucose-Capillary: 112 mg/dL — ABNORMAL HIGH (ref 70–99)
Glucose-Capillary: 131 mg/dL — ABNORMAL HIGH (ref 70–99)
Glucose-Capillary: 129 mg/dL — ABNORMAL HIGH (ref 70–99)
Glucose-Capillary: 223 mg/dL — ABNORMAL HIGH (ref 70–99)

## 2024-06-30 LAB — RENAL FUNCTION PANEL
Albumin: 3.3 g/dL — ABNORMAL LOW (ref 3.5–5.0)
Anion gap: 15 (ref 5–15)
BUN: 60 mg/dL — ABNORMAL HIGH (ref 8–23)
CO2: 21 mmol/L — ABNORMAL LOW (ref 22–32)
Calcium: 9 mg/dL (ref 8.9–10.3)
Chloride: 101 mmol/L (ref 98–111)
Creatinine, Ser: 6.97 mg/dL — ABNORMAL HIGH (ref 0.44–1.00)
GFR, Estimated: 6 mL/min — ABNORMAL LOW (ref >60)
Glucose, Bld: 111 mg/dL — ABNORMAL HIGH (ref 70–99)
Phosphorus: 6.7 mg/dL — ABNORMAL HIGH (ref 2.5–4.6)
Potassium: 4.4 mmol/L (ref 3.5–5.1)
Sodium: 137 mmol/L (ref 135–145)

## 2024-06-30 LAB — CBC
HCT: 22.8 % — ABNORMAL LOW (ref 36.0–46.0)
Hemoglobin: 7.5 g/dL — ABNORMAL LOW (ref 12.0–15.0)
MCH: 30.2 pg (ref 26.0–34.0)
MCHC: 32.9 g/dL (ref 30.0–36.0)
MCV: 91.9 fL (ref 80.0–100.0)
Platelets: 153 K/uL (ref 150–400)
RBC: 2.48 MIL/uL — ABNORMAL LOW (ref 3.87–5.11)
RDW: 16.7 % — ABNORMAL HIGH (ref 11.5–15.5)
WBC: 4.5 K/uL (ref 4.0–10.5)
nRBC: 0 % (ref 0.0–0.2)

## 2024-06-30 LAB — HEPATITIS B SURFACE ANTIGEN: Hepatitis B Surface Ag: NONREACTIVE

## 2024-06-30 LAB — PREPARE RBC (CROSSMATCH)

## 2024-06-30 SURGERY — EGD (ESOPHAGOGASTRODUODENOSCOPY)
Anesthesia: General

## 2024-06-30 MED ORDER — SODIUM CHLORIDE 0.9 % IV SOLN: INTRAVENOUS | Status: DC

## 2024-06-30 MED ORDER — SENNOSIDES-DOCUSATE SODIUM 8.6-50 MG PO TABS
1.0000 | ORAL_TABLET | Freq: Every evening | ORAL | Status: DC | PRN
  Administered 2024-06-30: 1 via ORAL
  Filled 2024-06-30: qty 1

## 2024-06-30 MED ORDER — CHLORHEXIDINE GLUCONATE CLOTH 2 % EX PADS
6.0000 | MEDICATED_PAD | Freq: Every day | CUTANEOUS | Status: DC
  Administered 2024-06-30 – 2024-07-04 (×4): 6 via TOPICAL

## 2024-06-30 MED ORDER — CHLORHEXIDINE GLUCONATE CLOTH 2 % EX PADS: 6.0000 | MEDICATED_PAD | Freq: Once | CUTANEOUS | Status: AC

## 2024-06-30 MED ORDER — GLUCAGON HCL RDNA (DIAGNOSTIC) 1 MG IJ SOLR: 1.0000 mg | INTRAMUSCULAR | Status: DC | PRN

## 2024-06-30 MED ORDER — LIDOCAINE 2% (20 MG/ML) 5 ML SYRINGE
INTRAMUSCULAR | Status: DC | PRN
  Administered 2024-06-30: 100 mg via INTRAVENOUS

## 2024-06-30 MED ORDER — SODIUM CHLORIDE 0.9% IV SOLUTION: Freq: Once | INTRAVENOUS | Status: AC

## 2024-06-30 MED ORDER — RENA-VITE PO TABS
1.0000 | ORAL_TABLET | Freq: Every day | ORAL | Status: DC
  Administered 2024-07-01 – 2024-07-03 (×3): 1 via ORAL
  Filled 2024-06-30 (×3): qty 1

## 2024-06-30 MED ORDER — ACETAMINOPHEN 325 MG PO TABS
650.0000 mg | ORAL_TABLET | Freq: Four times a day (QID) | ORAL | Status: DC | PRN
  Administered 2024-07-02: 650 mg via ORAL
  Filled 2024-06-30: qty 2

## 2024-06-30 MED ORDER — CEFAZOLIN SODIUM-DEXTROSE 2-4 GM/100ML-% IV SOLN
2.0000 g | INTRAVENOUS | Status: AC
Start: 2024-07-01 — End: 2024-07-02
  Administered 2024-07-01: 2 g via INTRAVENOUS
  Filled 2024-06-30: qty 100

## 2024-06-30 MED ORDER — STERILE WATER FOR IRRIGATION IR SOLN
Status: DC | PRN
  Administered 2024-06-30: 60 mL

## 2024-06-30 MED ORDER — CHLORHEXIDINE GLUCONATE CLOTH 2 % EX PADS
6.0000 | MEDICATED_PAD | Freq: Once | CUTANEOUS | Status: AC
  Administered 2024-06-30: 6 via TOPICAL

## 2024-06-30 MED ORDER — HYDRALAZINE HCL 20 MG/ML IJ SOLN: 10.0000 mg | INTRAMUSCULAR | Status: DC | PRN

## 2024-06-30 MED ORDER — LACTATED RINGERS IV SOLN: INTRAVENOUS | Status: DC

## 2024-06-30 MED ORDER — IPRATROPIUM-ALBUTEROL 0.5-2.5 (3) MG/3ML IN SOLN: 3.0000 mL | RESPIRATORY_TRACT | Status: DC | PRN

## 2024-06-30 MED ORDER — NEPRO/CARBSTEADY PO LIQD
237.0000 mL | Freq: Two times a day (BID) | ORAL | Status: DC
Start: 2024-07-01 — End: 2024-07-04

## 2024-06-30 MED ORDER — PROPOFOL 10 MG/ML IV BOLUS
INTRAVENOUS | Status: DC | PRN
  Administered 2024-06-30: 100 mg via INTRAVENOUS
  Administered 2024-06-30 (×2): 50 mg via INTRAVENOUS

## 2024-06-30 NOTE — Anesthesia Preprocedure Evaluation (Signed)
 Anesthesia Evaluation  Patient identified by MRN, date of birth, ID band Patient awake    Reviewed: Allergy & Precautions, H&P , NPO status , Patient's Chart, lab work & pertinent test results, reviewed documented beta blocker date and time   Airway Mallampati: II  TM Distance: >3 FB Neck ROM: full    Dental no notable dental hx.    Pulmonary asthma    Pulmonary exam normal breath sounds clear to auscultation       Cardiovascular Exercise Tolerance: Good hypertension, + angina  + CAD and +CHF   Rhythm:regular Rate:Normal     Neuro/Psych  Headaches PSYCHIATRIC DISORDERS Anxiety Depression Bipolar Disorder      GI/Hepatic Neg liver ROS,GERD  ,,  Endo/Other  diabetes    Renal/GU Renal disease  negative genitourinary   Musculoskeletal   Abdominal   Peds  Hematology  (+) Blood dyscrasia, anemia   Anesthesia Other Findings   Reproductive/Obstetrics negative OB ROS                              Anesthesia Physical Anesthesia Plan  ASA: 3 and emergent  Anesthesia Plan: General   Post-op Pain Management:    Induction:   PONV Risk Score and Plan: Propofol infusion  Airway Management Planned:   Additional Equipment:   Intra-op Plan:   Post-operative Plan:   Informed Consent: I have reviewed the patients History and Physical, chart, labs and discussed the procedure including the risks, benefits and alternatives for the proposed anesthesia with the patient or authorized representative who has indicated his/her understanding and acceptance.     Dental Advisory Given  Plan Discussed with: CRNA  Anesthesia Plan Comments:          Anesthesia Quick Evaluation

## 2024-06-30 NOTE — Progress Notes (Signed)
 Rounding Note   Patient Name: [name] Date of Encounter: 06/30/2024  [facility] Cardiologist: Vishnu P Mallipeddi, MD   Subjective Nonspecific chest pain at times.   Scheduled Meds:  sodium chloride    Intravenous Once   atorvastatin   40 mg Oral q morning   carvedilol   6.25 mg Oral BID WC   clopidogrel   75 mg Oral Daily   darbepoetin (ARANESP ) injection - DIALYSIS  100 mcg Subcutaneous Q Mon-1800   famotidine   20 mg Oral Daily   insulin  aspart  0-9 Units Subcutaneous TID AC & HS   isosorbide  mononitrate  90 mg Oral q morning   lamoTRIgine   100 mg Oral BID   pantoprazole   40 mg Oral BID AC   sertraline   100 mg Oral Daily   Continuous Infusions:  PRN Meds: clonazePAM , fentaNYL  (SUBLIMAZE ) injection, mouth rinse   Vital Signs  Vitals:   06/29/24 1215 06/29/24 1245 06/29/24 2017 06/30/24 0432  BP: (!) 131/99 (!) 185/80 (!) 139/56 139/66  Pulse: 71 71    Resp: 18 18    Temp:  97.8 F (36.6 C) 98.2 F (36.8 C) 98.2 F (36.8 C)  TempSrc:  Oral Oral Oral  SpO2: 97% 99% 94% 98%  Weight:      Height:        Intake/Output Summary (Last 24 hours) at 06/30/2024 0859 Last data filed at 06/30/2024 0432 Gross per 24 hour  Intake 200 ml  Output 1200 ml  Net -1000 ml      06/29/2024   10:57 AM 06/28/2024    6:34 AM 06/25/2024    9:29 PM  Last 3 Weights  Weight (lbs) 221 lb 9 oz 221 lb 9 oz 229 lb 8 oz  Weight (kg) 100.5 kg 100.5 kg 104.1 kg      Telemetry NSR - Personally Reviewed  ECG  N/a - Personally Reviewed  Physical Exam  GEN: No acute distress.   Neck: No JVD Cardiac: RRR, no murmurs, rubs, or gallops.  Respiratory: Clear to auscultation bilaterally. GI: Soft, nontender, non-distended  MS: No edema; No deformity. Neuro:  Nonfocal  Psych: Normal affect   Labs High Sensitivity Troponin:  No results for input(s): TROPONINIHS in the last 720 hours.   Chemistry Recent Labs  Lab 06/25/24 2329 06/27/24 0426 06/28/24 0246  06/29/24 0502 06/30/24 0512  NA 133*   < > 137 135 137  K 4.8   < > 5.3* 5.3* 4.4  CL 99   < > 102 101 101  CO2 22   < > 21* 21* 21*  GLUCOSE 238*   < > 165* 159* 111*  BUN 31*   < > 51* 59* 60*  CREATININE 3.40*   < > 6.89* 6.89* 6.97*  CALCIUM  9.5   < > 8.7* 8.8* 9.0  MG 1.9  --   --   --   --   PROT 9.5*  --   --   --   --   ALBUMIN  4.1  --  3.3* 3.3* 3.3*  AST 10*  --   --   --   --   ALT 11  --   --   --   --   ALKPHOS 82  --   --   --   --   BILITOT 0.5  --   --   --   --   GFRNONAA 15*   < > 6* 6* 6*  ANIONGAP 12   < > 14 13  15   < > = values in this interval not displayed.    Lipids No results for input(s): CHOL, TRIG, HDL, LABVLDL, LDLCALC, CHOLHDL in the last 168 hours.  Hematology Recent Labs  Lab 06/28/24 0414 06/29/24 0502 06/30/24 0512  WBC 6.2 5.5 4.5  RBC 2.21* 2.54* 2.48*  HGB 6.8* 7.7* 7.5*  HCT 20.7* 23.2* 22.8*  MCV 93.7 91.3 91.9  MCH 30.8 30.3 30.2  MCHC 32.9 33.2 32.9  RDW 16.3* 16.8* 16.7*  PLT 152 135* 153   Thyroid No results for input(s): TSH, FREET4 in the last 168 hours.  BNP Recent Labs  Lab 06/25/24 2329  PROBNP 1,087.0*    DDimer No results for input(s): DDIMER in the last 168 hours.   Radiology  No results found.    Patient Profile   [name] is a 63 y.o. female with a hx of AD with prior PCI to RCA in 2022 in [place], [place] . Chronic HFpreF, HTN, HLD, DM2, prior DVT on eliquis , PAF, anemia of chronic disease, CKD IV who is being seen 06/28/2024 for the evaluation of chest pain at the request of Dr Vicci.   Assessment & Plan  1.Chest pain/CAD - PCI to RCA in 2022 in [place], [place]  - long history of chest pain - no objective evidence of ischemia by EKG or enzymes. Mild flat trop in setting of advanced CKD, HFpEF  not consistent with ACS.  - 06/2024 echo normal LVEF, no WMAs - 04/2024 nuclear stress limited by report but no clear ischemia, possible mild apical ischemia though affected by artifact.  Severe gut radiotracer uptake could not assess inferior wall in study   - current presentation complicated by Hgb down to 6.8 being worked up for GI bleed, AKI on CKD Cr up to 6.89 with a GFR of 6. In general she is not a cath candidate. Follow symptoms with adjustements of medical therapy, management of her anemia and volume status.  - we increased imdur  to 90mg .  - would look to keep Hgb above 8. F/u EGD results, perhaps GI etiology for her symptoms.  - medical therapy with atorvastaitn 40, coreg  6.25mg  bid, plavix  75mg , imdur  60mg . Has been on plavix  since prior stent, unclear to me the long term plans for use while also on eliquis , I don't see that she has followed with cardiology as outpatient.     2.PAF - hold eliquis  with drop in Hgb     3. AKI on CKD - per nephrology, Cr up to 6.89        For questions or updates, please contact [facility] Please consult [email or web address] for contact info under       Signed, Alvan Carrier, MD  06/30/2024, 8:59 AM

## 2024-06-30 NOTE — Consult Note (Signed)
 [facility] Consult  Reason for Consult: End stage renal disease, needing to start dialysis  Referring Physician: Dr. Jerrye, MD Nephrology   Chief Complaint   Chest Pain     HPI: [name] is a 63 y.o. female with worsening renal disease. She has never had a catheter before. She wants to start dialysis. Dr. Jerrye called to get a catheter placed. She has been on plavix . She has been off Eliquis . She reports she is getting blood today. She had an EGD for concern for bleeding. No bleeding reported.   Past Medical History:  Diagnosis Date   Anxiety    Asthma    Atrial fibrillation (HCC)    Bipolar 1 disorder (HCC)    Chronic back pain    Chronic chest pain    Diabetes mellitus without complication (HCC)    DVT (deep venous thrombosis) (HCC) 2017   Gastroparesis    GERD (gastroesophageal reflux disease)    GI bleed 11/2023   required 2 units PRBC transfusion   Hyperlipemia    Hypertension    IBS (irritable bowel syndrome)    Normal cardiac stress test 02/14/2014   [facility]   Panic attacks     Past Surgical History:  Procedure Laterality Date   ABDOMINAL HYSTERECTOMY  2001   APPENDECTOMY  10/2005   CHOLECYSTECTOMY  10/2005   COLONOSCOPY  2007   [facility] ([name]pt reports hemorrhoids   ESOPHAGOGASTRODUODENOSCOPY  02/28/2006   [name], normal on daily PPI, myultiple hyperplastic polyps   MOUTH SURGERY     RIGHT HEART CATH N/A 04/25/2023   Procedure: RIGHT HEART CATH;  Surgeon: Verlin Lonni BIRCH, MD;  Location: [facility];  Service: Cardiovascular;  Laterality: N/A;   RIGHT OOPHORECTOMY  1999    Family History  Problem Relation Age of Onset   Cirrhosis Mother 76       ?etiology   Cirrhosis Father        ?etiology   Cirrhosis Sister 108       ?etiology   Diverticulitis Sister     Social History   Tobacco Use   Smoking status: Passive Smoke Exposure - Never Smoker   Smokeless tobacco: Never  Vaping Use   Vaping  status: Never Used  Substance Use Topics   Alcohol use: No   Drug use: No    Medications: I have reviewed the patient's current medications. Prior to Admission:  Medications Prior to Admission  Medication Sig Dispense Refill Last Dose/Taking   albuterol  (VENTOLIN  HFA) 108 (90 Base) MCG/ACT inhaler Inhale 2 puffs into the lungs every 6 (six) hours as needed for wheezing or shortness of breath.   06/26/2024 Morning   apixaban  (ELIQUIS ) 5 MG TABS tablet Take 1 tablet (5 mg total) by mouth 2 (two) times daily. 60 tablet 0 06/25/2024   atorvastatin  (LIPITOR) 40 MG tablet Take 40 mg by mouth every morning.   06/25/2024   carvedilol  (COREG ) 25 MG tablet Take 0.5 tablets (12.5 mg total) by mouth 2 (two) times daily with a meal. (Patient taking differently: Take 12.5 mg by mouth daily with breakfast. Patient takes whole pill 25 mg BID) 30 tablet 0 06/29/2024   clonazePAM  (KLONOPIN ) 0.5 MG tablet Take 1 tablet (0.5 mg total) by mouth 2 (two) times daily as needed for anxiety. 15 tablet 0 Past Month   clopidogrel  (PLAVIX ) 75 MG tablet Take 75 mg by mouth daily.   06/29/2024   ergocalciferol (VITAMIN D2) 1.25 MG (50000 UT) capsule Take  50,000 Units by mouth every 30 (thirty) days.   Past Month   famotidine  (PEPCID ) 20 MG tablet Take 20 mg by mouth 2 (two) times daily.   06/29/2024   gabapentin  (NEURONTIN ) 300 MG capsule Take 300 mg by mouth 3 (three) times daily. (Patient taking differently: Take 300 mg by mouth 3 (three) times daily. Reduced to BID by MD)   06/25/2024   hydrOXYzine  (ATARAX ) 25 MG tablet Take 25 mg by mouth every 12 (twelve) hours as needed for anxiety.   Taking As Needed   isosorbide  mononitrate (IMDUR ) 60 MG 24 hr tablet Take 1 tablet (60 mg total) by mouth every morning. 30 tablet 1 06/29/2024   lamoTRIgine  (LAMICTAL ) 100 MG tablet Take 100 mg by mouth 2 (two) times daily.   06/29/2024   LANTUS  SOLOSTAR 100 UNIT/ML Solostar Pen Inject 50 Units into the skin 2 (two) times daily.    06/25/2024   NOVOLOG  FLEXPEN 100 UNIT/ML FlexPen Inject 15 Units into the skin 4 (four) times daily. 4-5 times daily  2 06/25/2024   ondansetron  (ZOFRAN ) 4 MG tablet Take 1 tablet (4 mg total) by mouth every 6 (six) hours as needed for nausea or vomiting. 20 tablet 0 06/25/2024   ondansetron  (ZOFRAN -ODT) 4 MG disintegrating tablet Take 2 mg by mouth every 6 (six) hours as needed.   Unknown   pantoprazole  (PROTONIX ) 40 MG tablet Take 40 mg by mouth 2 (two) times daily before a meal.   06/25/2024   promethazine  (PHENERGAN ) 25 MG tablet Take 25 mg by mouth every 6 (six) hours as needed for nausea or vomiting.   Unknown   sertraline  (ZOLOFT ) 100 MG tablet Take 100 mg by mouth daily.   06/29/2024   SYMBICORT 160-4.5 MCG/ACT inhaler Inhale 2 puffs into the lungs daily at 6 (six) AM. (Patient not taking: Reported on 06/26/2024)   Not Taking   Scheduled:  sodium chloride    Intravenous Once   atorvastatin   40 mg Oral q morning   carvedilol   6.25 mg Oral BID WC   Chlorhexidine Gluconate Cloth  6 each Topical Q0600   clopidogrel   75 mg Oral Daily   darbepoetin (ARANESP ) injection - DIALYSIS  100 mcg Subcutaneous Q Mon-1800   famotidine   20 mg Oral Daily   [START ON 07/01/2024] feeding supplement (NEPRO CARB STEADY)  237 mL Oral BID BM   insulin  aspart  0-9 Units Subcutaneous TID AC & HS   isosorbide  mononitrate  90 mg Oral q morning   lamoTRIgine   100 mg Oral BID   [START ON 07/01/2024] multivitamin  1 tablet Oral QHS   pantoprazole   40 mg Oral BID AC   sertraline   100 mg Oral Daily   Continuous: PRN:acetaminophen , clonazePAM , fentaNYL  (SUBLIMAZE ) injection, glucagon (human recombinant), hydrALAZINE , ipratropium-albuterol , mouth rinse, senna-docusate  Allergies  Allergen Reactions   Gelatin Swelling    No jello of any kind   Iodinated Contrast Media Anaphylaxis, Hives, Itching and Swelling   Methylprednisolone  Sodium Succ Shortness Of Breath   Dicyclomine Hcl Anxiety and Other (See Comments)     shaky and sick GI upset, sick to stomach   Doxycycline Hives and Rash    rash   Glutamic Acid Itching and Swelling   Ketorolac Hives and Rash    Tolerated Toradol in the ED without side effect, hives, or complaint on 02/17/2022   Lisinopril  Swelling    Angioedema (facial, lip, or tongue swelling)   Metformin Nausea And Vomiting and Other (See Comments)    Dyspepsia  sick to stomach It made me sick where I couldn't get out of the bed   Nitroglycerin  Nausea And Vomiting and Swelling    Pt states it makes her too sick   Nsaids Other (See Comments)    Stomach pain/bleeding   Other Itching, Swelling and Other (See Comments)    Solu-Medrol  Mix-O-Vial   Quetiapine Other (See Comments)   Vancomycin     Itching and erythema at infusion site within a few minutes of starting infusion. No systemic evidence of Red Man Syndrome   Blueberry Flavoring Agent (Non-Screening) Nausea And Vomiting and Rash   Cephalexin  Nausea And Vomiting and Nausea Only    sick to stomach   Fish Allergy Rash   Ibuprofen Nausea And Vomiting    GI upset Reports stomach upset 2/2 hx gastric polyps. Not true allergy.   Nitrofuran Derivatives Itching and Nausea And Vomiting    sick to stomach   Shellfish Allergy Itching, Swelling and Rash   Strawberry Flavoring Agent (Non-Screening) Nausea And Vomiting and Rash   Sulfa Antibiotics Nausea And Vomiting and Other (See Comments)    It just makes me real sick     ROS:  A comprehensive review of systems was negative except for: Genitourinary: positive for worsening renal failure  Hematologic/lymphatic: positive for bleeding  Blood pressure (!) 146/64, pulse 69, temperature 97.7 F (36.5 C), temperature source Oral, resp. rate 16, height 5' 4 (1.626 m), weight 100.5 kg, SpO2 97%. Physical Exam Vitals reviewed.  HENT:     Head: Normocephalic.  Cardiovascular:     Rate and Rhythm: Normal rate.  Pulmonary:     Effort: Pulmonary effort is normal.   Abdominal:     Palpations: Abdomen is soft.  Musculoskeletal:        General: Normal range of motion.     Cervical back: Normal range of motion.  Skin:    General: Skin is warm.  Neurological:     General: No focal deficit present.     Mental Status: She is alert and oriented to person, place, and time.     Results: Results for orders placed or performed during the hospital encounter of 06/25/24 (from the past 48 hours)  Glucose, capillary     Status: Abnormal   Collection Time: 06/28/24  7:33 PM  Result Value Ref Range   Glucose-Capillary 182 (H) 70 - 99 mg/dL    Comment: Glucose reference range applies only to samples taken after fasting for at least 8 hours.  Renal function panel     Status: Abnormal   Collection Time: 06/29/24  5:02 AM  Result Value Ref Range   Sodium 135 135 - 145 mmol/L   Potassium 5.3 (H) 3.5 - 5.1 mmol/L   Chloride 101 98 - 111 mmol/L   CO2 21 (L) 22 - 32 mmol/L   Glucose, Bld 159 (H) 70 - 99 mg/dL    Comment: Glucose reference range applies only to samples taken after fasting for at least 8 hours.   BUN 59 (H) 8 - 23 mg/dL   Creatinine, Ser 3.10 (H) 0.44 - 1.00 mg/dL   Calcium  8.8 (L) 8.9 - 10.3 mg/dL   Phosphorus 6.2 (H) 2.5 - 4.6 mg/dL   Albumin  3.3 (L) 3.5 - 5.0 g/dL   GFR, Estimated 6 (L) >60 mL/min    Comment: (NOTE) Calculated using the CKD-EPI Creatinine Equation (2021)    Anion gap 13 5 - 15    Comment: Performed at [facility], [address] [place], [place] [address]  CBC     Status: Abnormal   Collection Time: 06/29/24  5:02 AM  Result Value Ref Range   WBC 5.5 4.0 - 10.5 K/uL   RBC 2.54 (L) 3.87 - 5.11 MIL/uL   Hemoglobin 7.7 (L) 12.0 - 15.0 g/dL   HCT 76.7 (L) 63.9 - 53.9 %   MCV 91.3 80.0 - 100.0 fL   MCH 30.3 26.0 - 34.0 pg   MCHC 33.2 30.0 - 36.0 g/dL   RDW 83.1 (H) 88.4 - 84.4 %   Platelets 135 (L) 150 - 400 K/uL   nRBC 0.0 0.0 - 0.2 %    Comment: Performed at [facility], [address], [place], [place] [address]   Glucose, capillary     Status: Abnormal   Collection Time: 06/29/24  7:17 AM  Result Value Ref Range   Glucose-Capillary 143 (H) 70 - 99 mg/dL    Comment: Glucose reference range applies only to samples taken after fasting for at least 8 hours.  Glucose, capillary     Status: Abnormal   Collection Time: 06/29/24 10:51 AM  Result Value Ref Range   Glucose-Capillary 161 (H) 70 - 99 mg/dL    Comment: Glucose reference range applies only to samples taken after fasting for at least 8 hours.  Glucose, capillary     Status: Abnormal   Collection Time: 06/29/24  4:24 PM  Result Value Ref Range   Glucose-Capillary 160 (H) 70 - 99 mg/dL    Comment: Glucose reference range applies only to samples taken after fasting for at least 8 hours.  Glucose, capillary     Status: Abnormal   Collection Time: 06/29/24 10:27 PM  Result Value Ref Range   Glucose-Capillary 133 (H) 70 - 99 mg/dL    Comment: Glucose reference range applies only to samples taken after fasting for at least 8 hours.  Renal function panel     Status: Abnormal   Collection Time: 06/30/24  5:12 AM  Result Value Ref Range   Sodium 137 135 - 145 mmol/L   Potassium 4.4 3.5 - 5.1 mmol/L   Chloride 101 98 - 111 mmol/L   CO2 21 (L) 22 - 32 mmol/L   Glucose, Bld 111 (H) 70 - 99 mg/dL    Comment: Glucose reference range applies only to samples taken after fasting for at least 8 hours.   BUN 60 (H) 8 - 23 mg/dL   Creatinine, Ser 3.02 (H) 0.44 - 1.00 mg/dL   Calcium  9.0 8.9 - 10.3 mg/dL   Phosphorus 6.7 (H) 2.5 - 4.6 mg/dL   Albumin  3.3 (L) 3.5 - 5.0 g/dL   GFR, Estimated 6 (L) >60 mL/min    Comment: (NOTE) Calculated using the CKD-EPI Creatinine Equation (2021)    Anion gap 15 5 - 15    Comment: Performed at [facility], [address] [place], [place] [address]  CBC     Status: Abnormal   Collection Time: 06/30/24  5:12 AM  Result Value Ref Range   WBC 4.5 4.0 - 10.5 K/uL   RBC 2.48 (L) 3.87 - 5.11 MIL/uL   Hemoglobin 7.5 (L)  12.0 - 15.0 g/dL   HCT 77.1 (L) 63.9 - 53.9 %   MCV 91.9 80.0 - 100.0 fL   MCH 30.2 26.0 - 34.0 pg   MCHC 32.9 30.0 - 36.0 g/dL   RDW 83.2 (H) 88.4 - 84.4 %   Platelets 153 150 - 400 K/uL   nRBC 0.0  0.0 - 0.2 %    Comment: Performed at [facility], [address], [place], [place] [address]  Glucose, capillary     Status: Abnormal   Collection Time: 06/30/24  7:30 AM  Result Value Ref Range   Glucose-Capillary 115 (H) 70 - 99 mg/dL    Comment: Glucose reference range applies only to samples taken after fasting for at least 8 hours.  Prepare RBC (crossmatch)     Status: None   Collection Time: 06/30/24  9:12 AM  Result Value Ref Range   Order Confirmation      ORDER PROCESSED BY BLOOD BANK Performed at [facility], [address] [place], [place] [address]   Glucose, capillary     Status: Abnormal   Collection Time: 06/30/24 10:52 AM  Result Value Ref Range   Glucose-Capillary 120 (H) 70 - 99 mg/dL    Comment: Glucose reference range applies only to samples taken after fasting for at least 8 hours.  Glucose, capillary     Status: Abnormal   Collection Time: 06/30/24  1:35 PM  Result Value Ref Range   Glucose-Capillary 112 (H) 70 - 99 mg/dL    Comment: Glucose reference range applies only to samples taken after fasting for at least 8 hours.  Glucose, capillary     Status: Abnormal   Collection Time: 06/30/24  3:28 PM  Result Value Ref Range   Glucose-Capillary 131 (H) 70 - 99 mg/dL    Comment: Glucose reference range applies only to samples taken after fasting for at least 8 hours.  Glucose, capillary     Status: Abnormal   Collection Time: 06/30/24  4:07 PM  Result Value Ref Range   Glucose-Capillary 129 (H) 70 - 99 mg/dL    Comment: Glucose reference range applies only to samples taken after fasting for at least 8 hours.   None   Assessment & Plan:  Kindal Ponti is a 63 y.o. female with worsening renal failure needing to start dialysis. Discussed tunneled catheter and  risk of bleeding, infection, injury to vessels, pneumothorax, inability to place. Discussed fluoroscopy and US  use.  -OR tomorrow around 10AM    All questions were answered to the satisfaction of the patient.    [name] 06/30/2024, 5:02 PM

## 2024-06-30 NOTE — H&P (View-Only) (Signed)
 [facility] Consult  Reason for Consult: End stage renal disease, needing to start dialysis  Referring Physician: Dr. Jerrye, MD Nephrology   Chief Complaint   Chest Pain     HPI: [name] is a 63 y.o. female with worsening renal disease. She has never had a catheter before. She wants to start dialysis. Dr. Jerrye called to get a catheter placed. She has been on plavix . She has been off Eliquis . She reports she is getting blood today. She had an EGD for concern for bleeding. No bleeding reported.   Past Medical History:  Diagnosis Date   Anxiety    Asthma    Atrial fibrillation (HCC)    Bipolar 1 disorder (HCC)    Chronic back pain    Chronic chest pain    Diabetes mellitus without complication (HCC)    DVT (deep venous thrombosis) (HCC) 2017   Gastroparesis    GERD (gastroesophageal reflux disease)    GI bleed 11/2023   required 2 units PRBC transfusion   Hyperlipemia    Hypertension    IBS (irritable bowel syndrome)    Normal cardiac stress test 02/14/2014   [facility]   Panic attacks     Past Surgical History:  Procedure Laterality Date   ABDOMINAL HYSTERECTOMY  2001   APPENDECTOMY  10/2005   CHOLECYSTECTOMY  10/2005   COLONOSCOPY  2007   [facility] ([name])-pt reports hemorrhoids   ESOPHAGOGASTRODUODENOSCOPY  02/28/2006   [name], normal on daily PPI, myultiple hyperplastic polyps   MOUTH SURGERY     RIGHT HEART CATH N/A 04/25/2023   Procedure: RIGHT HEART CATH;  Surgeon: Verlin Lonni BIRCH, MD;  Location: [facility] INVASIVE [facility];  Service: Cardiovascular;  Laterality: N/A;   RIGHT OOPHORECTOMY  1999    Family History  Problem Relation Age of Onset   Cirrhosis Mother 76       ?etiology   Cirrhosis Father        ?etiology   Cirrhosis Sister 108       ?etiology   Diverticulitis Sister     Social History   Tobacco Use   Smoking status: Passive Smoke Exposure - Never Smoker   Smokeless tobacco: Never  Vaping Use   Vaping  status: Never Used  Substance Use Topics   Alcohol use: No   Drug use: No    Medications: I have reviewed the patient's current medications. Prior to Admission:  Medications Prior to Admission  Medication Sig Dispense Refill Last Dose/Taking   albuterol  (VENTOLIN  HFA) 108 (90 Base) MCG/ACT inhaler Inhale 2 puffs into the lungs every 6 (six) hours as needed for wheezing or shortness of breath.   06/26/2024 Morning   apixaban  (ELIQUIS ) 5 MG TABS tablet Take 1 tablet (5 mg total) by mouth 2 (two) times daily. 60 tablet 0 06/25/2024   atorvastatin  (LIPITOR) 40 MG tablet Take 40 mg by mouth every morning.   06/25/2024   carvedilol  (COREG ) 25 MG tablet Take 0.5 tablets (12.5 mg total) by mouth 2 (two) times daily with a meal. (Patient taking differently: Take 12.5 mg by mouth daily with breakfast. Patient takes whole pill 25 mg BID) 30 tablet 0 06/29/2024   clonazePAM  (KLONOPIN ) 0.5 MG tablet Take 1 tablet (0.5 mg total) by mouth 2 (two) times daily as needed for anxiety. 15 tablet 0 Past Month   clopidogrel  (PLAVIX ) 75 MG tablet Take 75 mg by mouth daily.   06/29/2024   ergocalciferol (VITAMIN D2) 1.25 MG (50000 UT) capsule Take  50,000 Units by mouth every 30 (thirty) days.   Past Month   famotidine  (PEPCID ) 20 MG tablet Take 20 mg by mouth 2 (two) times daily.   06/29/2024   gabapentin  (NEURONTIN ) 300 MG capsule Take 300 mg by mouth 3 (three) times daily. (Patient taking differently: Take 300 mg by mouth 3 (three) times daily. Reduced to BID by MD)   06/25/2024   hydrOXYzine  (ATARAX ) 25 MG tablet Take 25 mg by mouth every 12 (twelve) hours as needed for anxiety.   Taking As Needed   isosorbide  mononitrate (IMDUR ) 60 MG 24 hr tablet Take 1 tablet (60 mg total) by mouth every morning. 30 tablet 1 06/29/2024   lamoTRIgine  (LAMICTAL ) 100 MG tablet Take 100 mg by mouth 2 (two) times daily.   06/29/2024   LANTUS  SOLOSTAR 100 UNIT/ML Solostar Pen Inject 50 Units into the skin 2 (two) times daily.    06/25/2024   NOVOLOG  FLEXPEN 100 UNIT/ML FlexPen Inject 15 Units into the skin 4 (four) times daily. 4-5 times daily  2 06/25/2024   ondansetron  (ZOFRAN ) 4 MG tablet Take 1 tablet (4 mg total) by mouth every 6 (six) hours as needed for nausea or vomiting. 20 tablet 0 06/25/2024   ondansetron  (ZOFRAN -ODT) 4 MG disintegrating tablet Take 2 mg by mouth every 6 (six) hours as needed.   Unknown   pantoprazole  (PROTONIX ) 40 MG tablet Take 40 mg by mouth 2 (two) times daily before a meal.   06/25/2024   promethazine  (PHENERGAN ) 25 MG tablet Take 25 mg by mouth every 6 (six) hours as needed for nausea or vomiting.   Unknown   sertraline  (ZOLOFT ) 100 MG tablet Take 100 mg by mouth daily.   06/29/2024   SYMBICORT 160-4.5 MCG/ACT inhaler Inhale 2 puffs into the lungs daily at 6 (six) AM. (Patient not taking: Reported on 06/26/2024)   Not Taking   Scheduled:  sodium chloride    Intravenous Once   atorvastatin   40 mg Oral q morning   carvedilol   6.25 mg Oral BID WC   Chlorhexidine Gluconate Cloth  6 each Topical Q0600   clopidogrel   75 mg Oral Daily   darbepoetin (ARANESP ) injection - DIALYSIS  100 mcg Subcutaneous Q Mon-1800   famotidine   20 mg Oral Daily   [START ON 07/01/2024] feeding supplement (NEPRO CARB STEADY)  237 mL Oral BID BM   insulin  aspart  0-9 Units Subcutaneous TID AC & HS   isosorbide  mononitrate  90 mg Oral q morning   lamoTRIgine   100 mg Oral BID   [START ON 07/01/2024] multivitamin  1 tablet Oral QHS   pantoprazole   40 mg Oral BID AC   sertraline   100 mg Oral Daily   Continuous: PRN:acetaminophen , clonazePAM , fentaNYL  (SUBLIMAZE ) injection, glucagon (human recombinant), hydrALAZINE , ipratropium-albuterol , mouth rinse, senna-docusate  Allergies  Allergen Reactions   Gelatin Swelling    No jello of any kind   Iodinated Contrast Media Anaphylaxis, Hives, Itching and Swelling   Methylprednisolone  Sodium Succ Shortness Of Breath   Dicyclomine Hcl Anxiety and Other (See Comments)     shaky and sick GI upset, sick to stomach   Doxycycline Hives and Rash    rash   Glutamic Acid Itching and Swelling   Ketorolac Hives and Rash    Tolerated Toradol in the ED without side effect, hives, or complaint on 02/17/2022   Lisinopril  Swelling    Angioedema (facial, lip, or tongue swelling)   Metformin Nausea And Vomiting and Other (See Comments)    Dyspepsia  sick to stomach It made me sick where I couldn't get out of the bed   Nitroglycerin  Nausea And Vomiting and Swelling    Pt states it makes her too sick   Nsaids Other (See Comments)    Stomach pain/bleeding   Other Itching, Swelling and Other (See Comments)    Solu-Medrol  Mix-O-Vial   Quetiapine Other (See Comments)   Vancomycin     Itching and erythema at infusion site within a few minutes of starting infusion. No systemic evidence of Red Man Syndrome   Blueberry Flavoring Agent (Non-Screening) Nausea And Vomiting and Rash   Cephalexin  Nausea And Vomiting and Nausea Only    sick to stomach   Fish Allergy Rash   Ibuprofen Nausea And Vomiting    GI upset Reports stomach upset 2/2 hx gastric polyps. Not true allergy.   Nitrofuran Derivatives Itching and Nausea And Vomiting    sick to stomach   Shellfish Allergy Itching, Swelling and Rash   Strawberry Flavoring Agent (Non-Screening) Nausea And Vomiting and Rash   Sulfa Antibiotics Nausea And Vomiting and Other (See Comments)    It just makes me real sick     ROS:  A comprehensive review of systems was negative except for: Genitourinary: positive for worsening renal failure  Hematologic/lymphatic: positive for bleeding  Blood pressure (!) 146/64, pulse 69, temperature 97.7 F (36.5 C), temperature source Oral, resp. rate 16, height 5' 4 (1.626 m), weight 100.5 kg, SpO2 97%. Physical Exam Vitals reviewed.  HENT:     Head: Normocephalic.  Cardiovascular:     Rate and Rhythm: Normal rate.  Pulmonary:     Effort: Pulmonary effort is normal.   Abdominal:     Palpations: Abdomen is soft.  Musculoskeletal:        General: Normal range of motion.     Cervical back: Normal range of motion.  Skin:    General: Skin is warm.  Neurological:     General: No focal deficit present.     Mental Status: She is alert and oriented to person, place, and time.     Results: Results for orders placed or performed during the hospital encounter of 06/25/24 (from the past 48 hours)  Glucose, capillary     Status: Abnormal   Collection Time: 06/28/24  7:33 PM  Result Value Ref Range   Glucose-Capillary 182 (H) 70 - 99 mg/dL    Comment: Glucose reference range applies only to samples taken after fasting for at least 8 hours.  Renal function panel     Status: Abnormal   Collection Time: 06/29/24  5:02 AM  Result Value Ref Range   Sodium 135 135 - 145 mmol/L   Potassium 5.3 (H) 3.5 - 5.1 mmol/L   Chloride 101 98 - 111 mmol/L   CO2 21 (L) 22 - 32 mmol/L   Glucose, Bld 159 (H) 70 - 99 mg/dL    Comment: Glucose reference range applies only to samples taken after fasting for at least 8 hours.   BUN 59 (H) 8 - 23 mg/dL   Creatinine, Ser 3.10 (H) 0.44 - 1.00 mg/dL   Calcium  8.8 (L) 8.9 - 10.3 mg/dL   Phosphorus 6.2 (H) 2.5 - 4.6 mg/dL   Albumin  3.3 (L) 3.5 - 5.0 g/dL   GFR, Estimated 6 (L) >60 mL/min    Comment: (NOTE) Calculated using the CKD-EPI Creatinine Equation (2021)    Anion gap 13 5 - 15    Comment: Performed at [facility], [address] [place], [place] [address]  CBC     Status: Abnormal   Collection Time: 06/29/24  5:02 AM  Result Value Ref Range   WBC 5.5 4.0 - 10.5 K/uL   RBC 2.54 (L) 3.87 - 5.11 MIL/uL   Hemoglobin 7.7 (L) 12.0 - 15.0 g/dL   HCT 76.7 (L) 63.9 - 53.9 %   MCV 91.3 80.0 - 100.0 fL   MCH 30.3 26.0 - 34.0 pg   MCHC 33.2 30.0 - 36.0 g/dL   RDW 83.1 (H) 88.4 - 84.4 %   Platelets 135 (L) 150 - 400 K/uL   nRBC 0.0 0.0 - 0.2 %    Comment: Performed at [facility], [address], [place], [place] [address]   Glucose, capillary     Status: Abnormal   Collection Time: 06/29/24  7:17 AM  Result Value Ref Range   Glucose-Capillary 143 (H) 70 - 99 mg/dL    Comment: Glucose reference range applies only to samples taken after fasting for at least 8 hours.  Glucose, capillary     Status: Abnormal   Collection Time: 06/29/24 10:51 AM  Result Value Ref Range   Glucose-Capillary 161 (H) 70 - 99 mg/dL    Comment: Glucose reference range applies only to samples taken after fasting for at least 8 hours.  Glucose, capillary     Status: Abnormal   Collection Time: 06/29/24  4:24 PM  Result Value Ref Range   Glucose-Capillary 160 (H) 70 - 99 mg/dL    Comment: Glucose reference range applies only to samples taken after fasting for at least 8 hours.  Glucose, capillary     Status: Abnormal   Collection Time: 06/29/24 10:27 PM  Result Value Ref Range   Glucose-Capillary 133 (H) 70 - 99 mg/dL    Comment: Glucose reference range applies only to samples taken after fasting for at least 8 hours.  Renal function panel     Status: Abnormal   Collection Time: 06/30/24  5:12 AM  Result Value Ref Range   Sodium 137 135 - 145 mmol/L   Potassium 4.4 3.5 - 5.1 mmol/L   Chloride 101 98 - 111 mmol/L   CO2 21 (L) 22 - 32 mmol/L   Glucose, Bld 111 (H) 70 - 99 mg/dL    Comment: Glucose reference range applies only to samples taken after fasting for at least 8 hours.   BUN 60 (H) 8 - 23 mg/dL   Creatinine, Ser 3.02 (H) 0.44 - 1.00 mg/dL   Calcium  9.0 8.9 - 10.3 mg/dL   Phosphorus 6.7 (H) 2.5 - 4.6 mg/dL   Albumin  3.3 (L) 3.5 - 5.0 g/dL   GFR, Estimated 6 (L) >60 mL/min    Comment: (NOTE) Calculated using the CKD-EPI Creatinine Equation (2021)    Anion gap 15 5 - 15    Comment: Performed at [facility], [address] [place], [place] [address]  CBC     Status: Abnormal   Collection Time: 06/30/24  5:12 AM  Result Value Ref Range   WBC 4.5 4.0 - 10.5 K/uL   RBC 2.48 (L) 3.87 - 5.11 MIL/uL   Hemoglobin 7.5 (L)  12.0 - 15.0 g/dL   HCT 77.1 (L) 63.9 - 53.9 %   MCV 91.9 80.0 - 100.0 fL   MCH 30.2 26.0 - 34.0 pg   MCHC 32.9 30.0 - 36.0 g/dL   RDW 83.2 (H) 88.4 - 84.4 %   Platelets 153 150 - 400 K/uL   nRBC 0.0  0.0 - 0.2 %    Comment: Performed at [facility], [address], [place], [place] [address]  Glucose, capillary     Status: Abnormal   Collection Time: 06/30/24  7:30 AM  Result Value Ref Range   Glucose-Capillary 115 (H) 70 - 99 mg/dL    Comment: Glucose reference range applies only to samples taken after fasting for at least 8 hours.  Prepare RBC (crossmatch)     Status: None   Collection Time: 06/30/24  9:12 AM  Result Value Ref Range   Order Confirmation      ORDER PROCESSED BY BLOOD BANK Performed at [facility], [address] [place], [place] [address]   Glucose, capillary     Status: Abnormal   Collection Time: 06/30/24 10:52 AM  Result Value Ref Range   Glucose-Capillary 120 (H) 70 - 99 mg/dL    Comment: Glucose reference range applies only to samples taken after fasting for at least 8 hours.  Glucose, capillary     Status: Abnormal   Collection Time: 06/30/24  1:35 PM  Result Value Ref Range   Glucose-Capillary 112 (H) 70 - 99 mg/dL    Comment: Glucose reference range applies only to samples taken after fasting for at least 8 hours.  Glucose, capillary     Status: Abnormal   Collection Time: 06/30/24  3:28 PM  Result Value Ref Range   Glucose-Capillary 131 (H) 70 - 99 mg/dL    Comment: Glucose reference range applies only to samples taken after fasting for at least 8 hours.  Glucose, capillary     Status: Abnormal   Collection Time: 06/30/24  4:07 PM  Result Value Ref Range   Glucose-Capillary 129 (H) 70 - 99 mg/dL    Comment: Glucose reference range applies only to samples taken after fasting for at least 8 hours.   None   Assessment & Plan:  Kindal Ponti is a 63 y.o. female with worsening renal failure needing to start dialysis. Discussed tunneled catheter and  risk of bleeding, infection, injury to vessels, pneumothorax, inability to place. Discussed fluoroscopy and US  use.  -OR tomorrow around 10AM    All questions were answered to the satisfaction of the patient.    [name] JAYSON Pander 06/30/2024, 5:02 PM

## 2024-06-30 NOTE — Progress Notes (Signed)
 Initial Nutrition Assessment  DOCUMENTATION CODES:   Obesity unspecified  INTERVENTION:   -Once diet is advanced, add:   -Nepro Shake po BID, each supplement provides 425 kcal and 19 grams protein  -Renal MVI daily -RD provided Nutrition for Dialysis handout from [facility]; attached to AVS/ discharge summary; she will also have access to outpatient RD at HD center  NUTRITION DIAGNOSIS:   Increased nutrient needs related to chronic illness (ESRD on HD) as evidenced by estimated needs.  GOAL:   Patient will meet greater than or equal to 90% of their needs  MONITOR:   PO intake, Supplement acceptance, Diet advancement  REASON FOR ASSESSMENT:   Consult Assessment of nutrition requirement/status, Diet education  ASSESSMENT:   63 y.o. female with medical history significant of generalized anxiety disorder with panic attacks, asthma. Chronic chest pain  CAD, STEMI with PCI x . GI bleed, DVT, Bipolar disorder, DM 2, neuropathy, hyperlipidemia, hypertension, GERD, CKD V, gastroparesis, anemia of chronic disease and a fib.  Admitted with chest pain, CAD, and STEMI.   10/14- s/p EGD- normal esophagus and large amount of food in the stomach  Reviewed I/O's: -1 L x 24 hours and -703 ml since admission  UOP: 1.2 L x 24 hours  Unavailable at time of visit. Attempted to speak with patient via call to hospital room phone, however, unable to reach. RD unable to obtain further nutrition-related history or complete nutrition-focused physical exam at this time.    Per nephology notes, plan to start HD today. Surgery has been consulted to place catheter. CLIP process has been initiated (she has a seat at [facility]).    She was previously on a heart healthy, carb modified diet. Noted meal completions 50-100%.   Reviewed weight history; she has experienced a 3.4% weight loss over the past 3 months, which is not significant for time frame. Per nursing assessment,  she has bilateral lower extremity edema which may be masking true weight loss as well as fat and muscle depletions.   Medications reviewed and include aranesp  and lactated ringers  infusion @ 40 ml/hr.   Lab Results  Component Value Date   HGBA1C 6.5 (H) 05/06/2024   PTA DM medications are 50 units lantus  solostar BID and 20 units novolog  t times daily.   Labs reviewed: Phos: 6.7, CBGS: 115-161 (inpatient orders for glycemic control are 0-9 units insulin  aspart TID before meals and at bedtime).    Diet Order:   Diet Order             Diet NPO time specified  Diet effective now                   EDUCATION NEEDS:   No education needs have been identified at this time  Skin:  Skin Assessment: Reviewed RN Assessment  Last BM:  06/27/24  Height:   Ht Readings from Last 1 Encounters:  06/30/24 5' 4 (1.626 m)    Weight:   Wt Readings from Last 1 Encounters:  06/30/24 100.5 kg    Ideal Body Weight:  54.5 kg  BMI:  Body mass index is 38.03 kg/m.  Estimated Nutritional Needs:   Kcal:  1700-1900  Protein:  85-100 grams  Fluid:  1000 ml + UOP    Margery ORN, RD, LDN, CDCES Registered Dietitian III Certified Diabetes Care and Education Specialist If unable to reach this RD, please use RD Inpatient group chat on secure chat between hours of 8am-4 pm daily

## 2024-06-30 NOTE — Progress Notes (Addendum)
 Contacted by nephrologist regarding need for CLIP to out-pt HD clinic. Will begin this process.   [name] [phone]  Addendum 11:07am Spoke with pt via phone to discuss outpatient clinic options, pt will need to set up transport (SW aware), pt does not have many preferences, would like it to be closest available to her house, MWF or TTS does not matter, but prefers a 2nd shift. Will submit referral to [facility] [place] at this time. Will need hep b total core lab drawn, have notified nephrology of this.   Addendum 1124am [facility] in [place] has a TTS, 1:45pm chair time available, discussed this with pt, she is agreeable. [facility] has been put on hold for her. Will continue completing referral, just awaiting 3 hep b labs

## 2024-06-30 NOTE — Op Note (Signed)
 [facility] Patient Name: [name] Procedure Date: 06/30/2024 12:29 PM MRN: [id number] Date of Birth: 04-15-1961 Attending MD: Lamar Ozell Hollingshead , MD, [id number] CSN: [id number] Age: 63 Admit Type: Outpatient Procedure:                Upper GI endoscopy Indications:              Iron deficiency anemia secondary to chronic blood                            loss Providers:                Lamar Ozell Hollingshead, MD, Crystal Page, Dorcas Lenis, Technician Referring MD:              Medicines:                Propofol per Anesthesia Complications:            No immediate complications. Estimated Blood Loss:     Estimated blood loss: none. Procedure:                Pre-Anesthesia Assessment:                           - Prior to the procedure, a History and Physical                            was performed, and patient medications and                            allergies were reviewed. The patient's tolerance of                            previous anesthesia was also reviewed. The risks                            and benefits of the procedure and the sedation                            options and risks were discussed with the patient.                            All questions were answered, and informed consent                            was obtained. Prior Anticoagulants: The patient                            last took Eliquis  (apixaban ) 3 days and Plavix                             (clopidogrel ) 1 day prior to the procedure. ASA  Grade Assessment: III - A patient with severe                            systemic disease. After reviewing the risks and                            benefits, the patient was deemed in satisfactory                            condition to undergo the procedure.                           After obtaining informed consent, the endoscope was                            passed under direct vision. Throughout the                             procedure, the patient's blood pressure, pulse, and                            oxygen  saturations were monitored continuously. The                            [id number] ([id number]) Upper was introduced through                            the mouth, and advanced to the second part of                            duodenum. The upper GI endoscopy was accomplished                            without difficulty. The patient tolerated the                            procedure well. Scope In: 3:11:47 PM Scope Out: 3:20:06 PM Total Procedure Duration: 0 hours 8 minutes 19 seconds  Findings:      The examined esophagus was normal. Minimal retained residue on the       lining of the gastric mucosa easily washed and lavaged off. Patient had       scattered changes of snake skinning or fishtail appearance of the       gastric mucosa particular in the body and fundus. No ulcer or       infiltrating process was seen. The patient had a clustering of 4 to 6 mm       hyperplastic appearing polyps/polypoid mucosa in the antrum. These       appeared to be innocent and benign. Pylorus patent      The duodenal bulb, second portion of the duodenum and third portion of       the duodenum were normal. Estimated blood loss: none. Ampulla seen in       the second portion of the duodenum fairly well. It also appeared normal.       Biopsies of  the gastric mucosa were taken for assess for H. pylori.       Also, biopsy of the polypoid mucosa in the antrum was taken to confirm       benignity Impression:               - Normal esophagus. Subtle mucosal changes                            consistent with portal hypertensive gastropathy.                            Rule out H. pylori. Status post gastric mucosal                            biopsy                           - Polyps/polypoid antral mucosa. Appeared benign                            and innocent?"status post biopsy                            -Normal duodenal bulb, second portion of the                            duodenum and third portion of the duodenum. Moderate Sedation:      Moderate (conscious) sedation was personally administered by an       anesthesia professional. The following parameters were monitored: oxygen        saturation, heart rate, blood pressure, respiratory rate, EKG, adequacy       of pulmonary ventilation, and response to care. Recommendation:           - Patient has a contact number available for                            emergencies. The signs and symptoms of potential                            delayed complications were discussed with the                            patient. Return to normal activities tomorrow.                            Written discharge instructions were provided to the                            patient.                           - Return patient to hospital ward for ongoing care.                            Advance diet as tolerated. Continue GI evaluation  as an outpatient. Ileocolonoscopy recommended in                            the next step in her evaluation. She may ultimately                            come to video capsule study of the small bowel.                           - Follow-up on pathology. Advance diet. Procedure Code(s):        --- Professional ---                           [phone], Esophagogastroduodenoscopy, flexible,                            transoral; diagnostic, including collection of                            specimen(s) by brushing or washing, when performed                            (separate procedure) Diagnosis Code(s):        --- Professional ---                           D50.0, Iron deficiency anemia secondary to blood                            loss (chronic) CPT copyright 2022 American Medical Association. All rights reserved. The codes documented in this report are preliminary and upon coder review may  be revised  to meet current compliance requirements. [name], MD Lamar Ozell Hollingshead, MD 06/30/2024 3:39:07 PM This report has been signed electronically. Number of Addenda: 0

## 2024-06-30 NOTE — Interval H&P Note (Signed)
 History and Physical Interval Note:  06/30/2024 3:03 PM  [name]  has presented today for surgery, with the diagnosis of anemia.  The various methods of treatment have been discussed with the patient and family. After consideration of risks, benefits and other options for treatment, the patient has consented to  Procedure(s): EGD (ESOPHAGOGASTRODUODENOSCOPY) (N/A) as a surgical intervention.  The patient's history has been reviewed, patient examined, no change in status, stable for surgery.  I have reviewed the patient's chart and labs.  Questions were answered to the patient's satisfaction.     Lamar Hollingshead

## 2024-06-30 NOTE — Plan of Care (Signed)
  Problem: Fluid Volume: Goal: Ability to maintain a balanced intake and output will improve Outcome: Progressing   Problem: Nutritional: Goal: Maintenance of adequate nutrition will improve Outcome: Progressing   Problem: Education: Goal: Knowledge of General Education information will improve Description: Including pain rating scale, medication(s)/side effects and non-pharmacologic comfort measures Outcome: Progressing   Problem: Health Behavior/Discharge Planning: Goal: Ability to manage health-related needs will improve Outcome: Progressing   Problem: Clinical Measurements: Goal: Ability to maintain clinical measurements within normal limits will improve Outcome: Progressing

## 2024-06-30 NOTE — Interval H&P Note (Signed)
 History and Physical Interval Note:  06/30/2024 2:56 PM  [name]  has presented today for surgery, with the diagnosis of anemia.  The various methods of treatment have been discussed with the patient and family. After consideration of risks, benefits and other options for treatment, the patient has consented to  Procedure(s): EGD (ESOPHAGOGASTRODUODENOSCOPY) (N/A) as a surgical intervention.  The patient's history has been reviewed, patient examined, no change in status, stable for surgery.  I have reviewed the patient's chart and labs.  Questions were answered to the patient's satisfaction.     Lamar Hollingshead   patient seen and examined in short stay.  Hemoglobin of 7.7 after 1 unit transfused.  Note no overt GI bleeding;  history of upper GI bleed felt to be secondary to gastric polyps.      Retained gastric contents yesterday.  Hopefully, empty upper GI tract today.  Denies dysphagia.  I have offered the patient a diagnostic EGD.  The risks, benefits, limitations, alternatives and imponderables have been reviewed with the patient. Potential for esophageal dilation, biopsy, etc. have also been reviewed.  Questions have been answered. All parties agreeable.

## 2024-06-30 NOTE — TOC Progression Note (Signed)
 Transition of Care [facility]) - Progression Note    Patient Details  Name: Chania Kochanski MRN: [id number] Date of Birth: [birth year]  [facility] of [facility]) CM/SW Contact  Hoy DELENA Bigness, LCSW Phone Number: 06/30/2024, 12:01 PM  Clinical Narrative:    CSW met with pt to discuss transportation barriers to get to outpatient HD appointments. Pt shares she does not drive and does not have family/friends who could provide transportation. Pt shares she has used Medicaid transportation and is agreeable to use their services again for future transportation needs. Medicaid transportation information provided to patient.    Expected Discharge Plan: Home/Self Care Barriers to Discharge: Continued Medical Work up               Expected Discharge Plan and Services                                               Social Drivers of Health (SDOH) Interventions SDOH Screenings   Food Insecurity: No Food Insecurity (06/26/2024)  Housing: Low Risk  (06/29/2024)  Transportation Needs: No Transportation Needs (06/26/2024)  Recent Concern: Transportation Needs - Unmet Transportation Needs (05/06/2024)  Utilities: Not At Risk (06/26/2024)  Tobacco Use: Medium Risk (06/29/2024)    Readmission Risk Interventions    06/26/2024   11:49 AM 05/07/2024   10:40 AM 06/05/2023   12:59 PM  Readmission Risk Prevention Plan  Transportation Screening Complete Complete Complete  PCP or Specialist Appt within 3-5 Days Not Complete    HRI or Home Care Consult Complete Complete   Social Work Consult for Recovery Care Planning/Counseling Complete Complete   Palliative Care Screening Not Applicable Not Applicable   Medication Review Oceanographer) Complete Complete Complete  HRI or Home Care Consult   Complete  SW Recovery Care/Counseling Consult   Complete  Palliative Care Screening   Not Applicable  Skilled Nursing Facility   Not Applicable

## 2024-06-30 NOTE — Progress Notes (Signed)
 PROGRESS NOTE    Jera Headings  FMW:[id number] DOB: 06/09/1961 DOA: 06/25/2024 PCP: System, Provider Not In    Brief Narrative:   63 year old with history of GAD, asthma, CAD status post PCI, GI bleed, DVT, bipolar, DM 2, peripheral neuropathy, HLD, HTN, GERD, CKD stage IV, gastroparesis, anemia of chronic disease and A-fib presents to the hospital with complaints of chest pain, diaphoresis and nausea.  It has been noted that patient has been having worsening renal function over the course of last several months.  Assessment & Plan:    Chest pain/CAD/STEMI with PCI hx -Prior history of PCI in [place] .  Has had ongoing history of off-and-on chest pain.  Currently troponins remain flat without any ischemic changes on the EKG.  Echocardiogram overall shows normal EF without any wall motion abnormalities.  At this time recommending medical therapy with Plavix , statin, Coreg , Imdur .  Patient will need outpatient close follow-up with cardiology team.   Elevated pro BNP  -No obvious evidence of exacerbation noted.  Echocardiogram shows preserved EF without any wall motion abnormalities  Acute anemia GI bleed - Hemoglobin down to 6.8 underwent EGD showing normal esophagus but had large amount of solid food therefore planning on repeat endoscopy today -PPI twice daily - PRBC transfusion : 10/13.  Will give additional unit to keep hemoglobin above 8 in the setting of advanced cardiac issues   A fib Paroxysmal Patient is in normal sinus rhythm.  Continue Coreg .  Holding Eliquis  in the setting of anemia   Type 2 diabetes mellitus with hyperglycemia A1c 6.5.  Adjust insulin  regimen as necessary  AKI on CKD stage V Patient's creatinine has fluctuated from 3.0-7.  Creatinine on 10/10 was 3.4, now trending up to 6.9.  Seen by nephrology, planning for HD catheter placement tomorrow with general surgery -Renal ultrasound-negative   Hyperkalemia ---With Lokelma   GAD/Bipolar Continue  lamictal , sertraline , klonopin  Has planned psych appointment in 08/2024     DVT prophylaxis: apixaban , on hold Code Status: Full  Family Communication:  Disposition: anticipate home once GI issues or renal issues are resolved.     PT Follow up Recs:   Subjective:  Seen at bedside, currently awaiting her endoscopy later today.  No other complaints  Examination:  General exam: Appears calm and comfortable  Respiratory system: Clear to auscultation. Respiratory effort normal. Cardiovascular system: S1 & S2 heard, RRR. No JVD, murmurs, rubs, gallops or clicks. No pedal edema. Gastrointestinal system: Abdomen is nondistended, soft and nontender. No organomegaly or masses felt. Normal bowel sounds heard. Central nervous system: Alert and oriented. No focal neurological deficits. Extremities: Symmetric 5 x 5 power. Skin: No rashes, lesions or ulcers Psychiatry: Judgement and insight appear normal. Mood & affect appropriate.                Diet Orders (From admission, onward)     Start     Ordered   07/01/24 0001  Diet NPO time specified  Diet effective midnight       Comments: For tunneled dialysis catheter on 10/16   06/30/24 1029   06/30/24 0001  Diet NPO time specified  Diet effective midnight        06/29/24 1200            Objective: Vitals:   06/29/24 1245 06/29/24 2017 06/30/24 0432 06/30/24 0902  BP: (!) 185/80 (!) 139/56 139/66 (!) 156/72  Pulse: 71   71  Resp: 18     Temp: 97.8 F (36.6 C)  98.2 F (36.8 C) 98.2 F (36.8 C) 98.4 F (36.9 C)  TempSrc: Oral Oral Oral Oral  SpO2: 99% 94% 98% 97%  Weight:      Height:        Intake/Output Summary (Last 24 hours) at 06/30/2024 1153 Last data filed at 06/30/2024 0432 Gross per 24 hour  Intake 200 ml  Output 750 ml  Net -550 ml   Filed Weights   06/25/24 2129 06/28/24 0634 06/29/24 1057  Weight: 104.1 kg 100.5 kg 100.5 kg    Scheduled Meds:  sodium chloride    Intravenous Once    atorvastatin   40 mg Oral q morning   carvedilol   6.25 mg Oral BID WC   Chlorhexidine Gluconate Cloth  6 each Topical Q0600   clopidogrel   75 mg Oral Daily   darbepoetin (ARANESP ) injection - DIALYSIS  100 mcg Subcutaneous Q Mon-1800   famotidine   20 mg Oral Daily   insulin  aspart  0-9 Units Subcutaneous TID AC & HS   isosorbide  mononitrate  90 mg Oral q morning   lamoTRIgine   100 mg Oral BID   pantoprazole   40 mg Oral BID AC   sertraline   100 mg Oral Daily   Continuous Infusions:  Nutritional status     Body mass index is 38.03 kg/m.  Data Reviewed:   CBC: Recent Labs  Lab 06/25/24 2329 06/28/24 0414 06/29/24 0502 06/30/24 0512  WBC 5.8 6.2 5.5 4.5  NEUTROABS 3.9  --   --   --   HGB 9.1* 6.8* 7.7* 7.5*  HCT 27.1* 20.7* 23.2* 22.8*  MCV 92.2 93.7 91.3 91.9  PLT 235 152 135* 153   Basic Metabolic Panel: Recent Labs  Lab 06/25/24 2329 06/27/24 0426 06/28/24 0246 06/29/24 0502 06/30/24 0512  NA 133* 136 137 135 137  K 4.8 4.9 5.3* 5.3* 4.4  CL 99 101 102 101 101  CO2 22 23 21* 21* 21*  GLUCOSE 238* 148* 165* 159* 111*  BUN 31* 43* 51* 59* 60*  CREATININE 3.40* 5.50* 6.89* 6.89* 6.97*  CALCIUM  9.5 8.8* 8.7* 8.8* 9.0  MG 1.9  --   --   --   --   PHOS  --   --  5.7* 6.2* 6.7*   GFR: Estimated Creatinine Clearance: 9.6 mL/min (A) (by C-G formula based on SCr of 6.97 mg/dL (H)). Liver Function Tests: Recent Labs  Lab 06/25/24 2329 06/28/24 0246 06/29/24 0502 06/30/24 0512  AST 10*  --   --   --   ALT 11  --   --   --   ALKPHOS 82  --   --   --   BILITOT 0.5  --   --   --   PROT 9.5*  --   --   --   ALBUMIN  4.1 3.3* 3.3* 3.3*   No results for input(s): LIPASE, AMYLASE in the last 168 hours. No results for input(s): AMMONIA in the last 168 hours. Coagulation Profile: No results for input(s): INR, PROTIME in the last 168 hours. Cardiac Enzymes: No results for input(s): CKTOTAL, CKMB, CKMBINDEX, TROPONINI in the last 168 hours. BNP  (last 3 results) Recent Labs    06/25/24 2329  PROBNP 1,087.0*   HbA1C: No results for input(s): HGBA1C in the last 72 hours. CBG: Recent Labs  Lab 06/29/24 1051 06/29/24 1624 06/29/24 2227 06/30/24 0730 06/30/24 1052  GLUCAP 161* 160* 133* 115* 120*   Lipid Profile: No results for input(s): CHOL, HDL, LDLCALC, TRIG, CHOLHDL, LDLDIRECT in  the last 72 hours. Thyroid Function Tests: No results for input(s): TSH, T4TOTAL, FREET4, T3FREE, THYROIDAB in the last 72 hours. Anemia Panel: Recent Labs    06/28/24 0942  FERRITIN 326*  TIBC 214*  IRON 32   Sepsis Labs: No results for input(s): PROCALCITON, LATICACIDVEN in the last 168 hours.  No results found for this or any previous visit (from the past 240 hours).       Radiology Studies: No results found.         LOS: 4 days   Time spent= 35 mins    Burgess JAYSON Dare, MD [facility]  If 7PM-7AM, please contact night-coverage  06/30/2024, 11:53 AM

## 2024-06-30 NOTE — Transfer of Care (Signed)
 Immediate Anesthesia Transfer of Care Note  Patient: [name]  Procedure(s) Performed: EGD (ESOPHAGOGASTRODUODENOSCOPY)  Patient Location: PACU  Anesthesia Type:General  Level of Consciousness: awake, alert , oriented, and patient cooperative  Airway & Oxygen  Therapy: Patient Spontanous Breathing  Post-op Assessment: Report given to RN, Post -op Vital signs reviewed and stable, and Patient moving all extremities X 4  Post vital signs: Reviewed and stable  Last Vitals:  Vitals Value Taken Time  BP 99/44 1523  Temp 98.4 1520  Pulse 69 1520  Resp 15 1520  SpO2 96 1520    Last Pain:  Vitals:   06/30/24 1505  TempSrc:   PainSc: 0-No pain      Patients Stated Pain Goal: 3 (06/29/24 1057)  Complications: No notable events documented.

## 2024-06-30 NOTE — Progress Notes (Addendum)
 Admit: 06/25/2024 LOS: 4  81F AKI on CKD5, AoC Anemia, recurrent chest pain with history of ASCVD/CAD.   Subjective:  She had 1.2 L UOP over 10/14 as well as one unmeasured urine void.  She states sometimes she has some trouble with her memory and though she was conversant with me we called her son during my exam.  (Not able to reach her husband or daughter via phone).  She states that she has spoken with her family about how she thinks she'll need dialysis soon.  She has continued to have some chest pain.  Spoke with cardiology - they are following her symptoms closely and plans may change if on HD.  We discussed risks/benefits/indications for dialysis and she does consent to starting dialysis.     Review of systems:  Some shortness of breath  She reports nausea overnight She has had ongoing chest pain   10/14 0701 - 10/15 0700 In: 200 [I.V.:200] Out: 1200 [Urine:1200]  Filed Weights   06/25/24 2129 06/28/24 0634 06/29/24 1057  Weight: 104.1 kg 100.5 kg 100.5 kg    Scheduled Meds:  sodium chloride    Intravenous Once   sodium chloride    Intravenous Once   atorvastatin   40 mg Oral q morning   carvedilol   6.25 mg Oral BID WC   clopidogrel   75 mg Oral Daily   darbepoetin (ARANESP ) injection - DIALYSIS  100 mcg Subcutaneous Q Mon-1800   famotidine   20 mg Oral Daily   insulin  aspart  0-9 Units Subcutaneous TID AC & HS   isosorbide  mononitrate  90 mg Oral q morning   lamoTRIgine   100 mg Oral BID   pantoprazole   40 mg Oral BID AC   sertraline   100 mg Oral Daily   Continuous Infusions: PRN Meds:.acetaminophen , clonazePAM , fentaNYL  (SUBLIMAZE ) injection, glucagon (human recombinant), hydrALAZINE , ipratropium-albuterol , mouth rinse, senna-docusate  Current Labs: reviewed  Lab Results  Component Value Date   NA 137 06/30/2024   CL 101 06/30/2024   K 4.4 06/30/2024   CO2 21 (L) 06/30/2024   BUN 60 (H) 06/30/2024   CREATININE 6.97 (H) 06/30/2024   GFRNONAA 6 (L) 06/30/2024    CALCIUM  9.0 06/30/2024   PHOS 6.7 (H) 06/30/2024   ALBUMIN  3.3 (L) 06/30/2024   GLUCOSE 111 (H) 06/30/2024     10/14 RFP: K5.3, bicarbonate 21, creatinine 6.9, BUN 59 consistent with stable unchanged AKI 10/14 CBC: Hemoglobin to 7.7 after 1 unit PRBC yesterday WBC and platelet count remain stable 10/13 iron panel: TSAT 15%, ferritin 326   Physical Exam:  Blood pressure (!) 156/72, pulse 71, temperature 98.4 F (36.9 C), temperature source Oral, resp. rate 18, height 5' 4 (1.626 m), weight 100.5 kg, SpO2 97%.  General adult female in bed in no acute distress HEENT normocephalic atraumatic extraocular movements intact sclera anicteric Neck supple trachea midline Lungs clear to auscultation bilaterally normal work of breathing at rest  Heart S1S2 no rub Abdomen soft nontender nondistended Extremities no edema  Psych normal mood and affect Neuro alert and oriented x3 provides hx and follows commands   Assessment and plan:    AKI on CKD5 High risk of progressing to dialysis dependence.  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  Specific etiology unclear.  No obvious nephrotoxin exposure or identified threat to volume status.  Perhaps related to worsening of anemia.  She has had some nausea and still has had some chest pain Start HD on 10/16 after tunneled catheter placement  I have called surgery to request tunneled  catheter - appreciate Dr. Kallie I have reached out to initiate CLIP process to get an outpatient HD unit.  Appreciate Abigail  CKD stage V - creatinine baseline in August was 5.3 - 5.6.  her baseline GFR has been largely under 10 for the past year (limited data between end of Sept 2024 and present admission) Acute on chronic anemia in the setting of anemia of CKD: Transfused 1 unit PRBC 10/13 with appropriate increase in hemoglobin.  TSAT 15% with ferritin 12/09/2008/13.  Aranesp  100 mcg given 10/13.   Recurrent chest pain with significant history of CAD and PCI: Troponins  reassuring.  Cardiology following, currently medical management.  Spoke with cardiology to update them of plans for HD Mild hyperkalemia: s/p lokelma. Currently NPO Longstanding DM2 with microvascular disease History of GI bleed and A-fib on apixaban  at presentation; held currently 2/2 #2   Other Medication Recs: Preferred narcotic agents for pain control are hydromorphone , fentanyl , and methadone. Morphine  should not be used.  Baclofen should be avoided Avoid oral sodium phosphate and magnesium  citrate based laxatives / bowel preps      Recent Labs  Lab 06/28/24 0246 06/29/24 0502 06/30/24 0512  NA 137 135 137  K 5.3* 5.3* 4.4  CL 102 101 101  CO2 21* 21* 21*  GLUCOSE 165* 159* 111*  BUN 51* 59* 60*  CREATININE 6.89* 6.89* 6.97*  CALCIUM  8.7* 8.8* 9.0  PHOS 5.7* 6.2* 6.7*   Recent Labs  Lab 06/25/24 2329 06/28/24 0414 06/29/24 0502 06/30/24 0512  WBC 5.8 6.2 5.5 4.5  NEUTROABS 3.9  --   --   --   HGB 9.1* 6.8* 7.7* 7.5*  HCT 27.1* 20.7* 23.2* 22.8*  MCV 92.2 93.7 91.3 91.9  PLT 235 152 135* 153     Katheryn JAYSON Saba, MD 10:32 AM 06/30/2024    To clarify, she has progressed to ESRD   Katheryn JAYSON Saba, MD 11:45 AM 06/30/2024

## 2024-07-01 ENCOUNTER — Inpatient Hospital Stay (HOSPITAL_COMMUNITY)

## 2024-07-01 ENCOUNTER — Encounter (HOSPITAL_COMMUNITY)
Admission: EM | Disposition: A | Discharge status: Home / Self Care | Payer: Self-pay | Source: Home / Self Care | Attending: Family Medicine

## 2024-07-01 ENCOUNTER — Inpatient Hospital Stay (HOSPITAL_COMMUNITY): Admitting: Anesthesiology

## 2024-07-01 ENCOUNTER — Encounter (HOSPITAL_COMMUNITY): Payer: Self-pay | Admitting: General Surgery

## 2024-07-01 ENCOUNTER — Telehealth (INDEPENDENT_AMBULATORY_CARE_PROVIDER_SITE_OTHER): Payer: Self-pay | Admitting: Gastroenterology

## 2024-07-01 DIAGNOSIS — I2089 Other forms of angina pectoris: Secondary | ICD-10-CM

## 2024-07-01 DIAGNOSIS — I25119 Atherosclerotic heart disease of native coronary artery with unspecified angina pectoris: Secondary | ICD-10-CM

## 2024-07-01 DIAGNOSIS — Z452 Encounter for adjustment and management of vascular access device: Secondary | ICD-10-CM | POA: Diagnosis not present

## 2024-07-01 HISTORY — PX: INSERTION OF DIALYSIS CATHETER: SHX1324

## 2024-07-01 LAB — CBC
HCT: 26.7 % — ABNORMAL LOW (ref 36.0–46.0)
Hemoglobin: 8.9 g/dL — ABNORMAL LOW (ref 12.0–15.0)
MCH: 30 pg (ref 26.0–34.0)
MCHC: 33.3 g/dL (ref 30.0–36.0)
MCV: 89.9 fL (ref 80.0–100.0)
Platelets: 155 K/uL (ref 150–400)
RBC: 2.97 MIL/uL — ABNORMAL LOW (ref 3.87–5.11)
RDW: 15.9 % — ABNORMAL HIGH (ref 11.5–15.5)
WBC: 4 K/uL (ref 4.0–10.5)
nRBC: 0 % (ref 0.0–0.2)

## 2024-07-01 LAB — RENAL FUNCTION PANEL
Albumin: 3.4 g/dL — ABNORMAL LOW (ref 3.5–5.0)
Anion gap: 15 (ref 5–15)
BUN: 57 mg/dL — ABNORMAL HIGH (ref 8–23)
CO2: 21 mmol/L — ABNORMAL LOW (ref 22–32)
Calcium: 9 mg/dL (ref 8.9–10.3)
Chloride: 101 mmol/L (ref 98–111)
Creatinine, Ser: 6.63 mg/dL — ABNORMAL HIGH (ref 0.44–1.00)
GFR, Estimated: 7 mL/min — ABNORMAL LOW (ref >60)
Glucose, Bld: 144 mg/dL — ABNORMAL HIGH (ref 70–99)
Phosphorus: 6.6 mg/dL — ABNORMAL HIGH (ref 2.5–4.6)
Potassium: 4 mmol/L (ref 3.5–5.1)
Sodium: 136 mmol/L (ref 135–145)

## 2024-07-01 LAB — HEPATITIS B SURFACE ANTIBODY, QUANTITATIVE: Hep B S AB Quant (Post): <3.5 m[IU]/mL — ABNORMAL LOW

## 2024-07-01 LAB — GLUCOSE, CAPILLARY
Glucose-Capillary: 134 mg/dL — ABNORMAL HIGH (ref 70–99)
Glucose-Capillary: 150 mg/dL — ABNORMAL HIGH (ref 70–99)
Glucose-Capillary: 129 mg/dL — ABNORMAL HIGH (ref 70–99)
Glucose-Capillary: 151 mg/dL — ABNORMAL HIGH (ref 70–99)

## 2024-07-01 LAB — PHOSPHORUS: Phosphorus: 6.6 mg/dL — ABNORMAL HIGH (ref 2.5–4.6)

## 2024-07-01 LAB — BASIC METABOLIC PANEL WITH GFR
Anion gap: 14 (ref 5–15)
BUN: 58 mg/dL — ABNORMAL HIGH (ref 8–23)
CO2: 21 mmol/L — ABNORMAL LOW (ref 22–32)
Calcium: 8.9 mg/dL (ref 8.9–10.3)
Chloride: 100 mmol/L (ref 98–111)
Creatinine, Ser: 6.66 mg/dL — ABNORMAL HIGH (ref 0.44–1.00)
GFR, Estimated: 7 mL/min — ABNORMAL LOW (ref >60)
Glucose, Bld: 144 mg/dL — ABNORMAL HIGH (ref 70–99)
Potassium: 4 mmol/L (ref 3.5–5.1)
Sodium: 136 mmol/L (ref 135–145)

## 2024-07-01 LAB — MAGNESIUM: Magnesium: 1.9 mg/dL (ref 1.7–2.4)

## 2024-07-01 SURGERY — INSERTION OF DIALYSIS CATHETER
Anesthesia: General | Site: Neck | Laterality: Right

## 2024-07-01 MED ORDER — LIDOCAINE HCL (PF) 1 % IJ SOLN: 5.0000 mL | INTRAMUSCULAR | Status: DC | PRN

## 2024-07-01 MED ORDER — PENTAFLUOROPROP-TETRAFLUOROETH EX AERO: 1.0000 | INHALATION_SPRAY | CUTANEOUS | Status: DC | PRN

## 2024-07-01 MED ORDER — HEPARIN SODIUM (PORCINE) 1000 UNIT/ML IJ SOLN
INTRAMUSCULAR | Status: DC | PRN
  Administered 2024-07-01: 4000 [IU]

## 2024-07-01 MED ORDER — LIDOCAINE HCL (PF) 1 % IJ SOLN
INTRAMUSCULAR | Status: AC
  Filled 2024-07-01: qty 30

## 2024-07-01 MED ORDER — CHLORHEXIDINE GLUCONATE CLOTH 2 % EX PADS
6.0000 | MEDICATED_PAD | Freq: Every day | CUTANEOUS | Status: DC
  Administered 2024-07-02 – 2024-07-04 (×3): 6 via TOPICAL

## 2024-07-01 MED ORDER — SODIUM CHLORIDE (PF) 0.9 % IJ SOLN
INTRAMUSCULAR | Status: DC | PRN
  Administered 2024-07-01: 500 mL via INTRAVENOUS

## 2024-07-01 MED ORDER — MIDAZOLAM HCL 2 MG/2ML IJ SOLN
INTRAMUSCULAR | Status: AC
  Filled 2024-07-01: qty 2

## 2024-07-01 MED ORDER — LIDOCAINE-PRILOCAINE 2.5-2.5 % EX CREA: 1.0000 | TOPICAL_CREAM | CUTANEOUS | Status: DC | PRN

## 2024-07-01 MED ORDER — DIPHENHYDRAMINE-ZINC ACETATE 2-0.1 % EX CREA
TOPICAL_CREAM | Freq: Three times a day (TID) | CUTANEOUS | Status: DC | PRN
  Filled 2024-07-01: qty 28

## 2024-07-01 MED ORDER — LIDOCAINE 2% (20 MG/ML) 5 ML SYRINGE
INTRAMUSCULAR | Status: AC
  Filled 2024-07-01: qty 5

## 2024-07-01 MED ORDER — MIDAZOLAM HCL (PF) 2 MG/2ML IJ SOLN
INTRAMUSCULAR | Status: DC | PRN
  Administered 2024-07-01: 2 mg via INTRAVENOUS

## 2024-07-01 MED ORDER — ALTEPLASE 2 MG IJ SOLR: 2.0000 mg | Freq: Once | INTRAMUSCULAR | Status: DC | PRN

## 2024-07-01 MED ORDER — PHENYLEPHRINE 80 MCG/ML (10ML) SYRINGE FOR IV PUSH (FOR BLOOD PRESSURE SUPPORT)
PREFILLED_SYRINGE | INTRAVENOUS | Status: AC
  Filled 2024-07-01: qty 10

## 2024-07-01 MED ORDER — HEPARIN SODIUM (PORCINE) 1000 UNIT/ML IJ SOLN
INTRAMUSCULAR | Status: AC
  Filled 2024-07-01: qty 4

## 2024-07-01 MED ORDER — PROPOFOL 10 MG/ML IV BOLUS
INTRAVENOUS | Status: AC
  Filled 2024-07-01: qty 20

## 2024-07-01 MED ORDER — LACTATED RINGERS IV SOLN: INTRAVENOUS | Status: DC

## 2024-07-01 MED ORDER — PROPOFOL 10 MG/ML IV BOLUS
INTRAVENOUS | Status: DC | PRN
  Administered 2024-07-01: 150 mg via INTRAVENOUS

## 2024-07-01 MED ORDER — ONDANSETRON HCL 4 MG/2ML IJ SOLN
INTRAMUSCULAR | Status: AC
  Filled 2024-07-01: qty 2

## 2024-07-01 MED ORDER — DIPHENHYDRAMINE HCL 25 MG PO CAPS
25.0000 mg | ORAL_CAPSULE | Freq: Once | ORAL | Status: DC
  Filled 2024-07-01: qty 1

## 2024-07-01 MED ORDER — FENTANYL CITRATE (PF) 100 MCG/2ML IJ SOLN
INTRAMUSCULAR | Status: DC | PRN
  Administered 2024-07-01: 100 ug via INTRAVENOUS

## 2024-07-01 MED ORDER — OXYCODONE HCL 5 MG PO TABS
5.0000 mg | ORAL_TABLET | ORAL | Status: DC | PRN
  Administered 2024-07-01 – 2024-07-04 (×7): 5 mg via ORAL
  Filled 2024-07-01 (×8): qty 1

## 2024-07-01 MED ORDER — HEPARIN SODIUM (PORCINE) 1000 UNIT/ML IJ SOLN
1000.0000 [IU] | Freq: Once | INTRAMUSCULAR | Status: AC
  Administered 2024-07-01: 3200 [IU]

## 2024-07-01 MED ORDER — ANTICOAGULANT SODIUM CITRATE 4% (200MG/5ML) IV SOLN
5.0000 mL | Status: DC | PRN
  Filled 2024-07-01 (×2): qty 5

## 2024-07-01 MED ORDER — FENTANYL CITRATE (PF) 100 MCG/2ML IJ SOLN
INTRAMUSCULAR | Status: AC
  Filled 2024-07-01: qty 2

## 2024-07-01 MED ORDER — LIDOCAINE HCL (PF) 1 % IJ SOLN
INTRAMUSCULAR | Status: DC | PRN
  Administered 2024-07-01: 10 mL

## 2024-07-01 MED ORDER — PHENYLEPHRINE 80 MCG/ML (10ML) SYRINGE FOR IV PUSH (FOR BLOOD PRESSURE SUPPORT)
PREFILLED_SYRINGE | INTRAVENOUS | Status: DC | PRN
  Administered 2024-07-01: 160 ug via INTRAVENOUS

## 2024-07-01 SURGICAL SUPPLY — 36 items
APPLICATOR CHLORAPREP 10.5 ORG (MISCELLANEOUS) ×1 IMPLANT
BAG DECANTER FOR FLEXI CONT (MISCELLANEOUS) ×1 IMPLANT
BIOPATCH RED 1 DISK 7.0 (GAUZE/BANDAGES/DRESSINGS) ×1 IMPLANT
CATH PALINDROME-P 19CM W/VT (CATHETERS) IMPLANT
COUNTER NDL MAGNETIC 40 RED (SET/KITS/TRAYS/PACK) ×1 IMPLANT
COUNTER NEEDLE MAGNETIC 40 RED (SET/KITS/TRAYS/PACK) ×1 IMPLANT
COVER LIGHT HANDLE (MISCELLANEOUS) IMPLANT
COVER PROBE U/S 5X48 (MISCELLANEOUS) ×1 IMPLANT
DERMABOND ADVANCED .7 DNX12 (GAUZE/BANDAGES/DRESSINGS) ×1 IMPLANT
DRAPE C-ARM FOLDED MOBILE STRL (DRAPES) ×1 IMPLANT
DRAPE CHEST BREAST 15X10 FENES (DRAPES) ×1 IMPLANT
DRSG SORBAVIEW 3.5X5-5/16 MED (GAUZE/BANDAGES/DRESSINGS) ×1 IMPLANT
ELECTRODE REM PT RTRN 9FT ADLT (ELECTROSURGICAL) ×1 IMPLANT
GAUZE 4X4 16PLY ~~LOC~~+RFID DBL (SPONGE) ×1 IMPLANT
GEL ULTRASOUND 20GR AQUASONIC (MISCELLANEOUS) ×1 IMPLANT
GLOVE BIO SURGEON STRL SZ 6.5 (GLOVE) ×1 IMPLANT
GLOVE BIOGEL PI IND STRL 6.5 (GLOVE) ×1 IMPLANT
GLOVE BIOGEL PI IND STRL 7.0 (GLOVE) ×2 IMPLANT
GOWN STRL REUS W/TWL LRG LVL3 (GOWN DISPOSABLE) ×2 IMPLANT
IV NS 500ML BAXH (IV SOLUTION) ×1 IMPLANT
KIT TURNOVER KIT A (KITS) ×1 IMPLANT
MARKER SKIN DUAL TIP RULER LAB (MISCELLANEOUS) ×1 IMPLANT
NDL HYPO 18GX1.5 BLUNT FILL (NEEDLE) ×1 IMPLANT
NDL HYPO 25X1 1.5 SAFETY (NEEDLE) ×1 IMPLANT
NEEDLE HYPO 18GX1.5 BLUNT FILL (NEEDLE) ×1 IMPLANT
NEEDLE HYPO 25X1 1.5 SAFETY (NEEDLE) ×1 IMPLANT
PACK SRG BSC III STRL LF ECLPS (CUSTOM PROCEDURE TRAY) ×1 IMPLANT
PAD ARMBOARD POSITIONER FOAM (MISCELLANEOUS) ×1 IMPLANT
PENCIL SMOKE EVACUATOR COATED (MISCELLANEOUS) ×1 IMPLANT
POSITIONER HEAD 8X9X4 ADT (SOFTGOODS) ×1 IMPLANT
SET BASIN LINEN APH (SET/KITS/TRAYS/PACK) ×1 IMPLANT
SUT MNCRL AB 4-0 PS2 18 (SUTURE) ×1 IMPLANT
SUT SILK 2 0 FSL 18 (SUTURE) ×1 IMPLANT
SUT VIC AB 3-0 SH 27X BRD (SUTURE) ×1 IMPLANT
SYR 10ML LL (SYRINGE) ×2 IMPLANT
SYR CONTROL 10ML LL (SYRINGE) ×1 IMPLANT

## 2024-07-01 NOTE — Progress Notes (Addendum)
 Pt has clinic on hold at [name] for MWF, pt has been financially cleared at this time.   However, [facility] admissions will not move fwd with clinical clearance and scheduling until 3 hep b labs have results.  Will monitor for this result and submit when applicable. Navigator has informed admissions that this pt is ready for d/c and to please divert. Will update when possible.   Anbigail Euna [facility] [phone]   Addendum2:59pm  Hep b core still has not resulted. Awaiting result.

## 2024-07-01 NOTE — Op Note (Addendum)
 Operative Note 07/01/24   Preoperative Diagnosis: End Stage Renal Disease    Postoperative Diagnosis: Same   Procedure(s) Performed: Tunneled Dialysis Catheter Placement, Right Internal Jugular    Surgeon: [name] BROCKS. Kallie, MD   Assistants: No qualified resident was available   Anesthesia: General anesthesia    Anesthesiologist: Kendell Yvonna PARAS, MD    Specimens: None   Estimated Blood Loss: Minimal   Fluoroscopy time: 2 seconds   Blood Replacement: None    Complications: None    Operative Findings: Normal anatomy  Indications: Ms. Sergent is a 63 yo with worsening renal disease that needs a catheter. We discussed risk of bleeding, infection, injury to vessels, pneumothorax, malfunction or inability to place.   Procedure: The patient was brought into the operating room and anesthesia was induced.   The right and left chest and neck was prepped and draped in the usual sterile fashion.  Preoperative antibiotics were given.   An Ultrasound was used to verify that the right internal jugular vein was patent.  One percent lidocaine  was used for local anesthesia.  The patient was measured and a 19 cm Palindrome dual lumen dialysis catheter.  The needles advanced into the right internal jugular vein using the Seldinger technique without difficulty.  A guidewire was then advanced into the right atrium under fluoroscopic guidance.  Ectopia was not noted.  The wire was secured.  An incision was made over the right chest and the catheter was tunneled to the neck.  The ultrasound again confirmed the wire was going into the vein only. Dilators were used over the wire to dilate the track.  An introducer and peel-away sheath were placed over the guidewire. The catheter was then inserted through the peel-away sheath and the peel-away sheath was removed.  A spot film was performed to confirm the position.  The catheter drew back and flushed easily. The lumens were packed with heparin . Hemostats were used  to position the catheter in the neck incision. The neck incision was closed with 4-0 Monocryl and Dermabond. The catheter was secured with 2-0 silk suture and a sterile Biopatch and dressing was applied. Caps were placed and catheter was clipped off with the locking device.  Hemostasis was confirmed.     All tape and needle counts were correct at the end of the procedure. The patient was transferred to PACU in stable condition. A chest x-ray will be performed at that time.  [name] Kallie, MD [facility] [address] [place], [place] [address] [phone] (office)

## 2024-07-01 NOTE — Transfer of Care (Signed)
 Immediate Anesthesia Transfer of Care Note  Patient: [name]  Procedure(s) Performed: INSERTION OF DIALYSIS CATHETER, tunneled catheter (Right: Neck)  Patient Location: PACU  Anesthesia Type:General  Level of Consciousness: awake, alert , oriented, and patient cooperative  Airway & Oxygen  Therapy: Patient Spontanous Breathing and Patient connected to face mask oxygen   Post-op Assessment: Report given to RN, Post -op Vital signs reviewed and stable, and Patient moving all extremities X 4  Post vital signs: Reviewed and stable  Last Vitals:  Vitals Value Taken Time  BP 157/79 07/01/24 11:32  Temp 36.8 C 07/01/24 11:32  Pulse 67 07/01/24 11:39  Resp 17 07/01/24 11:39  SpO2 100 % 07/01/24 11:39  Vitals shown include unfiled device data.  Last Pain:  Vitals:   07/01/24 0847  TempSrc: Oral  PainSc: 0-No pain      Patients Stated Pain Goal: 3 (07/01/24 0847)  Complications: No notable events documented.

## 2024-07-01 NOTE — Progress Notes (Signed)
 PROGRESS NOTE    [name]  FMW:[id number] DOB: 1960/10/06 DOA: 06/25/2024 PCP: System, Provider Not In    Brief Narrative:   63 year old with history of GAD, asthma, CAD status post PCI, GI bleed, DVT, bipolar, DM 2, peripheral neuropathy, HLD, HTN, GERD, CKD stage IV, gastroparesis, anemia of chronic disease and A-fib presents to the hospital with complaints of chest pain, diaphoresis and nausea.  It has been noted that patient has been having worsening renal function over the course of last several months.   During hospitalization GI was consulted for any bleeding evaluation but no obvious evidence was found.  Currently recommending PPI and outpatient follow-up for further workup. Patient was also seen by cardiology team, no further workup indicated at this time and continue routine medical therapy. Endoscopy showed concerns for possible portal hypertensive gastropathy status post biopsy.  HD catheter placed on 10/16.  Nephrology will work on getting outpatient HD arrangements.  Assessment & Plan:  AKI on CKD stage V Patient's creatinine has fluctuated from 3.0-7.  Creatinine remains high, placed HD catheter today. -Renal ultrasound-negative  Chest pain/CAD/STEMI with PCI hx -Prior history of PCI in [place] .  Has had ongoing history of off-and-on chest pain.  Currently troponins remain flat without any ischemic changes on the EKG.  Echocardiogram overall shows normal EF without any wall motion abnormalities.  At this time recommending medical therapy with Plavix , statin, Coreg , Imdur .  Patient will need outpatient close follow-up with cardiology team.   Elevated pro BNP  -No obvious evidence of exacerbation noted.  Echocardiogram shows preserved EF without any wall motion abnormalities  Acute anemia GI bleed - Hemoglobin down to 6.8 underwent EGD showing normal esophagus but had large amount of solid food therefore repeated on 10/15 showing normal esophagus, possible  consistent change with portal hypertensive gastropathy status post biopsy.  Recommend outpatient follow-up and possible ileocolonoscopy and possible VCE -PPI twice daily - PRBC transfusion : 10/13, 10/15.  Hemoglobin now stable at 8.9.   A fib Paroxysmal Patient is in normal sinus rhythm.  Continue Coreg .  Holding Eliquis  in the setting of anemia   Type 2 diabetes mellitus with hyperglycemia A1c 6.5.  Adjust insulin  regimen as necessary     Hyperkalemia ---With Lokelma   GAD/Bipolar Continue lamictal , sertraline , klonopin  Has planned psych appointment in 08/2024     DVT prophylaxis: apixaban , on hold Code Status: Full  Family Communication:  Disposition: HD catheter placed today.  Plans to arrange for dialysis     PT Follow up Recs:   Subjective:  Tunneled HD catheter placement this morning  Examination:  General exam: Appears calm and comfortable  Respiratory system: Clear to auscultation. Respiratory effort normal. Cardiovascular system: S1 & S2 heard, RRR. No JVD, murmurs, rubs, gallops or clicks. No pedal edema. Gastrointestinal system: Abdomen is nondistended, soft and nontender. No organomegaly or masses felt. Normal bowel sounds heard. Central nervous system: Alert and oriented. No focal neurological deficits. Extremities: Symmetric 5 x 5 power. Skin: No rashes, lesions or ulcers Psychiatry: Judgement and insight appear normal. Mood & affect appropriate.                Diet Orders (From admission, onward)     Start     Ordered   07/01/24 0001  Diet NPO time specified  Diet effective midnight        06/30/24 1801            Objective: Vitals:   07/01/24 0146 07/01/24 0201 07/01/24  0445 07/01/24 0847  BP: (!) 156/69 (!) 156/87 (!) 146/55 (!) 170/67  Pulse: 63 70 67 66  Resp: 18 16 16 16   Temp: 98.2 F (36.8 C) 98.2 F (36.8 C) 97.7 F (36.5 C) 98.3 F (36.8 C)  TempSrc:  Oral Oral Oral  SpO2:  97% 96% 97%  Weight:      Height:         Intake/Output Summary (Last 24 hours) at 07/01/2024 1132 Last data filed at 07/01/2024 1118 Gross per 24 hour  Intake 852.08 ml  Output 402 ml  Net 450.08 ml   Filed Weights   06/28/24 0634 06/29/24 1057 06/30/24 1341  Weight: 100.5 kg 100.5 kg 100.5 kg    Scheduled Meds:  [MAR Hold] sodium chloride    Intravenous Once   [MAR Hold] atorvastatin   40 mg Oral q morning   [MAR Hold] carvedilol   6.25 mg Oral BID WC   [MAR Hold] Chlorhexidine Gluconate Cloth  6 each Topical Q0600   Chlorhexidine Gluconate Cloth  6 each Topical Q0600   [MAR Hold] clopidogrel   75 mg Oral Daily   [MAR Hold] darbepoetin (ARANESP ) injection - DIALYSIS  100 mcg Subcutaneous Q Mon-1800   [MAR Hold] famotidine   20 mg Oral Daily   [MAR Hold] feeding supplement (NEPRO CARB STEADY)  237 mL Oral BID BM   [MAR Hold] insulin  aspart  0-9 Units Subcutaneous TID AC & HS   [MAR Hold] isosorbide  mononitrate  90 mg Oral q morning   [MAR Hold] lamoTRIgine   100 mg Oral BID   [MAR Hold] multivitamin  1 tablet Oral QHS   [MAR Hold] pantoprazole   40 mg Oral BID AC   [MAR Hold] sertraline   100 mg Oral Daily   Continuous Infusions:  [MAR Hold] anticoagulant sodium citrate     lactated ringers       Nutritional status Signs/Symptoms: estimated needs Interventions: MVI, Nepro shake Body mass index is 38.03 kg/m.  Data Reviewed:   CBC: Recent Labs  Lab 06/25/24 2329 06/28/24 0414 06/29/24 0502 06/30/24 0512 07/01/24 0515  WBC 5.8 6.2 5.5 4.5 4.0  NEUTROABS 3.9  --   --   --   --   HGB 9.1* 6.8* 7.7* 7.5* 8.9*  HCT 27.1* 20.7* 23.2* 22.8* 26.7*  MCV 92.2 93.7 91.3 91.9 89.9  PLT 235 152 135* 153 155   Basic Metabolic Panel: Recent Labs  Lab 06/25/24 2329 06/27/24 0426 06/28/24 0246 06/29/24 0502 06/30/24 0512 07/01/24 0515 07/01/24 0516  NA 133*   < > 137 135 137 136 136  K 4.8   < > 5.3* 5.3* 4.4 4.0 4.0  CL 99   < > 102 101 101 100 101  CO2 22   < > 21* 21* 21* 21* 21*  GLUCOSE 238*   < > 165*  159* 111* 144* 144*  BUN 31*   < > 51* 59* 60* 58* 57*  CREATININE 3.40*   < > 6.89* 6.89* 6.97* 6.66* 6.63*  CALCIUM  9.5   < > 8.7* 8.8* 9.0 8.9 9.0  MG 1.9  --   --   --   --  1.9  --   PHOS  --   --  5.7* 6.2* 6.7* 6.6* 6.6*   < > = values in this interval not displayed.   GFR: Estimated Creatinine Clearance: 10.1 mL/min (A) (by C-G formula based on SCr of 6.63 mg/dL (H)). Liver Function Tests: Recent Labs  Lab 06/25/24 2329 06/28/24 0246 06/29/24 0502 06/30/24 9487 07/01/24  0516  AST 10*  --   --   --   --   ALT 11  --   --   --   --   ALKPHOS 82  --   --   --   --   BILITOT 0.5  --   --   --   --   PROT 9.5*  --   --   --   --   ALBUMIN  4.1 3.3* 3.3* 3.3* 3.4*   No results for input(s): LIPASE, AMYLASE in the last 168 hours. No results for input(s): AMMONIA in the last 168 hours. Coagulation Profile: No results for input(s): INR, PROTIME in the last 168 hours. Cardiac Enzymes: No results for input(s): CKTOTAL, CKMB, CKMBINDEX, TROPONINI in the last 168 hours. BNP (last 3 results) Recent Labs    06/25/24 2329  PROBNP 1,087.0*   HbA1C: No results for input(s): HGBA1C in the last 72 hours. CBG: Recent Labs  Lab 06/30/24 1335 06/30/24 1528 06/30/24 1607 06/30/24 2050 07/01/24 0722  GLUCAP 112* 131* 129* 223* 134*   Lipid Profile: No results for input(s): CHOL, HDL, LDLCALC, TRIG, CHOLHDL, LDLDIRECT in the last 72 hours. Thyroid Function Tests: No results for input(s): TSH, T4TOTAL, FREET4, T3FREE, THYROIDAB in the last 72 hours. Anemia Panel: No results for input(s): VITAMINB12, FOLATE, FERRITIN, TIBC, IRON, RETICCTPCT in the last 72 hours. Sepsis Labs: No results for input(s): PROCALCITON, LATICACIDVEN in the last 168 hours.  No results found for this or any previous visit (from the past 240 hours).       Radiology Studies: DG C-Arm 1-60 Min-No Report Result Date: 07/01/2024 Fluoroscopy was  utilized by the requesting physician.  No radiographic interpretation.           LOS: 5 days   Time spent= 35 mins    Burgess JAYSON Dare, MD [facility]  If 7PM-7AM, please contact night-coverage  07/01/2024, 11:32 AM

## 2024-07-01 NOTE — Progress Notes (Signed)
 Ecchymosis noted to bilateral upper eye lids.  CRNA and MD assessed. Believed to have been caused by tape closing the eyes during procedure. No complaints of pain and pt states she has had them do that before.

## 2024-07-01 NOTE — Interval H&P Note (Signed)
 History and Physical Interval Note:  07/01/2024 9:53 AM  [name]  has presented today for surgery, with the diagnosis of tunneled dialysis catheter.  The various methods of treatment have been discussed with the patient and family. After consideration of risks, benefits and other options for treatment, the patient has consented to  Procedure(s): INSERTION OF DIALYSIS CATHETER, tunneled catheter (Right) as a surgical intervention.  The patient's history has been reviewed, patient examined, no change in status, stable for surgery.  I have reviewed the patient's chart and labs.  Questions were answered to the patient's satisfaction.    US  used to assess vein. Vein open.  [name] JAYSON Pander

## 2024-07-01 NOTE — Anesthesia Procedure Notes (Signed)
 Procedure Name: LMA Insertion Date/Time: 07/01/2024 10:52 AM  Performed by: Cordella Elvie HERO, CRNAPre-anesthesia Checklist: Patient identified, Emergency Drugs available, Suction available, Patient being monitored and Timeout performed Patient Re-evaluated:Patient Re-evaluated prior to induction Oxygen  Delivery Method: Circle system utilized Preoxygenation: Pre-oxygenation with 100% oxygen  Induction Type: IV induction LMA: LMA with gastric port inserted LMA Size: 4.0 Number of attempts: 1 Tube secured with: Tape Dental Injury: Teeth and Oropharynx as per pre-operative assessment

## 2024-07-01 NOTE — Progress Notes (Signed)
 Admit: 06/25/2024 LOS: 5  17F AKI on CKD5, AoC Anemia, recurrent chest pain with history of ASCVD/CAD.   Subjective:  Consulted surgery on 10/15 for tunneled HD catheter placement today.  Per HD SW, they have initiated process for outpatient HD unit placement.  She had 400 mL uop over 10/15.  She had an EGD with normal esophagus and some changes c/w portal hypertensive gastropathy.   She was brought down for her tunneled catheter placement - hasn't had it yet.     Review of systems:    She denies shortness of breath  She denies n/v She denies current chest pain but states that she had chest pain overnight     10/15 0701 - 10/16 0700 In: 552.1 [I.V.:200; Blood:352.1] Out: 400 [Urine:400]  Filed Weights   06/28/24 0634 06/29/24 1057 06/30/24 1341  Weight: 100.5 kg 100.5 kg 100.5 kg    Scheduled Meds:  [MAR Hold] sodium chloride    Intravenous Once   [MAR Hold] atorvastatin   40 mg Oral q morning   [MAR Hold] carvedilol   6.25 mg Oral BID WC   [MAR Hold] Chlorhexidine Gluconate Cloth  6 each Topical Q0600   [MAR Hold] clopidogrel   75 mg Oral Daily   [MAR Hold] darbepoetin (ARANESP ) injection - DIALYSIS  100 mcg Subcutaneous Q Mon-1800   [MAR Hold] famotidine   20 mg Oral Daily   [MAR Hold] feeding supplement (NEPRO CARB STEADY)  237 mL Oral BID BM   [MAR Hold] insulin  aspart  0-9 Units Subcutaneous TID AC & HS   [MAR Hold] isosorbide  mononitrate  90 mg Oral q morning   [MAR Hold] lamoTRIgine   100 mg Oral BID   [MAR Hold] multivitamin  1 tablet Oral QHS   [MAR Hold] pantoprazole   40 mg Oral BID AC   [MAR Hold] sertraline   100 mg Oral Daily   Continuous Infusions:  [MAR Hold] anticoagulant sodium citrate      ceFAZolin (ANCEF) IV     PRN Meds:.[MAR Hold] acetaminophen , [MAR Hold] alteplase, [MAR Hold] anticoagulant sodium citrate, [MAR Hold] clonazePAM , [MAR Hold] diphenhydrAMINE -zinc acetate, [MAR Hold] fentaNYL  (SUBLIMAZE ) injection, [MAR Hold] glucagon (human recombinant),  [MAR Hold] hydrALAZINE , [MAR Hold] ipratropium-albuterol , [MAR Hold] lidocaine  (PF), [MAR Hold] lidocaine -prilocaine, [MAR Hold] mouth rinse, [MAR Hold] pentafluoroprop-tetrafluoroeth, [MAR Hold] senna-docusate  Current Labs: reviewed  Lab Results  Component Value Date   NA 136 07/01/2024   CL 101 07/01/2024   K 4.0 07/01/2024   CO2 21 (L) 07/01/2024   BUN 57 (H) 07/01/2024   CREATININE 6.63 (H) 07/01/2024   GFRNONAA 7 (L) 07/01/2024   CALCIUM  9.0 07/01/2024   PHOS 6.6 (H) 07/01/2024   ALBUMIN  3.4 (L) 07/01/2024   GLUCOSE 144 (H) 07/01/2024     10/14 RFP: K5.3, bicarbonate 21, creatinine 6.9, BUN 59 consistent with stable unchanged AKI 10/14 CBC: Hemoglobin to 7.7 after 1 unit PRBC yesterday WBC and platelet count remain stable 10/13 iron panel: TSAT 15%, ferritin 326   Physical Exam:  Blood pressure (!) 170/67, pulse 66, temperature 98.3 F (36.8 C), temperature source Oral, resp. rate 16, height 5' 4 (1.626 m), weight 100.5 kg, SpO2 97%.  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  Specific etiology for AKI is unclear.  creatinine baseline in August was 5.3 - 5.6.  Her baseline GFR has been largely under 10 for the past year (limited data between end of Sept 2024 and present admission).  No obvious nephrotoxin exposure or identified threat to volume status.  Perhaps related to worsening of anemia.  She has had some nausea and still has had some chest pain We are starting HD today after tunneled catheter placement.   Appreciate surgery - Dr. Kallie  Plan for HD tomorrow as well  I have reached out to HD SW to initiate CLIP process to get an outpatient HD unit.  Appreciate Abigail  Acute on chronic anemia in the setting of anemia of CKD: Transfused 1 unit PRBC 10/13 with appropriate increase in hemoglobin.  TSAT 15% and ferritin 326.  Note Aranesp  100 mcg given 10/13.   Recurrent chest pain with significant history of CAD and PCI: Troponins reassuring.  Cardiology following, currently medical management.  Spoke with cardiology to update them of plans for HD Mild hyperkalemia: s/p lokelma.  improved HTN - starting HD. On imdur  and beta blocker. No signs of overload Longstanding DM2 with microvascular disease - per primary team History of GI bleed and A-fib on apixaban  at presentation. Was held for anemia.  S/p EGD with GI without acute findings   Other Medication Recs: Preferred narcotic agents for pain control are hydromorphone , fentanyl , and methadone. Morphine  should not be used.  Baclofen should be avoided Avoid oral sodium phosphate and magnesium  citrate based laxatives / bowel preps   Disposition - continue inpatient monitoring; needs an access for HD and an outpatient HD unit from a strictly renal standpoint   Recent Labs  Lab 06/30/24 0512 07/01/24 0515 07/01/24 0516  NA 137 136 136  K 4.4 4.0 4.0  CL 101 100 101  CO2 21* 21* 21*  GLUCOSE 111* 144* 144*  BUN 60* 58* 57*  CREATININE 6.97* 6.66* 6.63*  CALCIUM  9.0 8.9 9.0  PHOS 6.7* 6.6* 6.6*   Recent Labs  Lab 06/25/24 2329 06/28/24 0414 06/29/24 0502 06/30/24 0512 07/01/24 0515  WBC 5.8   < > 5.5 4.5 4.0  NEUTROABS 3.9  --   --   --   --   HGB 9.1*   < > 7.7* 7.5* 8.9*  HCT 27.1*   < > 23.2* 22.8* 26.7*  MCV 92.2   < > 91.3 91.9 89.9  PLT 235   < > 135* 153 155   < > = values in this interval not displayed.     Katheryn JAYSON Saba, MD 9:55 AM 07/01/2024

## 2024-07-01 NOTE — Telephone Encounter (Signed)
 Hi Amanda,  Can you please schedule a follow up appointment for this patient in 2-3 weeks with Dr. Cindie or any of the APPs?  Thanks,  Toribio Fortune, MD Gastroenterology and Hepatology [facility]

## 2024-07-01 NOTE — Procedures (Signed)
 Received patient in bed to unit.  Alert and oriented.  Informed consent signed and in chart.  All procedures explained. RIJ tunneled dual lumen catheter accessed per policy. Tx initiated per MD order. UF goal 0 ml.  TX duration:2 hours  Tx complete. Blood returned. Dual lumen catheter flushed with NS, heparin  left to dwell. Caps and clamps in place.  Patient tolerated well.  Transported back to the room  Alert, without acute distress.  Hand-off given to patient's nurse.   Access used: RIJ tunneled dual lumen catheter. Access issues: none  Total UF removed: 0 Medication(s) given: See MAR   [facility] LITTIE [facility]

## 2024-07-01 NOTE — Progress Notes (Addendum)
 [facility]  Updated her sister. Patient had tape on her eyelids for anesthesia. Looks like this caused some bruising to the eyelid likely due to the plavix . Left eyelid worse than right eyelid. Can do some ice to the eye lid as needed.   Catheter site looks good. CXR with catheter in good position, no PTX.  [name] Pander, MD [facility] [address] [place] [address] [place], [place] [address] [phone] (office)

## 2024-07-01 NOTE — Progress Notes (Signed)
     Patient currently in the OR for dialysis catheter placement. Reviewed with Dr. Shlomo and no new cardiac recommendations at this time.   Signed, Laymon CHRISTELLA Qua, PA-C 07/01/2024, 9:41 AM Pager: [phone]

## 2024-07-01 NOTE — Anesthesia Preprocedure Evaluation (Signed)
 Anesthesia Evaluation  Patient identified by MRN, date of birth, ID band Patient awake    Reviewed: Allergy & Precautions, H&P , NPO status , Patient's Chart, lab work & pertinent test results, reviewed documented beta blocker date and time   Airway Mallampati: II  TM Distance: >3 FB Neck ROM: full    Dental no notable dental hx.    Pulmonary asthma    Pulmonary exam normal breath sounds clear to auscultation       Cardiovascular Exercise Tolerance: Good hypertension, + angina  + CAD and +CHF   Rhythm:regular Rate:Normal     Neuro/Psych  Headaches PSYCHIATRIC DISORDERS Anxiety Depression Bipolar Disorder      GI/Hepatic Neg liver ROS,GERD  ,,  Endo/Other  diabetes  Class 4 obesity  Renal/GU Renal disease  negative genitourinary   Musculoskeletal   Abdominal   Peds  Hematology  (+) Blood dyscrasia, anemia   Anesthesia Other Findings   Reproductive/Obstetrics negative OB ROS                              Anesthesia Physical Anesthesia Plan  ASA: 4 and emergent  Anesthesia Plan: General   Post-op Pain Management:    Induction:   PONV Risk Score and Plan: Propofol infusion  Airway Management Planned:   Additional Equipment:   Intra-op Plan:   Post-operative Plan:   Informed Consent: I have reviewed the patients History and Physical, chart, labs and discussed the procedure including the risks, benefits and alternatives for the proposed anesthesia with the patient or authorized representative who has indicated his/her understanding and acceptance.     Dental Advisory Given  Plan Discussed with: CRNA  Anesthesia Plan Comments:         Anesthesia Quick Evaluation

## 2024-07-02 ENCOUNTER — Encounter (HOSPITAL_COMMUNITY): Payer: Self-pay | Admitting: Gastroenterology

## 2024-07-02 DIAGNOSIS — R079 Chest pain, unspecified: Secondary | ICD-10-CM | POA: Diagnosis not present

## 2024-07-02 DIAGNOSIS — I2510 Atherosclerotic heart disease of native coronary artery without angina pectoris: Secondary | ICD-10-CM | POA: Diagnosis not present

## 2024-07-02 DIAGNOSIS — I480 Paroxysmal atrial fibrillation: Secondary | ICD-10-CM | POA: Diagnosis not present

## 2024-07-02 DIAGNOSIS — E782 Mixed hyperlipidemia: Secondary | ICD-10-CM | POA: Diagnosis not present

## 2024-07-02 DIAGNOSIS — I10 Essential (primary) hypertension: Secondary | ICD-10-CM | POA: Diagnosis not present

## 2024-07-02 LAB — TYPE AND SCREEN
ABO/RH(D): O POS
Antibody Screen: POSITIVE
DAT, IgG: NEGATIVE
Donor AG Type: NEGATIVE
Donor AG Type: NEGATIVE
Donor AG Type: NEGATIVE
Donor AG Type: NEGATIVE
PT AG Type: NEGATIVE
Unit division: 0
Unit division: 0
Unit division: 0
Unit division: 0

## 2024-07-02 LAB — BPAM RBC
Blood Product Expiration Date: 202510262359
Blood Product Expiration Date: 202510312359
Blood Product Expiration Date: 202511022359
Blood Product Expiration Date: 202511122359
ISSUE DATE / TIME: 202510131709
ISSUE DATE / TIME: 202510160135
Unit Type and Rh: 5100
Unit Type and Rh: 5100
Unit Type and Rh: 5100
Unit Type and Rh: 5100

## 2024-07-02 LAB — RENAL FUNCTION PANEL
Albumin: 3.3 g/dL — ABNORMAL LOW (ref 3.5–5.0)
Anion gap: 11 (ref 5–15)
BUN: 35 mg/dL — ABNORMAL HIGH (ref 8–23)
CO2: 26 mmol/L (ref 22–32)
Calcium: 8.8 mg/dL — ABNORMAL LOW (ref 8.9–10.3)
Chloride: 100 mmol/L (ref 98–111)
Creatinine, Ser: 5.13 mg/dL — ABNORMAL HIGH (ref 0.44–1.00)
GFR, Estimated: 9 mL/min — ABNORMAL LOW (ref >60)
Glucose, Bld: 143 mg/dL — ABNORMAL HIGH (ref 70–99)
Phosphorus: 4.8 mg/dL — ABNORMAL HIGH (ref 2.5–4.6)
Potassium: 3.7 mmol/L (ref 3.5–5.1)
Sodium: 137 mmol/L (ref 135–145)

## 2024-07-02 LAB — TISSUE TRANSGLUTAMINASE, IGA: Tissue Transglutaminase Ab, IgA: <2 U/mL (ref 0–3)

## 2024-07-02 LAB — GLUCOSE, CAPILLARY
Glucose-Capillary: 157 mg/dL — ABNORMAL HIGH (ref 70–99)
Glucose-Capillary: 191 mg/dL — ABNORMAL HIGH (ref 70–99)
Glucose-Capillary: 221 mg/dL — ABNORMAL HIGH (ref 70–99)

## 2024-07-02 LAB — BASIC METABOLIC PANEL WITH GFR
Anion gap: 12 (ref 5–15)
BUN: 35 mg/dL — ABNORMAL HIGH (ref 8–23)
CO2: 26 mmol/L (ref 22–32)
Calcium: 8.7 mg/dL — ABNORMAL LOW (ref 8.9–10.3)
Chloride: 100 mmol/L (ref 98–111)
Creatinine, Ser: 5.43 mg/dL — ABNORMAL HIGH (ref 0.44–1.00)
GFR, Estimated: 8 mL/min — ABNORMAL LOW (ref >60)
Glucose, Bld: 145 mg/dL — ABNORMAL HIGH (ref 70–99)
Potassium: 3.6 mmol/L (ref 3.5–5.1)
Sodium: 137 mmol/L (ref 135–145)

## 2024-07-02 LAB — CBC
HCT: 26.8 % — ABNORMAL LOW (ref 36.0–46.0)
Hemoglobin: 8.9 g/dL — ABNORMAL LOW (ref 12.0–15.0)
MCH: 30.4 pg (ref 26.0–34.0)
MCHC: 33.2 g/dL (ref 30.0–36.0)
MCV: 91.5 fL (ref 80.0–100.0)
Platelets: 165 K/uL (ref 150–400)
RBC: 2.93 MIL/uL — ABNORMAL LOW (ref 3.87–5.11)
RDW: 16.2 % — ABNORMAL HIGH (ref 11.5–15.5)
WBC: 4.1 K/uL (ref 4.0–10.5)
nRBC: 0 % (ref 0.0–0.2)

## 2024-07-02 LAB — MAGNESIUM: Magnesium: 1.9 mg/dL (ref 1.7–2.4)

## 2024-07-02 LAB — HEPATITIS B CORE ANTIBODY, TOTAL: HEP B CORE AB: NEGATIVE

## 2024-07-02 MED ORDER — PROCHLORPERAZINE EDISYLATE 10 MG/2ML IJ SOLN
10.0000 mg | Freq: Four times a day (QID) | INTRAMUSCULAR | Status: DC | PRN
  Administered 2024-07-02 – 2024-07-04 (×3): 10 mg via INTRAVENOUS
  Filled 2024-07-02 (×4): qty 2

## 2024-07-02 MED ORDER — HEPARIN SODIUM (PORCINE) 1000 UNIT/ML IJ SOLN
INTRAMUSCULAR | Status: AC
  Filled 2024-07-02: qty 4

## 2024-07-02 NOTE — Progress Notes (Signed)
 PROGRESS NOTE    [name]  FMW:[id number] DOB: 08-15-1961 DOA: 06/25/2024 PCP: System, Provider Not In   Brief Hospital admission narrative: As per H&P written by Dr. Manfred on 06/26/2024 [name] is a 63 y.o. female with medical history significant of generalized anxiety disorder with panic attacks, asthma. Chronic chest pain  CAD, STEMI with PCI x . GI bleed, DVT, Bipolar disorder, DM 2, neuropathy, hyperlipidemia, hypertension, GERD, CKD V, gastroparesis, anemia of chronic disease and a fib. Patient reports over last 5-6 months has had worsening renal function has experienced dec,line in mobility tolerance, strength and dizziness and dyspnea when she gets up.  (Utilizes rolling seat walker with assistance from family members for home mobility)now progressed to stage 5. She is followed  with Dr Fernand nephrology in [place] .  This past Wednesday she experienced chest pain  accompanied by diaphoresis and nausea. Chest pain described left side of her chest and her left arm. This event occurred again today and her nephrologist referred her to come here to [facility] as patient has appointment on 18th. Workup in ED troponin flat 33-30, chest pain resolved with fentanyl  (unable to tolerate nitroglycerin ),  BNP 1087 with normal respirations and sats on room air. Last ECHO reviewed in EPIC 05/06/2024 EF 60-65%, LVH grade I diastolic dysfunction  Assessment and plan:  Acute kidney injury on chronic kidney disease stage V.  Patient is now ESRD. Patient's creatinine has fluctuated from 3.0-7.  -Status post right IJ hemodialysis catheter placement on 07/01/2024 and first dialysis treatment performed after that. - Patient tolerated treatment well - Planning for second hemodialysis treatment later today. -Renal ultrasound : negative - Continue to follow nephrology service recommendations and further management.  Chest pain/CAD/STEMI with PCI hx -Prior history of PCI in [place] .  Has had ongoing  history of off-and-on chest pain.  - Troponin has remained flat and no suggesting acute ischemic changes - EKG also without abnormalities. - Cardiology service recommending no cardiac catheter ischemic evaluation throughout this hospitalization. - Echo demonstrating preserved ejection fraction and no wall motion abnormalities (reassuring).  - Continue treatment with Plavix , statin, Coreg , and Imdur .     Elevated pro BNP  -No obvious evidence of exacerbation noted.   - No shortness of breath or orthopnea - Will continue closely monitoring patient's volume management and adjusting it with hemodialysis.   Acute anemia GI bleed - Hemoglobin down to 6.8 underwent EGD showing normal esophagus but had large amount of solid food therefore repeated on 10/15 showing normal esophagus, possible consistent change with portal hypertensive gastropathy status post biopsy.  Recommend outpatient follow-up and possible ileocolonoscopy and possible VCE -PPI twice daily - PRBC transfusion : 10/13, 10/15.  -Hemoglobin now stable at 8.9. -IV iron and Epogen therapy as per nephrology discretion.   A fib Paroxysmal -Patient is in normal sinus rhythm.   - Continue management with carvedilol . - Will continue to hold Eliquis  for now.     Type 2 diabetes mellitus with hyperglycemia A1c 6.5.  - Continue to follow CBG fluctuation and adjust hypoglycemic regimen as required. - Continue sliding scale insulin .    Hyperkalemia -Treated with Lokelma -Continue to follow ultralights and further adjust management with hemodialysis.   GAD/Bipolar -Continue lamictal , sertraline , klonopin  -Has planned psych appointment in 08/2024 - No suicidal ideation or hallucination.     Diet Orders (From admission, onward)     Start     Ordered   07/01/24 1525  Diet renal/carb modified with fluid restriction  Fluid restriction: 1200 mL Fluid; Room service appropriate? Yes; Fluid consistency: Thin  Diet effective now        Question Answer Comment  Fluid restriction: 1200 mL Fluid   Room service appropriate? Yes   Fluid consistency: Thin      07/01/24 1524            Objective: Vitals:   07/02/24 1545 07/02/24 1600 07/02/24 1612 07/02/24 1615  BP: 137/66 (!) 143/71 130/71 138/73  Pulse: 72 73 76 77  Resp: 18 13 16 15   Temp:    98.5 F (36.9 C)  TempSrc:      SpO2: 98% 98% 99% 99%  Weight:    98.4 kg  Height:       General exam: Alert, awake, oriented x 3; in no acute distress.  Reporting some right-sided tenderness around right IJ catheter. Respiratory system: Good saturation on room air. Cardiovascular system: Rate controlled, no rubs, no gallops, no JVD on exam. Gastrointestinal system: Abdomen is obese, nondistended, soft and nontender. No organomegaly or masses felt. Normal bowel sounds heard. Central nervous system: No focal neurological deficits. Extremities: No cyanosis or clubbing.  No edema. Skin: No petechiae; left thigh/purpura with some bruises.  Right IJ tunnel HD catheter in place. Psychiatry: Judgement and insight appear normal. Mood & affect appropriate.      Intake/Output Summary (Last 24 hours) at 07/02/2024 1748 Last data filed at 07/02/2024 1615 Gross per 24 hour  Intake 600 ml  Output 700 ml  Net -100 ml   Filed Weights   07/01/24 1500 07/02/24 1335 07/02/24 1615  Weight: 99.8 kg 99.2 kg 98.4 kg    Scheduled Meds:  sodium chloride    Intravenous Once   atorvastatin   40 mg Oral q morning   carvedilol   6.25 mg Oral BID WC   Chlorhexidine Gluconate Cloth  6 each Topical Q0600   Chlorhexidine Gluconate Cloth  6 each Topical Q0600   clopidogrel   75 mg Oral Daily   darbepoetin (ARANESP ) injection - DIALYSIS  100 mcg Subcutaneous Q Mon-1800   famotidine   20 mg Oral Daily   feeding supplement (NEPRO CARB STEADY)  237 mL Oral BID BM   insulin  aspart  0-9 Units Subcutaneous TID AC & HS   isosorbide  mononitrate  90 mg Oral q morning   lamoTRIgine   100 mg Oral BID    multivitamin  1 tablet Oral QHS   pantoprazole   40 mg Oral BID AC   sertraline   100 mg Oral Daily   Continuous Infusions:  anticoagulant sodium citrate      Nutritional status Signs/Symptoms: estimated needs Interventions: MVI, Nepro shake Body mass index is 37.24 kg/m.  Data Reviewed:   CBC: Recent Labs  Lab 06/25/24 2329 06/28/24 0414 06/29/24 0502 06/30/24 0512 07/01/24 0515 07/02/24 0446  WBC 5.8 6.2 5.5 4.5 4.0 4.1  NEUTROABS 3.9  --   --   --   --   --   HGB 9.1* 6.8* 7.7* 7.5* 8.9* 8.9*  HCT 27.1* 20.7* 23.2* 22.8* 26.7* 26.8*  MCV 92.2 93.7 91.3 91.9 89.9 91.5  PLT 235 152 135* 153 155 165   Basic Metabolic Panel: Recent Labs  Lab 06/25/24 2329 06/27/24 0426 06/29/24 0502 06/30/24 0512 07/01/24 0515 07/01/24 0516 07/02/24 0446 07/02/24 0447  NA 133*   < > 135 137 136 136 137 137  K 4.8   < > 5.3* 4.4 4.0 4.0 3.6 3.7  CL 99   < > 101 101 100 101 100  100  CO2 22   < > 21* 21* 21* 21* 26 26  GLUCOSE 238*   < > 159* 111* 144* 144* 145* 143*  BUN 31*   < > 59* 60* 58* 57* 35* 35*  CREATININE 3.40*   < > 6.89* 6.97* 6.66* 6.63* 5.43* 5.13*  CALCIUM  9.5   < > 8.8* 9.0 8.9 9.0 8.7* 8.8*  MG 1.9  --   --   --  1.9  --  1.9  --   PHOS  --    < > 6.2* 6.7* 6.6* 6.6*  --  4.8*   < > = values in this interval not displayed.   GFR: Estimated Creatinine Clearance: 13 mL/min (A) (by C-G formula based on SCr of 5.13 mg/dL (H)).  Liver Function Tests: Recent Labs  Lab 06/25/24 2329 06/28/24 0246 06/29/24 0502 06/30/24 0512 07/01/24 0516 07/02/24 0447  AST 10*  --   --   --   --   --   ALT 11  --   --   --   --   --   ALKPHOS 82  --   --   --   --   --   BILITOT 0.5  --   --   --   --   --   PROT 9.5*  --   --   --   --   --   ALBUMIN  4.1 3.3* 3.3* 3.3* 3.4* 3.3*   BNP (last 3 results) Recent Labs    06/25/24 2329  PROBNP 1,087.0*   HbA1C: No results for input(s): HGBA1C in the last 72 hours.  CBG: Recent Labs  Lab 07/01/24 1202  07/01/24 1634 07/01/24 2110 07/02/24 0709 07/02/24 1122  GLUCAP 150* 129* 151* 157* 191*   Radiology Studies: DG Chest Port 1 View Result Date: 07/01/2024 EXAM: 1 VIEW XRAY OF THE CHEST 07/01/2024 11:49:00 AM COMPARISON: 06/27/2024 CLINICAL HISTORY: [id number] Vascular dialysis catheter in place [id number]. S/p dialysis cath placement. FINDINGS: LINES, TUBES AND DEVICES: Right internal jugular dialysis catheter with distal tip in the SVC (superior vena cava). LUNGS AND PLEURA: No focal pulmonary opacity. No pulmonary edema. No pleural effusion. No pneumothorax. HEART AND MEDIASTINUM: No acute abnormality of the cardiac and mediastinal silhouettes. BONES AND SOFT TISSUES: No acute osseous abnormality. IMPRESSION: 1. Right internal jugular dialysis catheter with distal tip in the superior vena cava. 2. No pneumothorax. Electronically signed by: Lynwood Seip MD 07/01/2024 12:20 PM EDT RP Workstation: [id number]   DG C-Arm 1-60 Min-No Report Result Date: 07/01/2024 Fluoroscopy was utilized by the requesting physician.  No radiographic interpretation.     LOS: 6 days   Time spent= 35 mins    Eric Nunnery, MD [facility]  If 7PM-7AM, please contact night-coverage  07/02/2024, 5:48 PM

## 2024-07-02 NOTE — Progress Notes (Signed)
 Admit: 06/25/2024 LOS: 6  39F AKI on CKD5, AoC Anemia, recurrent chest pain with history of ASCVD/CAD.   Subjective:  Tunneled HD catheter was placed on 10/16.  She had first HD tx after that with zero kg UF.  She had 200 mL uop over 10/16.  She states that dialysis went ok.    Review of systems:      She denies shortness of breath  She has had nausea and vomiting this AM She denies current chest pain; some soreness at catheter site post-procedure     10/16 0701 - 10/17 0700 In: 880 [P.O.:480; I.V.:300; IV Piggyback:100] Out: 204 [Urine:200; Blood:4]  Filed Weights   06/30/24 1341 07/01/24 1246 07/01/24 1500  Weight: 100.5 kg 99.8 kg 99.8 kg    Scheduled Meds:  sodium chloride    Intravenous Once   atorvastatin   40 mg Oral q morning   carvedilol   6.25 mg Oral BID WC   Chlorhexidine Gluconate Cloth  6 each Topical Q0600   Chlorhexidine Gluconate Cloth  6 each Topical Q0600   clopidogrel   75 mg Oral Daily   darbepoetin (ARANESP ) injection - DIALYSIS  100 mcg Subcutaneous Q Mon-1800   famotidine   20 mg Oral Daily   feeding supplement (NEPRO CARB STEADY)  237 mL Oral BID BM   insulin  aspart  0-9 Units Subcutaneous TID AC & HS   isosorbide  mononitrate  90 mg Oral q morning   lamoTRIgine   100 mg Oral BID   multivitamin  1 tablet Oral QHS   pantoprazole   40 mg Oral BID AC   sertraline   100 mg Oral Daily   Continuous Infusions:  anticoagulant sodium citrate     PRN Meds:.acetaminophen , alteplase, anticoagulant sodium citrate, clonazePAM , diphenhydrAMINE -zinc acetate, fentaNYL  (SUBLIMAZE ) injection, glucagon (human recombinant), hydrALAZINE , ipratropium-albuterol , lidocaine  (PF), lidocaine -prilocaine, mouth rinse, oxyCODONE , pentafluoroprop-tetrafluoroeth, prochlorperazine , senna-docusate  Current Labs: reviewed  Lab Results  Component Value Date   NA 137 07/02/2024   CL 100 07/02/2024   K 3.7 07/02/2024   CO2 26 07/02/2024   BUN 35 (H) 07/02/2024   CREATININE 5.13 (H)  07/02/2024   GFRNONAA 9 (L) 07/02/2024   CALCIUM  8.8 (L) 07/02/2024   PHOS 4.8 (H) 07/02/2024   ALBUMIN  3.3 (L) 07/02/2024   GLUCOSE 143 (H) 07/02/2024     10/14 RFP: K5.3, bicarbonate 21, creatinine 6.9, BUN 59 consistent with stable unchanged AKI 10/14 CBC: Hemoglobin to 7.7 after 1 unit PRBC yesterday WBC and platelet count remain stable 10/13 iron panel: TSAT 15%, ferritin 326   Physical Exam:  Blood pressure 116/71, pulse 69, temperature 98 F (36.7 C), temperature source Oral, resp. rate 18, height 5' 4 (1.626 m), weight 99.8 kg, SpO2 98%.  General adult female in bed in no acute  distress   HEENT normocephalic atraumatic extraocular movements intact sclera anicteric Neck supple trachea midline Lungs clear to auscultation bilaterally normal work of breathing at rest on room air  Heart S1S2 no rub Abdomen soft nontender nondistended Extremities no edema  Psych normal mood and affect Neuro alert and oriented x3 provides hx and follows commands  Access RIJ tunneled catheter in place    Assessment and plan:    AKI on CKD5 with progression to ESRD CKD due to diabetic nephropathy.  Specific etiology for AKI is unclear.  creatinine baseline in August was 5.3 - 5.6.  Her baseline GFR has been largely under 10 for the past year (limited data between end of Sept 2024 and present admission).  No obvious nephrotoxin exposure  or identified threat to volume status.  Perhaps related to worsening of anemia.  She has had some nausea and still has had some chest pain.  First HD on 10/16 after tunneled catheter with surgery  HD today  I have reached out to HD SW to initiate CLIP process to get an outpatient HD unit.  Appreciate Abigail  Acute on chronic anemia in the setting of anemia of CKD: Transfused 1 unit PRBC 10/13 with appropriate increase in hemoglobin.  TSAT 15% and ferritin 326.  Note Aranesp  100 mcg given 10/13 and is currently ordered for every Monday Recurrent chest pain with  significant history of CAD and PCI: Troponins reassuring.  Cardiology following, currently medical management.  Spoke with cardiology to update them of plans for HD Mild hyperkalemia: s/p lokelma.  improved HTN - starting HD. On imdur  and beta blocker. No signs of overload. BP improved.  Longstanding DM2 with microvascular disease - per primary team History of GI bleed and A-fib on apixaban  at presentation. Anticoagulation was prev held for anemia.  S/p EGD with GI without acute findings.  Per primary team and cardiology   Other Medication Recs: Preferred narcotic agents for pain control are hydromorphone , fentanyl , and methadone. Morphine  should not be used.  Baclofen should be avoided Avoid oral sodium phosphate and magnesium  citrate based laxatives / bowel preps   Disposition - continue inpatient monitoring; still needs an outpatient HD unit from a strictly renal standpoint.  At this time, patient will not be physically seen by nephrology over the weekend - nephrology will review her chart and labs.  Please do not hesitate to reach out with any questions or if her clinical status changes    Recent Labs  Lab 07/01/24 0515 07/01/24 0516 07/02/24 0446 07/02/24 0447  NA 136 136 137 137  K 4.0 4.0 3.6 3.7  CL 100 101 100 100  CO2 21* 21* 26 26  GLUCOSE 144* 144* 145* 143*  BUN 58* 57* 35* 35*  CREATININE 6.66* 6.63* 5.43* 5.13*  CALCIUM  8.9 9.0 8.7* 8.8*  PHOS 6.6* 6.6*  --  4.8*   Recent Labs  Lab 06/25/24 2329 06/28/24 0414 06/30/24 0512 07/01/24 0515 07/02/24 0446  WBC 5.8   < > 4.5 4.0 4.1  NEUTROABS 3.9  --   --   --   --   HGB 9.1*   < > 7.5* 8.9* 8.9*  HCT 27.1*   < > 22.8* 26.7* 26.8*  MCV 92.2   < > 91.9 89.9 91.5  PLT 235   < > 153 155 165   < > = values in this interval not displayed.     Katheryn JAYSON Saba, MD 10:09 AM 07/02/2024

## 2024-07-02 NOTE — Progress Notes (Signed)
 Mobility Specialist Progress Note:    07/02/24 1240  Mobility  Activity Pivoted/transferred from bed to chair  Level of Assistance Independent  Assistive Device None  Distance Ambulated (ft) 3 ft  Range of Motion/Exercises Active;All extremities  Activity Response Tolerated well  Mobility Referral Yes  Mobility visit 1 Mobility  Mobility Specialist Start Time (ACUTE ONLY) 1240  Mobility Specialist Stop Time (ACUTE ONLY) 1255  Mobility Specialist Time Calculation (min) (ACUTE ONLY) 15 min   Pt received in bed, agreeable to sit up for lunch. Independently able to stand and transfer with no AD. Tolerated well, asx throughout. Call bell in reach, all needs met.   Omni Dunsworth Mobility Specialist Please contact via Special educational needs teacher or  Rehab office at [phone]

## 2024-07-02 NOTE — Progress Notes (Addendum)
 Nutrition Follow-up  DOCUMENTATION CODES:   Obesity unspecified  INTERVENTION:   Provided Nutrition for Dialysis handout from [facility]; she will also have access to outpatient RD at HD center  Continue Nepro Shake po BID, each supplement provides 425 kcal and 19 grams protein. Continue renal MVI daily.  NUTRITION DIAGNOSIS:   Increased nutrient needs related to chronic illness (ESRD on HD) as evidenced by estimated needs.  Ongoing   GOAL:   Patient will meet greater than or equal to 90% of their needs  Progressing   MONITOR:   PO intake, Supplement acceptance, Diet advancement  REASON FOR ASSESSMENT:   Consult Assessment of nutrition requirement/status, Diet education  ASSESSMENT:   63 y.o. female with medical history significant of generalized anxiety disorder with panic attacks, asthma. Chronic chest pain  CAD, STEMI with PCI x . GI bleed, DVT, Bipolar disorder, DM 2, neuropathy, hyperlipidemia, hypertension, GERD, CKD V, gastroparesis, anemia of chronic disease and a fib.  10/15: Upper endoscopy showed concerns for possible portal hypertensive gastropathy; S/P biopsy 10/16: S/P tunneled HD catheter placement  Visited patient at bedside. She woke up briefly, but was unable to stay awake long enough for education. RD left Renal diet education handouts at bedside. Plans for HD today. Outpatient HD is being set up as well.   She is currently on a renal / carb modified diet. Meal intakes: 25% of breakfast today  Labs reviewed. Phos 4.8 CBG: 157-191  Medications reviewed and include aranesp , pepcid , novolog , renal MVI, protonix .   NUTRITION - FOCUSED PHYSICAL EXAM:  Flowsheet Row Most Recent Value  Orbital Region No depletion  Upper Arm Region No depletion  Thoracic and Lumbar Region No depletion  Buccal Region No depletion  Temple Region No depletion  Clavicle Bone Region No depletion  Clavicle and Acromion Bone Region No  depletion  Scapular Bone Region No depletion  Dorsal Hand No depletion  Patellar Region No depletion  Anterior Thigh Region No depletion  Posterior Calf Region No depletion  Edema (RD Assessment) Mild  Hair Reviewed  Eyes Reviewed  Mouth Unable to assess  Skin Reviewed  Nails Reviewed    Diet Order:   Diet Order             Diet renal/carb modified with fluid restriction Fluid restriction: 1200 mL Fluid; Room service appropriate? Yes; Fluid consistency: Thin  Diet effective now                   EDUCATION NEEDS:   No education needs have been identified at this time  Skin:  Skin Assessment: Reviewed RN Assessment  Last BM:  10/15 type 6  Height:   Ht Readings from Last 1 Encounters:  06/30/24 5' 4 (1.626 m)    Weight:   Wt Readings from Last 1 Encounters:  07/01/24 99.8 kg    Ideal Body Weight:  54.5 kg  BMI:  Body mass index is 37.77 kg/m.  Estimated Nutritional Needs:   Kcal:  1700-1900  Protein:  85-100 grams  Fluid:  1000 ml + UOP   Suzen HUNT RD, LDN, CNSC Contact via secure chat. If unavailable, use group chat RD Inpatient.

## 2024-07-02 NOTE — Plan of Care (Signed)
  Problem: Education: Goal: Ability to describe self-care measures that may prevent or decrease complications (Diabetes Survival Skills Education) will improve Outcome: Progressing   Problem: Coping: Goal: Ability to adjust to condition or change in health will improve Outcome: Progressing   Problem: Fluid Volume: Goal: Ability to maintain a balanced intake and output will improve Outcome: Progressing   Problem: Health Behavior/Discharge Planning: Goal: Ability to identify and utilize available resources and services will improve Outcome: Progressing Goal: Ability to manage health-related needs will improve Outcome: Progressing   Problem: Metabolic: Goal: Ability to maintain appropriate glucose levels will improve Outcome: Progressing   Problem: Nutritional: Goal: Maintenance of adequate nutrition will improve Outcome: Progressing Goal: Progress toward achieving an optimal weight will improve Outcome: Progressing   Problem: Skin Integrity: Goal: Risk for impaired skin integrity will decrease Outcome: Progressing   Problem: Education: Goal: Knowledge of General Education information will improve Description: Including pain rating scale, medication(s)/side effects and non-pharmacologic comfort measures Outcome: Progressing   Problem: Health Behavior/Discharge Planning: Goal: Ability to manage health-related needs will improve Outcome: Progressing   Problem: Clinical Measurements: Goal: Ability to maintain clinical measurements within normal limits will improve Outcome: Progressing Goal: Will remain free from infection Outcome: Progressing Goal: Diagnostic test results will improve Outcome: Progressing Goal: Respiratory complications will improve Outcome: Progressing Goal: Cardiovascular complication will be avoided Outcome: Progressing   Problem: Activity: Goal: Risk for activity intolerance will decrease Outcome: Progressing   Problem: Nutrition: Goal:  Adequate nutrition will be maintained Outcome: Progressing   Problem: Elimination: Goal: Will not experience complications related to bowel motility Outcome: Progressing Goal: Will not experience complications related to urinary retention Outcome: Progressing   Problem: Pain Managment: Goal: General experience of comfort will improve and/or be controlled Outcome: Progressing   Problem: Safety: Goal: Ability to remain free from injury will improve Outcome: Progressing   Problem: Health Behavior/Discharge Planning: Goal: Ability to manage health-related needs will improve Outcome: Progressing   Problem: Clinical Measurements: Goal: Complications related to the disease process, condition or treatment will be avoided or minimized Outcome: Progressing

## 2024-07-02 NOTE — Anesthesia Postprocedure Evaluation (Signed)
 Anesthesia Post Note  Patient: Teonia Yager  Procedure(s) Performed: INSERTION OF DIALYSIS CATHETER, tunneled catheter (Right: Neck)  Patient location during evaluation: Phase II Anesthesia Type: General Level of consciousness: awake Pain management: pain level controlled Vital Signs Assessment: post-procedure vital signs reviewed and stable Respiratory status: spontaneous breathing and respiratory function stable Cardiovascular status: blood pressure returned to baseline and stable Postop Assessment: no headache and no apparent nausea or vomiting Anesthetic complications: no Comments: Late entry   No notable events documented.   Last Vitals:  Vitals:   07/02/24 1340 07/02/24 1345  BP: (!) 144/66 139/67  Pulse: 65 62  Resp: 16 14  Temp:    SpO2: 97% 96%    Last Pain:  Vitals:   07/02/24 1335  TempSrc:   PainSc: 0-No pain                 Yvonna JINNY Bosworth

## 2024-07-02 NOTE — Progress Notes (Signed)
 Rounding Note   Patient Name: [name] Date of Encounter: 07/02/2024  [facility] Cardiologist: Vishnu P Mallipeddi, MD   Subjective No chest pain Seems to have gotten a black eye in OR   Scheduled Meds:  sodium chloride    Intravenous Once   atorvastatin   40 mg Oral q morning   carvedilol   6.25 mg Oral BID WC   Chlorhexidine Gluconate Cloth  6 each Topical Q0600   Chlorhexidine Gluconate Cloth  6 each Topical Q0600   clopidogrel   75 mg Oral Daily   darbepoetin (ARANESP ) injection - DIALYSIS  100 mcg Subcutaneous Q Mon-1800   famotidine   20 mg Oral Daily   feeding supplement (NEPRO CARB STEADY)  237 mL Oral BID BM   insulin  aspart  0-9 Units Subcutaneous TID AC & HS   isosorbide  mononitrate  90 mg Oral q morning   lamoTRIgine   100 mg Oral BID   multivitamin  1 tablet Oral QHS   pantoprazole   40 mg Oral BID AC   sertraline   100 mg Oral Daily   Continuous Infusions:  anticoagulant sodium citrate     PRN Meds: acetaminophen , alteplase, anticoagulant sodium citrate, clonazePAM , diphenhydrAMINE -zinc acetate, fentaNYL  (SUBLIMAZE ) injection, glucagon (human recombinant), hydrALAZINE , ipratropium-albuterol , lidocaine  (PF), lidocaine -prilocaine, mouth rinse, oxyCODONE , pentafluoroprop-tetrafluoroeth, senna-docusate   Vital Signs  Vitals:   07/01/24 1456 07/01/24 1500 07/01/24 2111 07/02/24 0020  BP: (!) 156/61 (!) 159/60 (!) 129/52 (!) 151/62  Pulse: 66 67 71 70  Resp: 16 18 15 18   Temp:  98.1 F (36.7 C) 97.6 F (36.4 C) 98 F (36.7 C)  TempSrc:  Oral Oral Oral  SpO2: 99% 99% 97% 98%  Weight:  99.8 kg    Height:        Intake/Output Summary (Last 24 hours) at 07/02/2024 0813 Last data filed at 07/02/2024 0500 Gross per 24 hour  Intake 880 ml  Output 204 ml  Net 676 ml      07/01/2024    3:00 PM 07/01/2024   12:46 PM 06/30/2024    1:41 PM  Last 3 Weights  Weight (lbs) 220 lb 0.3 oz 220 lb 0.3 oz 221 lb 9 oz  Weight (kg) 99.8 kg 99.8 kg 100.5 kg       Telemetry NSR - Personally Reviewed  ECG  N/a - Personally Reviewed  Physical Exam  Over weight white female New dialysis catheter right sublcavian SEM Lungs clear Echymosis under left eye Plus one edema  Labs High Sensitivity Troponin:  No results for input(s): TROPONINIHS in the last 720 hours.   Chemistry Recent Labs  Lab 06/25/24 2329 06/27/24 0426 06/30/24 0512 07/01/24 0515 07/01/24 0516 07/02/24 0446 07/02/24 0447  NA 133*   < > 137 136 136 137 137  K 4.8   < > 4.4 4.0 4.0 3.6 3.7  CL 99   < > 101 100 101 100 100  CO2 22   < > 21* 21* 21* 26 26  GLUCOSE 238*   < > 111* 144* 144* 145* 143*  BUN 31*   < > 60* 58* 57* 35* 35*  CREATININE 3.40*   < > 6.97* 6.66* 6.63* 5.43* 5.13*  CALCIUM  9.5   < > 9.0 8.9 9.0 8.7* 8.8*  MG 1.9  --   --  1.9  --  1.9  --   PROT 9.5*  --   --   --   --   --   --   ALBUMIN  4.1   < >  3.3*  --  3.4*  --  3.3*  AST 10*  --   --   --   --   --   --   ALT 11  --   --   --   --   --   --   ALKPHOS 82  --   --   --   --   --   --   BILITOT 0.5  --   --   --   --   --   --   GFRNONAA 15*   < > 6* 7* 7* 8* 9*  ANIONGAP 12   < > 15 14 15 12 11    < > = values in this interval not displayed.    Lipids No results for input(s): CHOL, TRIG, HDL, LABVLDL, LDLCALC, CHOLHDL in the last 168 hours.  Hematology Recent Labs  Lab 06/30/24 0512 07/01/24 0515 07/02/24 0446  WBC 4.5 4.0 4.1  RBC 2.48* 2.97* 2.93*  HGB 7.5* 8.9* 8.9*  HCT 22.8* 26.7* 26.8*  MCV 91.9 89.9 91.5  MCH 30.2 30.0 30.4  MCHC 32.9 33.3 33.2  RDW 16.7* 15.9* 16.2*  PLT 153 155 165   Thyroid No results for input(s): TSH, FREET4 in the last 168 hours.  BNP Recent Labs  Lab 06/25/24 2329  PROBNP 1,087.0*    DDimer No results for input(s): DDIMER in the last 168 hours.   Radiology  DG Chest Port 1 View Result Date: 07/01/2024 EXAM: 1 VIEW XRAY OF THE CHEST 07/01/2024 11:49:00 AM COMPARISON: 06/27/2024 CLINICAL HISTORY: [id number] Vascular  dialysis catheter in place [phone]. S/p dialysis cath placement. FINDINGS: LINES, TUBES AND DEVICES: Right internal jugular dialysis catheter with distal tip in the SVC (superior vena cava). LUNGS AND PLEURA: No focal pulmonary opacity. No pulmonary edema. No pleural effusion. No pneumothorax. HEART AND MEDIASTINUM: No acute abnormality of the cardiac and mediastinal silhouettes. BONES AND SOFT TISSUES: No acute osseous abnormality. IMPRESSION: 1. Right internal jugular dialysis catheter with distal tip in the superior vena cava. 2. No pneumothorax. Electronically signed by: Lynwood Seip MD 07/01/2024 12:20 PM EDT RP Workstation: [id number]   DG C-Arm 1-60 Min-No Report Result Date: 07/01/2024 Fluoroscopy was utilized by the requesting physician.  No radiographic interpretation.      Patient Profile   [name] is a 63 y.o. female with a hx of AD with prior PCI to RCA in 2022 in [place], [place] . Chronic HFpreF, HTN, HLD, DM2, prior DVT on eliquis , PAF, anemia of chronic disease, CKD IV who is being seen 06/28/2024 for the evaluation of chest pain at the request of Dr Vicci.   Assessment & Plan  1.Chest pain/CAD - PCI to RCA in 2022 in [place], [place]  - lLow risk myovue 04/2024 - Not a candidate for cath with anemia and a/cRF - continue coreg , imdur , statin and plavix     2.PAF - hold eliquis  with drop in Hgb - Hct 26.8 this am - Continue beta blocker in NSR      3. AKI on CKD - per nephrology, Cr 5.13 - Had dialysis yesterday and will have it today         For questions or updates, please contact  [facility] Please consult [email or web address] for contact info under       Signed, Maude Emmer, MD  07/02/2024, 8:13 AM

## 2024-07-02 NOTE — Progress Notes (Addendum)
 Core lab uploaded to [facility], awaiting feedback from admissions, will update when possible.   [name] Dialysis Navigator [phone]  Addendum 1147 Admissions state they no longer have a MWF chair, may have given spot way while hep b core was waiting to be submitted. All they have is a TTS 1st shift. Will see if pt will accept this or not.   Addendum 1205 Received confirmation that pt has been cleared to [facility], TTS, 0645am chair time, will need to arrive first appt 0630. Can start next Tuesday, 07/06/24. Pt agreeable to this schedule, appreciate nursing staff help with contacting pt. nephrology, SW, RN, and hospitalist informed. Info added to AVS, will continue to assist as needed.

## 2024-07-02 NOTE — Plan of Care (Signed)
  Problem: Education: Goal: Ability to describe self-care measures that may prevent or decrease complications (Diabetes Survival Skills Education) will improve Outcome: Progressing Goal: Individualized Educational Video(s) Outcome: Progressing   Problem: Coping: Goal: Ability to adjust to condition or change in health will improve Outcome: Progressing   Problem: Fluid Volume: Goal: Ability to maintain a balanced intake and output will improve Outcome: Progressing   Problem: Health Behavior/Discharge Planning: Goal: Ability to identify and utilize available resources and services will improve Outcome: Progressing Goal: Ability to manage health-related needs will improve Outcome: Progressing   Problem: Metabolic: Goal: Ability to maintain appropriate glucose levels will improve Outcome: Progressing   Problem: Nutritional: Goal: Maintenance of adequate nutrition will improve Outcome: Progressing Goal: Progress toward achieving an optimal weight will improve Outcome: Progressing   Problem: Skin Integrity: Goal: Risk for impaired skin integrity will decrease Outcome: Progressing   Problem: Tissue Perfusion: Goal: Adequacy of tissue perfusion will improve Outcome: Progressing   Problem: Education: Goal: Knowledge of General Education information will improve Description: Including pain rating scale, medication(s)/side effects and non-pharmacologic comfort measures Outcome: Progressing   Problem: Health Behavior/Discharge Planning: Goal: Ability to manage health-related needs will improve Outcome: Progressing   Problem: Clinical Measurements: Goal: Ability to maintain clinical measurements within normal limits will improve Outcome: Progressing Goal: Will remain free from infection Outcome: Progressing Goal: Diagnostic test results will improve Outcome: Progressing Goal: Respiratory complications will improve Outcome: Progressing Goal: Cardiovascular complication will  be avoided Outcome: Progressing   Problem: Activity: Goal: Risk for activity intolerance will decrease Outcome: Progressing   Problem: Nutrition: Goal: Adequate nutrition will be maintained Outcome: Progressing   Problem: Coping: Goal: Level of anxiety will decrease Outcome: Progressing   Problem: Elimination: Goal: Will not experience complications related to bowel motility Outcome: Progressing Goal: Will not experience complications related to urinary retention Outcome: Progressing   Problem: Pain Managment: Goal: General experience of comfort will improve and/or be controlled Outcome: Progressing   Problem: Safety: Goal: Ability to remain free from injury will improve Outcome: Progressing   Problem: Skin Integrity: Goal: Risk for impaired skin integrity will decrease Outcome: Progressing   Problem: Education: Goal: Knowledge of disease and its progression will improve Outcome: Progressing Goal: Individualized Educational Video(s) Outcome: Progressing   Problem: Fluid Volume: Goal: Compliance with measures to maintain balanced fluid volume will improve Outcome: Progressing   Problem: Health Behavior/Discharge Planning: Goal: Ability to manage health-related needs will improve Outcome: Progressing   Problem: Nutritional: Goal: Ability to make healthy dietary choices will improve Outcome: Progressing   Problem: Clinical Measurements: Goal: Complications related to the disease process, condition or treatment will be avoided or minimized Outcome: Progressing

## 2024-07-02 NOTE — Procedures (Signed)
 Received patient in bed to unit.  Alert and oriented.  Informed consent signed and in chart.   Cleansed and accessed patients R chest catheter with no issues. Pt completed tx, 1.6 ml of heparin  was administered in each lumen.  TX duration:2.5HRS  Patient tolerated well.  Transported back to the room  Alert, without acute distress.  Hand-off given to patient's nurse.   Access used: R chest Catheter  Access issues: None  Total UF removed: 0.5L Medication(s) given: None Post HD weight: 98.4Kg Post HD VS: 138/73BP, 77P, 15R, 99O2-RA   Zora Glendenning [facility]

## 2024-07-02 NOTE — Progress Notes (Signed)
 07/02/2024 10:59 AM -----------------------------------------------------------[facility]--------------------------------------------------- D(Data) A(Action) R(response)     Data: Discharge Readiness Assessment EDD currently listed for tomorrow 07/03/2024     Action: Chart reviewed    Response: No immediate Barriers to discharge identified at this time pending clinical improvement and pt. progression. ICM/TOC has arranged outpatient HD char for pt. MWF and is following closely for any changes in discharge needs.     Vashaun Osmon, RN [employer] [facility]

## 2024-07-02 NOTE — Anesthesia Postprocedure Evaluation (Signed)
 Anesthesia Post Note  Patient: [name]  Procedure(s) Performed: EGD (ESOPHAGOGASTRODUODENOSCOPY)  Patient location during evaluation: Phase II Anesthesia Type: General Level of consciousness: awake Pain management: pain level controlled Vital Signs Assessment: post-procedure vital signs reviewed and stable Respiratory status: spontaneous breathing and respiratory function stable Cardiovascular status: blood pressure returned to baseline and stable Postop Assessment: no headache and no apparent nausea or vomiting Anesthetic complications: no Comments: Late entry   No notable events documented.   Last Vitals:  Vitals:   07/02/24 0020 07/02/24 0831  BP: (!) 151/62 116/71  Pulse: 70 69  Resp: 18   Temp: 36.7 C   SpO2: 98%     Last Pain:  Vitals:   07/02/24 0605  TempSrc:   PainSc: Asleep                 Yvonna JINNY Bosworth

## 2024-07-03 DIAGNOSIS — I2510 Atherosclerotic heart disease of native coronary artery without angina pectoris: Secondary | ICD-10-CM | POA: Diagnosis not present

## 2024-07-03 DIAGNOSIS — E782 Mixed hyperlipidemia: Secondary | ICD-10-CM | POA: Diagnosis not present

## 2024-07-03 DIAGNOSIS — I10 Essential (primary) hypertension: Secondary | ICD-10-CM | POA: Diagnosis not present

## 2024-07-03 DIAGNOSIS — R079 Chest pain, unspecified: Secondary | ICD-10-CM | POA: Diagnosis not present

## 2024-07-03 LAB — GLUCOSE, CAPILLARY
Glucose-Capillary: 149 mg/dL — ABNORMAL HIGH (ref 70–99)
Glucose-Capillary: 197 mg/dL — ABNORMAL HIGH (ref 70–99)
Glucose-Capillary: 238 mg/dL — ABNORMAL HIGH (ref 70–99)
Glucose-Capillary: 222 mg/dL — ABNORMAL HIGH (ref 70–99)

## 2024-07-03 LAB — RENAL FUNCTION PANEL
Albumin: 3.5 g/dL (ref 3.5–5.0)
Anion gap: 12 (ref 5–15)
BUN: 22 mg/dL (ref 8–23)
CO2: 26 mmol/L (ref 22–32)
Calcium: 9 mg/dL (ref 8.9–10.3)
Chloride: 98 mmol/L (ref 98–111)
Creatinine, Ser: 4.34 mg/dL — ABNORMAL HIGH (ref 0.44–1.00)
GFR, Estimated: 11 mL/min — ABNORMAL LOW (ref >60)
Glucose, Bld: 159 mg/dL — ABNORMAL HIGH (ref 70–99)
Phosphorus: 4.3 mg/dL (ref 2.5–4.6)
Potassium: 3.4 mmol/L — ABNORMAL LOW (ref 3.5–5.1)
Sodium: 136 mmol/L (ref 135–145)

## 2024-07-03 LAB — CBC
HCT: 27.3 % — ABNORMAL LOW (ref 36.0–46.0)
Hemoglobin: 9 g/dL — ABNORMAL LOW (ref 12.0–15.0)
MCH: 29.9 pg (ref 26.0–34.0)
MCHC: 33 g/dL (ref 30.0–36.0)
MCV: 90.7 fL (ref 80.0–100.0)
Platelets: 165 K/uL (ref 150–400)
RBC: 3.01 MIL/uL — ABNORMAL LOW (ref 3.87–5.11)
RDW: 16 % — ABNORMAL HIGH (ref 11.5–15.5)
WBC: 4.8 K/uL (ref 4.0–10.5)
nRBC: 0 % (ref 0.0–0.2)

## 2024-07-03 LAB — MAGNESIUM: Magnesium: 1.8 mg/dL (ref 1.7–2.4)

## 2024-07-03 NOTE — Plan of Care (Signed)
  Problem: Education: Goal: Ability to describe self-care measures that may prevent or decrease complications (Diabetes Survival Skills Education) will improve Outcome: Progressing Goal: Individualized Educational Video(s) Outcome: Progressing   Problem: Coping: Goal: Ability to adjust to condition or change in health will improve Outcome: Progressing   Problem: Fluid Volume: Goal: Ability to maintain a balanced intake and output will improve Outcome: Progressing   Problem: Health Behavior/Discharge Planning: Goal: Ability to identify and utilize available resources and services will improve Outcome: Progressing Goal: Ability to manage health-related needs will improve Outcome: Progressing   Problem: Metabolic: Goal: Ability to maintain appropriate glucose levels will improve Outcome: Progressing   Problem: Nutritional: Goal: Maintenance of adequate nutrition will improve Outcome: Progressing Goal: Progress toward achieving an optimal weight will improve Outcome: Progressing   Problem: Skin Integrity: Goal: Risk for impaired skin integrity will decrease Outcome: Progressing   Problem: Tissue Perfusion: Goal: Adequacy of tissue perfusion will improve Outcome: Progressing   Problem: Education: Goal: Knowledge of General Education information will improve Description: Including pain rating scale, medication(s)/side effects and non-pharmacologic comfort measures Outcome: Progressing   Problem: Health Behavior/Discharge Planning: Goal: Ability to manage health-related needs will improve Outcome: Progressing   Problem: Clinical Measurements: Goal: Ability to maintain clinical measurements within normal limits will improve Outcome: Progressing Goal: Will remain free from infection Outcome: Progressing Goal: Diagnostic test results will improve Outcome: Progressing Goal: Respiratory complications will improve Outcome: Progressing Goal: Cardiovascular complication will  be avoided Outcome: Progressing   Problem: Activity: Goal: Risk for activity intolerance will decrease Outcome: Progressing   Problem: Nutrition: Goal: Adequate nutrition will be maintained Outcome: Progressing   Problem: Coping: Goal: Level of anxiety will decrease Outcome: Progressing   Problem: Elimination: Goal: Will not experience complications related to bowel motility Outcome: Progressing Goal: Will not experience complications related to urinary retention Outcome: Progressing   Problem: Pain Managment: Goal: General experience of comfort will improve and/or be controlled Outcome: Progressing   Problem: Safety: Goal: Ability to remain free from injury will improve Outcome: Progressing   Problem: Skin Integrity: Goal: Risk for impaired skin integrity will decrease Outcome: Progressing   Problem: Education: Goal: Knowledge of disease and its progression will improve Outcome: Progressing Goal: Individualized Educational Video(s) Outcome: Progressing   Problem: Fluid Volume: Goal: Compliance with measures to maintain balanced fluid volume will improve Outcome: Progressing   Problem: Health Behavior/Discharge Planning: Goal: Ability to manage health-related needs will improve Outcome: Progressing   Problem: Nutritional: Goal: Ability to make healthy dietary choices will improve Outcome: Progressing   Problem: Clinical Measurements: Goal: Complications related to the disease process, condition or treatment will be avoided or minimized Outcome: Progressing

## 2024-07-03 NOTE — Plan of Care (Signed)
  Problem: Education: Goal: Ability to describe self-care measures that may prevent or decrease complications (Diabetes Survival Skills Education) will improve Outcome: Progressing   Problem: Coping: Goal: Ability to adjust to condition or change in health will improve Outcome: Progressing   Problem: Coping: Goal: Ability to adjust to condition or change in health will improve Outcome: Progressing   Problem: Fluid Volume: Goal: Ability to maintain a balanced intake and output will improve Outcome: Progressing   Problem: Health Behavior/Discharge Planning: Goal: Ability to manage health-related needs will improve Outcome: Progressing

## 2024-07-03 NOTE — Plan of Care (Signed)
  Problem: Education: Goal: Ability to describe self-care measures that may prevent or decrease complications (Diabetes Survival Skills Education) will improve Outcome: Adequate for Discharge Goal: Individualized Educational Video(s) Outcome: Adequate for Discharge   Problem: Coping: Goal: Ability to adjust to condition or change in health will improve Outcome: Adequate for Discharge   Problem: Fluid Volume: Goal: Ability to maintain a balanced intake and output will improve Outcome: Adequate for Discharge   Problem: Health Behavior/Discharge Planning: Goal: Ability to identify and utilize available resources and services will improve Outcome: Progressing Goal: Ability to manage health-related needs will improve Outcome: Progressing   Problem: Metabolic: Goal: Ability to maintain appropriate glucose levels will improve Outcome: Progressing   Problem: Nutritional: Goal: Maintenance of adequate nutrition will improve Outcome: Adequate for Discharge Goal: Progress toward achieving an optimal weight will improve Outcome: Progressing   Problem: Skin Integrity: Goal: Risk for impaired skin integrity will decrease Outcome: Progressing   Problem: Tissue Perfusion: Goal: Adequacy of tissue perfusion will improve Outcome: Adequate for Discharge   Problem: Education: Goal: Knowledge of General Education information will improve Description: Including pain rating scale, medication(s)/side effects and non-pharmacologic comfort measures Outcome: Adequate for Discharge   Problem: Health Behavior/Discharge Planning: Goal: Ability to manage health-related needs will improve Outcome: Adequate for Discharge   Problem: Clinical Measurements: Goal: Ability to maintain clinical measurements within normal limits will improve Outcome: Progressing Goal: Will remain free from infection Outcome: Progressing Goal: Diagnostic test results will improve Outcome: Progressing Goal: Respiratory  complications will improve Outcome: Adequate for Discharge Goal: Cardiovascular complication will be avoided Outcome: Adequate for Discharge   Problem: Activity: Goal: Risk for activity intolerance will decrease Outcome: Adequate for Discharge   Problem: Nutrition: Goal: Adequate nutrition will be maintained Outcome: Adequate for Discharge   Problem: Coping: Goal: Level of anxiety will decrease Outcome: Progressing   Problem: Elimination: Goal: Will not experience complications related to bowel motility Outcome: Progressing Goal: Will not experience complications related to urinary retention Outcome: Progressing   Problem: Pain Managment: Goal: General experience of comfort will improve and/or be controlled Outcome: Progressing   Problem: Safety: Goal: Ability to remain free from injury will improve Outcome: Adequate for Discharge   Problem: Skin Integrity: Goal: Risk for impaired skin integrity will decrease Outcome: Progressing   Problem: Education: Goal: Knowledge of disease and its progression will improve Outcome: Progressing Goal: Individualized Educational Video(s) Outcome: Progressing   Problem: Fluid Volume: Goal: Compliance with measures to maintain balanced fluid volume will improve Outcome: Adequate for Discharge   Problem: Health Behavior/Discharge Planning: Goal: Ability to manage health-related needs will improve Outcome: Adequate for Discharge   Problem: Nutritional: Goal: Ability to make healthy dietary choices will improve Outcome: Progressing   Problem: Clinical Measurements: Goal: Complications related to the disease process, condition or treatment will be avoided or minimized Outcome: Progressing

## 2024-07-03 NOTE — Progress Notes (Signed)
 PROGRESS NOTE    [name]  FMW:[id number] DOB: 1960-11-03 DOA: 06/25/2024 PCP: System, Provider Not In   Brief Hospital admission narrative: As per H&P written by Dr. Manfred on 06/26/2024 [name] is a 63 y.o. female with medical history significant of generalized anxiety disorder with panic attacks, asthma. Chronic chest pain  CAD, STEMI with PCI x . GI bleed, DVT, Bipolar disorder, DM 2, neuropathy, hyperlipidemia, hypertension, GERD, CKD V, gastroparesis, anemia of chronic disease and a fib. Patient reports over last 5-6 months has had worsening renal function has experienced dec,line in mobility tolerance, strength and dizziness and dyspnea when she gets up.  (Utilizes rolling seat walker with assistance from family members for home mobility)now progressed to stage 5. She is followed  with Dr Fernand nephrology in [place] .  This past Wednesday she experienced chest pain  accompanied by diaphoresis and nausea. Chest pain described left side of her chest and her left arm. This event occurred again today and her nephrologist referred her to come here to [facility] ER as patient has appointment on 18th. Workup in ED troponin flat 33-30, chest pain resolved with fentanyl  (unable to tolerate nitroglycerin ),  BNP 1087 with normal respirations and sats on room air. Last ECHO reviewed in EPIC 05/06/2024 EF 60-65%, LVH grade I diastolic dysfunction  Assessment and plan:  Acute kidney injury on chronic kidney disease stage V.  Patient is now ESRD. Patient's creatinine has fluctuated from 3.0-7.  -Status post right IJ hemodialysis catheter placement on 07/01/2024 and first dialysis treatment performed after that. - Patient has received another dialysis on 07/02/2024 without any complications. - Renal ultrasound: Negative - Patient has been accepted for hemodialysis at [facility] dialysis unit on a schedule TTS 6:45 AM chair time.  Plan is for hemodialysis to be started later on Tuesday,  07/06/2024. -After discussing with nephrology service patient will be observed overnight with anticipated discharge home on 07/04/2024 if no further complaints of abnormalities appreciated. - Continue to maintain adequate hydration.  Chest pain/CAD/STEMI with PCI hx -Prior history of PCI in [place] .  Has had ongoing history of off-and-on chest pain.  - Troponin has remained flat and no suggesting acute ischemic changes - EKG also without abnormalities. - Cardiology service recommending no cardiac catheter ischemic evaluation throughout this hospitalization. - Echo demonstrating preserved ejection fraction and no wall motion abnormalities (reassuring).  - Continue treatment with Plavix , statin, Coreg , and Imdur .     Elevated pro BNP  -No obvious evidence of exacerbation noted.   - No shortness of breath or orthopnea - Will continue closely monitoring patient's volume management and adjusting it with hemodialysis. -Low-sodium diet discussed with patient.   Acute anemia GI bleed - Hemoglobin down to 6.8 underwent EGD showing normal esophagus but had large amount of solid food therefore repeated on 10/15 showing normal esophagus, possible consistent change with portal hypertensive gastropathy status post biopsy.  Recommend outpatient follow-up and possible ileocolonoscopy and possible VCE -PPI twice daily - PRBC transfusion : 10/13, 10/15.  -Hemoglobin now stable at 8.9. -IV iron and Epogen therapy as per nephrology discretion.   A fib Paroxysmal -Patient is in normal sinus rhythm.   - Continue management with carvedilol . - Will continue to hold Eliquis  for now.     Type 2 diabetes mellitus with hyperglycemia A1c 6.5.  - Continue to follow CBG fluctuation and adjust hypoglycemic regimen as required. - Continue sliding scale insulin .    Hyperkalemia -Treated with Lokelma -Continue to follow ultralights  and further adjust management with hemodialysis.   GAD/Bipolar -Continue  lamictal , sertraline , klonopin  -Has planned psych appointment in 08/2024 - No suicidal ideation or hallucination.     Diet Orders (From admission, onward)     Start     Ordered   07/01/24 1525  Diet renal/carb modified with fluid restriction Fluid restriction: 1200 mL Fluid; Room service appropriate? Yes; Fluid consistency: Thin  Diet effective now       Question Answer Comment  Fluid restriction: 1200 mL Fluid   Room service appropriate? Yes   Fluid consistency: Thin      07/01/24 1524           Subjective: Patient is afebrile, in no acute distress.  Reports no nausea, no vomiting and no shortness of breath.  No overnight events reported.  Hemodynamically stable.  Objective: Vitals:   07/02/24 1615 07/02/24 2046 07/03/24 0402 07/03/24 1322  BP: 138/73 (!) 138/53 119/71 (!) 121/53  Pulse: 77 80 71 85  Resp: 15 18 16 17   Temp: 98.5 F (36.9 C) 98.1 F (36.7 C) 98.4 F (36.9 C) 97.9 F (36.6 C)  TempSrc:  Oral Oral Oral  SpO2: 99% 97% 97% 99%  Weight: 98.4 kg     Height:       General exam: Alert, awake, oriented x 3; still reporting some discomfort where right IJ dialysis catheter is located.  Denies shortness of breath and is currently afebrile. Respiratory system: Good saturation on room air. Cardiovascular system:RRR.  Rubs or gallops; no JVD. Gastrointestinal system: Abdomen is obese, nondistended, soft and nontender. No organomegaly or masses felt. Normal bowel sounds heard. Central nervous system: Alert and oriented. No focal neurological deficits. Extremities: No cyanosis or clubbing.  No edema on exam. Skin: No petechiae.  Right IJ tunneled hemodialysis catheter in place.  No drainage. Psychiatry: Judgement and insight appear normal. Mood & affect appropriate.    Intake/Output Summary (Last 24 hours) at 07/03/2024 1623 Last data filed at 07/03/2024 0900 Gross per 24 hour  Intake 720 ml  Output 200 ml  Net 520 ml   Filed Weights   07/01/24 1500  07/02/24 1335 07/02/24 1615  Weight: 99.8 kg 99.2 kg 98.4 kg    Scheduled Meds:  sodium chloride    Intravenous Once   atorvastatin   40 mg Oral q morning   carvedilol   6.25 mg Oral BID WC   Chlorhexidine Gluconate Cloth  6 each Topical Q0600   Chlorhexidine Gluconate Cloth  6 each Topical Q0600   clopidogrel   75 mg Oral Daily   darbepoetin (ARANESP ) injection - DIALYSIS  100 mcg Subcutaneous Q Mon-1800   famotidine   20 mg Oral Daily   feeding supplement (NEPRO CARB STEADY)  237 mL Oral BID BM   insulin  aspart  0-9 Units Subcutaneous TID AC & HS   isosorbide  mononitrate  90 mg Oral q morning   lamoTRIgine   100 mg Oral BID   multivitamin  1 tablet Oral QHS   pantoprazole   40 mg Oral BID AC   sertraline   100 mg Oral Daily   Continuous Infusions:  anticoagulant sodium citrate      Nutritional status Signs/Symptoms: estimated needs Interventions: MVI, Nepro shake Body mass index is 37.24 kg/m.  Data Reviewed:   CBC: Recent Labs  Lab 06/29/24 0502 06/30/24 0512 07/01/24 0515 07/02/24 0446 07/03/24 0459  WBC 5.5 4.5 4.0 4.1 4.8  HGB 7.7* 7.5* 8.9* 8.9* 9.0*  HCT 23.2* 22.8* 26.7* 26.8* 27.3*  MCV 91.3 91.9 89.9 91.5  90.7  PLT 135* 153 155 165 165   Basic Metabolic Panel: Recent Labs  Lab 06/30/24 0512 07/01/24 0515 07/01/24 0516 07/02/24 0446 07/02/24 0447 07/03/24 0459  NA 137 136 136 137 137 136  K 4.4 4.0 4.0 3.6 3.7 3.4*  CL 101 100 101 100 100 98  CO2 21* 21* 21* 26 26 26   GLUCOSE 111* 144* 144* 145* 143* 159*  BUN 60* 58* 57* 35* 35* 22  CREATININE 6.97* 6.66* 6.63* 5.43* 5.13* 4.34*  CALCIUM  9.0 8.9 9.0 8.7* 8.8* 9.0  MG  --  1.9  --  1.9  --  1.8  PHOS 6.7* 6.6* 6.6*  --  4.8* 4.3   GFR: Estimated Creatinine Clearance: 15.3 mL/min (A) (by C-G formula based on SCr of 4.34 mg/dL (H)).  Liver Function Tests: Recent Labs  Lab 06/29/24 0502 06/30/24 0512 07/01/24 0516 07/02/24 0447 07/03/24 0459  ALBUMIN  3.3* 3.3* 3.4* 3.3* 3.5   BNP (last 3  results) Recent Labs    06/25/24 2329  PROBNP 1,087.0*   HbA1C: No results for input(s): HGBA1C in the last 72 hours.  CBG: Recent Labs  Lab 07/02/24 1122 07/02/24 2106 07/03/24 0716 07/03/24 1110 07/03/24 1607  GLUCAP 191* 221* 149* 197* 238*   Radiology Studies: No results found.    LOS: 7 days   Time spent= 35 mins    Eric Nunnery, MD [facility]  If 7PM-7AM, please contact night-coverage  07/03/2024, 4:23 PM

## 2024-07-04 DIAGNOSIS — N179 Acute kidney failure, unspecified: Secondary | ICD-10-CM | POA: Diagnosis not present

## 2024-07-04 DIAGNOSIS — N186 End stage renal disease: Secondary | ICD-10-CM

## 2024-07-04 DIAGNOSIS — E118 Type 2 diabetes mellitus with unspecified complications: Secondary | ICD-10-CM

## 2024-07-04 DIAGNOSIS — E66813 Obesity, class 3: Secondary | ICD-10-CM

## 2024-07-04 DIAGNOSIS — Z992 Dependence on renal dialysis: Secondary | ICD-10-CM

## 2024-07-04 DIAGNOSIS — K219 Gastro-esophageal reflux disease without esophagitis: Secondary | ICD-10-CM

## 2024-07-04 DIAGNOSIS — R079 Chest pain, unspecified: Secondary | ICD-10-CM | POA: Diagnosis not present

## 2024-07-04 DIAGNOSIS — D649 Anemia, unspecified: Secondary | ICD-10-CM | POA: Diagnosis not present

## 2024-07-04 DIAGNOSIS — I2510 Atherosclerotic heart disease of native coronary artery without angina pectoris: Secondary | ICD-10-CM | POA: Diagnosis not present

## 2024-07-04 LAB — CBC
HCT: 27.3 % — ABNORMAL LOW (ref 36.0–46.0)
Hemoglobin: 8.9 g/dL — ABNORMAL LOW (ref 12.0–15.0)
MCH: 29.5 pg (ref 26.0–34.0)
MCHC: 32.6 g/dL (ref 30.0–36.0)
MCV: 90.4 fL (ref 80.0–100.0)
Platelets: 172 K/uL (ref 150–400)
RBC: 3.02 MIL/uL — ABNORMAL LOW (ref 3.87–5.11)
RDW: 15.9 % — ABNORMAL HIGH (ref 11.5–15.5)
WBC: 4.8 K/uL (ref 4.0–10.5)
nRBC: 0 % (ref 0.0–0.2)

## 2024-07-04 LAB — RENAL FUNCTION PANEL
Albumin: 3.6 g/dL (ref 3.5–5.0)
Anion gap: 14 (ref 5–15)
BUN: 30 mg/dL — ABNORMAL HIGH (ref 8–23)
CO2: 25 mmol/L (ref 22–32)
Calcium: 8.9 mg/dL (ref 8.9–10.3)
Chloride: 98 mmol/L (ref 98–111)
Creatinine, Ser: 5.73 mg/dL — ABNORMAL HIGH (ref 0.44–1.00)
GFR, Estimated: 8 mL/min — ABNORMAL LOW (ref >60)
Glucose, Bld: 175 mg/dL — ABNORMAL HIGH (ref 70–99)
Phosphorus: 5.4 mg/dL — ABNORMAL HIGH (ref 2.5–4.6)
Potassium: 3.6 mmol/L (ref 3.5–5.1)
Sodium: 137 mmol/L (ref 135–145)

## 2024-07-04 LAB — MAGNESIUM: Magnesium: 1.8 mg/dL (ref 1.7–2.4)

## 2024-07-04 LAB — GLUCOSE, CAPILLARY
Glucose-Capillary: 173 mg/dL — ABNORMAL HIGH (ref 70–99)
Glucose-Capillary: 212 mg/dL — ABNORMAL HIGH (ref 70–99)

## 2024-07-04 MED ORDER — OXYCODONE HCL 5 MG PO TABS: 5.0000 mg | ORAL_TABLET | Freq: Four times a day (QID) | ORAL | 0 refills | Status: DC | PRN

## 2024-07-04 MED ORDER — ISOSORBIDE MONONITRATE ER 30 MG PO TB24: 90.0000 mg | ORAL_TABLET | Freq: Every morning | ORAL | 3 refills | Status: AC

## 2024-07-04 MED ORDER — RENA-VITE PO TABS: 1.0000 | ORAL_TABLET | Freq: Every day | ORAL | 2 refills | Status: AC

## 2024-07-04 MED ORDER — APIXABAN 5 MG PO TABS: 5.0000 mg | ORAL_TABLET | Freq: Two times a day (BID) | ORAL | Status: AC

## 2024-07-04 MED ORDER — CARVEDILOL 12.5 MG PO TABS: 12.5000 mg | ORAL_TABLET | Freq: Two times a day (BID) | ORAL | 2 refills | Status: DC

## 2024-07-04 NOTE — Discharge Summary (Signed)
 Physician Discharge Summary   Patient: [name] MRN: [id number] DOB: 01/24/1961  Admit date:     06/25/2024  Discharge date: 07/04/24  Discharge Physician: Eric Nunnery   PCP: System, Provider Not In   Recommendations at discharge:  Repeat CBC to follow hemoglobin trend/stability Reassess blood pressure and adjust antihypertensive med as needed Make sure patient follow-up with gastroenterology service and cardiology as instructed Continue patient follow-up with nephrology service for further adjustment into dry weight and dialysis management. Continue assisting patient with weight loss management.  Discharge Diagnoses: Principal Problem:   Chest pain Active Problems:   Acute kidney injury superimposed on chronic kidney disease   Mixed hyperlipidemia   Essential hypertension   Coronary artery disease   GERD   Type 2 diabetes mellitus with hyperglycemia (HCC)   Persistent atrial fibrillation (HCC)   Obesity, Class III, BMI 40-49.9 (morbid obesity) (HCC)   Chest pain with high risk for cardiac etiology   Type 2 diabetes mellitus with complication, with long-term current use of insulin  (HCC)   Anemia   Acute on chronic anemia   Chronic anticoagulation   Enteritis   ESRD on dialysis [facility])  Brief Hospital admission narrative: As per H&P written by Dr. Manfred on 06/26/2024 Analeese Mlissa Marks is a 63 y.o. female with medical history significant of generalized anxiety disorder with panic attacks, asthma. Chronic chest pain  CAD, STEMI with PCI x . GI bleed, DVT, Bipolar disorder, DM 2, neuropathy, hyperlipidemia, hypertension, GERD, CKD V, gastroparesis, anemia of chronic disease and a fib. Patient reports over last 5-6 months has had worsening renal function has experienced dec,line in mobility tolerance, strength and dizziness and dyspnea when she gets up.  (Utilizes rolling seat walker with assistance from family members for home mobility)now progressed to stage 5. She is  followed  with Dr Fernand nephrology in [place] .  This past Wednesday she experienced chest pain  accompanied by diaphoresis and nausea. Chest pain described left side of her chest and her left arm. This event occurred again today and her nephrologist referred her to come here to [facility] ER as patient has appointment on 18th. Workup in ED troponin flat 33-30, chest pain resolved with fentanyl  (unable to tolerate nitroglycerin ),  BNP 1087 with normal respirations and sats on room air. Last ECHO reviewed in EPIC 05/06/2024 EF 60-65%, LVH grade I diastolic dysfunction  Assessment and Plan: Acute kidney injury on chronic kidney disease stage V.  Patient is now ESRD. Patient's creatinine has fluctuated from 3.0-7.  -Status post right IJ hemodialysis catheter placement on 07/01/2024 and first dialysis treatment performed after that. - Patient has received another dialysis on 07/02/2024 without any complications. - Renal ultrasound: Negative - Patient has been accepted for hemodialysis at [facility] dialysis unit on a schedule TTS 6:45 AM chair time.  Plan is for hemodialysis to be started later on Tuesday, 07/06/2024. -After discussing with nephrology service patient will be observed overnight with anticipated discharge home on 07/04/2024 if no further complaints of abnormalities appreciated. - Continue to maintain adequate hydration.  Chest pain/CAD/STEMI with PCI hx -Prior history of PCI in [place] .  Has had ongoing history of off-and-on chest pain.  - Troponin has remained flat and no suggesting acute ischemic changes - EKG also without abnormalities. - Cardiology service recommending no cardiac catheter ischemic evaluation throughout this hospitalization. - Echo demonstrating preserved ejection fraction and no wall motion abnormalities (reassuring).  - Continue treatment with Plavix , statin, Coreg , and Imdur .  - Outpatient  follow-up with cardiology will be arranged.   Elevated pro BNP  -No  obvious evidence of exacerbation noted.   - No shortness of breath or orthopnea - Will continue closely monitoring patient's volume management and adjusting it with hemodialysis. -Low-sodium diet, daily weights and adequate hydration discussed with patient.   Acute anemia GI bleed - Hemoglobin down to 6.8 underwent EGD showing normal esophagus but had large amount of solid food therefore repeated on 10/15 showing normal esophagus, possible consistent change with portal hypertensive gastropathy status post biopsy.  Recommend outpatient follow-up and possible ileocolonoscopy and possible VCE -PPI twice daily - PRBC transfusion : 10/13, 10/15.  -Hemoglobin now stable at 8.9. -IV iron and Epogen therapy as per nephrology discretion. -Gastroenterology service planning for colonoscopy and capsule endoscopy as an outpatient.   A fib Paroxysmal -Patient is in normal sinus rhythm.   - Continue management with carvedilol . - Will continue to hold Eliquis  for now.     Type 2 diabetes mellitus with hyperglycemia A1c 6.5.  - Continue to follow CBG fluctuation and adjust hypoglycemic regimen as required. - Resume home hypoglycemic regimen.   Hyperkalemia -Treated with [facility] - Nephrology service will continue to follow electrolytes and further adjust management with hemodialysis.   GAD/Bipolar -Continue lamictal , sertraline , klonopin  -Has planned psych appointment in 08/2024 - No suicidal ideation or hallucination.   Class 2 obesity - Low-calorie diet and portion control discussed with patient - Continue patient follow-up with PCP -Body mass index is 37.24 kg/m.   Consultants: Nephrology service, gastroenterology service, cardiology service; general surgery was also consulted for dialysis catheter placement. Procedures performed: See below for x-ray reports. Disposition: Home Diet recommendation: Modified carbohydrate, low-sodium and low calorie diet.  DISCHARGE MEDICATION: Allergies as  of 07/04/2024       Reactions   Gelatin Swelling   No jello of any kind   Iodinated Contrast Media Anaphylaxis, Hives, Itching, Swelling   Methylprednisolone  Sodium Succ Shortness Of Breath   Dicyclomine Hcl Anxiety, Other (See Comments)   shaky and sick GI upset, sick to stomach   Doxycycline Hives, Rash   rash   Glutamic Acid Itching, Swelling   Ketorolac Hives, Rash   Tolerated Toradol in the ED without side effect, hives, or complaint on 02/17/2022   Lisinopril  Swelling   Angioedema (facial, lip, or tongue swelling)   Metformin Nausea And Vomiting, Other (See Comments)   Dyspepsia sick to stomach It made me sick where I couldn't get out of the bed   Nitroglycerin  Nausea And Vomiting, Swelling   Pt states it makes her too sick   Nsaids Other (See Comments)   Stomach pain/bleeding   Other Itching, Swelling, Other (See Comments)   Solu-Medrol  Mix-O-Vial   Quetiapine Other (See Comments)   Vancomycin    Itching and erythema at infusion site within a few minutes of starting infusion. No systemic evidence of Red Man Syndrome   Blueberry Flavoring Agent (non-screening) Nausea And Vomiting, Rash   Cephalexin  Nausea And Vomiting, Nausea Only   sick to stomach   Fish Allergy Rash   Ibuprofen Nausea And Vomiting   GI upset Reports stomach upset 2/2 hx gastric polyps. Not true allergy.   Nitrofuran Derivatives Itching, Nausea And Vomiting   sick to stomach   Shellfish Allergy Itching, Swelling, Rash   Strawberry Flavoring Agent (non-screening) Nausea And Vomiting, Rash   Sulfa Antibiotics Nausea And Vomiting, Other (See Comments)   It just makes me real sick  Medication List     STOP taking these medications    gabapentin  300 MG capsule Commonly known as: NEURONTIN        TAKE these medications    albuterol  108 (90 Base) MCG/ACT inhaler Commonly known as: VENTOLIN  HFA Inhale 2 puffs into the lungs every 6 (six) hours as needed for wheezing or shortness  of breath.   apixaban  5 MG Tabs tablet Commonly known as: Eliquis  Take 1 tablet (5 mg total) by mouth 2 (two) times daily. Continue hold medication until follow up/clerance by gastroenterology service. What changed: additional instructions   atorvastatin  40 MG tablet Commonly known as: LIPITOR Take 40 mg by mouth every morning.   carvedilol  12.5 MG tablet Commonly known as: COREG  Take 1 tablet (12.5 mg total) by mouth 2 (two) times daily with a meal. What changed: medication strength   clonazePAM  0.5 MG tablet Commonly known as: KLONOPIN  Take 1 tablet (0.5 mg total) by mouth 2 (two) times daily as needed for anxiety.   clopidogrel  75 MG tablet Commonly known as: PLAVIX  Take 75 mg by mouth daily.   ergocalciferol 1.25 MG (50000 UT) capsule Commonly known as: VITAMIN D2 Take 50,000 Units by mouth every 30 (thirty) days.   famotidine  20 MG tablet Commonly known as: PEPCID  Take 20 mg by mouth 2 (two) times daily.   hydrOXYzine  25 MG tablet Commonly known as: ATARAX  Take 25 mg by mouth every 12 (twelve) hours as needed for anxiety.   isosorbide  mononitrate 30 MG 24 hr tablet Commonly known as: IMDUR  Take 3 tablets (90 mg total) by mouth every morning. Start taking on: July 05, 2024 What changed:  medication strength how much to take   lamoTRIgine  100 MG tablet Commonly known as: LAMICTAL  Take 100 mg by mouth 2 (two) times daily.   Lantus  SoloStar 100 UNIT/ML Solostar Pen Generic drug: insulin  glargine Inject 50 Units into the skin 2 (two) times daily.   multivitamin Tabs tablet Take 1 tablet by mouth at bedtime.   NovoLOG  FlexPen 100 UNIT/ML FlexPen Generic drug: insulin  aspart Inject 15 Units into the skin 4 (four) times daily. 4-5 times daily   ondansetron  4 MG disintegrating tablet Commonly known as: ZOFRAN -ODT Take 2 mg by mouth every 6 (six) hours as needed.   ondansetron  4 MG tablet Commonly known as: ZOFRAN  Take 1 tablet (4 mg total) by mouth  every 6 (six) hours as needed for nausea or vomiting.   oxyCODONE  5 MG immediate release tablet Commonly known as: Oxy IR/ROXICODONE  Take 1 tablet (5 mg total) by mouth every 6 (six) hours as needed for severe pain (pain score 7-10).   pantoprazole  40 MG tablet Commonly known as: PROTONIX  Take 40 mg by mouth 2 (two) times daily before a meal.   promethazine  25 MG tablet Commonly known as: PHENERGAN  Take 25 mg by mouth every 6 (six) hours as needed for nausea or vomiting.   sertraline  100 MG tablet Commonly known as: ZOLOFT  Take 100 mg by mouth daily.   Symbicort 160-4.5 MCG/ACT inhaler Generic drug: budesonide-formoterol Inhale 2 puffs into the lungs daily at 6 (six) AM.        Follow-up Information     Medicaid/[employer]. Call.   Why: Please call 3 days in advance of appointment to schedule Medicaid transportation. Contact informationShip broker Service Telephone Numbers: [phone]        [facility]. Go on 07/06/2024.   Why: Please arrive to first appointment 06:30am on Tuesday,  10/21 for dialysis.   (After this first appointment you will continue to go Tuesday, Thursday, Saturday at 06:45am). Contact information: [address] [place], [place], [place] [address] [phone]               Discharge Exam: Fredricka Weights   07/01/24 1500 07/02/24 1335 07/02/24 1615  Weight: 99.8 kg 99.2 kg 98.4 kg   General exam: Alert, awake, oriented x 3; still reporting some discomfort where right IJ dialysis catheter is located.  Denies shortness of breath and is currently afebrile. Respiratory system: Good saturation on room air. Cardiovascular system:RRR.  Rubs or gallops; no JVD. Gastrointestinal system: Abdomen is obese, nondistended, soft and nontender. No organomegaly or masses felt. Normal bowel sounds heard. Central nervous system: Alert and oriented. No focal neurological deficits. Extremities: No  cyanosis or clubbing.  No edema on exam. Skin: No petechiae.  Right IJ tunneled hemodialysis catheter in place.  No drainage. Psychiatry: Judgement and insight appear normal. Mood & affect appropriate.   Condition at discharge: Stable and improved.  The results of significant diagnostics from this hospitalization (including imaging, microbiology, ancillary and laboratory) are listed below for reference.   Imaging Studies: DG Chest Port 1 View Result Date: 07/01/2024 EXAM: 1 VIEW XRAY OF THE CHEST 07/01/2024 11:49:00 AM COMPARISON: 06/27/2024 CLINICAL HISTORY: [id number] Vascular dialysis catheter in place [phone]. S/p dialysis cath placement. FINDINGS: LINES, TUBES AND DEVICES: Right internal jugular dialysis catheter with distal tip in the SVC (superior vena cava). LUNGS AND PLEURA: No focal pulmonary opacity. No pulmonary edema. No pleural effusion. No pneumothorax. HEART AND MEDIASTINUM: No acute abnormality of the cardiac and mediastinal silhouettes. BONES AND SOFT TISSUES: No acute osseous abnormality. IMPRESSION: 1. Right internal jugular dialysis catheter with distal tip in the superior vena cava. 2. No pneumothorax. Electronically signed by: Lynwood Seip MD 07/01/2024 12:20 PM EDT RP Workstation: [id number]   DG C-Arm 1-60 Min-No Report Result Date: 07/01/2024 Fluoroscopy was utilized by the requesting physician.  No radiographic interpretation.   US  RENAL Result Date: 06/27/2024 CLINICAL DATA:  [id number] AKI (acute kidney injury) [id number]. EXAM: RENAL / URINARY TRACT ULTRASOUND COMPLETE COMPARISON:  None Available. FINDINGS: Right Kidney: Renal measurements: 4.7 x 4.8 x 10.4 cm = volume: 121.6 mL. Echogenicity within normal limits. No mass or hydronephrosis visualized. Left Kidney: Renal measurements: 4.6 x 4.6 x 11.7 cm = volume: 128.1 mL. Echogenicity within normal limits. No mass or hydronephrosis visualized. Bladder: Appears normal for degree of bladder distention. Other: None. IMPRESSION:  Unremarkable renal ultrasound. Electronically Signed   By: Ree Molt M.D.   On: 06/27/2024 10:24   DG Chest Port 1 View Result Date: 06/27/2024 EXAM: 1 VIEW(S) XRAY OF THE CHEST 06/27/2024 07:58:45 AM COMPARISON: 06/26/2024 CLINICAL HISTORY: Chest pain and pressure FINDINGS: LINES, TUBES AND DEVICES: Monitor wires noted. LUNGS AND PLEURA: No focal pulmonary opacity. No pulmonary edema. No pleural effusion. No pneumothorax. HEART AND MEDIASTINUM: No acute abnormality of the cardiac and mediastinal silhouettes. BONES AND SOFT TISSUES: Intact thoracic cage with thoracic spondylosis and mild thoracic dextroscoliosis. IMPRESSION: 1. No acute cardiopulmonary abnormality. Electronically signed by: Waddell Calk MD 06/27/2024 08:14 AM EDT RP Workstation: [id number]   ECHOCARDIOGRAM COMPLETE Result Date: 06/26/2024    ECHOCARDIOGRAM REPORT   Patient Name:   CHARNETTE YOUNKIN Schmelter Date of Exam: 06/26/2024 Medical Rec #:  [id number]      Height:       63.0 in Accession #:    [id number]     Weight:  229.5 lb Date of Birth:  15-May-1961      BSA:          2.050 m Patient Age:    63 years       BP:           123/91 mmHg Patient Gender: F              HR:           66 bpm. Exam Location:  [facility] Procedure: 2D Echo, Cardiac Doppler and Color Doppler (Both Spectral and Color            Flow Doppler were utilized during procedure). Indications:    CHF-Acute Diastolic I50.31  History:        Patient has prior history of Echocardiogram examinations, most                 recent 05/06/2024. Arrythmias:Atrial Fibrillation; Risk                 Factors:Hypertension and Diabetes.  Sonographer:    Jayson Gaskins Referring Phys: [id number] BRENDA MORRISON IMPRESSIONS  1. Left ventricular ejection fraction, by estimation, is 60 to 65%. The left ventricle has normal function. The left ventricle has no regional wall motion abnormalities. There is mild concentric left ventricular hypertrophy. Left ventricular diastolic parameters are  indeterminate.  2. Right ventricular systolic function is normal. The right ventricular size is normal. There is normal pulmonary artery systolic pressure. The estimated right ventricular systolic pressure is 26.5 mmHg.  3. The mitral valve is grossly normal. Trivial mitral valve regurgitation.  4. The aortic valve is tricuspid. There is mild calcification of the aortic valve. Aortic valve regurgitation is not visualized. Aortic valve sclerosis is present, with no evidence of aortic valve stenosis. Aortic valve mean gradient measures 5.0 mmHg.  5. The inferior vena cava is normal in size with <50% respiratory variability, suggesting right atrial pressure of 8 mmHg. Comparison(s): Prior images reviewed side by side. LVEF 60-65%. Normal estimated RVSP. FINDINGS  Left Ventricle: Left ventricular ejection fraction, by estimation, is 60 to 65%. The left ventricle has normal function. The left ventricle has no regional wall motion abnormalities. The left ventricular internal cavity size was normal in size. There is  mild concentric left ventricular hypertrophy. Left ventricular diastolic parameters are indeterminate. Right Ventricle: The right ventricular size is normal. No increase in right ventricular wall thickness. Right ventricular systolic function is normal. There is normal pulmonary artery systolic pressure. The tricuspid regurgitant velocity is 2.15 m/s, and  with an assumed right atrial pressure of 8 mmHg, the estimated right ventricular systolic pressure is 26.5 mmHg. Left Atrium: Left atrial size was normal in size. Right Atrium: Right atrial size was normal in size. Pericardium: There is no evidence of pericardial effusion. Presence of epicardial fat layer. Mitral Valve: The mitral valve is grossly normal. Trivial mitral valve regurgitation. Tricuspid Valve: The tricuspid valve is grossly normal. Tricuspid valve regurgitation is trivial. Aortic Valve: The aortic valve is tricuspid. There is mild calcification  of the aortic valve. Aortic valve regurgitation is not visualized. Aortic valve sclerosis is present, with no evidence of aortic valve stenosis. Aortic valve mean gradient measures 5.0 mmHg. Aortic valve peak gradient measures 7.8 mmHg. Aortic valve area, by VTI measures 2.09 cm. Pulmonic Valve: The pulmonic valve was not well visualized. Pulmonic valve regurgitation is trivial. Aorta: The aortic root is normal in size and structure. Venous: The inferior vena cava was not well visualized. The  inferior vena cava is normal in size with less than 50% respiratory variability, suggesting right atrial pressure of 8 mmHg. IAS/Shunts: No atrial level shunt detected by color flow Doppler. Additional Comments: 3D was performed not requiring image post processing on an independent workstation and was indeterminate.  LEFT VENTRICLE PLAX 2D LVIDd:         4.60 cm   Diastology LVIDs:         3.00 cm   LV e' medial:    4.79 cm/s LV PW:         1.10 cm   LV E/e' medial:  18.4 LV IVS:        1.10 cm   LV e' lateral:   6.74 cm/s LVOT diam:     2.00 cm   LV E/e' lateral: 13.1 LV SV:         77 LV SV Index:   38 LVOT Area:     3.14 cm  RIGHT VENTRICLE RV S prime:     13.80 cm/s TAPSE (M-mode): 2.1 cm LEFT ATRIUM           Index        RIGHT ATRIUM           Index LA Vol (A2C): 66.4 ml 32.39 ml/m  RA Area:     14.90 cm LA Vol (A4C): 37.9 ml 18.49 ml/m  RA Volume:   38.70 ml  18.88 ml/m  AORTIC VALVE AV Area (Vmax):    2.18 cm AV Area (Vmean):   2.15 cm AV Area (VTI):     2.09 cm AV Vmax:           140.00 cm/s AV Vmean:          112.000 cm/s AV VTI:            0.370 m AV Peak Grad:      7.8 mmHg AV Mean Grad:      5.0 mmHg LVOT Vmax:         97.20 cm/s LVOT Vmean:        76.500 cm/s LVOT VTI:          0.246 m LVOT/AV VTI ratio: 0.66  AORTA Ao Root diam: 2.90 cm MITRAL VALVE               TRICUSPID VALVE MV Area (PHT): 2.39 cm    TR Peak grad:   18.5 mmHg MV Decel Time: 318 msec    TR Vmax:        215.00 cm/s MV E velocity:  88.10 cm/s MV A velocity: 84.30 cm/s  SHUNTS MV E/A ratio:  1.05        Systemic VTI:  0.25 m                            Systemic Diam: 2.00 cm Jayson Sierras MD Electronically signed by Jayson Sierras MD Signature Date/Time: 06/26/2024/3:25:41 PM    Final    CT ABDOMEN PELVIS WO CONTRAST Result Date: 06/26/2024 CLINICAL DATA:  Chest pain EXAM: CT ABDOMEN AND PELVIS WITHOUT CONTRAST TECHNIQUE: Multidetector CT imaging of the abdomen and pelvis was performed following the standard protocol without IV contrast. RADIATION DOSE REDUCTION: This exam was performed according to the departmental dose-optimization program which includes automated exposure control, adjustment of the mA and/or kV according to patient size and/or use of iterative reconstruction technique. COMPARISON:  05/28/2024 FINDINGS: Lower chest: No acute abnormality. Hepatobiliary: No focal  liver abnormality is seen. Status post cholecystectomy. No biliary dilatation. Pancreas: Unremarkable. No pancreatic ductal dilatation or surrounding inflammatory changes. Spleen: Normal in size without focal abnormality. Adrenals/Urinary Tract: Adrenal glands are within normal limits. Kidneys are well visualized bilaterally. The bladder is decompressed. Punctate nonobstructing renal stones are noted in the lower poles bilaterally. Stomach/Bowel: No obstructive or inflammatory changes of the colon are noted. The appendix has been surgically removed. Small bowel and stomach are within normal limits. Vascular/Lymphatic: Aortic atherosclerosis. No enlarged abdominal or pelvic lymph nodes. Reproductive: Status post hysterectomy. No adnexal masses. Other: No abdominal wall hernia or abnormality. No abdominopelvic ascites. Musculoskeletal: No acute or significant osseous findings. IMPRESSION: Punctate nonobstructing renal calculi. No other focal abnormality is noted. Electronically Signed   By: Oneil Devonshire M.D.   On: 06/26/2024 02:40   DG Chest Portable 1  View Result Date: 06/26/2024 CLINICAL DATA:  Chest pain for several hours, initial encounter EXAM: PORTABLE CHEST 1 VIEW COMPARISON:  05/05/2024 FINDINGS: The heart size and mediastinal contours are within normal limits. Both lungs are clear. The visualized skeletal structures are unremarkable. IMPRESSION: No active disease. Electronically Signed   By: Oneil Devonshire M.D.   On: 06/26/2024 02:37    Microbiology: Results for orders placed or performed during the hospital encounter of 06/04/23  Resp panel by RT-PCR (RSV, Flu A&B, Covid) Anterior Nasal Swab     Status: Abnormal   Collection Time: 06/04/23 10:40 PM   Specimen: Anterior Nasal Swab  Result Value Ref Range Status   SARS Coronavirus 2 by RT PCR POSITIVE (A) NEGATIVE Final    Comment: (NOTE) SARS-CoV-2 target nucleic acids are DETECTED.  The SARS-CoV-2 RNA is generally detectable in upper respiratory specimens during the acute phase of infection. Positive results are indicative of the presence of the identified virus, but do not rule out bacterial infection or co-infection with other pathogens not detected by the test. Clinical correlation with patient history and other diagnostic information is necessary to determine patient infection status. The expected result is Negative.  Fact Sheet for Patients: BloggerCourse.com  Fact Sheet for Healthcare Providers: SeriousBroker.it  This test is not yet approved or cleared by the [place]  FDA and  has been authorized for detection and/or diagnosis of SARS-CoV-2 by FDA under an Emergency Use Authorization (EUA).  This EUA will remain in effect (meaning this test can be used) for the duration of  the COVID-19 declaration under Section 564(b)(1) of the A ct, 21 U.S.C. section 360bbb-3(b)(1), unless the authorization is terminated or revoked sooner.     Influenza A by PCR NEGATIVE NEGATIVE Final   Influenza B by PCR NEGATIVE  NEGATIVE Final    Comment: (NOTE) The Xpert Xpress SARS-CoV-2/FLU/RSV plus assay is intended as an aid in the diagnosis of influenza from Nasopharyngeal swab specimens and should not be used as a sole basis for treatment. Nasal washings and aspirates are unacceptable for Xpert Xpress SARS-CoV-2/FLU/RSV testing.  Fact Sheet for Patients: BloggerCourse.com  Fact Sheet for Healthcare Providers: SeriousBroker.it  This test is not yet approved or cleared by the [place]  FDA and has been authorized for detection and/or diagnosis of SARS-CoV-2 by FDA under an Emergency Use Authorization (EUA). This EUA will remain in effect (meaning this test can be used) for the duration of the COVID-19 declaration under Section 564(b)(1) of the Act, 21 U.S.C. section 360bbb-3(b)(1), unless the authorization is terminated or revoked.     Resp Syncytial Virus by PCR NEGATIVE NEGATIVE Final    Comment: (  NOTE) Fact Sheet for Patients: BloggerCourse.com  Fact Sheet for Healthcare Providers: SeriousBroker.it  This test is not yet approved or cleared by the [place]  FDA and has been authorized for detection and/or diagnosis of SARS-CoV-2 by FDA under an Emergency Use Authorization (EUA). This EUA will remain in effect (meaning this test can be used) for the duration of the COVID-19 declaration under Section 564(b)(1) of the Act, 21 U.S.C. section 360bbb-3(b)(1), unless the authorization is terminated or revoked.  Performed at [facility], [address], [place], [place] [address]     Labs: CBC: Recent Labs  Lab 06/30/24 [phone] 07/01/24 0515 07/02/24 0446 07/03/24 0459 07/04/24 0407  WBC 4.5 4.0 4.1 4.8 4.8  HGB 7.5* 8.9* 8.9* 9.0* 8.9*  HCT 22.8* 26.7* 26.8* 27.3* 27.3*  MCV 91.9 89.9 91.5 90.7 90.4  PLT 153 155 165 165 172   Basic Metabolic Panel: Recent Labs  Lab 07/01/24 0515  07/01/24 0516 07/02/24 0446 07/02/24 0447 07/03/24 0459 07/04/24 0407  NA 136 136 137 137 136 137  K 4.0 4.0 3.6 3.7 3.4* 3.6  CL 100 101 100 100 98 98  CO2 21* 21* 26 26 26 25   GLUCOSE 144* 144* 145* 143* 159* 175*  BUN 58* 57* 35* 35* 22 30*  CREATININE 6.66* 6.63* 5.43* 5.13* 4.34* 5.73*  CALCIUM  8.9 9.0 8.7* 8.8* 9.0 8.9  MG 1.9  --  1.9  --  1.8 1.8  PHOS 6.6* 6.6*  --  4.8* 4.3 5.4*   Liver Function Tests: Recent Labs  Lab 06/30/24 0512 07/01/24 0516 07/02/24 0447 07/03/24 0459 07/04/24 0407  ALBUMIN  3.3* 3.4* 3.3* 3.5 3.6   CBG: Recent Labs  Lab 07/03/24 1110 07/03/24 1607 07/03/24 2104 07/04/24 0715 07/04/24 1109  GLUCAP 197* 238* 222* 173* 212*    Discharge time spent:  35 minutes.  Signed: Eric Nunnery, MD [facility] 07/04/2024

## 2024-07-05 LAB — SURGICAL PATHOLOGY

## 2024-07-05 NOTE — Progress Notes (Signed)
 Late note entry 10/20 0944 D/c noted. Contacted out-pt HD clinic[facility], to inform of d/c and to anticipate pt for first appt tomorrow as scheduled. D/c summ and last note faxed at this time, no further support needed.   Lavanda Suesan [facility] nav [phone]

## 2024-07-07 NOTE — Anesthesia Postprocedure Evaluation (Signed)
 Anesthesia Post Note  Patient: Gary Bultman  Procedure(s) Performed: EGD (ESOPHAGOGASTRODUODENOSCOPY)  Patient location during evaluation: Phase II Anesthesia Type: General Level of consciousness: awake Pain management: pain level controlled Vital Signs Assessment: post-procedure vital signs reviewed and stable Respiratory status: spontaneous breathing and respiratory function stable Cardiovascular status: blood pressure returned to baseline and stable Postop Assessment: no headache and no apparent nausea or vomiting Anesthetic complications: no Comments: Late entry   No notable events documented.   Last Vitals:  Vitals:   07/03/24 1953 07/04/24 0416  BP: (!) 150/81 125/88  Pulse: 72 71  Resp: 16 16  Temp: 36.8 C 36.6 C  SpO2: 98% 96%    Last Pain:  Vitals:   07/04/24 1025  TempSrc:   PainSc: 3                  Yvonna JINNY Bosworth

## 2024-07-11 ENCOUNTER — Ambulatory Visit: Payer: Self-pay | Admitting: Internal Medicine

## 2024-07-21 ENCOUNTER — Encounter (INDEPENDENT_AMBULATORY_CARE_PROVIDER_SITE_OTHER): Payer: Self-pay | Admitting: Gastroenterology

## 2024-08-30 ENCOUNTER — Emergency Department (HOSPITAL_COMMUNITY)

## 2024-08-30 ENCOUNTER — Other Ambulatory Visit: Payer: Self-pay

## 2024-08-30 ENCOUNTER — Encounter (HOSPITAL_COMMUNITY): Payer: Self-pay

## 2024-08-30 ENCOUNTER — Inpatient Hospital Stay (HOSPITAL_COMMUNITY)
Admission: EM | Admit: 2024-08-30 | Discharge: 2024-09-03 | DRG: 291 | Disposition: A | Discharge status: Home / Self Care | Attending: Family Medicine | Admitting: Family Medicine

## 2024-08-30 DIAGNOSIS — E875 Hyperkalemia: Principal | ICD-10-CM | POA: Diagnosis present

## 2024-08-30 DIAGNOSIS — Z8679 Personal history of other diseases of the circulatory system: Secondary | ICD-10-CM

## 2024-08-30 DIAGNOSIS — N186 End stage renal disease: Secondary | ICD-10-CM

## 2024-08-30 DIAGNOSIS — F319 Bipolar disorder, unspecified: Secondary | ICD-10-CM | POA: Diagnosis present

## 2024-08-30 DIAGNOSIS — M549 Dorsalgia, unspecified: Secondary | ICD-10-CM | POA: Diagnosis present

## 2024-08-30 DIAGNOSIS — Z6838 Body mass index (BMI) 38.0-38.9, adult: Secondary | ICD-10-CM

## 2024-08-30 DIAGNOSIS — Z9102 Food additives allergy status: Secondary | ICD-10-CM

## 2024-08-30 DIAGNOSIS — E785 Hyperlipidemia, unspecified: Secondary | ICD-10-CM | POA: Diagnosis present

## 2024-08-30 DIAGNOSIS — Z91013 Allergy to seafood: Secondary | ICD-10-CM

## 2024-08-30 DIAGNOSIS — Z886 Allergy status to analgesic agent status: Secondary | ICD-10-CM

## 2024-08-30 DIAGNOSIS — E1122 Type 2 diabetes mellitus with diabetic chronic kidney disease: Secondary | ICD-10-CM | POA: Diagnosis present

## 2024-08-30 DIAGNOSIS — D638 Anemia in other chronic diseases classified elsewhere: Secondary | ICD-10-CM

## 2024-08-30 DIAGNOSIS — Z881 Allergy status to other antibiotic agents status: Secondary | ICD-10-CM

## 2024-08-30 DIAGNOSIS — Z7902 Long term (current) use of antithrombotics/antiplatelets: Secondary | ICD-10-CM

## 2024-08-30 DIAGNOSIS — D631 Anemia in chronic kidney disease: Secondary | ICD-10-CM | POA: Diagnosis present

## 2024-08-30 DIAGNOSIS — Z8616 Personal history of COVID-19: Secondary | ICD-10-CM

## 2024-08-30 DIAGNOSIS — G8929 Other chronic pain: Secondary | ICD-10-CM | POA: Diagnosis present

## 2024-08-30 DIAGNOSIS — K766 Portal hypertension: Secondary | ICD-10-CM | POA: Diagnosis present

## 2024-08-30 DIAGNOSIS — E1165 Type 2 diabetes mellitus with hyperglycemia: Secondary | ICD-10-CM

## 2024-08-30 DIAGNOSIS — Z79899 Other long term (current) drug therapy: Secondary | ICD-10-CM

## 2024-08-30 DIAGNOSIS — Z7722 Contact with and (suspected) exposure to environmental tobacco smoke (acute) (chronic): Secondary | ICD-10-CM | POA: Diagnosis present

## 2024-08-30 DIAGNOSIS — Z7901 Long term (current) use of anticoagulants: Secondary | ICD-10-CM

## 2024-08-30 DIAGNOSIS — I5032 Chronic diastolic (congestive) heart failure: Secondary | ICD-10-CM | POA: Diagnosis present

## 2024-08-30 DIAGNOSIS — N2581 Secondary hyperparathyroidism of renal origin: Secondary | ICD-10-CM | POA: Diagnosis present

## 2024-08-30 DIAGNOSIS — K3184 Gastroparesis: Secondary | ICD-10-CM | POA: Diagnosis present

## 2024-08-30 DIAGNOSIS — I132 Hypertensive heart and chronic kidney disease with heart failure and with stage 5 chronic kidney disease, or end stage renal disease: Principal | ICD-10-CM | POA: Diagnosis present

## 2024-08-30 DIAGNOSIS — Z90721 Acquired absence of ovaries, unilateral: Secondary | ICD-10-CM

## 2024-08-30 DIAGNOSIS — R7989 Other specified abnormal findings of blood chemistry: Secondary | ICD-10-CM

## 2024-08-30 DIAGNOSIS — I480 Paroxysmal atrial fibrillation: Secondary | ICD-10-CM | POA: Diagnosis present

## 2024-08-30 DIAGNOSIS — Z992 Dependence on renal dialysis: Secondary | ICD-10-CM

## 2024-08-30 DIAGNOSIS — K219 Gastro-esophageal reflux disease without esophagitis: Secondary | ICD-10-CM | POA: Diagnosis present

## 2024-08-30 DIAGNOSIS — Z794 Long term (current) use of insulin: Secondary | ICD-10-CM

## 2024-08-30 DIAGNOSIS — E1143 Type 2 diabetes mellitus with diabetic autonomic (poly)neuropathy: Secondary | ICD-10-CM | POA: Diagnosis present

## 2024-08-30 DIAGNOSIS — F411 Generalized anxiety disorder: Secondary | ICD-10-CM

## 2024-08-30 DIAGNOSIS — Z9049 Acquired absence of other specified parts of digestive tract: Secondary | ICD-10-CM

## 2024-08-30 DIAGNOSIS — E66812 Obesity, class 2: Secondary | ICD-10-CM | POA: Diagnosis present

## 2024-08-30 DIAGNOSIS — D509 Iron deficiency anemia, unspecified: Secondary | ICD-10-CM | POA: Diagnosis present

## 2024-08-30 DIAGNOSIS — Z882 Allergy status to sulfonamides status: Secondary | ICD-10-CM

## 2024-08-30 DIAGNOSIS — Z955 Presence of coronary angioplasty implant and graft: Secondary | ICD-10-CM

## 2024-08-30 DIAGNOSIS — Z86718 Personal history of other venous thrombosis and embolism: Secondary | ICD-10-CM

## 2024-08-30 DIAGNOSIS — I2510 Atherosclerotic heart disease of native coronary artery without angina pectoris: Secondary | ICD-10-CM | POA: Diagnosis present

## 2024-08-30 DIAGNOSIS — K5900 Constipation, unspecified: Secondary | ICD-10-CM | POA: Diagnosis not present

## 2024-08-30 DIAGNOSIS — K654 Sclerosing mesenteritis: Secondary | ICD-10-CM | POA: Diagnosis present

## 2024-08-30 DIAGNOSIS — Z888 Allergy status to other drugs, medicaments and biological substances status: Secondary | ICD-10-CM

## 2024-08-30 DIAGNOSIS — M25532 Pain in left wrist: Secondary | ICD-10-CM | POA: Diagnosis not present

## 2024-08-30 DIAGNOSIS — J129 Viral pneumonia, unspecified: Secondary | ICD-10-CM | POA: Diagnosis present

## 2024-08-30 DIAGNOSIS — Z91041 Radiographic dye allergy status: Secondary | ICD-10-CM

## 2024-08-30 DIAGNOSIS — Z90710 Acquired absence of both cervix and uterus: Secondary | ICD-10-CM

## 2024-08-30 DIAGNOSIS — I252 Old myocardial infarction: Secondary | ICD-10-CM

## 2024-08-30 DIAGNOSIS — Z7951 Long term (current) use of inhaled steroids: Secondary | ICD-10-CM

## 2024-08-30 LAB — CBC WITH DIFFERENTIAL/PLATELET
Abs Immature Granulocytes: 0.06 K/uL (ref 0.00–0.07)
Basophils Absolute: 0 K/uL (ref 0.0–0.1)
Basophils Relative: 1 %
Eosinophils Absolute: 0.2 K/uL (ref 0.0–0.5)
Eosinophils Relative: 2 %
HCT: 22.4 % — ABNORMAL LOW (ref 36.0–46.0)
Hemoglobin: 7.2 g/dL — ABNORMAL LOW (ref 12.0–15.0)
Immature Granulocytes: 1 %
Lymphocytes Relative: 18 %
Lymphs Abs: 1.2 K/uL (ref 0.7–4.0)
MCH: 30.5 pg (ref 26.0–34.0)
MCHC: 32.1 g/dL (ref 30.0–36.0)
MCV: 94.9 fL (ref 80.0–100.0)
Monocytes Absolute: 0.4 K/uL (ref 0.1–1.0)
Monocytes Relative: 6 %
Neutro Abs: 4.8 K/uL (ref 1.7–7.7)
Neutrophils Relative %: 72 %
Platelets: 192 K/uL (ref 150–400)
RBC: 2.36 MIL/uL — ABNORMAL LOW (ref 3.87–5.11)
RDW: 15.8 % — ABNORMAL HIGH (ref 11.5–15.5)
WBC: 6.6 K/uL (ref 4.0–10.5)
nRBC: 0 % (ref 0.0–0.2)

## 2024-08-30 LAB — COMPREHENSIVE METABOLIC PANEL WITH GFR
ALT: 6 U/L (ref 0–44)
AST: 12 U/L — ABNORMAL LOW (ref 15–41)
Albumin: 3.5 g/dL (ref 3.5–5.0)
Alkaline Phosphatase: 70 U/L (ref 38–126)
Anion gap: 16 — ABNORMAL HIGH (ref 5–15)
BUN: 43 mg/dL — ABNORMAL HIGH (ref 8–23)
CO2: 20 mmol/L — ABNORMAL LOW (ref 22–32)
Calcium: 8.9 mg/dL (ref 8.9–10.3)
Chloride: 96 mmol/L — ABNORMAL LOW (ref 98–111)
Creatinine, Ser: 9.19 mg/dL — ABNORMAL HIGH (ref 0.44–1.00)
GFR, Estimated: 4 mL/min — ABNORMAL LOW (ref >60)
Glucose, Bld: 120 mg/dL — ABNORMAL HIGH (ref 70–99)
Potassium: 5.7 mmol/L — ABNORMAL HIGH (ref 3.5–5.1)
Sodium: 133 mmol/L — ABNORMAL LOW (ref 135–145)
Total Bilirubin: 0.6 mg/dL (ref 0.0–1.2)
Total Protein: 9.3 g/dL — ABNORMAL HIGH (ref 6.5–8.1)

## 2024-08-30 LAB — TROPONIN T, HIGH SENSITIVITY
Troponin T High Sensitivity: 108 ng/L (ref 0–19)
Troponin T High Sensitivity: 106 ng/L (ref 0–19)

## 2024-08-30 LAB — LIPASE, BLOOD: Lipase: 45 U/L (ref 11–51)

## 2024-08-30 MED ORDER — HYDROCODONE-ACETAMINOPHEN 5-325 MG PO TABS
1.0000 | ORAL_TABLET | Freq: Once | ORAL | Status: AC
  Administered 2024-08-30: 22:00:00 1 via ORAL
  Filled 2024-08-30: qty 1

## 2024-08-30 MED ORDER — ONDANSETRON HCL 4 MG/2ML IJ SOLN
4.0000 mg | Freq: Once | INTRAMUSCULAR | Status: AC
  Administered 2024-08-30: 22:00:00 4 mg via INTRAVENOUS
  Filled 2024-08-30: qty 2

## 2024-08-30 MED ORDER — ONDANSETRON HCL 4 MG/2ML IJ SOLN
4.0000 mg | Freq: Once | INTRAMUSCULAR | Status: AC
  Administered 2024-08-30: 20:00:00 4 mg via INTRAVENOUS
  Filled 2024-08-30: qty 2

## 2024-08-30 MED ORDER — SODIUM ZIRCONIUM CYCLOSILICATE 5 G PO PACK
10.0000 g | PACK | Freq: Once | ORAL | Status: AC
  Administered 2024-08-30: 23:00:00 10 g via ORAL
  Filled 2024-08-30: qty 2

## 2024-08-30 MED ORDER — HYDROMORPHONE HCL 1 MG/ML IJ SOLN
0.5000 mg | Freq: Once | INTRAMUSCULAR | Status: AC
  Administered 2024-08-30: 20:00:00 0.5 mg via INTRAVENOUS
  Filled 2024-08-30: qty 0.5

## 2024-08-30 NOTE — ED Triage Notes (Signed)
 Pt arrived via POV c/o dialysis access pain that radiates from her neck down. Pt reports missing dialysis yesterday due to the pain and not feeling well.

## 2024-08-30 NOTE — H&P (Incomplete)
 History and Physical    Patient: [name] DOB: 01-Nov-1960 DOA: 08/30/2024 DOS: the patient was seen and examined on 08/30/2024 PCP: System, Provider Not In  Patient coming from: {Point_of_Origin:26777}  Chief Complaint:  Chief Complaint  Patient presents with   Vascular Access Problem   HPI: [name] is a 63 y.o. female with medical history significant of ***  Review of Systems: {ROS_Text:26778} Past Medical History:  Diagnosis Date   Anxiety    Asthma    Atrial fibrillation (HCC)    Bipolar 1 disorder (HCC)    Chronic back pain    Chronic chest pain    Diabetes mellitus without complication (HCC)    Dialysis patient    [facility]   DVT (deep venous thrombosis) (HCC) 2017   Gastroparesis    GERD (gastroesophageal reflux disease)    GI bleed 11/2023   required 2 units PRBC transfusion   Hyperlipemia    Hypertension    IBS (irritable bowel syndrome)    Normal cardiac stress test 02/14/2014   [facility]   Panic attacks    Past Surgical History:  Procedure Laterality Date   ABDOMINAL HYSTERECTOMY  2001   APPENDECTOMY  10/2005   CHOLECYSTECTOMY  10/2005   COLONOSCOPY  2007   [facility] ([name]pt reports hemorrhoids   ESOPHAGOGASTRODUODENOSCOPY  02/28/2006   [name], normal on daily PPI, myultiple hyperplastic polyps   ESOPHAGOGASTRODUODENOSCOPY N/A 06/29/2024   Procedure: EGD (ESOPHAGOGASTRODUODENOSCOPY);  Surgeon: Eartha Flavors, Toribio, MD;  Location: [facility];  Service: Gastroenterology;  Laterality: N/A;   ESOPHAGOGASTRODUODENOSCOPY N/A 06/30/2024   Procedure: EGD (ESOPHAGOGASTRODUODENOSCOPY);  Surgeon: Shaaron Lamar HERO, MD;  Location: [facility];  Service: Endoscopy;  Laterality: N/A;   INSERTION OF DIALYSIS CATHETER Right 07/01/2024   Procedure: INSERTION OF DIALYSIS CATHETER, tunneled catheter;  Surgeon: Kallie [name] BROCKS, MD;  Location: [facility];  Service: General;  Laterality: Right;   MOUTH SURGERY      RIGHT HEART CATH N/A 04/25/2023   Procedure: RIGHT HEART CATH;  Surgeon: Verlin Lonni BIRCH, MD;  Location: [facility];  Service: Cardiovascular;  Laterality: N/A;   RIGHT OOPHORECTOMY  1999   Social History:  reports that she is a non-smoker but has been exposed to tobacco smoke. She has never used smokeless tobacco. She reports that she does not drink alcohol and does not use drugs.  Allergies[1]  Family History  Problem Relation Age of Onset   Cirrhosis Mother 13       ?etiology   Cirrhosis Father        ?etiology   Cirrhosis Sister 37       ?etiology   Diverticulitis Sister     Prior to Admission medications  Medication Sig Start Date End Date Taking? Authorizing Provider  albuterol  (VENTOLIN  HFA) 108 (90 Base) MCG/ACT inhaler Inhale 2 puffs into the lungs every 6 (six) hours as needed for wheezing or shortness of breath.    [provider]  apixaban  (ELIQUIS ) 5 MG TABS tablet Take 1 tablet (5 mg total) by mouth 2 (two) times daily. Continue hold medication until follow up/clerance by gastroenterology service. 07/04/24   Ricky Fines, MD  atorvastatin  (LIPITOR) 40 MG tablet Take 40 mg by mouth every morning.    [provider]  carvedilol  (COREG ) 12.5 MG tablet Take 1 tablet (12.5 mg total) by mouth 2 (two) times daily with a meal. 07/04/24   Ricky Fines, MD  clonazePAM  (KLONOPIN ) 0.5 MG tablet Take 1 tablet (  0.5 mg total) by mouth 2 (two) times daily as needed for anxiety. 05/18/24   Haze Lonni PARAS, MD  clopidogrel  (PLAVIX ) 75 MG tablet Take 75 mg by mouth daily.    [provider]  ergocalciferol (VITAMIN D2) 1.25 MG (50000 UT) capsule Take 50,000 Units by mouth every 30 (thirty) days. 06/01/24 06/01/25  [provider]  famotidine  (PEPCID ) 20 MG tablet Take 20 mg by mouth 2 (two) times daily.    [provider]  hydrOXYzine  (ATARAX ) 25 MG tablet Take 25 mg by mouth every 12 (twelve) hours as needed for anxiety.     [provider]  isosorbide  mononitrate (IMDUR ) 30 MG 24 hr tablet Take 3 tablets (90 mg total) by mouth every morning. 07/05/24   Ricky Fines, MD  lamoTRIgine  (LAMICTAL ) 100 MG tablet Take 100 mg by mouth 2 (two) times daily.    [provider]  LANTUS  SOLOSTAR 100 UNIT/ML Solostar Pen Inject 50 Units into the skin 2 (two) times daily.    [provider]  multivitamin (RENA-VIT) TABS tablet Take 1 tablet by mouth at bedtime. 07/04/24   Ricky Fines, MD  NOVOLOG  FLEXPEN 100 UNIT/ML FlexPen Inject 15 Units into the skin 4 (four) times daily. 4-5 times daily 05/13/15   [provider]  ondansetron  (ZOFRAN ) 4 MG tablet Take 1 tablet (4 mg total) by mouth every 6 (six) hours as needed for nausea or vomiting. 05/28/24   Jerral Meth, MD  ondansetron  (ZOFRAN -ODT) 4 MG disintegrating tablet Take 2 mg by mouth every 6 (six) hours as needed. 06/20/24   [provider]  oxyCODONE  (OXY IR/ROXICODONE ) 5 MG immediate release tablet Take 1 tablet (5 mg total) by mouth every 6 (six) hours as needed for severe pain (pain score 7-10). 07/04/24   Ricky Fines, MD  pantoprazole  (PROTONIX ) 40 MG tablet Take 40 mg by mouth 2 (two) times daily before a meal.    [provider]  promethazine  (PHENERGAN ) 25 MG tablet Take 25 mg by mouth every 6 (six) hours as needed for nausea or vomiting.    [provider]  sertraline  (ZOLOFT ) 100 MG tablet Take 100 mg by mouth daily.    [provider]  SYMBICORT 160-4.5 MCG/ACT inhaler Inhale 2 puffs into the lungs daily at 6 (six) AM. Patient not taking: Reported on 06/26/2024 02/03/24   [provider]    Physical Exam: Vitals:   08/30/24 2203 08/30/24 2215 08/30/24 2240 08/30/24 2330  BP: (!) 148/74 (!) 140/52 (!) 127/95 136/61  Pulse: 74 74 71 74  Resp: (!) 23 (!) 21 20 18   Temp:    98.1 F (36.7 C)  TempSrc:    Oral  SpO2: 98% 97% 97% 96%  Weight:      Height:       *** Data  Reviewed: {Tip this will not be part of the note when signed- Document your independent interpretation of telemetry tracing, EKG, lab, Radiology test or any other diagnostic tests. Add any new diagnostic test ordered today. (Optional):26781} {Results:26384}  Assessment and Plan: No notes have been filed under this hospital service. Service: Hospitalist     Advance Care Planning:   Code Status: Prior ***  Consults: ***  Family Communication: ***  Severity of Illness: {Observation/Inpatient:21159}  Author: Posey Maier, DO 08/30/2024 11:46 PM  For on call review [email or web address].     [1]  Allergies Allergen Reactions   Gelatin Swelling    No jello of any kind   Iodinated Contrast  Media Anaphylaxis, Hives, Itching and Swelling   Methylprednisolone  Sodium Succ Shortness Of Breath   Dicyclomine Hcl Anxiety and Other (See Comments)    shaky and sick GI upset, sick to stomach   Doxycycline Hives and Rash    rash   Glutamic Acid Itching and Swelling   Ketorolac Hives and Rash    Tolerated Toradol in the ED without side effect, hives, or complaint on 02/17/2022   Lisinopril  Swelling    Angioedema (facial, lip, or tongue swelling)   Metformin Nausea And Vomiting and Other (See Comments)    Dyspepsia sick to stomach It made me sick where I couldn't get out of the bed   Nitroglycerin  Nausea And Vomiting and Swelling    Pt states it makes her too sick   Nsaids Other (See Comments)    Stomach pain/bleeding   Other Itching, Swelling and Other (See Comments)    Solu-Medrol  Mix-O-Vial   Quetiapine Other (See Comments)   Vancomycin     Itching and erythema at infusion site within a few minutes of starting infusion. No systemic evidence of Red Man Syndrome   Blueberry Flavoring Agent (Non-Screening) Nausea And Vomiting and Rash   Cephalexin  Nausea And Vomiting and Nausea Only    sick to stomach   Fish Allergy Rash   Ibuprofen Nausea And Vomiting    GI upset Reports  stomach upset 2/2 hx gastric polyps. Not true allergy.   Nitrofuran Derivatives Itching and Nausea And Vomiting    sick to stomach   Shellfish Allergy Itching, Swelling and Rash   Strawberry Flavoring Agent (Non-Screening) Nausea And Vomiting and Rash   Sulfa Antibiotics Nausea And Vomiting and Other (See Comments)    It just makes me real sick

## 2024-08-30 NOTE — ED Provider Notes (Signed)
 [place] EMERGENCY DEPARTMENT AT [facility] Provider Note  CSN: [id number] Arrival date & time: 08/30/24 1817  Chief Complaint(s) Vascular Access Problem  HPI [name] is a 63 y.o. female history of atrial fibrillation, diabetes, bipolar disorder, prior DVT on Eliquis , hypertension, hyperlipidemia presenting to the emergency department with multiple complaints.  Patient reports that she has been having pain in her right neck around where her dialysis catheter is located.  She reports it is functioning normally but it is bothering her.  She is not taking anything at home for her symptoms.  She also reports intermittent chest pain, which comes and goes.  No specific trigger.  No cough, runny nose, sore throat.  She reports occasional nausea.  Not pleuritic.  Reports compliance with her medicines.  Occasionally also has some abdominal pain.  Symptoms not exertional and intermittent.  She denies any vomiting, hematochezia, melena, dysuria.  Denies any diarrhea.  Past Medical History Past Medical History:  Diagnosis Date   Anxiety    Asthma    Atrial fibrillation (HCC)    Bipolar 1 disorder (HCC)    Chronic back pain    Chronic chest pain    Diabetes mellitus without complication [facility])    Dialysis patient    Earlean Everts, Saturday   DVT (deep venous thrombosis) (HCC) 2017   Gastroparesis    GERD (gastroesophageal reflux disease)    GI bleed 11/2023   required 2 units PRBC transfusion   Hyperlipemia    Hypertension    IBS (irritable bowel syndrome)    Normal cardiac stress test 02/14/2014   [facility]   Panic attacks    Patient Active Problem List   Diagnosis Date Noted   Hyperkalemia 08/30/2024   ESRD on dialysis (HCC) 07/04/2024   Acute on chronic anemia 06/28/2024   Chronic anticoagulation 06/28/2024   Enteritis 06/28/2024   Near syncope 05/06/2024   CKD (chronic kidney disease) stage 4, GFR 15-29 ml/min (HCC) 05/06/2024   PAF (paroxysmal atrial  fibrillation) (HCC) 05/06/2024   Chest pain 05/05/2024   Chronic heart failure with preserved ejection fraction (HFpEF) (HCC) 06/08/2023   Paroxysmal atrial fibrillation (HCC) 06/08/2023   Hypocalcemia 06/07/2023   Acute cystitis 06/07/2023   Anemia 06/06/2023   Acute kidney injury superimposed on stage 5 chronic kidney disease, not on chronic dialysis (HCC) 06/05/2023   Hypokalemia 06/05/2023   COVID-19 virus infection 06/05/2023   Thrombocytopenia 06/05/2023   Hypoalbuminemia due to protein-calorie malnutrition 06/05/2023   Atrial fibrillation, chronic (HCC) 06/05/2023   Orthostatic hypotension 05/02/2023   Hyponatremia 05/02/2023   Chronic diastolic heart failure (HCC) 04/23/2023   Unstable angina (HCC) 04/22/2023   Stage 3b chronic kidney disease (HCC) 04/22/2023   Type 2 diabetes mellitus with complication, with long-term current use of insulin  (HCC) 04/22/2023   Acute on chronic heart failure with preserved ejection fraction (HCC) 04/22/2023   Iron deficiency anemia 04/22/2023   RLQ abdominal pain 04/18/2023   Chest pain with high risk for cardiac etiology 04/17/2023   Atypical chest pain 04/16/2023   Acute kidney injury superimposed on chronic kidney disease 04/16/2023   Type 2 diabetes mellitus with hyperglycemia (HCC) 04/16/2023   Persistent atrial fibrillation (HCC) 04/16/2023   Obesity, Class III, BMI 40-49.9 (morbid obesity) (HCC) 04/16/2023   Erroneous encounter - disregard 02/14/2023   Left-sided weakness 02/08/2023   BRBPR (bright red blood per rectum) 04/07/2012   GASTRIC POLYP 10/21/2006   Mixed hyperlipidemia 10/21/2006   DISORDER, BIPOLAR NOS 10/21/2006  DEPRESSION 10/21/2006   Migraine headache 10/21/2006   Essential hypertension 10/21/2006   Coronary artery disease 10/21/2006   GERD 10/21/2006   LOW BACK PAIN 10/21/2006   Home Medication(s) Prior to Admission medications  Medication Sig Start Date End Date Taking? Authorizing Provider  albuterol   (VENTOLIN  HFA) 108 (90 Base) MCG/ACT inhaler Inhale 2 puffs into the lungs every 6 (six) hours as needed for wheezing or shortness of breath.    [provider]  apixaban  (ELIQUIS ) 5 MG TABS tablet Take 1 tablet (5 mg total) by mouth 2 (two) times daily. Continue hold medication until follow up/clerance by gastroenterology service. 07/04/24   Ricky Fines, MD  atorvastatin  (LIPITOR) 40 MG tablet Take 40 mg by mouth every morning.    [provider]  carvedilol  (COREG ) 12.5 MG tablet Take 1 tablet (12.5 mg total) by mouth 2 (two) times daily with a meal. 07/04/24   Ricky Fines, MD  clonazePAM  (KLONOPIN ) 0.5 MG tablet Take 1 tablet (0.5 mg total) by mouth 2 (two) times daily as needed for anxiety. 05/18/24   Haze Lonni PARAS, MD  clopidogrel  (PLAVIX ) 75 MG tablet Take 75 mg by mouth daily.    [provider]  ergocalciferol (VITAMIN D2) 1.25 MG (50000 UT) capsule Take 50,000 Units by mouth every 30 (thirty) days. 06/01/24 06/01/25  [provider]  famotidine  (PEPCID ) 20 MG tablet Take 20 mg by mouth 2 (two) times daily.    [provider]  hydrOXYzine  (ATARAX ) 25 MG tablet Take 25 mg by mouth every 12 (twelve) hours as needed for anxiety.    [provider]  isosorbide  mononitrate (IMDUR ) 30 MG 24 hr tablet Take 3 tablets (90 mg total) by mouth every morning. 07/05/24   Ricky Fines, MD  lamoTRIgine  (LAMICTAL ) 100 MG tablet Take 100 mg by mouth 2 (two) times daily.    [provider]  LANTUS  SOLOSTAR 100 UNIT/ML Solostar Pen Inject 50 Units into the skin 2 (two) times daily.    [provider]  multivitamin (RENA-VIT) TABS tablet Take 1 tablet by mouth at bedtime. 07/04/24   Ricky Fines, MD  NOVOLOG  FLEXPEN 100 UNIT/ML FlexPen Inject 15 Units into the skin 4 (four) times daily. 4-5 times daily 05/13/15   [provider]  ondansetron  (ZOFRAN ) 4 MG tablet Take 1 tablet (4 mg total) by mouth every 6 (six) hours as  needed for nausea or vomiting. 05/28/24   Jerral Meth, MD  ondansetron  (ZOFRAN -ODT) 4 MG disintegrating tablet Take 2 mg by mouth every 6 (six) hours as needed. 06/20/24   [provider]  oxyCODONE  (OXY IR/ROXICODONE ) 5 MG immediate release tablet Take 1 tablet (5 mg total) by mouth every 6 (six) hours as needed for severe pain (pain score 7-10). 07/04/24   Ricky Fines, MD  pantoprazole  (PROTONIX ) 40 MG tablet Take 40 mg by mouth 2 (two) times daily before a meal.    [provider]  promethazine  (PHENERGAN ) 25 MG tablet Take 25 mg by mouth every 6 (six) hours as needed for nausea or vomiting.    [provider]  sertraline  (ZOLOFT ) 100 MG tablet Take 100 mg by mouth daily.    [provider]  SYMBICORT 160-4.5 MCG/ACT inhaler Inhale 2 puffs into the lungs daily at 6 (six) AM. Patient not taking: Reported on 06/26/2024 02/03/24   [provider]  Past Surgical History Past Surgical History:  Procedure Laterality Date   ABDOMINAL HYSTERECTOMY  2001   APPENDECTOMY  10/2005   CHOLECYSTECTOMY  10/2005   COLONOSCOPY  2007   Patel ([name])-pt reports hemorrhoids   ESOPHAGOGASTRODUODENOSCOPY  02/28/2006   [name], normal on daily PPI, myultiple hyperplastic polyps   ESOPHAGOGASTRODUODENOSCOPY N/A 06/29/2024   Procedure: EGD (ESOPHAGOGASTRODUODENOSCOPY);  Surgeon: Eartha Flavors, Toribio, MD;  Location: [facility] ENDO SUITE;  Service: Gastroenterology;  Laterality: N/A;   ESOPHAGOGASTRODUODENOSCOPY N/A 06/30/2024   Procedure: EGD (ESOPHAGOGASTRODUODENOSCOPY);  Surgeon: Shaaron Lamar HERO, MD;  Location: [facility] ENDO SUITE;  Service: Endoscopy;  Laterality: N/A;   INSERTION OF DIALYSIS CATHETER Right 07/01/2024   Procedure: INSERTION OF DIALYSIS CATHETER, tunneled catheter;  Surgeon: Kallie [name] BROCKS, MD;  Location: [facility] ORS;   Service: General;  Laterality: Right;   MOUTH SURGERY     RIGHT HEART CATH N/A 04/25/2023   Procedure: RIGHT HEART CATH;  Surgeon: Verlin Lonni BIRCH, MD;  Location: [facility] INVASIVE CV LAB;  Service: Cardiovascular;  Laterality: N/A;   RIGHT OOPHORECTOMY  1999   Family History Family History  Problem Relation Age of Onset   Cirrhosis Mother 72       ?etiology   Cirrhosis Father        ?etiology   Cirrhosis Sister 45       ?etiology   Diverticulitis Sister     Social History Social History[1] Allergies Gelatin, Iodinated contrast media, Methylprednisolone  sodium succ, Dicyclomine hcl, Doxycycline, Glutamic acid, Ketorolac, Lisinopril , Metformin, Nitroglycerin , Nsaids, Other, Quetiapine, Vancomycin, Blueberry flavoring agent (non-screening), Cephalexin , Fish allergy, Ibuprofen, Nitrofuran derivatives, Shellfish allergy, Strawberry flavoring agent (non-screening), and Sulfa antibiotics  Review of Systems Review of Systems  All other systems reviewed and are negative.   Physical Exam Vital Signs  I have reviewed the triage vital signs BP 136/61   Pulse 74   Temp 98.1 F (36.7 C) (Oral)   Resp 18   Ht 5' 4 (1.626 m)   Wt 98.4 kg   SpO2 96%   BMI 37.24 kg/m  Physical Exam Vitals and nursing note reviewed.  Constitutional:      General: She is not in acute distress.    Appearance: She is well-developed.  HENT:     Head: Normocephalic and atraumatic.     Mouth/Throat:     Mouth: Mucous membranes are moist.  Eyes:     Pupils: Pupils are equal, round, and reactive to light.  Cardiovascular:     Rate and Rhythm: Normal rate and regular rhythm.     Pulses:          Radial pulses are 2+ on the right side and 2+ on the left side.     Heart sounds: No murmur heard. Pulmonary:     Effort: Pulmonary effort is normal. No respiratory distress.     Breath sounds: Normal breath sounds.  Abdominal:     General: Abdomen is flat.     Palpations: Abdomen is soft.     Tenderness:  There is abdominal tenderness (mild, generalized).  Musculoskeletal:        General: No tenderness.     Right lower leg: No edema.     Left lower leg: No edema.  Skin:    General: Skin is warm and dry.     Comments: Right chest HD catheter in place, clean, dry, and intact.  Neurological:     General: No focal deficit present.     Mental Status: She is alert. Mental status  is at baseline.  Psychiatric:        Mood and Affect: Mood normal.        Behavior: Behavior normal.     ED Results and Treatments Labs (all labs ordered are listed, but only abnormal results are displayed) Labs Reviewed  COMPREHENSIVE METABOLIC PANEL WITH GFR - Abnormal; Notable for the following components:      Result Value   Sodium 133 (*)    Potassium 5.7 (*)    Chloride 96 (*)    CO2 20 (*)    Glucose, Bld 120 (*)    BUN 43 (*)    Creatinine, Ser 9.19 (*)    Total Protein 9.3 (*)    AST 12 (*)    GFR, Estimated 4 (*)    Anion gap 16 (*)    All other components within normal limits  CBC WITH DIFFERENTIAL/PLATELET - Abnormal; Notable for the following components:   RBC 2.36 (*)    Hemoglobin 7.2 (*)    HCT 22.4 (*)    RDW 15.8 (*)    All other components within normal limits  TROPONIN T, HIGH SENSITIVITY - Abnormal; Notable for the following components:   Troponin T High Sensitivity 108 (*)    All other components within normal limits  TROPONIN T, HIGH SENSITIVITY - Abnormal; Notable for the following components:   Troponin T High Sensitivity 106 (*)    All other components within normal limits  LIPASE, BLOOD                                                                                                                          Radiology CT ABDOMEN PELVIS WO CONTRAST Result Date: 08/30/2024 EXAM: CT ABDOMEN AND PELVIS WITHOUT CONTRAST 08/30/2024 08:42:14 PM TECHNIQUE: CT of the abdomen and pelvis was performed without the administration of intravenous contrast. Multiplanar reformatted images  are provided for review. Automated exposure control, iterative reconstruction, and/or weight-based adjustment of the mA/kV was utilized to reduce the radiation dose to as low as reasonably achievable. COMPARISON: 06/26/2024 CLINICAL HISTORY: Abdominal pain, acute, nonlocalized. FINDINGS: LOWER CHEST: Patchy left lower lobe opacity, suspicious for pneumonia. Moderate 3-vessel coronary atherosclerosis. LIVER: The liver is unremarkable. GALLBLADDER AND BILE DUCTS: Status post cholecystectomy. No biliary ductal dilatation. SPLEEN: No acute abnormality. PANCREAS: No acute abnormality. ADRENAL GLANDS: No acute abnormality. KIDNEYS, URETERS AND BLADDER: Punctate nonobstructing right lower pole renal calculus. No stones in the ureters. No hydronephrosis. No perinephric or periureteral stranding. Urinary bladder is unremarkable. GI AND BOWEL: Stomach demonstrates no acute abnormality. Status post appendectomy. There is no bowel obstruction. PERITONEUM AND RETROPERITONEUM: Mesenteric stranding along the anterior mid abdomen (image 52), progressive from recent priors, new from 2024. This appearance favors sclerosing mesenteritis. No ascites. No free air. VASCULATURE: Atherosclerotic calcifications of the abdominal aorta and branch vessels. LYMPH NODES: No discrete lymphadenopathy. REPRODUCTIVE ORGANS: Status post hysterectomy. BONES AND SOFT TISSUES: No acute osseous abnormality. No focal soft tissue abnormality. IMPRESSION: 1. Progressive mesenteric  stranding along the anterior mid abdomen, favoring sclerosing mesenteritis. 2. Otherwise, no acute findings in the abdomen/pelvis. 3. Patchy left lower lobe opacity, suspicious for pneumonia. Electronically signed by: Pinkie Pebbles MD 08/30/2024 09:07 PM EST RP Workstation: [id number]   DG Chest Portable 1 View Result Date: 08/30/2024 EXAM: 1 VIEW(S) XRAY OF THE CHEST 08/30/2024 07:29:00 PM COMPARISON: 07/01/2024 CLINICAL HISTORY: chest pain FINDINGS: LINES, TUBES AND  DEVICES: Stable right chest dialysis catheter with distal tip ending at the cavoatrial junction. LUNGS AND PLEURA: Stable chronically low lung volumes. No focal pulmonary opacity. No pleural effusion. No pneumothorax. HEART AND MEDIASTINUM: The distal tip of the right chest dialysis catheter is at the cavoatrial junction. No acute abnormality of the cardiac and mediastinal silhouettes. BONES AND SOFT TISSUES: No acute osseous abnormality. IMPRESSION: 1. No acute cardiopulmonary findings. 2. Stable right chest dialysis catheter with tip at the cavoatrial junction. Electronically signed by: Greig Pique MD 08/30/2024 07:38 PM EST RP Workstation: [id number]    Pertinent labs & imaging results that were available during my care of the patient were reviewed by me and considered in my medical decision making (see MDM for details).  Medications Ordered in ED Medications  ondansetron  (ZOFRAN ) injection 4 mg (4 mg Intravenous Given 08/30/24 1952)  HYDROmorphone  (DILAUDID ) injection 0.5 mg (0.5 mg Intravenous Given 08/30/24 1953)  ondansetron  (ZOFRAN ) injection 4 mg (4 mg Intravenous Given 08/30/24 2203)  HYDROcodone -acetaminophen  (NORCO/VICODIN) 5-325 MG per tablet 1 tablet (1 tablet Oral Given 08/30/24 2209)  sodium zirconium cyclosilicate  (LOKELMA ) packet 10 g (10 g Oral Given 08/30/24 2235)                                                                                                                                     Procedures Procedures  (including critical care time)  Medical Decision Making / ED Course   MDM:  63 year old presenting to the emergency department with pain from dialysis catheter, also reports some vague abdominal pain and intermittent chest pain.  Patient overall well-appearing, examination overall unremarkable other than mild diffuse abdominal tenderness.  Examination without focal abnormality.  HD catheter appears to be in the right place.  Unclear cause of pain, consider  process such as thrombus but patient is on chronic anticoagulation with Eliquis .  Appears to be appropriately positioned and has been functioning normally.  No signs of surrounding infection or other abnormality.  Overall low concern for acute dangerous process.  She also reports some vague chest pain, symptoms seem atypical for ACS.  Had recent admission for chest pain in October, patient was felt to be poor candidate for catheterization.  Will check troponin x 2.  Doubt other process such as PE, patient is on chronic anticoagulation.  Will check chest x-ray.  But low concern for other process such as pneumonia or pneumothorax.  With abdominal pain and mild tenderness, also obtain CT scan.  Patient reports she still produces urine so  we will avoid contrast.  She reports some nausea without vomiting.  Differential includes pancreatitis, gastritis, colitis, obstruction, perforation, diverticulitis.  Patient's symptoms overall seem very mild, if workup is reassuring anticipate likely discharge to encouragement to continue with her outpatient dialysis schedule.    Clinical Course as of 08/31/24 0001  Mon Aug 30, 2024  2359 Labs notable for worsening anemia.  Rectal exam performed with chaperone, negative for any occult blood, melena, hematochezia.  She did have recent endoscopy which is reassuring.  Suspect likely due to underlying end-stage renal disease.  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  Suspect this is more of a chronic problem.  Patient could follow-up outpatient for this.  Troponin was also noted to be very elevated up to 100 although stable.  This is worse than recent troponin level.  Suspect most likely not related to ACS but would at least observe patient overnight.  Discussed with hospitalist who will admit patient.  Discussed with nephrologist to arrange this. [WS]    Clinical Course User Index [WS] Francesca Elsie CROME,  MD     Additional history obtained: -Additional history obtained from family -External records from outside source obtained and reviewed including: Chart review including previous notes, labs, imaging, consultation notes including prior notes    Lab Tests: -I ordered, reviewed, and interpreted labs.   The pertinent results include:   Labs Reviewed  COMPREHENSIVE METABOLIC PANEL WITH GFR - Abnormal; Notable for the following components:      Result Value   Sodium 133 (*)    Potassium 5.7 (*)    Chloride 96 (*)    CO2 20 (*)    Glucose, Bld 120 (*)    BUN 43 (*)    Creatinine, Ser 9.19 (*)    Total Protein 9.3 (*)    AST 12 (*)    GFR, Estimated 4 (*)    Anion gap 16 (*)    All other components within normal limits  CBC WITH DIFFERENTIAL/PLATELET - Abnormal; Notable for the following components:   RBC 2.36 (*)    Hemoglobin 7.2 (*)    HCT 22.4 (*)    RDW 15.8 (*)    All other components within normal limits  TROPONIN T, HIGH SENSITIVITY - Abnormal; Notable for the following components:   Troponin T High Sensitivity 108 (*)    All other components within normal limits  TROPONIN T, HIGH SENSITIVITY - Abnormal; Notable for the following components:   Troponin T High Sensitivity 106 (*)    All other components within normal limits  LIPASE, BLOOD    Notable for mild hyperglycemia, elevated troponin  EKG   EKG Interpretation Date/Time:  Monday August 30 2024 19:32:18 EST Ventricular Rate:  78 PR Interval:  155 QRS Duration:  94 QT Interval:  395 QTC Calculation: 450 R Axis:   47  Text Interpretation: Sinus rhythm Confirmed by Francesca Elsie ([id number]) on 08/30/2024 8:52:57 PM         Imaging Studies ordered: I ordered imaging studies including CT abdomen On my interpretation imaging demonstrates no acute process I independently visualized and interpreted imaging. I agree with the radiologist interpretation   Medicines ordered and prescription drug  management: Meds ordered this encounter  Medications   ondansetron  (ZOFRAN ) injection 4 mg   HYDROmorphone  (DILAUDID ) injection 0.5 mg   ondansetron  (ZOFRAN ) injection 4 mg   HYDROcodone -acetaminophen  (NORCO/VICODIN) 5-325 MG per tablet 1 tablet    Refill:  0  sodium zirconium cyclosilicate  (LOKELMA ) packet 10 g    -I have reviewed the patients home medicines and have made adjustments as needed  Social Determinants of Health:  Diagnosis or treatment significantly limited by social determinants of health: obesity   Reevaluation: After the interventions noted above, I reevaluated the patient and found that their symptoms have improved  Co morbidities that complicate the patient evaluation  Past Medical History:  Diagnosis Date   Anxiety    Asthma    Atrial fibrillation (HCC)    Bipolar 1 disorder (HCC)    Chronic back pain    Chronic chest pain    Diabetes mellitus without complication [facility])    Dialysis patient    Earlean Everts, Saturday   DVT (deep venous thrombosis) (HCC) 2017   Gastroparesis    GERD (gastroesophageal reflux disease)    GI bleed 11/2023   required 2 units PRBC transfusion   Hyperlipemia    Hypertension    IBS (irritable bowel syndrome)    Normal cardiac stress test 02/14/2014   [facility]   Panic attacks       Dispostion: Disposition decision including need for hospitalization was considered, and patient admitted to the hospital.    Final Clinical Impression(s) / ED Diagnoses Final diagnoses:  Hyperkalemia  Elevated troponin     This chart was dictated using voice recognition software.  Despite best efforts to proofread,  errors can occur which can change the documentation meaning.     [1]  Social History Tobacco Use   Smoking status: Passive Smoke Exposure - Never Smoker   Smokeless tobacco: Never  Vaping Use   Vaping status: Never Used  Substance Use Topics   Alcohol use: No   Drug use: No     Francesca Elsie CROME,  MD 08/31/24 0001

## 2024-08-30 NOTE — ED Notes (Signed)
 Patient transported to CT

## 2024-08-31 ENCOUNTER — Other Ambulatory Visit: Payer: Self-pay

## 2024-08-31 ENCOUNTER — Encounter (HOSPITAL_COMMUNITY): Payer: Self-pay | Admitting: Internal Medicine

## 2024-08-31 DIAGNOSIS — Z8616 Personal history of COVID-19: Secondary | ICD-10-CM | POA: Diagnosis not present

## 2024-08-31 DIAGNOSIS — Z7901 Long term (current) use of anticoagulants: Secondary | ICD-10-CM | POA: Diagnosis not present

## 2024-08-31 DIAGNOSIS — E1122 Type 2 diabetes mellitus with diabetic chronic kidney disease: Secondary | ICD-10-CM | POA: Diagnosis present

## 2024-08-31 DIAGNOSIS — K654 Sclerosing mesenteritis: Secondary | ICD-10-CM | POA: Diagnosis present

## 2024-08-31 DIAGNOSIS — N186 End stage renal disease: Secondary | ICD-10-CM | POA: Diagnosis present

## 2024-08-31 DIAGNOSIS — E875 Hyperkalemia: Secondary | ICD-10-CM | POA: Diagnosis present

## 2024-08-31 DIAGNOSIS — D509 Iron deficiency anemia, unspecified: Secondary | ICD-10-CM | POA: Diagnosis present

## 2024-08-31 DIAGNOSIS — E785 Hyperlipidemia, unspecified: Secondary | ICD-10-CM | POA: Diagnosis present

## 2024-08-31 DIAGNOSIS — M25532 Pain in left wrist: Secondary | ICD-10-CM | POA: Diagnosis not present

## 2024-08-31 DIAGNOSIS — E1143 Type 2 diabetes mellitus with diabetic autonomic (poly)neuropathy: Secondary | ICD-10-CM | POA: Diagnosis present

## 2024-08-31 DIAGNOSIS — R935 Abnormal findings on diagnostic imaging of other abdominal regions, including retroperitoneum: Secondary | ICD-10-CM | POA: Diagnosis not present

## 2024-08-31 DIAGNOSIS — K766 Portal hypertension: Secondary | ICD-10-CM | POA: Diagnosis present

## 2024-08-31 DIAGNOSIS — J129 Viral pneumonia, unspecified: Secondary | ICD-10-CM | POA: Diagnosis present

## 2024-08-31 DIAGNOSIS — F319 Bipolar disorder, unspecified: Secondary | ICD-10-CM | POA: Diagnosis present

## 2024-08-31 DIAGNOSIS — I480 Paroxysmal atrial fibrillation: Secondary | ICD-10-CM | POA: Diagnosis present

## 2024-08-31 DIAGNOSIS — I5032 Chronic diastolic (congestive) heart failure: Secondary | ICD-10-CM | POA: Diagnosis present

## 2024-08-31 DIAGNOSIS — N2581 Secondary hyperparathyroidism of renal origin: Secondary | ICD-10-CM | POA: Diagnosis present

## 2024-08-31 DIAGNOSIS — D62 Acute posthemorrhagic anemia: Secondary | ICD-10-CM | POA: Diagnosis not present

## 2024-08-31 DIAGNOSIS — R1032 Left lower quadrant pain: Secondary | ICD-10-CM | POA: Diagnosis not present

## 2024-08-31 DIAGNOSIS — K5900 Constipation, unspecified: Secondary | ICD-10-CM | POA: Diagnosis not present

## 2024-08-31 DIAGNOSIS — D649 Anemia, unspecified: Secondary | ICD-10-CM | POA: Diagnosis not present

## 2024-08-31 DIAGNOSIS — K3184 Gastroparesis: Secondary | ICD-10-CM | POA: Diagnosis present

## 2024-08-31 DIAGNOSIS — Z794 Long term (current) use of insulin: Secondary | ICD-10-CM | POA: Diagnosis not present

## 2024-08-31 DIAGNOSIS — Z7902 Long term (current) use of antithrombotics/antiplatelets: Secondary | ICD-10-CM | POA: Diagnosis not present

## 2024-08-31 DIAGNOSIS — I2510 Atherosclerotic heart disease of native coronary artery without angina pectoris: Secondary | ICD-10-CM | POA: Diagnosis present

## 2024-08-31 DIAGNOSIS — D631 Anemia in chronic kidney disease: Secondary | ICD-10-CM | POA: Diagnosis present

## 2024-08-31 DIAGNOSIS — E66812 Obesity, class 2: Secondary | ICD-10-CM | POA: Diagnosis present

## 2024-08-31 DIAGNOSIS — I132 Hypertensive heart and chronic kidney disease with heart failure and with stage 5 chronic kidney disease, or end stage renal disease: Secondary | ICD-10-CM | POA: Diagnosis present

## 2024-08-31 DIAGNOSIS — Z992 Dependence on renal dialysis: Secondary | ICD-10-CM | POA: Diagnosis not present

## 2024-08-31 LAB — PROCALCITONIN: Procalcitonin: 0.26 ng/mL

## 2024-08-31 LAB — HEMOGLOBIN AND HEMATOCRIT, BLOOD
HCT: 20.1 % — ABNORMAL LOW (ref 36.0–46.0)
Hemoglobin: 6.4 g/dL — CL (ref 12.0–15.0)

## 2024-08-31 LAB — COMPREHENSIVE METABOLIC PANEL WITH GFR
ALT: 6 U/L (ref 0–44)
AST: <10 U/L — ABNORMAL LOW (ref 15–41)
Albumin: 3.3 g/dL — ABNORMAL LOW (ref 3.5–5.0)
Alkaline Phosphatase: 62 U/L (ref 38–126)
Anion gap: 10 (ref 5–15)
BUN: 43 mg/dL — ABNORMAL HIGH (ref 8–23)
CO2: 28 mmol/L (ref 22–32)
Calcium: 8.7 mg/dL — ABNORMAL LOW (ref 8.9–10.3)
Chloride: 97 mmol/L — ABNORMAL LOW (ref 98–111)
Creatinine, Ser: 9.97 mg/dL — ABNORMAL HIGH (ref 0.44–1.00)
GFR, Estimated: 4 mL/min — ABNORMAL LOW (ref >60)
Glucose, Bld: 81 mg/dL (ref 70–99)
Potassium: 5.6 mmol/L — ABNORMAL HIGH (ref 3.5–5.1)
Sodium: 135 mmol/L (ref 135–145)
Total Bilirubin: 0.5 mg/dL (ref 0.0–1.2)
Total Protein: 8.5 g/dL — ABNORMAL HIGH (ref 6.5–8.1)

## 2024-08-31 LAB — CBC
HCT: 19.3 % — ABNORMAL LOW (ref 36.0–46.0)
Hemoglobin: 6.2 g/dL — CL (ref 12.0–15.0)
MCH: 30.5 pg (ref 26.0–34.0)
MCHC: 32.1 g/dL (ref 30.0–36.0)
MCV: 95.1 fL (ref 80.0–100.0)
Platelets: 192 K/uL (ref 150–400)
RBC: 2.03 MIL/uL — ABNORMAL LOW (ref 3.87–5.11)
RDW: 15.8 % — ABNORMAL HIGH (ref 11.5–15.5)
WBC: 5.9 K/uL (ref 4.0–10.5)
nRBC: 0 % (ref 0.0–0.2)

## 2024-08-31 LAB — MAGNESIUM: Magnesium: 1.9 mg/dL (ref 1.7–2.4)

## 2024-08-31 LAB — GLUCOSE, CAPILLARY
Glucose-Capillary: 86 mg/dL (ref 70–99)
Glucose-Capillary: 116 mg/dL — ABNORMAL HIGH (ref 70–99)
Glucose-Capillary: 123 mg/dL — ABNORMAL HIGH (ref 70–99)
Glucose-Capillary: 172 mg/dL — ABNORMAL HIGH (ref 70–99)
Glucose-Capillary: 163 mg/dL — ABNORMAL HIGH (ref 70–99)

## 2024-08-31 LAB — MRSA NEXT GEN BY PCR, NASAL: MRSA by PCR Next Gen: NOT DETECTED

## 2024-08-31 LAB — PREPARE RBC (CROSSMATCH)

## 2024-08-31 LAB — PHOSPHORUS: Phosphorus: 5.5 mg/dL — ABNORMAL HIGH (ref 2.5–4.6)

## 2024-08-31 MED ORDER — ONDANSETRON HCL 4 MG PO TABS
4.0000 mg | ORAL_TABLET | Freq: Four times a day (QID) | ORAL | Status: DC | PRN
  Administered 2024-09-02 (×2): 4 mg via ORAL
  Filled 2024-08-31 (×2): qty 1

## 2024-08-31 MED ORDER — LIDOCAINE HCL (PF) 1 % IJ SOLN: 5.0000 mL | INTRAMUSCULAR | Status: DC | PRN

## 2024-08-31 MED ORDER — ONDANSETRON HCL 4 MG/2ML IJ SOLN
INTRAMUSCULAR | Status: AC
  Filled 2024-08-31: qty 2

## 2024-08-31 MED ORDER — SERTRALINE HCL 50 MG PO TABS
100.0000 mg | ORAL_TABLET | Freq: Every day | ORAL | Status: DC
  Administered 2024-08-31 – 2024-09-03 (×4): 100 mg via ORAL
  Filled 2024-08-31 (×4): qty 2

## 2024-08-31 MED ORDER — ANTICOAGULANT SODIUM CITRATE 4% (200MG/5ML) IV SOLN: 5.0000 mL | Status: DC | PRN

## 2024-08-31 MED ORDER — ALTEPLASE 2 MG IJ SOLR
2.0000 mg | Freq: Once | INTRAMUSCULAR | Status: AC | PRN
  Administered 2024-08-31: 14:00:00 2 mg

## 2024-08-31 MED ORDER — LIDOCAINE-PRILOCAINE 2.5-2.5 % EX CREA: 1.0000 | TOPICAL_CREAM | CUTANEOUS | Status: DC | PRN

## 2024-08-31 MED ORDER — DIPHENHYDRAMINE HCL 25 MG PO CAPS
ORAL_CAPSULE | ORAL | Status: AC
  Filled 2024-08-31: qty 1

## 2024-08-31 MED ORDER — ACETAMINOPHEN 650 MG RE SUPP: 650.0000 mg | Freq: Four times a day (QID) | RECTAL | Status: DC | PRN

## 2024-08-31 MED ORDER — ONDANSETRON HCL 4 MG/2ML IJ SOLN
4.0000 mg | Freq: Four times a day (QID) | INTRAMUSCULAR | Status: DC | PRN
  Administered 2024-08-31 – 2024-09-03 (×3): 4 mg via INTRAVENOUS
  Filled 2024-08-31: qty 2

## 2024-08-31 MED ORDER — CARVEDILOL 12.5 MG PO TABS
12.5000 mg | ORAL_TABLET | Freq: Two times a day (BID) | ORAL | Status: DC
  Administered 2024-09-01 – 2024-09-02 (×3): 12.5 mg via ORAL
  Filled 2024-08-31 (×6): qty 1

## 2024-08-31 MED ORDER — HEPARIN SODIUM (PORCINE) 1000 UNIT/ML IJ SOLN
INTRAMUSCULAR | Status: AC
  Filled 2024-08-31: qty 4

## 2024-08-31 MED ORDER — CLOPIDOGREL BISULFATE 75 MG PO TABS
75.0000 mg | ORAL_TABLET | Freq: Every day | ORAL | Status: DC
  Administered 2024-08-31 – 2024-09-03 (×4): 75 mg via ORAL
  Filled 2024-08-31 (×4): qty 1

## 2024-08-31 MED ORDER — ALTEPLASE 2 MG IJ SOLR
INTRAMUSCULAR | Status: AC
  Filled 2024-08-31: qty 2

## 2024-08-31 MED ORDER — SODIUM CHLORIDE 0.9% IV SOLUTION: Freq: Once | INTRAVENOUS | Status: AC

## 2024-08-31 MED ORDER — ACETAMINOPHEN 325 MG PO TABS
650.0000 mg | ORAL_TABLET | Freq: Four times a day (QID) | ORAL | Status: DC | PRN
  Administered 2024-08-31 – 2024-09-02 (×4): 650 mg via ORAL
  Filled 2024-08-31 (×4): qty 2

## 2024-08-31 MED ORDER — PENTAFLUOROPROP-TETRAFLUOROETH EX AERO: 1.0000 | INHALATION_SPRAY | CUTANEOUS | Status: DC | PRN

## 2024-08-31 MED ORDER — CLONAZEPAM 0.5 MG PO TABS
0.5000 mg | ORAL_TABLET | Freq: Two times a day (BID) | ORAL | Status: DC | PRN
  Administered 2024-09-02: 21:00:00 0.5 mg via ORAL
  Filled 2024-08-31: qty 1

## 2024-08-31 MED ORDER — DIPHENHYDRAMINE HCL 25 MG PO CAPS
25.0000 mg | ORAL_CAPSULE | Freq: Once | ORAL | Status: AC
  Administered 2024-08-31: 13:00:00 25 mg via ORAL

## 2024-08-31 MED ORDER — INSULIN ASPART 100 UNIT/ML IJ SOLN
0.0000 [IU] | Freq: Three times a day (TID) | INTRAMUSCULAR | Status: DC
  Administered 2024-08-31: 19:00:00 1 [IU] via SUBCUTANEOUS

## 2024-08-31 MED ORDER — ATORVASTATIN CALCIUM 40 MG PO TABS
40.0000 mg | ORAL_TABLET | Freq: Every morning | ORAL | Status: DC
  Administered 2024-08-31 – 2024-09-03 (×4): 40 mg via ORAL
  Filled 2024-08-31 (×4): qty 1

## 2024-08-31 MED ORDER — PANTOPRAZOLE SODIUM 40 MG PO TBEC
40.0000 mg | DELAYED_RELEASE_TABLET | Freq: Two times a day (BID) | ORAL | Status: DC
  Administered 2024-08-31 – 2024-09-03 (×6): 40 mg via ORAL
  Filled 2024-08-31 (×7): qty 1

## 2024-08-31 MED ORDER — OXYCODONE HCL 5 MG PO TABS
5.0000 mg | ORAL_TABLET | Freq: Four times a day (QID) | ORAL | Status: DC | PRN
  Administered 2024-08-31 – 2024-09-02 (×6): 5 mg via ORAL
  Filled 2024-08-31 (×7): qty 1

## 2024-08-31 MED ORDER — LAMOTRIGINE 100 MG PO TABS
100.0000 mg | ORAL_TABLET | Freq: Two times a day (BID) | ORAL | Status: DC
  Administered 2024-08-31 – 2024-09-03 (×7): 100 mg via ORAL
  Filled 2024-08-31 (×7): qty 1

## 2024-08-31 MED ORDER — CHLORHEXIDINE GLUCONATE CLOTH 2 % EX PADS
6.0000 | MEDICATED_PAD | Freq: Every day | CUTANEOUS | Status: DC
  Administered 2024-09-01 – 2024-09-03 (×3): 6 via TOPICAL

## 2024-08-31 NOTE — TOC Initial Note (Signed)
 Transition of Care [facility]) - Initial/Assessment Note    Patient Details  Name: [name] MRN: [id number] Date of Birth: [birth year]  Transition of Care [facility]) CM/SW Contact:    Lucie Lunger, LCSWA Phone Number: 08/31/2024, 3:46 PM  Clinical Narrative:                 Pt is high risk for readmission. Pt is known to [facility] from past hospital admissions. Pt is from home with family. Pt is independent in completing ADLs and has transportation when needed. Pt was provided with [facility] transportation info at most recent admission. Pt has not had HH. Pt has a rollator to use when needed. TOC to follow.   Expected Discharge Plan: Home/Self Care Barriers to Discharge: Facility will not accept until restraint criteria met   Patient Goals and CMS Choice Patient states their goals for this hospitalization and ongoing recovery are:: return home CMS Medicare.gov Compare Post Acute Care list provided to:: Patient Choice offered to / list presented to : Patient      Expected Discharge Plan and Services In-house Referral: Clinical Social Work Discharge Planning Services: CM Consult   Living arrangements for the past 2 months: Single Family Home                                      Prior Living Arrangements/Services Living arrangements for the past 2 months: Single Family Home Lives with:: Relatives Patient language and need for interpreter reviewed:: Yes Do you feel safe going back to the place where you live?: Yes      Need for Family Participation in Patient Care: Yes (Comment) Care giver support system in place?: Yes (comment) Current home services: DME Criminal Activity/Legal Involvement Pertinent to Current Situation/Hospitalization: No - Comment as needed  Activities of Daily Living   ADL Screening (condition at time of admission) Independently performs ADLs?: No Does the patient have a NEW difficulty with bathing/dressing/toileting/self-feeding that is expected to last  >3 days?: No Does the patient have a NEW difficulty with getting in/out of bed, walking, or climbing stairs that is expected to last >3 days?: No Does the patient have a NEW difficulty with communication that is expected to last >3 days?: No Is the patient deaf or have difficulty hearing?: No Does the patient have difficulty seeing, even when wearing glasses/contacts?: No Does the patient have difficulty concentrating, remembering, or making decisions?: No  Permission Sought/Granted                  Emotional Assessment Appearance:: Appears stated age Attitude/Demeanor/Rapport: Engaged Affect (typically observed): Accepting Orientation: : Oriented to Self, Oriented to Place, Oriented to  Time, Oriented to Situation Alcohol / Substance Use: Not Applicable Psych Involvement: No (comment)  Admission diagnosis:  Hyperkalemia [E87.5] Elevated troponin [R79.89] Patient Active Problem List   Diagnosis Date Noted   Hyperkalemia 08/30/2024   ESRD on dialysis (HCC) 07/04/2024   Acute on chronic anemia 06/28/2024   Chronic anticoagulation 06/28/2024   Enteritis 06/28/2024   Near syncope 05/06/2024   CKD (chronic kidney disease) stage 4, GFR 15-29 ml/min (HCC) 05/06/2024   PAF (paroxysmal atrial fibrillation) (HCC) 05/06/2024   Chest pain 05/05/2024   Chronic heart failure with preserved ejection fraction (HFpEF) (HCC) 06/08/2023   Paroxysmal atrial fibrillation (HCC) 06/08/2023   Hypocalcemia 06/07/2023   Acute cystitis 06/07/2023   Anemia 06/06/2023   Acute kidney injury superimposed  on stage 5 chronic kidney disease, not on chronic dialysis (HCC) 06/05/2023   Hypokalemia 06/05/2023   COVID-19 virus infection 06/05/2023   Thrombocytopenia 06/05/2023   Hypoalbuminemia due to protein-calorie malnutrition 06/05/2023   Atrial fibrillation, chronic (HCC) 06/05/2023   Orthostatic hypotension 05/02/2023   Hyponatremia 05/02/2023   Chronic diastolic heart failure (HCC) 04/23/2023    Unstable angina (HCC) 04/22/2023   Stage 3b chronic kidney disease (HCC) 04/22/2023   Type 2 diabetes mellitus with complication, with long-term current use of insulin  (HCC) 04/22/2023   Acute on chronic heart failure with preserved ejection fraction (HCC) 04/22/2023   Iron deficiency anemia 04/22/2023   RLQ abdominal pain 04/18/2023   Chest pain with high risk for cardiac etiology 04/17/2023   Atypical chest pain 04/16/2023   Acute kidney injury superimposed on chronic kidney disease 04/16/2023   Type 2 diabetes mellitus with hyperglycemia (HCC) 04/16/2023   Persistent atrial fibrillation (HCC) 04/16/2023   Obesity, Class III, BMI 40-49.9 (morbid obesity) (HCC) 04/16/2023   Erroneous encounter - disregard 02/14/2023   Left-sided weakness 02/08/2023   BRBPR (bright red blood per rectum) 04/07/2012   GASTRIC POLYP 10/21/2006   Mixed hyperlipidemia 10/21/2006   DISORDER, BIPOLAR NOS 10/21/2006   DEPRESSION 10/21/2006   Migraine headache 10/21/2006   Essential hypertension 10/21/2006   Coronary artery disease 10/21/2006   GERD 10/21/2006   LOW BACK PAIN 10/21/2006   PCP:  System, Provider Not In Pharmacy:   [id number] [place], [place] - [address] AT [place] [address] [place] [address] Phone: [phone] Fax: [phone]     Social Drivers of Health (SDOH) Social History: SDOH Screenings   Food Insecurity: No Food Insecurity (08/31/2024)  Housing: Low Risk (08/31/2024)  Transportation Needs: No Transportation Needs (08/31/2024)  Utilities: Not At Risk (08/31/2024)  Social Connections: Moderately Integrated (08/31/2024)  Tobacco Use: Medium Risk (08/31/2024)   SDOH Interventions:     Readmission Risk Interventions    08/31/2024    3:45 PM 06/26/2024   11:49 AM 05/07/2024   10:40 AM  Readmission Risk Prevention Plan  Transportation Screening Complete Complete Complete  PCP or Specialist Appt within 3-5 Days  Not Complete    HRI or Home Care Consult  Complete Complete  Social Work Consult for Recovery Care Planning/Counseling  Complete Complete  Palliative Care Screening  Not Applicable Not Applicable  Medication Review [employer]) Complete Complete Complete  HRI or Home Care Consult Complete    SW Recovery Care/Counseling Consult Complete    Palliative Care Screening Not Applicable    Skilled Nursing Facility Not Applicable

## 2024-08-31 NOTE — Discharge Instructions (Signed)

## 2024-08-31 NOTE — Plan of Care (Signed)

## 2024-08-31 NOTE — Progress Notes (Signed)
 Pt requested prn Oxy 5 mg for pain in left hand, chest and upper right leg. Administered, pt tolerated well. Will follow up and continue to monitor. Plan of care ongoing.

## 2024-08-31 NOTE — Consult Note (Signed)
 [facility] Renal Consultation Note    Indication for Consultation:  Management of ESRD/hemodialysis; anemia, hypertension/volume and secondary hyperparathyroidism  HPI: [name] is a 63 y.o. female with a PMH including DM2 with neuropathy, hypertension, CAD with history of PCI, chronic HFpEF, bipolar disorder, atrial fibrillation, history of GI bleeding, chronic recurrent chest pain, and recent ESRD (since October 2025 following AKI/CKD) who presented to [facility] ED on 08/30/24 complaining of malaise, fatigue, weakness, and pain at the sight of her RIJ TDC.  She felt too weak to go to HD on Saturday.  In the ED, Temp 97.9, Bp 152/81, HR 78, SpO2 99%.  Labs notable for WBC 6.6, Hgb 7.2, Na 133, K 5.7, Cl 96, Co2 20, BUN 43, Cr 9.19, Ca 8.9, alb 3.5.  CXR without acute cardiopulmonary findings.  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  She was admitted for further evaluation and we were consulted to provide dialysis during her admission.    Past Medical History:  Diagnosis Date   Anxiety    Asthma    Atrial fibrillation (HCC)    Bipolar 1 disorder (HCC)    Chronic back pain    Chronic chest pain    Diabetes mellitus without complication [facility])    Dialysis patient    Earlean Everts, Saturday   DVT (deep venous thrombosis) (HCC) 2017   Gastroparesis    GERD (gastroesophageal reflux disease)    GI bleed 11/2023   required 2 units PRBC transfusion   Hyperlipemia    Hypertension    IBS (irritable bowel syndrome)    Normal cardiac stress test 02/14/2014   [facility]   Panic attacks    Past Surgical History:  Procedure Laterality Date   ABDOMINAL HYSTERECTOMY  2001   APPENDECTOMY  10/2005   CHOLECYSTECTOMY  10/2005   COLONOSCOPY  2007   Patel ([name])-pt reports hemorrhoids   ESOPHAGOGASTRODUODENOSCOPY  02/28/2006   [name], normal on  daily PPI, myultiple hyperplastic polyps   ESOPHAGOGASTRODUODENOSCOPY N/A 06/29/2024   Procedure: EGD (ESOPHAGOGASTRODUODENOSCOPY);  Surgeon: Eartha Flavors, Toribio, MD;  Location: [facility] ENDO SUITE;  Service: Gastroenterology;  Laterality: N/A;   ESOPHAGOGASTRODUODENOSCOPY N/A 06/30/2024   Procedure: EGD (ESOPHAGOGASTRODUODENOSCOPY);  Surgeon: Shaaron Lamar HERO, MD;  Location: [facility] ENDO SUITE;  Service: Endoscopy;  Laterality: N/A;   INSERTION OF DIALYSIS CATHETER Right 07/01/2024   Procedure: INSERTION OF DIALYSIS CATHETER, tunneled catheter;  Surgeon: Kallie [name] BROCKS, MD;  Location: [facility] ORS;  Service: General;  Laterality: Right;   MOUTH SURGERY     RIGHT HEART CATH N/A 04/25/2023   Procedure: RIGHT HEART CATH;  Surgeon: Verlin Lonni BIRCH, MD;  Location: [facility] INVASIVE CV LAB;  Service: Cardiovascular;  Laterality: N/A;   RIGHT OOPHORECTOMY  1999   Family History:   Family History  Problem Relation Age of Onset   Cirrhosis Mother 23       ?etiology   Cirrhosis Father        ?etiology   Cirrhosis Sister 95       ?etiology   Diverticulitis Sister    Social History:  reports that she is a non-smoker but has been exposed to tobacco smoke. She has never used smokeless tobacco. She reports that she does not drink alcohol and does not use drugs. Allergies[1] Prior to Admission medications  Medication Sig Start Date End Date Taking? Authorizing Provider  amLODipine  (NORVASC ) 10 MG tablet Take 10  mg by mouth daily. 12/17/23  Yes [provider]  albuterol  (VENTOLIN  HFA) 108 (90 Base) MCG/ACT inhaler Inhale 2 puffs into the lungs every 6 (six) hours as needed for wheezing or shortness of breath.    [provider]  apixaban  (ELIQUIS ) 5 MG TABS tablet Take 1 tablet (5 mg total) by mouth 2 (two) times daily. Continue hold medication until follow up/clerance by gastroenterology service. 07/04/24   Ricky Fines, MD  atorvastatin  (LIPITOR) 40 MG tablet Take 40 mg by mouth every  morning.    [provider]  carvedilol  (COREG ) 12.5 MG tablet Take 1 tablet (12.5 mg total) by mouth 2 (two) times daily with a meal. 07/04/24   Ricky Fines, MD  carvedilol  (COREG ) 25 MG tablet Take 25 mg by mouth 2 (two) times daily with a meal.    [provider]  clonazePAM  (KLONOPIN ) 0.5 MG tablet Take 1 tablet (0.5 mg total) by mouth 2 (two) times daily as needed for anxiety. 05/18/24   Haze Lonni PARAS, MD  clopidogrel  (PLAVIX ) 75 MG tablet Take 75 mg by mouth daily.    [provider]  ergocalciferol (VITAMIN D2) 1.25 MG (50000 UT) capsule Take 50,000 Units by mouth every 30 (thirty) days. 06/01/24 06/01/25  [provider]  famotidine  (PEPCID ) 20 MG tablet Take 20 mg by mouth 2 (two) times daily.    [provider]  gabapentin  (NEURONTIN ) 300 MG capsule Take 300 mg by mouth 2 (two) times daily.    [provider]  HYDROcodone -acetaminophen  (NORCO/VICODIN) 5-325 MG tablet Take 1 tablet by mouth every 6 (six) hours as needed for moderate pain (pain score 4-6).    [provider]  hydrOXYzine  (ATARAX ) 25 MG tablet Take 25 mg by mouth every 12 (twelve) hours as needed for anxiety.    [provider]  isosorbide  mononitrate (IMDUR ) 30 MG 24 hr tablet Take 3 tablets (90 mg total) by mouth every morning. 07/05/24   Ricky Fines, MD  lamoTRIgine  (LAMICTAL ) 100 MG tablet Take 100 mg by mouth 2 (two) times daily.    [provider]  LANTUS  SOLOSTAR 100 UNIT/ML Solostar Pen Inject 50 Units into the skin 2 (two) times daily.    [provider]  midodrine (PROAMATINE) 5 MG tablet Take 5 mg by mouth 3 (three) times daily with meals.    [provider]  multivitamin (RENA-VIT) TABS tablet Take 1 tablet by mouth at bedtime. 07/04/24   Ricky Fines, MD  NOVOLOG  FLEXPEN 100 UNIT/ML FlexPen Inject 15 Units into the skin 4 (four) times daily. 4-5 times daily 05/13/15   [provider]  ondansetron   (ZOFRAN ) 4 MG tablet Take 1 tablet (4 mg total) by mouth every 6 (six) hours as needed for nausea or vomiting. 05/28/24   Jerral Meth, MD  ondansetron  (ZOFRAN -ODT) 4 MG disintegrating tablet Take 2 mg by mouth every 6 (six) hours as needed. 06/20/24   [provider]  pantoprazole  (PROTONIX ) 40 MG tablet Take 40 mg by mouth 2 (two) times daily before a meal.    [provider]  promethazine  (PHENERGAN ) 25 MG tablet Take 25 mg by mouth every 6 (six) hours as needed for nausea or vomiting.    [provider]  sertraline  (ZOLOFT ) 100 MG tablet Take 100 mg by mouth daily.    [provider]  sevelamer  carbonate (RENVELA ) 800 MG tablet Take 800 mg by mouth 3 (three) times daily with meals.    [provider]  [facility]  160-4.5 MCG/ACT inhaler Inhale 2 puffs into the lungs daily at 6 (six) AM. Patient not taking: Reported on 06/26/2024 02/03/24   [provider]   Current Facility-Administered Medications  Medication Dose Route Frequency Provider Last Rate Last Admin   0.9 %  sodium chloride  infusion (Manually program via Guardrails IV Fluids)   Intravenous Once Maree, Pratik D, DO       acetaminophen  (TYLENOL ) tablet 650 mg  650 mg Oral Q6H PRN Adefeso, Oladapo, DO   650 mg at 08/31/24 9475   Or   acetaminophen  (TYLENOL ) suppository 650 mg  650 mg Rectal Q6H PRN Adefeso, Oladapo, DO       alteplase  (CATHFLO ACTIVASE ) injection 2 mg  2 mg Intracatheter Once PRN Patel, Jay K, MD       anticoagulant sodium citrate  solution 5 mL  5 mL Intracatheter PRN Tobie Gordy POUR, MD       anticoagulant sodium citrate  solution 5 mL  5 mL Intracatheter PRN Jerrye Katheryn BROCKS, MD       atorvastatin  (LIPITOR) tablet 40 mg  40 mg Oral q morning Adefeso, Oladapo, DO   40 mg at 08/31/24 1002   carvedilol  (COREG ) tablet 12.5 mg  12.5 mg Oral BID WC Adefeso, Oladapo, DO       Chlorhexidine  Gluconate Cloth 2 % PADS 6 each  6 each Topical Q0600 Tobie Gordy POUR, MD       clonazePAM   (KLONOPIN ) tablet 0.5 mg  0.5 mg Oral BID PRN Adefeso, Oladapo, DO       clopidogrel  (PLAVIX ) tablet 75 mg  75 mg Oral Daily Adefeso, Oladapo, DO   75 mg at 08/31/24 1002   diphenhydrAMINE  (BENADRYL ) capsule 25 mg  25 mg Oral Once Shah, Pratik D, DO       insulin  aspart (novoLOG ) injection 0-6 Units  0-6 Units Subcutaneous TID WC Adefeso, Oladapo, DO       lamoTRIgine  (LAMICTAL ) tablet 100 mg  100 mg Oral BID Adefeso, Oladapo, DO   100 mg at 08/31/24 1002   lidocaine  (PF) (XYLOCAINE ) 1 % injection 5 mL  5 mL Intradermal PRN Jerrye Katheryn BROCKS, MD       lidocaine -prilocaine  (EMLA ) cream 1 Application  1 Application Topical PRN Jerrye Katheryn BROCKS, MD       ondansetron  (ZOFRAN ) tablet 4 mg  4 mg Oral Q6H PRN Adefeso, Oladapo, DO       Or   ondansetron  (ZOFRAN ) injection 4 mg  4 mg Intravenous Q6H PRN Adefeso, Oladapo, DO       oxyCODONE  (Oxy IR/ROXICODONE ) immediate release tablet 5 mg  5 mg Oral Q6H PRN Maree, Pratik D, DO   5 mg at 08/31/24 1002   pantoprazole  (PROTONIX ) EC tablet 40 mg  40 mg Oral BID AC Adefeso, Oladapo, DO       pentafluoroprop-tetrafluoroeth (GEBAUERS) aerosol 1 Application  1 Application Topical PRN Jerrye Katheryn BROCKS, MD       sertraline  (ZOLOFT ) tablet 100 mg  100 mg Oral Daily Adefeso, Oladapo, DO   100 mg at 08/31/24 1002   Labs: Basic Metabolic Panel: Recent Labs  Lab 08/30/24 2004 08/31/24 0501  NA 133* 135  K 5.7* 5.6*  CL 96* 97*  CO2 20* 28  GLUCOSE 120* 81  BUN 43* 43*  CREATININE 9.19* 9.97*  CALCIUM  8.9 8.7*  PHOS  --  5.5*   Liver Function Tests: Recent Labs  Lab 08/30/24 2004 08/31/24 0501  AST 12* <10*  ALT 6 6  ALKPHOS  70 62  BILITOT 0.6 0.5  PROT 9.3* 8.5*  ALBUMIN  3.5 3.3*   Recent Labs  Lab 08/30/24 2004  LIPASE 45   No results for input(s): AMMONIA in the last 168 hours. CBC: Recent Labs  Lab 08/30/24 2004 08/31/24 0501 08/31/24 0703  WBC 6.6 5.9  --   NEUTROABS 4.8  --   --   HGB 7.2* 6.2* 6.4*  HCT 22.4* 19.3* 20.1*  MCV 94.9  95.1  --   PLT 192 192  --    Cardiac Enzymes: No results for input(s): CKTOTAL, CKMB, CKMBINDEX, TROPONINI in the last 168 hours. CBG: Recent Labs  Lab 08/31/24 0228 08/31/24 0741 08/31/24 1140  GLUCAP 86 116* 123*   Iron Studies: No results for input(s): IRON, TIBC, TRANSFERRIN, FERRITIN in the last 72 hours. Studies/Results: CT ABDOMEN PELVIS WO CONTRAST Result Date: 08/30/2024 EXAM: CT ABDOMEN AND PELVIS WITHOUT CONTRAST 08/30/2024 08:42:14 PM TECHNIQUE: CT of the abdomen and pelvis was performed without the administration of intravenous contrast. Multiplanar reformatted images are provided for review. Automated exposure control, iterative reconstruction, and/or weight-based adjustment of the mA/kV was utilized to reduce the radiation dose to as low as reasonably achievable. COMPARISON: 06/26/2024 CLINICAL HISTORY: Abdominal pain, acute, nonlocalized. FINDINGS: LOWER CHEST: Patchy left lower lobe opacity, suspicious for pneumonia. Moderate 3-vessel coronary atherosclerosis. LIVER: The liver is unremarkable. GALLBLADDER AND BILE DUCTS: Status post cholecystectomy. No biliary ductal dilatation. SPLEEN: No acute abnormality. PANCREAS: No acute abnormality. ADRENAL GLANDS: No acute abnormality. KIDNEYS, URETERS AND BLADDER: Punctate nonobstructing right lower pole renal calculus. No stones in the ureters. No hydronephrosis. No perinephric or periureteral stranding. Urinary bladder is unremarkable. GI AND BOWEL: Stomach demonstrates no acute abnormality. Status post appendectomy. There is no bowel obstruction. PERITONEUM AND RETROPERITONEUM: Mesenteric stranding along the anterior mid abdomen (image 52), progressive from recent priors, new from 2024. This appearance favors sclerosing mesenteritis. No ascites. No free air. VASCULATURE: Atherosclerotic calcifications of the abdominal aorta and branch vessels. LYMPH NODES: No discrete lymphadenopathy. REPRODUCTIVE ORGANS: Status post  hysterectomy. BONES AND SOFT TISSUES: No acute osseous abnormality. No focal soft tissue abnormality. IMPRESSION: 1. 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 2. Otherwise, no acute findings in the abdomen/pelvis. 3. Patchy left lower lobe opacity, suspicious for pneumonia. Electronically signed by: Pinkie Pebbles MD 08/30/2024 09:07 PM EST RP Workstation: [id number]   DG Chest Portable 1 View Result Date: 08/30/2024 EXAM: 1 VIEW(S) XRAY OF THE CHEST 08/30/2024 07:29:00 PM COMPARISON: 07/01/2024 CLINICAL HISTORY: chest pain FINDINGS: LINES, TUBES AND DEVICES: Stable right chest dialysis catheter with distal tip ending at the cavoatrial junction. LUNGS AND PLEURA: Stable chronically low lung volumes. No focal pulmonary opacity. No pleural effusion. No pneumothorax. HEART AND MEDIASTINUM: The distal tip of the right chest dialysis catheter is at the cavoatrial junction. No acute abnormality of the cardiac and mediastinal silhouettes. BONES AND SOFT TISSUES: No acute osseous abnormality. IMPRESSION: 1. No acute cardiopulmonary findings. 2. Stable right chest dialysis catheter with tip at the cavoatrial junction. Electronically signed by: Greig Pique MD 08/30/2024 07:38 PM EST RP Workstation: [id number]    ROS: Pertinent items are noted in HPI. Physical Exam: Vitals:   08/31/24 0900 08/31/24 1000 08/31/24 1135 08/31/24 1140  BP: (!) 129/55 (!) 122/50 131/69 (!) 130/54  Pulse: 78 75 70 70  Resp:  15 18 18   Temp:   98.4 F (36.9 C)   TempSrc:   Oral   SpO2: 95% 95% 100%   Weight:  Height:          Weight change:   Intake/Output Summary (Last 24 hours) at 08/31/2024 1151 Last data filed at 08/31/2024 0800 Gross per 24 hour  Intake 240 ml  Output 450 ml  Net -210 ml   BP (!) 130/54 (BP Location: Left Arm)   Pulse 70   Temp 98.4 F (36.9 C) (Oral)   Resp 18   Ht 5' 3 (1.6 m)   Wt 97.4 kg   SpO2 100%   BMI 38.04 kg/m   General appearance: fatigued, no distress, and pale Head: Normocephalic, without obvious abnormality, atraumatic Eyes: negative findings: lids and lashes normal, conjunctivae and sclerae normal, and corneas clear Resp: clear to auscultation bilaterally Cardio: regular rate and rhythm, S1, S2 normal, no murmur, click, rub or gallop GI: soft, non-tender; bowel sounds normal; no masses,  no organomegaly Extremities: extremities normal, atraumatic, no cyanosis or edema Dialysis Access:  Dialysis Orders: Center:  [facility]  on TTS . EDW 100 kg HD Bath 2K/2.5Ca  Time 4:00 Heparin  none. Access RIJ TDC BFR 350 DFR 600      Assessment/Plan:  Hyperkalemia - due to missed HD.  Improved with Lokelma .  Plan for HD today   Neck pain - has an ulceration and erythema at the cath exit site but no fluctuance or drainage.  Likely due to trauma of RIJ TDC.  No evidence of infection and will clean site with HD today.  ESRD -  continue with HD on TTS schedule  Hypertension/volume  - stable  Acute on chronic anemia  - plan for blood transfusion with HD today.  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  Plan per primary service. CAD - h/o STEMI with PCI on plavix , statin, and coreg .  Fairy RONAL Sellar, MD [facility], [facility] 08/31/2024, 11:51 AM           [1]  Allergies Allergen Reactions   Gelatin Swelling    No jello of any kind   Iodinated Contrast Media Anaphylaxis, Hives, Itching and Swelling   Methylprednisolone  Sodium Succ Shortness Of Breath   Dicyclomine Hcl Anxiety and Other (See Comments)    shaky and sick GI upset, sick to stomach   Doxycycline Hives and Rash    rash   Glutamic Acid Itching and Swelling   Ketorolac Hives and Rash    Tolerated Toradol in the ED without side effect,  hives, or complaint on 02/17/2022   Lisinopril  Swelling    Angioedema (facial, lip, or tongue swelling)   Metformin Nausea And Vomiting and Other (See Comments)    Dyspepsia sick to stomach It made me sick where I couldn't get out of the bed   Nitroglycerin  Nausea And Vomiting and Swelling    Pt states it makes her too sick   Nsaids Other (See Comments)    Stomach pain/bleeding   Other Itching, Swelling and Other (See Comments)    Solu-Medrol  Mix-O-Vial   Quetiapine Other (See Comments)   Vancomycin     Itching and erythema at infusion site within a few minutes of starting infusion. No systemic evidence of Red Man Syndrome   Blueberry Flavoring Agent (Non-Screening) Nausea And Vomiting and Rash   Cephalexin  Nausea And Vomiting and Nausea Only    sick to stomach   Fish Allergy  Rash   Ibuprofen Nausea And Vomiting    GI upset Reports stomach upset 2/2 hx gastric polyps. Not true allergy.   Nitrofuran Derivatives Itching and Nausea And Vomiting    sick to stomach   Shellfish Allergy Itching, Swelling and Rash   Strawberry Flavoring Agent (Non-Screening) Nausea And Vomiting and Rash   Sulfa Antibiotics Nausea And Vomiting and Other (See Comments)    It just makes me real sick

## 2024-08-31 NOTE — Progress Notes (Signed)
 Hgb 6.2 called by lab at 0559. On call provider notified at [phone]

## 2024-08-31 NOTE — Progress Notes (Signed)
 The patient cut off 1.5 hours.d/t tired. AMA form signed and Dr. Rayburn notified. Meds given during HD. Zofran  4 mg IV. Benadryl  25 mg po. TPA for catheter d/t BFR 300-350 ml/min.   08/31/24 1335  Vitals  Temp 98.4 F (36.9 C)  Temp Source Oral  BP (!) 118/52  MAP (mmHg) 71  BP Location Left Arm  BP Method Automatic  Patient Position (if appropriate) Lying  Pulse Rate 74  Resp 20  During Treatment Monitoring  HD Safety Checks Performed Yes  Intra-Hemodialysis Comments See progress note  Post Treatment  Dialyzer Clearance Lightly streaked  Hemodialysis Intake (mL) 0 mL  Liters Processed 45  Fluid Removed (mL) 1000 mL  Tolerated HD Treatment No (Comment)  Post-Hemodialysis Comments see notes.

## 2024-08-31 NOTE — Procedures (Signed)
 I was present at this dialysis session. I have reviewed the session itself and made appropriate changes.   Vital signs in last 24 hours:  Temp:  [97.6 F (36.4 C)-99.1 F (37.3 C)] 98.4 F (36.9 C) (12/16 1135) Pulse Rate:  [55-84] 55 (12/16 1200) Resp:  [10-23] 10 (12/16 1200) BP: (98-156)/(44-95) 127/56 (12/16 1200) SpO2:  [94 %-100 %] 100 % (12/16 1135) FiO2 (%):  [21 %] 21 % (12/16 0448) Weight:  [97.4 kg-98.4 kg] 97.4 kg (12/16 0245) Weight change:  Filed Weights   08/30/24 1830 08/31/24 0245  Weight: 98.4 kg 97.4 kg    Recent Labs  Lab 08/31/24 0501  NA 135  K 5.6*  CL 97*  CO2 28  GLUCOSE 81  BUN 43*  CREATININE 9.97*  CALCIUM  8.7*  PHOS 5.5*    Recent Labs  Lab 08/30/24 2004 08/31/24 0501 08/31/24 0703  WBC 6.6 5.9  --   NEUTROABS 4.8  --   --   HGB 7.2* 6.2* 6.4*  HCT 22.4* 19.3* 20.1*  MCV 94.9 95.1  --   PLT 192 192  --     Scheduled Meds:  sodium chloride    Intravenous Once   atorvastatin   40 mg Oral q morning   carvedilol   12.5 mg Oral BID WC   Chlorhexidine  Gluconate Cloth  6 each Topical Q0600   clopidogrel   75 mg Oral Daily   diphenhydrAMINE   25 mg Oral Once   insulin  aspart  0-6 Units Subcutaneous TID WC   lamoTRIgine   100 mg Oral BID   pantoprazole   40 mg Oral BID AC   sertraline   100 mg Oral Daily   Continuous Infusions:  anticoagulant sodium citrate      PRN Meds:.acetaminophen  **OR** acetaminophen , alteplase , anticoagulant sodium citrate , clonazePAM , lidocaine  (PF), lidocaine -prilocaine , ondansetron  **OR** ondansetron  (ZOFRAN ) IV, oxyCODONE , pentafluoroprop-tetrafluoroeth   Fairy DELENA Sellar,  MD 08/31/2024, 12:20 PM

## 2024-08-31 NOTE — Progress Notes (Signed)
 PROGRESS NOTE    [name]  FMW:[id number] DOB: 09/20/1960 DOA: 08/30/2024 PCP: System, Provider Not In   Brief Narrative:  [name] is a 63 y.o. female with medical history significant of hypertension, hyperlipidemia, GAD with panic attacks, asthma, CAD, STEMI with PCI, prior DVT on Eliquis , Bipolar disorder, DM 2, neuropathy, ESRD on HD (TTS), atrial fibrillation who presents to the emergency department due to Right neck pain around where dialysis catheter is located.  She also complained of intermittent chest pain that comes and goes and that she had similar chest pain several months ago.  She complains of recent cough with greenish phlegm, but denies fever, chills, vomiting, diarrhea.  She missed last dialysis on Saturday (2 days ago) due to pain and not feeling well.  Patient was admitted for hyperkalemia in the setting of missed hemodialysis and was also thought to have some bronchitis and/for community-acquired pneumonia, but procalcitonin is low and there are no significant chest x-ray findings noted.  She is now noted to have some worsening anemia requiring 1 unit PRBC transfusion with hemodialysis.   Assessment & Plan:   Principal Problem:   Hyperkalemia  Assessment and Plan:   Hyperkalemia K+ 5.7, Lokelma  was given, repeat 5.6 Nephrology plans for hemodialysis today   ESRD on HD (TTS) Patient missed dialysis on Saturday (12/13) Nephrologist consulted by EDP and will arrange for patient's dialysis   Questionable CAP POA CT suggestive of pneumonia, patient without any symptoms of pneumonia other than productive cough of green phlegm. Procalcitonin currently low and no significant leukocytosis Avoid antibiotics and continue to monitor   Acute on chronic anemia likely related to CKD Hemoglobin was 7.2 on admission yesterday 8, days dropped to 6.2 this morning, repeat hemoglobin 6.4 1 unit PRBC transfusion with hemodialysis today   History of CAD/STEMI with  PCI Continue Plavix , statin, Coreg    Type 2 diabetes mellitus with hyperglycemia Hemoglobin A1c on 05/06/2024 was 6.5.  Repeat A1c Continue ISS and hypoglycemia protocol   GAD/Bipolar Continue lamictal , sertraline , klonopin    History of GI bleed Continue Protonix   Obesity, class II BMI 38.04   DVT prophylaxis: SCDs Code Status: Full Family Communication: None at bedside Disposition Plan:  Status is: Observation The patient will require care spanning > 2 midnights and should be moved to inpatient because: Need for PRBC transfusion and hemodialysis.  Consultants:  Nephrology  Procedures:  None  Antimicrobials:  None  Subjective: Patient seen and evaluated today with complaints of neck and chest wall pain near her dialysis catheter site.  She is complaining of overall weakness and fatigue.  Objective: Vitals:   08/31/24 0746 08/31/24 0800 08/31/24 0900 08/31/24 1000  BP:  (!) 113/44 (!) 129/55 (!) 122/50  Pulse:  70 78 75  Resp:  14  15  Temp: 99.1 F (37.3 C)     TempSrc: Axillary     SpO2:  97% 95% 95%  Weight:      Height:        Intake/Output Summary (Last 24 hours) at 08/31/2024 1124 Last data filed at 08/31/2024 0800 Gross per 24 hour  Intake 240 ml  Output 450 ml  Net -210 ml   Filed Weights   08/30/24 1830 08/31/24 0245  Weight: 98.4 kg 97.4 kg    Examination:  General exam: Appears calm and comfortable  Respiratory system: Clear to auscultation. Respiratory effort normal. Cardiovascular system: S1 & S2 heard, RRR.  Gastrointestinal system: Abdomen is soft Central nervous system: Alert and awake  Extremities: No edema Skin: No significant lesions noted, right chest wall catheter C/D/I Psychiatry: Flat affect.    Data Reviewed: I have personally reviewed following labs and imaging studies  CBC: Recent Labs  Lab 08/30/24 2004 08/31/24 0501 08/31/24 0703  WBC 6.6 5.9  --   NEUTROABS 4.8  --   --   HGB 7.2* 6.2* 6.4*  HCT 22.4* 19.3*  20.1*  MCV 94.9 95.1  --   PLT 192 192  --    Basic Metabolic Panel: Recent Labs  Lab 08/30/24 2004 08/31/24 0501  NA 133* 135  K 5.7* 5.6*  CL 96* 97*  CO2 20* 28  GLUCOSE 120* 81  BUN 43* 43*  CREATININE 9.19* 9.97*  CALCIUM  8.9 8.7*  MG  --  1.9  PHOS  --  5.5*   GFR: Estimated Creatinine Clearance: 6.5 mL/min (A) (by C-G formula based on SCr of 9.97 mg/dL (H)). Liver Function Tests: Recent Labs  Lab 08/30/24 2004 08/31/24 0501  AST 12* <10*  ALT 6 6  ALKPHOS 70 62  BILITOT 0.6 0.5  PROT 9.3* 8.5*  ALBUMIN  3.5 3.3*   Recent Labs  Lab 08/30/24 2004  LIPASE 45   No results for input(s): AMMONIA in the last 168 hours. Coagulation Profile: No results for input(s): INR, PROTIME in the last 168 hours. Cardiac Enzymes: No results for input(s): CKTOTAL, CKMB, CKMBINDEX, TROPONINI in the last 168 hours. BNP (last 3 results) Recent Labs    06/25/24 2329  PROBNP 1,087.0*   HbA1C: No results for input(s): HGBA1C in the last 72 hours. CBG: Recent Labs  Lab 08/31/24 0228 08/31/24 0741  GLUCAP 86 116*   Lipid Profile: No results for input(s): CHOL, HDL, LDLCALC, TRIG, CHOLHDL, LDLDIRECT in the last 72 hours. Thyroid Function Tests: No results for input(s): TSH, T4TOTAL, FREET4, T3FREE, THYROIDAB in the last 72 hours. Anemia Panel: No results for input(s): VITAMINB12, FOLATE, FERRITIN, TIBC, IRON, RETICCTPCT in the last 72 hours. Sepsis Labs: Recent Labs  Lab 08/31/24 0501  PROCALCITON 0.26    Recent Results (from the past 240 hours)  MRSA Next Gen by PCR, Nasal     Status: None   Collection Time: 08/31/24  2:33 AM   Specimen: Nasal Mucosa; Nasal Swab  Result Value Ref Range Status   MRSA by PCR Next Gen NOT DETECTED NOT DETECTED Final    Comment: (NOTE) The GeneXpert MRSA Assay (FDA approved for NASAL specimens only), is one component of a comprehensive MRSA colonization surveillance program. It is  not intended to diagnose MRSA infection nor to guide or monitor treatment for MRSA infections. Test performance is not FDA approved in patients less than 75 years old. Performed at [facility], [address] [place], [place] [address]          Radiology Studies: CT ABDOMEN PELVIS WO CONTRAST Result Date: 08/30/2024 EXAM: CT ABDOMEN AND PELVIS WITHOUT CONTRAST 08/30/2024 08:42:14 PM TECHNIQUE: CT of the abdomen and pelvis was performed without the administration of intravenous contrast. Multiplanar reformatted images are provided for review. Automated exposure control, iterative reconstruction, and/or weight-based adjustment of the mA/kV was utilized to reduce the radiation dose to as low as reasonably achievable. COMPARISON: 06/26/2024 CLINICAL HISTORY: Abdominal pain, acute, nonlocalized. FINDINGS: LOWER CHEST: Patchy left lower lobe opacity, suspicious for pneumonia. Moderate 3-vessel coronary atherosclerosis. LIVER: The liver is unremarkable. GALLBLADDER AND BILE DUCTS: Status post cholecystectomy. No biliary ductal dilatation. SPLEEN: No acute abnormality. PANCREAS: No acute abnormality. ADRENAL GLANDS: No acute abnormality. KIDNEYS,  URETERS AND BLADDER: Punctate nonobstructing right lower pole renal calculus. No stones in the ureters. No hydronephrosis. No perinephric or periureteral stranding. Urinary bladder is unremarkable. GI AND BOWEL: Stomach demonstrates no acute abnormality. Status post appendectomy. There is no bowel obstruction. PERITONEUM AND RETROPERITONEUM: Mesenteric stranding along the anterior mid abdomen (image 52), progressive from recent priors, new from 2024. This appearance favors sclerosing mesenteritis. No ascites. No free air. VASCULATURE: Atherosclerotic calcifications of the abdominal aorta and branch vessels. LYMPH NODES: No discrete lymphadenopathy. REPRODUCTIVE ORGANS: Status post hysterectomy. BONES AND SOFT TISSUES: No acute osseous abnormality. No focal soft  tissue abnormality. IMPRESSION: 1. 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 2. Otherwise, no acute findings in the abdomen/pelvis. 3. Patchy left lower lobe opacity, suspicious for pneumonia. Electronically signed by: Pinkie Pebbles MD 08/30/2024 09:07 PM EST RP Workstation: [id number]   DG Chest Portable 1 View Result Date: 08/30/2024 EXAM: 1 VIEW(S) XRAY OF THE CHEST 08/30/2024 07:29:00 PM COMPARISON: 07/01/2024 CLINICAL HISTORY: chest pain FINDINGS: LINES, TUBES AND DEVICES: Stable right chest dialysis catheter with distal tip ending at the cavoatrial junction. LUNGS AND PLEURA: Stable chronically low lung volumes. No focal pulmonary opacity. No pleural effusion. No pneumothorax. HEART AND MEDIASTINUM: The distal tip of the right chest dialysis catheter is at the cavoatrial junction. No acute abnormality of the cardiac and mediastinal silhouettes. BONES AND SOFT TISSUES: No acute osseous abnormality. IMPRESSION: 1. No acute cardiopulmonary findings. 2. Stable right chest dialysis catheter with tip at the cavoatrial junction. Electronically signed by: Greig Pique MD 08/30/2024 07:38 PM EST RP Workstation: [id number]        Scheduled Meds:  sodium chloride    Intravenous Once   atorvastatin   40 mg Oral q morning   carvedilol   12.5 mg Oral BID WC   Chlorhexidine  Gluconate Cloth  6 each Topical Q0600   clopidogrel   75 mg Oral Daily   insulin  aspart  0-6 Units Subcutaneous TID WC   lamoTRIgine   100 mg Oral BID   pantoprazole   40 mg Oral BID AC   sertraline   100 mg Oral Daily     LOS: 0 days    Time spent: 55 minutes    Dazha Kempa JONETTA Fairly, DO [facility]  If 7PM-7AM, please contact night-coverage [email or web address] 08/31/2024, 11:24 AM

## 2024-08-31 NOTE — Progress Notes (Signed)
 Pt receives out-pt HD at [name], TTS, 0630am chair time. Will continue to assist as needed.   [facility] [phone]

## 2024-08-31 NOTE — TOC CM/SW Note (Signed)
 Transition of Care [facility]) - Inpatient Brief Assessment   Patient Details  Name: [name] MRN: [id number] Date of Birth: 07-Jul-1961  Transition of Care [facility]) CM/SW Contact:    Lucie Lunger, LCSWA Phone Number: 08/31/2024, 9:14 AM   Clinical Narrative: Transition of Care Department [facility]) has reviewed patient and no TOC needs have been identified at this time. We will continue to monitor patient advancement through interdiciplinary progression rounds. If new patient transition needs arise, please place a TOC consult.  Transition of Care Asessment: Insurance and Status: Insurance coverage has been reviewed Patient has primary care physician: Yes (System provider not listed, will add PCP list to AVS in case pt does not have PCP.) Home environment has been reviewed: From home Prior level of function:: Independent Prior/Current Home Services: No current home services Social Drivers of Health Review: SDOH reviewed no interventions necessary Readmission risk has been reviewed: Yes Transition of care needs: no transition of care needs at this time

## 2024-09-01 DIAGNOSIS — D62 Acute posthemorrhagic anemia: Secondary | ICD-10-CM | POA: Diagnosis not present

## 2024-09-01 DIAGNOSIS — E875 Hyperkalemia: Secondary | ICD-10-CM | POA: Diagnosis not present

## 2024-09-01 LAB — HEPATITIS B CORE ANTIBODY, TOTAL: HEP B CORE AB: NEGATIVE

## 2024-09-01 LAB — HEPATITIS B SURFACE ANTIGEN: Hepatitis B Surface Ag: NONREACTIVE

## 2024-09-01 LAB — BASIC METABOLIC PANEL WITH GFR
Anion gap: 10 (ref 5–15)
BUN: 40 mg/dL — ABNORMAL HIGH (ref 8–23)
CO2: 29 mmol/L (ref 22–32)
Calcium: 8.7 mg/dL — ABNORMAL LOW (ref 8.9–10.3)
Chloride: 95 mmol/L — ABNORMAL LOW (ref 98–111)
Creatinine, Ser: 8.25 mg/dL — ABNORMAL HIGH (ref 0.44–1.00)
GFR, Estimated: 5 mL/min — ABNORMAL LOW (ref >60)
Glucose, Bld: 123 mg/dL — ABNORMAL HIGH (ref 70–99)
Potassium: 5 mmol/L (ref 3.5–5.1)
Sodium: 133 mmol/L — ABNORMAL LOW (ref 135–145)

## 2024-09-01 LAB — GLUCOSE, CAPILLARY
Glucose-Capillary: 120 mg/dL — ABNORMAL HIGH (ref 70–99)
Glucose-Capillary: 127 mg/dL — ABNORMAL HIGH (ref 70–99)
Glucose-Capillary: 121 mg/dL — ABNORMAL HIGH (ref 70–99)

## 2024-09-01 LAB — VITAMIN B12: Vitamin B-12: 384 pg/mL (ref 180–914)

## 2024-09-01 LAB — HEPATITIS B E ANTIGEN: Hep B E Ag: NEGATIVE

## 2024-09-01 LAB — CBC
HCT: 19.8 % — ABNORMAL LOW (ref 36.0–46.0)
Hemoglobin: 6.3 g/dL — CL (ref 12.0–15.0)
MCH: 30.4 pg (ref 26.0–34.0)
MCHC: 31.8 g/dL (ref 30.0–36.0)
MCV: 95.7 fL (ref 80.0–100.0)
Platelets: 189 K/uL (ref 150–400)
RBC: 2.07 MIL/uL — ABNORMAL LOW (ref 3.87–5.11)
RDW: 16.6 % — ABNORMAL HIGH (ref 11.5–15.5)
WBC: 6.4 K/uL (ref 4.0–10.5)
nRBC: 0 % (ref 0.0–0.2)

## 2024-09-01 LAB — FOLATE: Folate: 6.5 ng/mL (ref >5.9)

## 2024-09-01 LAB — HEMOGLOBIN A1C
Hgb A1c MFr Bld: 5.3 % (ref 4.8–5.6)
Mean Plasma Glucose: 105.41 mg/dL

## 2024-09-01 LAB — HEPATITIS B SURFACE ANTIBODY, QUANTITATIVE: Hep B S AB Quant (Post): <3.5 m[IU]/mL — ABNORMAL LOW

## 2024-09-01 LAB — MAGNESIUM: Magnesium: 1.9 mg/dL (ref 1.7–2.4)

## 2024-09-01 MED ORDER — DARBEPOETIN ALFA NICU SYRINGE 40 MCG/0.4 ML (100 MCG/ML)
10.0000 ug/kg | PREFILLED_SYRINGE | INTRAMUSCULAR | Status: DC
  Filled 2024-09-01: qty 9.64

## 2024-09-01 MED ORDER — DIPHENHYDRAMINE HCL 50 MG/ML IJ SOLN
12.5000 mg | Freq: Once | INTRAMUSCULAR | Status: AC
  Administered 2024-09-01: 08:00:00 12.5 mg via INTRAVENOUS
  Filled 2024-09-01: qty 1

## 2024-09-01 MED ORDER — DARBEPOETIN ALFA 40 MCG/0.4ML IJ SOSY
40.0000 ug | PREFILLED_SYRINGE | INTRAMUSCULAR | Status: DC
  Filled 2024-09-01: qty 0.4

## 2024-09-01 MED ORDER — HEPARIN SODIUM (PORCINE) 1000 UNIT/ML IJ SOLN
INTRAMUSCULAR | Status: AC
  Filled 2024-09-01: qty 4

## 2024-09-01 MED ORDER — FENTANYL CITRATE (PF) 50 MCG/ML IJ SOSY
25.0000 ug | PREFILLED_SYRINGE | INTRAMUSCULAR | Status: DC | PRN
  Administered 2024-09-01: 20:00:00 25 ug via INTRAVENOUS
  Filled 2024-09-01: qty 1

## 2024-09-01 NOTE — Progress Notes (Signed)
 Blood completed, vital signs obtained and documented. Pt showing no signs of transfusion reaction.

## 2024-09-01 NOTE — Progress Notes (Signed)
 Patient alert and oriented to person,place,and situation,confused to time.Blood pressure 125/46,heart rate 90,Dr Clanford Johnson notified. Holding the coreg  this am. Plan of care on going.

## 2024-09-01 NOTE — Progress Notes (Signed)
 Nurse at bedside,patient receiving one unit of Pack Red blood cells,verified by two nurses,John Makar,RN.Error when trying to scan blood.Plan of care on going.

## 2024-09-01 NOTE — Progress Notes (Addendum)
 Patient's Hemoglobin and Hematocrit was 6.3/and 19.8 this am,Dr Clanford Johnson notified. Patient will receive one unit of Packed red blood cells this morning per MD's orders. Patient educated on blood transfusion and care notes given verbalized understanding. Plan of care on going.

## 2024-09-01 NOTE — Progress Notes (Signed)
 The patient has completed HD treatment and goal met. The patient had hypotension 84/53 mmhg during tx. The patient's dialysis catheter stops working when she cough.  09/01/24 1857  Vitals  Temp 98.6 F (37 C)  Temp Source Oral  BP (!) 113/57  BP Location Left Arm  BP Method Automatic  Patient Position (if appropriate) Lying  Resp 16  During Treatment Monitoring  Intra-Hemodialysis Comments Tx completed  Post Treatment  Dialyzer Clearance Lightly streaked  Hemodialysis Intake (mL) 0 mL  Liters Processed 70  Fluid Removed (mL) 1000 mL  Tolerated HD Treatment Yes  Post-Hemodialysis Comments see notes.

## 2024-09-01 NOTE — Progress Notes (Signed)
 Pt requested linens and hospital gown to be changed due to [facility] not working. After removing Purewick, this nurse noticed pts labia was irritated and bruised. Pt stated she has experienced this for the past two weeks prior to be being admitted to the hospital. She stated it did not hurt and she does not know what caused the irritation and bruising. Charge Nurse Particia Chain notified, picture was taken and uploaded to pts file. Pt says she is not in pain. Left a message for the day shift attending physician for further evaluation. Will continue to monitor pt for any further changes.

## 2024-09-01 NOTE — Plan of Care (Signed)

## 2024-09-01 NOTE — Consult Note (Signed)
 Gastroenterology Consult   Referring Provider: No ref. provider found Primary Care Physician:  System, Provider Not In Primary Gastroenterologist:  Dr. Cindie   Patient ID: [name]; [id number]; October 06, 1960   Admit date: 08/30/2024  LOS: 1 day   Date of Consultation: 09/01/2024  Reason for Consultation:  acute on chronic anemia   History of Present Illness   [name] is a 63 y.o. year old female history of CAD, NSTEMI s/p DES in 2022, DVT, diabetes, HLD, HTN, GERD, ESRD on HD TTS, gastroparesis, asthma, bipolar, A-fib on Eliquis , anemia of chronic disease with IDA requiring multiple blood transfusions and iron infusions over the last year with EGD in October with PHG, who presented to the ED 12/15 with c/o neck pain near dialysis catheter as well as productive cough, reportedly missing dialysis on Saturday. Admitted for hyperkalemia, thought to have bronchitis/CAP, with worsening anemia. GI consulted for further evaluation   Hgb 7.2 on admission, down to 6.2 with last result of 6.3 this morning 1 unit PRBCs ordered Iron panel pending  Sodium 133 calcium  8.7   CT A/P WO contrast: 08/30/24: Progressive mesenteric stranding along the anterior mid abdomen, favoring sclerosing mesenteritis. 2. Otherwise, no acute findings in the abdomen/pelvis. 3. Patchy left lower lobe opacity, suspicious for pneumonia.  Consult: Denies any rectal bleeding or melena. She has had some intermittent SOB but main complaint is fatigue since she began dialysis. She endorses occasional dizziness. Has some LLQ pain for the past days that is constant. Reports she has some looser stools since starting dialysis but is not having a BM every day. she has some nausea with dry heaves. Denies GERD, dysphagia, odynophagia.    Last EGD 06/30/24 - Normal esophagus. Subtle mucosal changes                            consistent with portal hypertensive gastropathy.                            Rule out H.  pylori. Status post gastric mucosal                            biopsy                           - Polyps/polypoid antral mucosa. Appeared benign                            and innocentstatus post biopsy                           -Normal duodenal bulb, second portion of the                            duodenum and third portion of the duodenum.  GASTRIC, POLYPECTOMY:       Gastric antral type mucosa with marked reactive changes.       No H. pylori identified on HE stain.       Negative for intestinal metaplasia or dysplasia.   B. GASTRIC, BIOPSY:       Gastric antral / oxyntic mucosa with chronic inactive gastritis and  intestinal metaplasia.       negative H  pylori on immunohistochemical stain      Negative for dysplasia.   Last Colonoscopy: done at [facility] 2014: complete to cecum, hemorrhoids, otherwise normal    Past Medical History:  Diagnosis Date   Anxiety    Asthma    Atrial fibrillation (HCC)    Bipolar 1 disorder (HCC)    Chronic back pain    Chronic chest pain    Diabetes mellitus without complication [facility])    Dialysis patient    [facility]arlean Everts, Saturday   DVT (deep venous thrombosis) (HCC) 2017   Gastroparesis    GERD (gastroesophageal reflux disease)    GI bleed 11/2023   required 2 units PRBC transfusion   Hyperlipemia    Hypertension    IBS (irritable bowel syndrome)    Normal cardiac stress test 02/14/2014   [facility]   Panic attacks     Past Surgical History:  Procedure Laterality Date   ABDOMINAL HYSTERECTOMY  2001   APPENDECTOMY  10/2005   CHOLECYSTECTOMY  10/2005   COLONOSCOPY  2007   Patel ([name])-pt reports hemorrhoids   ESOPHAGOGASTRODUODENOSCOPY  02/28/2006   [name], normal on daily PPI, myultiple hyperplastic polyps   ESOPHAGOGASTRODUODENOSCOPY N/A 06/29/2024   Procedure: EGD (ESOPHAGOGASTRODUODENOSCOPY);  Surgeon: Eartha Flavors, Toribio, MD;  Location: [facility] END[facility];  Service: Gastroenterology;  Laterality: N/A;    ESOPHAGOGASTRODUODENOSCOPY N/A 06/30/2024   Procedure: EGD (ESOPHAGOGASTRODUODENOSCOPY);  Surgeon: Shaaron Lamar HERO, MD;  Location: [facility] END[facility];  Service: Endoscopy;  Laterality: N/A;   INSERTION OF DIALYSIS CATHETER Right 07/01/2024   Procedure: INSERTION OF DIALYSIS CATHETER, tunneled catheter;  Surgeon: Kallie [name] BROCKS, MD;  Location: [facility] ORS;  Service: General;  Laterality: Right;   MOUTH SURGERY     RIGHT HEART CATH N/A 04/25/2023   Procedure: RIGHT HEART CATH;  Surgeon: Verlin Lonni BIRCH, MD;  Location: [facility] INVASIVE CV LAB;  Service: Cardiovascular;  Laterality: N/A;   RIGHT OOPHORECTOMY  1999    Prior to Admission medications  Medication Sig Start Date End Date Taking? Authorizing Provider  amLODipine  (NORVASC ) 10 MG tablet Take 10 mg by mouth daily. 12/17/23  Yes [provider]  albuterol  (VENTOLIN  HFA) 108 (90 Base) MCG/ACT inhaler Inhale 2 puffs into the lungs every 6 (six) hours as needed for wheezing or shortness of breath.    [provider]  apixaban  (ELIQUIS ) 5 MG TABS tablet Take 1 tablet (5 mg total) by mouth 2 (two) times daily. Continue hold medication until follow up/clerance by gastroenterology service. 07/04/24   Ricky Fines, MD  atorvastatin  (LIPITOR) 40 MG tablet Take 40 mg by mouth every morning.    [provider]  carvedilol  (COREG ) 12.5 MG tablet Take 1 tablet (12.5 mg total) by mouth 2 (two) times daily with a meal. 07/04/24   Ricky Fines, MD  carvedilol  (COREG ) 25 MG tablet Take 25 mg by mouth 2 (two) times daily with a meal.    [provider]  clonazePAM  (KLONOPIN ) 0.5 MG tablet Take 1 tablet (0.5 mg total) by mouth 2 (two) times daily as needed for anxiety. 05/18/24   Haze Lonni PARAS, MD  clopidogrel  (PLAVIX ) 75 MG tablet Take 75 mg by mouth daily.    [provider]  ergocalciferol (VITAMIN D2) 1.25 MG (50000 UT) capsule Take 50,000 Units by mouth every 30 (thirty) days. 06/01/24 06/01/25  [provider]  famotidine  (PEPCID ) 20 MG tablet Take 20 mg by mouth 2 (two) times daily.    [provider]  gabapentin  (NEURONTIN ) 300  MG capsule Take 300 mg by mouth 2 (two) times daily.    [provider]  HYDROcodone -acetaminophen  (NORCO/VICODIN) 5-325 MG tablet Take 1 tablet by mouth every 6 (six) hours as needed for moderate pain (pain score 4-6).    [provider]  hydrOXYzine  (ATARAX ) 25 MG tablet Take 25 mg by mouth every 12 (twelve) hours as needed for anxiety.    [provider]  isosorbide  mononitrate (IMDUR ) 30 MG 24 hr tablet Take 3 tablets (90 mg total) by mouth every morning. 07/05/24   Ricky Fines, MD  lamoTRIgine  (LAMICTAL ) 100 MG tablet Take 100 mg by mouth 2 (two) times daily.    [provider]  LANTUS  SOLOSTAR 100 UNIT/ML Solostar Pen Inject 50 Units into the skin 2 (two) times daily.    [provider]  midodrine (PROAMATINE) 5 MG tablet Take 5 mg by mouth 3 (three) times daily with meals.    [provider]  multivitamin (RENA-VIT) TABS tablet Take 1 tablet by mouth at bedtime. 07/04/24   Ricky Fines, MD  NOVOLOG  FLEXPEN 100 UNIT/ML FlexPen Inject 15 Units into the skin 4 (four) times daily. 4-5 times daily 05/13/15   [provider]  ondansetron  (ZOFRAN ) 4 MG tablet Take 1 tablet (4 mg total) by mouth every 6 (six) hours as needed for nausea or vomiting. 05/28/24   Jerral Meth, MD  ondansetron  (ZOFRAN -ODT) 4 MG disintegrating tablet Take 2 mg by mouth every 6 (six) hours as needed. 06/20/24   [provider]  pantoprazole  (PROTONIX ) 40 MG tablet Take 40 mg by mouth 2 (two) times daily before a meal.    [provider]  promethazine  (PHENERGAN ) 25 MG tablet Take 25 mg by mouth every 6 (six) hours as needed for nausea or vomiting.    [provider]  sertraline  (ZOLOFT ) 100 MG tablet Take 100 mg by mouth daily.    [provider]  sevelamer  carbonate (RENVELA )  800 MG tablet Take 800 mg by mouth 3 (three) times daily with meals.    [provider]  SYMBICORT 160-4.5 MCG/ACT inhaler Inhale 2 puffs into the lungs daily at 6 (six) AM. Patient not taking: Reported on 06/26/2024 02/03/24   [provider]    Current Facility-Administered Medications  Medication Dose Route Frequency Provider Last Rate Last Admin   0.9 %  sodium chloride  infusion (Manually program via Guardrails IV Fluids)   Intravenous Once Maree, Pratik D, DO       acetaminophen  (TYLENOL ) tablet 650 mg  650 mg Oral Q6H PRN Adefeso, Oladapo, DO   650 mg at 09/01/24 0830   Or   acetaminophen  (TYLENOL ) suppository 650 mg  650 mg Rectal Q6H PRN Adefeso, Oladapo, DO       atorvastatin  (LIPITOR) tablet 40 mg  40 mg Oral q morning Adefeso, Oladapo, DO   40 mg at 09/01/24 0920   carvedilol  (COREG ) tablet 12.5 mg  12.5 mg Oral BID WC Adefeso, Oladapo, DO       Chlorhexidine  Gluconate Cloth 2 % PADS 6 each  6 each Topical Q0600 Tobie Gordy POUR, MD   6 each at 09/01/24 0526   clonazePAM  (KLONOPIN ) tablet 0.5 mg  0.5 mg Oral BID PRN Adefeso, Oladapo, DO       clopidogrel  (PLAVIX ) tablet 75 mg  75 mg Oral Daily Adefeso, Oladapo, DO   75 mg at 09/01/24 0919   Darbepoetin Alfa  (ARANESP ) injection 40 mcg  40 mcg Subcutaneous Q Wed-1800 Johnson, Afton CROME, MD  fentaNYL  (SUBLIMAZE ) injection 25 mcg  25 mcg Intravenous Q2H PRN Johnson, Clanford L, MD       insulin  aspart (novoLOG ) injection 0-6 Units  0-6 Units Subcutaneous TID WC Adefeso, Oladapo, DO   1 Units at 08/31/24 1843   lamoTRIgine  (LAMICTAL ) tablet 100 mg  100 mg Oral BID Adefeso, Oladapo, DO   100 mg at 09/01/24 9080   ondansetron  (ZOFRAN ) tablet 4 mg  4 mg Oral Q6H PRN Adefeso, Oladapo, DO       Or   ondansetron  (ZOFRAN ) injection 4 mg  4 mg Intravenous Q6H PRN Adefeso, Oladapo, DO   4 mg at 08/31/24 1228   oxyCODONE  (Oxy IR/ROXICODONE ) immediate release tablet 5 mg  5 mg Oral Q6H PRN Maree, Pratik D, DO   5 mg at 09/01/24  9475   pantoprazole  (PROTONIX ) EC tablet 40 mg  40 mg Oral BID AC Adefeso, Oladapo, DO   40 mg at 09/01/24 9171   sertraline  (ZOLOFT ) tablet 100 mg  100 mg Oral Daily Adefeso, Oladapo, DO   100 mg at 09/01/24 0919    Allergies as of 08/30/2024 - Review Complete 08/30/2024  Allergen Reaction Noted   Gelatin Swelling 09/10/2014   Iodinated contrast media Anaphylaxis, Hives, Itching, and Swelling 10/28/2014   Methylprednisolone  sodium succ Shortness Of Breath 05/21/2016   Dicyclomine hcl Anxiety and Other (See Comments) 10/14/2014   Doxycycline Hives and Rash 05/21/2016   Glutamic acid Itching and Swelling 07/14/2017   Ketorolac Hives and Rash 05/21/2016   Lisinopril  Swelling 03/31/2017   Metformin Nausea And Vomiting and Other (See Comments) 10/14/2014   Nitroglycerin  Nausea And Vomiting and Swelling 09/10/2014   Nsaids Other (See Comments) 07/14/2017   Other Itching, Swelling, and Other (See Comments) 05/21/2016   Quetiapine Other (See Comments) 05/28/2023   Vancomycin  11/13/2020   Blueberry flavoring agent (non-screening) Nausea And Vomiting and Rash 07/14/2017   Cephalexin  Nausea And Vomiting and Nausea Only 07/14/2017   Fish allergy Rash 11/05/2020   Ibuprofen Nausea And Vomiting 10/21/2006   Nitrofuran derivatives Itching and Nausea And Vomiting 11/26/2011   Shellfish allergy Itching, Swelling, and Rash 07/14/2017   Strawberry flavoring agent (non-screening) Nausea And Vomiting and Rash 07/14/2017   Sulfa antibiotics Nausea And Vomiting and Other (See Comments) 05/20/2015    Family History  Problem Relation Age of Onset   Cirrhosis Mother 19       ?etiology   Cirrhosis Father        ?etiology   Cirrhosis Sister 110       ?etiology   Diverticulitis Sister     Social History   Socioeconomic History   Marital status: Married    Spouse name: Not on file   Number of children: 4   Years of education: Not on file   Highest education level: Not on file  Occupational  History   Occupation: disabled  Tobacco Use   Smoking status: Passive Smoke Exposure - Never Smoker   Smokeless tobacco: Never  Vaping Use   Vaping status: Never Used  Substance and Sexual Activity   Alcohol use: No   Drug use: No   Sexual activity: Not Currently  Other Topics Concern   Not on file  Social History Narrative   Lives w/ husband, daughter, son-in-law & grandkids   Social Drivers of Health   Tobacco Use: Medium Risk (08/31/2024)   Patient History    Smoking Tobacco Use: Passive Smoke Exposure - Never Smoker    Smokeless Tobacco Use: Never  Passive Exposure: Yes  Financial Resource Strain: Not on file  Food Insecurity: No Food Insecurity (08/31/2024)   Epic    Worried About Programme Researcher, Broadcasting/film/video in the Last Year: Never true    Ran Out of Food in the Last Year: Never true  Transportation Needs: No Transportation Needs (08/31/2024)   Epic    Lack of Transportation (Medical): No    Lack of Transportation (Non-Medical): No  Physical Activity: Not on file  Stress: Not on file  Social Connections: Moderately Integrated (08/31/2024)   Social Connection and Isolation Panel    Frequency of Communication with Friends and Family: More than three times a week    Frequency of Social Gatherings with Friends and Family: More than three times a week    Attends Religious Services: More than 4 times per year    Active Member of [employer] or Organizations: No    Attends Banker Meetings: Never    Marital Status: Married  Catering Manager Violence: Not At Risk (08/31/2024)   Epic    Fear of Current or Ex-Partner: No    Emotionally Abused: No    Physically Abused: No    Sexually Abused: No  Depression (PHQ2-9): Not on file  Alcohol Screen: Not on file  Housing: Low Risk (08/31/2024)   Epic    Unable to Pay for Housing in the Last Year: No    Number of Times Moved in the Last Year: 1    Homeless in the Last Year: No  Utilities: Not At Risk (08/31/2024)   Epic     Threatened with loss of utilities: No  Health Literacy: Not on file     Review of Systems   Gen: Denies any fever, chills, loss of appetite, change in weight or weight loss CV: Denies chest pain, heart palpitations, syncope, edema  Resp: Denies shortness of breath with rest, cough, wheezing, coughing up blood, and pleurisy. GI: denies melena, hematochezia, nausea, vomiting, diarrhea, constipation, dysphagia, odyonophagia, early satiety or weight loss. +LLQ pain GU : Denies urinary burning, blood in urine, urinary frequency, and urinary incontinence. MS: Denies joint pain, limitation of movement, swelling, cramps, and atrophy.  Derm: Denies rash, itching, dry skin, hives. Psych: Denies depression, anxiety, memory loss, hallucinations, and confusion. Heme: Denies bruising or bleeding Neuro:  Denies any headaches, dizziness, paresthesias, shaking  Physical Exam   Vital Signs in last 24 hours: Temp:  [97.8 F (36.6 C)-100 F (37.8 C)] 98.8 F (37.1 C) (12/17 1200) Pulse Rate:  [74-94] 85 (12/17 1200) Resp:  [16-20] 20 (12/17 1200) BP: (109-140)/(46-61) 129/60 (12/17 1200) SpO2:  [91 %-100 %] 95 % (12/17 1200) Weight:  [96.4 kg] 96.4 kg (12/16 1358)   General:   Alert,  Well-developed, well-nourished, pleasant and cooperative in NAD Head:  Normocephalic and atraumatic. Eyes:  Sclera clear, no icterus.   Conjunctiva pink. Ears:  Normal auditory acuity. Mouth:  No deformity or lesions, dentition normal. Neck:  Supple; no masses Lungs:  Clear throughout to auscultation.   No wheezes, crackles, or rhonchi. No acute distress. Heart:  Regular rate and rhythm; no murmurs, clicks, rubs,  or gallops. Abdomen:  Soft, and nondistended. LLQ TTP. No masses, hepatosplenomegaly or hernias noted. Normal bowel sounds, without guarding, and without rebound.   Msk:  Symmetrical without gross deformities. Normal posture. Extremities:  Without clubbing or edema. Neurologic:  Alert and  oriented  x4. Skin:  Intact without significant lesions or rashes. Psych:  Alert and cooperative. Normal mood and  affect.  Intake/Output from previous day: 12/16 0701 - 12/17 0700 In: 480 [P.O.:480] Out: 1450 [Urine:450] Intake/Output this shift: No intake/output data recorded.   Labs/Studies   Recent Labs Recent Labs    08/30/24 2004 08/31/24 0501 08/31/24 0703 09/01/24 0408  WBC 6.6 5.9  --  6.4  HGB 7.2* 6.2* 6.4* 6.3*  HCT 22.4* 19.3* 20.1* 19.8*  PLT 192 192  --  189   BMET Recent Labs    08/30/24 2004 08/31/24 0501 09/01/24 0408  NA 133* 135 133*  K 5.7* 5.6* 5.0  CL 96* 97* 95*  CO2 20* 28 29  GLUCOSE 120* 81 123*  BUN 43* 43* 40*  CREATININE 9.19* 9.97* 8.25*  CALCIUM  8.9 8.7* 8.7*   LFT Recent Labs    08/30/24 2004 08/31/24 0501  PROT 9.3* 8.5*  ALBUMIN  3.5 3.3*  AST 12* <10*  ALT 6 6  ALKPHOS 70 62  BILITOT 0.6 0.5   PT/INR No results for input(s): LABPROT, INR in the last 72 hours. Hepatitis Panel Recent Labs    08/30/24 2134  HEPBSAG NON REACTIVE   C-Diff No results for input(s): CDIFFTOX in the last 72 hours.  Radiology/Studies CT ABDOMEN PELVIS WO CONTRAST Result Date: 08/30/2024 EXAM: CT ABDOMEN AND PELVIS WITHOUT CONTRAST 08/30/2024 08:42:14 PM TECHNIQUE: CT of the abdomen and pelvis was performed without the administration of intravenous contrast. Multiplanar reformatted images are provided for review. Automated exposure control, iterative reconstruction, and/or weight-based adjustment of the mA/kV was utilized to reduce the radiation dose to as low as reasonably achievable. COMPARISON: 06/26/2024 CLINICAL HISTORY: Abdominal pain, acute, nonlocalized. FINDINGS: LOWER CHEST: Patchy left lower lobe opacity, suspicious for pneumonia. Moderate 3-vessel coronary atherosclerosis. LIVER: The liver is unremarkable. GALLBLADDER AND BILE DUCTS: Status post cholecystectomy. No biliary ductal dilatation. SPLEEN: No acute abnormality. PANCREAS: No  acute abnormality. ADRENAL GLANDS: No acute abnormality. KIDNEYS, URETERS AND BLADDER: Punctate nonobstructing right lower pole renal calculus. No stones in the ureters. No hydronephrosis. No perinephric or periureteral stranding. Urinary bladder is unremarkable. GI AND BOWEL: Stomach demonstrates no acute abnormality. Status post appendectomy. There is no bowel obstruction. PERITONEUM AND RETROPERITONEUM: Mesenteric stranding along the anterior mid abdomen (image 52), progressive from recent priors, new from 2024. This appearance favors sclerosing mesenteritis. No ascites. No free air. VASCULATURE: Atherosclerotic calcifications of the abdominal aorta and branch vessels. LYMPH NODES: No discrete lymphadenopathy. REPRODUCTIVE ORGANS: Status post hysterectomy. BONES AND SOFT TISSUES: No acute osseous abnormality. No focal soft tissue abnormality. IMPRESSION: 1. 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 2. Otherwise, no acute findings in the abdomen/pelvis. 3. Patchy left lower lobe opacity, suspicious for pneumonia. Electronically signed by: Pinkie Pebbles MD 08/30/2024 09:07 PM EST RP Workstation: [id number]   DG Chest Portable 1 View Result Date: 08/30/2024 EXAM: 1 VIEW(S) XRAY OF THE CHEST 08/30/2024 07:29:00 PM COMPARISON: 07/01/2024 CLINICAL HISTORY: chest pain FINDINGS: LINES, TUBES AND DEVICES: Stable right chest dialysis catheter with distal tip ending at the cavoatrial junction. LUNGS AND PLEURA: Stable chronically low lung volumes. No focal pulmonary opacity. No pleural effusion. No pneumothorax. HEART AND MEDIASTINUM: The distal tip of the right chest dialysis catheter is at the cavoatrial junction. No acute abnormality of the cardiac and mediastinal silhouettes. BONES AND SOFT TISSUES: No acute osseous abnormality. IMPRESSION: 1. No acute cardiopulmonary findings. 2. Stable right chest dialysis catheter with tip at the cavoatrial junction.  Electronically signed by: Greig Pique MD 08/30/2024 07:38 PM EST RP Workstation: [id number]    Assessment  [name] is a 63 y.o. year old female with multiple co-morbidities to include ESRD on dialysis, on Eliquis  and plavix , anemia of chronic disease with IDA requiring multiple blood transfusions and iron  infusions over the last year with EGD in October with PHG, who presented to the ED 12/15 with c/o neck pain near dialysis catheter as well as productive cough, reportedly missing dialysis on Saturday. Admitted for hyperkalemia, thought to have bronchitis/CAP, with worsening anemia. GI consulted for further evaluation   Acute on chronic anemia: -Hgb 7.2 on admission, down to 6.2 with last result of 6.3 this morning -1 unit PRBCs ordered -Iron  panel pending  -EGD in October with PHG -last TCS in 2014 -recommended to follow up with GI as outpatient for likely colonoscopy but lost to follow up -Iron  panel In October with Iron  32, TIBC 214 sat 15 ferritin 326 -b12 in August was 172, no recent check of this -no rectal bleeding or melena.   Case discussed with Dr. Cindie, given anemia is chronic and likely multifactorial, would optimize her hemoglobin with transfusions and plan for outpatient colonoscopy unless she has overt GI bleeding. Will check B12 and folate as Iron  studies in October were WNL.   Plan / Recommendations   -PPI BID -trend h&h -monitor for overt GI bleeding -will check B12 and folate levels -transfuse as needed for hgb 7 or less -outpatient colonoscopy unless overt bleeding or continued downtrend in Hemoglobin     09/01/2024, 1:30 PM  Shaili Donalson L. Caelie [place], MSN, APRN, AGNP-C Adult-Gerontology Nurse Practitioner [facility] at [facility]

## 2024-09-01 NOTE — Progress Notes (Signed)
 UNMATCHED BLOOD PRODUCT NOTE  Compare the patient ID on the blood tag to the patient ID on the hospital armband and Blood Bank armband. Then confirm the unit number on the blood tag matches the unit number on the blood product.  If a discrepancy is discovered return the product to blood bank immediately.   Blood Product Type: Packed Red Blood Cells  Unit #: (Found on blood product bag, begins with W) [id number]  Product Code #: (Found on blood product bag, begins with E) [id number]   Start Time: 0843  Starting Rate: 120 ml/hr  Rate increase/decreased  (if applicable):      ml/hr  Rate changed time (if applicable):    Stop Time: PENDING   All Other Documentation should be documented within the Blood Admin Flowsheet per policy.

## 2024-09-01 NOTE — Progress Notes (Addendum)
 PROGRESS NOTE   [name]  FMW:[id number] DOB: [birth year] DOA: 08/30/2024 PCP: System, Provider Not In   Chief Complaint  Patient presents with   Vascular Access Problem   Level of care: Telemetry  Brief Admission History:  63 y.o. female with medical history significant of hypertension, hyperlipidemia, GAD with panic attacks, asthma, CAD, STEMI with PCI, prior DVT on Eliquis , Bipolar disorder, DM 2, neuropathy, ESRD on HD (TTS), atrial fibrillation who presents to the emergency department due to right neck pain around where dialysis catheter is located.  She also complained of intermittent chest pain that comes and goes and that she had similar chest pain several months ago.  She complains of recent cough with greenish phlegm, but denies fever, chills, vomiting, diarrhea.  She missed last dialysis on Saturday (2 days ago) due to pain and not feeling well.   Patient was admitted for hyperkalemia in the setting of missed hemodialysis and was also thought to have some bronchitis and/for community-acquired pneumonia, but procalcitonin is low and there are no significant chest x-ray findings noted.  She is now noted to have some worsening anemia requiring 1 unit PRBC transfusion.     Assessment and Plan:  Hyperkalemia -- treated and resolved  Nephrology plans for more hemodialysis today and back on schedule tomorrow   ESRD on HD (TTS) Patient missed dialysis on Saturday (12/13) Nephrologist consulted by EDP and will arrange for patient's dialysis   Viral CAP POA CT suggestive of pneumonia, patient without any symptoms of pneumonia other than productive cough of green phlegm. Procalcitonin currently low and no significant leukocytosis Holding antibiotics and continue to monitor Treating supportively as a viral pneumonia   Acute on chronic anemia  Hemoglobin was 7.2 on admission, dropped to 6.2, repeat hemoglobin 6.4 1 unit PRBC transfusion with hemodialysis today Recheck CBC in AM  and if needed, transfuse further with HD tomorrow  EGD in Oct 2025 was reassuring but no recent colonoscopy Will check with GI to see if it is needed this admission.    History of CAD/STEMI with PCI Continue Plavix , statin, Coreg   Left wrist pain/stiffness -- she is left hand dominant and symptoms concerning for carpal tunnel strain -- pt agreeable to trial for a left wrist splint to wear nightly to relieve carpal tunnel   Type 2 diabetes mellitus with hyperglycemia Hemoglobin A1c on 05/06/2024 was 6.5.  Repeat A1c Continue ISS and hypoglycemia protocol  CBG (last 3)  Recent Labs    08/31/24 1937 09/01/24 0724 09/01/24 1113  GLUCAP 163* 120* 127*    GAD/Bipolar Continue lamictal , sertraline , klonopin    History of GI bleed Continue Protonix    Obesity, class II BMI 38.04  DVT prophylaxis: SCDs Code Status: FULL  Family Communication:  Disposition:    Consultants:  Nephrology GI  Procedures:   Antimicrobials:    Subjective: Pt having issues with her left wrist and coughing at night.    Objective: Vitals:   09/01/24 0324 09/01/24 0825 09/01/24 0858 09/01/24 1200  BP: (!) 140/47 (!) 125/46 (!) 132/51 129/60  Pulse: 92 90 94 85  Resp: 20 18 18 20   Temp: 97.8 F (36.6 C) 98.6 F (37 C) 98.7 F (37.1 C) 98.8 F (37.1 C)  TempSrc:  Oral Oral Oral  SpO2: 96% 91% 94% 95%  Weight:      Height:        Intake/Output Summary (Last 24 hours) at 09/01/2024 1313 Last data filed at 09/01/2024 0551 Gross per 24 hour  Intake  240 ml  Output 1000 ml  Net -760 ml   Filed Weights   08/31/24 0245 08/31/24 1135 08/31/24 1358  Weight: 97.4 kg 97.4 kg 96.4 kg   Examination:  General exam: Appears calm and comfortable  Respiratory system: no increased work of breathing.  Cardiovascular system: normal S1 & S2 heard. No JVD, murmurs, rubs, gallops or clicks. No pedal edema. Gastrointestinal system: Abdomen is nondistended, soft and nontender. No organomegaly or masses  felt. Normal bowel sounds heard. Central nervous system: Alert and oriented. No focal neurological deficits. Extremities: Symmetric 5 x 5 power. Skin: No rashes, lesions or ulcers. Psychiatry: Judgement and insight appear normal. Mood & affect appropriate.   Data Reviewed: I have personally reviewed following labs and imaging studies  CBC: Recent Labs  Lab 08/30/24 2004 08/31/24 0501 08/31/24 0703 09/01/24 0408  WBC 6.6 5.9  --  6.4  NEUTROABS 4.8  --   --   --   HGB 7.2* 6.2* 6.4* 6.3*  HCT 22.4* 19.3* 20.1* 19.8*  MCV 94.9 95.1  --  95.7  PLT 192 192  --  189    Basic Metabolic Panel: Recent Labs  Lab 08/30/24 2004 08/31/24 0501 09/01/24 0408  NA 133* 135 133*  K 5.7* 5.6* 5.0  CL 96* 97* 95*  CO2 20* 28 29  GLUCOSE 120* 81 123*  BUN 43* 43* 40*  CREATININE 9.19* 9.97* 8.25*  CALCIUM  8.9 8.7* 8.7*  MG  --  1.9 1.9  PHOS  --  5.5*  --     CBG: Recent Labs  Lab 08/31/24 1140 08/31/24 1834 08/31/24 1937 09/01/24 0724 09/01/24 1113  GLUCAP 123* 172* 163* 120* 127*    Recent Results (from the past 240 hours)  MRSA Next Gen by PCR, Nasal     Status: None   Collection Time: 08/31/24  2:33 AM   Specimen: Nasal Mucosa; Nasal Swab  Result Value Ref Range Status   MRSA by PCR Next Gen NOT DETECTED NOT DETECTED Final    Comment: (NOTE) The GeneXpert MRSA Assay (FDA approved for NASAL specimens only), is one component of a comprehensive MRSA colonization surveillance program. It is not intended to diagnose MRSA infection nor to guide or monitor treatment for MRSA infections. Test performance is not FDA approved in patients less than 2 years old. Performed at [facility], [address], [place], [place] [address]      Radiology Studies: CT ABDOMEN PELVIS WO CONTRAST Result Date: 08/30/2024 EXAM: CT ABDOMEN AND PELVIS WITHOUT CONTRAST 08/30/2024 08:42:14 PM TECHNIQUE: CT of the abdomen and pelvis was performed without the administration of intravenous  contrast. Multiplanar reformatted images are provided for review. Automated exposure control, iterative reconstruction, and/or weight-based adjustment of the mA/kV was utilized to reduce the radiation dose to as low as reasonably achievable. COMPARISON: 06/26/2024 CLINICAL HISTORY: Abdominal pain, acute, nonlocalized. FINDINGS: LOWER CHEST: Patchy left lower lobe opacity, suspicious for pneumonia. Moderate 3-vessel coronary atherosclerosis. LIVER: The liver is unremarkable. GALLBLADDER AND BILE DUCTS: Status post cholecystectomy. No biliary ductal dilatation. SPLEEN: No acute abnormality. PANCREAS: No acute abnormality. ADRENAL GLANDS: No acute abnormality. KIDNEYS, URETERS AND BLADDER: Punctate nonobstructing right lower pole renal calculus. No stones in the ureters. No hydronephrosis. No perinephric or periureteral stranding. Urinary bladder is unremarkable. GI AND BOWEL: Stomach demonstrates no acute abnormality. Status post appendectomy. There is no bowel obstruction. PERITONEUM AND RETROPERITONEUM: Mesenteric stranding along the anterior mid abdomen (image 52), progressive from recent priors, new from 2024. This appearance favors sclerosing  mesenteritis. No ascites. No free air. VASCULATURE: Atherosclerotic calcifications of the abdominal aorta and branch vessels. LYMPH NODES: No discrete lymphadenopathy. REPRODUCTIVE ORGANS: Status post hysterectomy. BONES AND SOFT TISSUES: No acute osseous abnormality. No focal soft tissue abnormality. IMPRESSION: 1. 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 2. Otherwise, no acute findings in the abdomen/pelvis. 3. Patchy left lower lobe opacity, suspicious for pneumonia. Electronically signed by: Pinkie Pebbles MD 08/30/2024 09:07 PM EST RP Workstation: [id number]   DG Chest Portable 1 View Result Date: 08/30/2024 EXAM: 1 VIEW(S) XRAY OF THE CHEST 08/30/2024 07:29:00 PM COMPARISON: 07/01/2024 CLINICAL HISTORY:  chest pain FINDINGS: LINES, TUBES AND DEVICES: Stable right chest dialysis catheter with distal tip ending at the cavoatrial junction. LUNGS AND PLEURA: Stable chronically low lung volumes. No focal pulmonary opacity. No pleural effusion. No pneumothorax. HEART AND MEDIASTINUM: The distal tip of the right chest dialysis catheter is at the cavoatrial junction. No acute abnormality of the cardiac and mediastinal silhouettes. BONES AND SOFT TISSUES: No acute osseous abnormality. IMPRESSION: 1. No acute cardiopulmonary findings. 2. Stable right chest dialysis catheter with tip at the cavoatrial junction. Electronically signed by: Greig Pique MD 08/30/2024 07:38 PM EST RP Workstation: [id number]    Scheduled Meds:  sodium chloride    Intravenous Once   atorvastatin   40 mg Oral q morning   carvedilol   12.5 mg Oral BID WC   Chlorhexidine  Gluconate Cloth  6 each Topical Q0600   clopidogrel   75 mg Oral Daily   darbepoetin (ARANESP ) injection - DIALYSIS  40 mcg Subcutaneous Q Wed-1800   insulin  aspart  0-6 Units Subcutaneous TID WC   lamoTRIgine   100 mg Oral BID   pantoprazole   40 mg Oral BID AC   sertraline   100 mg Oral Daily   Continuous Infusions:   LOS: 1 day   Time spent: 55 mins  Danya Spearman Vicci, MD How to contact the [facility] Attending or Consulting provider 7A - 7P or covering provider during after hours 7P -7A, for this patient?  Check the care team in [facility] and look for a) attending/consulting [facility] provider listed and b) the [facility] team listed Log into [email or web address] to find provider on call.  Locate the [facility] provider you are looking for under [facility] and page to a number that you can be directly reached. If you still have difficulty reaching the provider, please page the [facility] (Director on Call) for the Hospitalists listed on amion for assistance.  09/01/2024, 1:13 PM

## 2024-09-01 NOTE — Progress Notes (Signed)
 Consent for blood products obtained and placed in Pt's paper chart.

## 2024-09-01 NOTE — Progress Notes (Addendum)
 Patient ID: Daine Croker, female   DOB: 1960/10/20, 63 y.o.   MRN: [id number] S: Reports that she felt like she was choking last night and it interfered with HD so she signed off early.  Feeling a little better this morning and is receiving blood. O:BP (!) 132/51   Pulse 94   Temp 98.7 F (37.1 C) (Oral)   Resp 18   Ht 5' 3 (1.6 m)   Wt 96.4 kg   SpO2 94%   BMI 37.65 kg/m   Intake/Output Summary (Last 24 hours) at 09/01/2024 0943 Last data filed at 09/01/2024 0551 Gross per 24 hour  Intake 240 ml  Output 1000 ml  Net -760 ml   Intake/Output: I/O last 3 completed shifts: In: 480 [P.O.:480] Out: 1450 [Urine:450; Other:1000]  Intake/Output this shift:  No intake/output data recorded. Weight change: -1 kg Gen: NAD CVS: RRR Resp:CTA Abd: +BS, soft, NT/ND Ext: no edema  Recent Labs  Lab 08/30/24 2004 08/31/24 0501 09/01/24 0408  NA 133* 135 133*  K 5.7* 5.6* 5.0  CL 96* 97* 95*  CO2 20* 28 29  GLUCOSE 120* 81 123*  BUN 43* 43* 40*  CREATININE 9.19* 9.97* 8.25*  ALBUMIN  3.5 3.3*  --   CALCIUM  8.9 8.7* 8.7*  PHOS  --  5.5*  --   AST 12* <10*  --   ALT 6 6  --    Liver Function Tests: Recent Labs  Lab 08/30/24 2004 08/31/24 0501  AST 12* <10*  ALT 6 6  ALKPHOS 70 62  BILITOT 0.6 0.5  PROT 9.3* 8.5*  ALBUMIN  3.5 3.3*   Recent Labs  Lab 08/30/24 2004  LIPASE 45   No results for input(s): AMMONIA in the last 168 hours. CBC: Recent Labs  Lab 08/30/24 2004 08/31/24 0501 08/31/24 0703 09/01/24 0408  WBC 6.6 5.9  --  6.4  NEUTROABS 4.8  --   --   --   HGB 7.2* 6.2* 6.4* 6.3*  HCT 22.4* 19.3* 20.1* 19.8*  MCV 94.9 95.1  --  95.7  PLT 192 192  --  189   Cardiac Enzymes: No results for input(s): CKTOTAL, CKMB, CKMBINDEX, TROPONINI in the last 168 hours. CBG: Recent Labs  Lab 08/31/24 0741 08/31/24 1140 08/31/24 1834 08/31/24 1937 09/01/24 0724  GLUCAP 116* 123* 172* 163* 120*    Iron  Studies: No results for input(s): IRON ,  TIBC, TRANSFERRIN, FERRITIN in the last 72 hours. Studies/Results: CT ABDOMEN PELVIS WO CONTRAST Result Date: 08/30/2024 EXAM: CT ABDOMEN AND PELVIS WITHOUT CONTRAST 08/30/2024 08:42:14 PM TECHNIQUE: CT of the abdomen and pelvis was performed without the administration of intravenous contrast. Multiplanar reformatted images are provided for review. Automated exposure control, iterative reconstruction, and/or weight-based adjustment of the mA/kV was utilized to reduce the radiation dose to as low as reasonably achievable. COMPARISON: 06/26/2024 CLINICAL HISTORY: Abdominal pain, acute, nonlocalized. FINDINGS: LOWER CHEST: Patchy left lower lobe opacity, suspicious for pneumonia. Moderate 3-vessel coronary atherosclerosis. LIVER: The liver is unremarkable. GALLBLADDER AND BILE DUCTS: Status post cholecystectomy. No biliary ductal dilatation. SPLEEN: No acute abnormality. PANCREAS: No acute abnormality. ADRENAL GLANDS: No acute abnormality. KIDNEYS, URETERS AND BLADDER: Punctate nonobstructing right lower pole renal calculus. No stones in the ureters. No hydronephrosis. No perinephric or periureteral stranding. Urinary bladder is unremarkable. GI AND BOWEL: Stomach demonstrates no acute abnormality. Status post appendectomy. There is no bowel obstruction. PERITONEUM AND RETROPERITONEUM: Mesenteric stranding along the anterior mid abdomen (image 52), progressive from recent priors, new from 2024. This  appearance favors sclerosing mesenteritis. No ascites. No free air. VASCULATURE: Atherosclerotic calcifications of the abdominal aorta and branch vessels. LYMPH NODES: No discrete lymphadenopathy. REPRODUCTIVE ORGANS: Status post hysterectomy. BONES AND SOFT TISSUES: No acute osseous abnormality. No focal soft tissue abnormality. IMPRESSION: 1. 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 2. Otherwise, no acute findings in the abdomen/pelvis. 3. Patchy left  lower lobe opacity, suspicious for pneumonia. Electronically signed by: Pinkie Pebbles MD 08/30/2024 09:07 PM EST RP Workstation: [id number]   DG Chest Portable 1 View Result Date: 08/30/2024 EXAM: 1 VIEW(S) XRAY OF THE CHEST 08/30/2024 07:29:00 PM COMPARISON: 07/01/2024 CLINICAL HISTORY: chest pain FINDINGS: LINES, TUBES AND DEVICES: Stable right chest dialysis catheter with distal tip ending at the cavoatrial junction. LUNGS AND PLEURA: Stable chronically low lung volumes. No focal pulmonary opacity. No pleural effusion. No pneumothorax. HEART AND MEDIASTINUM: The distal tip of the right chest dialysis catheter is at the cavoatrial junction. No acute abnormality of the cardiac and mediastinal silhouettes. BONES AND SOFT TISSUES: No acute osseous abnormality. IMPRESSION: 1. No acute cardiopulmonary findings. 2. Stable right chest dialysis catheter with tip at the cavoatrial junction. Electronically signed by: Greig Pique MD 08/30/2024 07:38 PM EST RP Workstation: [id number]    sodium chloride    Intravenous Once   atorvastatin   40 mg Oral q morning   carvedilol   12.5 mg Oral BID WC   Chlorhexidine  Gluconate Cloth  6 each Topical Q0600   clopidogrel   75 mg Oral Daily   insulin  aspart  0-6 Units Subcutaneous TID WC   lamoTRIgine   100 mg Oral BID   pantoprazole   40 mg Oral BID AC   sertraline   100 mg Oral Daily    BMET    Component Value Date/Time   NA 133 (L) 09/01/2024 0408   K 5.0 09/01/2024 0408   CL 95 (L) 09/01/2024 0408   CO2 29 09/01/2024 0408   GLUCOSE 123 (H) 09/01/2024 0408   BUN 40 (H) 09/01/2024 0408   CREATININE 8.25 (H) 09/01/2024 0408   CALCIUM  8.7 (L) 09/01/2024 0408   GFRNONAA 5 (L) 09/01/2024 0408   GFRAA >60 05/28/2018 2119   CBC    Component Value Date/Time   WBC 6.4 09/01/2024 0408   RBC 2.07 (L) 09/01/2024 0408   HGB 6.3 (LL) 09/01/2024 0408   HCT 19.8 (L) 09/01/2024 0408   PLT 189 09/01/2024 0408   MCV 95.7 09/01/2024 0408   MCH 30.4 09/01/2024 0408    MCHC 31.8 09/01/2024 0408   RDW 16.6 (H) 09/01/2024 0408   LYMPHSABS 1.2 08/30/2024 2004   MONOABS 0.4 08/30/2024 2004   EOSABS 0.2 08/30/2024 2004   BASOSABS 0.0 08/30/2024 2004    Dialysis Orders: Center:  [facility]  on TTS . EDW 100 kg HD Bath 2K/2.5Ca  Time 4:00 Heparin  none. Access RIJ TDC BFR 350 DFR 600        Assessment/Plan:  Hyperkalemia - due to missed HD.  Improved with Lokelma .  Signed off early yesterday and will plan for a short session of HD today, then get back on TTS schedule tomorrow.   Neck pain - has an ulceration and erythema at the cath exit site but no fluctuance or drainage.  Likely due to trauma of RIJ TDC.  No evidence of infection and will clean site with HD today.  ESRD -  continue with HD on TTS schedule  Hypertension/volume  - stable  Acute on chronic anemia  - plan for blood transfusion  again today.  GI workup per primary svc.  She has required blood transfusions over the past few weeks.  Unclear source of blood loss.  May need Hematology consultation if GI workup negative.  Had negative EGD on 06/30/24 but did not appear to have had a colonoscopy recently.  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  Plan per primary service. CAD - h/o STEMI with PCI on plavix , statin, and coreg .  Fairy RONAL Sellar, MD [facility]

## 2024-09-01 NOTE — Hospital Course (Addendum)
 63 y.o. female with medical history significant of hypertension, hyperlipidemia, GAD with panic attacks, asthma, CAD, STEMI with PCI, prior DVT on Eliquis , Bipolar disorder, DM 2, neuropathy, ESRD on HD (TTS), atrial fibrillation who presents to the emergency department due to right neck pain around where dialysis catheter is located.  She also complained of intermittent chest pain that comes and goes and that she had similar chest pain several months ago.  She complains of recent cough with greenish phlegm, but denies fever, chills, vomiting, diarrhea.  She missed last dialysis on Saturday (2 days ago) due to pain and not feeling well.   Patient was admitted for hyperkalemia in the setting of missed hemodialysis and was also thought to have some bronchitis and/for community-acquired pneumonia, but procalcitonin is low and there are no significant chest x-ray findings noted.  She is now noted to have some worsening anemia requiring 1 unit PRBC transfusion.

## 2024-09-02 ENCOUNTER — Inpatient Hospital Stay (HOSPITAL_COMMUNITY)

## 2024-09-02 DIAGNOSIS — R1032 Left lower quadrant pain: Secondary | ICD-10-CM

## 2024-09-02 DIAGNOSIS — E875 Hyperkalemia: Secondary | ICD-10-CM | POA: Diagnosis not present

## 2024-09-02 DIAGNOSIS — D62 Acute posthemorrhagic anemia: Secondary | ICD-10-CM | POA: Diagnosis not present

## 2024-09-02 DIAGNOSIS — D649 Anemia, unspecified: Secondary | ICD-10-CM

## 2024-09-02 DIAGNOSIS — K654 Sclerosing mesenteritis: Secondary | ICD-10-CM

## 2024-09-02 LAB — CBC
HCT: 20.8 % — ABNORMAL LOW (ref 36.0–46.0)
Hemoglobin: 6.8 g/dL — CL (ref 12.0–15.0)
MCH: 30 pg (ref 26.0–34.0)
MCHC: 32.7 g/dL (ref 30.0–36.0)
MCV: 91.6 fL (ref 80.0–100.0)
Platelets: 170 K/uL (ref 150–400)
RBC: 2.27 MIL/uL — ABNORMAL LOW (ref 3.87–5.11)
RDW: 17.1 % — ABNORMAL HIGH (ref 11.5–15.5)
WBC: 7.4 K/uL (ref 4.0–10.5)
nRBC: 0 % (ref 0.0–0.2)

## 2024-09-02 LAB — RENAL FUNCTION PANEL
Albumin: 3 g/dL — ABNORMAL LOW (ref 3.5–5.0)
Anion gap: 10 (ref 5–15)
BUN: 24 mg/dL — ABNORMAL HIGH (ref 8–23)
CO2: 30 mmol/L (ref 22–32)
Calcium: 8.6 mg/dL — ABNORMAL LOW (ref 8.9–10.3)
Chloride: 93 mmol/L — ABNORMAL LOW (ref 98–111)
Creatinine, Ser: 5.76 mg/dL — ABNORMAL HIGH (ref 0.44–1.00)
GFR, Estimated: 8 mL/min — ABNORMAL LOW (ref >60)
Glucose, Bld: 124 mg/dL — ABNORMAL HIGH (ref 70–99)
Phosphorus: 3.8 mg/dL (ref 2.5–4.6)
Potassium: 4.3 mmol/L (ref 3.5–5.1)
Sodium: 133 mmol/L — ABNORMAL LOW (ref 135–145)

## 2024-09-02 LAB — IRON AND TIBC
Iron: 17 ug/dL — ABNORMAL LOW (ref 28–170)
Saturation Ratios: 10 % — ABNORMAL LOW (ref 10.4–31.8)
TIBC: 165 ug/dL — ABNORMAL LOW (ref 250–450)
UIBC: 148 ug/dL

## 2024-09-02 LAB — GLUCOSE, CAPILLARY
Glucose-Capillary: 119 mg/dL — ABNORMAL HIGH (ref 70–99)
Glucose-Capillary: 111 mg/dL — ABNORMAL HIGH (ref 70–99)
Glucose-Capillary: 122 mg/dL — ABNORMAL HIGH (ref 70–99)
Glucose-Capillary: 150 mg/dL — ABNORMAL HIGH (ref 70–99)

## 2024-09-02 LAB — HEMOGLOBIN AND HEMATOCRIT, BLOOD
HCT: 25.3 % — ABNORMAL LOW (ref 36.0–46.0)
Hemoglobin: 8.2 g/dL — ABNORMAL LOW (ref 12.0–15.0)

## 2024-09-02 LAB — PREPARE RBC (CROSSMATCH)

## 2024-09-02 MED ORDER — SODIUM CHLORIDE 0.9% IV SOLUTION: Freq: Once | INTRAVENOUS | Status: AC

## 2024-09-02 MED ORDER — DIPHENHYDRAMINE HCL 50 MG/ML IJ SOLN
12.5000 mg | Freq: Once | INTRAMUSCULAR | Status: AC
  Administered 2024-09-02: 12:00:00 12.5 mg via INTRAVENOUS
  Filled 2024-09-02: qty 1

## 2024-09-02 NOTE — Progress Notes (Signed)
 Patient ID: [name], female   DOB: 06/03/1961, 63 y.o.   MRN: [id number] S: tolerated full session of HD yesterday.  Still feels weak. O:BP 116/61 (BP Location: Left Wrist)   Pulse 81   Temp 98.5 F (36.9 C) (Oral)   Resp 18   Ht 5' 3 (1.6 m)   Wt 97 kg   SpO2 94%   BMI 37.88 kg/m   Intake/Output Summary (Last 24 hours) at 09/02/2024 0857 Last data filed at 09/02/2024 0548 Gross per 24 hour  Intake 240 ml  Output 1350 ml  Net -1110 ml   Intake/Output: I/O last 3 completed shifts: In: 480 [P.O.:480] Out: 1350 [Urine:350; Other:1000]  Intake/Output this shift:  No intake/output data recorded. Weight change: 0.6 kg Gen: NAD CVS: RRR Resp:CTA Abd: +BS, soft, NT/ND Ext: no edema  Recent Labs  Lab 08/30/24 2004 08/31/24 0501 09/01/24 0408 09/02/24 0405  NA 133* 135 133* 133*  K 5.7* 5.6* 5.0 4.3  CL 96* 97* 95* 93*  CO2 20* 28 29 30   GLUCOSE 120* 81 123* 124*  BUN 43* 43* 40* 24*  CREATININE 9.19* 9.97* 8.25* 5.76*  ALBUMIN  3.5 3.3*  --  3.0*  CALCIUM  8.9 8.7* 8.7* 8.6*  PHOS  --  5.5*  --  3.8  AST 12* <10*  --   --   ALT 6 6  --   --    Liver Function Tests: Recent Labs  Lab 08/30/24 2004 08/31/24 0501 09/02/24 0405  AST 12* <10*  --   ALT 6 6  --   ALKPHOS 70 62  --   BILITOT 0.6 0.5  --   PROT 9.3* 8.5*  --   ALBUMIN  3.5 3.3* 3.0*   Recent Labs  Lab 08/30/24 2004  LIPASE 45   No results for input(s): AMMONIA in the last 168 hours. CBC: Recent Labs  Lab 08/30/24 2004 08/31/24 0501 08/31/24 0703 09/01/24 0408 09/02/24 0405  WBC 6.6 5.9  --  6.4 7.4  NEUTROABS 4.8  --   --   --   --   HGB 7.2* 6.2* 6.4* 6.3* 6.8*  HCT 22.4* 19.3* 20.1* 19.8* 20.8*  MCV 94.9 95.1  --  95.7 91.6  PLT 192 192  --  189 170   Cardiac Enzymes: No results for input(s): CKTOTAL, CKMB, CKMBINDEX, TROPONINI in the last 168 hours. CBG: Recent Labs  Lab 08/31/24 1937 09/01/24 0724 09/01/24 1113 09/01/24 1923 09/02/24 0722  GLUCAP 163* 120*  127* 121* 119*    Iron Studies:  Recent Labs    09/02/24 0405  IRON 17*  TIBC 165*   Studies/Results: No results found.  sodium chloride    Intravenous Once   sodium chloride    Intravenous Once   atorvastatin   40 mg Oral q morning   carvedilol   12.5 mg Oral BID WC   Chlorhexidine  Gluconate Cloth  6 each Topical Q0600   clopidogrel   75 mg Oral Daily   darbepoetin (ARANESP ) injection - DIALYSIS  40 mcg Subcutaneous Q Wed-1800   insulin  aspart  0-6 Units Subcutaneous TID WC   lamoTRIgine   100 mg Oral BID   pantoprazole   40 mg Oral BID AC   sertraline   100 mg Oral Daily    BMET    Component Value Date/Time   NA 133 (L) 09/02/2024 0405   K 4.3 09/02/2024 0405   CL 93 (L) 09/02/2024 0405   CO2 30 09/02/2024 0405   GLUCOSE 124 (H) 09/02/2024 0405   BUN  24 (H) 09/02/2024 0405   CREATININE 5.76 (H) 09/02/2024 0405   CALCIUM  8.6 (L) 09/02/2024 0405   GFRNONAA 8 (L) 09/02/2024 0405   GFRAA >60 05/28/2018 2119   CBC    Component Value Date/Time   WBC 7.4 09/02/2024 0405   RBC 2.27 (L) 09/02/2024 0405   HGB 6.8 (LL) 09/02/2024 0405   HCT 20.8 (L) 09/02/2024 0405   PLT 170 09/02/2024 0405   MCV 91.6 09/02/2024 0405   MCH 30.0 09/02/2024 0405   MCHC 32.7 09/02/2024 0405   RDW 17.1 (H) 09/02/2024 0405   LYMPHSABS 1.2 08/30/2024 2004   MONOABS 0.4 08/30/2024 2004   EOSABS 0.2 08/30/2024 2004   BASOSABS 0.0 08/30/2024 2004    Dialysis Orders: Center:  [facility]  on TTS . EDW 100 kg HD Bath 2K/2.5Ca  Time 4:00 Heparin  none. Access RIJ TDC BFR 350 DFR 600        Assessment/Plan:  Hyperkalemia - due to missed HD.  Improved with Lokelma .  Signed off early on Tuesday but tolerated full session yesterday with resolution of hyperkalemia today.   Neck pain - has an ulceration and erythema at the cath exit site but no fluctuance or drainage.  Likely due to trauma of RIJ TDC.  No evidence of infection and will clean site with HD today.  ESRD -  outpatient HD on TTS schedule,  however off schedule here due to s/o early on Tuesday and had HD yesterday.  Plan for HD tomorrow and eventually get back on outpatient schedule.  Hypertension/volume  - stable  Acute on chronic anemia  - plan for blood transfusion again today.  GI workup per primary svc.  She has required blood transfusions over the past few weeks.  Unclear source of blood loss.  May need Hematology consultation if GI workup negative.  Had negative EGD on 06/30/24 but did not appear to have had a colonoscopy recently.  Will dose with ESA and check iron stores.  Hgb still low at 6.8 and will require more blood transfusions.  To have colonoscopy as an outpatient.    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  Plan per primary service. CAD - h/o STEMI with PCI on plavix , statin, and coreg .    Fairy RONAL Sellar, MD [facility]

## 2024-09-02 NOTE — Progress Notes (Signed)
 PROGRESS NOTE   [name]  FMW:[id number] DOB: 30-Dec-1960 DOA: 08/30/2024 PCP: System, Provider Not In   Chief Complaint  Patient presents with   Vascular Access Problem   Level of care: Telemetry  Brief Admission History:  63 y.o. female with medical history significant of hypertension, hyperlipidemia, GAD with panic attacks, asthma, CAD, STEMI with PCI, prior DVT on Eliquis , Bipolar disorder, DM 2, neuropathy, ESRD on HD (TTS), atrial fibrillation who presents to the emergency department due to right neck pain around where dialysis catheter is located.  She also complained of intermittent chest pain that comes and goes and that she had similar chest pain several months ago.  She complains of recent cough with greenish phlegm, but denies fever, chills, vomiting, diarrhea.  She missed last dialysis on Saturday (2 days ago) due to pain and not feeling well.   Patient was admitted for hyperkalemia in the setting of missed hemodialysis and was also thought to have some bronchitis and/for community-acquired pneumonia, but procalcitonin is low and there are no significant chest x-ray findings noted.  She is now noted to have some worsening anemia requiring 1 unit PRBC transfusion.     Assessment and Plan:  Hyperkalemia -- treated and resolved  Nephrology plans for more hemodialysis today and back on schedule tomorrow   ESRD on HD (TTS) Patient missed dialysis on Saturday (12/13) Nephrologist consulted by EDP and will arrange for patient's dialysis   Viral CAP POA CT suggestive of pneumonia, patient without any symptoms of pneumonia other than productive cough of green phlegm. Procalcitonin currently low and no significant leukocytosis Holding antibiotics and continue to monitor Treating supportively as a viral pneumonia   Acute on chronic anemia  Hemoglobin was 7.2 on admission, dropped to 6.2, repeat hemoglobin 6.8 1 unit PRBC transfusion given 12/17 and 12/18 Recheck CBC in AM  and if needed, transfuse further with HD tomorrow  EGD in Oct 2025 was reassuring but no recent colonoscopy GI recommending outpatient colonoscopy    History of CAD/STEMI with PCI Continue Plavix , statin, Coreg   Left wrist pain/stiffness -- she is left hand dominant and symptoms concerning for carpal tunnel strain -- pt agreeable to trial for a left wrist splint to wear nightly to relieve carpal tunnel   Type 2 diabetes mellitus, controlled, with renal complications Hemoglobin A1c on 05/06/2024 was 6.5.  Repeat A1c 5.3%  Continue ISS and hypoglycemia protocol  CBG (last 3)  Recent Labs    09/01/24 1923 09/02/24 0722 09/02/24 1114  GLUCAP 121* 119* 111*    GAD/Bipolar Continue lamictal , sertraline , klonopin    History of GI bleed Continue Protonix    Obesity, class II BMI 38.04  Cough and URI symptoms - obtain CXR portable   DVT prophylaxis: SCDs Code Status: FULL  Family Communication:  Disposition: home tomorrow after HD if remains stable    Consultants:  Nephrology GI  Procedures:   Antimicrobials:    Subjective: Pt reporting postnasal drainage, cough and chest congestion.   Objective: Vitals:   09/02/24 0745 09/02/24 1113 09/02/24 1130 09/02/24 1200  BP: 116/61 (!) 123/59 (!) 118/52 111/66  Pulse: 81 80 78 74  Resp: 18  18 18   Temp: 98.5 F (36.9 C) 98.8 F (37.1 C) 98.9 F (37.2 C) 98.9 F (37.2 C)  TempSrc: Oral Oral Oral   SpO2: 94% 91%    Weight:      Height:        Intake/Output Summary (Last 24 hours) at 09/02/2024 1237 Last data filed  at 09/02/2024 1200 Gross per 24 hour  Intake 270 ml  Output 1350 ml  Net -1080 ml   Filed Weights   08/31/24 1358 09/01/24 1551 09/01/24 1858  Weight: 96.4 kg 98 kg 97 kg   Examination:  General exam: Appears calm and comfortable  Respiratory system: no increased work of breathing.  Cardiovascular system: normal S1 & S2 heard. No JVD, murmurs, rubs, gallops or clicks. No pedal  edema. Gastrointestinal system: Abdomen is nondistended, soft and nontender. No organomegaly or masses felt. Normal bowel sounds heard. Central nervous system: Alert and oriented. No focal neurological deficits. Extremities: Symmetric 5 x 5 power. Skin: No rashes, lesions or ulcers. Psychiatry: Judgement and insight appear normal. Mood & affect appropriate.   Data Reviewed: I have personally reviewed following labs and imaging studies  CBC: Recent Labs  Lab 08/30/24 2004 08/31/24 0501 08/31/24 0703 09/01/24 0408 09/02/24 0405  WBC 6.6 5.9  --  6.4 7.4  NEUTROABS 4.8  --   --   --   --   HGB 7.2* 6.2* 6.4* 6.3* 6.8*  HCT 22.4* 19.3* 20.1* 19.8* 20.8*  MCV 94.9 95.1  --  95.7 91.6  PLT 192 192  --  189 170    Basic Metabolic Panel: Recent Labs  Lab 08/30/24 2004 08/31/24 0501 09/01/24 0408 09/02/24 0405  NA 133* 135 133* 133*  K 5.7* 5.6* 5.0 4.3  CL 96* 97* 95* 93*  CO2 20* 28 29 30   GLUCOSE 120* 81 123* 124*  BUN 43* 43* 40* 24*  CREATININE 9.19* 9.97* 8.25* 5.76*  CALCIUM  8.9 8.7* 8.7* 8.6*  MG  --  1.9 1.9  --   PHOS  --  5.5*  --  3.8    CBG: Recent Labs  Lab 09/01/24 0724 09/01/24 1113 09/01/24 1923 09/02/24 0722 09/02/24 1114  GLUCAP 120* 127* 121* 119* 111*    Recent Results (from the past 240 hours)  MRSA Next Gen by PCR, Nasal     Status: None   Collection Time: 08/31/24  2:33 AM   Specimen: Nasal Mucosa; Nasal Swab  Result Value Ref Range Status   MRSA by PCR Next Gen NOT DETECTED NOT DETECTED Final    Comment: (NOTE) The GeneXpert MRSA Assay (FDA approved for NASAL specimens only), is one component of a comprehensive MRSA colonization surveillance program. It is not intended to diagnose MRSA infection nor to guide or monitor treatment for MRSA infections. Test performance is not FDA approved in patients less than 17 years old. Performed at [facility], [address], [place], [place] [address]      Radiology Studies: No results  found.   Scheduled Meds:  atorvastatin   40 mg Oral q morning   carvedilol   12.5 mg Oral BID WC   Chlorhexidine  Gluconate Cloth  6 each Topical Q0600   clopidogrel   75 mg Oral Daily   darbepoetin (ARANESP ) injection - DIALYSIS  40 mcg Subcutaneous Q Wed-1800   insulin  aspart  0-6 Units Subcutaneous TID WC   lamoTRIgine   100 mg Oral BID   pantoprazole   40 mg Oral BID AC   sertraline   100 mg Oral Daily   Continuous Infusions:   LOS: 2 days   Time spent: 55 mins  Ingra Rother Vicci, MD How to contact the [facility] Attending or Consulting provider 7A - 7P or covering provider during after hours 7P -7A, for this patient?  Check the care team in [facility] and look for a) attending/consulting TR[facility] provider listed and b)  the TRH team listed Log into [email or web address] to find provider on call.  Locate the TRH provider you are looking for under [facility] and page to a number that you can be directly reached. If you still have difficulty reaching the provider, please page the [facility] (Director on Call) for the Hospitalists listed on amion for assistance.  09/02/2024, 12:37 PM

## 2024-09-02 NOTE — Progress Notes (Signed)
 Patient Hemoglobin and Hematocrit was 6.8/20.8 this am,Dr Clanford Johnson notified.Plan of care on going.

## 2024-09-02 NOTE — Progress Notes (Signed)
 Nurse at bedside,patient receiving one unit of Packed Red blood cells this morning per Dr Ferdie orders. Patient pre medicated with Tylenol  650 mg's by mouth and Benadryl  12.5 mg's intravenous prior to blood transfusion per MD's orders.Plan of care on going.

## 2024-09-02 NOTE — Progress Notes (Addendum)
 Gastroenterology Progress Note   Referring Provider: No ref. provider found Primary Care Physician:  System, Provider Not In Patient ID: [name]; [id number]; 05/18/1961   Subjective:    Feels ok. Had coughing spell and eventually had emesis. Feels nauseated too. Did not feel like eating today. Has LLQ pain for two days. No BM this admission. She notes that she had blood transfusion last week in [place]. She received one unit of blood yesterday and plans for one today.   Objective:   Vital signs in last 24 hours: Temp:  [98.5 F (36.9 C)-100.8 F (38.2 C)] 98.5 F (36.9 C) (12/18 0745) Pulse Rate:  [80-91] 81 (12/18 0745) Resp:  [16-20] 18 (12/18 0745) BP: (89-133)/(44-69) 116/61 (12/18 0745) SpO2:  [92 %-98 %] 94 % (12/18 0745) Weight:  [97 kg-98 kg] 97 kg (12/17 1858)   General:   Alert,  Well-developed, well-nourished, pleasant and cooperative in NAD Head:  Normocephalic and atraumatic. Eyes:  Sclera clear, no icterus.   Abdomen:  Soft, nondistended. No masses, hepatosplenomegaly or hernias noted. Normal bowel sounds, without guarding, and without rebound.  Minimal llq tenderness Extremities:  Without clubbing, deformity or edema. Neurologic:  Alert and  oriented x4;  grossly normal neurologically. Skin:  Intact without significant lesions or rashes. Psych:  Alert and cooperative. Normal mood and affect.  Intake/Output from previous day: 12/17 0701 - 12/18 0700 In: 240 [P.O.:240] Out: 1350 [Urine:350] Intake/Output this shift: No intake/output data recorded.  Lab Results: CBC Recent Labs    08/31/24 0501 08/31/24 0703 09/01/24 0408 09/02/24 0405  WBC 5.9  --  6.4 7.4  HGB 6.2* 6.4* 6.3* 6.8*  HCT 19.3* 20.1* 19.8* 20.8*  MCV 95.1  --  95.7 91.6  PLT 192  --  189 170   BMET Recent Labs    08/31/24 0501 09/01/24 0408 09/02/24 0405  NA 135 133* 133*  K 5.6* 5.0 4.3  CL 97* 95* 93*  CO2 28 29 30   GLUCOSE 81 123* 124*  BUN 43* 40* 24*   CREATININE 9.97* 8.25* 5.76*  CALCIUM  8.7* 8.7* 8.6*   LFTs Recent Labs    08/30/24 2004 08/31/24 0501 09/02/24 0405  BILITOT 0.6 0.5  --   ALKPHOS 70 62  --   AST 12* <10*  --   ALT 6 6  --   PROT 9.3* 8.5*  --   ALBUMIN  3.5 3.3* 3.0*   Recent Labs    08/30/24 2004  LIPASE 45   PT/INR No results for input(s): LABPROT, INR in the last 72 hours.       Imaging Studies: CT ABDOMEN PELVIS WO CONTRAST Result Date: 08/30/2024 EXAM: CT ABDOMEN AND PELVIS WITHOUT CONTRAST 08/30/2024 08:42:14 PM TECHNIQUE: CT of the abdomen and pelvis was performed without the administration of intravenous contrast. Multiplanar reformatted images are provided for review. Automated exposure control, iterative reconstruction, and/or weight-based adjustment of the mA/kV was utilized to reduce the radiation dose to as low as reasonably achievable. COMPARISON: 06/26/2024 CLINICAL HISTORY: Abdominal pain, acute, nonlocalized. FINDINGS: LOWER CHEST: Patchy left lower lobe opacity, suspicious for pneumonia. Moderate 3-vessel coronary atherosclerosis. LIVER: The liver is unremarkable. GALLBLADDER AND BILE DUCTS: Status post cholecystectomy. No biliary ductal dilatation. SPLEEN: No acute abnormality. PANCREAS: No acute abnormality. ADRENAL GLANDS: No acute abnormality. KIDNEYS, URETERS AND BLADDER: Punctate nonobstructing right lower pole renal calculus. No stones in the ureters. No hydronephrosis. No perinephric or periureteral stranding. Urinary bladder is unremarkable. GI AND BOWEL: Stomach demonstrates no acute abnormality.  Status post appendectomy. There is no bowel obstruction. PERITONEUM AND RETROPERITONEUM: Mesenteric stranding along the anterior mid abdomen (image 52), progressive from recent priors, new from 2024. This appearance favors sclerosing mesenteritis. No ascites. No free air. VASCULATURE: Atherosclerotic calcifications of the abdominal aorta and branch vessels. LYMPH NODES: No discrete  lymphadenopathy. REPRODUCTIVE ORGANS: Status post hysterectomy. BONES AND SOFT TISSUES: No acute osseous abnormality. No focal soft tissue abnormality. IMPRESSION: 1. 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 2. Otherwise, no acute findings in the abdomen/pelvis. 3. Patchy left lower lobe opacity, suspicious for pneumonia. Electronically signed by: Pinkie Pebbles MD 08/30/2024 09:07 PM EST RP Workstation: [id number]   DG Chest Portable 1 View Result Date: 08/30/2024 EXAM: 1 VIEW(S) XRAY OF THE CHEST 08/30/2024 07:29:00 PM COMPARISON: 07/01/2024 CLINICAL HISTORY: chest pain FINDINGS: LINES, TUBES AND DEVICES: Stable right chest dialysis catheter with distal tip ending at the cavoatrial junction. LUNGS AND PLEURA: Stable chronically low lung volumes. No focal pulmonary opacity. No pleural effusion. No pneumothorax. HEART AND MEDIASTINUM: The distal tip of the right chest dialysis catheter is at the cavoatrial junction. No acute abnormality of the cardiac and mediastinal silhouettes. BONES AND SOFT TISSUES: No acute osseous abnormality. IMPRESSION: 1. No acute cardiopulmonary findings. 2. Stable right chest dialysis catheter with tip at the cavoatrial junction. Electronically signed by: Greig Pique MD 08/30/2024 07:38 PM EST RP Workstation: [id number]  [2 weeks]  Assessment:    [name] is a 63 y.o. year old female with multiple co-morbidities to include ESRD on dialysis, on Eliquis  and plavix , anemia of chronic disease with IDA requiring multiple blood transfusions and iron infusions over the last year with EGD in October with PHG, who presented to the ED 12/15 with c/o neck pain near dialysis catheter as well as productive cough, reportedly missing dialysis on Saturday. Admitted for hyperkalemia, thought to have bronchitis/CAP, with worsening anemia. GI consulted for anemia    Acute on chronic anemia: -Hgb 7.2 on admission, down to 6.2  with last result of 6.3 yesterday morning -1 unit PRBCs given yesterday -Hgb 6.8 today. 1 unit or PRBCs planned -Iron 17, TIBC 165, iron sat 10  -B12 172 (04/2024), 384 currently -folate 6.5 -EGD in October with PHG -last TCS in 2014 -recommended to follow up with GI as outpatient for likely colonoscopy but lost to follow up -no rectal bleeding or melena.    Case discussed with Dr. Cindie, given anemia is chronic and likely multifactorial, would optimize her hemoglobin with transfusions and plan for outpatient colonoscopy unless she has overt GI bleeding. Will check B12 and folate as Iron studies in October were WNL.   LLQ pain: -reports pain for two days, exam benign, ?related to constipation -Progressive mesenteric stranding along the anterior mid abdomen, favoring sclerosing mesenteritis noted on CT this admission could be contributing     Plan:   PPI BID Trend H/H Monitor for overt GI bleeding Miralax  17g bid until soft stool, then daily as needed. Outpatient colonoscopy unless overt bleeding noted on downward trend in Hgb    LOS: 2 days   Sonny RAMAN. Ezzard RIGGERS [facility] [phone] 12/18/20259:22 AM

## 2024-09-02 NOTE — Progress Notes (Signed)
°   09/02/24 1435  Vitals  Temp 99.1 F (37.3 C)  Temp Source Oral  BP (!) 99/50  MAP (mmHg) (!) 64  BP Location Left Arm  BP Method Automatic  Patient Position (if appropriate) Lying  Pulse Rate 68  Pulse Rate Source Dinamap  MEWS COLOR  MEWS Score Color Green  Oxygen  Therapy  SpO2 94 %  O2 Device Room Air  MEWS Score  MEWS Temp 0  MEWS Systolic 1  MEWS Pulse 0  MEWS RR 0  MEWS LOC 0  MEWS Score 1  Provider Notification  Provider Name/Title Dr Vicci  Date Provider Notified 09/02/24  Time Provider Notified 1435  Method of Notification Page  Notification Reason Critical Result (b/p 99/50)  Test performed and critical result vital signs  Date Critical Result Received 09/02/24  Time Critical Result Received 1435  Provider response See new orders  Date of Provider Response 09/02/24  Time of Provider Response 1454

## 2024-09-02 NOTE — Plan of Care (Signed)

## 2024-09-03 ENCOUNTER — Telehealth: Payer: Self-pay | Admitting: Gastroenterology

## 2024-09-03 DIAGNOSIS — K5900 Constipation, unspecified: Secondary | ICD-10-CM

## 2024-09-03 DIAGNOSIS — D649 Anemia, unspecified: Secondary | ICD-10-CM

## 2024-09-03 DIAGNOSIS — Z992 Dependence on renal dialysis: Secondary | ICD-10-CM

## 2024-09-03 DIAGNOSIS — R1032 Left lower quadrant pain: Secondary | ICD-10-CM

## 2024-09-03 DIAGNOSIS — R935 Abnormal findings on diagnostic imaging of other abdominal regions, including retroperitoneum: Secondary | ICD-10-CM

## 2024-09-03 DIAGNOSIS — N186 End stage renal disease: Secondary | ICD-10-CM

## 2024-09-03 DIAGNOSIS — D509 Iron deficiency anemia, unspecified: Secondary | ICD-10-CM

## 2024-09-03 LAB — BPAM RBC
Blood Product Expiration Date: 202601042359
ISSUE DATE / TIME: 202512170828
ISSUE DATE / TIME: 202512181416
ISSUE DATE / TIME: 202601042359
Unit Type and Rh: 202601042359
Unit Type and Rh: 202601042359
Unit Type and Rh: 5100
Unit Type and Rh: 5100

## 2024-09-03 LAB — TYPE AND SCREEN
ABO/RH(D): O POS
Antibody Screen: POSITIVE
DAT, IgG: NEGATIVE
Donor AG Type: NEGATIVE
Donor AG Type: NEGATIVE
Unit division: 0
Unit division: 0

## 2024-09-03 LAB — RENAL FUNCTION PANEL
Albumin: 3 g/dL — ABNORMAL LOW (ref 3.5–5.0)
Anion gap: 12 (ref 5–15)
BUN: 36 mg/dL — ABNORMAL HIGH (ref 8–23)
CO2: 28 mmol/L (ref 22–32)
Calcium: 8.9 mg/dL (ref 8.9–10.3)
Chloride: 92 mmol/L — ABNORMAL LOW (ref 98–111)
Creatinine, Ser: 7.64 mg/dL — ABNORMAL HIGH (ref 0.44–1.00)
GFR, Estimated: 6 mL/min — ABNORMAL LOW
Glucose, Bld: 116 mg/dL — ABNORMAL HIGH (ref 70–99)
Phosphorus: 5.5 mg/dL — ABNORMAL HIGH (ref 2.5–4.6)
Potassium: 4.6 mmol/L (ref 3.5–5.1)
Sodium: 132 mmol/L — ABNORMAL LOW (ref 135–145)

## 2024-09-03 LAB — GLUCOSE, CAPILLARY: Glucose-Capillary: 115 mg/dL — ABNORMAL HIGH (ref 70–99)

## 2024-09-03 LAB — CBC
HCT: 23.5 % — ABNORMAL LOW (ref 36.0–46.0)
Hemoglobin: 7.7 g/dL — ABNORMAL LOW (ref 12.0–15.0)
MCH: 30 pg (ref 26.0–34.0)
MCHC: 32.8 g/dL (ref 30.0–36.0)
MCV: 91.4 fL (ref 80.0–100.0)
Platelets: 172 K/uL (ref 150–400)
RBC: 2.57 MIL/uL — ABNORMAL LOW (ref 3.87–5.11)
RDW: 17.1 % — ABNORMAL HIGH (ref 11.5–15.5)
WBC: 7.5 K/uL (ref 4.0–10.5)
nRBC: 0 % (ref 0.0–0.2)

## 2024-09-03 MED ORDER — LEVOFLOXACIN 250 MG PO TABS: 250.0000 mg | ORAL_TABLET | ORAL | 0 refills | Status: AC

## 2024-09-03 MED ORDER — LEVOFLOXACIN 500 MG PO TABS: 250.0000 mg | ORAL_TABLET | ORAL | Status: DC

## 2024-09-03 MED ORDER — HEPARIN SODIUM (PORCINE) 1000 UNIT/ML IJ SOLN
INTRAMUSCULAR | Status: AC
  Filled 2024-09-03: qty 4

## 2024-09-03 MED ORDER — LEVOFLOXACIN 500 MG PO TABS
500.0000 mg | ORAL_TABLET | Freq: Once | ORAL | Status: AC
  Administered 2024-09-03: 500 mg via ORAL
  Filled 2024-09-03: qty 1

## 2024-09-03 MED ORDER — IRON (FERROUS SULFATE) 325 (65 FE) MG PO TABS: 1.0000 | ORAL_TABLET | Freq: Every day | ORAL | 1 refills | Status: AC

## 2024-09-03 MED ORDER — ONDANSETRON HCL 4 MG/2ML IJ SOLN
INTRAMUSCULAR | Status: AC
  Filled 2024-09-03: qty 2

## 2024-09-03 NOTE — Progress Notes (Signed)
 Pt completed tx with no issues. Pt's R internal jugular was used for tx.  Blue and Red lumen filled with 1.6 u of heparin  at end of tx. Zofran  was given d/t pt c/o nausea.  09/03/24 1257  Vitals  Temp 99.4 F (37.4 C)  Pulse Rate 78  Resp (!) 21  BP (!) 129/57  SpO2 94 %  O2 Device Room Air  Weight 95.8 kg  Oxygen  Therapy  Oximetry Probe Site Changed No  Post Treatment  Dialyzer Clearance Lightly streaked  Hemodialysis Intake (mL) 0 mL  Liters Processed 84  Fluid Removed (mL) 1000 mL  Tolerated HD Treatment Yes

## 2024-09-03 NOTE — Procedures (Signed)
 I was present at this dialysis session. I have reviewed the session itself and made appropriate changes. She is off of schedule, however holiday schedule will plan for next HD session to be on 09/06/24 and hopeful outpatient schedule on 09/08/24 then back on TTS on 09/11/24.  Vital signs in last 24 hours:  Temp:  [98.8 F (37.1 C)-99.3 F (37.4 C)] 99.2 F (37.3 C) (12/19 0909) Pulse Rate:  [68-80] 68 (12/19 0930) Resp:  [12-18] 14 (12/19 0930) BP: (94-129)/(50-66) 126/62 (12/19 0930) SpO2:  [91 %-94 %] 94 % (12/19 0930) Weight:  [96.8 kg] 96.8 kg (12/19 0909) Weight change:  Filed Weights   09/01/24 1551 09/01/24 1858 09/03/24 0909  Weight: 98 kg 97 kg 96.8 kg    Recent Labs  Lab 09/03/24 0409  NA 132*  K 4.6  CL 92*  CO2 28  GLUCOSE 116*  BUN 36*  CREATININE 7.64*  CALCIUM  8.9  PHOS 5.5*    Recent Labs  Lab 08/30/24 2004 08/31/24 0501 09/01/24 0408 09/02/24 0405 09/02/24 1902 09/03/24 0409  WBC 6.6   < > 6.4 7.4  --  7.5  NEUTROABS 4.8  --   --   --   --   --   HGB 7.2*   < > 6.3* 6.8* 8.2* 7.7*  HCT 22.4*   < > 19.8* 20.8* 25.3* 23.5*  MCV 94.9   < > 95.7 91.6  --  91.4  PLT 192   < > 189 170  --  172   < > = values in this interval not displayed.    Scheduled Meds:  atorvastatin   40 mg Oral q morning   carvedilol   12.5 mg Oral BID WC   Chlorhexidine  Gluconate Cloth  6 each Topical Q0600   clopidogrel   75 mg Oral Daily   darbepoetin (ARANESP ) injection - DIALYSIS  40 mcg Subcutaneous Q Wed-1800   insulin  aspart  0-6 Units Subcutaneous TID WC   lamoTRIgine   100 mg Oral BID   levofloxacin   500 mg Oral Once   Followed by   [START ON 09/05/2024] levofloxacin   250 mg Oral Q48H   pantoprazole   40 mg Oral BID AC   sertraline   100 mg Oral Daily   Continuous Infusions: PRN Meds:.acetaminophen  **OR** acetaminophen , clonazePAM , fentaNYL  (SUBLIMAZE ) injection, ondansetron  **OR** ondansetron  (ZOFRAN ) IV, oxyCODONE    Holly DELENA Sellar,  MD 09/03/2024, 9:45  AM

## 2024-09-03 NOTE — Plan of Care (Signed)

## 2024-09-03 NOTE — Telephone Encounter (Signed)
 [name] -please arrange hospital follow-up to discuss colonoscopy with Dr. Cindie or any APP as she has never been seen in the outpatient setting only inpatient and she has been seen by every APP between her hospitalization in October and this month.  [name]/Dena/Tammy -would we need you please make sure that she has a hemoglobin/hematocrit performed within 1 week. Dx: anemia  Charmaine Melia, MSN, APRN, FNP-BC, AGACNP-BC [facility]

## 2024-09-03 NOTE — Progress Notes (Signed)
 "  Gastroenterology Progress Note   Referring Provider: No ref. provider found Primary Care Physician:  System, Provider Not In Primary Gastroenterologist:  Carlin POUR. Cindie, DO   Patient ID: [name]; [id number]; Jul 18, 1961    Subjective   Patient seen while in dialysis.  Reported some nausea that morning but otherwise no vomiting and denies any abdominal pain.  Had not yet had a bowel movement but did report passing gas.  She states that she does get regular iron injections at dialysis on a weekly basis.  We discussed inpatient versus outpatient colonoscopy and she states she is ready to go home today and given her hemoglobin is stable she has not had any rectal bleeding she is fine with outpatient colonoscopy.  Objective   Vital signs in last 24 hours Temp:  [98.8 F (37.1 C)-99.3 F (37.4 C)] 99.2 F (37.3 C) (12/19 0909) Pulse Rate:  [68-80] 70 (12/19 1015) Resp:  [11-18] 16 (12/19 1015) BP: (94-129)/(50-66) 127/52 (12/19 1015) SpO2:  [91 %-94 %] 94 % (12/19 1015) Weight:  [96.8 kg] 96.8 kg (12/19 0909)    Physical Exam General:   Alert and oriented, chronically ill-appearing. Head:  Normocephalic and atraumatic. Eyes:  No icterus, sclera clear. Conjuctiva pink.  Mouth:  Without lesions, mucosa pink.  Dry lips.  Abdomen:  Bowel sounds present, soft, non-tender, non-distended. No HSM or hernias noted. No rebound or guarding. No masses appreciated  Neurologic:  Alert and  oriented x4;  grossly normal neurologically. Psych:  Alert and cooperative. Normal mood and affect.  Intake/Output from previous day: 12/18 0701 - 12/19 0700 In: 455 [Blood:455] Out: 200 [Urine:200] Intake/Output this shift: No intake/output data recorded.  Lab Results  Recent Labs    09/01/24 0408 09/02/24 0405 09/02/24 1902 09/03/24 0409  WBC 6.4 7.4  --  7.5  HGB 6.3* 6.8* 8.2* 7.7*  HCT 19.8* 20.8* 25.3* 23.5*  PLT 189 170  --  172   BMET Recent Labs    09/01/24 0408  09/02/24 0405 09/03/24 0409  NA 133* 133* 132*  K 5.0 4.3 4.6  CL 95* 93* 92*  CO2 29 30 28   GLUCOSE 123* 124* 116*  BUN 40* 24* 36*  CREATININE 8.25* 5.76* 7.64*  CALCIUM  8.7* 8.6* 8.9   LFT Recent Labs    09/02/24 0405 09/03/24 0409  ALBUMIN  3.0* 3.0*   PT/INR No results for input(s): LABPROT, INR in the last 72 hours. Hepatitis Panel No results for input(s): HEPBSAG, HCVAB, HEPAIGM, HEPBIGM in the last 72 hours.  Studies/Results DG CHEST PORT 1 VIEW Result Date: 09/02/2024 EXAM: 1 VIEW(S) XRAY OF THE CHEST 09/02/2024 02:34:00 PM COMPARISON: 08/30/2024 CLINICAL HISTORY: Chest congestion; Cough FINDINGS: LINES, TUBES AND DEVICES: Stable right chest wall dialysis catheter. LUNGS AND PLEURA: New left basilar airspace opacity. Opacities in the right upper lobe medially. No pleural effusion. No pneumothorax. HEART AND MEDIASTINUM: No acute abnormality of the cardiac and mediastinal silhouettes. BONES AND SOFT TISSUES: No acute osseous abnormality. IMPRESSION: 1. New left basilar and medial right upper lobe airspace opacities worrisome for multifocal pneumonia. Recommend follow-up chest x-ray in 4 to 6 weeks to confirm resolution . 2. Stable right chest wall dialysis catheter. Electronically signed by: Greig Pique MD 09/02/2024 07:55 PM EST RP Workstation: [id number]   CT ABDOMEN PELVIS WO CONTRAST Result Date: 08/30/2024 EXAM: CT ABDOMEN AND PELVIS WITHOUT CONTRAST 08/30/2024 08:42:14 PM TECHNIQUE: CT of the abdomen and pelvis was performed without the administration of intravenous contrast. Multiplanar reformatted images are  provided for review. Automated exposure control, iterative reconstruction, and/or weight-based adjustment of the mA/kV was utilized to reduce the radiation dose to as low as reasonably achievable. COMPARISON: 06/26/2024 CLINICAL HISTORY: Abdominal pain, acute, nonlocalized. FINDINGS: LOWER CHEST: Patchy left lower lobe opacity, suspicious for pneumonia.  Moderate 3-vessel coronary atherosclerosis. LIVER: The liver is unremarkable. GALLBLADDER AND BILE DUCTS: Status post cholecystectomy. No biliary ductal dilatation. SPLEEN: No acute abnormality. PANCREAS: No acute abnormality. ADRENAL GLANDS: No acute abnormality. KIDNEYS, URETERS AND BLADDER: Punctate nonobstructing right lower pole renal calculus. No stones in the ureters. No hydronephrosis. No perinephric or periureteral stranding. Urinary bladder is unremarkable. GI AND BOWEL: Stomach demonstrates no acute abnormality. Status post appendectomy. There is no bowel obstruction. PERITONEUM AND RETROPERITONEUM: Mesenteric stranding along the anterior mid abdomen (image 52), progressive from recent priors, new from 2024. This appearance favors sclerosing mesenteritis. No ascites. No free air. VASCULATURE: Atherosclerotic calcifications of the abdominal aorta and branch vessels. LYMPH NODES: No discrete lymphadenopathy. REPRODUCTIVE ORGANS: Status post hysterectomy. BONES AND SOFT TISSUES: No acute osseous abnormality. No focal soft tissue abnormality. IMPRESSION: 1. 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 2. Otherwise, no acute findings in the abdomen/pelvis. 3. Patchy left lower lobe opacity, suspicious for pneumonia. Electronically signed by: Pinkie Pebbles MD 08/30/2024 09:07 PM EST RP Workstation: [id number]   DG Chest Portable 1 View Result Date: 08/30/2024 EXAM: 1 VIEW(S) XRAY OF THE CHEST 08/30/2024 07:29:00 PM COMPARISON: 07/01/2024 CLINICAL HISTORY: chest pain FINDINGS: LINES, TUBES AND DEVICES: Stable right chest dialysis catheter with distal tip ending at the cavoatrial junction. LUNGS AND PLEURA: Stable chronically low lung volumes. No focal pulmonary opacity. No pleural effusion. No pneumothorax. HEART AND MEDIASTINUM: The distal tip of the right chest dialysis catheter is at the cavoatrial junction. No acute abnormality of the cardiac and  mediastinal silhouettes. BONES AND SOFT TISSUES: No acute osseous abnormality. IMPRESSION: 1. No acute cardiopulmonary findings. 2. Stable right chest dialysis catheter with tip at the cavoatrial junction. Electronically signed by: Greig Pique MD 08/30/2024 07:38 PM EST RP Workstation: [id number]    Assessment  63 y.o. female with a history of IBS, HTN, HLD, GERD, DVT, diabetes, anxiety, gastroparesis, A-fib on Plavix  and Eliquis  ESRD on dialysis TTS, anemia of chronic disease with IDA and has required multiple blood transfusions and iron  infusions over the last year with prior EGD in October noting portal hypertensive gastropathy who presented to the ED 12/15 with neck pain near her dialysis catheter, productive cough, and recently missed dialysis.  She was noted to be hyperkalemic on admission and suspected bronchitis/CAP and noted worsening anemia for which GI was consulted for further evaluation.  Acute on chronic anemia: - anemia likely multifactorial and secondary to ESRD - EGD in October that noted portal hypertensive gastropathy - Hgb 7.2 on admission and down trended to 6.2 on 12/16 and remained in 6 range including 6.8 yesterday. - Has only received 1u PRBC this admission (yesterday 12/18) with response to 8.2 and stable at 7.7 today.  - Iron  panel this admission with iron  sat 10%, iron  17, and TIBC 165. B12 and folate this admission. - Recommend ongoing iron  infusions/injections with dialysis sessions as deemed appropriate by nephrology. - Needs close outpatient monitoring of hemoglobin  LLQ pain, abnormal CT abdomen: - CT this admission noting progressive mesenteric stranding along the anterior mid abdomen favoring sclerosing mesenteritis  - has had constipation as well which could be contributing factor.  - In regards to her constipation I recommended that  she will need to continue on MiraLAX  twice daily until she starts having more regular bowel movements and if she starts having  significant months of diarrhea that the frequency of MiraLAX  can be decreased.  Plan / Recommendations  PPI BID Trend H/H, needs recheck in 1 week postdischarge.  Can be done at dialysis as long as labs faxed to us . Miralax  BID until stools are consistently soft then reduce to daily.  Monitor for overt GI bleeding Outpatient colonoscopy as long as no overt bleeding inpatient. If she requires additional transfusion while inpatient then may need to consider inpatient colonoscopy.  Consider IV iron load inpatient     LOS: 3 days   09/03/2024, 10:27 AM   Charmaine Melia, MSN, FNP-BC, AGACNP-BC [facility]   "

## 2024-09-03 NOTE — Discharge Summary (Addendum)
 Physician Discharge Summary  Dae Antonucci FMW:[id number] DOB: 04-20-1961 DOA: 08/30/2024    Admit date: 08/30/2024 Discharge date: 09/03/2024  Admitted From: Disposition:   Recommendations for Outpatient Follow-up:  Follow up with PCP in 1 weeks Go to outpatient HD on Monday 12/22 per holiday scheduling Please obtain BMP/CBC on 12/22  Discharge Condition: STABLE   CODE STATUS: FULL DIET: renal    Brief Hospitalization Summary: Please see all hospital notes, images, labs for full details of the hospitalization. Admission provider HPI:  63 y.o. female with medical history significant of hypertension, hyperlipidemia, GAD with panic attacks, asthma, CAD, STEMI with PCI, prior DVT on Eliquis , Bipolar disorder, DM 2, neuropathy, ESRD on HD (TTS), atrial fibrillation who presents to the emergency department due to right neck pain around where dialysis catheter is located.  She also complained of intermittent chest pain that comes and goes and that she had similar chest pain several months ago.  She complains of recent cough with greenish phlegm, but denies fever, chills, vomiting, diarrhea.  She missed last dialysis on Saturday (2 days ago) due to pain and not feeling well.   Patient was admitted for hyperkalemia in the setting of missed hemodialysis and was also thought to have some bronchitis and/for community-acquired pneumonia, but procalcitonin is low and there are no significant chest x-ray findings noted.  She is now noted to have some worsening anemia requiring 1 unit PRBC transfusion.   Hospital Course by listed problems   Hyperkalemia -- treated and resolved  Nephrology treated with HD   ESRD on HD (TTS) Patient missed dialysis on Saturday (12/13) Nephrologist consulted by EDP and arranged for patient's dialysis Pt reports she is on holiday HD schedule and will go next on 12/22.   CAP CT suggestive of pneumonia, patient without any symptoms of pneumonia other than productive  cough of green phlegm. Procalcitonin currently low and no significant leukocytosis Pt had more productive and purulent cough Started levofloxacin     Acute on chronic anemia Iron  deficiency anemia   Hemoglobin improved to 7.7 after transfusions 1 unit PRBC transfusion given 12/17 and 12/18 Recheck CBC with HD on 12/22  Hg improved to 7.7  EGD in Oct 2025 was reassuring but no recent colonoscopy GI recommending outpatient colonoscopy  Oral iron  supplement 325 mg daily ordered    History of CAD/STEMI with PCI Continue Plavix , statin, Coreg    Left wrist pain/stiffness -- she is left hand dominant and symptoms concerning for carpal tunnel strain -- pt agreeable to trial for a left wrist splint to wear nightly to relieve carpal tunnel   Type 2 diabetes mellitus, controlled, with renal complications Hemoglobin A1c on 05/06/2024 was 6.5.  Repeat A1c 5.3%  Continue ISS and hypoglycemia protocol CBG (last 3)  Recent Labs    09/02/24 1608 09/02/24 1949 09/03/24 0742  GLUCAP 122* 150* 115*    GAD/Bipolar Continue lamictal , sertraline , klonopin    History of GI bleed Continue Protonix    Obesity, class II BMI 38.04   Pneumonia  - CXR confirmed  - levofloxacin  ordered   Discharge Diagnoses:  Principal Problem:   Hyperkalemia   Discharge Instructions:  Allergies as of 09/03/2024       Reactions   Gelatin Swelling   No jello of any kind   Iodinated Contrast Media Anaphylaxis, Hives, Itching, Swelling   Methylprednisolone  Sodium Succ Shortness Of Breath   Dicyclomine Hcl Anxiety, Other (See Comments)   shaky and sick GI upset, sick to stomach   Doxycycline Hives, Rash  rash   Glutamic Acid Itching, Swelling   Ketorolac Hives, Rash   Tolerated Toradol in the ED without side effect, hives, or complaint on 02/17/2022   Lisinopril  Swelling   Angioedema (facial, lip, or tongue swelling)   Metformin Nausea And Vomiting, Other (See Comments)   Dyspepsia sick to  stomach It made me sick where I couldn't get out of the bed   Nitroglycerin  Nausea And Vomiting, Swelling   Pt states it makes her too sick   Nsaids Other (See Comments)   Stomach pain/bleeding   Other Itching, Swelling, Other (See Comments)   Solu-Medrol  Mix-O-Vial   Quetiapine Other (See Comments)   Vancomycin    Itching and erythema at infusion site within a few minutes of starting infusion. No systemic evidence of Red Man Syndrome   Blueberry Flavoring Agent (non-screening) Nausea And Vomiting, Rash   Cephalexin  Nausea And Vomiting, Nausea Only   sick to stomach   Fish Allergy Rash   Ibuprofen Nausea And Vomiting   GI upset Reports stomach upset 2/2 hx gastric polyps. Not true allergy.   Nitrofuran Derivatives Itching, Nausea And Vomiting   sick to stomach   Shellfish Allergy Itching, Swelling, Rash   Strawberry Flavoring Agent (non-screening) Nausea And Vomiting, Rash   Sulfa Antibiotics Nausea And Vomiting, Other (See Comments)   It just makes me real sick        Medication List     STOP taking these medications    Lantus  SoloStar 100 UNIT/ML Solostar Pen Generic drug: insulin  glargine   NovoLOG  FlexPen 100 UNIT/ML FlexPen Generic drug: insulin  aspart       TAKE these medications    albuterol  108 (90 Base) MCG/ACT inhaler Commonly known as: VENTOLIN  HFA Inhale 2 puffs into the lungs every 6 (six) hours as needed for wheezing or shortness of breath.   amLODipine  10 MG tablet Commonly known as: NORVASC  Take 10 mg by mouth daily.   apixaban  5 MG Tabs tablet Commonly known as: Eliquis  Take 1 tablet (5 mg total) by mouth 2 (two) times daily. Continue hold medication until follow up/clerance by gastroenterology service.   atorvastatin  40 MG tablet Commonly known as: LIPITOR Take 40 mg by mouth every morning.   carvedilol  25 MG tablet Commonly known as: COREG  Take 25 mg by mouth 2 (two) times daily with a meal.   clonazePAM  0.5 MG tablet Commonly  known as: KLONOPIN  Take 1 tablet (0.5 mg total) by mouth 2 (two) times daily as needed for anxiety.   clopidogrel  75 MG tablet Commonly known as: PLAVIX  Take 75 mg by mouth daily.   ergocalciferol 1.25 MG (50000 UT) capsule Commonly known as: VITAMIN D2 Take 50,000 Units by mouth every 30 (thirty) days.   famotidine  20 MG tablet Commonly known as: PEPCID  Take 20 mg by mouth 2 (two) times daily.   gabapentin  300 MG capsule Commonly known as: NEURONTIN  Take 300 mg by mouth 2 (two) times daily.   hydrOXYzine  25 MG tablet Commonly known as: ATARAX  Take 25 mg by mouth every 12 (twelve) hours as needed for anxiety.   Iron (Ferrous Sulfate) 325 (65 Fe) MG Tabs Take 1 tablet by mouth daily before breakfast.   isosorbide  mononitrate 30 MG 24 hr tablet Commonly known as: IMDUR  Take 3 tablets (90 mg total) by mouth every morning.   lamoTRIgine  100 MG tablet Commonly known as: LAMICTAL  Take 100 mg by mouth 2 (two) times daily.   levofloxacin  250 MG tablet Commonly known as: LEVAQUIN  Take 1  tablet (250 mg total) by mouth every other day for 3 doses. Start taking on: September 05, 2024   midodrine 5 MG tablet Commonly known as: PROAMATINE Take 5 mg by mouth 3 (three) times daily with meals.   multivitamin Tabs tablet Take 1 tablet by mouth at bedtime.   ondansetron  4 MG disintegrating tablet Commonly known as: ZOFRAN -ODT Take 2 mg by mouth every 6 (six) hours as needed.   pantoprazole  40 MG tablet Commonly known as: PROTONIX  Take 40 mg by mouth 2 (two) times daily before a meal.   sertraline  100 MG tablet Commonly known as: ZOLOFT  Take 100 mg by mouth daily.   sevelamer  carbonate 800 MG tablet Commonly known as: RENVELA  Take 800 mg by mouth 3 (three) times daily with meals.        Follow-up Information     dialysis Follow up on 09/06/2024.          PCP. Schedule an appointment as soon as possible for a visit in 1 week(s).          [facility]  Gastroenterology at [facility]. Schedule an appointment as soon as possible for a visit in 3 week(s).   Specialty: Gastroenterology Why: Hospital Follow Up Contact information: [address] [place]  [address] [phone]               Allergies[1] Allergies as of 09/03/2024       Reactions   Gelatin Swelling   No jello of any kind   Iodinated Contrast Media Anaphylaxis, Hives, Itching, Swelling   Methylprednisolone  Sodium Succ Shortness Of Breath   Dicyclomine Hcl Anxiety, Other (See Comments)   shaky and sick GI upset, sick to stomach   Doxycycline Hives, Rash   rash   Glutamic Acid Itching, Swelling   Ketorolac Hives, Rash   Tolerated Toradol in the ED without side effect, hives, or complaint on 02/17/2022   Lisinopril  Swelling   Angioedema (facial, lip, or tongue swelling)   Metformin Nausea And Vomiting, Other (See Comments)   Dyspepsia sick to stomach It made me sick where I couldn't get out of the bed   Nitroglycerin  Nausea And Vomiting, Swelling   Pt states it makes her too sick   Nsaids Other (See Comments)   Stomach pain/bleeding   Other Itching, Swelling, Other (See Comments)   Solu-Medrol  Mix-O-Vial   Quetiapine Other (See Comments)   Vancomycin    Itching and erythema at infusion site within a few minutes of starting infusion. No systemic evidence of Red Man Syndrome   Blueberry Flavoring Agent (non-screening) Nausea And Vomiting, Rash   Cephalexin  Nausea And Vomiting, Nausea Only   sick to stomach   Fish Allergy Rash   Ibuprofen Nausea And Vomiting   GI upset Reports stomach upset 2/2 hx gastric polyps. Not true allergy.   Nitrofuran Derivatives Itching, Nausea And Vomiting   sick to stomach   Shellfish Allergy Itching, Swelling, Rash   Strawberry Flavoring Agent (non-screening) Nausea And Vomiting, Rash   Sulfa Antibiotics Nausea And Vomiting, Other (See Comments)   It just makes me real sick        Medication  List     STOP taking these medications    Lantus  SoloStar 100 UNIT/ML Solostar Pen Generic drug: insulin  glargine   NovoLOG  FlexPen 100 UNIT/ML FlexPen Generic drug: insulin  aspart       TAKE these medications    albuterol  108 (90 Base) MCG/ACT inhaler Commonly known as: VENTOLIN  HFA Inhale 2  puffs into the lungs every 6 (six) hours as needed for wheezing or shortness of breath.   amLODipine  10 MG tablet Commonly known as: NORVASC  Take 10 mg by mouth daily.   apixaban  5 MG Tabs tablet Commonly known as: Eliquis  Take 1 tablet (5 mg total) by mouth 2 (two) times daily. Continue hold medication until follow up/clerance by gastroenterology service.   atorvastatin  40 MG tablet Commonly known as: LIPITOR Take 40 mg by mouth every morning.   carvedilol  25 MG tablet Commonly known as: COREG  Take 25 mg by mouth 2 (two) times daily with a meal.   clonazePAM  0.5 MG tablet Commonly known as: KLONOPIN  Take 1 tablet (0.5 mg total) by mouth 2 (two) times daily as needed for anxiety.   clopidogrel  75 MG tablet Commonly known as: PLAVIX  Take 75 mg by mouth daily.   ergocalciferol 1.25 MG (50000 UT) capsule Commonly known as: VITAMIN D2 Take 50,000 Units by mouth every 30 (thirty) days.   famotidine  20 MG tablet Commonly known as: PEPCID  Take 20 mg by mouth 2 (two) times daily.   gabapentin  300 MG capsule Commonly known as: NEURONTIN  Take 300 mg by mouth 2 (two) times daily.   hydrOXYzine  25 MG tablet Commonly known as: ATARAX  Take 25 mg by mouth every 12 (twelve) hours as needed for anxiety.   Iron (Ferrous Sulfate) 325 (65 Fe) MG Tabs Take 1 tablet by mouth daily before breakfast.   isosorbide  mononitrate 30 MG 24 hr tablet Commonly known as: IMDUR  Take 3 tablets (90 mg total) by mouth every morning.   lamoTRIgine  100 MG tablet Commonly known as: LAMICTAL  Take 100 mg by mouth 2 (two) times daily.   levofloxacin  250 MG tablet Commonly known as: LEVAQUIN  Take 1  tablet (250 mg total) by mouth every other day for 3 doses. Start taking on: September 05, 2024   midodrine 5 MG tablet Commonly known as: PROAMATINE Take 5 mg by mouth 3 (three) times daily with meals.   multivitamin Tabs tablet Take 1 tablet by mouth at bedtime.   ondansetron  4 MG disintegrating tablet Commonly known as: ZOFRAN -ODT Take 2 mg by mouth every 6 (six) hours as needed.   pantoprazole  40 MG tablet Commonly known as: PROTONIX  Take 40 mg by mouth 2 (two) times daily before a meal.   sertraline  100 MG tablet Commonly known as: ZOLOFT  Take 100 mg by mouth daily.   sevelamer  carbonate 800 MG tablet Commonly known as: RENVELA  Take 800 mg by mouth 3 (three) times daily with meals.        Procedures/Studies: DG CHEST PORT 1 VIEW Result Date: 09/02/2024 EXAM: 1 VIEW(S) XRAY OF THE CHEST 09/02/2024 02:34:00 PM COMPARISON: 08/30/2024 CLINICAL HISTORY: Chest congestion; Cough FINDINGS: LINES, TUBES AND DEVICES: Stable right chest wall dialysis catheter. LUNGS AND PLEURA: New left basilar airspace opacity. Opacities in the right upper lobe medially. No pleural effusion. No pneumothorax. HEART AND MEDIASTINUM: No acute abnormality of the cardiac and mediastinal silhouettes. BONES AND SOFT TISSUES: No acute osseous abnormality. IMPRESSION: 1. New left basilar and medial right upper lobe airspace opacities worrisome for multifocal pneumonia. Recommend follow-up chest x-ray in 4 to 6 weeks to confirm resolution . 2. Stable right chest wall dialysis catheter. Electronically signed by: Greig Pique MD 09/02/2024 07:55 PM EST RP Workstation: [id number]   CT ABDOMEN PELVIS WO CONTRAST Result Date: 08/30/2024 EXAM: CT ABDOMEN AND PELVIS WITHOUT CONTRAST 08/30/2024 08:42:14 PM TECHNIQUE: CT of the abdomen and pelvis was performed without the administration of  intravenous contrast. Multiplanar reformatted images are provided for review. Automated exposure control, iterative reconstruction,  and/or weight-based adjustment of the mA/kV was utilized to reduce the radiation dose to as low as reasonably achievable. COMPARISON: 06/26/2024 CLINICAL HISTORY: Abdominal pain, acute, nonlocalized. FINDINGS: LOWER CHEST: Patchy left lower lobe opacity, suspicious for pneumonia. Moderate 3-vessel coronary atherosclerosis. LIVER: The liver is unremarkable. GALLBLADDER AND BILE DUCTS: Status post cholecystectomy. No biliary ductal dilatation. SPLEEN: No acute abnormality. PANCREAS: No acute abnormality. ADRENAL GLANDS: No acute abnormality. KIDNEYS, URETERS AND BLADDER: Punctate nonobstructing right lower pole renal calculus. No stones in the ureters. No hydronephrosis. No perinephric or periureteral stranding. Urinary bladder is unremarkable. GI AND BOWEL: Stomach demonstrates no acute abnormality. Status post appendectomy. There is no bowel obstruction. PERITONEUM AND RETROPERITONEUM: Mesenteric stranding along the anterior mid abdomen (image 52), progressive from recent priors, new from 2024. This appearance favors sclerosing mesenteritis. No ascites. No free air. VASCULATURE: Atherosclerotic calcifications of the abdominal aorta and branch vessels. LYMPH NODES: No discrete lymphadenopathy. REPRODUCTIVE ORGANS: Status post hysterectomy. BONES AND SOFT TISSUES: No acute osseous abnormality. No focal soft tissue abnormality. IMPRESSION: 1. 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 2. Otherwise, no acute findings in the abdomen/pelvis. 3. Patchy left lower lobe opacity, suspicious for pneumonia. Electronically signed by: Pinkie Pebbles MD 08/30/2024 09:07 PM EST RP Workstation: [id number]   DG Chest Portable 1 View Result Date: 08/30/2024 EXAM: 1 VIEW(S) XRAY OF THE CHEST 08/30/2024 07:29:00 PM COMPARISON: 07/01/2024 CLINICAL HISTORY: chest pain FINDINGS: LINES, TUBES AND DEVICES: Stable right chest dialysis catheter with distal tip ending at the  cavoatrial junction. LUNGS AND PLEURA: Stable chronically low lung volumes. No focal pulmonary opacity. No pleural effusion. No pneumothorax. HEART AND MEDIASTINUM: The distal tip of the right chest dialysis catheter is at the cavoatrial junction. No acute abnormality of the cardiac and mediastinal silhouettes. BONES AND SOFT TISSUES: No acute osseous abnormality. IMPRESSION: 1. No acute cardiopulmonary findings. 2. Stable right chest dialysis catheter with tip at the cavoatrial junction. Electronically signed by: Greig Pique MD 08/30/2024 07:38 PM EST RP Workstation: [id number]     Subjective: Pt report that she is feeling well and would like to go home today, she says she understands her dialysis instructions and she will go on Monday.   Discharge Exam: Vitals:   09/03/24 1357 09/03/24 1405  BP: (!) 102/47 (!) 102/47  Pulse: 83 83  Resp: 18   Temp: 99 F (37.2 C)   SpO2: 95%    Vitals:   09/03/24 1251 09/03/24 1257 09/03/24 1357 09/03/24 1405  BP: (!) 113/48 (!) 129/57 (!) 102/47 (!) 102/47  Pulse: 78 78 83 83  Resp: 15 (!) 21 18   Temp:  99.4 F (37.4 C) 99 F (37.2 C)   TempSrc:   Oral   SpO2: 94% 94% 95%   Weight:  95.8 kg    Height:       General: Pt is alert, awake, not in acute distress Cardiovascular: normal S1/S2 +, no rubs, no gallops Respiratory: CTA bilaterally, no wheezing, no rhonchi Abdominal: Soft, NT, ND, bowel sounds + Extremities: trace LE edema, no cyanosis   The results of significant diagnostics from this hospitalization (including imaging, microbiology, ancillary and laboratory) are listed below for reference.     Microbiology: Recent Results (from the past 240 hours)  MRSA Next Gen by PCR, Nasal     Status: None   Collection Time: 08/31/24  2:33 AM   Specimen: Nasal Mucosa;  Nasal Swab  Result Value Ref Range Status   MRSA by PCR Next Gen NOT DETECTED NOT DETECTED Final    Comment: (NOTE) The GeneXpert MRSA Assay (FDA approved for NASAL specimens  only), is one component of a comprehensive MRSA colonization surveillance program. It is not intended to diagnose MRSA infection nor to guide or monitor treatment for MRSA infections. Test performance is not FDA approved in patients less than 54 years old. Performed at [facility], [address] [place], [place] [address]      Labs: BNP (last 3 results) No results for input(s): BNP in the last 8760 hours. Basic Metabolic Panel: Recent Labs  Lab 08/30/24 2004 08/31/24 0501 09/01/24 0408 09/02/24 0405 09/03/24 0409  NA 133* 135 133* 133* 132*  K 5.7* 5.6* 5.0 4.3 4.6  CL 96* 97* 95* 93* 92*  CO2 20* 28 29 30 28   GLUCOSE 120* 81 123* 124* 116*  BUN 43* 43* 40* 24* 36*  CREATININE 9.19* 9.97* 8.25* 5.76* 7.64*  CALCIUM  8.9 8.7* 8.7* 8.6* 8.9  MG  --  1.9 1.9  --   --   PHOS  --  5.5*  --  3.8 5.5*   Liver Function Tests: Recent Labs  Lab 08/30/24 2004 08/31/24 0501 09/02/24 0405 09/03/24 0409  AST 12* <10*  --   --   ALT 6 6  --   --   ALKPHOS 70 62  --   --   BILITOT 0.6 0.5  --   --   PROT 9.3* 8.5*  --   --   ALBUMIN  3.5 3.3* 3.0* 3.0*   Recent Labs  Lab 08/30/24 2004  LIPASE 45   No results for input(s): AMMONIA in the last 168 hours. CBC: Recent Labs  Lab 08/30/24 2004 08/31/24 0501 08/31/24 0703 09/01/24 0408 09/02/24 0405 09/02/24 1902 09/03/24 0409  WBC 6.6 5.9  --  6.4 7.4  --  7.5  NEUTROABS 4.8  --   --   --   --   --   --   HGB 7.2* 6.2* 6.4* 6.3* 6.8* 8.2* 7.7*  HCT 22.4* 19.3* 20.1* 19.8* 20.8* 25.3* 23.5*  MCV 94.9 95.1  --  95.7 91.6  --  91.4  PLT 192 192  --  189 170  --  172   Cardiac Enzymes: No results for input(s): CKTOTAL, CKMB, CKMBINDEX, TROPONINI in the last 168 hours. BNP: Invalid input(s): POCBNP CBG: Recent Labs  Lab 09/02/24 0722 09/02/24 1114 09/02/24 1608 09/02/24 1949 09/03/24 0742  GLUCAP 119* 111* 122* 150* 115*   D-Dimer No results for input(s): DDIMER in the last 72 hours. Hgb  A1c No results for input(s): HGBA1C in the last 72 hours. Lipid Profile No results for input(s): CHOL, HDL, LDLCALC, TRIG, CHOLHDL, LDLDIRECT in the last 72 hours. Thyroid function studies No results for input(s): TSH, T4TOTAL, T3FREE, THYROIDAB in the last 72 hours.  Invalid input(s): FREET3 Anemia work up Recent Labs    09/01/24 1423 09/02/24 0405  VITAMINB12 384  --   FOLATE 6.5  --   TIBC  --  165*  IRON  --  17*   Urinalysis    Component Value Date/Time   COLORURINE YELLOW 06/26/2024 0152   APPEARANCEUR CLOUDY (A) 06/26/2024 0152   LABSPEC 1.012 06/26/2024 0152   PHURINE 5.0 06/26/2024 0152   GLUCOSEU NEGATIVE 06/26/2024 0152   HGBUR SMALL (A) 06/26/2024 0152   BILIRUBINUR NEGATIVE 06/26/2024 0152   KETONESUR NEGATIVE 06/26/2024 0152  PROTEINUR 100 (A) 06/26/2024 0152   UROBILINOGEN 0.2 12/17/2011 2258   NITRITE NEGATIVE 06/26/2024 0152   LEUKOCYTESUR TRACE (A) 06/26/2024 0152   Sepsis Labs Recent Labs  Lab 08/31/24 0501 09/01/24 0408 09/02/24 0405 09/03/24 0409  WBC 5.9 6.4 7.4 7.5   Microbiology Recent Results (from the past 240 hours)  MRSA Next Gen by PCR, Nasal     Status: None   Collection Time: 08/31/24  2:33 AM   Specimen: Nasal Mucosa; Nasal Swab  Result Value Ref Range Status   MRSA by PCR Next Gen NOT DETECTED NOT DETECTED Final    Comment: (NOTE) The GeneXpert MRSA Assay (FDA approved for NASAL specimens only), is one component of a comprehensive MRSA colonization surveillance program. It is not intended to diagnose MRSA infection nor to guide or monitor treatment for MRSA infections. Test performance is not FDA approved in patients less than 46 years old. Performed at [facility], [address] [place], [place] [address]    Time coordinating discharge: 33 mins  SIGNED:  Afton Louder, MD  [facility] 09/03/2024, 2:58 PM How to contact the [facility] Attending or Consulting provider 7A - 7P or covering  provider during after hours 7P -7A, for this patient?  Check the care team in [facility] and look for a) attending/consulting [facility] provider listed and b) the [facility] team listed Log into [email or web address] and use [facility] universal password to access. If you do not have the password, please contact the hospital operator. Locate the [facility] provider you are looking for under [facility] and page to a number that you can be directly reached. If you still have difficulty reaching the provider, please page the [facility] (Director on Call) for the Hospitalists listed on amion for assistance.     [1]  Allergies Allergen Reactions   Gelatin Swelling    No jello of any kind   Iodinated Contrast Media Anaphylaxis, Hives, Itching and Swelling   Methylprednisolone  Sodium Succ Shortness Of Breath   Dicyclomine Hcl Anxiety and Other (See Comments)    shaky and sick GI upset, sick to stomach   Doxycycline Hives and Rash    rash   Glutamic Acid Itching and Swelling   Ketorolac Hives and Rash    Tolerated Toradol in the ED without side effect, hives, or complaint on 02/17/2022   Lisinopril  Swelling    Angioedema (facial, lip, or tongue swelling)   Metformin Nausea And Vomiting and Other (See Comments)    Dyspepsia sick to stomach It made me sick where I couldn't get out of the bed   Nitroglycerin  Nausea And Vomiting and Swelling    Pt states it makes her too sick   Nsaids Other (See Comments)    Stomach pain/bleeding   Other Itching, Swelling and Other (See Comments)    Solu-Medrol  Mix-O-Vial   Quetiapine Other (See Comments)   Vancomycin     Itching and erythema at infusion site within a few minutes of starting infusion. No systemic evidence of Red Man Syndrome   Blueberry Flavoring Agent (Non-Screening) Nausea And Vomiting and Rash   Cephalexin  Nausea And Vomiting and Nausea Only    sick to stomach   Fish Allergy Rash   Ibuprofen Nausea And Vomiting    GI upset Reports stomach upset 2/2 hx  gastric polyps. Not true allergy.   Nitrofuran Derivatives Itching and Nausea And Vomiting    sick to stomach   Shellfish Allergy Itching, Swelling and Rash   Strawberry Flavoring Agent (Non-Screening) Nausea  And Vomiting and Rash   Sulfa Antibiotics Nausea And Vomiting and Other (See Comments)    It just makes me real sick

## 2024-09-06 NOTE — Progress Notes (Signed)
 D/c over weekend noted. Contacted out-pt HD clinic, [name], and informed of pt d/c and anticipated arrival back to clinic on 12/22. D/c summary and last nephrology note have been faxed over at this time. No further support is needed.    Lavanda Emmauel Hallums Dialysis Navigator [phone]

## 2024-09-06 NOTE — Telephone Encounter (Signed)
 Dismissed From [facility] Gastroenterology at [facility] Reason: Discharged from Practice Effective: 06/09/2012
